# Patient Record
Sex: Female | Born: 1945
Health system: Southern US, Community
[De-identification: ages and names within clinical notes are randomized; demographics above are authoritative.]

## PROBLEM LIST (undated history)

## (undated) DIAGNOSIS — Z87442 Personal history of urinary calculi: Secondary | ICD-10-CM

## (undated) DIAGNOSIS — T4145XA Adverse effect of unspecified anesthetic, initial encounter: Secondary | ICD-10-CM

## (undated) DIAGNOSIS — Z860100 Personal history of colon polyps, unspecified: Secondary | ICD-10-CM

## (undated) DIAGNOSIS — J45909 Unspecified asthma, uncomplicated: Secondary | ICD-10-CM

## (undated) DIAGNOSIS — C4490 Unspecified malignant neoplasm of skin, unspecified: Secondary | ICD-10-CM

## (undated) DIAGNOSIS — C189 Malignant neoplasm of colon, unspecified: Secondary | ICD-10-CM

## (undated) DIAGNOSIS — Z1501 Genetic susceptibility to malignant neoplasm of breast: Secondary | ICD-10-CM

## (undated) DIAGNOSIS — H9190 Unspecified hearing loss, unspecified ear: Secondary | ICD-10-CM

## (undated) DIAGNOSIS — E119 Type 2 diabetes mellitus without complications: Secondary | ICD-10-CM

## (undated) DIAGNOSIS — Z79890 Hormone replacement therapy (postmenopausal): Secondary | ICD-10-CM

## (undated) DIAGNOSIS — H269 Unspecified cataract: Secondary | ICD-10-CM

## (undated) DIAGNOSIS — R51 Headache: Secondary | ICD-10-CM

## (undated) DIAGNOSIS — R011 Cardiac murmur, unspecified: Secondary | ICD-10-CM

## (undated) DIAGNOSIS — Z8041 Family history of malignant neoplasm of ovary: Secondary | ICD-10-CM

## (undated) DIAGNOSIS — Z803 Family history of malignant neoplasm of breast: Secondary | ICD-10-CM

## (undated) DIAGNOSIS — S92902A Unspecified fracture of left foot, initial encounter for closed fracture: Secondary | ICD-10-CM

## (undated) DIAGNOSIS — Z8781 Personal history of (healed) traumatic fracture: Secondary | ICD-10-CM

## (undated) DIAGNOSIS — Z1502 Genetic susceptibility to malignant neoplasm of ovary: Secondary | ICD-10-CM

## (undated) DIAGNOSIS — T8859XA Other complications of anesthesia, initial encounter: Secondary | ICD-10-CM

## (undated) DIAGNOSIS — Z8639 Personal history of other endocrine, nutritional and metabolic disease: Secondary | ICD-10-CM

## (undated) DIAGNOSIS — Z9221 Personal history of antineoplastic chemotherapy: Secondary | ICD-10-CM

## (undated) HISTORY — DX: Cardiac murmur, unspecified: R01.1

## (undated) HISTORY — DX: Unspecified hearing loss, unspecified ear: H91.90

## (undated) HISTORY — DX: Personal history of colon polyps, unspecified: Z86.0100

## (undated) HISTORY — DX: Family history of malignant neoplasm of breast: Z80.3

## (undated) HISTORY — DX: Personal history of (healed) traumatic fracture: Z87.81

## (undated) HISTORY — DX: Malignant neoplasm of colon, unspecified: C18.9

## (undated) HISTORY — DX: Hormone replacement therapy: Z79.890

## (undated) HISTORY — DX: Personal history of urinary calculi: Z87.442

## (undated) HISTORY — DX: Family history of malignant neoplasm of ovary: Z80.41

## (undated) HISTORY — DX: Unspecified fracture of left foot, initial encounter for closed fracture: S92.902A

## (undated) HISTORY — DX: Personal history of antineoplastic chemotherapy: Z92.21

## (undated) HISTORY — PX: COSMETIC SURGERY: SHX468

## (undated) HISTORY — DX: Genetic susceptibility to malignant neoplasm of breast: Z15.01

## (undated) HISTORY — DX: Personal history of other endocrine, nutritional and metabolic disease: Z86.39

## (undated) HISTORY — DX: Personal history of colonic polyps: Z86.010

## (undated) HISTORY — DX: Unspecified cataract: H26.9

## (undated) HISTORY — PX: OTHER SURGICAL HISTORY: SHX169

## (undated) HISTORY — DX: Genetic susceptibility to malignant neoplasm of ovary: Z15.02

---
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
## 1975-03-31 HISTORY — PX: DILATION AND CURETTAGE OF UTERUS: SHX78

## 1983-03-31 DIAGNOSIS — Z8781 Personal history of (healed) traumatic fracture: Secondary | ICD-10-CM

## 1983-03-31 HISTORY — PX: OTHER SURGICAL HISTORY: SHX169

## 1983-03-31 HISTORY — DX: Personal history of (healed) traumatic fracture: Z87.81

## 1991-03-31 HISTORY — PX: ANTERIOR CRUCIATE LIGAMENT REPAIR: SHX115

## 1999-04-23 ENCOUNTER — Other Ambulatory Visit: Admission: RE | Admit: 1999-04-23 | Discharge: 1999-04-23 | Payer: Self-pay | Admitting: Plastic Surgery

## 1999-04-23 ENCOUNTER — Encounter (INDEPENDENT_AMBULATORY_CARE_PROVIDER_SITE_OTHER): Payer: Self-pay | Admitting: Specialist

## 1999-07-07 ENCOUNTER — Other Ambulatory Visit: Admission: RE | Admit: 1999-07-07 | Discharge: 1999-07-07 | Payer: Self-pay | Admitting: Obstetrics and Gynecology

## 2000-08-18 ENCOUNTER — Ambulatory Visit (HOSPITAL_BASED_OUTPATIENT_CLINIC_OR_DEPARTMENT_OTHER): Admission: RE | Admit: 2000-08-18 | Discharge: 2000-08-18 | Payer: Self-pay | Admitting: Plastic Surgery

## 2000-08-19 ENCOUNTER — Other Ambulatory Visit: Admission: RE | Admit: 2000-08-19 | Discharge: 2000-08-19 | Payer: Self-pay | Admitting: Obstetrics and Gynecology

## 2001-03-20 ENCOUNTER — Encounter: Payer: Self-pay | Admitting: Internal Medicine

## 2001-09-05 ENCOUNTER — Other Ambulatory Visit: Admission: RE | Admit: 2001-09-05 | Discharge: 2001-09-05 | Payer: Self-pay | Admitting: Obstetrics and Gynecology

## 2002-12-11 ENCOUNTER — Other Ambulatory Visit: Admission: RE | Admit: 2002-12-11 | Discharge: 2002-12-11 | Payer: Self-pay | Admitting: Obstetrics and Gynecology

## 2003-03-31 HISTORY — PX: TOTAL ABDOMINAL HYSTERECTOMY: SHX209

## 2003-06-05 ENCOUNTER — Observation Stay (HOSPITAL_COMMUNITY): Admission: RE | Admit: 2003-06-05 | Discharge: 2003-06-06 | Payer: Self-pay | Admitting: Obstetrics and Gynecology

## 2003-06-05 ENCOUNTER — Encounter: Payer: Self-pay | Admitting: Internal Medicine

## 2003-06-05 ENCOUNTER — Encounter (INDEPENDENT_AMBULATORY_CARE_PROVIDER_SITE_OTHER): Payer: Self-pay | Admitting: *Deleted

## 2005-11-10 ENCOUNTER — Emergency Department (HOSPITAL_COMMUNITY): Admission: EM | Admit: 2005-11-10 | Discharge: 2005-11-10 | Payer: Self-pay | Admitting: Emergency Medicine

## 2008-09-01 ENCOUNTER — Emergency Department (HOSPITAL_COMMUNITY): Admission: EM | Admit: 2008-09-01 | Discharge: 2008-09-01 | Payer: Self-pay | Admitting: Emergency Medicine

## 2009-06-20 ENCOUNTER — Encounter: Payer: Self-pay | Admitting: Internal Medicine

## 2009-11-18 ENCOUNTER — Encounter (INDEPENDENT_AMBULATORY_CARE_PROVIDER_SITE_OTHER): Payer: Self-pay | Admitting: *Deleted

## 2010-01-20 ENCOUNTER — Ambulatory Visit: Payer: Self-pay | Admitting: Internal Medicine

## 2010-01-20 ENCOUNTER — Encounter (INDEPENDENT_AMBULATORY_CARE_PROVIDER_SITE_OTHER): Payer: Self-pay | Admitting: *Deleted

## 2010-03-04 ENCOUNTER — Ambulatory Visit: Payer: Self-pay | Admitting: Internal Medicine

## 2010-03-07 ENCOUNTER — Encounter: Payer: Self-pay | Admitting: Internal Medicine

## 2010-05-01 NOTE — Letter (Signed)
Summary: Pt's Medical Hx/Patient  Pt's Medical Hx/Patient   Imported By: Sherian Rein 01/27/2010 08:32:46  _____________________________________________________________________  External Attachment:    Type:   Image     Comment:   External Document  Appended Document: Colonoscopy   Colonoscopy  Procedure date:  03/04/2010  Findings:          1) Three polyps removed, largest 8 mm.     2) Moderate diverticulosis in the sigmoid colon     3) Otherwise normal examination   1. Colon, polyp(s), transverse and descending :  - TUBULAR ADENOMA (TWO FRAGMENTS) - HYPERPLASTIC POLYPS. - NO HIGH GRADE DYSPLASIA OR MALIGNANCY.  Comments:      Repeat colonoscopy in 5 years.   Procedures Next Due Date:    Colonoscopy: 02/2015   Appended Document: Colonoscopy     Procedures Next Due Date:    Colonoscopy: 02/2015

## 2010-05-01 NOTE — Letter (Signed)
Summary: New Patient letter  [facility]  [address] [place], [place] [address]   Phone: [phone]  Fax: [phone]       11/18/2009 MRN: [id number]  Anne [name] [address] [place], [place]  [address]  Dear Ms. Tooker,  Welcome to the Gastroenterology Division at [facility].    You are scheduled to see Dr.  Leone Payor on 01-20-10 at 9:00a.m. on the 3rd floor at [facility], [address]  We ask that you try to arrive at our office 15 minutes prior to your appointment time to allow for check-in.  We would like you to complete the enclosed self-administered evaluation form prior to your visit and bring it with you on the day of your appointment.  We will review it with you.  Also, please bring a complete list of all your medications or, if you prefer, bring the medication bottles and we will list them.  Please bring your insurance card so that we may make a copy of it.  If your insurance requires a referral to see a specialist, please bring your referral form from your primary care physician.  Co-payments are due at the time of your visit and may be paid by cash, check or credit card.     Your office visit will consist of a consult with your physician (includes a physical exam), any laboratory testing he/she may order, scheduling of any necessary diagnostic testing (e.g. x-ray, ultrasound, CT-scan), and scheduling of a procedure (e.g. Endoscopy, Colonoscopy) if required.  Please allow enough time on your schedule to allow for any/all of these possibilities.    If you cannot keep your appointment, please call [phone] to cancel or reschedule prior to your appointment date.  This allows [place] the opportunity to schedule an appointment for another patient in need of care.  If you do not cancel or reschedule by 5 p.m. the business day prior to your appointment date, you will be charged a $50.00 late cancellation/no-show fee.    Thank you for choosing  [facility] for your medical needs.  We appreciate the opportunity to care for you.  Please visit [place] at our website  to learn more about our practice.                     Sincerely,                                                             The Gastroenterology Division

## 2010-05-01 NOTE — Letter (Signed)
Summary: Patient Notice- Polyp Results  [facility]  [address] [place], [place] [address]   Phone: [phone]  Fax: [phone]        March 07, 2010 MRN: [id number]    [name] [address] [place], [place]  [address]    Dear Anne [name],  Two of the three polyps removed from your colon were adenomatous. This means that they were pre-cancerous or that  they had the potential to change into cancer over time. One polyp was hyperplastic and generally does not have cancer potential.  I recommend that you have a repeat colonoscopy in 5 years to determine if you have developed any new polyps over time and screen for colorectal cancer. If you develop any new rectal bleeding, abdominal pain or significant bowel habit changes, please contact us before then.  Please call us if you are having persistent problems or have questions about your condition that have not been fully answered at this time.  Sincerely,  Ashley Murrain MD, [facility]  This letter has been electronically signed by your physician.  Appended Document: Patient Notice- Polyp Results Letter mailed

## 2010-05-01 NOTE — Letter (Signed)
Summary: [facility] Instructions  [facility]  [address] [place], [place] [address]   Phone: [phone]  Fax: [phone]       CLEO SANTUCCI    1945/11/23    MRN: [id number]      Procedure Day Dorna Bloom: Jake Shark, 03/04/10     Arrival Time: 8:30 AM      Procedure Time: 9:30 AM    Location of Procedure:                    _X_  [name] (4th Floor)                    PREPARATION FOR COLONOSCOPY WITH MOVIPREP   Starting 5 days prior to your procedure 02/27/10 do not eat nuts, seeds, popcorn, corn, beans, peas,  salads, or any raw vegetables.  Do not take any fiber supplements (e.g. Metamucil, Citrucel, and Benefiber).  THE DAY BEFORE YOUR PROCEDURE         MONDAY, 03/03/10  1.  Drink clear liquids the entire day-NO SOLID FOOD  2.  Do not drink anything colored red or purple.  Avoid juices with pulp.  No orange juice.  3.  Drink at least 64 oz. (8 glasses) of fluid/clear liquids during the day to prevent dehydration and help the prep work efficiently.  CLEAR LIQUIDS INCLUDE: Water Jello Ice Popsicles Tea (sugar ok, no milk/cream) Powdered fruit flavored drinks Coffee (sugar ok, no milk/cream) Gatorade Juice: apple, white grape, white cranberry  Lemonade Clear bullion, consomm, broth Carbonated beverages (any kind) Strained chicken noodle soup Hard Candy                          4.  In the morning, mix first dose of MoviPrep solution:    Empty 1 Pouch A and 1 Pouch B into the disposable container    Add lukewarm drinking water to the top line of the container. Mix to dissolve    Refrigerate (mixed solution should be used within 24 hrs)  5.  Begin drinking the prep at 5:00 p.m. The MoviPrep container is divided by 4 marks.   Every 15 minutes drink the solution down to the next mark (approximately 8 oz) until the full liter is complete.   6.  Follow completed prep with 16 oz of clear liquid of your choice (Nothing red or purple).  Continue  to drink clear liquids.  7.  Mix second dose of MoviPrep solution:    Empty 1 Pouch A and 1 Pouch B into the disposable container    Add lukewarm drinking water to the top line of the container. Mix to dissolve    Refrigerate  Beginning at 9:00 p.m. (5 hours before procedure):         1. Every 15 minutes, drink the solution down to the next mark (approx 8 oz) until the full liter is complete.         2. Follow completed prep with 16 oz. of clear liquid of your choice.  THE DAY OF YOUR PROCEDURE      TUESDAY, 03/04/10   1. You may drink clear liquids until 7:30 AM (2 HOURS BEFORE PROCEDURE).  MEDICATION INSTRUCTIONS  Unless otherwise instructed, you should take regular prescription medications with a small sip of water   as early as possible the morning of your procedure.       OTHER INSTRUCTIONS  You will need a responsible  adult at least 67 years of age to accompany you and drive you home.   This person must remain in the waiting room during your procedure.  Wear loose fitting clothing that is easily removed.  Leave jewelry and other valuables at home.  However, you may wish to bring a book to read or  an iPod/MP3 player to listen to music as you wait for your procedure to start.  Remove all body piercing jewelry and leave at home.  Total time from sign-in until discharge is approximately 2-3 hours.  You should go home directly after your procedure and rest.  You can resume normal activities the  day after your procedure.  The day of your procedure you should not:   Drive   Make legal decisions   Operate machinery   Drink alcohol   Return to work  You will receive specific instructions about eating, activities and medications before you leave.   The above instructions have been reviewed and explained to me by   Francee Piccolo, CMA (AAMA)    I fully understand and can verbalize these instructions _____________________________ Date 01/20/10

## 2010-05-01 NOTE — Procedures (Signed)
Summary: Colonoscopy  Patient: [name] Note: All result statuses are Final unless otherwise noted.  Tests: (1) Colonoscopy (COL)   COL Colonoscopy           DONE     [facility]     [address]     [place], [place]  [address]           COLONOSCOPY PROCEDURE REPORT           PATIENT:  Anne [name], Anne [name]  MR#:  [id number]     BIRTHDATE:  Jul 29, 1945, 63 yrs. old  GENDER:  female     ENDOSCOPIST:  Iva Boop, MD, [facility]           PROCEDURE DATE:  03/04/2010     PROCEDURE:  Colonoscopy with snare polypectomy     ASA CLASS:  Class I     INDICATIONS:  Routine Risk Screening     MEDICATIONS:   Fentanyl 100 mcg IV Valium 10 milligrams IV           DESCRIPTION OF PROCEDURE:   After the risks benefits and     alternatives of the procedure were thoroughly explained, informed     consent was obtained.  Digital rectal exam was performed and     revealed no abnormalities.   The LB PCF-[id number] endoscope     was introduced through the anus and advanced to the cecum, which     was identified by both the appendix and ileocecal valve, without     limitations.  The quality of the prep was adequate, using     MoviPrep.  The instrument was then slowly withdrawn as the colon     was fully examined. Insertion: 11:47 minutes Withdrawal: 19:00     minutes     <<PROCEDUREIMAGES>>           FINDINGS:  Three polyps were found. Two transverse (8 mm and 4mm)     and descending (5mm). Polyps were snared without cautery.     Retrieval was successful. snare polyp  Moderate diverticulosis was     found in the sigmoid colon.  This was otherwise a normal     examination of the colon. Includes right colon retroflexion.     Retroflexed views in the rectum revealed no abnormalities.    The     scope was then withdrawn from the patient and the procedure     completed.           COMPLICATIONS:  None     ENDOSCOPIC IMPRESSION:     1) Three polyps removed, largest 8 mm.     2) Moderate  diverticulosis in the sigmoid colon     3) Otherwise normal examination           REPEAT EXAM:  In for Colonoscopy, pending biopsy results.           Iva Boop, MD, [facility]           CC:  The Patient     Floyde Parkins, MD           n.     Rosalie Doctor:   Iva Boop at 03/04/2010 10:56 AM           Virgel Paling, [id number]  Note: An exclamation mark (!) indicates a result that was not dispersed into the flowsheet. Document Creation Date: 03/04/2010 10:57 AM _______________________________________________________________________  (1) Order result status: Final Collection or observation date-time: 03/04/2010 10:48 Requested date-time:  Receipt date-time:  Reported date-time:  Referring Physician:   Ordering Physician: Stan Head [phone]) Specimen Source:  Source: Launa Grill Order Number: [id number] Lab site:   Appended Document: Colonoscopy   Colonoscopy  Procedure date:  03/04/2010  Findings:          1) Three polyps removed, largest 8 mm.     2) Moderate diverticulosis in the sigmoid colon     3) Otherwise normal examination   1. Colon, polyp(s), transverse and descending :  - TUBULAR ADENOMA (TWO FRAGMENTS) - HYPERPLASTIC POLYPS. - NO HIGH GRADE DYSPLASIA OR MALIGNANCY.  Comments:      Repeat colonoscopy in 5 years.   Procedures Next Due Date:    Colonoscopy: 02/2015   Appended Document: Colonoscopy     Procedures Next Due Date:    Colonoscopy: 02/2015

## 2010-05-01 NOTE — Assessment & Plan Note (Signed)
Summary: discuss colon//establish G.I.--ch.   History of Present Illness Visit Type: Initial Visit Primary GI MD: Stan Head MD [facility] Chief Complaint: Screening Colonoscopy. 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 She is here to discuss screening colonoscopy and past side effects from Versed.  She desctribes occasional bloating and constipation which is related to disruption of her routine when she travels.  She developed post-op terror, confusion and memory disturbance with Versed on at least 2 occasions.  Rare reflux.   GI Review of Systems    Reports acid reflux and  bloating.      Denies abdominal pain, belching, chest pain, dysphagia with liquids, dysphagia with solids, heartburn, loss of appetite, nausea, vomiting, vomiting blood, weight loss, and  weight gain.      Reports change in bowel habits and  constipation.     Denies anal fissure, black tarry stools, diarrhea, diverticulosis, fecal incontinence, heme positive stool, hemorrhoids, irritable bowel syndrome, jaundice, light color stool, liver problems, rectal bleeding, and  rectal pain.  Preventive Screening-Counseling & Management  Alcohol-Tobacco     Smoking Status: never      Drug Use:  no.      Current Medications (verified): 1)  Miralax  Powd (Polyethylene Glycol 3350) .... Mix One Capfull in 8 Ounce of Water and Drink 2-3 Times Per Week. 2)  Methyltest-Est Estrogens Hs 1.25-0.625 Mg Tabs (Est Estrogens-Methyltest) .... Take One By Mouth Once Daily 3)  Premarin 0.45 Mg Tabs (Estrogens Conjugated) .... Take One By Mouth Once Daily 4)  Calcium-Vitamin D 600-200 Mg-Unit Tabs (Calcium-Vitamin D) .... Take One By Mouth Two Times A Day 5)  Multivitamins  Tabs (Multiple Vitamin) .... Take One By Mouth Two Times A Day 6)  Allegra Allergy 180 Mg Tabs (Fexofenadine Hcl) .... Take One By Mouth Once Daily 7)  Pataday 0.2 % Soln (Olopatadine Hcl) .... One Drop in Each  Eye  Allergies (verified): 1)  ! Versed 2)  ! * Tallin 3)  ! Neosporin 4)  ! 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 Divorced one daughter and one son. Patient has never smoked.  Alcohol Use - yes 2-3 per week  Daily Caffeine Use 1-2 per day Illicit Drug Use - no Smoking Status:  never Drug Use:  no  Review of Systems       The patient complains of allergy/sinus and heart murmur.         All other ROS negative except as per HPI.   Vital Signs:  Patient profile:   65 year old female Height:      64.5 inches Weight:      176.4 pounds BMI:     29.92 Pulse rate:   60 / minute Pulse rhythm:   regular BP sitting:   150 / 68  (left arm) Cuff size:   regular  Vitals Entered By: Harlow Mares CMA Duncan Dull) (January 20, 2010 8:50 AM)  Physical Exam  General:  Well developed,  well nourished, no acute distress. Additional Exam:  Further exam deferred to pre-colonoscopy exam.  Labs 05/2409: CBC, CMP, Lipids, TSH reviewed K+ 3.1 and glucose 109 otherwise normal  Impression & Recommendations:  Problem # 1:  SPECIAL SCREENING FOR MALIGNANT NEOPLASMS COLON (ICD-V76.51) Assessment New unable to use Versed due to side effects will try Fentanyl, Benadryl, Valium Risks, benefits,and indications of endoscopic procedure(s) were reviewed with the patient and all  questions answered.   Orders: Colonoscopy (Colon)  Patient Instructions: 1)  Please pick up your medications at your pharmacy. MOVIPREP 2)  We will see you at your procedure on 03/04/10. 3)  We will forward a copy of today's note to you when it is complete. 4)  [facility] Patient Information Guide given to patient.  5)  Colonoscopy and Flexible Sigmoidoscopy brochure given.  6)  Copy sent to : Floyde Parkins, MD 7)  The medication list was reviewed and reconciled.  All changed / newly prescribed medications were explained.  A complete medication list was provided to the patient / caregiver. Prescriptions: MOVIPREP 100 GM  SOLR (PEG-KCL-NACL-NASULF-NA ASC-C) As per prep instructions.  #1 x 0   Entered by:   Francee Piccolo CMA (AAMA)   Authorized by:   Iva Boop MD, [facility]   Signed by:   Francee Piccolo CMA (AAMA) on 01/20/2010   Method used:   Electronically to        [facility] (retail)       [address]       [place], [place]  [id number]       Ph: [phone] or [phone]       Fax: [phone]   RxID:   [id number]

## 2010-06-20 ENCOUNTER — Other Ambulatory Visit: Payer: Self-pay | Admitting: Dermatology

## 2010-08-15 NOTE — Op Note (Signed)
NAME:  Anne [name], Anne [name]                         ACCOUNT NO.:  [id number]   MEDICAL RECORD NO.:  [id number]                   PATIENT TYPE:  OBV   LOCATION:  9399                                 FACILITY:  [facility]   PHYSICIAN:  Sherry A. Rosalio Macadamia, M.D.           DATE OF BIRTH:  Mar 10, 1946   DATE OF PROCEDURE:  06/05/2003  DATE OF DISCHARGE:                                 OPERATIVE REPORT   PREOPERATIVE DIAGNOSES:  1. Cystocele.  2. Left hydrosalpinx.   POSTOPERATIVE DIAGNOSES:  1. Third degree cystocele.  2. Pelvic congestion syndrome.   PROCEDURE:  Laparoscopically-assisted vaginal hysterectomy, anterior repair.   SURGEONS:  Sherry A. Rosalio Macadamia, M.D., Lenoard Aden, M.D.   ANESTHESIA:  General.   INDICATIONS:  This is a 65 year old G2, P2-0-0-2, woman who has had a  symptomatic cystocele for several years.  The patient complains of vaginal  pressure and tissue dropping out of the vagina.  The patient has also had a  known left hydrosalpinx, followed since April 2001 by ultrasound.  Therefore, the patient is brought to the operating room for an LAVH and  anterior repair.   FINDINGS:  A normal-sized anteflexed uterus, normal tubes and ovaries.  Large veins throughout the pelvis.   DESCRIPTION OF PROCEDURE:  The patient was brought into the operating room  and given adequate general anesthesia.  She was placed in a dorsal lithotomy  position.  Her abdomen was washed with Hibiclens, the vagina was washed with  Hibiclens and a Foley catheter was placed in the bladder.  The patient was  draped in a sterile fashion.  The subumbilical incision was infiltrated with  0.25% Marcaine.  An incision was made.  The fascia was identified.  It was  grasped and was incised using 0 Vicryl.  A pursestring stitch was taken  around the fascia.  The peritoneum was identified and entered bluntly.  The  Hasson trocar was introduced into the peritoneal cavity and cinched down  with the 0 Vicryl  suture.  Carbon dioxide was insufflated.  Lateral  incisions were made after infiltrating with 0.25% Marcaine and incision made  under direct visualization, 5 mm trocars were placed.  The pelvis was  inspected with the above findings.  Using tripolar cautery, the right  infundibulopelvic ligament was cauterized x3 and cut in the middle.  The  tissues underneath the IP were cauterized and cut over to the round  ligament.  Round ligament was cauterized x3 and cut.  The tissues were  dissected and the right uterine area was cauterized without cutting;  however, there was a small amount of bleeding.  Because of this, re-cautery  was performed.  The same procedure was performed on the left.  Both ureters  had been identified well below this area of surgery.  The bladder flap was  again put in the midline and a small incision made.  This was hydrodissected  with the Nezhat.  The bladder peritoneum was incised on both sides and the  bladder was developed off of the lower uterine segment with some blunt and  sharp dissection.  Then the surgery was moved to the vaginal part.  A  weighted speculum was placed in the vagina.  The cervix was infiltrated with  1% Xylocaine with epinephrine circumferentially.  The cervix was circumcised  and the vaginal mucosa was dissected off of the cervix with blunt  dissection.  The posterior cul-de-sac was entered.  This was opened.  Using  Heaney clamps, the uterosacral ligaments were clamped, cut, and suture  ligated with 0 Vicryl LigaSure.  The posterior cuff was closed with 0 Vicryl  in a running locked stitch for hemostasis.  A weighted speculum was placed  within the space.  The cardinal ligaments were clamped and cauterized with  LigaSure on alternating sides and the tissues were dissected.  The uterus,  tubes, and ovaries were removed in this fashion.  Small bleeders were  present by the uterosacral ligaments.  These were closed with 0 Vicryl in a   figure-of-eight stitch.  Peritoneum was identified and closed with 0 Vicryl  in a pursestring stitch.  The cystocele repair was then performed by  identifying the vaginal mucosa.  It was dissected off of the bladder with  blunt and sharp dissection.  A Kelly plication stitch was taken with 2-0  Vicryl right at the UV junction.  The connective tissue over the bladder was  identified by dissecting the vaginal tissues off the bladder.  These tissues  were then closed with 2-0 Vicryl in mattress-type stitches for good support  to the bladder.  The excess vaginal tissue was excised.  The vaginal mucosa  was closed in the midline with 3-0 chromic in a running locked stitch.  The  vaginal cuff was closed with 0 Vicryl figure-of-eight interrupted stitches.  The vagina was packed with plain gauze with Estrace cream.  The patient was  taken out of the steep Trendelenburg position.  The surgeon's gloves were  changed.  The laparoscope was reintroduced into the abdominal cavity,  pictures were taken.  The pelvis was irrigated.  Adequate hemostasis was  present.  All carbon dioxide was allowed to escape.  The lower instruments  were removed under direct visualization.  The laparoscope was removed.  The  fascia was then closed over a finger with the 0 Vicryl pursestring stitch  that was present, and then the patient was taken out of the dorsal lithotomy  position and prepared for the second part of her procedure, which is an  abdominoplasty with Dr. Stephens November.   COMPLICATIONS:  None.   CONDITION:  Stable.   ESTIMATED BLOOD LOSS:  175 mL.   SPECIMENS:  Uterus, tubes, and ovaries.                                               Sherry A. Rosalio Macadamia, M.D.    SAD/MEDQ  D:  06/05/2003  T:  06/05/2003  Job:  [id number]   cc:   Consuello Bossier., M.D.  [address]  [address]  [place]  [place] [address]  Fax: [phone]

## 2010-08-15 NOTE — Op Note (Signed)
NAME:  Anne [name], Anne [name]                         ACCOUNT NO.:  [id number]   MEDICAL RECORD NO.:  [id number]                   PATIENT TYPE:  OBV   LOCATION:  9399                                 FACILITY:  [facility]   PHYSICIAN:  Consuello Bossier., M.D.         DATE OF BIRTH:  08-Jul-1945   DATE OF PROCEDURE:  06/05/2003  DATE OF DISCHARGE:                                 OPERATIVE REPORT   PREOPERATIVE DIAGNOSIS:  Abdominal elastosis and diastasis recti.   POSTOPERATIVE DIAGNOSIS:  Abdominal elastosis and diastasis recti.   OPERATION:  Abdominoplasty and repair of the diastasis recti.   SURGEON:  Pleas Patricia, M.D.   ANESTHESIA:  General endotracheal.   FINDINGS:  Please see Dr. Cordelia Pen Dickstein's operative note for her  gynecological procedure.  The patient had two trocar entry wounds in the  lower abdomen in the area of skin to be removed, and a lower periumbilical  incision made.  I had marked her off for the planned extended Pfannenstiel  incision.  She had previously been prepped with Betadine and draped  sterilely, but she was all reprepped and redraped.  The lower abdominal  lengthy transverse incision was made and continued down until the rectus and  external oblique fascia were encountered.  The dissection was then continued  at the level just above the fascia up to the umbilicus when this lower  abdominal skin was incised in the midline up to the umbilicus and then  continued upward, particularly in the midline going up toward the  xiphisternum.  Bleeding controlled with the electrocautery unit, and there  was noted to be hemostasis.  There was a diastasis recti measuring 4-5 cm in  diameter, which was repaired by interrupted 0 Prolene sutures placed in a  figure-of-eight fashion.  Two 10 mm Blake drains were placed on either side  of the umbilicus and brought out through separate stab wounds inferiorly.  Monocryl 3-0 sutures were placed in the midline and then a  chevron-shaped  incision made overlying the umbilicus, and it was brought through this  incision and sewn in place with an interrupted subcutaneous 3-0 Monocryl.  Large triangular segments of lower abdominal full-thickness tissue were then  excised, and again bleeding was controlled with electrocautery unit.  The  wound was irrigated with normal saline.  There was noted to be hemostasis.  The lower abdominal and transverse wounds were then closed with interrupted  subcutaneus 3-0 Monocryl, followed by a running subcuticular 4-0 Monocryl.  These wounds were further reinforced by the application of Steri-Strips.  Xeroform, 4 x 8's, ABD, and a Hypafix dressing were then applied.  The  patient tolerated the procedure well, was able to be discharged from the  operating room to the recovery room subsequently to be admitted for  overnight observation.  Consuello Bossier., M.D.    Tery Sanfilippo  D:  06/05/2003  T:  06/05/2003  Job:  [id number]

## 2010-08-29 DIAGNOSIS — Z87442 Personal history of urinary calculi: Secondary | ICD-10-CM

## 2010-08-29 HISTORY — DX: Personal history of urinary calculi: Z87.442

## 2010-11-06 ENCOUNTER — Other Ambulatory Visit: Payer: Self-pay | Admitting: Dermatology

## 2010-12-10 ENCOUNTER — Other Ambulatory Visit: Payer: Self-pay | Admitting: Dermatology

## 2011-02-20 ENCOUNTER — Encounter: Payer: Self-pay | Admitting: Internal Medicine

## 2011-05-14 ENCOUNTER — Other Ambulatory Visit: Payer: Self-pay | Admitting: Dermatology

## 2011-07-01 ENCOUNTER — Encounter: Payer: Self-pay | Admitting: Internal Medicine

## 2011-07-01 ENCOUNTER — Ambulatory Visit (INDEPENDENT_AMBULATORY_CARE_PROVIDER_SITE_OTHER): Payer: 59 | Admitting: Internal Medicine

## 2011-07-01 VITALS — BP 140/86 | HR 98 | Temp 98.7°F | Ht 64.25 in | Wt 166.0 lb

## 2011-07-01 DIAGNOSIS — H9190 Unspecified hearing loss, unspecified ear: Secondary | ICD-10-CM

## 2011-07-01 DIAGNOSIS — Z79890 Hormone replacement therapy (postmenopausal): Secondary | ICD-10-CM

## 2011-07-01 DIAGNOSIS — R079 Chest pain, unspecified: Secondary | ICD-10-CM

## 2011-07-01 DIAGNOSIS — H269 Unspecified cataract: Secondary | ICD-10-CM

## 2011-07-01 DIAGNOSIS — H918X9 Other specified hearing loss, unspecified ear: Secondary | ICD-10-CM

## 2011-07-01 DIAGNOSIS — Z8639 Personal history of other endocrine, nutritional and metabolic disease: Secondary | ICD-10-CM

## 2011-07-01 DIAGNOSIS — Z860100 Personal history of colon polyps, unspecified: Secondary | ICD-10-CM

## 2011-07-01 DIAGNOSIS — R011 Cardiac murmur, unspecified: Secondary | ICD-10-CM

## 2011-07-01 DIAGNOSIS — Z862 Personal history of diseases of the blood and blood-forming organs and certain disorders involving the immune mechanism: Secondary | ICD-10-CM

## 2011-07-01 HISTORY — DX: Personal history of other endocrine, nutritional and metabolic disease: Z86.39

## 2011-07-01 HISTORY — DX: Hormone replacement therapy: Z79.890

## 2011-07-01 HISTORY — DX: Cardiac murmur, unspecified: R01.1

## 2011-07-01 NOTE — Patient Instructions (Addendum)
Continue lifestyle intervention healthy eating and exercise . And metformin.  Will plan  Echo cardiogram and have Dr Daleen Squibb to see you about the murmur and the atypical chest pain.   Will review labs when available.   monitor BP readings to ensure  Below 140/90 .  FU depending on need   6-12 months.

## 2011-07-01 NOTE — Progress Notes (Signed)
Subjective:    Patient ID: Anne [name], female    DOB: 11-19-1945, 66 y.o.   MRN: [id number]  HPI  Patient comes in as new patient visit . Previous care was  Through her specialty care  . She is establishing with PCP. She is generally well and working on  Losing weight   Over time and has been successful so far . She does   Walking;  Gym and 3 x per week  60- 90 minutes .  Diet and yogurt and fruit diet. Recetly her mom has had a fall and  Is in rehab.  Has some concerns about  Some chest discomfort . " Not pain.  "  Left parasternal and ache  Lasts  Minutes.   In ams getting ready for work.   No asociated sx .  And able to exercise  No  associated sx .   Exercise tolerance  .  Hx of heart murmur heard and over 10 years ago had reportedly normal echo.   Bp : Usually 120 over 74  Range.   Has doctors  [facility].  Labs but not back yet    HRT  : On for 10 years.  Per dr Rosalio Macadamia . NOt interested in DCing unless medically necessary  Has been on metformin  For a few years   Place on by her GYNE in the past for some hyperglycemia fasting but no dm.    Family hx of dm  and weight issues.   Hx of  fbs 109.tolerates well.   Stools   Some changing after  Colonoscopy.   Hx of polyps.   Had  Poss divertilitis. Sx    about in december and rx with cipro  .     No blood or pain from them.   Self rx.   Hemoccult negative.  Sees Dr Leone Payor. Review of Systems  ROS:  GEN/ HEENT: No fever, significant weight changes sweats headaches vision problems hearing has high freq hearing loss and early cataracts  CV/ PULM; No  shortness of breath cough, syncope,edema  change in exercise tolerance. GI /GU: No adominal pain, vomiting,. No blood in the stool. No significant GU symptoms. SKIN/HEME: ,no acute skin rashes suspicious lesions or bleeding. No lymphadenopathy, nodules, masses.  NEURO/ PSYCH:  No neurologic signs such as weakness numbness. No depression anxiety. IMM/ Allergy: No unusual infections.  Allergy .    REST of 12 system review negative except as per HPI  Past history family history social history  Personally entered  in the electronic medical record.  Past Medical History  Diagnosis Date  . Heart murmur, systolic 07/01/2011  . Postmenopausal HRT (hormone replacement therapy) 07/01/2011    surgical menopause  . Hx of hyperglycemia 07/01/2011  . Hx of compression fracture of spine 1985    t12 after a  20 foot fall  . Hx of fracture of wrist     after fall   . 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 Hx of colonic polyps   . Early cataract     stoneburner   . High frequency hearing loss     kraus followed     History   Social History  . Marital Status: Divorced    Spouse Name: N/A    Number of Children: N/A  . Years of Education: N/A   Occupational History  . Not on file.   Social History Main Topics  . Smoking status: Never Smoker   .  Smokeless tobacco: Never Used  . Alcohol Use: 1.0 oz/week    2 drink(s) per week  . Drug Use: No  . Sexually Active: Not on file   Other Topics Concern  . 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 Tonsillectomy 1952  . Adenoidectomy 1952  . Wrist surgery 1957    left reduction  . Compression fraction 1985    T12  . Anterior cruciate ligament repair 1993    right Murphy  . Cosmetic surgery 1999-2007    holderness blepharoplasty facial rhytidectomy mastopexy abdominoplasty   . Dental implant   . Dilation and curettage of uterus 1977  . Total abdominal hysterectomy 2005    with cystocele repair     Family History  Problem Relation Age of Onset  . Diabetes Father   . Cancer Father     oral  . Thyroid disease Father   . Heart attack Father     died age 68  . Dementia Mother   . Hypertension Mother   . Hyperlipidemia Mother   .  Cancer Brother     retro lymphosarcoma agent orange  . Cancer Sister     clear cell ovarina and breast insitu  . Obesity      siblings   . Diabetes Brother   . Alcohol abuse Brother     hx of  . Other      arrythmia   . Hypertension Sister   . Asthma Sister     Allergies  Allergen Reactions  . Midazolam     REACTION: terrors, panic  . Triple Antibiotic     REACTION: rash  . Zolpidem Tartrate     REACTION: shaking, chills    Current Outpatient Prescriptions on File Prior to Visit  Medication Sig Dispense Refill  . Calcium Carbonate-Vitamin D (CALTRATE 600+D PO) Take by mouth daily.      Marland Kitchen estrogens, conjugated, (PREMARIN) 0.3 MG tablet Take 0.3 mg by mouth daily. Take daily for 21 days then do not take for 7 days.      . metFORMIN (GLUCOPHAGE) 500 MG tablet Take 500 mg by mouth 2 (two) times daily with a meal.      . METHYLTESTOSTERONE PO Take 1.25 mg by mouth daily.           Objective:   Physical Exam  BP 140/86  Pulse 98  Temp 98.7 F (37.1 C)  Ht 5' 4.25" (1.632 m)  Wt 166 lb (75.297 kg)  BMI 28.27 kg/m2  SpO2 97%  WDWN in nad HEENT: Normocephalic ;atraumatic , Eyes;  PERRL, EOMs  Full, lids and conjunctiva clear,,Ears: no deformities, canals nl, TM landmarks normal, Nose: no deformity or discharge  Mouth : OP clear without lesion or edema . Neck: Supple without adenopathy or masses or bruits Chest:  Clear to A&P without wheezes rales or rhonchi CV:  S1-S2 no gallops  peripheral perfusion is normal  2/6 SYST m left sternal border and left ant axillary line  No click heard and no radaition to neck. No clubbing cyanosis or edema Abdomen:  Sof,t normal bowel sounds without hepatosplenomegaly, no guarding rebound or masses no CVA tenderness Skin: normal capillary refill ,turgor , color:  Multiple ak changes in  Distal legs.  No acute rashes ,petechiae or bruising Oriented  x 3 and no noted deficits in memory, attention, and speech. NEURO: oriented x 3 CN 3-12  appear intact. No focal muscle weakness or atrophy. DTRs symmetrical. Gait WNL.  Grossly non focal. No tremor or abnormal movement. Noted   Reviewed  Pat Hx data presented   EKG NSR      Assessment & Plan:  Atypical chest pain felt to be some stress without associated sx and good exercise tolerance.  Murmur with hx of same  advise  characterize with echo . Plan consult Dr Daleen Squibb as to the advisability of further  Risk assessment because of family risk hx and age.  Will get u s copy of recent labs from doctors day.   Elevated BP today  Ensure normalcy out of office.  HRT plans on continuing  Fam hx of obesity and dm   Continue lifestyle intervention healthy eating and exercise .

## 2011-07-05 ENCOUNTER — Encounter: Payer: Self-pay | Admitting: Internal Medicine

## 2011-07-05 DIAGNOSIS — Z86010 Personal history of colonic polyps: Secondary | ICD-10-CM | POA: Insufficient documentation

## 2011-07-05 DIAGNOSIS — H269 Unspecified cataract: Secondary | ICD-10-CM | POA: Insufficient documentation

## 2011-07-08 ENCOUNTER — Encounter: Payer: Self-pay | Admitting: Cardiology

## 2011-07-08 ENCOUNTER — Ambulatory Visit (HOSPITAL_COMMUNITY): Payer: 59 | Attending: Cardiology

## 2011-07-08 ENCOUNTER — Ambulatory Visit (INDEPENDENT_AMBULATORY_CARE_PROVIDER_SITE_OTHER): Payer: 59 | Admitting: Cardiology

## 2011-07-08 ENCOUNTER — Other Ambulatory Visit: Payer: Self-pay

## 2011-07-08 VITALS — BP 134/72 | HR 78 | Ht 65.0 in | Wt 160.1 lb

## 2011-07-08 DIAGNOSIS — R079 Chest pain, unspecified: Secondary | ICD-10-CM

## 2011-07-08 DIAGNOSIS — Z79890 Hormone replacement therapy (postmenopausal): Secondary | ICD-10-CM

## 2011-07-08 DIAGNOSIS — R072 Precordial pain: Secondary | ICD-10-CM | POA: Insufficient documentation

## 2011-07-08 DIAGNOSIS — Z8639 Personal history of other endocrine, nutritional and metabolic disease: Secondary | ICD-10-CM

## 2011-07-08 DIAGNOSIS — I080 Rheumatic disorders of both mitral and aortic valves: Secondary | ICD-10-CM | POA: Insufficient documentation

## 2011-07-08 DIAGNOSIS — I517 Cardiomegaly: Secondary | ICD-10-CM | POA: Insufficient documentation

## 2011-07-08 DIAGNOSIS — R011 Cardiac murmur, unspecified: Secondary | ICD-10-CM

## 2011-07-08 NOTE — Assessment & Plan Note (Signed)
A preliminary echo report showed some mild mitral regurgitation and some calcification of the mitral annulus. The left atrium is normal left ventricular chamber size is normal with a normal ejection fraction. Reviewed with patient. No further evaluation necessary. I will see her back when necessary.

## 2011-07-08 NOTE — Patient Instructions (Signed)
Your physician recommends that you continue on your current medications as directed. Please refer to the Current Medication list given to you today.  Your physician recommends that you schedule a follow-up appointment as needed with Dr. Wall.  

## 2011-07-08 NOTE — Assessment & Plan Note (Signed)
Noncardiac. Reassurance given. Increase exercise to reduce stress.

## 2011-07-08 NOTE — Progress Notes (Signed)
HPI [name] is referred today by Dr.Panosh for evaluation of chest discomfort and a systolic murmur she said since she was a child.  Her chest discomfort as well localized in the left lower sternal border. It is not associated with exertion. There no other associated symptoms. It is not made worse by exercise. She denies any dyspnea on exertion, orthopnea, palpitations, or edema.  He's had a murmur since she was young. She denies any history of rheumatic heart disease or other systemic illness.  Past Medical History  Diagnosis Date  . Heart murmur, systolic 07/01/2011  . Postmenopausal HRT (hormone replacement therapy) 07/01/2011    surgical menopause  . Hx of hyperglycemia 07/01/2011  . Hx of compression fracture of spine 1985    t12 after a  20 foot fall  . Hx of fracture of wrist     after fall   . 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 Hx of colonic polyps   . Early cataract     stoneburner   . High frequency hearing loss     [facility]raus followed     Current Outpatient Prescriptions  Medication Sig Dispense Refill  . Calcium Carbonate-Vitamin D (CALTRATE 600+D PO) Take by mouth daily.      Marland Kitchen estrogens, conjugated, (PREMARIN) 0.3 MG tablet Take 0.3 mg by mouth daily. Take daily for 21 days then do not take for 7 days.      . metFORMIN (GLUCOPHAGE) 500 MG tablet Take 500 mg by mouth 2 (two) times daily with a meal.      . METHYLTESTOSTERONE PO Take 1.25 mg by mouth daily.      . Multiple Vitamin (MULTIVITAMIN) tablet Take 1 tablet by mouth daily. Senior      . Olopatadine HCl (PATADAY) 0.2 % SOLN Apply 1 drop to eye as needed.        Allergies  Allergen Reactions  . Midazolam     REACTION: terrors, panic  . Triple Antibiotic     REACTION: rash  . Zolpidem Tartrate     REACTION: shaking, chills    Family History  Problem Relation Age of Onset  . Diabetes Father   . Cancer Father     oral  . Thyroid disease Father   . Heart attack Father     died age 31  . Dementia  Mother   . Hypertension Mother   . Hyperlipidemia Mother   . Cancer Brother     retro lymphosarcoma agent orange  . Cancer Sister     clear cell ovarina and breast insitu  . Obesity      siblings   . Diabetes Brother   . Alcohol abuse Brother     hx of  . Other      arrythmia   . Hypertension Sister   . Asthma Sister     History   Social History  . Marital Status: Divorced    Spouse Name: N/A    Number of Children: N/A  . Years of Education: N/A   Occupational History  . Not on file.   Social History Main Topics  . Smoking status: Never Smoker   . Smokeless tobacco: Never Used  . Alcohol Use: 1.0 oz/week    2 drink(s) per week  . Drug Use: No  . Sexually Active: Not on file   Other Topics Concern  . 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 No diastolic component. No click heard. Lung: clear to ausculation and percussion Vascular: all pulses full without bruits  Abdominal: nondistended, nontender, good bowel sounds, no HSM, no bruits Extremities: no cyanosis, clubbing or edema, no sign of DVT, no varicosities  Skin: normal color, no rashes Neuro: alert and oriented x 3, non-focal Pysch: normal affect  EKG EKG reviewed from primary care office is normal. BMET No results found for this basename: na, k, cl, co2, glucose, bun, creatinine, calcium, gfrnonaa, gfraa    Lipid Panel  No results found for this basename: chol, trig, hdl, cholhdl, vldl,  ldlcalc    CBC No results found for this basename: wbc, rbc, hgb, hct, plt, mcv, mch, mchc, rdw, neutrabs, lymphsabs, monoabs, eosabs, basosabs

## 2011-07-10 NOTE — Progress Notes (Signed)
Quick Note:  Left a message for pt to return call. ______ 

## 2011-07-13 NOTE — Progress Notes (Signed)
Quick Note:  Pt states Dr. Daleen Squibb gave her the 2D results. Pt is aware to make an appt for follow up lab work. Labs ordered. ______

## 2011-07-20 ENCOUNTER — Other Ambulatory Visit (INDEPENDENT_AMBULATORY_CARE_PROVIDER_SITE_OTHER): Payer: 59

## 2011-07-20 DIAGNOSIS — Z8639 Personal history of other endocrine, nutritional and metabolic disease: Secondary | ICD-10-CM

## 2011-07-20 DIAGNOSIS — Z862 Personal history of diseases of the blood and blood-forming organs and certain disorders involving the immune mechanism: Secondary | ICD-10-CM

## 2011-07-22 ENCOUNTER — Telehealth: Payer: Self-pay | Admitting: *Deleted

## 2011-07-22 MED ORDER — METFORMIN HCL 1000 MG PO TABS: 1000.0000 mg | ORAL_TABLET | Freq: Two times a day (BID) | ORAL | Status: DC

## 2011-07-22 NOTE — Telephone Encounter (Signed)
Patient is aware of her A1c results.  Patient has agreed to take Metformin 1000mg  twice daily.  She would also like to speak with Dr Fabian Sharp if possible.  Her cell number is [phone].  Rx for metformin sent to pharmacy.

## 2011-07-23 ENCOUNTER — Telehealth: Payer: Self-pay

## 2011-07-23 DIAGNOSIS — E119 Type 2 diabetes mellitus without complications: Secondary | ICD-10-CM

## 2011-07-23 MED ORDER — SITAGLIPTIN PHOSPHATE 100 MG PO TABS: 100.0000 mg | ORAL_TABLET | Freq: Every day | ORAL | Status: DC

## 2011-07-23 NOTE — Telephone Encounter (Signed)
Pt called and states she was given her lab results by Dr. Fabian Sharp and pt is calling to communicate that she would like to take Januiva 100 mg.  Pt would like rx sent to [facility].  Pls advise.

## 2011-07-23 NOTE — Telephone Encounter (Signed)
Spoke with patient disc meds   Will rx januvia  and stay on metformin  She will continue  Lower carb [employer] type diet .   Check bg 3 x per week fasting and 3 x per week PP. Call for lab appt end of July  For hg a1c and then plan fu.   She will contact us if we need to send in rx for machine and strips.

## 2011-07-24 ENCOUNTER — Telehealth: Payer: Self-pay

## 2011-07-24 MED ORDER — GLUCOSE BLOOD VI STRP: ORAL_STRIP | Status: AC

## 2011-07-24 MED ORDER — BAYER MICROLET LANCETS MISC: Status: AC

## 2011-07-24 NOTE — Telephone Encounter (Signed)
Pt called and states that she needs a Contour monitor along with strips and lancets sent to [facility] because pt can get these for free.  Called and spoke with pharmacist and he states pt has received meter but needed strips and lancets.  Strips and lancets sent to [facility].  Called pt to make aware.

## 2011-11-11 ENCOUNTER — Other Ambulatory Visit: Payer: Self-pay | Admitting: Dermatology

## 2011-11-14 ENCOUNTER — Other Ambulatory Visit: Payer: Self-pay | Admitting: Internal Medicine

## 2012-01-15 ENCOUNTER — Other Ambulatory Visit: Payer: Self-pay | Admitting: Dermatology

## 2012-01-15 ENCOUNTER — Other Ambulatory Visit: Payer: Self-pay | Admitting: Internal Medicine

## 2012-05-17 ENCOUNTER — Other Ambulatory Visit: Payer: Self-pay | Admitting: Dermatology

## 2012-06-13 ENCOUNTER — Other Ambulatory Visit: Payer: Self-pay | Admitting: Internal Medicine

## 2012-07-15 ENCOUNTER — Other Ambulatory Visit: Payer: Self-pay | Admitting: Internal Medicine

## 2012-07-18 ENCOUNTER — Encounter: Payer: Self-pay | Admitting: Family Medicine

## 2012-07-18 ENCOUNTER — Telehealth: Payer: Self-pay | Admitting: Family Medicine

## 2012-07-18 NOTE — Telephone Encounter (Signed)
I have not spoke to the pt.  She will need an appt with [facility].  Not seen in 1 year.  Please make appt with the pt.  Thanks!!

## 2012-07-19 NOTE — Telephone Encounter (Signed)
appt made/kh 

## 2012-08-19 ENCOUNTER — Other Ambulatory Visit: Payer: Self-pay | Admitting: Internal Medicine

## 2012-09-14 ENCOUNTER — Other Ambulatory Visit: Payer: Self-pay | Admitting: Dermatology

## 2012-09-17 ENCOUNTER — Other Ambulatory Visit: Payer: Self-pay | Admitting: Internal Medicine

## 2012-09-20 NOTE — Telephone Encounter (Signed)
Reviewed record regarding this med request.     we do not have a fu  For her A1C . Need Fu labs tests   And fu. ( i see one for 6.8 at her [facility]  Dr Algie Coffer office )   Please contact and arrange with Dr Ferd Glassing to get labs Hg a1c , bmp lipids lfts, urine microalbumin  Cr ratio  Anda  FU visit . Unless done elsewhere  If seeing a specialist then kindly forward the  Information and evaluation.   Ok to refill medications until this is all arranged .  ( can refill x 90 days)

## 2012-09-20 NOTE — Telephone Encounter (Signed)
Patient not seen since 06/2011.  Please advise.  Thanks!!

## 2012-09-28 ENCOUNTER — Other Ambulatory Visit: Payer: Self-pay | Admitting: Family Medicine

## 2012-09-28 ENCOUNTER — Telehealth: Payer: Self-pay | Admitting: Family Medicine

## 2012-09-28 DIAGNOSIS — R7303 Prediabetes: Secondary | ICD-10-CM

## 2012-09-28 MED ORDER — METFORMIN HCL 1000 MG PO TABS: ORAL_TABLET | ORAL | Status: DC

## 2012-09-28 MED ORDER — SITAGLIPTIN PHOSPHATE 100 MG PO TABS: ORAL_TABLET | ORAL | Status: DC

## 2012-09-28 NOTE — Telephone Encounter (Signed)
This patient needs a lab and follow up appt with [facility].  I have placed the labs in the system.  She may not need an A1C if done recently.  Please check with the pt.  Refilled patient's medications for 90 days.  Please schedule both appointments with the pt.  Thanks!

## 2012-09-29 NOTE — Telephone Encounter (Signed)
Pt is out of town will call back next wk

## 2012-10-03 NOTE — Telephone Encounter (Signed)
a1c added on 7/2

## 2012-10-03 NOTE — Telephone Encounter (Signed)
Pt is sch for cpx in July 28 and cpx labs on 10-17-12. Can I added alc to cpx labs?

## 2012-10-17 ENCOUNTER — Other Ambulatory Visit (INDEPENDENT_AMBULATORY_CARE_PROVIDER_SITE_OTHER): Payer: 59

## 2012-10-17 DIAGNOSIS — R7309 Other abnormal glucose: Secondary | ICD-10-CM

## 2012-10-17 DIAGNOSIS — R7303 Prediabetes: Secondary | ICD-10-CM

## 2012-10-17 LAB — CBC WITH DIFFERENTIAL/PLATELET
Basophils Absolute: 0.1 10*3/uL (ref 0.0–0.1)
HCT: 35.6 % — ABNORMAL LOW (ref 36.0–46.0)
Hemoglobin: 11.6 g/dL — ABNORMAL LOW (ref 12.0–15.0)
Lymphs Abs: 2.4 10*3/uL (ref 0.7–4.0)
MCV: 82.7 fl (ref 78.0–100.0)
Monocytes Absolute: 0.4 10*3/uL (ref 0.1–1.0)
Monocytes Relative: 4.6 % (ref 3.0–12.0)
Neutro Abs: 5.4 10*3/uL (ref 1.4–7.7)
RDW: 15.5 % — ABNORMAL HIGH (ref 11.5–14.6)

## 2012-10-17 LAB — LIPID PANEL
Cholesterol: 111 mg/dL (ref 0–200)
Triglycerides: 109 mg/dL (ref 0.0–149.0)

## 2012-10-17 LAB — BASIC METABOLIC PANEL
BUN: 14 mg/dL (ref 6–23)
Calcium: 9.9 mg/dL (ref 8.4–10.5)
Creatinine, Ser: 0.6 mg/dL (ref 0.4–1.2)
GFR: 107.54 mL/min (ref >60.00)

## 2012-10-17 LAB — HEMOGLOBIN A1C: Hgb A1c MFr Bld: 7.5 % — ABNORMAL HIGH (ref 4.6–6.5)

## 2012-10-17 LAB — HEPATIC FUNCTION PANEL
ALT: 9 U/L (ref 0–35)
AST: 13 U/L (ref 0–37)
Alkaline Phosphatase: 54 U/L (ref 39–117)
Bilirubin, Direct: 0 mg/dL (ref 0.0–0.3)
Total Protein: 7.4 g/dL (ref 6.0–8.3)

## 2012-10-17 LAB — MICROALBUMIN / CREATININE URINE RATIO: Microalb Creat Ratio: 4.4 mg/g (ref 0.0–30.0)

## 2012-10-23 ENCOUNTER — Encounter (HOSPITAL_COMMUNITY): Payer: Self-pay | Admitting: Internal Medicine

## 2012-10-23 ENCOUNTER — Inpatient Hospital Stay (HOSPITAL_COMMUNITY)
Admission: EM | Admit: 2012-10-23 | Discharge: 2012-10-28 | DRG: 988 | Disposition: A | Discharge status: Home / Self Care | Payer: 59 | Attending: Internal Medicine | Admitting: Internal Medicine

## 2012-10-23 DIAGNOSIS — Z87442 Personal history of urinary calculi: Secondary | ICD-10-CM

## 2012-10-23 DIAGNOSIS — H269 Unspecified cataract: Secondary | ICD-10-CM

## 2012-10-23 DIAGNOSIS — Z860100 Personal history of colon polyps, unspecified: Secondary | ICD-10-CM

## 2012-10-23 DIAGNOSIS — Z803 Family history of malignant neoplasm of breast: Secondary | ICD-10-CM

## 2012-10-23 DIAGNOSIS — E119 Type 2 diabetes mellitus without complications: Secondary | ICD-10-CM

## 2012-10-23 DIAGNOSIS — C19 Malignant neoplasm of rectosigmoid junction: Principal | ICD-10-CM | POA: Diagnosis present

## 2012-10-23 DIAGNOSIS — D62 Acute posthemorrhagic anemia: Secondary | ICD-10-CM | POA: Diagnosis present

## 2012-10-23 DIAGNOSIS — R918 Other nonspecific abnormal finding of lung field: Secondary | ICD-10-CM | POA: Diagnosis not present

## 2012-10-23 DIAGNOSIS — I951 Orthostatic hypotension: Secondary | ICD-10-CM

## 2012-10-23 DIAGNOSIS — R079 Chest pain, unspecified: Secondary | ICD-10-CM

## 2012-10-23 DIAGNOSIS — K5900 Constipation, unspecified: Secondary | ICD-10-CM | POA: Diagnosis present

## 2012-10-23 DIAGNOSIS — Z807 Family history of other malignant neoplasms of lymphoid, hematopoietic and related tissues: Secondary | ICD-10-CM

## 2012-10-23 DIAGNOSIS — K625 Hemorrhage of anus and rectum: Secondary | ICD-10-CM

## 2012-10-23 DIAGNOSIS — C7800 Secondary malignant neoplasm of unspecified lung: Secondary | ICD-10-CM | POA: Diagnosis present

## 2012-10-23 DIAGNOSIS — H918X9 Other specified hearing loss, unspecified ear: Secondary | ICD-10-CM | POA: Diagnosis present

## 2012-10-23 DIAGNOSIS — Z79890 Hormone replacement therapy (postmenopausal): Secondary | ICD-10-CM

## 2012-10-23 DIAGNOSIS — Z8041 Family history of malignant neoplasm of ovary: Secondary | ICD-10-CM

## 2012-10-23 DIAGNOSIS — R011 Cardiac murmur, unspecified: Secondary | ICD-10-CM

## 2012-10-23 DIAGNOSIS — C20 Malignant neoplasm of rectum: Secondary | ICD-10-CM

## 2012-10-23 DIAGNOSIS — Z79899 Other long term (current) drug therapy: Secondary | ICD-10-CM

## 2012-10-23 DIAGNOSIS — K921 Melena: Secondary | ICD-10-CM | POA: Diagnosis present

## 2012-10-23 DIAGNOSIS — Z85828 Personal history of other malignant neoplasm of skin: Secondary | ICD-10-CM

## 2012-10-23 DIAGNOSIS — R933 Abnormal findings on diagnostic imaging of other parts of digestive tract: Secondary | ICD-10-CM

## 2012-10-23 DIAGNOSIS — Z800 Family history of malignant neoplasm of digestive organs: Secondary | ICD-10-CM

## 2012-10-23 DIAGNOSIS — Z8639 Personal history of other endocrine, nutritional and metabolic disease: Secondary | ICD-10-CM

## 2012-10-23 HISTORY — DX: Unspecified malignant neoplasm of skin, unspecified: C44.90

## 2012-10-23 HISTORY — DX: Type 2 diabetes mellitus without complications: E11.9

## 2012-10-23 LAB — CBC WITH DIFFERENTIAL/PLATELET
Eosinophils Relative: 4 % (ref 0–5)
Hemoglobin: 11.3 g/dL — ABNORMAL LOW (ref 12.0–15.0)
Lymphocytes Relative: 27 % (ref 12–46)
Lymphs Abs: 2.3 10*3/uL (ref 0.7–4.0)
MCV: 79.1 fL (ref 78.0–100.0)
Monocytes Relative: 6 % (ref 3–12)
Neutrophils Relative %: 63 % (ref 43–77)
Platelets: 353 10*3/uL (ref 150–400)
RBC: 4.21 MIL/uL (ref 3.87–5.11)
WBC: 8.7 10*3/uL (ref 4.0–10.5)

## 2012-10-23 LAB — COMPREHENSIVE METABOLIC PANEL
ALT: 10 U/L (ref 0–35)
Alkaline Phosphatase: 59 U/L (ref 39–117)
CO2: 25 mEq/L (ref 19–32)
GFR calc Af Amer: >90 mL/min (ref >90)
GFR calc non Af Amer: >90 mL/min (ref >90)
Glucose, Bld: 133 mg/dL — ABNORMAL HIGH (ref 70–99)
Potassium: 3.6 mEq/L (ref 3.5–5.1)
Sodium: 138 mEq/L (ref 135–145)

## 2012-10-23 LAB — ABO/RH: ABO/RH(D): O POS

## 2012-10-23 MED ORDER — SODIUM CHLORIDE 0.9 % IJ SOLN: 3.0000 mL | INTRAMUSCULAR | Status: DC | PRN

## 2012-10-23 MED ORDER — PEG 3350-KCL-NA BICARB-NACL 420 G PO SOLR
4000.0000 mL | Freq: Once | ORAL | Status: AC
  Administered 2012-10-23: 4000 mL via ORAL
  Filled 2012-10-23: qty 4000

## 2012-10-23 MED ORDER — METHYLTESTOSTERONE 10 MG PO TABS: 1.2500 mg | ORAL_TABLET | Freq: Every day | ORAL | Status: DC

## 2012-10-23 MED ORDER — FENTANYL CITRATE 0.05 MG/ML IJ SOLN
INTRAMUSCULAR | Status: AC
  Filled 2012-10-23: qty 2

## 2012-10-23 MED ORDER — SODIUM CHLORIDE 0.9 % IV SOLN
250.0000 mL | INTRAVENOUS | Status: DC | PRN
  Administered 2012-10-23: 250 mL via INTRAVENOUS

## 2012-10-23 MED ORDER — SODIUM CHLORIDE 0.9 % IJ SOLN
3.0000 mL | Freq: Two times a day (BID) | INTRAMUSCULAR | Status: DC
  Administered 2012-10-24 – 2012-10-27 (×8): 3 mL via INTRAVENOUS

## 2012-10-23 MED ORDER — ONE-DAILY MULTI VITAMINS PO TABS: 1.0000 | ORAL_TABLET | Freq: Every day | ORAL | Status: DC

## 2012-10-23 MED ORDER — ACETAMINOPHEN 325 MG PO TABS: 650.0000 mg | ORAL_TABLET | Freq: Four times a day (QID) | ORAL | Status: DC | PRN

## 2012-10-23 MED ORDER — MORPHINE SULFATE 2 MG/ML IJ SOLN
1.0000 mg | INTRAMUSCULAR | Status: DC | PRN
  Administered 2012-10-24 – 2012-10-25 (×5): 1 mg via INTRAVENOUS
  Filled 2012-10-23 (×5): qty 1

## 2012-10-23 MED ORDER — SODIUM CHLORIDE 0.9 % IJ SOLN
3.0000 mL | Freq: Two times a day (BID) | INTRAMUSCULAR | Status: DC
  Administered 2012-10-24 – 2012-10-25 (×2): 3 mL via INTRAVENOUS

## 2012-10-23 MED ORDER — FENTANYL CITRATE 0.05 MG/ML IJ SOLN
25.0000 ug | Freq: Once | INTRAMUSCULAR | Status: AC
  Administered 2012-10-23: 25 ug via INTRAVENOUS

## 2012-10-23 MED ORDER — ADULT MULTIVITAMIN W/MINERALS CH
1.0000 | ORAL_TABLET | Freq: Every day | ORAL | Status: DC
  Administered 2012-10-25 – 2012-10-28 (×4): 1 via ORAL
  Filled 2012-10-23 (×5): qty 1

## 2012-10-23 MED ORDER — ESTRADIOL 0.0375 MG/24HR TD PTTW: 1.0000 | MEDICATED_PATCH | TRANSDERMAL | Status: DC

## 2012-10-23 MED ORDER — SODIUM CHLORIDE 0.9 % IV SOLN
Freq: Once | INTRAVENOUS | Status: AC
  Administered 2012-10-23: 19:00:00 via INTRAVENOUS

## 2012-10-23 MED ORDER — INSULIN ASPART 100 UNIT/ML ~~LOC~~ SOLN: 0.0000 [IU] | Freq: Four times a day (QID) | SUBCUTANEOUS | Status: DC

## 2012-10-23 NOTE — Consult Note (Addendum)
Cross cover [facility] Reason for Consult: Rectal bleeding. Referring Physician: ER MD  DANIQUA CAMPOY is an 67 y.o. female.  HPI: 67 year old white female, who was doing fairly well today, busy cleaning her house this afternoon, when she had an urge to have a BM and passed a large amount of BRB. Shortly thereafter, she passed two large clots. She had some LLQ pain but describes this as mild discomfort. There is no history of nausea, vomiting, fever, chills, melena, fever or chills. She has lost 60 lbs in the last 4 years and claims this weight loss has been intentional as she was "pre-diabetic". She has a good appetite. She has 1 BM every 1-2 days and for the last 4-5 days she has noticed a change in the caliber of the stools with some mucus in it.  She had a colonoscopy in 2011 when polyps were removed [tubular adenomas] and sigmoid diverticulosis was noted. She denies having any dizziness, syncope or near syncope with the bleeding. There is no know history of ulcers, jaundice or colitis. She uses Ibuprofen occasionally for headaches. She is scheduled to see Dr. Berniece Andreas for her yearly physical tomorrow.     Past Medical History  Diagnosis Date  . Heart murmur, systolic 07/01/2011  . Postmenopausal HRT (hormone replacement therapy) 07/01/2011    surgical menopause  . Hx of hyperglycemia 07/01/2011  . Hx of compression fracture of spine 1985    t12 after a  20 foot fall  . Hx of fracture of wrist     after fall   . 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 Hx of colonic polyps   . Early cataract     stoneburner   . High frequency hearing loss     kraus followed    Past Surgical History  Procedure Laterality Date  . Tonsillectomy  1952  . Adenoidectomy  1952  . Wrist surgery  1957    left reduction  . Compression fraction  1985    T12  . Anterior cruciate ligament repair  1993    right Murphy  . Cosmetic surgery  1999-2007    holderness blepharoplasty facial rhytidectomy mastopexy  abdominoplasty   . Dental implant    . Dilation and curettage of uterus  1977  . Total abdominal hysterectomy  2005    with cystocele repair    Social History:  reports that she has never smoked. She has never used smokeless tobacco. She reports that she drinks about 1.0 ounces of alcohol per week. She reports that she does not use illicit drugs. 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    Family history: Brother died of retroperitoneal lymphosarcoma at age 63 after returning from [place] at age 34; sister had breast cancer and ovarian cancer in her early 73's. Father died of an MI at 8, he had diabetes, thyroid disease and oral cancer. Mother is 86 and lives with her; she has dementia and hyperlipidemia.     Allergies:  Allergies  Allergen Reactions  . Midazolam Other (See Comments)    Caused very crazy feelings, terrors, panic attacks, anxiety  . Zolpidem Tartrate Other (See Comments)    Caused very crazy feelings, terrors, shaking, panic attacks, anxiety  . Neomycin-Bacitracin Zn-Polymyx Itching and Rash   Medications: I have reviewed the patient's current medications.  Results for orders placed during the hospital  encounter of 10/23/12 (from the past 48 hour(s))  CBC WITH DIFFERENTIAL     Status: Abnormal   Collection Time    10/23/12  4:37 PM      Result Value Range   WBC 8.7  4.0 - 10.5 K/uL   RBC 4.21  3.87 - 5.11 MIL/uL   Hemoglobin 11.3 (*) 12.0 - 15.0 g/dL   HCT 40.9 (*) 81.1 - 91.4 %   MCV 79.1  78.0 - 100.0 fL   MCH 26.8  26.0 - 34.0 pg   MCHC 33.9  30.0 - 36.0 g/dL   RDW 78.2  95.6 - 21.3 %   Platelets 353  150 - 400 K/uL   Neutrophils Relative % 63  43 - 77 %   Neutro Abs 5.5  1.7 - 7.7 K/uL   Lymphocytes Relative 27  12 - 46 %   Lymphs Abs 2.3  0.7 - 4.0 K/uL   Monocytes Relative 6  3 - 12 %   Monocytes Absolute 0.5  0.1 - 1.0 K/uL   Eosinophils Relative 4  0 - 5 %    Eosinophils Absolute 0.3  0.0 - 0.7 K/uL   Basophils Relative 1  0 - 1 %   Basophils Absolute 0.1  0.0 - 0.1 K/uL  COMPREHENSIVE METABOLIC PANEL     Status: Abnormal   Collection Time    10/23/12  4:37 PM      Result Value Range   Sodium 138  135 - 145 mEq/L   Potassium 3.6  3.5 - 5.1 mEq/L   Chloride 98  96 - 112 mEq/L   CO2 25  19 - 32 mEq/L   Glucose, Bld 133 (*) 70 - 99 mg/dL   BUN 15  6 - 23 mg/dL   Creatinine, Ser 0.86  0.50 - 1.10 mg/dL   Calcium 57.8  8.4 - 46.9 mg/dL   Total Protein 7.4  6.0 - 8.3 g/dL   Albumin 4.0  3.5 - 5.2 g/dL   AST 15  0 - 37 U/L   ALT 10  0 - 35 U/L   Alkaline Phosphatase 59  39 - 117 U/L   Total Bilirubin 0.2 (*) 0.3 - 1.2 mg/dL   GFR calc non Af Amer >90  >90 mL/min   GFR calc Af Amer >90  >90 mL/min   Comment:            The eGFR has been calculated     using the CKD EPI equation.     This calculation has not been     validated in all clinical     situations.     eGFR's persistently     <90 mL/min signify     possible Chronic Kidney Disease.  APTT     Status: None   Collection Time    10/23/12  4:37 PM      Result Value Range   aPTT 28  24 - 37 seconds  PROTIME-INR     Status: None   Collection Time    10/23/12  4:37 PM      Result Value Range   Prothrombin Time 13.2  11.6 - 15.2 seconds   INR 1.02  0.00 - 1.49  TYPE AND SCREEN     Status: None   Collection Time    10/23/12  4:40 PM      Result Value Range   ABO/RH(D) O POS     Antibody Screen NEG     Sample Expiration  10/26/2012     Review of Systems  Constitutional: Negative.   Eyes: Negative.   Respiratory: Negative.   Cardiovascular: Negative.   Gastrointestinal: Positive for abdominal pain, constipation and blood in stool. Negative for heartburn, diarrhea and melena.  Genitourinary: Negative.   Musculoskeletal: Negative.   Skin: Negative.   Neurological: Positive for headaches.  Endo/Heme/Allergies: Negative.   Psychiatric/Behavioral: Negative.    Blood pressure  142/80, pulse 97, temperature 99.3 F (37.4 C), temperature source Oral, resp. rate 20, height 5' 4.5" (1.638 m), weight 62.596 kg (138 lb), SpO2 97.00%. Physical Exam  Constitutional: She is oriented to person, place, and time. She appears well-developed and well-nourished.  HENT:  Head: Normocephalic and atraumatic.  Eyes: Conjunctivae and EOM are normal. Pupils are equal, round, and reactive to light.  Neck: Normal range of motion. Neck supple.  Cardiovascular: Normal rate and regular rhythm.   Respiratory: Effort normal and breath sounds normal.  GI: Soft. Bowel sounds are normal. She exhibits no distension and no mass. There is tenderness. There is no rebound and no guarding.  Musculoskeletal: Normal range of motion.  Neurological: She is alert and oriented to person, place, and time.  Skin: Skin is warm and dry.  Psychiatric: She has a normal mood and affect. Her behavior is normal. Judgment and thought content normal.   Assessment/Plan: 1) Rectal bleeding with mild anemia- suspect this is a diverticular bleed: will prep for a colonoscopy tomorrow. Will allow patient some clears and follow serial CBC's.  2) History of colonic polyps and sigmoid diverticulosis. 3) AODM  4) High frequency hearing loss.     Makya Yurko 10/23/2012, 8:05 PM

## 2012-10-23 NOTE — ED Notes (Signed)
Pt assisted to bedside commode for urine sample.  Unable to urinate.  No rectal bleeding or clots noted.

## 2012-10-23 NOTE — ED Notes (Signed)
MD at bedside. 

## 2012-10-23 NOTE — H&P (Signed)
Anne [name] is an 67 y.o. female.    [facility] ADMISSION H&P  Chief Complaint: BRBPR  HPI: 66 yr. Old WF w/ hx NIDDM type 2, hx diverticulosis, hx compression fracture, on HRT, presents with BRBPR. 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  She states she has been purposely trying to lose weight by modified her diet, so she eats small portions.  She has been successful thus far.  She states Wednesday she noticed some LLQ discomfort that felt like bloating.  She states this was not associated with nausea or vomiting, but did pass a "mucousy" stool.  She states this morning she had a sudden urge to defecate and noticed a significant amount of BRB and clots.  She states another episode occurred and had clots as big as her hand, so she decided to come to the ED. She denies any CP or SOB. She denies any presyncope/syncope.  In the ED, she was noted to have traces of BRB near her rectum per the ED attending. I discussed the came with Dr. Loreta Ave of [facility] who are planning on performing a colonoscopy in the AM.  Currently, she states she feels better after getting pain medication.  She has not had any further bloody bowel movements. Of note, she was noted to be orthostatic in the ED with a SBP of 170 sitting and SBP 142 standing, and the ED is giving her 1 L NS.  Currently she has been typed and screened.  Past Medical History  Diagnosis Date  . Heart murmur, systolic 07/01/2011  . Postmenopausal HRT (hormone replacement therapy) 07/01/2011    surgical menopause  . Hx of hyperglycemia 07/01/2011  . Hx of compression fracture of spine 1985    t12 after a  20 foot fall  . Hx of fracture of wrist     after fall   . 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 Hx of colonic polyps   . Early cataract     stoneburner   . High frequency hearing loss     kraus followed   . Diabetes mellitus without complication     Past Surgical History  Procedure Laterality Date  . Tonsillectomy  1952   . Adenoidectomy  1952  . Wrist surgery  1957    left reduction  . Compression fraction  1985    T12  . Anterior cruciate ligament repair  1993    right Murphy  . Cosmetic surgery  1999-2007    holderness blepharoplasty facial rhytidectomy mastopexy abdominoplasty   . Dental implant    . Dilation and curettage of uterus  1977  . Total abdominal hysterectomy  2005    with cystocele repair     Family History  Problem Relation Age of Onset  . Diabetes Father   . Cancer Father     oral  . Thyroid disease Father   . Heart attack Father     died age 69  . Dementia Mother   . Hypertension Mother   . Hyperlipidemia Mother   . Cancer Brother     retro lymphosarcoma agent orange  . Cancer Sister     clear cell ovarina and breast insitu  . Obesity      siblings   . Diabetes Brother   . Alcohol abuse Brother     hx of  . Other      arrythmia   . Hypertension Sister   . Asthma Sister  Social History:  reports that she has never smoked. She has never used smokeless tobacco. She reports that she drinks about 1.0 ounces of alcohol per week. She reports that she does not use illicit drugs.  Allergies:  Allergies  Allergen Reactions  . Midazolam Other (See Comments)    Caused very crazy feelings, terrors, panic attacks, anxiety  . Zolpidem Tartrate Other (See Comments)    Caused very crazy feelings, terrors, shaking, panic attacks, anxiety  . Neomycin-Bacitracin Zn-Polymyx Itching and Rash     (Not in a hospital admission)  Results for orders placed during the hospital encounter of 10/23/12 (from the past 48 hour(s))  CBC WITH DIFFERENTIAL     Status: Abnormal   Collection Time    10/23/12  4:37 PM      Result Value Range   WBC 8.7  4.0 - 10.5 K/uL   RBC 4.21  3.87 - 5.11 MIL/uL   Hemoglobin 11.3 (*) 12.0 - 15.0 g/dL   HCT 16.1 (*) 09.6 - 04.5 %   MCV 79.1  78.0 - 100.0 fL   MCH 26.8  26.0 - 34.0 pg   MCHC 33.9  30.0 - 36.0 g/dL   RDW 40.9  81.1 - 91.4 %    Platelets 353  150 - 400 K/uL   Neutrophils Relative % 63  43 - 77 %   Neutro Abs 5.5  1.7 - 7.7 K/uL   Lymphocytes Relative 27  12 - 46 %   Lymphs Abs 2.3  0.7 - 4.0 K/uL   Monocytes Relative 6  3 - 12 %   Monocytes Absolute 0.5  0.1 - 1.0 K/uL   Eosinophils Relative 4  0 - 5 %   Eosinophils Absolute 0.3  0.0 - 0.7 K/uL   Basophils Relative 1  0 - 1 %   Basophils Absolute 0.1  0.0 - 0.1 K/uL  COMPREHENSIVE METABOLIC PANEL     Status: Abnormal   Collection Time    10/23/12  4:37 PM      Result Value Range   Sodium 138  135 - 145 mEq/L   Potassium 3.6  3.5 - 5.1 mEq/L   Chloride 98  96 - 112 mEq/L   CO2 25  19 - 32 mEq/L   Glucose, Bld 133 (*) 70 - 99 mg/dL   BUN 15  6 - 23 mg/dL   Creatinine, Ser 7.82  0.50 - 1.10 mg/dL   Calcium 95.6  8.4 - 21.3 mg/dL   Total Protein 7.4  6.0 - 8.3 g/dL   Albumin 4.0  3.5 - 5.2 g/dL   AST 15  0 - 37 U/L   ALT 10  0 - 35 U/L   Alkaline Phosphatase 59  39 - 117 U/L   Total Bilirubin 0.2 (*) 0.3 - 1.2 mg/dL   GFR calc non Af Amer >90  >90 mL/min   GFR calc Af Amer >90  >90 mL/min   Comment:            The eGFR has been calculated     using the CKD EPI equation.     This calculation has not been     validated in all clinical     situations.     eGFR's persistently     <90 mL/min signify     possible Chronic Kidney Disease.  APTT     Status: None   Collection Time    10/23/12  4:37 PM      Result  Value Range   aPTT 28  24 - 37 seconds  PROTIME-INR     Status: None   Collection Time    10/23/12  4:37 PM      Result Value Range   Prothrombin Time 13.2  11.6 - 15.2 seconds   INR 1.02  0.00 - 1.49  TYPE AND SCREEN     Status: None   Collection Time    10/23/12  4:40 PM      Result Value Range   ABO/RH(D) O POS     Antibody Screen NEG     Sample Expiration 10/26/2012     No results found.  Review of Systems  Respiratory: Negative for shortness of breath.   Cardiovascular: Negative for chest pain.  Gastrointestinal: Positive for  abdominal pain and blood in stool. Negative for nausea and vomiting.  Neurological: Negative for dizziness and loss of consciousness.    Blood pressure 142/80, pulse 97, temperature 99.3 F (37.4 C), temperature source Oral, resp. rate 20, height 5' 4.5" (1.638 m), weight 138 lb (62.596 kg), SpO2 97.00%. Physical Exam  Constitutional: She is oriented to person, place, and time. She appears well-developed and well-nourished. No distress.  HENT:  Head: Normocephalic and atraumatic.  Eyes: EOM are normal.  Cardiovascular: Normal rate, regular rhythm, normal heart sounds and intact distal pulses.  Exam reveals no gallop and no friction rub.   Respiratory: Effort normal and breath sounds normal. No respiratory distress. She has no wheezes. She has no rales.  GI: Soft. Bowel sounds are normal. There is tenderness in the left lower quadrant. There is no rigidity, no rebound and no guarding.  Musculoskeletal: She exhibits no edema.  Neurological: She is alert and oriented to person, place, and time.  Skin: Skin is warm.  Psychiatric: She has a normal mood and affect. Her behavior is normal. Thought content normal.     Assessment/Plan 66 yr. Old WF w/ hx NIDDM type 2, hx diverticulosis, hx compression fracture, on HRT, presents with BRBPR. 1) BRBPR: This is likely a diverticular bleed.  She will be admitted to telemetry and monitored for recurrence of bleeding. I have discussed the case with Dr. Loreta Ave of [facility].  She has a hx of known diverticulosis (Colonoscopy 2011 by Dr. Leone Payor, also with 3 polyps removed).  I will repeat orthostatic VS post 1 L NS. But I will hold off on giving more IVF.  If her Hct continues to down trend, transfusion will be considered. She is currently hemodynamically stable.  She is agreeable to transfusion if necessary. I will repeat Hgb/Hct at midnight, 5 am, and again tomorrow at noon.  GI has written for bowel prep. 2) Type 2 DM: Hold PO hypoglycemics. Monitor BG q6hrs  while NPO. ISS written for. 3) HRT: I have written for her meds, but she can take her own meds if desired. She just changed her patch today. 4) Proph: SCDs, no heparin. 5) Code: FULL  Jonah Blue 10/23/2012, 8:40 PM

## 2012-10-23 NOTE — ED Provider Notes (Signed)
I saw and evaluated the patient, reviewed the resident's note and I agree with the findings and plan.  I reviewed and agree with ECG interpretation by Dr. Malen Gauze.  Pt with sudden hematochezia, no prior h/o trauma, rectal bleeding, has had a colonoscopy about 2 years ago showing 3 polyps that were removed by Dr. Leone Payor.  No syncope, abd soft, no guarding, no respiratory distress here.  Hgb is stable compared to CBC from 1 week ago.  Mild anemia.  Discussed with GI on call who will see pt in the ED.  Pt is not on any blood thinners.  No evidence of orthostasis based on symptoms, however pt is tachycardic.  Pt with visible bleeding from rectum on initial gross exam.  Unable to visualize a specific bleeding vessel, likely more proximally.      8:18 PM [facility] consulted, will admit to Hospitalist for admission for lower GI bleed and hematochezia  Anne [name]. Shion Bluestein, MD 10/23/12 2018

## 2012-10-23 NOTE — ED Provider Notes (Signed)
CSN: [id number]     Arrival date & time 10/23/12  1558 History     First MD Initiated Contact with Patient 10/23/12 1618     Chief Complaint  Patient presents with  . GI Bleeding   (Consider location/radiation/quality/duration/timing/severity/associated sxs/prior Treatment) Patient is a 67 y.o. female presenting with hematochezia. The history is provided by the patient and a relative.  Rectal Bleeding Quality:  Bright red (with clot) Amount:  Copious Duration: 2-3 hours. Timing:  Intermittent Progression:  Worsening Chronicity:  New Context: not anal fissures, not anal penetration, not constipation, not defecation, not diarrhea, not foreign body, not hemorrhoids, not rectal injury and not rectal pain   Relieved by:  Nothing (patient tried to place a small wash cloth into her rectum to stop the bleeding) Ineffective treatments: patient tried to place a small wash cloth into her rectum to stop the bleeding. Associated symptoms: no abdominal pain, no dizziness, no epistaxis, no fever, no hematemesis, no light-headedness, no loss of consciousness, no recent illness and no vomiting   Risk factors: no anticoagulant use, no hx of colorectal cancer, no hx of colorectal surgery, no NSAID use and no steroid use     Past Medical History  Diagnosis Date  . Heart murmur, systolic 07/01/2011  . Postmenopausal HRT (hormone replacement therapy) 07/01/2011    surgical menopause  . Hx of hyperglycemia 07/01/2011  . Hx of compression fracture of spine 1985    t12 after a  20 foot fall  . Hx of fracture of wrist     after fall   . 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 Hx of colonic polyps   . Early cataract     stoneburner   . High frequency hearing loss     kraus followed   . Diabetes mellitus without complication    Past Surgical History  Procedure Laterality Date  . Tonsillectomy  1952  . Adenoidectomy  1952  . Wrist surgery  1957    left reduction  . Compression fraction  1985   T12  . Anterior cruciate ligament repair  1993    right Murphy  . Cosmetic surgery  1999-2007    holderness blepharoplasty facial rhytidectomy mastopexy abdominoplasty   . Dental implant    . Dilation and curettage of uterus  1977  . Total abdominal hysterectomy  2005    with cystocele repair    Family History  Problem Relation Age of Onset  . Diabetes Father   . Cancer Father     oral  . Thyroid disease Father   . Heart attack Father     died age 67  . Dementia Mother   . Hypertension Mother   . Hyperlipidemia Mother   . Cancer Brother     retro lymphosarcoma agent orange  . Cancer Sister     clear cell ovarina and breast insitu  . Obesity      siblings   . Diabetes Brother   . Alcohol abuse Brother     hx of  . Other      arrythmia   . Hypertension Sister   . Asthma Sister    History  Substance Use Topics  . Smoking status: Never Smoker   . Smokeless tobacco: Never Used  . Alcohol Use: 1.0 oz/week    2 drink(s) per week   OB History   Grav Para Term Preterm Abortions TAB SAB Ect Mult Living  Review of Systems  Constitutional: Negative for fever, chills, diaphoresis, activity change and appetite change.  HENT: Negative for nosebleeds, sore throat, rhinorrhea, sneezing, drooling and trouble swallowing.   Eyes: Negative for discharge and redness.  Respiratory: Negative for cough, chest tightness, shortness of breath, wheezing and stridor.   Cardiovascular: Negative for chest pain and leg swelling.  Gastrointestinal: Positive for hematochezia and anal bleeding. Negative for nausea, vomiting, abdominal pain, diarrhea, constipation, blood in stool and hematemesis.  Genitourinary: Negative for difficulty urinating.  Musculoskeletal: Negative for myalgias and arthralgias.  Skin: Negative for pallor.  Neurological: Negative for dizziness, loss of consciousness, syncope, speech difficulty, weakness, light-headedness and headaches.  Hematological:  Negative for adenopathy. Does not bruise/bleed easily.  Psychiatric/Behavioral: Negative for confusion and agitation.    Allergies  Midazolam; Zolpidem tartrate; and Neomycin-bacitracin zn-polymyx  Home Medications   No current outpatient prescriptions on file. BP 158/72  Pulse 90  Temp(Src) 98.7 F (37.1 C) (Oral)  Resp 16  Ht 5' 4.8" (1.646 m)  Wt 145 lb 1.6 oz (65.817 kg)  BMI 24.29 kg/m2  SpO2 99% Physical Exam  Constitutional: She is oriented to person, place, and time. She appears well-developed and well-nourished. No distress.  HENT:  Head: Normocephalic and atraumatic.  Right Ear: External ear normal.  Left Ear: External ear normal.  Eyes: Conjunctivae and EOM are normal. Right eye exhibits no discharge. Left eye exhibits no discharge.  Neck: Normal range of motion. Neck supple. No JVD present.  Cardiovascular: Regular rhythm and normal heart sounds.  Tachycardia present.  Exam reveals no gallop and no friction rub.   No murmur heard. Pulmonary/Chest: Effort normal and breath sounds normal. No stridor. No respiratory distress. She has no wheezes. She has no rales. She exhibits no tenderness.  Abdominal: Soft. Bowel sounds are normal. She exhibits no distension. There is no tenderness. There is no rebound and no guarding.  Genitourinary: Rectal exam shows no external hemorrhoid and no fissure. Guaiac positive stool (actively bleeding from rectum on exam).  Musculoskeletal: Normal range of motion. She exhibits no edema.  Neurological: She is alert and oriented to person, place, and time.  Skin: Skin is warm. No rash noted. She is not diaphoretic.  Psychiatric: She has a normal mood and affect. Her behavior is normal.    ED Course   Procedures (including critical care time)  Labs Reviewed  CBC WITH DIFFERENTIAL - Abnormal; Notable for the following:    Hemoglobin 11.3 (*)    HCT 33.3 (*)    All other components within normal limits  COMPREHENSIVE METABOLIC PANEL -  Abnormal; Notable for the following:    Glucose, Bld 133 (*)    Total Bilirubin 0.2 (*)    All other components within normal limits  GLUCOSE, CAPILLARY - Abnormal; Notable for the following:    Glucose-Capillary 123 (*)    All other components within normal limits  APTT  PROTIME-INR  HEMOGLOBIN AND HEMATOCRIT, BLOOD  COMPREHENSIVE METABOLIC PANEL  CBC  PROTIME-INR  APTT  HEMOGLOBIN AND HEMATOCRIT, BLOOD  TYPE AND SCREEN  ABO/RH   Results for orders placed during the hospital encounter of 10/23/12  CBC WITH DIFFERENTIAL      Result Value Range   WBC 8.7  4.0 - 10.5 K/uL   RBC 4.21  3.87 - 5.11 MIL/uL   Hemoglobin 11.3 (*) 12.0 - 15.0 g/dL   HCT 16.1 (*) 09.6 - 04.5 %   MCV 79.1  78.0 - 100.0 fL   MCH 26.8  26.0 - 34.0 pg   MCHC 33.9  30.0 - 36.0 g/dL   RDW 62.1  30.8 - 65.7 %   Platelets 353  150 - 400 K/uL   Neutrophils Relative % 63  43 - 77 %   Neutro Abs 5.5  1.7 - 7.7 K/uL   Lymphocytes Relative 27  12 - 46 %   Lymphs Abs 2.3  0.7 - 4.0 K/uL   Monocytes Relative 6  3 - 12 %   Monocytes Absolute 0.5  0.1 - 1.0 K/uL   Eosinophils Relative 4  0 - 5 %   Eosinophils Absolute 0.3  0.0 - 0.7 K/uL   Basophils Relative 1  0 - 1 %   Basophils Absolute 0.1  0.0 - 0.1 K/uL  COMPREHENSIVE METABOLIC PANEL      Result Value Range   Sodium 138  135 - 145 mEq/L   Potassium 3.6  3.5 - 5.1 mEq/L   Chloride 98  96 - 112 mEq/L   CO2 25  19 - 32 mEq/L   Glucose, Bld 133 (*) 70 - 99 mg/dL   BUN 15  6 - 23 mg/dL   Creatinine, Ser 8.46  0.50 - 1.10 mg/dL   Calcium 96.2  8.4 - 95.2 mg/dL   Total Protein 7.4  6.0 - 8.3 g/dL   Albumin 4.0  3.5 - 5.2 g/dL   AST 15  0 - 37 U/L   ALT 10  0 - 35 U/L   Alkaline Phosphatase 59  39 - 117 U/L   Total Bilirubin 0.2 (*) 0.3 - 1.2 mg/dL   GFR calc non Af Amer >90  >90 mL/min   GFR calc Af Amer >90  >90 mL/min  APTT      Result Value Range   aPTT 28  24 - 37 seconds  PROTIME-INR      Result Value Range   Prothrombin Time 13.2  11.6 - 15.2  seconds   INR 1.02  0.00 - 1.49  GLUCOSE, CAPILLARY      Result Value Range   Glucose-Capillary 123 (*) 70 - 99 mg/dL   Comment 1 Notify RN    TYPE AND SCREEN      Result Value Range   ABO/RH(D) O POS     Antibody Screen NEG     Sample Expiration 10/26/2012    ABO/RH      Result Value Range   ABO/RH(D) O POS       Date: 10/24/2012  Rate: 94  Rhythm: normal sinus rhythm  QRS Axis: normal  Intervals: normal  ST/T Wave abnormalities: nonspecific T wave changes  Conduction Disutrbances:none  Narrative Interpretation: NSR  Old EKG Reviewed: none available and unchanged   No results found. 1. Bright red blood per rectum   2. DM type 2 (diabetes mellitus, type 2)   3. Hx of colonic polyps   4. Orthostatic hypotension     MDM  Dorena Bodo ,67 y.o. presents for a GI bleed. She's tachycardic. No acute distress. Hemodynamically stable. No signs or symptoms of peritonitis. Gross blood pulsating from rectum, likely with increased intra-abdominal pressure. Hemoglobin 11.3. Patient typed and screened. GI consulted and following patient. Patient was admitted to hospitalist service for serial hemoglobins in likely colonoscopy in the morning. Patient was stable 1 to my care. She remained n.p.o. UA negative. No coagulopathy noted labs. She was admitted without events.  Labs, imaging, and EKG reviewed. I discussed this patient's care at my attending, Dr.  Tyron Russell, MD 10/24/12 [phone]

## 2012-10-23 NOTE — ED Notes (Signed)
Pt passed stool of large clots of blood and approx 500 cc of bright red blood.  No pain noted.  VS stable.

## 2012-10-23 NOTE — ED Notes (Signed)
Pt requesting pain meds for increasing pain to LLQ.

## 2012-10-24 ENCOUNTER — Encounter: Payer: 59 | Admitting: Internal Medicine

## 2012-10-24 ENCOUNTER — Encounter (HOSPITAL_COMMUNITY)
Admission: EM | Disposition: A | Discharge status: Home / Self Care | Payer: Self-pay | Source: Home / Self Care | Attending: Internal Medicine

## 2012-10-24 ENCOUNTER — Encounter (HOSPITAL_COMMUNITY): Payer: Self-pay | Admitting: *Deleted

## 2012-10-24 HISTORY — PX: COLONOSCOPY: SHX5424

## 2012-10-24 LAB — COMPREHENSIVE METABOLIC PANEL
AST: 13 U/L (ref 0–37)
CO2: 27 mEq/L (ref 19–32)
Calcium: 9.5 mg/dL (ref 8.4–10.5)
Creatinine, Ser: 0.54 mg/dL (ref 0.50–1.10)
GFR calc non Af Amer: >90 mL/min (ref >90)

## 2012-10-24 LAB — URINALYSIS, ROUTINE W REFLEX MICROSCOPIC
Glucose, UA: NEGATIVE mg/dL
Ketones, ur: 15 mg/dL — AB
Nitrite: NEGATIVE
Specific Gravity, Urine: 1.011 (ref 1.005–1.030)
pH: 7 (ref 5.0–8.0)

## 2012-10-24 LAB — GLUCOSE, CAPILLARY
Glucose-Capillary: 105 mg/dL — ABNORMAL HIGH (ref 70–99)
Glucose-Capillary: 114 mg/dL — ABNORMAL HIGH (ref 70–99)

## 2012-10-24 LAB — PROTIME-INR
INR: 1.04 (ref 0.00–1.49)
Prothrombin Time: 13.4 seconds (ref 11.6–15.2)

## 2012-10-24 LAB — CBC
Hemoglobin: 9.5 g/dL — ABNORMAL LOW (ref 12.0–15.0)
MCH: 26.5 pg (ref 26.0–34.0)
RBC: 3.58 MIL/uL — ABNORMAL LOW (ref 3.87–5.11)
WBC: 8.3 10*3/uL (ref 4.0–10.5)

## 2012-10-24 LAB — HEMOGLOBIN AND HEMATOCRIT, BLOOD
HCT: 29.4 % — ABNORMAL LOW (ref 36.0–46.0)
HCT: 28.9 % — ABNORMAL LOW (ref 36.0–46.0)
Hemoglobin: 9.9 g/dL — ABNORMAL LOW (ref 12.0–15.0)
Hemoglobin: 9.4 g/dL — ABNORMAL LOW (ref 12.0–15.0)

## 2012-10-24 LAB — URINE MICROSCOPIC-ADD ON

## 2012-10-24 LAB — APTT: aPTT: 28 seconds (ref 24–37)

## 2012-10-24 SURGERY — COLONOSCOPY
Anesthesia: Moderate Sedation

## 2012-10-24 MED ORDER — DIAZEPAM 5 MG/ML IJ SOLN
INTRAMUSCULAR | Status: AC
  Filled 2012-10-24: qty 4

## 2012-10-24 MED ORDER — SODIUM CHLORIDE 0.9 % IV SOLN
INTRAVENOUS | Status: DC
  Administered 2012-10-24: 500 mL via INTRAVENOUS

## 2012-10-24 MED ORDER — ONDANSETRON HCL 4 MG/2ML IJ SOLN
INTRAMUSCULAR | Status: AC
  Administered 2012-10-24: 4 mg via INTRAVENOUS
  Filled 2012-10-24: qty 2

## 2012-10-24 MED ORDER — INSULIN ASPART 100 UNIT/ML ~~LOC~~ SOLN
0.0000 [IU] | Freq: Four times a day (QID) | SUBCUTANEOUS | Status: DC
  Administered 2012-10-25: 1 [IU] via SUBCUTANEOUS
  Administered 2012-10-26: 5 [IU] via SUBCUTANEOUS
  Administered 2012-10-26 – 2012-10-27 (×2): 1 [IU] via SUBCUTANEOUS
  Administered 2012-10-27 (×2): 2 [IU] via SUBCUTANEOUS
  Administered 2012-10-28 (×3): 1 [IU] via SUBCUTANEOUS

## 2012-10-24 MED ORDER — PEG-KCL-NACL-NASULF-NA ASC-C 100 G PO SOLR
0.5000 | Freq: Once | ORAL | Status: AC
  Administered 2012-10-25: 100 g via ORAL
  Filled 2012-10-24: qty 1

## 2012-10-24 MED ORDER — FENTANYL CITRATE 0.05 MG/ML IJ SOLN
INTRAMUSCULAR | Status: AC
  Filled 2012-10-24: qty 4

## 2012-10-24 MED ORDER — PEG-KCL-NACL-NASULF-NA ASC-C 100 G PO SOLR: 1.0000 | Freq: Once | ORAL | Status: DC

## 2012-10-24 MED ORDER — BISACODYL 5 MG PO TBEC
5.0000 mg | DELAYED_RELEASE_TABLET | Freq: Once | ORAL | Status: DC
  Filled 2012-10-24: qty 1

## 2012-10-24 MED ORDER — FENTANYL CITRATE 0.05 MG/ML IJ SOLN
INTRAMUSCULAR | Status: DC | PRN
  Administered 2012-10-24 (×3): 25 ug via INTRAVENOUS

## 2012-10-24 MED ORDER — SODIUM CHLORIDE 0.9 % IV SOLN: INTRAVENOUS | Status: DC

## 2012-10-24 MED ORDER — DIPHENHYDRAMINE HCL 50 MG/ML IJ SOLN
INTRAMUSCULAR | Status: AC
  Filled 2012-10-24: qty 1

## 2012-10-24 MED ORDER — PEG-KCL-NACL-NASULF-NA ASC-C 100 G PO SOLR
0.5000 | Freq: Once | ORAL | Status: AC
  Administered 2012-10-24: 100 g via ORAL
  Filled 2012-10-24: qty 1

## 2012-10-24 MED ORDER — ONDANSETRON HCL 4 MG/2ML IJ SOLN: 4.0000 mg | Freq: Four times a day (QID) | INTRAMUSCULAR | Status: DC | PRN

## 2012-10-24 MED ORDER — BISACODYL 5 MG PO TBEC
5.0000 mg | DELAYED_RELEASE_TABLET | Freq: Once | ORAL | Status: AC
  Administered 2012-10-24: 5 mg via ORAL
  Filled 2012-10-24: qty 1

## 2012-10-24 MED ORDER — DIAZEPAM 5 MG/ML IJ SOLN
INTRAMUSCULAR | Status: DC | PRN
  Administered 2012-10-24 (×3): 2.5 mg via INTRAVENOUS

## 2012-10-24 NOTE — Progress Notes (Signed)
[facility] PROGRESS NOTE  Anne [name] ZOX:[id number] DOB: 1945/05/20 DOA: 10/23/2012 PCP: [name], MD  Brief HPI; 66 yr. Old WF w/ hx NIDDM type 2, hx diverticulosis, hx compression fracture, on HRT, presents with BRBPR. In the ED, she was noted to have traces of BRB near her rectum per the ED attending. GI consulted, and plan for colonoscopy on 7/18, but it was a poor prep and she is scheduled for colonoscopy on 7/29.    Assessment/Plan:  1.  BRBPR: This is likely a diverticular bleed. She will be admitted to telemetry and monitored for recurrence of bleeding. I have discussed the case with Dr. Loreta Ave of [facility]. She has a hx of known diverticulosis (Colonoscopy 2011 by Dr. Leone Payor, also with 3 polyps removed). She is currently hemodynamically stable. She is agreeable to transfusion if necessary.   2) Type 2 DM: Hold PO hypoglycemics. Monitor BG q6hrs while NPO. SSI .   3) HRT: I have written for her meds, but she can take her own meds if desired.   4) Proph: SCDs, no heparin.   Code Status: full code Family Communication: none at bedside Disposition Plan: pending.    Consultants:  gastroenterology  Procedures:  colonoscopy  Antibiotics:  none  HPI/Subjective: COMFORTABLE. DENIES NAUSEA OR ABD PAIN.  Objective: Filed Vitals:   10/24/12 0950 10/24/12 0955 10/24/12 1000 10/24/12 1014  BP: 142/87 142/87 154/73 132/51  Pulse:    77  Temp:      TempSrc:      Resp: 12 21 16 10   Height:      Weight:      SpO2: 100% 100% 98% 100%    Intake/Output Summary (Last 24 hours) at 10/24/12 1024 Last data filed at 10/24/12 0649  Gross per 24 hour  Intake    905 ml  Output    800 ml  Net    105 ml   Filed Weights   10/23/12 1601 10/23/12 2223  Weight: 62.596 kg (138 lb) 65.817 kg (145 lb 1.6 oz)    Exam:   General:  Alert afebrile comfortable  Cardiovascular: s1s2  Respiratory: CTAB  Abdomen: soft NT ND BS+  Musculoskeletal: NO PEDAL  EDEMA    Data Reviewed: Basic Metabolic Panel:  Recent Labs Lab 10/23/12 1637 10/24/12 0505  NA 138 134*  K 3.6 4.0  CL 98 98  CO2 25 27  GLUCOSE 133* 124*  BUN 15 13  CREATININE 0.56 0.54  CALCIUM 10.4 9.5   Liver Function Tests:  Recent Labs Lab 10/23/12 1637 10/24/12 0505  AST 15 13  ALT 10 9  ALKPHOS 59 51  BILITOT 0.2* 0.2*  PROT 7.4 6.2  ALBUMIN 4.0 3.4*   No results found for this basename: LIPASE, AMYLASE,  in the last 168 hours No results found for this basename: AMMONIA,  in the last 168 hours CBC:  Recent Labs Lab 10/23/12 1637 10/24/12 0055 10/24/12 0505  WBC 8.7  --  8.3  NEUTROABS 5.5  --   --   HGB 11.3* 9.9* 9.5*  HCT 33.3* 29.4* 28.3*  MCV 79.1  --  79.1  PLT 353  --  332   Cardiac Enzymes: No results found for this basename: CKTOTAL, CKMB, CKMBINDEX, TROPONINI,  in the last 168 hours BNP (last 3 results) No results found for this basename: PROBNP,  in the last 8760 hours CBG:  Recent Labs Lab 10/24/12 0005 10/24/12 0619  GLUCAP 123* 105*    No results found  for this or any previous visit (from the past 240 hour(s)).   Studies: No results found.  Scheduled Meds: . [MAR HOLD] estradiol  1 patch Transdermal Custom  . [MAR HOLD] insulin aspart  0-9 Units Subcutaneous Q6H  . [facility] HOLD] MethylTESTOSTERone  1.25 mg Oral Daily  . [MAR HOLD] multivitamin with minerals  1 tablet Oral Daily  . [MAR HOLD] sodium chloride  3 mL Intravenous Q12H  . [facility] HOLD] sodium chloride  3 mL Intravenous Q12H   Continuous Infusions: . sodium chloride    . sodium chloride 500 mL (10/24/12 0936)    Principal Problem:   Bright red blood per rectum Active Problems:   Postmenopausal HRT (hormone replacement therapy)   Hx of colonic polyps   DM type 2 (diabetes mellitus, type 2)    Time spent: 25    [facility]  [facility] Pager [phone]. If 7PM-7AM, please contact night-coverage at [email or web address], password [facility] 10/24/2012,  10:24 AM  LOS: 1 day

## 2012-10-24 NOTE — Interval H&P Note (Signed)
History and Physical Interval Note:  10/24/2012 9:26 AM  Anne [name]  has presented today for surgery, with the diagnosis of gi bleed  The various methods of treatment have been discussed with the patient and family. After consideration of risks, benefits and other options for treatment, the patient has consented to  Procedure(s): COLONOSCOPY (N/A) as a surgical intervention .  The patient's history has been reviewed, patient examined, no change in status, stable for surgery.  I have reviewed the patient's chart and labs.  Questions were answered to the patient's satisfaction.     Yancey Flemings

## 2012-10-24 NOTE — H&P (View-Only) (Signed)
Cross cover [facility] Reason for Consult: Rectal bleeding. Referring Physician: ER MD  Anne [name] is an 67 y.o. female.  HPI: 67 year old white female, who was doing fairly well today, busy cleaning her house this afternoon, when she had an urge to have a BM and passed a large amount of BRB. Shortly thereafter, she passed two large clots. She had some LLQ pain but describes this as mild discomfort. There is no history of nausea, vomiting, fever, chills, melena, fever or chills. She has lost 60 lbs in the last 4 years and claims this weight loss has been intentional as she was "pre-diabetic". She has a good appetite. She has 1 BM every 1-2 days and for the last 4-5 days she has noticed a change in the caliber of the stools with some mucus in it.  She had a colonoscopy in 2011 when polyps were removed [tubular adenomas] and sigmoid diverticulosis was noted. She denies having any dizziness, syncope or near syncope with the bleeding. There is no know history of ulcers, jaundice or colitis. She uses Ibuprofen occasionally for headaches. She is scheduled to see Dr. Wanda Panosh for her yearly physical tomorrow.     Past Medical History  Diagnosis Date  . Heart murmur, systolic 07/01/2011  . Postmenopausal HRT (hormone replacement therapy) 07/01/2011    surgical menopause  . Hx of hyperglycemia 07/01/2011  . Hx of compression fracture of spine 1985    t12 after a  20 foot fall  . Hx of fracture of wrist     after fall   . 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 Hx of colonic polyps   . Early cataract     stoneburner   . High frequency hearing loss     kraus followed    Past Surgical History  Procedure Laterality Date  . Tonsillectomy  1952  . Adenoidectomy  1952  . Wrist surgery  1957    left reduction  . Compression fraction  1985    T12  . Anterior cruciate ligament repair  1993    right Murphy  . Cosmetic surgery  1999-2007    holderness blepharoplasty facial rhytidectomy mastopexy  abdominoplasty   . Dental implant    . Dilation and curettage of uterus  1977  . Total abdominal hysterectomy  2005    with cystocele repair    Social History:  reports that she has never smoked. She has never used smokeless tobacco. She reports that she drinks about 1.0 ounces of alcohol per week. She reports that she does not use illicit drugs. 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    Family history: Brother died of retroperitoneal lymphosarcoma at age 31 after returning from [place] at age 31; sister had breast cancer and ovarian cancer in her early 50's. Father died of an MI at 83, he had diabetes, thyroid disease and oral cancer. Mother is 91 and lives with her; she has dementia and hyperlipidemia.     Allergies:  Allergies  Allergen Reactions  . Midazolam Other (See Comments)    Caused very crazy feelings, terrors, panic attacks, anxiety  . Zolpidem Tartrate Other (See Comments)    Caused very crazy feelings, terrors, shaking, panic attacks, anxiety  . Neomycin-Bacitracin Zn-Polymyx Itching and Rash   Medications: I have reviewed the patient's current medications.  Results for orders placed during the hospital   encounter of 10/23/12 (from the past 48 hour(s))  CBC WITH DIFFERENTIAL     Status: Abnormal   Collection Time    10/23/12  4:37 PM      Result Value Range   WBC 8.7  4.0 - 10.5 K/uL   RBC 4.21  3.87 - 5.11 MIL/uL   Hemoglobin 11.3 (*) 12.0 - 15.0 g/dL   HCT 33.3 (*) 36.0 - 46.0 %   MCV 79.1  78.0 - 100.0 fL   MCH 26.8  26.0 - 34.0 pg   MCHC 33.9  30.0 - 36.0 g/dL   RDW 14.9  11.5 - 15.5 %   Platelets 353  150 - 400 K/uL   Neutrophils Relative % 63  43 - 77 %   Neutro Abs 5.5  1.7 - 7.7 K/uL   Lymphocytes Relative 27  12 - 46 %   Lymphs Abs 2.3  0.7 - 4.0 K/uL   Monocytes Relative 6  3 - 12 %   Monocytes Absolute 0.5  0.1 - 1.0 K/uL   Eosinophils Relative 4  0 - 5 %    Eosinophils Absolute 0.3  0.0 - 0.7 K/uL   Basophils Relative 1  0 - 1 %   Basophils Absolute 0.1  0.0 - 0.1 K/uL  COMPREHENSIVE METABOLIC PANEL     Status: Abnormal   Collection Time    10/23/12  4:37 PM      Result Value Range   Sodium 138  135 - 145 mEq/L   Potassium 3.6  3.5 - 5.1 mEq/L   Chloride 98  96 - 112 mEq/L   CO2 25  19 - 32 mEq/L   Glucose, Bld 133 (*) 70 - 99 mg/dL   BUN 15  6 - 23 mg/dL   Creatinine, Ser 0.56  0.50 - 1.10 mg/dL   Calcium 10.4  8.4 - 10.5 mg/dL   Total Protein 7.4  6.0 - 8.3 g/dL   Albumin 4.0  3.5 - 5.2 g/dL   AST 15  0 - 37 U/L   ALT 10  0 - 35 U/L   Alkaline Phosphatase 59  39 - 117 U/L   Total Bilirubin 0.2 (*) 0.3 - 1.2 mg/dL   GFR calc non Af Amer >90  >90 mL/min   GFR calc Af Amer >90  >90 mL/min   Comment:            The eGFR has been calculated     using the CKD EPI equation.     This calculation has not been     validated in all clinical     situations.     eGFR's persistently     <90 mL/min signify     possible Chronic Kidney Disease.  APTT     Status: None   Collection Time    10/23/12  4:37 PM      Result Value Range   aPTT 28  24 - 37 seconds  PROTIME-INR     Status: None   Collection Time    10/23/12  4:37 PM      Result Value Range   Prothrombin Time 13.2  11.6 - 15.2 seconds   INR 1.02  0.00 - 1.49  TYPE AND SCREEN     Status: None   Collection Time    10/23/12  4:40 PM      Result Value Range   ABO/RH(D) O POS     Antibody Screen NEG     Sample Expiration   10/26/2012     Review of Systems  Constitutional: Negative.   Eyes: Negative.   Respiratory: Negative.   Cardiovascular: Negative.   Gastrointestinal: Positive for abdominal pain, constipation and blood in stool. Negative for heartburn, diarrhea and melena.  Genitourinary: Negative.   Musculoskeletal: Negative.   Skin: Negative.   Neurological: Positive for headaches.  Endo/Heme/Allergies: Negative.   Psychiatric/Behavioral: Negative.    Blood pressure  142/80, pulse 97, temperature 99.3 F (37.4 C), temperature source Oral, resp. rate 20, height 5' 4.5" (1.638 m), weight 62.596 kg (138 lb), SpO2 97.00%. Physical Exam  Constitutional: She is oriented to person, place, and time. She appears well-developed and well-nourished.  HENT:  Head: Normocephalic and atraumatic.  Eyes: Conjunctivae and EOM are normal. Pupils are equal, round, and reactive to light.  Neck: Normal range of motion. Neck supple.  Cardiovascular: Normal rate and regular rhythm.   Respiratory: Effort normal and breath sounds normal.  GI: Soft. Bowel sounds are normal. She exhibits no distension and no mass. There is tenderness. There is no rebound and no guarding.  Musculoskeletal: Normal range of motion.  Neurological: She is alert and oriented to person, place, and time.  Skin: Skin is warm and dry.  Psychiatric: She has a normal mood and affect. Her behavior is normal. Judgment and thought content normal.   Assessment/Plan: 1) Rectal bleeding with mild anemia- suspect this is a diverticular bleed: will prep for a colonoscopy tomorrow. Will allow patient some clears and follow serial CBC's.  2) History of colonic polyps and sigmoid diverticulosis. 3) AODM  4) High frequency hearing loss.     Himani Corona 10/23/2012, 8:05 PM      

## 2012-10-24 NOTE — Op Note (Signed)
[facility]) [address] [place], [address]   COLONOSCOPY PROCEDURE REPORT  PATIENT: Anne [name], Anne [name]  MR#: [id number] BIRTHDATE: 02-02-1946 , 66  yrs. old GENDER: Female ENDOSCOPIST: Roxy Cedar, MD REFERRED AO:[facility] PROCEDURE DATE:  10/24/2012 PROCEDURE:   Colonoscopy, diagnostic ASA CLASS:   Class II INDICATIONS:Rectal Bleeding. MEDICATIONS: Fentanyl 75 mcg IV and Diazepam (Valium) 7.5 mg IV  DESCRIPTION OF PROCEDURE:   After the risks benefits and alternatives of the procedure were thoroughly explained, informed consent was obtained.  A digital rectal exam revealed no abnormalities of the rectum.   The Pentax Adult Colon [id number] endoscope was introduced through the anus and advanced to the sigmoid colon. No adverse events experienced.   The quality of the prep was poor, using Colyte  The instrument was then slowly withdrawn as the colon was fully examined.      COLON FINDINGS: Poor prep.  Formed stool.  Could not evaluate the colon.  Procedure aborted.  Retroflexion was not performed. The time to cecum=  .  Withdrawal time=  .  The scope was withdrawn and the procedure completed. COMPLICATIONS: There were no complications.  ENDOSCOPIC IMPRESSION: 1. Poor prep.  Formed stool.  Could not evaluate the colon. Procedure aborted  RECOMMENDATIONS: 1. Reprep today for Colonoscopy tomorrow   eSigned:  Roxy Cedar, MD 10/24/2012 10:13 AM   cc: Stan Head, MD, Madelin Headings, MD, and The Patient

## 2012-10-24 NOTE — Progress Notes (Signed)
Anne [name] [id number] Admitted to 4U98: 10/24/2012 5:53 AM Attending Provider: Kathlen Mody, MD    Anne [name] is a 67 y.o. female patient admitted from ED awake, alert  & orientated  X 3,  VSS - Blood pressure 158/72, pulse 90, temperature 98.7 F (37.1 C), temperature source Oral, resp. rate 16, height 5' 4.8" (1.646 m), weight 65.817 kg (145 lb 1.6 oz), SpO2 99.00%., O2    Room air, no c/o shortness of breath, no c/o chest pain, no distress noted. Tele # [id number] placed and pt is currently running:normal sinus rhythm.   IV site WDL:1-  forearm left, condition patent and no redness with a transparent dsg that's clean dry and intact.  2- Na lock left forearm clean dry intact. Allergies:   Allergies  Allergen Reactions  . Midazolam Other (See Comments)    Caused very crazy feelings, terrors, panic attacks, anxiety  . Zolpidem Tartrate Other (See Comments)    Caused very crazy feelings, terrors, shaking, panic attacks, anxiety  . Neomycin-Bacitracin Zn-Polymyx Itching and Rash     Past Medical History  Diagnosis Date  . Heart murmur, systolic 07/01/2011  . Postmenopausal HRT (hormone replacement therapy) 07/01/2011    surgical menopause  . Hx of hyperglycemia 07/01/2011  . Hx of compression fracture of spine 1985    t12 after a  20 foot fall  . Hx of fracture of wrist     after fall   . 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 Hx of colonic polyps   . Early cataract     stoneburner   . High frequency hearing loss     [facility] followed   . Diabetes mellitus without complication     History:  obtained from the patient.  Pt orientation to unit, room and routine. Information packet given to patient/family and safety video watched.  Admission INP armband ID verified with patient/family, and in place. SR up x 2, fall risk assessment complete with Patient and family verbalizing understanding of risks associated with falls. Pt verbalizes an understanding of how to use the call bell  and to call for help before getting out of bed.  Skin, clean-dry- intact without evidence of bruising, or skin tears.   No evidence of skin break down noted on exam.    Will cont to monitor and assist as needed.  Tessie Eke, RN 10/24/2012 5:53 AM

## 2012-10-25 ENCOUNTER — Inpatient Hospital Stay (HOSPITAL_COMMUNITY): Payer: 59

## 2012-10-25 ENCOUNTER — Encounter (HOSPITAL_COMMUNITY)
Admission: EM | Disposition: A | Discharge status: Home / Self Care | Payer: Self-pay | Source: Home / Self Care | Attending: Internal Medicine

## 2012-10-25 ENCOUNTER — Encounter (HOSPITAL_COMMUNITY): Payer: Self-pay | Admitting: Internal Medicine

## 2012-10-25 DIAGNOSIS — R198 Other specified symptoms and signs involving the digestive system and abdomen: Secondary | ICD-10-CM

## 2012-10-25 HISTORY — PX: COLONOSCOPY: SHX5424

## 2012-10-25 LAB — URINE CULTURE
Colony Count: NO GROWTH
Culture: NO GROWTH

## 2012-10-25 LAB — HEMOGLOBIN AND HEMATOCRIT, BLOOD
HCT: 27.8 % — ABNORMAL LOW (ref 36.0–46.0)
HCT: 26.3 % — ABNORMAL LOW (ref 36.0–46.0)
Hemoglobin: 9.3 g/dL — ABNORMAL LOW (ref 12.0–15.0)
Hemoglobin: 8.6 g/dL — ABNORMAL LOW (ref 12.0–15.0)

## 2012-10-25 LAB — GLUCOSE, CAPILLARY
Glucose-Capillary: 137 mg/dL — ABNORMAL HIGH (ref 70–99)
Glucose-Capillary: 93 mg/dL (ref 70–99)

## 2012-10-25 SURGERY — COLONOSCOPY
Anesthesia: Moderate Sedation

## 2012-10-25 MED ORDER — IOHEXOL 300 MG/ML  SOLN
100.0000 mL | Freq: Once | INTRAMUSCULAR | Status: AC | PRN
  Administered 2012-10-25: 100 mL via INTRAVENOUS

## 2012-10-25 MED ORDER — FENTANYL CITRATE 0.05 MG/ML IJ SOLN
INTRAMUSCULAR | Status: AC
  Filled 2012-10-25: qty 4

## 2012-10-25 MED ORDER — DIAZEPAM 5 MG/ML IJ SOLN
INTRAMUSCULAR | Status: DC | PRN
  Administered 2012-10-25: 5 mg via INTRAVENOUS
  Administered 2012-10-25 (×2): 2 mg via INTRAVENOUS
  Administered 2012-10-25: 1 mg via INTRAVENOUS

## 2012-10-25 MED ORDER — DIAZEPAM 5 MG/ML IJ SOLN
INTRAMUSCULAR | Status: AC
  Filled 2012-10-25: qty 4

## 2012-10-25 MED ORDER — SODIUM CHLORIDE 0.9 % IV SOLN: INTRAVENOUS | Status: DC

## 2012-10-25 MED ORDER — IOHEXOL 300 MG/ML  SOLN
25.0000 mL | INTRAMUSCULAR | Status: AC
  Administered 2012-10-25 (×2): 25 mL via ORAL

## 2012-10-25 MED ORDER — METOCLOPRAMIDE HCL 10 MG PO TABS
10.0000 mg | ORAL_TABLET | Freq: Once | ORAL | Status: AC
  Administered 2012-10-25: 10 mg via ORAL
  Filled 2012-10-25: qty 1

## 2012-10-25 MED ORDER — DEXTROSE-NACL 5-0.9 % IV SOLN
INTRAVENOUS | Status: DC
  Administered 2012-10-25 – 2012-10-26 (×2): via INTRAVENOUS

## 2012-10-25 MED ORDER — FENTANYL CITRATE 0.05 MG/ML IJ SOLN
INTRAMUSCULAR | Status: DC | PRN
  Administered 2012-10-25 (×2): 25 ug via INTRAVENOUS
  Administered 2012-10-25: 50 ug via INTRAVENOUS

## 2012-10-25 NOTE — Consult Note (Signed)
Reason for Consult:rectal mass Referring Physician: Dr. 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  She held pressure and called out for sister to notify the ambulance.  Her hemoglobin was stable at 11.3.  Prior to symptoms she noticed increase in gas which started on the 14th-15th. LLQ tenderness and bloating.  This improved with probiotics and activia.  She denies unintentional weight loss.  She has been on a diet for 4 years and has lost 65lbs.  She denies change in bowel pattern.  Denies weakness, nausea or vomiting.  She had a colonoscopy about 4 years ago by Dr. Hendricks Milo which revealed polyps.  She denies a family history of colon cancer.  She has a sister that was diagnosed with ovarian cancer 15 years ago and breast cancer subsequently.  She underwent a colonoscopy yesterday, repeated today due to poor prep.  She was found to had a large rectal mass, nearly obstructing.  The patient states during her preps, she was able to pass small stools, no further blood.   She denies dizziness, shortness of breath or weakness.  Past Medical History  Diagnosis Date  . Heart murmur, systolic 07/01/2011  . Postmenopausal HRT (hormone replacement therapy) 07/01/2011    surgical menopause  . Hx of hyperglycemia 07/01/2011  . Hx of compression fracture of spine 1985    t12 after a  20 foot fall  . Hx of fracture of wrist     after fall   . 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 Hx of colonic polyps   . Early cataract     stoneburner   . High frequency hearing loss     [facility] followed   . Diabetes mellitus without complication     Past Surgical History  Procedure Laterality Date  . Tonsillectomy  1952  . Adenoidectomy  1952  . Wrist surgery  1957    left reduction  .  Compression fraction  1985    T12  . Anterior cruciate ligament repair  1993    right Murphy  . Cosmetic surgery  1999-2007    holderness blepharoplasty facial rhytidectomy mastopexy abdominoplasty   . Dental implant    . Dilation and curettage of uterus  1977  . Total abdominal hysterectomy  2005    with cystocele repair   . Colonoscopy N/A 10/24/2012    Procedure: COLONOSCOPY;  Surgeon: Hilarie Fredrickson, MD;  Location: [facility] ENDOSCOPY;  Service: Endoscopy;  Laterality: N/A;    Family History  Problem Relation Age of Onset  . Diabetes Father   . Cancer Father     oral  . Thyroid disease Father   . Heart attack Father     died age 39  . Dementia Mother   . Hypertension Mother   . Hyperlipidemia Mother   . Cancer Brother     retro lymphosarcoma agent orange  . Cancer Sister     clear cell ovarina and breast insitu  . Obesity      siblings   . Diabetes Brother   . Alcohol abuse Brother     hx of  . Other      arrythmia   . Hypertension Sister   . Asthma Sister     Social History:  reports that she  has never smoked. She has never used smokeless tobacco. She reports that she drinks about 1.0 ounces of alcohol per week. She reports that she does not use illicit drugs. 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  2 children   Allergies:  Allergies  Allergen Reactions  . Midazolam Other (See Comments)    Caused very crazy feelings, terrors, panic attacks, anxiety  . Zolpidem Tartrate Other (See Comments)    Caused very crazy feelings, terrors, shaking, panic attacks, anxiety  . Neomycin-Bacitracin Zn-Polymyx Itching and Rash    Medications: medication reviewed  Results for orders placed during the hospital encounter of 10/23/12 (from the past 48 hour(s))  CBC WITH DIFFERENTIAL     Status: Abnormal   Collection Time    10/23/12  4:37 PM      Result Value Range   WBC 8.7  4.0 - 10.5 K/uL   RBC 4.21  3.87 - 5.11 MIL/uL   Hemoglobin 11.3 (*) 12.0 - 15.0 g/dL   HCT  84.1 (*) 32.4 - 46.0 %   MCV 79.1  78.0 - 100.0 fL   MCH 26.8  26.0 - 34.0 pg   MCHC 33.9  30.0 - 36.0 g/dL   RDW 40.1  02.7 - 25.3 %   Platelets 353  150 - 400 K/uL   Neutrophils Relative % 63  43 - 77 %   Neutro Abs 5.5  1.7 - 7.7 K/uL   Lymphocytes Relative 27  12 - 46 %   Lymphs Abs 2.3  0.7 - 4.0 K/uL   Monocytes Relative 6  3 - 12 %   Monocytes Absolute 0.5  0.1 - 1.0 K/uL   Eosinophils Relative 4  0 - 5 %   Eosinophils Absolute 0.3  0.0 - 0.7 K/uL   Basophils Relative 1  0 - 1 %   Basophils Absolute 0.1  0.0 - 0.1 K/uL  COMPREHENSIVE METABOLIC PANEL     Status: Abnormal   Collection Time    10/23/12  4:37 PM      Result Value Range   Sodium 138  135 - 145 mEq/L   Potassium 3.6  3.5 - 5.1 mEq/L   Chloride 98  96 - 112 mEq/L   CO2 25  19 - 32 mEq/L   Glucose, Bld 133 (*) 70 - 99 mg/dL   BUN 15  6 - 23 mg/dL   Creatinine, Ser 6.64  0.50 - 1.10 mg/dL   Calcium 40.3  8.4 - 47.4 mg/dL   Total Protein 7.4  6.0 - 8.3 g/dL   Albumin 4.0  3.5 - 5.2 g/dL   AST 15  0 - 37 U/L   ALT 10  0 - 35 U/L   Alkaline Phosphatase 59  39 - 117 U/L   Total Bilirubin 0.2 (*) 0.3 - 1.2 mg/dL   GFR calc non Af Amer >90  >90 mL/min   GFR calc Af Amer >90  >90 mL/min   Comment:            The eGFR has been calculated     using the CKD EPI equation.     This calculation has not been     validated in all clinical     situations.     eGFR's persistently     <90 mL/min signify     possible Chronic Kidney Disease.  APTT     Status: None   Collection Time    10/23/12  4:37 PM      Result  Value Range   aPTT 28  24 - 37 seconds  PROTIME-INR     Status: None   Collection Time    10/23/12  4:37 PM      Result Value Range   Prothrombin Time 13.2  11.6 - 15.2 seconds   INR 1.02  0.00 - 1.49  TYPE AND SCREEN     Status: None   Collection Time    10/23/12  4:40 PM      Result Value Range   ABO/RH(D) O POS     Antibody Screen NEG     Sample Expiration 10/26/2012    ABO/RH     Status: None    Collection Time    10/23/12  4:40 PM      Result Value Range   ABO/RH(D) O POS    GLUCOSE, CAPILLARY     Status: Abnormal   Collection Time    10/24/12 12:05 AM      Result Value Range   Glucose-Capillary 123 (*) 70 - 99 mg/dL   Comment 1 Notify RN    HEMOGLOBIN AND HEMATOCRIT, BLOOD     Status: Abnormal   Collection Time    10/24/12 12:55 AM      Result Value Range   Hemoglobin 9.9 (*) 12.0 - 15.0 g/dL   HCT 96.0 (*) 45.4 - 09.8 %  URINALYSIS, ROUTINE W REFLEX MICROSCOPIC     Status: Abnormal   Collection Time    10/24/12  2:24 AM      Result Value Range   Color, Urine STRAW (*) YELLOW   APPearance CLOUDY (*) CLEAR   Specific Gravity, Urine 1.011  1.005 - 1.030   pH 7.0  5.0 - 8.0   Glucose, UA NEGATIVE  NEGATIVE mg/dL   Hgb urine dipstick SMALL (*) NEGATIVE   Bilirubin Urine NEGATIVE  NEGATIVE   Ketones, ur 15 (*) NEGATIVE mg/dL   Protein, ur NEGATIVE  NEGATIVE mg/dL   Urobilinogen, UA 0.2  0.0 - 1.0 mg/dL   Nitrite NEGATIVE  NEGATIVE   Leukocytes, UA SMALL (*) NEGATIVE  URINE MICROSCOPIC-ADD ON     Status: Abnormal   Collection Time    10/24/12  2:24 AM      Result Value Range   Squamous Epithelial / LPF FEW (*) RARE   WBC, UA 3-6  <3 WBC/hpf   RBC / HPF 0-2  <3 RBC/hpf   Bacteria, UA FEW (*) RARE   Casts HYALINE CASTS (*) NEGATIVE   Urine-Other AMORPHOUS URATES/PHOSPHATES    URINE CULTURE     Status: None   Collection Time    10/24/12  2:24 AM      Result Value Range   Specimen Description URINE, CLEAN CATCH     Special Requests ADDED 0255     Culture  Setup Time 10/24/2012 03:22     Colony Count NO GROWTH     Culture NO GROWTH     Report Status 10/25/2012 FINAL    COMPREHENSIVE METABOLIC PANEL     Status: Abnormal   Collection Time    10/24/12  5:05 AM      Result Value Range   Sodium 134 (*) 135 - 145 mEq/L   Potassium 4.0  3.5 - 5.1 mEq/L   Chloride 98  96 - 112 mEq/L   CO2 27  19 - 32 mEq/L   Glucose, Bld 124 (*) 70 - 99 mg/dL   BUN 13  6 - 23  mg/dL   Creatinine, Ser 1.19  0.50 - 1.10 mg/dL   Calcium 9.5  8.4 - 62.1 mg/dL   Total Protein 6.2  6.0 - 8.3 g/dL   Albumin 3.4 (*) 3.5 - 5.2 g/dL   AST 13  0 - 37 U/L   ALT 9  0 - 35 U/L   Alkaline Phosphatase 51  39 - 117 U/L   Total Bilirubin 0.2 (*) 0.3 - 1.2 mg/dL   GFR calc non Af Amer >90  >90 mL/min   GFR calc Af Amer >90  >90 mL/min   Comment:            The eGFR has been calculated     using the CKD EPI equation.     This calculation has not been     validated in all clinical     situations.     eGFR's persistently     <90 mL/min signify     possible Chronic Kidney Disease.  CBC     Status: Abnormal   Collection Time    10/24/12  5:05 AM      Result Value Range   WBC 8.3  4.0 - 10.5 K/uL   RBC 3.58 (*) 3.87 - 5.11 MIL/uL   Hemoglobin 9.5 (*) 12.0 - 15.0 g/dL   HCT 30.8 (*) 65.7 - 84.6 %   MCV 79.1  78.0 - 100.0 fL   MCH 26.5  26.0 - 34.0 pg   MCHC 33.6  30.0 - 36.0 g/dL   RDW 96.2  95.2 - 84.1 %   Platelets 332  150 - 400 K/uL  PROTIME-INR     Status: None   Collection Time    10/24/12  5:05 AM      Result Value Range   Prothrombin Time 13.4  11.6 - 15.2 seconds   INR 1.04  0.00 - 1.49  APTT     Status: None   Collection Time    10/24/12  5:05 AM      Result Value Range   aPTT 28  24 - 37 seconds  GLUCOSE, CAPILLARY     Status: Abnormal   Collection Time    10/24/12  6:19 AM      Result Value Range   Glucose-Capillary 105 (*) 70 - 99 mg/dL   Comment 1 Notify RN    GLUCOSE, CAPILLARY     Status: Abnormal   Collection Time    10/24/12 12:07 PM      Result Value Range   Glucose-Capillary 105 (*) 70 - 99 mg/dL  HEMOGLOBIN AND HEMATOCRIT, BLOOD     Status: Abnormal   Collection Time    10/24/12  3:28 PM      Result Value Range   Hemoglobin 9.4 (*) 12.0 - 15.0 g/dL   HCT 32.4 (*) 40.1 - 02.7 %  GLUCOSE, CAPILLARY     Status: Abnormal   Collection Time    10/24/12  5:05 PM      Result Value Range   Glucose-Capillary 114 (*) 70 - 99 mg/dL   HEMOGLOBIN AND HEMATOCRIT, BLOOD     Status: Abnormal   Collection Time    10/24/12  6:53 PM      Result Value Range   Hemoglobin 9.7 (*) 12.0 - 15.0 g/dL   HCT 25.3 (*) 66.4 - 40.3 %  GLUCOSE, CAPILLARY     Status: Abnormal   Collection Time    10/25/12 12:36 AM      Result Value Range   Glucose-Capillary 137 (*) 70 -  99 mg/dL   Comment 1 Notify RN    HEMOGLOBIN AND HEMATOCRIT, BLOOD     Status: Abnormal   Collection Time    10/25/12  2:35 AM      Result Value Range   Hemoglobin 9.3 (*) 12.0 - 15.0 g/dL   HCT 21.3 (*) 08.6 - 57.8 %  GLUCOSE, CAPILLARY     Status: None   Collection Time    10/25/12  6:32 AM      Result Value Range   Glucose-Capillary 93  70 - 99 mg/dL   Comment 1 Notify RN    HEMOGLOBIN AND HEMATOCRIT, BLOOD     Status: Abnormal   Collection Time    10/25/12 10:32 AM      Result Value Range   Hemoglobin 8.6 (*) 12.0 - 15.0 g/dL   HCT 46.9 (*) 62.9 - 52.8 %  GLUCOSE, CAPILLARY     Status: Abnormal   Collection Time    10/25/12 12:12 PM      Result Value Range   Glucose-Capillary 100 (*) 70 - 99 mg/dL    Review of Systems  Constitutional: Negative for fever, chills, weight loss and malaise/fatigue.  Respiratory: Negative for shortness of breath and wheezing.   Cardiovascular: Negative for chest pain, palpitations and leg swelling.  Gastrointestinal: Positive for blood in stool. Negative for heartburn, nausea, vomiting, abdominal pain, diarrhea and constipation.  Genitourinary: Negative for dysuria, urgency, frequency and hematuria.  Neurological: Negative for dizziness, sensory change, speech change, focal weakness, seizures, loss of consciousness and weakness.  Psychiatric/Behavioral: Negative for substance abuse. The patient is not nervous/anxious.    Blood pressure 153/67, pulse 88, temperature 97.8 F (36.6 C), temperature source Oral, resp. rate 21, height 5' 4.8" (1.646 m), weight 144 lb (65.318 kg), SpO2 99.00%. Physical Exam  Constitutional:  She is oriented to person, place, and time. She appears well-developed and well-nourished. No distress.  HENT:  Head: Normocephalic and atraumatic.  Neck: Normal range of motion. Neck supple.  Cardiovascular: Normal rate, regular rhythm, normal heart sounds and intact distal pulses.  Exam reveals no gallop and no friction rub.   No murmur heard. Respiratory: Effort normal and breath sounds normal. No respiratory distress. She has no wheezes. She has no rales. She exhibits no tenderness.  GI: Soft. Bowel sounds are normal. She exhibits no distension and no mass. There is no tenderness. There is no rebound and no guarding.  Genitourinary: Rectal exam shows mass. Rectal exam shows no tenderness and anal tone normal.  Musculoskeletal: She exhibits no edema and no tenderness.  Neurological: She is alert and oriented to person, place, and time.  Skin: Skin is warm and dry. No rash noted. She is not diaphoretic. No erythema. No pallor.  Psychiatric: She has a normal mood and affect. Her behavior is normal. Judgment and thought content normal.    Assessment/Plan: GI bleeding Rectal Mass Anne Chapel, PA had a lengthy discussion regarding further evaluation, differential diagnosis and concerns for malignancy.  Dr. Marina Goodell has recommended a CT of abdomen and pelvis, we will add a CT of chest.  We will await final pathology report.  The concern is the mass obstructing the patient which requires urgent intervention as opposed to outpatient follow up and work up.  Dr. Janee Morn to discuss with Dr. Romie Levee.  The patient may also want a second opinion by colorectal specialty, but agrees to wait for pathology and radiologic imaging.  She is hemodynamically.  H&h are 8.6/26.3.  She denies  further episodes of hematochezia.  Recommend continuing with clear liquid diet for now.  Further recommendations will be based on pathology and discussion with Dr. Maisie Fus.  Thank you for the kind consult.  Surgery will  continue to follow.  Anne [name]  ANP-BC Pager [phone]  10/25/2012, 3:55 PM

## 2012-10-25 NOTE — Consult Note (Signed)
Suspected rectal cancer at 4cm.  Will check CT chest abdomen and pelvis. Path pending.  Patient will need endorectal U/S.  I will discuss with Dr. Maisie Fus, the colorectal specialist at our practice. I anticipate neoadjuvant therapy prior to surgery. I spoke to [name] at length. Patient examined and I agree with the assessment and plan  Violeta Gelinas, MD, MPH, FACS Pager: [phone]  10/25/2012 5:06 PM

## 2012-10-25 NOTE — Op Note (Signed)
[facility] [address] [place], [address]   COLONOSCOPY PROCEDURE REPORT  PATIENT: Anne [name], Anne [name]  MR#: [id number] BIRTHDATE: [birth year] , 66  yrs. old GENDER: Female ENDOSCOPIST: Roxy Cedar, MD REFERRED XB:[id number] PROCEDURE DATE:  10/25/2012 PROCEDURE:   Colonoscopy with biopsy ASA CLASS:   Class II INDICATIONS:Rectal Bleeding.  Prior colonoscopy December 2011 with 2 subcentimeter adenomas and diverticulosis MEDICATIONS: Fentanyl 75 mcg IV and Diazepam (Valium) 7 mg IV    DESCRIPTION OF PROCEDURE:   After the risks benefits and alternatives of the procedure were thoroughly explained, informed consent was obtained.  A digital rectal exam revealed no abnormalities of the rectum.   The Pentax Adult Colon [id number] endoscope was introduced through the anus and advanced to the rectum. No adverse events experienced.   Limited by an obstruction. The quality of the prep was excellent, using MoviPrep  The instrument was then slowly withdrawn as the colon was fully examined.     COLON FINDINGS: There was a bulky, circumferential, friable mass lesion in the rectum at approximately 4 cm above the dentate line. 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  Multiple biopsies taken.  Retroflexed views revealed no abnormalities. The time to cecum=    .  Withdrawal time=    .  The scope was withdrawn and the procedure completed.  COMPLICATIONS: There were no complications.  ENDOSCOPIC IMPRESSION: 1.There was a bulky, circumferential, friable mass lesion in the rectum at approximately 4 cm above the dentate line.  This was subtotally obstructing and would not permit passage of the colonoscope proximal.  Multiple biopsies taken.  RECOMMENDATIONS: 1.  Await biopsy results 2.  CT scan of abdomen and pelvis "rectal mass, rule out  metastases" 3.  General surgical consult (called)   eSigned:  Roxy Cedar, MD 10/25/2012 3:12 PM   cc: Madelin Headings, MD, Stan Head, MD, and The Patient

## 2012-10-25 NOTE — Progress Notes (Signed)
Tap Water enema given as ordered.  Pt. Tolerated well.  Obtained only clear watery liquids back.  Will continue to monitor.  Forbes Cellar, RN

## 2012-10-25 NOTE — Progress Notes (Signed)
     [facility] Daily Rounding Note 10/25/2012, 9:41 AM  SUBJECTIVE:       Completed the second portion of moviprep, stools still murky, nothing solid and no blood whatsoever.   No pain.    OBJECTIVE:         Vital signs in last 24 hours:    Temp:  [97.4 F (36.3 C)-98.3 F (36.8 C)] 97.5 F (36.4 C) (07/29 0635) Pulse Rate:  [74-85] 85 (07/29 0635) Resp:  [10-21] 17 (07/29 0635) BP: (129-168)/(49-87) 137/80 mmHg (07/29 0635) SpO2:  [96 %-100 %] 100 % (07/29 0635) Weight:  [65.318 kg (144 lb)] 65.318 kg (144 lb) (07/29 0635) Last BM Date: 10/25/12 General: NAD, comfortable.   Relaxed.  No confusion Did not reexamine pt.    Intake/Output from previous day: 07/28 0701 - 07/29 0700 In: 2120 [P.O.:1920; I.V.:200] Out: 1050 [Urine:1050]  Intake/Output this shift:    Lab Results:  Recent Labs  10/23/12 1637  10/24/12 0505 10/24/12 1528 10/24/12 1853 10/25/12 0235  WBC 8.7  --  8.3  --   --   --   HGB 11.3*  < > 9.5* 9.4* 9.7* 9.3*  HCT 33.3*  < > 28.3* 28.3* 28.9* 27.8*  PLT 353  --  332  --   --   --   < > = values in this interval not displayed. BMET  Recent Labs  10/23/12 1637 10/24/12 0505  NA 138 134*  K 3.6 4.0  CL 98 98  CO2 25 27  GLUCOSE 133* 124*  BUN 15 13  CREATININE 0.56 0.54  CALCIUM 10.4 9.5   LFT  Recent Labs  10/23/12 1637 10/24/12 0505  PROT 7.4 6.2  ALBUMIN 4.0 3.4*  AST 15 13  ALT 10 9  ALKPHOS 59 51  BILITOT 0.2* 0.2*   PT/INR  Recent Labs  10/23/12 1637 10/24/12 0505  LABPROT 13.2 13.4  INR 1.02 1.04    ASSESMENT: *  Hematochezia, painless.  Probable diverticular bleed.  Bleeding had stopped.  *  Constipation.  Still not clear after second bowel prep.   *  Type 2 DM   PLAN: *  Tap water enema(s).  This AM.  Colonoscopy today.    LOS: 2 days   Anne [name]  10/25/2012, 9:41 AM Pager: [phone]

## 2012-10-25 NOTE — Progress Notes (Signed)
[facility] PROGRESS NOTE  Anne [name] JYN:[id number] DOB: October 15, 1945 DOA: 10/23/2012 PCP: [name], MD  Brief HPI; 66 yr. Old WF w/ hx NIDDM type 2, hx diverticulosis, hx compression fracture, on HRT, presents with BRBPR. In the ED, she was noted to have traces of BRB near her rectum per the ED attending. GI consulted, and plan for colonoscopy on 7/18, but it was a poor prep and she is scheduled for colonoscopy on 7/29. She underwent a colonoscopy today , was found to have 4 cm mass. Surgery consulted.    Assessment/Plan:  1.  BRBPR: This is likely a diverticular bleed. She will be admitted to telemetry and monitored for recurrence of bleeding. I have discussed the case with Dr. Loreta Ave of [facility]. She has a hx of known diverticulosis (Colonoscopy 2011 by Dr. Leone Payor, also with 3 polyps removed). She is currently hemodynamically stable. She is agreeable to transfusion if necessary. She underwent colonoscopy on 7/29 was found to have 4 cm mass possibly adenocarcinoma. Surgery consulted.   2) Type 2 DM: Hold PO hypoglycemics. Monitor BG q6hrs while NPO. SSI .   3) HRT: I have written for her meds, but she can take her own meds if desired.   4) Proph: SCDs, no heparin.   Code Status: full code Family Communication: none at bedside Disposition Plan: pending.    Consultants:  Gastroenterology  Surgery.  Procedures:  colonoscopy  Antibiotics:  none  HPI/Subjective: COMFORTABLE. DENIES NAUSEA OR ABD PAIN.  Objective: Filed Vitals:   10/25/12 1500 10/25/12 1505 10/25/12 1510 10/25/12 1520  BP: 135/47  140/49 153/67  Pulse: 79  79 88  Temp:  97.8 F (36.6 C)    TempSrc:  Oral    Resp: 9  12 21   Height:      Weight:      SpO2: 98%  100% 99%    Intake/Output Summary (Last 24 hours) at 10/25/12 2030 Last data filed at 10/25/12 1848  Gross per 24 hour  Intake      0 ml  Output      0 ml  Net      0 ml   Filed Weights   10/23/12 1601 10/23/12  2223 10/25/12 0635  Weight: 62.596 kg (138 lb) 65.817 kg (145 lb 1.6 oz) 65.318 kg (144 lb)    Exam:   General:  Alert afebrile comfortable      Data Reviewed: Basic Metabolic Panel:  Recent Labs Lab 10/23/12 1637 10/24/12 0505  NA 138 134*  K 3.6 4.0  CL 98 98  CO2 25 27  GLUCOSE 133* 124*  BUN 15 13  CREATININE 0.56 0.54  CALCIUM 10.4 9.5   Liver Function Tests:  Recent Labs Lab 10/23/12 1637 10/24/12 0505  AST 15 13  ALT 10 9  ALKPHOS 59 51  BILITOT 0.2* 0.2*  PROT 7.4 6.2  ALBUMIN 4.0 3.4*   No results found for this basename: LIPASE, AMYLASE,  in the last 168 hours No results found for this basename: AMMONIA,  in the last 168 hours CBC:  Recent Labs Lab 10/23/12 1637  10/24/12 0505 10/24/12 1528 10/24/12 1853 10/25/12 0235 10/25/12 1032 10/25/12 1937  WBC 8.7  --  8.3  --   --   --   --   --   NEUTROABS 5.5  --   --   --   --   --   --   --   HGB 11.3*  < > 9.5* 9.4*  9.7* 9.3* 8.6* 10.0*  HCT 33.3*  < > 28.3* 28.3* 28.9* 27.8* 26.3* 30.1*  MCV 79.1  --  79.1  --   --   --   --   --   PLT 353  --  332  --   --   --   --   --   < > = values in this interval not displayed. Cardiac Enzymes: No results found for this basename: CKTOTAL, CKMB, CKMBINDEX, TROPONINI,  in the last 168 hours BNP (last 3 results) No results found for this basename: PROBNP,  in the last 8760 hours CBG:  Recent Labs Lab 10/24/12 1705 10/25/12 0036 10/25/12 0632 10/25/12 1212 10/25/12 1808  GLUCAP 114* 137* 93 100* 93    Recent Results (from the past 240 hour(s))  URINE CULTURE     Status: None   Collection Time    10/24/12  2:24 AM      Result Value Range Status   Specimen Description URINE, CLEAN CATCH   Final   Special Requests ADDED 0255   Final   Culture  Setup Time 10/24/2012 03:22   Final   Colony Count NO GROWTH   Final   Culture NO GROWTH   Final   Report Status 10/25/2012 FINAL   Final     Studies: No results found.  Scheduled Meds: .  bisacodyl  5 mg Oral Once  . [START ON 10/27/2012] estradiol  1 patch Transdermal Custom  . insulin aspart  0-9 Units Subcutaneous Q6H  . MethylTESTOSTERone  1.25 mg Oral Daily  . multivitamin with minerals  1 tablet Oral Daily  . sodium chloride  3 mL Intravenous Q12H   Continuous Infusions:    Principal Problem:   Bright red blood per rectum Active Problems:   Postmenopausal HRT (hormone replacement therapy)   Hx of colonic polyps   DM type 2 (diabetes mellitus, type 2)    Time spent: 15    [facility]  [facility] Pager [phone]. If 7PM-7AM, please contact night-coverage at [email or web address], password [facility] 10/25/2012, 8:30 PM  LOS: 2 days

## 2012-10-26 ENCOUNTER — Telehealth: Payer: Self-pay

## 2012-10-26 ENCOUNTER — Encounter (HOSPITAL_COMMUNITY): Payer: Self-pay | Admitting: Internal Medicine

## 2012-10-26 DIAGNOSIS — R222 Localized swelling, mass and lump, trunk: Secondary | ICD-10-CM

## 2012-10-26 DIAGNOSIS — C20 Malignant neoplasm of rectum: Secondary | ICD-10-CM

## 2012-10-26 DIAGNOSIS — D491 Neoplasm of unspecified behavior of respiratory system: Secondary | ICD-10-CM

## 2012-10-26 DIAGNOSIS — R933 Abnormal findings on diagnostic imaging of other parts of digestive tract: Secondary | ICD-10-CM

## 2012-10-26 DIAGNOSIS — R918 Other nonspecific abnormal finding of lung field: Secondary | ICD-10-CM | POA: Diagnosis not present

## 2012-10-26 LAB — GLUCOSE, CAPILLARY
Glucose-Capillary: 262 mg/dL — ABNORMAL HIGH (ref 70–99)
Glucose-Capillary: 140 mg/dL — ABNORMAL HIGH (ref 70–99)

## 2012-10-26 NOTE — Telephone Encounter (Signed)
Cleotis Nipper [phone]) called wanting Dr Fabian Sharp to call her about  Dr Lavaeh Bau being in [facility], she is also wanting her last 2 lab results. She stated that Dr Ferd Glassing is not doing good.

## 2012-10-26 NOTE — Progress Notes (Signed)
GI ATTENDING  I reviewed the endoscopic findings with [name] last evening. CT scan results note. I spoke with Dr. Christella Hartigan Re: EUS. If lung biopsy positive for cancer, then no need for EUS. If negative for cancer, then EUS reasonable. You may contact Dr. Christella Hartigan if lung biopsy negative and EUS desired. Please call for questions. Will sign off.  Wilhemina Bonito. Eda Keys., M.D. [facility] Division of Gastroenterology

## 2012-10-26 NOTE — Progress Notes (Signed)
PATIENT DETAILS Name: Anne [name] Age: 67 y.o. Sex: female Date of Birth: 01-Jul-1945 Admit Date: 10/23/2012 Admitting Physician Jonah Blue, DO ZOX:[id number] Clayborne Dana, MD  Subjective: No major complaints  Assessment/Plan: Principal Problem:   Rectal bleeding - Do not suspect diverticular etiology, likely secondary to rectal cancer - Hemoglobin and hematocrit remained stable - Colonoscopy done on 7/29-bulky, circumferential, friable mass lesion in the rectum at approximately 4 cm above the dentate line. Biopsies are pending. - CT of the abdomen-shows local lymphadenopathy. - CT of the chest-shows a large solitary right lung mass- see below  Active Problems:  Right lung mass - Not sure whether this is a solitary metastatic lesion or a second primary tumor - PCCM consulted-a bronchoscopy tomorrow  Acute blood loss anemia - From rectal bleeding - Fortunately that the bleeding has subsided, hemoglobin and hematocrit remained stable-no need for transfusion so far.  Diabetes - CBGs stable - Continue SSI - Hold on metformin angina LDL while inpatient  Disposition: Remain inpatient  DVT Prophylaxis:  SCD's  Code Status: Full code   Family Communication None  Procedures:  None  CONSULTS:  pulmonary/intensive care and GI   MEDICATIONS: Scheduled Meds: . bisacodyl  5 mg Oral Once  . [START ON 10/27/2012] estradiol  1 patch Transdermal Custom  . insulin aspart  0-9 Units Subcutaneous Q6H  . MethylTESTOSTERone  1.25 mg Oral Daily  . multivitamin with minerals  1 tablet Oral Daily  . sodium chloride  3 mL Intravenous Q12H   Continuous Infusions: . dextrose 5 % and 0.9% NaCl 50 mL/hr at 10/25/12 2124   PRN Meds:.acetaminophen, morphine injection, ondansetron  Antibiotics: Anti-infectives   None       PHYSICAL EXAM: Vital signs in last 24 hours: Filed Vitals:   10/25/12 1510 10/25/12 1520 10/25/12 2118 10/26/12 0554  BP: 140/49 153/67 149/77  167/79  Pulse: 79 88 78 80  Temp:   98.1 F (36.7 C) 98.2 F (36.8 C)  TempSrc:   Oral Oral  Resp: 12 21 20 18   Height:      Weight:      SpO2: 100% 99% 98% 98%    Weight change:  Filed Weights   10/23/12 1601 10/23/12 2223 10/25/12 0635  Weight: 62.596 kg (138 lb) 65.817 kg (145 lb 1.6 oz) 65.318 kg (144 lb)   Body mass index is 24.11 kg/(m^2).   Gen Exam: Awake and alert with clear speech.  Neck: Supple, No JVD.   Chest: B/L Clear.   CVS: S1 S2 Regular, no murmurs.  Abdomen: soft, BS +, non tender, non distended. Extremities: no edema, lower extremities warm to touch. Neurologic: Non Focal.   Skin: No Rash.   Wounds: N/A.    Intake/Output from previous day:  Intake/Output Summary (Last 24 hours) at 10/26/12 1232 Last data filed at 10/26/12 1056  Gross per 24 hour  Intake  465.5 ml  Output      0 ml  Net  465.5 ml     LAB RESULTS: CBC  Recent Labs Lab 10/23/12 1637  10/24/12 0505  10/25/12 0235 10/25/12 1032 10/25/12 1937 10/26/12 0242 10/26/12 1154  WBC 8.7  --  8.3  --   --   --   --   --   --   HGB 11.3*  < > 9.5*  < > 9.3* 8.6* 10.0* 9.1* 9.8*  HCT 33.3*  < > 28.3*  < > 27.8* 26.3* 30.1* 26.9* 28.9*  PLT 353  --  332  --   --   --   --   --   --  MCV 79.1  --  79.1  --   --   --   --   --   --   MCH 26.8  --  26.5  --   --   --   --   --   --   MCHC 33.9  --  33.6  --   --   --   --   --   --   RDW 14.9  --  14.8  --   --   --   --   --   --   LYMPHSABS 2.3  --   --   --   --   --   --   --   --   MONOABS 0.5  --   --   --   --   --   --   --   --   EOSABS 0.3  --   --   --   --   --   --   --   --   BASOSABS 0.1  --   --   --   --   --   --   --   --   < > = values in this interval not displayed.  Chemistries   Recent Labs Lab 10/23/12 1637 10/24/12 0505  NA 138 134*  K 3.6 4.0  CL 98 98  CO2 25 27  GLUCOSE 133* 124*  BUN 15 13  CREATININE 0.56 0.54  CALCIUM 10.4 9.5    CBG:  Recent Labs Lab 10/25/12 0632 10/25/12 1212  10/25/12 1808 10/26/12 0012 10/26/12 0552  GLUCAP 93 100* 93 103* 105*    GFR Estimated Creatinine Clearance: 60 ml/min (by C-G formula based on Cr of 0.54).  Coagulation profile  Recent Labs Lab 10/23/12 1637 10/24/12 0505  INR 1.02 1.04    Cardiac Enzymes No results found for this basename: CK, CKMB, TROPONINI, MYOGLOBIN,  in the last 168 hours  No components found with this basename: POCBNP,  No results found for this basename: DDIMER,  in the last 72 hours No results found for this basename: HGBA1C,  in the last 72 hours No results found for this basename: CHOL, HDL, LDLCALC, TRIG, CHOLHDL, LDLDIRECT,  in the last 72 hours No results found for this basename: TSH, T4TOTAL, FREET3, T3FREE, THYROIDAB,  in the last 72 hours No results found for this basename: VITAMINB12, FOLATE, FERRITIN, TIBC, IRON, RETICCTPCT,  in the last 72 hours No results found for this basename: LIPASE, AMYLASE,  in the last 72 hours  Urine Studies No results found for this basename: UACOL, UAPR, USPG, UPH, UTP, UGL, UKET, UBIL, UHGB, UNIT, UROB, ULEU, UEPI, UWBC, URBC, UBAC, CAST, CRYS, UCOM, BILUA,  in the last 72 hours  MICROBIOLOGY: Recent Results (from the past 240 hour(s))  URINE CULTURE     Status: None   Collection Time    10/24/12  2:24 AM      Result Value Range Status   Specimen Description URINE, CLEAN CATCH   Final   Special Requests ADDED 0255   Final   Culture  Setup Time 10/24/2012 03:22   Final   Colony Count NO GROWTH   Final   Culture NO GROWTH   Final   Report Status 10/25/2012 FINAL   Final    RADIOLOGY STUDIES/RESULTS: Ct Chest W Contrast  10/25/2012   *RADIOLOGY REPORT*  Clinical Data:  Rectal bleeding, rectal mass at colonoscopy 10/24/2012  CT CHEST, ABDOMEN  AND PELVIS WITH CONTRAST  Technique:  Multidetector CT imaging of the chest, abdomen and pelvis was performed following the standard protocol during bolus administration of intravenous contrast.  Contrast:  OMNIPAQUE IOHEXOL 300 MG/ML  SOLN  Comparison:  CT abdomen/pelvis without contrast 09/10/2010  CT CHEST  Findings:  There is a lobulated irregular mass in the superior segment right lower lobe measuring 5.0 x 4.6 cm image 41.  Rim calcified right upper lobe 0.8 cm nodule which is centrally lucent is noted on image 25.  2 and 3 mm left intrapulmonary lymph node abutting the major fissure are noted images 29 and 31.  Central airways are patent.  Heart size is normal.  No lymphadenopathy.  No pericardial or pleural effusion.  There is mild osteopenia in the thoracic spine with multilevel disc degenerative change but no focal lytic or sclerotic osseous lesion is identified.  IMPRESSION: Irregular right lower lobe superior segment pulmonary mass which primary considerations include primary bronchogenic carcinoma or less likely solitary metastasis.  CT ABDOMEN AND PELVIS  Findings:  There is mass-like rectosigmoid colonic wall thickening measuring 2.9 cm in thickness image 102.  Adjacent pericolonic stranding and abnormal lymphadenopathy is noted, with dominant right pericolonic lymph node measuring 0.8 cm image 102.  The mass- like thickening immediately abuts the bladder but no obvious intravesical invasion is identified allowing for technique.  4 mm short axis lymph node image 85 at the iliac artery bifurcation is nonspecific but new since the prior exam as well and suspicious. Uterus and ovaries are not identified, compatible with prior total hysterectomy per report.  Small bowel and appendix are normal.  Liver, kidneys, adrenal glands, spleen, gallbladder, and pancreas are normal.  Degenerative changes are noted in the spine with areas of trabecular rarefaction but no focal identifiable osseous lesion.  IMPRESSION: Mass-like rectosigmoid wall thickening, surrounding stranding, and adjacent lymphadenopathy, compatible with malignancy with local nodal spread.  Inflammation and local reactive lymph nodes would be much  less likely.  PET CT is recommended for global staging purposes and for further evaluation of the above described lung mass, as well as surgical consultation.  These results will be called to the ordering clinician or representative by the Radiologist Assistant, and communication documented in the PACS Dashboard.   Original Report Authenticated By: Christiana Pellant, M.D.   Ct Abdomen Pelvis W Contrast  10/25/2012   *RADIOLOGY REPORT*  Clinical Data:  Rectal bleeding, rectal mass at colonoscopy 10/24/2012  CT CHEST, ABDOMEN AND PELVIS WITH CONTRAST  Technique:  Multidetector CT imaging of the chest, abdomen and pelvis was performed following the standard protocol during bolus administration of intravenous contrast.  Contrast: OMNIPAQUE IOHEXOL 300 MG/ML  SOLN  Comparison:  CT abdomen/pelvis without contrast 09/10/2010  CT CHEST  Findings:  There is a lobulated irregular mass in the superior segment right lower lobe measuring 5.0 x 4.6 cm image 41.  Rim calcified right upper lobe 0.8 cm nodule which is centrally lucent is noted on image 25.  2 and 3 mm left intrapulmonary lymph node abutting the major fissure are noted images 29 and 31.  Central airways are patent.  Heart size is normal.  No lymphadenopathy.  No pericardial or pleural effusion.  There is mild osteopenia in the thoracic spine with multilevel disc degenerative change but no focal lytic or sclerotic osseous lesion is identified.  IMPRESSION: Irregular right lower lobe superior segment pulmonary mass which primary considerations include primary bronchogenic carcinoma or less likely solitary metastasis.  CT ABDOMEN AND PELVIS  Findings:  There is mass-like rectosigmoid colonic wall thickening measuring 2.9 cm in thickness image 102.  Adjacent pericolonic stranding and abnormal lymphadenopathy is noted, with dominant right pericolonic lymph node measuring 0.8 cm image 102.  The mass- like thickening immediately abuts the bladder but no obvious  intravesical invasion is identified allowing for technique.  4 mm short axis lymph node image 85 at the iliac artery bifurcation is nonspecific but new since the prior exam as well and suspicious. Uterus and ovaries are not identified, compatible with prior total hysterectomy per report.  Small bowel and appendix are normal.  Liver, kidneys, adrenal glands, spleen, gallbladder, and pancreas are normal.  Degenerative changes are noted in the spine with areas of trabecular rarefaction but no focal identifiable osseous lesion.  IMPRESSION: Mass-like rectosigmoid wall thickening, surrounding stranding, and adjacent lymphadenopathy, compatible with malignancy with local nodal spread.  Inflammation and local reactive lymph nodes would be much less likely.  PET CT is recommended for global staging purposes and for further evaluation of the above described lung mass, as well as surgical consultation.  These results will be called to the ordering clinician or representative by the Radiologist Assistant, and communication documented in the PACS Dashboard.   Original Report Authenticated By: Christiana Pellant, M.D.    Jeoffrey Massed, MD  [facility] Pager:[phone]  If 7PM-7AM, please contact night-coverage [email or web address] Password [email or web address] 10/26/2012, 12:32 PM   LOS: 3 days

## 2012-10-26 NOTE — Progress Notes (Signed)
Patient ID: Anne [name], female   DOB: 08-Dec-1945, 67 y.o.   MRN: [id number] 1 Day Post-Op  Subjective: Pt upset initially when I walked in this morning because she feels as if all of the info she is getting is all over the place.  She wants a "lead" person controlling the situation.  Otherwise, she has no pain or further bleeding.  Objective: Vital signs in last 24 hours: Temp:  [97.8 F (36.6 C)-98.3 F (36.8 C)] 98.2 F (36.8 C) (07/30 0554) Pulse Rate:  [78-88] 80 (07/30 0554) Resp:  [9-84] 18 (07/30 0554) BP: (135-167)/(47-90) 167/79 mmHg (07/30 0554) SpO2:  [98 %-100 %] 98 % (07/30 0554) Last BM Date: 10/25/12  Intake/Output from previous day: 07/29 0701 - 07/30 0700 In: 462.5 [I.V.:462.5] Out: -  Intake/Output this shift:    PE: Abd: soft, NT, ND, +BS  Lab Results:   Recent Labs  10/23/12 1637  10/24/12 0505  10/25/12 1937 10/26/12 0242  WBC 8.7  --  8.3  --   --   --   HGB 11.3*  < > 9.5*  < > 10.0* 9.1*  HCT 33.3*  < > 28.3*  < > 30.1* 26.9*  PLT 353  --  332  --   --   --   < > = values in this interval not displayed. BMET  Recent Labs  10/23/12 1637 10/24/12 0505  NA 138 134*  K 3.6 4.0  CL 98 98  CO2 25 27  GLUCOSE 133* 124*  BUN 15 13  CREATININE 0.56 0.54  CALCIUM 10.4 9.5   PT/INR  Recent Labs  10/23/12 1637 10/24/12 0505  LABPROT 13.2 13.4  INR 1.02 1.04   CMP     Component Value Date/Time   NA 134* 10/24/2012 0505   K 4.0 10/24/2012 0505   CL 98 10/24/2012 0505   CO2 27 10/24/2012 0505   GLUCOSE 124* 10/24/2012 0505   BUN 13 10/24/2012 0505   CREATININE 0.54 10/24/2012 0505   CALCIUM 9.5 10/24/2012 0505   PROT 6.2 10/24/2012 0505   ALBUMIN 3.4* 10/24/2012 0505   AST 13 10/24/2012 0505   ALT 9 10/24/2012 0505   ALKPHOS 51 10/24/2012 0505   BILITOT 0.2* 10/24/2012 0505   GFRNONAA >90 10/24/2012 0505   GFRAA >90 10/24/2012 0505   Lipase  No results found for this basename: lipase       Studies/Results: Ct Chest W  Contrast  10/25/2012   *RADIOLOGY REPORT*  Clinical Data:  Rectal bleeding, rectal mass at colonoscopy 10/24/2012  CT CHEST, ABDOMEN AND PELVIS WITH CONTRAST  Technique:  Multidetector CT imaging of the chest, abdomen and pelvis was performed following the standard protocol during bolus administration of intravenous contrast.  Contrast: OMNIPAQUE IOHEXOL 300 MG/ML  SOLN  Comparison:  CT abdomen/pelvis without contrast 09/10/2010  CT CHEST  Findings:  There is a lobulated irregular mass in the superior segment right lower lobe measuring 5.0 x 4.6 cm image 41.  Rim calcified right upper lobe 0.8 cm nodule which is centrally lucent is noted on image 25.  2 and 3 mm left intrapulmonary lymph node abutting the major fissure are noted images 29 and 31.  Central airways are patent.  Heart size is normal.  No lymphadenopathy.  No pericardial or pleural effusion.  There is mild osteopenia in the thoracic spine with multilevel disc degenerative change but no focal lytic or sclerotic osseous lesion is identified.  IMPRESSION: Irregular right lower lobe superior  segment pulmonary mass which primary considerations include primary bronchogenic carcinoma or less likely solitary metastasis.  CT ABDOMEN AND PELVIS  Findings:  There is mass-like rectosigmoid colonic wall thickening measuring 2.9 cm in thickness image 102.  Adjacent pericolonic stranding and abnormal lymphadenopathy is noted, with dominant right pericolonic lymph node measuring 0.8 cm image 102.  The mass- like thickening immediately abuts the bladder but no obvious intravesical invasion is identified allowing for technique.  4 mm short axis lymph node image 85 at the iliac artery bifurcation is nonspecific but new since the prior exam as well and suspicious. Uterus and ovaries are not identified, compatible with prior total hysterectomy per report.  Small bowel and appendix are normal.  Liver, kidneys, adrenal glands, spleen, gallbladder, and pancreas are  normal.  Degenerative changes are noted in the spine with areas of trabecular rarefaction but no focal identifiable osseous lesion.  IMPRESSION: Mass-like rectosigmoid wall thickening, surrounding stranding, and adjacent lymphadenopathy, compatible with malignancy with local nodal spread.  Inflammation and local reactive lymph nodes would be much less likely.  PET CT is recommended for global staging purposes and for further evaluation of the above described lung mass, as well as surgical consultation.  These results will be called to the ordering clinician or representative by the Radiologist Assistant, and communication documented in the PACS Dashboard.   Original Report Authenticated By: Christiana Pellant, M.D.   Ct Abdomen Pelvis W Contrast  10/25/2012   *RADIOLOGY REPORT*  Clinical Data:  Rectal bleeding, rectal mass at colonoscopy 10/24/2012  CT CHEST, ABDOMEN AND PELVIS WITH CONTRAST  Technique:  Multidetector CT imaging of the chest, abdomen and pelvis was performed following the standard protocol during bolus administration of intravenous contrast.  Contrast: OMNIPAQUE IOHEXOL 300 MG/ML  SOLN  Comparison:  CT abdomen/pelvis without contrast 09/10/2010  CT CHEST  Findings:  There is a lobulated irregular mass in the superior segment right lower lobe measuring 5.0 x 4.6 cm image 41.  Rim calcified right upper lobe 0.8 cm nodule which is centrally lucent is noted on image 25.  2 and 3 mm left intrapulmonary lymph node abutting the major fissure are noted images 29 and 31.  Central airways are patent.  Heart size is normal.  No lymphadenopathy.  No pericardial or pleural effusion.  There is mild osteopenia in the thoracic spine with multilevel disc degenerative change but no focal lytic or sclerotic osseous lesion is identified.  IMPRESSION: Irregular right lower lobe superior segment pulmonary mass which primary considerations include primary bronchogenic carcinoma or less likely solitary metastasis.  CT  ABDOMEN AND PELVIS  Findings:  There is mass-like rectosigmoid colonic wall thickening measuring 2.9 cm in thickness image 102.  Adjacent pericolonic stranding and abnormal lymphadenopathy is noted, with dominant right pericolonic lymph node measuring 0.8 cm image 102.  The mass- like thickening immediately abuts the bladder but no obvious intravesical invasion is identified allowing for technique.  4 mm short axis lymph node image 85 at the iliac artery bifurcation is nonspecific but new since the prior exam as well and suspicious. Uterus and ovaries are not identified, compatible with prior total hysterectomy per report.  Small bowel and appendix are normal.  Liver, kidneys, adrenal glands, spleen, gallbladder, and pancreas are normal.  Degenerative changes are noted in the spine with areas of trabecular rarefaction but no focal identifiable osseous lesion.  IMPRESSION: Mass-like rectosigmoid wall thickening, surrounding stranding, and adjacent lymphadenopathy, compatible with malignancy with local nodal spread.  Inflammation and local  reactive lymph nodes would be much less likely.  PET CT is recommended for global staging purposes and for further evaluation of the above described lung mass, as well as surgical consultation.  These results will be called to the ordering clinician or representative by the Radiologist Assistant, and communication documented in the PACS Dashboard.   Original Report Authenticated By: Christiana Pellant, M.D.    Anti-infectives: Anti-infectives   None       Assessment/Plan  1. 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 Pulmonary mass, either secondary primary vs single metastatic lesion 3. GI bleed secondary to #1  Plan: 1. I have had a long d/w the patient regarding her CT scan findings.  She of course, is floored and having a difficult time processing all of this information. 2. I have spoken also to Dr. Jerral Ralph who is going to start arranging  further testing etc. 3. Await tissue dx from colonoscopy 4. She will need a biopsy of her lung mass to determine if this is a primary vs met (IR vs pulm vs CT surg, defer to primary) 5. She will need oncologic evaluation, either inpatient vs outpatient.  Would favor inpatient once tissue dx established 6. She will need an EUS.  Dr. Christella Hartigan does these within the [facility]. 7. She will also need a PET scan at some point as well, but this will have to be done as an outpatient. 8. Dr. Maisie Fus, our colorectal specialist, is happy to see her as an outpatient with close follow up as well.  I suspect first line treatment will be neoadjuvant therapy (chemo, etc)  Defer to oncology as to whether the patient will need a port or not. 9. The patient initially expressed interest in a second opinion at a tertiary facility; however, given this new information, she was much less sure about pursuing that today.  10. Will give full liquids today and make sure she can tolerate those.  LOS: 3 days    Jahniya Duzan E 10/26/2012, 8:30 AM Pager: [phone]

## 2012-10-26 NOTE — Telephone Encounter (Signed)
Disc situation with Anne [name] and  Patient  Herself.  Since she doesn't yet have my chart access.  She requests copies of her cpx labs   And hg a1c etc Done earlier this month .  Fax to  [phone]  Confidential  Attn  Cleotis Nipper  Also please put her in   For appt  With me  AUGUST 6th  At 4 pm   (next Wednesday) .

## 2012-10-26 NOTE — Consult Note (Signed)
PULMONARY  / CRITICAL CARE MEDICINE  Name: Anne [name] MRN: [id number] DOB: 05-17-1945    ADMISSION DATE:  10/23/2012 CONSULTATION DATE:  10/26/2012  REFERRING MD : Jeoffrey Massed  CHIEF COMPLAINT: Lung mass  BRIEF PATIENT DESCRIPTION:  67 yo never smoker female admitted with rectal bleeding 2nd to rectal mass.  Found to have 5 x 4.6 cm RLL mass on CT chest, and PCCM consulted to assess.  SIGNIFICANT EVENTS: 7/27 Admit with rectal bleeding, GI consult 7/28 Attempted colonoscopy >> poor bowel prep 7/29 Colonoscopy, Mass in rectum, surgery consult  STUDIES:  7/29 Colonoscopy >> mass in rectum 7/29 CT chest >> mass superior segment RLL 5 x 4.6 cm, 0.8 cm nodule RUL, 0.2 and 0.3 cm nodules Lt major fissure 7/29 CT abd/pelvis >> mass in rectosigmoid colon abuts bladder, 0.8 cm Rt pericolonic LAN  LINES / TUBES: Peripheral IV's  CULTURES: Urine 7/28 >> negative  HISTORY OF PRESENT ILLNESS:   67 yo female in good health presented with rectal bleeding.  She was found to have rectal mass with concern for cancer.  During staging w/u was found to have right lower lung mass.    She has prior history of pneumonia and rib fracture several decades ago.  She has not undergone recent chest xray for comparison.  She denies cough, wheeze, sputum, hemoptysis, dyspnea, chest pain, difficulty swallowing, hoarseness, or change in vision.    She reports intolerance to versed with previous procedures.  She has history of pre-diabetes, and has lost considerable weight over the past year.  She denies fever or sweats.  No hx of exposure to tuberculosis.  No hx of smoking or occupational exposure.  PAST MEDICAL HISTORY :  Past Medical History  Diagnosis Date  . Heart murmur, systolic 07/01/2011  . Postmenopausal HRT (hormone replacement therapy) 07/01/2011    surgical menopause  . Hx of hyperglycemia 07/01/2011  . Hx of compression fracture of spine 1985    t12 after a  20 foot fall  . Hx of  fracture of wrist     after fall   . 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 Hx of colonic polyps   . Early cataract     stoneburner   . High frequency hearing loss     [facility] followed   . Diabetes mellitus without complication   . Cancer   . Skin cancer    Past Surgical History  Procedure Laterality Date  . Tonsillectomy  1952  . Adenoidectomy  1952  . Wrist surgery  1957    left reduction  . Compression fraction  1985    T12  . Anterior cruciate ligament repair  1993    right Murphy  . Cosmetic surgery  1999-2007    holderness blepharoplasty facial rhytidectomy mastopexy abdominoplasty   . Dental implant    . Dilation and curettage of uterus  1977  . Total abdominal hysterectomy  2005    with cystocele repair   . Colonoscopy N/A 10/24/2012    Procedure: COLONOSCOPY;  Surgeon: Hilarie Fredrickson, MD;  Location: [facility] ENDOSCOPY;  Service: Endoscopy;  Laterality: N/A;  . Colonoscopy N/A 10/25/2012    Procedure: COLONOSCOPY;  Surgeon: Hilarie Fredrickson, MD;  Location: [facility] ENDOSCOPY;  Service: Endoscopy;  Laterality: N/A;   Prior to Admission medications   Medication Sig Start Date End Date Taking? Authorizing Provider  acetaminophen (TYLENOL) 325 MG tablet Take 650 mg by mouth every 6 (six) hours as needed  for pain.   Yes Historical Provider, MD  Calcium Carbonate Antacid (TUMS E-X PO) Take 2 tablets by mouth daily.   Yes Historical Provider, MD  Calcium Carbonate-Vitamin D (CALTRATE 600+D PO) Take 1 tablet by mouth daily.    Yes Historical Provider, MD  carbamide peroxide (DEBROX) 6.5 % otic solution Place 5 drops into both ears 2 (two) times daily.   Yes Historical Provider, MD  estradiol (MINIVELLE) 0.0375 MG/24HR Place 1 patch onto the skin 2 (two) times a week. Sundays and Thursdays   Yes Historical Provider, MD  metFORMIN (GLUCOPHAGE) 1000 MG tablet Take 1,000 mg by mouth 2 (two) times daily with a meal.   Yes Historical Provider, MD  METHYLTESTOSTERONE PO Take 1.25 mg by mouth daily.    Yes Historical Provider, MD  Multiple Vitamin (MULTIVITAMIN) tablet Take 1 tablet by mouth daily. Senior   Yes Historical Provider, MD  Probiotic Product (SOLUBLE FIBER/PROBIOTICS PO) Take 1 tablet by mouth daily.   Yes Historical Provider, MD  simethicone (MYLICON) 125 MG chewable tablet Chew 125 mg by mouth every 6 (six) hours as needed for flatulence.   Yes Historical Provider, MD  sitaGLIPtin (JANUVIA) 100 MG tablet Take 100 mg by mouth daily.   Yes Historical Provider, MD   Allergies  Allergen Reactions  . Midazolam Other (See Comments)    Caused very crazy feelings, terrors, panic attacks, anxiety  . Zolpidem Tartrate Other (See Comments)    Caused very crazy feelings, terrors, shaking, panic attacks, anxiety  . Neomycin-Bacitracin Zn-Polymyx Itching and Rash    FAMILY HISTORY:  Family History  Problem Relation Age of Onset  . Diabetes Father   . Cancer Father     oral  . Thyroid disease Father   . Heart attack Father     died age 3  . Dementia Mother   . Hypertension Mother   . Hyperlipidemia Mother   . Cancer Brother     retro lymphosarcoma agent orange  . Cancer Sister     clear cell ovarina and breast insitu  . Obesity      siblings   . Diabetes Brother   . Alcohol abuse Brother     hx of  . Other      arrythmia   . Hypertension Sister   . Asthma Sister    SOCIAL HISTORY:  reports that she has never smoked. She has never used smokeless tobacco. She reports that she drinks about 1.0 ounces of alcohol per week. She reports that she does not use illicit drugs.  REVIEW OF SYSTEMS:   Negative except above  SUBJECTIVE:   VITAL SIGNS: Temp:  [97.8 F (36.6 C)-98.3 F (36.8 C)] 98.2 F (36.8 C) (07/30 0554) Pulse Rate:  [78-88] 80 (07/30 0554) Resp:  [9-84] 18 (07/30 0554) BP: (135-167)/(47-90) 167/79 mmHg (07/30 0554) SpO2:  [98 %-100 %] 98 % (07/30 0554) Room air  PHYSICAL EXAMINATION: General: No distress Neuro: CN intact, normal strength HEENT:  MP 3, no oral exudate, no LAN Cardiovascular:  Regular, no murmur Lungs:  Good air entry, no wheeze/rales Abdomen:  Soft, non tender, + bowel sounds Musculoskeletal:  No edema Skin:  No rashes   Recent Labs Lab 10/23/12 1637 10/24/12 0505  NA 138 134*  K 3.6 4.0  CL 98 98  CO2 25 27  BUN 15 13  CREATININE 0.56 0.54  GLUCOSE 133* 124*    Recent Labs Lab 10/23/12 1637  10/24/12 0505  10/25/12 1032 10/25/12 1937 10/26/12 0242  HGB 11.3*  < > 9.5*  < > 8.6* 10.0* 9.1*  HCT 33.3*  < > 28.3*  < > 26.3* 30.1* 26.9*  WBC 8.7  --  8.3  --   --   --   --   PLT 353  --  332  --   --   --   --   < > = values in this interval not displayed. Ct Chest W Contrast  10/25/2012   *RADIOLOGY REPORT*  Clinical Data:  Rectal bleeding, rectal mass at colonoscopy 10/24/2012  CT CHEST, ABDOMEN AND PELVIS WITH CONTRAST  Technique:  Multidetector CT imaging of the chest, abdomen and pelvis was performed following the standard protocol during bolus administration of intravenous contrast.  Contrast: OMNIPAQUE IOHEXOL 300 MG/ML  SOLN  Comparison:  CT abdomen/pelvis without contrast 09/10/2010  CT CHEST  Findings:  There is a lobulated irregular mass in the superior segment right lower lobe measuring 5.0 x 4.6 cm image 41.  Rim calcified right upper lobe 0.8 cm nodule which is centrally lucent is noted on image 25.  2 and 3 mm left intrapulmonary lymph node abutting the major fissure are noted images 29 and 31.  Central airways are patent.  Heart size is normal.  No lymphadenopathy.  No pericardial or pleural effusion.  There is mild osteopenia in the thoracic spine with multilevel disc degenerative change but no focal lytic or sclerotic osseous lesion is identified.  IMPRESSION: Irregular right lower lobe superior segment pulmonary mass which primary considerations include primary bronchogenic carcinoma or less likely solitary metastasis.  CT ABDOMEN AND PELVIS  Findings:  There is mass-like rectosigmoid  colonic wall thickening measuring 2.9 cm in thickness image 102.  Adjacent pericolonic stranding and abnormal lymphadenopathy is noted, with dominant right pericolonic lymph node measuring 0.8 cm image 102.  The mass- like thickening immediately abuts the bladder but no obvious intravesical invasion is identified allowing for technique.  4 mm short axis lymph node image 85 at the iliac artery bifurcation is nonspecific but new since the prior exam as well and suspicious. Uterus and ovaries are not identified, compatible with prior total hysterectomy per report.  Small bowel and appendix are normal.  Liver, kidneys, adrenal glands, spleen, gallbladder, and pancreas are normal.  Degenerative changes are noted in the spine with areas of trabecular rarefaction but no focal identifiable osseous lesion.  IMPRESSION: Mass-like rectosigmoid wall thickening, surrounding stranding, and adjacent lymphadenopathy, compatible with malignancy with local nodal spread.  Inflammation and local reactive lymph nodes would be much less likely.  PET CT is recommended for global staging purposes and for further evaluation of the above described lung mass, as well as surgical consultation.  These results will be called to the ordering clinician or representative by the Radiologist Assistant, and communication documented in the PACS Dashboard.   Original Report Authenticated By: Christiana Pellant, M.D.   Ct Abdomen Pelvis W Contrast  10/25/2012   *RADIOLOGY REPORT*  Clinical Data:  Rectal bleeding, rectal mass at colonoscopy 10/24/2012  CT CHEST, ABDOMEN AND PELVIS WITH CONTRAST  Technique:  Multidetector CT imaging of the chest, abdomen and pelvis was performed following the standard protocol during bolus administration of intravenous contrast.  Contrast: OMNIPAQUE IOHEXOL 300 MG/ML  SOLN  Comparison:  CT abdomen/pelvis without contrast 09/10/2010  CT CHEST  Findings:  There is a lobulated irregular mass in the superior segment right  lower lobe measuring 5.0 x 4.6 cm image 41.  Rim calcified right upper  lobe 0.8 cm nodule which is centrally lucent is noted on image 25.  2 and 3 mm left intrapulmonary lymph node abutting the major fissure are noted images 29 and 31.  Central airways are patent.  Heart size is normal.  No lymphadenopathy.  No pericardial or pleural effusion.  There is mild osteopenia in the thoracic spine with multilevel disc degenerative change but no focal lytic or sclerotic osseous lesion is identified.  IMPRESSION: Irregular right lower lobe superior segment pulmonary mass which primary considerations include primary bronchogenic carcinoma or less likely solitary metastasis.  CT ABDOMEN AND PELVIS  Findings:  There is mass-like rectosigmoid colonic wall thickening measuring 2.9 cm in thickness image 102.  Adjacent pericolonic stranding and abnormal lymphadenopathy is noted, with dominant right pericolonic lymph node measuring 0.8 cm image 102.  The mass- like thickening immediately abuts the bladder but no obvious intravesical invasion is identified allowing for technique.  4 mm short axis lymph node image 85 at the iliac artery bifurcation is nonspecific but new since the prior exam as well and suspicious. Uterus and ovaries are not identified, compatible with prior total hysterectomy per report.  Small bowel and appendix are normal.  Liver, kidneys, adrenal glands, spleen, gallbladder, and pancreas are normal.  Degenerative changes are noted in the spine with areas of trabecular rarefaction but no focal identifiable osseous lesion.  IMPRESSION: Mass-like rectosigmoid wall thickening, surrounding stranding, and adjacent lymphadenopathy, compatible with malignancy with local nodal spread.  Inflammation and local reactive lymph nodes would be much less likely.  PET CT is recommended for global staging purposes and for further evaluation of the above described lung mass, as well as surgical consultation.  These results will be  called to the ordering clinician or representative by the Radiologist Assistant, and communication documented in the PACS Dashboard.   Original Report Authenticated By: Christiana Pellant, M.D.    ASSESSMENT / PLAN:  Incidental finding of right lower lung mass >> more concerning for second primary malignancy rather than metastatic lesion from rectal mass. P: -she is scheduled for bronchoscopy with Dr. Molli Knock for 2 pm on 7/31 -procedure explained to pt.  Risks detailed as bleeding, infection, pneumothorax, and non diagnosis -await results of rectal mass biopsy -will need further staging with MRI brain and PET scan >> can be done as outpt  Updated family at bedside about plan.  Pulmonary and Critical Care Medicine [facility] Pager: [phone]  10/26/2012, 10:44 AM

## 2012-10-26 NOTE — Progress Notes (Signed)
Noted plan by pulmonary for bronch/BX tomorrow.  I contacted Dr. Maisie Fus from our practice who is a colorectal specialist and she will plan to see her at D/C. We spoke at length regarding the ongoing W/U and plan of care. Patient examined and I agree with the assessment and plan  Violeta Gelinas, MD, MPH, FACS Pager: [phone]  10/26/2012 1:09 PM

## 2012-10-27 ENCOUNTER — Inpatient Hospital Stay (HOSPITAL_COMMUNITY): Payer: 59

## 2012-10-27 ENCOUNTER — Encounter (HOSPITAL_COMMUNITY)
Admission: EM | Disposition: A | Discharge status: Home / Self Care | Payer: Self-pay | Source: Home / Self Care | Attending: Internal Medicine

## 2012-10-27 ENCOUNTER — Encounter (HOSPITAL_COMMUNITY): Payer: 59

## 2012-10-27 DIAGNOSIS — R079 Chest pain, unspecified: Secondary | ICD-10-CM

## 2012-10-27 HISTORY — PX: VIDEO BRONCHOSCOPY: SHX5072

## 2012-10-27 LAB — BASIC METABOLIC PANEL
BUN: 8 mg/dL (ref 6–23)
CO2: 27 mEq/L (ref 19–32)
Chloride: 103 mEq/L (ref 96–112)
Glucose, Bld: 145 mg/dL — ABNORMAL HIGH (ref 70–99)
Potassium: 4 mEq/L (ref 3.5–5.1)
Sodium: 140 mEq/L (ref 135–145)

## 2012-10-27 LAB — CBC
HCT: 30.1 % — ABNORMAL LOW (ref 36.0–46.0)
Hemoglobin: 10.1 g/dL — ABNORMAL LOW (ref 12.0–15.0)
RBC: 3.79 MIL/uL — ABNORMAL LOW (ref 3.87–5.11)

## 2012-10-27 LAB — GLUCOSE, CAPILLARY
Glucose-Capillary: 147 mg/dL — ABNORMAL HIGH (ref 70–99)
Glucose-Capillary: 156 mg/dL — ABNORMAL HIGH (ref 70–99)

## 2012-10-27 SURGERY — BRONCHOSCOPY, WITH FLUOROSCOPY
Anesthesia: Moderate Sedation | Laterality: Bilateral

## 2012-10-27 MED ORDER — FENTANYL CITRATE 0.05 MG/ML IJ SOLN
INTRAMUSCULAR | Status: DC | PRN
  Administered 2012-10-27 (×3): 50 ug via INTRAVENOUS

## 2012-10-27 MED ORDER — LIDOCAINE HCL 1 % IJ SOLN
INTRAMUSCULAR | Status: DC | PRN
  Administered 2012-10-27: 6 mL via RESPIRATORY_TRACT

## 2012-10-27 MED ORDER — DIAZEPAM 5 MG/ML IJ SOLN
INTRAMUSCULAR | Status: DC | PRN
  Administered 2012-10-27: 2.5 mg via INTRAVENOUS
  Administered 2012-10-27 (×2): 1.25 mg via INTRAVENOUS
  Administered 2012-10-27: 2.5 mg via INTRAVENOUS

## 2012-10-27 NOTE — Care Management Note (Unsigned)
    Page 1 of 1   10/28/2012     12:01:02 PM   CARE MANAGEMENT NOTE 10/28/2012  Patient:  Anne [name], Anne [name]   Account Number:  [id number]  Date Initiated:  10/27/2012  Documentation initiated by:  Letha Cape  Subjective/Objective Assessment:   dx recal ca, lung mets  admit- from home with family.  pta indep. pt is a Administrator, arts.     Action/Plan:   Anticipated DC Date:  10/28/2012   Anticipated DC Plan:  HOME/SELF CARE      DC Planning Services  CM consult      Choice offered to / List presented to:             Status of service:  Completed, signed off Medicare Important Message given?   (If response is "NO", the following Medicare IM given date fields will be blank) Date Medicare IM given:   Date Additional Medicare IM given:    Discharge Disposition:  HOME/SELF CARE  Per UR Regulation:  Reviewed for med. necessity/level of care/duration of stay  If discussed at Long Length of Stay Meetings, dates discussed:    Comments:  10/28/12 11:49 Letha Cape RN, BSN [phone] patient inquired about adult day care centers for her mother,  I gave her information regarding [facility] program and [facility]), patient will follow up on that information.  10/27/12 16:52 Letha Cape RN, BSN [phone] patient lives with family, pta indep.  NCM will continue to follow for dc needs.

## 2012-10-27 NOTE — Progress Notes (Signed)
PATIENT DETAILS Name: Anne [name] Age: 67 y.o. Sex: female Date of Birth: 11/29/1945 Admit Date: 10/23/2012 Admitting Physician Jonah Blue, DO ZOX:[id number] Clayborne Dana, MD  Subjective: No major issues overnight, awaiting Bronchoscopy today  Assessment/Plan: Principal Problem:   Rectal bleeding - Do not suspect diverticular etiology, likely secondary to rectal cancer - Hemoglobin and hematocrit remained stable - Colonoscopy done on 7/29-bulky, circumferential, friable mass lesion in the rectum at approximately 4 cm above the dentate line. Biopsies are pending. - CT of the abdomen-shows local lymphadenopathy. - CT of the chest-shows a large solitary right lung mass- see below -Oncology consulted  Active Problems:  Right lung mass - Not sure whether this is a solitary metastatic lesion or a second primary tumor - PCCM consulted-a bronchoscopy today  Acute blood loss anemia - From rectal bleeding - Fortunately that the bleeding has subsided, hemoglobin and hematocrit remained stable-no need for transfusion so far.  Diabetes - CBGs stable - Continue SSI - Hold on metformin angina LDL while inpatient  Disposition: Remain inpatient  DVT Prophylaxis:  SCD's  Code Status: Full code   Family Communication None  Procedures:  None  CONSULTS:  pulmonary/intensive care and GI   MEDICATIONS: Scheduled Meds: . [MAR HOLD] bisacodyl  5 mg Oral Once  . [MAR HOLD] estradiol  1 patch Transdermal Custom  . [MAR HOLD] insulin aspart  0-9 Units Subcutaneous Q6H  . [facility] HOLD] MethylTESTOSTERone  1.25 mg Oral Daily  . [MAR HOLD] multivitamin with minerals  1 tablet Oral Daily  . [facility] HOLD] sodium chloride  3 mL Intravenous Q12H   Continuous Infusions:   PRN Meds:.[facility] HOLD] acetaminophen, lidocaine, [MAR HOLD]  morphine injection, [MAR HOLD] ondansetron  Antibiotics: Anti-infectives   None       PHYSICAL EXAM: Vital signs in last 24 hours: Filed Vitals:    10/27/12 1045 10/27/12 1050 10/27/12 1055 10/27/12 1100  BP: 168/89 160/86 174/92 176/89  Pulse:      Temp:      TempSrc:      Resp: 15 14 16 13   Height:      Weight:      SpO2: 98% 98% 98% 98%    Weight change:  Filed Weights   10/23/12 2223 10/25/12 0635 10/27/12 1004  Weight: 65.817 kg (145 lb 1.6 oz) 65.318 kg (144 lb) 65.318 kg (144 lb)   Body mass index is 24.11 kg/(m^2).   Gen Exam: Awake and alert with clear speech.  Neck: Supple, No JVD.   Chest: B/L Clear.   CVS: S1 S2 Regular, no murmurs.  Abdomen: soft, BS +, non tender, non distended. Extremities: no edema, lower extremities warm to touch. Neurologic: Non Focal.   Skin: No Rash.   Wounds: N/A.    Intake/Output from previous day:  Intake/Output Summary (Last 24 hours) at 10/27/12 1107 Last data filed at 10/27/12 0900  Gross per 24 hour  Intake    120 ml  Output      0 ml  Net    120 ml     LAB RESULTS: CBC  Recent Labs Lab 10/23/12 1637  10/24/12 0505  10/25/12 1032 10/25/12 1937 10/26/12 0242 10/26/12 1154 10/27/12 0605  WBC 8.7  --  8.3  --   --   --   --   --  6.4  HGB 11.3*  < > 9.5*  < > 8.6* 10.0* 9.1* 9.8* 10.1*  HCT 33.3*  < > 28.3*  < > 26.3* 30.1* 26.9* 28.9* 30.1*  PLT 353  --  332  --   --   --   --   --  345  MCV 79.1  --  79.1  --   --   --   --   --  79.4  MCH 26.8  --  26.5  --   --   --   --   --  26.6  MCHC 33.9  --  33.6  --   --   --   --   --  33.6  RDW 14.9  --  14.8  --   --   --   --   --  15.3  LYMPHSABS 2.3  --   --   --   --   --   --   --   --   MONOABS 0.5  --   --   --   --   --   --   --   --   EOSABS 0.3  --   --   --   --   --   --   --   --   BASOSABS 0.1  --   --   --   --   --   --   --   --   < > = values in this interval not displayed.  Chemistries   Recent Labs Lab 10/23/12 1637 10/24/12 0505 10/27/12 0605  NA 138 134* 140  K 3.6 4.0 4.0  CL 98 98 103  CO2 25 27 27   GLUCOSE 133* 124* 145*  BUN 15 13 8   CREATININE 0.56 0.54 0.66  CALCIUM  10.4 9.5 10.0    CBG:  Recent Labs Lab 10/26/12 0552 10/26/12 1217 10/26/12 1708 10/27/12 10/27/12 0620  GLUCAP 105* 262* 140* 159* 147*    GFR Estimated Creatinine Clearance: 60 ml/min (by C-G formula based on Cr of 0.66).  Coagulation profile  Recent Labs Lab 10/23/12 1637 10/24/12 0505  INR 1.02 1.04    Cardiac Enzymes No results found for this basename: CK, CKMB, TROPONINI, MYOGLOBIN,  in the last 168 hours  No components found with this basename: POCBNP,  No results found for this basename: DDIMER,  in the last 72 hours No results found for this basename: HGBA1C,  in the last 72 hours No results found for this basename: CHOL, HDL, LDLCALC, TRIG, CHOLHDL, LDLDIRECT,  in the last 72 hours No results found for this basename: TSH, T4TOTAL, FREET3, T3FREE, THYROIDAB,  in the last 72 hours No results found for this basename: VITAMINB12, FOLATE, FERRITIN, TIBC, IRON, RETICCTPCT,  in the last 72 hours No results found for this basename: LIPASE, AMYLASE,  in the last 72 hours  Urine Studies No results found for this basename: UACOL, UAPR, USPG, UPH, UTP, UGL, UKET, UBIL, UHGB, UNIT, UROB, ULEU, UEPI, UWBC, URBC, UBAC, CAST, CRYS, UCOM, BILUA,  in the last 72 hours  MICROBIOLOGY: Recent Results (from the past 240 hour(s))  URINE CULTURE     Status: None   Collection Time    10/24/12  2:24 AM      Result Value Range Status   Specimen Description URINE, CLEAN CATCH   Final   Special Requests ADDED 0255   Final   Culture  Setup Time 10/24/2012 03:22   Final   Colony Count NO GROWTH   Final   Culture NO GROWTH   Final   Report Status 10/25/2012 FINAL   Final    RADIOLOGY STUDIES/RESULTS: Ct  Chest W Contrast  10/25/2012   *RADIOLOGY REPORT*  Clinical Data:  Rectal bleeding, rectal mass at colonoscopy 10/24/2012  CT CHEST, ABDOMEN AND PELVIS WITH CONTRAST  Technique:  Multidetector CT imaging of the chest, abdomen and pelvis was performed following the standard protocol  during bolus administration of intravenous contrast.  Contrast: OMNIPAQUE IOHEXOL 300 MG/ML  SOLN  Comparison:  CT abdomen/pelvis without contrast 09/10/2010  CT CHEST  Findings:  There is a lobulated irregular mass in the superior segment right lower lobe measuring 5.0 x 4.6 cm image 41.  Rim calcified right upper lobe 0.8 cm nodule which is centrally lucent is noted on image 25.  2 and 3 mm left intrapulmonary lymph node abutting the major fissure are noted images 29 and 31.  Central airways are patent.  Heart size is normal.  No lymphadenopathy.  No pericardial or pleural effusion.  There is mild osteopenia in the thoracic spine with multilevel disc degenerative change but no focal lytic or sclerotic osseous lesion is identified.  IMPRESSION: Irregular right lower lobe superior segment pulmonary mass which primary considerations include primary bronchogenic carcinoma or less likely solitary metastasis.  CT ABDOMEN AND PELVIS  Findings:  There is mass-like rectosigmoid colonic wall thickening measuring 2.9 cm in thickness image 102.  Adjacent pericolonic stranding and abnormal lymphadenopathy is noted, with dominant right pericolonic lymph node measuring 0.8 cm image 102.  The mass- like thickening immediately abuts the bladder but no obvious intravesical invasion is identified allowing for technique.  4 mm short axis lymph node image 85 at the iliac artery bifurcation is nonspecific but new since the prior exam as well and suspicious. Uterus and ovaries are not identified, compatible with prior total hysterectomy per report.  Small bowel and appendix are normal.  Liver, kidneys, adrenal glands, spleen, gallbladder, and pancreas are normal.  Degenerative changes are noted in the spine with areas of trabecular rarefaction but no focal identifiable osseous lesion.  IMPRESSION: Mass-like rectosigmoid wall thickening, surrounding stranding, and adjacent lymphadenopathy, compatible with malignancy with local  nodal spread.  Inflammation and local reactive lymph nodes would be much less likely.  PET CT is recommended for global staging purposes and for further evaluation of the above described lung mass, as well as surgical consultation.  These results will be called to the ordering clinician or representative by the Radiologist Assistant, and communication documented in the PACS Dashboard.   Original Report Authenticated By: Christiana Pellant, M.D.   Ct Abdomen Pelvis W Contrast  10/25/2012   *RADIOLOGY REPORT*  Clinical Data:  Rectal bleeding, rectal mass at colonoscopy 10/24/2012  CT CHEST, ABDOMEN AND PELVIS WITH CONTRAST  Technique:  Multidetector CT imaging of the chest, abdomen and pelvis was performed following the standard protocol during bolus administration of intravenous contrast.  Contrast: OMNIPAQUE IOHEXOL 300 MG/ML  SOLN  Comparison:  CT abdomen/pelvis without contrast 09/10/2010  CT CHEST  Findings:  There is a lobulated irregular mass in the superior segment right lower lobe measuring 5.0 x 4.6 cm image 41.  Rim calcified right upper lobe 0.8 cm nodule which is centrally lucent is noted on image 25.  2 and 3 mm left intrapulmonary lymph node abutting the major fissure are noted images 29 and 31.  Central airways are patent.  Heart size is normal.  No lymphadenopathy.  No pericardial or pleural effusion.  There is mild osteopenia in the thoracic spine with multilevel disc degenerative change but no focal lytic or sclerotic osseous lesion is  identified.  IMPRESSION: Irregular right lower lobe superior segment pulmonary mass which primary considerations include primary bronchogenic carcinoma or less likely solitary metastasis.  CT ABDOMEN AND PELVIS  Findings:  There is mass-like rectosigmoid colonic wall thickening measuring 2.9 cm in thickness image 102.  Adjacent pericolonic stranding and abnormal lymphadenopathy is noted, with dominant right pericolonic lymph node measuring 0.8 cm image 102.  The  mass- like thickening immediately abuts the bladder but no obvious intravesical invasion is identified allowing for technique.  4 mm short axis lymph node image 85 at the iliac artery bifurcation is nonspecific but new since the prior exam as well and suspicious. Uterus and ovaries are not identified, compatible with prior total hysterectomy per report.  Small bowel and appendix are normal.  Liver, kidneys, adrenal glands, spleen, gallbladder, and pancreas are normal.  Degenerative changes are noted in the spine with areas of trabecular rarefaction but no focal identifiable osseous lesion.  IMPRESSION: Mass-like rectosigmoid wall thickening, surrounding stranding, and adjacent lymphadenopathy, compatible with malignancy with local nodal spread.  Inflammation and local reactive lymph nodes would be much less likely.  PET CT is recommended for global staging purposes and for further evaluation of the above described lung mass, as well as surgical consultation.  These results will be called to the ordering clinician or representative by the Radiologist Assistant, and communication documented in the PACS Dashboard.   Original Report Authenticated By: Christiana Pellant, M.D.    Jeoffrey Massed, MD  [facility] Pager:[phone]  If 7PM-7AM, please contact night-coverage [email or web address] Password [email or web address] 10/27/2012, 11:07 AM   LOS: 4 days

## 2012-10-27 NOTE — Telephone Encounter (Signed)
Patient placed on the schedule.  Fax sent and confirmation received.

## 2012-10-27 NOTE — Procedures (Signed)
Bronchoscopy Procedure Note Anne [name] [id number] [birth year]  Procedure: Bronchoscopy, BAL, brushing, and transbronchial biopsy Rt lower lobe Indications: Hx of rectal mass and incidental finding of right lower lobe lung mass  Procedure Details Consent: Risks of procedure as well as the alternatives and risks of each were explained to the patient.  Consent for procedure obtained. Time Out: Verified patient identification, verified procedure, site/side was marked, verified correct patient position, special equipment/implants available, medications/allergies/relevent history reviewed, required imaging and test results available.  Performed  67 yo female admitted with rectal bleeding, and found to have rectal mass.  Staging work up showed mass in right lower lung anterior segment.  Concern is for second primary lung cancer versus metastatic lesion.  She is scheduled for bronchoscopy for further tissue sampling.  Procedure assisted by Dr. Koren Bound.  She was brought to endoscopy suite.  She was given cetacaine for topical anesthesia of posterior pharynx.  She was placed on 4 liters supplemental oxygen for procedure.  She was given total 7.5 mg valium and 150 mcg fentanyl for sedation and analgesia.    Bronchoscope was entered orally.  Vocal cords visualized with normal motion.  Total of 20 ml of 1% lidocaine instilled for topical anesthesia of airways.    Bronchoscope entered into trachea and carina visualized.  Bronchoscope entered into left main bronchus.  Lt upper, lingular, and lower lobe orifices visualized w/o evidence for endobronchial lesion, and mucosa had normal appearance.  Bronchoscope then entered into right main bronchus.  Right upper and middle lobe orifices visualized w/o endobronchial lesion and normal appearing mucosa.  Focused then shifted to right lower lobe.  Upon initial inspection of lower lobe orifices there was no obvious lesions.  Bronchoscope wedged into anterior  segment of right lower lobe for BAL.  Upon instilling 60 ml of saline for BAL, this segmental airway opened more and was able to visualize pink to yellow appearing mass lesion.  Then using fluoroscopic guidance transbronchial biopsies were obtained x 3 from anterior segment right lower lobe.  Then brushing was obtained from this segment for cytology.    There was minimal bleeding which resolved spontaneously.  No other immediate complications.  Hemodynamics, and oxygenation stable during procedure.  Bronchoscope withdrawn, and patient transferred to recovery in stable condition.  Plan Post-procedure chest xray pending Will send BAL for cytology and gram stain with culture Will send brushing for cytology Will send transbronchial biopsies for surgical patholology Will notify patient when results are available  Coralyn Helling, MD [facility] Pulmonary/Critical Care 10/27/2012, 11:57 AM Pager:  [phone] After 3pm call: [phone]   Coralyn Helling, MD [place] Pulmonary/Critical Care 10/27/2012, 11:48 AM Pager:  [phone] After 3pm call: [phone]

## 2012-10-27 NOTE — Plan of Care (Addendum)
Problem: Food- and Nutrition-Related Knowledge Deficit (NB-1.1) Goal: Nutrition education Formal process to instruct or train a patient/client in a skill or to impart knowledge to help patients/clients voluntarily manage or modify food choices and eating behavior to maintain or improve health. Outcome: Completed/Met Date Met:  10/27/12  RD consulted for nutrition education regarding "pre-diabetes".    Lab Results  Component Value Date    HGBA1C 7.5* 10/17/2012    RD provided "Carbohydrate Counting for People with Diabetes" and "Low Fiber Nutrition Therapy" handouts from the [employer] per patient request.  Patient already monitors her carbohydrate servings and drinks mostly water/sugar-free drinks.  She was also interested in diet guidelines for bowel "distress" (she mentioned diverticulitis).  Patient also requested name of outpatient RD at [facility].    Expect good compliance.  Body mass index is 24.11 kg/(m^2). Pt meets criteria for Normal based on current BMI.  Current diet order is Carbohydrate Modified Medium Calorie.  Labs and medications reviewed.   No further nutrition interventions warranted at this time.  If additional nutrition issues arise, please re-consult RD.  Maureen Chatters, RD, LDN Pager #: [phone] After-Hours Pager #: [phone]

## 2012-10-27 NOTE — Consult Note (Signed)
[facility]  [phone]   HEMATOLOGY ONCOLOGY CONSULTATION   DIVYA MUNSHI  DOB: 14-Nov-1945  MR#: [id number]  CSN#: [id number]    Requesting Physician: [facility]  Primary MD: Panosh  History of present illness: Colorectal  Carcinoma     [name]   67 y.o.  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 Of note,colonoscopy prior to this admission had been  performed on 03/04/10(screening)  which showed  three polyps ,largest 8 mm, removed, as well as moderate diverticulosis in the sigmoid colon, otherwise normal exam . On presentation, she had  Positive Guaiac and a rectal mass had been palpated on exam.  On 7/29 she underwent colonoscopy (Dr. Marina Goodell)  which revealed a bulky, circumferential, friable mass lesion in the rectum of about 4 cm above the dentate line, subtotally obstructing and would not permit passage of the colonoscope proximally. Multiple biopsies were taken. Pathology report pending.    CT of the chest, abdomen and pelvis on 10/25/2012 demonstrated  a lobulated irregular mass in the superior segment right lower lobe measuring 5.0 x 4.6 cm image 41, a rim calcified right upper lobe 0.8 cm nodule, centrally lucent , a  2 and 3 mm left intrapulmonary lymph node abutting the major fissure ,no lymphadenopathy, pericardial or pleural effusion. No focal lytic or sclerotic osseous lesion is identified. In the abdomen,there is mass- like rectosigmoid colonic wall thickening measuring 2.9 cm with adjacent pericolonic stranding and abnormal lymphadenopathy, with dominant right pericolonic lymph node measuring 0.8 cm  The mass- like thickening immediately abuts the bladder but no obvious intravesical invasion is identified allowing for technique.  4 mm lymph node   at the iliac artery bifurcation is nonspecific but new since the prior exam in 2012 as well and suspicious. Small bowel,appendix,liver, kidneys, adrenal glands, spleen, gallbladder, and pancreas are normal. No focal identifiable osseous lesion. She had bronchoscopy on 10/27/2012 for evaluation of the RLL mass. Pathology is pending. No tumor markers available for review.     We were kindly requested to see the patient with recommendations.    Past medical history:      Past Medical History  Diagnosis Date  . Heart murmur, systolic 07/01/2011  . Postmenopausal HRT (hormone replacement therapy) 07/01/2011    surgical menopause  . Hx of hyperglycemia 07/01/2011  . Hx of compression fracture of spine 1985    t12 after a  20 foot fall  . Hx of fracture of wrist     after fall   . 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 Hx of colonic polyps   . Early cataract     stoneburner   . High frequency hearing loss     kraus followed   . Diabetes mellitus without complication   . Cancer   . Skin cancer     Past surgical history:      Past Surgical History  Procedure Laterality Date  . Tonsillectomy  1952  . Adenoidectomy  1952  . Wrist surgery  1957    left reduction  . Compression fraction  1985    T12  . Anterior cruciate ligament repair  1993    right Murphy  . Cosmetic surgery  1999-2007    holderness blepharoplasty facial rhytidectomy  mastopexy abdominoplasty   . Dental implant    . Dilation and curettage of uterus  1977  . Total abdominal hysterectomy  2005    with cystocele repair   . Colonoscopy N/A 10/24/2012    Procedure: COLONOSCOPY;  Surgeon: Hilarie Fredrickson, MD;  Location: [facility] ENDOSCOPY;  Service: Endoscopy;  Laterality: N/A;  . Colonoscopy N/A 10/25/2012    Procedure: COLONOSCOPY;  Surgeon: Hilarie Fredrickson, MD;  Location: [facility] ENDOSCOPY;  Service: Endoscopy;  Laterality: N/A;    Medications:  . [MAR HOLD] bisacodyl  5 mg Oral Once  . [MAR HOLD] estradiol  1 patch Transdermal  Custom  . [MAR HOLD] insulin aspart  0-9 Units Subcutaneous Q6H  . [facility] HOLD] MethylTESTOSTERone  1.25 mg Oral Daily  . [MAR HOLD] multivitamin with minerals  1 tablet Oral Daily  . [MAR HOLD] sodium chloride  3 mL Intravenous Q12H   AVW:[id number], morphine injection, ondansetron  Allergies:  Allergies  Allergen Reactions  . Midazolam Other (See Comments)    Caused very crazy feelings, terrors, panic attacks, anxiety  . Zolpidem Tartrate Other (See Comments)    Caused very crazy feelings, terrors, shaking, panic attacks, anxiety  . Neomycin-Bacitracin Zn-Polymyx Itching and Rash    Family history:     Family History  Problem Relation Age of Onset  . Diabetes Father   . Cancer Father     oral  . Thyroid disease Father   . Heart attack Father     died age 97  . Dementia Mother   . Hypertension Mother   . Hyperlipidemia Mother   . Cancer Brother     retro lymphosarcoma agent orange  . Cancer Sister     clear cell ovarina and breast insitu  . Obesity      siblings   . Diabetes Brother   . Alcohol abuse Brother     hx of  . Other      arrythmia   . Hypertension Sister   . Asthma Sister             Social History: reports that she has never smoked. She has never used smokeless tobacco. She reports that she drinks about 1.0 ounces of alcohol per week. She reports that she does not use illicit drugs. 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.  Divorced since 2006. 2 children in good health. Originally from [place].  Family history: Brother died of retroperitoneal lymphosarcoma at age 74 after returning from [place] at age 70; sister had breast cancer and ovarian cancer in her early 64's. Father died of an MI at 58, he had diabetes, thyroid disease and oral cancer. Mother is 42 and lives with her; she has dementia and hyperlipidemia.    Review of systems:  See HPI  for significant positives.  Constitutional: She has lost 60 lbs in the last 4 years, intentional .No changes in appetite. Negative for fever,night sweats, or chills  Eyes: Negative for blurred vision and double vision.  Respiratory: Negative for cough or  hemoptysis Negative for  shortness of breath.  Cardiovascular: Negative for chest pain. Negative for palpitations.  GI:  As per HPI. change in the caliber of the stools with some mucus in it. not  known history of ulcers, jaundice or colitis. No further bleeding since admission [id number] for hematuria. No loss of urinary control. No Urinary retention.  Skin: Negative for itching. No rash. No petechia.  No bruising. She has known nevi removed from her skin over the last decade. Neurological: No headaches. No motor or sensory deficits.No confusion. High frequency hearing loss, chronic.     Physical exam:      Filed Vitals:   10/27/12 0534  BP: 170/82  Pulse: 81  Temp: 98.4 F (36.9 C)  Resp: 20     Body mass index is 24.11 kg/(m^2).   General: 67 y.o. white  female  in no acute distress A. and O. x3  well-developed and well-nourished. Looks younger than stated age.  HEENT: Normocephalic, atraumatic, PERRLA. Oral cavity without thrush or lesions. Neck supple. no thyromegaly, no cervical or supraclavicular adenopathy  Lungs clear bilaterally . No wheezing, rhonchi or rales. No axillary masses. Breasts: not examined. Cardiac regular rate and rhythm normal S1-S2, 2/6 harsh systolic  murmur ,no  rubs or gallops Abdomen soft nontender , bowel sounds x4. No HSM. No masses palpable.  GU/rectal: deferred. Extremities no clubbing, no  cyanosis or edema. No bruising or petechial rash Musculoskeletal: no spinal tenderness. Skin sun exposed. Multiple nevi.  Neuro: Non Focal   Lab results:       CBC  Recent Labs Lab 10/23/12 1637  10/24/12 0505  10/25/12 1032 10/25/12 1937 10/26/12 0242 10/26/12 1154 10/27/12 0605  WBC 8.7  --   8.3  --   --   --   --   --  6.4  HGB 11.3*  < > 9.5*  < > 8.6* 10.0* 9.1* 9.8* 10.1*  HCT 33.3*  < > 28.3*  < > 26.3* 30.1* 26.9* 28.9* 30.1*  PLT 353  --  332  --   --   --   --   --  345  MCV 79.1  --  79.1  --   --   --   --   --  79.4  MCH 26.8  --  26.5  --   --   --   --   --  26.6  MCHC 33.9  --  33.6  --   --   --   --   --  33.6  RDW 14.9  --  14.8  --   --   --   --   --  15.3  LYMPHSABS 2.3  --   --   --   --   --   --   --   --   MONOABS 0.5  --   --   --   --   --   --   --   --   EOSABS 0.3  --   --   --   --   --   --   --   --   BASOSABS 0.1  --   --   --   --   --   --   --   --   < > = values in this interval not displayed.   Chemistries   Recent Labs Lab 10/23/12 1637 10/24/12 0505 10/27/12 0605  NA 138 134* 140  K 3.6 4.0 4.0  CL 98 98 103  CO2 25 27 27   GLUCOSE 133* 124* 145*  BUN 15 13 8   CREATININE 0.56 0.54 0.66  CALCIUM 10.4 9.5 10.0     Coagulation profile  Recent Labs Lab 10/23/12 1637 10/24/12 0505  INR 1.02 1.04    Studies:       Ct Chest W Contrast  10/25/2012   * Comparison:  CT abdomen/pelvis without contrast 09/10/2010  CT CHEST  Findings:  There is a lobulated irregular mass in the superior segment right lower lobe measuring 5.0 x 4.6 cm image 41.  Rim calcified right upper lobe 0.8 cm nodule which is centrally lucent is noted on image 25.  2 and 3 mm left intrapulmonary lymph node abutting the major fissure are noted images 29 and 31.  Central airways are patent.  Heart size is normal.  No lymphadenopathy.  No pericardial or pleural effusion.  There is mild osteopenia in the thoracic spine with multilevel disc degenerative change but no focal lytic or sclerotic osseous lesion is identified.  IMPRESSION: Irregular right lower lobe superior segment pulmonary mass which primary considerations include primary bronchogenic carcinoma or less likely solitary metastasis.  Ct Abdomen Pelvis W Contrast  10/25/2012  Comparison:  CT  abdomen/pelvis without contrast 09/10/2010    CT ABDOMEN AND PELVIS  Findings:  There is mass-like rectosigmoid colonic wall thickening measuring 2.9 cm in thickness image 102.  Adjacent pericolonic stranding and abnormal lymphadenopathy is noted, with dominant right pericolonic lymph node measuring 0.8 cm image 102.  The mass- like thickening immediately abuts the bladder but no obvious intravesical invasion is identified allowing for technique.  4 mm short axis lymph node image 85 at the iliac artery bifurcation is nonspecific but new since the prior exam as well and suspicious. Uterus and ovaries are not identified, compatible with prior total hysterectomy per report.  Small bowel and appendix are normal.  Liver, kidneys, adrenal glands, spleen, gallbladder, and pancreas are normal.  Degenerative changes are noted in the spine with areas of trabecular rarefaction but no focal identifiable osseous lesion.  IMPRESSION: Mass-like rectosigmoid wall thickening, surrounding stranding, and adjacent lymphadenopathy, compatible with malignancy with local nodal spread.  Inflammation and local reactive lymph nodes would be much less likely.  PET CT is recommended for global staging purposes and for further evaluation of the above described lung mass, as well as surgical consultation.  These results will be called to the ordering clinician or representative by the Radiologist Assistant, and communication documented in the PACS Dashboard.   Original Report Authenticated By: Christiana Pellant, M.D.    Assessmnent/Plan:66 y.o.Physician asked to see in consultation for evaluation of rectosigmoid mass with adjacent lymphadenopathy, compatible with malignancy with local nodal spread, s/p colonoscopy with biopsies, with results pending.Patient had presented with acute episode of rectal bleeding, currently with no further bleed.  In addition, a CT of the chest revealed an irregular RLL superior segment pulmonary mass which primary  considerations include primary bronchogenic carcinoma versus solitary metastasis. She underwent bronchoscopy today with results pending.    Dr. Welton Flakes  is to see the patient following this consult with recommendations regarding diagnosis, treatment options  After discharge from the hospital, and further workup studies.  An addendum to this note is to be written. Thank you for the referral.   Marcos Eke, PA-C 10/27/2012   ATTENDING'S ATTESTATION:  I personally reviewed patient's chart, examined patient myself, formulated the treatment plan as followed.    Patient and I had an extensive discussion today regarding diagnosis of rectal cancer and the lung mass. We discussed her treatment options which would include chemotherapy given systemically for the rectal cancer. She is also very interested in being aggressive about the lung mass. She had a pulmonary consultation and a bronchoscopy performed earlier today. We will await the results of this. Question is whether this is a primary lung malignancy versus a  Metastasis.  I will plan on seeing the patient in my clinic after discharge.  Drue Second, MD Medical/Oncology [facility] [phone] (beeper) [phone] (Office)  11/02/2012, 7:39 PM

## 2012-10-27 NOTE — Progress Notes (Signed)
Video bronchoscopy washing intervention, biopsy intervention, brushing intervention

## 2012-10-28 ENCOUNTER — Telehealth: Payer: Self-pay | Admitting: Oncology

## 2012-10-28 ENCOUNTER — Encounter (HOSPITAL_COMMUNITY): Payer: Self-pay | Admitting: Pulmonary Disease

## 2012-10-28 ENCOUNTER — Telehealth: Payer: Self-pay | Admitting: Pulmonary Disease

## 2012-10-28 DIAGNOSIS — Z79890 Hormone replacement therapy (postmenopausal): Secondary | ICD-10-CM

## 2012-10-28 LAB — GLUCOSE, CAPILLARY: Glucose-Capillary: 142 mg/dL — ABNORMAL HIGH (ref 70–99)

## 2012-10-28 MED ORDER — PNEUMOCOCCAL VAC POLYVALENT 25 MCG/0.5ML IJ INJ
0.5000 mL | INJECTION | Freq: Once | INTRAMUSCULAR | Status: AC
  Administered 2012-10-28: 0.5 mL via INTRAMUSCULAR
  Filled 2012-10-28: qty 0.5

## 2012-10-28 MED ORDER — POLYETHYLENE GLYCOL 3350 17 G PO PACK: 17.0000 g | PACK | Freq: Every day | ORAL | Status: DC

## 2012-10-28 NOTE — Telephone Encounter (Signed)
BAL cytology >> atypical cells, but non diagnostic  Brush cytology >> adenocarcinoma  Transbronchial biopsy >> results pending.  [name] was d/c home earlier today.  Spoke to her about preliminary bronchoscopy results.  She has meet with oncology (Dr. Welton Flakes).  She is scheduled for outpt follow up with colo-rectal surgery, and will be scheduled for referral to thoracic surgery.  Advised [name] she could call pulmonary office for further assistance/questions if needed.  Will route information to Dr. Fabian Sharp and Dr. Welton Flakes to be aware of conversation.

## 2012-10-28 NOTE — Discharge Summary (Addendum)
PATIENT DETAILS Name: Anne [name] Age: 67 y.o. Sex: female Date of Birth: 1945/04/21 MRN: [id number]. Admit Date: 10/23/2012 Admitting Physician: Jonah Blue, DO VOZ:DGUYQI,HKVQQ Clayborne Dana, MD  Recommendations for Outpatient Follow-up:  1. Please follow biopsy results from a right lung mass-bronchoscopy done on 7/31. 2. Will need further workup, cardiothoracic referral at the discretion of oncology-Dr. Welton Flakes 3. Please monitor CBC closely-suggest to check CBC at next visit.  PRIMARY DISCHARGE DIAGNOSIS:  Principal Problem:   Bright red blood per rectum- secondary to colorectal cancer Active Problems:   Right lung mass-not sure whether this is a second primary or a solitary metastasis from colorectal cancer.   Acute blood loss anemia from rectal bleeding- no transfusion required.   Postmenopausal HRT (hormone replacement therapy)   Hx of colonic polyps   DM type 2 (diabetes mellitus, type 2)      PAST MEDICAL HISTORY: Past Medical History  Diagnosis Date  . Heart murmur, systolic 07/01/2011  . Postmenopausal HRT (hormone replacement therapy) 07/01/2011    surgical menopause  . Hx of hyperglycemia 07/01/2011  . Hx of compression fracture of spine 1985    t12 after a  20 foot fall  . Hx of fracture of wrist     after fall   . 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 Hx of colonic polyps   . Early cataract     stoneburner   . High frequency hearing loss     [facility] followed   . Diabetes mellitus without complication   . Cancer   . Skin cancer     DISCHARGE MEDICATIONS:   Medication List         acetaminophen 325 MG tablet  Commonly known as:  TYLENOL  Take 650 mg by mouth every 6 (six) hours as needed for pain.     CALTRATE 600+D PO  Take 1 tablet by mouth daily.     carbamide peroxide 6.5 % otic solution  Commonly known as:  DEBROX  Place 5 drops into both ears 2 (two) times daily.     metFORMIN 1000 MG tablet  Commonly known as:  GLUCOPHAGE  Take 1,000 mg  by mouth 2 (two) times daily with a meal.     METHYLTESTOSTERONE PO  Take 1.25 mg by mouth daily.     MINIVELLE 0.0375 MG/24HR  Generic drug:  estradiol  Place 1 patch onto the skin 2 (two) times a week. Sundays and Thursdays     multivitamin tablet  Take 1 tablet by mouth daily. Senior     polyethylene glycol packet  Commonly known as:  MIRALAX / GLYCOLAX  Take 17 g by mouth daily.     simethicone 125 MG chewable tablet  Commonly known as:  MYLICON  Chew 125 mg by mouth every 6 (six) hours as needed for flatulence.     sitaGLIPtin 100 MG tablet  Commonly known as:  JANUVIA  Take 100 mg by mouth daily.     SOLUBLE FIBER/PROBIOTICS PO  Take 1 tablet by mouth daily.     TUMS E-X PO  Take 2 tablets by mouth daily.       PROCEDURES PERFORMED 1. Colonoscopy 09/2912-Dr Marina Goodell 2. Bronchoscopy 10/27/12-Dr Sood  ALLERGIES:   Allergies  Allergen Reactions  . Midazolam Other (See Comments)    Caused very crazy feelings, terrors, panic attacks, anxiety  . Zolpidem Tartrate Other (See Comments)    Caused very crazy feelings, terrors, shaking, panic attacks, anxiety  .  Neomycin-Bacitracin Zn-Polymyx Itching and Rash    BRIEF HPI:  See H&P, Labs, Consult and Test reports for all details in brief, patient is a 67 year old Caucasian female with a past medical history of diabetes was brought to the emergency room on 10/23/12 with the complaints of bright red blood per rectum. She was then admitted for further evaluation and treatment.  CONSULTATIONS:   GI, hematology/oncology and general surgery  PERTINENT RADIOLOGIC STUDIES: Ct Chest W Contrast  10/25/2012   *RADIOLOGY REPORT*  Clinical Data:  Rectal bleeding, rectal mass at colonoscopy 10/24/2012  CT CHEST, ABDOMEN AND PELVIS WITH CONTRAST  Technique:  Multidetector CT imaging of the chest, abdomen and pelvis was performed following the standard protocol during bolus administration of intravenous contrast.  Contrast:  OMNIPAQUE IOHEXOL 300 MG/ML  SOLN  Comparison:  CT abdomen/pelvis without contrast 09/10/2010  CT CHEST  Findings:  There is a lobulated irregular mass in the superior segment right lower lobe measuring 5.0 x 4.6 cm image 41.  Rim calcified right upper lobe 0.8 cm nodule which is centrally lucent is noted on image 25.  2 and 3 mm left intrapulmonary lymph node abutting the major fissure are noted images 29 and 31.  Central airways are patent.  Heart size is normal.  No lymphadenopathy.  No pericardial or pleural effusion.  There is mild osteopenia in the thoracic spine with multilevel disc degenerative change but no focal lytic or sclerotic osseous lesion is identified.  IMPRESSION: Irregular right lower lobe superior segment pulmonary mass which primary considerations include primary bronchogenic carcinoma or less likely solitary metastasis.  CT ABDOMEN AND PELVIS  Findings:  There is mass-like rectosigmoid colonic wall thickening measuring 2.9 cm in thickness image 102.  Adjacent pericolonic stranding and abnormal lymphadenopathy is noted, with dominant right pericolonic lymph node measuring 0.8 cm image 102.  The mass- like thickening immediately abuts the bladder but no obvious intravesical invasion is identified allowing for technique.  4 mm short axis lymph node image 85 at the iliac artery bifurcation is nonspecific but new since the prior exam as well and suspicious. Uterus and ovaries are not identified, compatible with prior total hysterectomy per report.  Small bowel and appendix are normal.  Liver, kidneys, adrenal glands, spleen, gallbladder, and pancreas are normal.  Degenerative changes are noted in the spine with areas of trabecular rarefaction but no focal identifiable osseous lesion.  IMPRESSION: Mass-like rectosigmoid wall thickening, surrounding stranding, and adjacent lymphadenopathy, compatible with malignancy with local nodal spread.  Inflammation and local reactive lymph nodes would be much  less likely.  PET CT is recommended for global staging purposes and for further evaluation of the above described lung mass, as well as surgical consultation.  These results will be called to the ordering clinician or representative by the Radiologist Assistant, and communication documented in the PACS Dashboard.   Original Report Authenticated By: Christiana Pellant, M.D.   Ct Abdomen Pelvis W Contrast  10/25/2012   *RADIOLOGY REPORT*  Clinical Data:  Rectal bleeding, rectal mass at colonoscopy 10/24/2012  CT CHEST, ABDOMEN AND PELVIS WITH CONTRAST  Technique:  Multidetector CT imaging of the chest, abdomen and pelvis was performed following the standard protocol during bolus administration of intravenous contrast.  Contrast: OMNIPAQUE IOHEXOL 300 MG/ML  SOLN  Comparison:  CT abdomen/pelvis without contrast 09/10/2010  CT CHEST  Findings:  There is a lobulated irregular mass in the superior segment right lower lobe measuring 5.0 x 4.6 cm image 41.  Rim calcified  right upper lobe 0.8 cm nodule which is centrally lucent is noted on image 25.  2 and 3 mm left intrapulmonary lymph node abutting the major fissure are noted images 29 and 31.  Central airways are patent.  Heart size is normal.  No lymphadenopathy.  No pericardial or pleural effusion.  There is mild osteopenia in the thoracic spine with multilevel disc degenerative change but no focal lytic or sclerotic osseous lesion is identified.  IMPRESSION: Irregular right lower lobe superior segment pulmonary mass which primary considerations include primary bronchogenic carcinoma or less likely solitary metastasis.  CT ABDOMEN AND PELVIS  Findings:  There is mass-like rectosigmoid colonic wall thickening measuring 2.9 cm in thickness image 102.  Adjacent pericolonic stranding and abnormal lymphadenopathy is noted, with dominant right pericolonic lymph node measuring 0.8 cm image 102.  The mass- like thickening immediately abuts the bladder but no obvious  intravesical invasion is identified allowing for technique.  4 mm short axis lymph node image 85 at the iliac artery bifurcation is nonspecific but new since the prior exam as well and suspicious. Uterus and ovaries are not identified, compatible with prior total hysterectomy per report.  Small bowel and appendix are normal.  Liver, kidneys, adrenal glands, spleen, gallbladder, and pancreas are normal.  Degenerative changes are noted in the spine with areas of trabecular rarefaction but no focal identifiable osseous lesion.  IMPRESSION: Mass-like rectosigmoid wall thickening, surrounding stranding, and adjacent lymphadenopathy, compatible with malignancy with local nodal spread.  Inflammation and local reactive lymph nodes would be much less likely.  PET CT is recommended for global staging purposes and for further evaluation of the above described lung mass, as well as surgical consultation.  These results will be called to the ordering clinician or representative by the Radiologist Assistant, and communication documented in the PACS Dashboard.   Original Report Authenticated By: Christiana Pellant, M.D.   Dg Chest Port 1 View  10/27/2012   *RADIOLOGY REPORT*  Clinical Data: Post bronchoscopy  PORTABLE CHEST - 1 VIEW  Comparison: 10/25/2012  Findings: Cardiomediastinal silhouette is stable.  Again noted nodular mass in the right lower lobe measures about 5.7 cm.  Mild thoracic dextroscoliosis.  No acute infiltrate or pulmonary edema. There is no diagnostic pneumothorax.  IMPRESSION: No active disease.  Mild thoracic dextroscoliosis.  Again noted nodular mass in right lower lobe measures 5.7 cm.  No diagnostic pneumothorax.   Original Report Authenticated By: Natasha Mead, M.D.   Dg C-arm Bronchoscopy  10/27/2012   CLINICAL DATA: lung mass   C-ARM BRONCHOSCOPY  Fluoroscopy was utilized by the requesting physician.  No radiographic  interpretation.      PERTINENT LAB RESULTS: CBC:  Recent Labs  10/26/12 1154  10/27/12 0605  WBC  --  6.4  HGB 9.8* 10.1*  HCT 28.9* 30.1*  PLT  --  345   CMET CMP     Component Value Date/Time   NA 140 10/27/2012 0605   K 4.0 10/27/2012 0605   CL 103 10/27/2012 0605   CO2 27 10/27/2012 0605   GLUCOSE 145* 10/27/2012 0605   BUN 8 10/27/2012 0605   CREATININE 0.66 10/27/2012 0605   CALCIUM 10.0 10/27/2012 0605   PROT 6.2 10/24/2012 0505   ALBUMIN 3.4* 10/24/2012 0505   AST 13 10/24/2012 0505   ALT 9 10/24/2012 0505   ALKPHOS 51 10/24/2012 0505   BILITOT 0.2* 10/24/2012 0505   GFRNONAA 89* 10/27/2012 0605   GFRAA >90 10/27/2012 0605    GFR Estimated  Creatinine Clearance: 60 ml/min (by C-G formula based on Cr of 0.66). No results found for this basename: LIPASE, AMYLASE,  in the last 72 hours No results found for this basename: CKTOTAL, CKMB, CKMBINDEX, TROPONINI,  in the last 72 hours No components found with this basename: POCBNP,  No results found for this basename: DDIMER,  in the last 72 hours No results found for this basename: HGBA1C,  in the last 72 hours No results found for this basename: CHOL, HDL, LDLCALC, TRIG, CHOLHDL, LDLDIRECT,  in the last 72 hours No results found for this basename: TSH, T4TOTAL, FREET3, T3FREE, THYROIDAB,  in the last 72 hours No results found for this basename: VITAMINB12, FOLATE, FERRITIN, TIBC, IRON, RETICCTPCT,  in the last 72 hours Coags: No results found for this basename: PT, INR,  in the last 72 hours Microbiology: Recent Results (from the past 240 hour(s))  URINE CULTURE     Status: None   Collection Time    10/24/12  2:24 AM      Result Value Range Status   Specimen Description URINE, CLEAN CATCH   Final   Special Requests ADDED 0255   Final   Culture  Setup Time 10/24/2012 03:22   Final   Colony Count NO GROWTH   Final   Culture NO GROWTH   Final   Report Status 10/25/2012 FINAL   Final  CULTURE, BAL-QUANTITATIVE     Status: None   Collection Time    10/27/12 11:46 AM      Result Value Range Status   Specimen  Description BRONCHIAL ALVEOLAR LAVAGE   Final   Special Requests RLL   Final   Gram Stain     Final   Value: NO WBC SEEN     NO ORGANISMS SEEN   Colony Count PENDING   Incomplete   Culture Culture reincubated for better growth   Final   Report Status PENDING   Incomplete   PATHOLOGY Rectum, biopsy, Mass - POSITIVE FOR ADENOCARCINOMA.  BRIEF HOSPITAL COURSE:  Bright red blood per rectum - From underlying colorectal cancer - Patient was admitted, GI consultation was obtained, a colonoscopy was done on 10/25/12 which showed a bulky, circumferential, friable mass lesion in the rectum approximately 4 cm above the dentate line. Biopsy was obtained during colonoscopy, biopsy results have come back positive for adenocarcinoma. Staging CT of the abdomen and CT of the chest was done. - CT of the abdomen did show a mass like rectosigmoid wall thickening with adjacent lymphadenopathy, compatible with malignancy with local spread. - CT of the chest showed a irregular right lower lobe superior segment mass (see below) - Patient was seen in consult by gastroenterology, general surgery and oncology. Thankfully she is tolerating a regular diet without any obstructive symptoms. At this time, she is being discharged, Dr. Welton Flakes (oncology) , will arrange for further outpatient care at the office. She will also arrange for outpatient PET scan, along with cardiothoracic surgery and Gen. surgery referral.  Right lung mass - As noted above, during staging of her presumed colorectal cancer, a CT of the chest was done which showed a lobulated irregular mass in the superior segment right lower lobe measuring 5.0 x 4.6 cm. At this time it is uncertain, whether this is a second primary or a solitary metastatic lesion. - Pulmonology was consulted, Dr. Craige Cotta, provided consultation, patient underwent a bronchoscopy on 7/31 with biopsy. Currently biopsy results are pending, and this will be followed by Dr.Khan, who will then  arrange for further continued care as noted above. Further referral to cardiothoracic surgery will also be done by Dr. Welton Flakes once the biopsy results are available.  Acute blood loss anemia - This is secondary to rectal bleeding from her underlying malignancy. - Hemoglobin and hematocrit were monitored closely, last hemoglobin as of 09/29/12 was 10.1. She did not require any PRBC transfusion during her inpatient stay. - It is suggested that upon followup with her primary care practitioner next week, CBC be checked and it be monitored closely.  Diabetes - During inpatient stay, her oral hypoglycemic agents were discontinued, she was placed on sliding scale insulin with good CBG control. She will be resumed on her oral hypoglycemic agents on discharge. - She has been advised to stop metformin 48 hours after receiving contrast for a CT scan in the future as well.  Postmenopausal hormone replacement therapy - At this time, this will be continued. Spoke with Dr. Welton Flakes, leading these medications, at this time we do not see any reason for her not to continue on them.  TODAY-DAY OF DISCHARGE:  Subjective:   Anne [name] today has no headache,no chest abdominal pain. She is stable to be discharged home today, with outpatient followup with oncology and the other services as noted above.  Objective:   Blood pressure 135/89, pulse 69, temperature 97.8 F (36.6 C), temperature source Oral, resp. rate 18, height 5' 4.8" (1.646 m), weight 65.318 kg (144 lb), SpO2 97.00%.  Intake/Output Summary (Last 24 hours) at 10/28/12 1043 Last data filed at 10/27/12 1853  Gross per 24 hour  Intake    120 ml  Output      0 ml  Net    120 ml   Filed Weights   10/23/12 2223 10/25/12 0635 10/27/12 1004  Weight: 65.817 kg (145 lb 1.6 oz) 65.318 kg (144 lb) 65.318 kg (144 lb)    Exam Awake Alert, Oriented *3, No new F.N deficits, Normal affect [place].AT,PERRAL Supple Neck,No JVD, No cervical lymphadenopathy  appriciated.  Symmetrical Chest wall movement, Good air movement bilaterally, CTAB RRR,No Gallops,Rubs or new Murmurs, No Parasternal Heave +ve B.Sounds, Abd Soft, Non tender, No organomegaly appriciated, No rebound -guarding or rigidity. No Cyanosis, Clubbing or edema, No new Rash or bruise  DISCHARGE CONDITION: Stable  DISPOSITION: Home  DISCHARGE INSTRUCTIONS:    Activity:  As tolerated   Diet recommendation: Diabetic Diet  Discharge Orders   Future Appointments Provider Department Dept Phone   11/02/2012 4:00 PM Madelin Headings, MD [facility] [phone]   11/03/2012 2:10 PM Romie Levee, MD [facility] [place] [phone]   Future Orders Complete By Expires     Call MD for:  persistant nausea and vomiting  As directed     Call MD for:  severe uncontrolled pain  As directed     Diet Carb Modified  As directed     Increase activity slowly  As directed        Follow-up Information   Follow up with Vanita Panda., MD On 11/03/2012. (2:10pm, arrive by 1:45pm)    Contact information:   [address], [address] [place] [address] [phone]       Follow up with [name], MD. Schedule an appointment as soon as possible for a visit on 11/02/2012. (at 4pm)    Contact information:   [address] [place] [address] [phone]       Follow up with Drue Second, MD. (Office will call  for an appointment)    Contact information:   [address] [place] [address] [phone]         Total Time spent on discharge equals 45 minutes.  SignedJeoffrey Massed 10/28/2012 10:43 AM

## 2012-10-28 NOTE — Telephone Encounter (Signed)
S/W PT IN [facility]OS[facility] F/U APPT 08/7 @ 3:45 W/DR. [facility] WELCOME PACKET MAILED.

## 2012-10-28 NOTE — Progress Notes (Signed)
Follow up with Dr. Romie Levee scheduled and the patient is aware.  Having bms, tolerating diet.  Comfortable with the current plan of care.  Discharge per primary team.    Ashok Norris, ANP-BC

## 2012-10-28 NOTE — Progress Notes (Signed)
Patient discharge teaching given, including activity, diet, follow-up appoints, and medications. Patient verbalized understanding of all discharge instructions. IV access was d/c'd. Vitals are stable. Skin is intact except as charted in most recent assessments. Pt to be escorted out by NT, to be driven home by family. 

## 2012-10-30 LAB — CULTURE, BAL-QUANTITATIVE W GRAM STAIN
Colony Count: >=100000
Gram Stain: NONE SEEN

## 2012-11-01 ENCOUNTER — Ambulatory Visit: Payer: 59

## 2012-11-01 ENCOUNTER — Telehealth: Payer: Self-pay | Admitting: Oncology

## 2012-11-01 ENCOUNTER — Ambulatory Visit (INDEPENDENT_AMBULATORY_CARE_PROVIDER_SITE_OTHER): Payer: Commercial Managed Care - PPO | Admitting: General Surgery

## 2012-11-01 ENCOUNTER — Encounter (INDEPENDENT_AMBULATORY_CARE_PROVIDER_SITE_OTHER): Payer: Self-pay | Admitting: General Surgery

## 2012-11-01 ENCOUNTER — Ambulatory Visit (HOSPITAL_BASED_OUTPATIENT_CLINIC_OR_DEPARTMENT_OTHER): Payer: 59 | Admitting: Oncology

## 2012-11-01 ENCOUNTER — Telehealth: Payer: Self-pay | Admitting: *Deleted

## 2012-11-01 VITALS — BP 142/74 | HR 78 | Resp 14 | Ht 64.5 in | Wt 140.0 lb

## 2012-11-01 VITALS — BP 166/75 | HR 87 | Temp 98.0°F

## 2012-11-01 DIAGNOSIS — C7800 Secondary malignant neoplasm of unspecified lung: Secondary | ICD-10-CM

## 2012-11-01 DIAGNOSIS — E119 Type 2 diabetes mellitus without complications: Secondary | ICD-10-CM

## 2012-11-01 DIAGNOSIS — C20 Malignant neoplasm of rectum: Secondary | ICD-10-CM

## 2012-11-01 NOTE — Telephone Encounter (Signed)
appts made and printed. Pt is aware that tx will be added. i emailed MB to add tx. Pt is aware that i am waiting for Cindy from [place] office to give me a call w/ an appt d/t for her...td

## 2012-11-01 NOTE — Assessment & Plan Note (Signed)
Patient has a new diagnosis of stage IV rectal cancer.  She has a PET scan scheduled tomorrow and a appointment with thoracic surgery on Thursday to discuss her lung mass. Depending on the results of that conversation, she will either proceed with thoracic surgery first versus chemotherapy.  I suspect they will want to proceed with chemotherapy first to make sure she does not develop more lesions.  Also, she would be getting systemic therapy rather than local therapy. We will go ahead and schedule port a cath placement as she has tentative start date for chemotherapy 8/21.    I discussed that we are a long way off from potential rectal cancer surgery.   Should she remain free of metastases after resection of lung metastasis, she could then proceed with chemoradiation followed by LAR with coloanal anastamosis with ileostomy.    She is in good health and wants to be aggressive.   45 min spent with visit.  >50% in counseling.

## 2012-11-01 NOTE — Progress Notes (Signed)
Chief Complaint  Patient presents with  . New Evaluation    eval rectal cancer    HISTORY: The patient is a 67 year old female who presented around a week ago to the hospital with bright red blood per rectum. 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 She had a colonoscopy 2 years ago that demonstrated 3 small adenomatous polyps. She was due for another colonoscopy next year. She went emergently to the hospital and bright red blood per rectum workup revealed a rectal cancer that was circumferential at 4 cm from the anal verge.  CT scans revealed a 4 cm lung mass with spiculations. She did not have any evidence of metastases in her liver. She does not have obstructive symptoms. She has been feeling great prior to last week. She lost 60 pounds over the last year from diet and exercise. 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 She also travels significantly for work.  She has not had any unanticipated weight loss. She has not had shortness of breath or chest pain.  She does have a significant family history for cancer. She has a sister that had breast and ovarian cancer. She also had a brother with retroperitoneal sarcoma, however he served in [place].  She is accompanied by her daughter and 2 of her grandchildren. Her father died at age 18 and her mother is alive at age 35.     Past Medical History  Diagnosis Date  . Heart murmur, systolic 07/01/2011  . Postmenopausal HRT (hormone replacement therapy) 07/01/2011    surgical menopause  . Hx of hyperglycemia 07/01/2011  . Hx of compression fracture of spine 1985    t12 after a  20 foot fall  . Hx of fracture of wrist     after fall   . 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 Hx of colonic polyps   . Early cataract     stoneburner   . High frequency hearing loss     [facility]raus followed   . Diabetes mellitus without complication   . Cancer   . Skin cancer     Past  Surgical History  Procedure Laterality Date  . Tonsillectomy  1952  . Adenoidectomy  1952  . Wrist surgery  1957    left reduction  . Compression fraction  1985    T12  . Anterior cruciate ligament repair  1993    right Murphy  . Cosmetic surgery  1999-2007    holderness blepharoplasty facial rhytidectomy mastopexy abdominoplasty   . Dental implant    . Dilation and curettage of uterus  1977  . Total abdominal hysterectomy  2005    with cystocele repair   . Colonoscopy N/A 10/24/2012    Procedure: COLONOSCOPY;  Surgeon: Hilarie Fredrickson, MD;  Location: [facility] ENDOSCOPY;  Service: Endoscopy;  Laterality: N/A;  . Colonoscopy N/A 10/25/2012    Procedure: COLONOSCOPY;  Surgeon: Hilarie Fredrickson, MD;  Location: [facility] ENDOSCOPY;  Service: Endoscopy;  Laterality: N/A;  . Video bronchoscopy Bilateral 10/27/2012    Procedure: VIDEO BRONCHOSCOPY WITH FLUORO;  Surgeon: Coralyn Helling, MD;  Location: [facility] ENDOSCOPY;  Service: Endoscopy;  Laterality: Bilateral;    Current Outpatient Prescriptions  Medication Sig Dispense Refill  . acetaminophen (TYLENOL) 325 MG tablet Take 650 mg by mouth every 6 (six) hours as needed for pain.      . Calcium Carbonate Antacid (TUMS E-X  PO) Take 2 tablets by mouth daily.      . Calcium Carbonate-Vitamin D (CALTRATE 600+D PO) Take 1 tablet by mouth daily.       . carbamide peroxide (DEBROX) 6.5 % otic solution Place 5 drops into both ears 2 (two) times daily.      Marland Kitchen estradiol (MINIVELLE) 0.0375 MG/24HR Place 1 patch onto the skin 2 (two) times a week. Sundays and Thursdays      . metFORMIN (GLUCOPHAGE) 1000 MG tablet Take 1,000 mg by mouth 2 (two) times daily with a meal.      . METHYLTESTOSTERONE PO Take 1.25 mg by mouth daily.      . Multiple Vitamin (MULTIVITAMIN) tablet Take 1 tablet by mouth daily. Senior      . polyethylene glycol (MIRALAX / GLYCOLAX) packet Take 17 g by mouth daily.  100 each  0  . Probiotic Product (SOLUBLE FIBER/PROBIOTICS PO) Take 1 tablet by mouth daily.       . simethicone (MYLICON) 125 MG chewable tablet Chew 125 mg by mouth every 6 (six) hours as needed for flatulence.      . sitaGLIPtin (JANUVIA) 100 MG tablet Take 100 mg by mouth daily.       No current facility-administered medications for this visit.     Allergies  Allergen Reactions  . Midazolam Other (See Comments)    Caused very crazy feelings, terrors, panic attacks, anxiety  . Zolpidem Tartrate Other (See Comments)    Caused very crazy feelings, terrors, shaking, panic attacks, anxiety  . Neomycin-Bacitracin Zn-Polymyx Itching and Rash     Family History  Problem Relation Age of Onset  . Diabetes Father   . Cancer Father     oral  . Thyroid disease Father   . Heart attack Father     died age 38  . Dementia Mother   . Hypertension Mother   . Hyperlipidemia Mother   . Cancer Brother     retro lymphosarcoma agent orange  . Cancer Sister     clear cell ovarina and breast insitu  . Obesity      siblings   . Diabetes Brother   . Alcohol abuse Brother     hx of  . Other      arrythmia   . Hypertension Sister   . Asthma Sister      History   Social History  . Marital Status: Widowed    Spouse Name: N/A    Number of Children: N/A  . Years of Education: N/A   Social History Main Topics  . Smoking status: Never Smoker   . Smokeless tobacco: Never Used  . Alcohol Use: 1.0 oz/week    2 drink(s) per week  . Drug Use: No  . Sexually Active: No   Other Topics Concern  . None   Social History Narrative   Divorced  2006 after 63 years marriage    MD developmental Dance movement psychotherapist    Social etoh   NON smoker   exercise 4 x per week  Has personal trainer   G4P2     REVIEW OF SYSTEMS - PERTINENT POSITIVES ONLY: 12 point review of systems negative other than HPI and PMH except for rectal bleeding.   EXAM: Filed Vitals:   11/01/12 1627  BP: 142/74  Pulse: 78  Resp: 14   Filed Weights   11/01/12 1627  Weight: 140 lb (63.504 kg)     Gen:  No  acute distress.  Well nourished and  well groomed.   Neurological: Alert and oriented to person, place, and time. Coordination normal.  Head: Normocephalic and atraumatic.   Eyes: Conjunctivae are normal. Pupils are equal, round, and reactive to light. No scleral icterus.  Neck: Normal range of motion. Neck supple. No tracheal deviation or thyromegaly present. no carotid bruits. Cardiovascular: Normal rate, regular rhythm,  and intact distal pulses.  Exam reveals no gallop and no friction rub.  + systolic murmur.   Respiratory: Effort normal.  No respiratory distress. No chest wall tenderness. Breath sounds normal.  No wheezes, rales or rhonchi.  GI: Soft. Bowel sounds are normal. The abdomen is soft and nontender.  There is no rebound and no guarding.  Musculoskeletal: Normal range of motion. Extremities are nontender.  Lymphadenopathy: No cervical, preauricular, postauricular or axillary adenopathy is present Skin: Skin is warm and dry. No rash noted. No diaphoresis. No erythema. No pallor. No clubbing, cyanosis, or edema.   Psychiatric: Normal mood and affect. Behavior is normal. Judgment and thought content normal.    LABORATORY RESULTS: Available labs are reviewed  H&H 9.8, 29, glucose 145 on bmet.    Lung biopsy pathology Lung, biopsy, Right lower lobe - METASTATIC ADENOCARCINOMA, CONSISTENT WITH COLON PRIMARY. - SEE COMMENT. Microscopic Comment The malignant cells are positive for CDX2 and Cytokeratin 20. They are negative for Cytokeratin 7 and TTF1. (JBK:caf 10/31/12)  Rectal pathology Rectum, biopsy, Mass - POSITIVE FOR ADENOCARCINOMA.   RADIOLOGY RESULTS: See E-Chart or I-Site for most recent results.  Images and reports are reviewed. Ct chest IMPRESSION:  Irregular right lower lobe superior segment pulmonary mass which  primary considerations include primary bronchogenic carcinoma or  less likely solitary metastasis  Ct abdomen Mass-like rectosigmoid wall thickening,  surrounding stranding, and  adjacent lymphadenopathy, compatible with malignancy with local  nodal spread. Inflammation and local reactive lymph nodes would be  much less likely.  PET CT is recommended for global staging purposes and for further  evaluation of the above described lung mass, as well as surgical  consultation.    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  She has a PET scan scheduled tomorrow and a appointment with thoracic surgery on Thursday to discuss her lung mass. Depending on the results of that conversation, she will either proceed with thoracic surgery first versus chemotherapy.  I suspect they will want to proceed with chemotherapy first to make sure she does not develop more lesions.  Also, she would be getting systemic therapy rather than local therapy. We will go ahead and schedule port a cath placement as she has tentative start date for chemotherapy 8/21.    I discussed that we are a long way off from potential rectal cancer surgery.   Should she remain free of metastases after resection of lung metastasis, she could then proceed with chemoradiation followed by LAR with coloanal anastamosis with ileostomy.    She is in good health and wants to be aggressive.   45 min spent with visit.  >50% in counseling.       Maudry Diego MD Surgical Oncology, General and Endocrine Surgery [facility]      Visit Diagnoses: 1. 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

## 2012-11-01 NOTE — Telephone Encounter (Signed)
PER MD R/S 08/05 @ 12

## 2012-11-01 NOTE — Telephone Encounter (Signed)
Thanks

## 2012-11-01 NOTE — Telephone Encounter (Signed)
C/D 11/01/12 for appt. 11/01/12

## 2012-11-01 NOTE — Patient Instructions (Signed)
I will have my office schedule port a cath.  Let us know about any change in plans regarding timing of chemotherapy.

## 2012-11-02 ENCOUNTER — Ambulatory Visit (INDEPENDENT_AMBULATORY_CARE_PROVIDER_SITE_OTHER): Payer: 59 | Admitting: Internal Medicine

## 2012-11-02 ENCOUNTER — Encounter: Payer: Self-pay | Admitting: Internal Medicine

## 2012-11-02 ENCOUNTER — Encounter (HOSPITAL_COMMUNITY)
Admission: RE | Admit: 2012-11-02 | Discharge: 2012-11-02 | Disposition: A | Discharge status: Home / Self Care | Payer: 59 | Source: Ambulatory Visit | Attending: Oncology | Admitting: Oncology

## 2012-11-02 VITALS — BP 154/70 | HR 86 | Temp 98.7°F | Wt 140.0 lb

## 2012-11-02 DIAGNOSIS — C20 Malignant neoplasm of rectum: Secondary | ICD-10-CM | POA: Insufficient documentation

## 2012-11-02 DIAGNOSIS — C7800 Secondary malignant neoplasm of unspecified lung: Secondary | ICD-10-CM | POA: Insufficient documentation

## 2012-11-02 DIAGNOSIS — D649 Anemia, unspecified: Secondary | ICD-10-CM | POA: Insufficient documentation

## 2012-11-02 DIAGNOSIS — E119 Type 2 diabetes mellitus without complications: Secondary | ICD-10-CM

## 2012-11-02 DIAGNOSIS — R030 Elevated blood-pressure reading, without diagnosis of hypertension: Secondary | ICD-10-CM

## 2012-11-02 LAB — POCT HEMOGLOBIN: Hemoglobin: 10 g/dL — AB (ref 12.2–16.2)

## 2012-11-02 MED ORDER — FLUDEOXYGLUCOSE F - 18 (FDG) INJECTION
19.1000 | Freq: Once | INTRAVENOUS | Status: AC | PRN
  Administered 2012-11-02: 19.1 via INTRAVENOUS

## 2012-11-02 NOTE — Progress Notes (Signed)
Chief Complaint  Patient presents with  . 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 This occurred today he before she was to come in for her yearly evaluation. She's been diagnosed with adenocarcinoma of the rectum with a lung mass felt to be a secondary metastatic isolated. She had a PET scan today. She is doing well over the last year with exercise weight loss intentional eating correctly and control of her blood sugars usually. It had been up some but to get it down to 105 fasting in the hospital she was on a sliding scale of insulin. In the hospital her blood pressure was elevated it had been in the 1:30 range. She thinks some of the problem was from the automated cuff.  Since the hospitalization she is no active bleeding has had some normal-like bowel movements and no obstructive symptoms. She has met with the oncologist and is to meet with the chest surgeon tomorrow. She is feeling tired but otherwise clinically well.  ROS: See pertinent positives and negatives per HPI.  Past Medical History  Diagnosis Date  . Heart murmur, systolic 07/01/2011  . Postmenopausal HRT (hormone replacement therapy) 07/01/2011    surgical menopause  . Hx of hyperglycemia 07/01/2011  . Hx of compression fracture of spine 1985    t12 after a  20 foot fall  . Hx of fracture of wrist     after fall   . 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 Hx of colonic polyps   . Early cataract     stoneburner   . High frequency hearing loss     kraus followed   . Diabetes mellitus without complication   . Cancer   . Skin cancer     Family History  Problem Relation Age of Onset  . Diabetes Father   . Cancer Father     oral  . Thyroid disease Father   . Heart attack Father     died age 40  . Dementia Mother   . Hypertension Mother   . Hyperlipidemia Mother   . Cancer Brother     retro  lymphosarcoma agent orange  . Cancer Sister     clear cell ovarina and breast insitu  . Obesity      siblings   . Diabetes Brother   . Alcohol abuse Brother     hx of  . Other      arrythmia   . Hypertension Sister   . Asthma Sister     History   Social History  . Marital Status: Widowed    Spouse Name: N/A    Number of Children: N/A  . Years of Education: N/A   Social History Main Topics  . Smoking status: Never Smoker   . Smokeless tobacco: Never Used  . Alcohol Use: 1.0 oz/week    2 drink(s) per week  . Drug Use: No  . Sexually Active: No   Other Topics Concern  . None   Social History Narrative   Divorced  2006 after 1 years marriage    MD developmental Dance movement psychotherapist    Social etoh   NON smoker   exercise 4 x per week  Has personal trainer   G4P2    Outpatient Encounter Prescriptions as of 11/02/2012  Medication Sig Dispense Refill  . Calcium Carbonate Antacid (TUMS E-X PO) Take  2 tablets by mouth daily.      . Calcium Carbonate-Vitamin D (CALTRATE 600+D PO) Take 1 tablet by mouth daily.       . carbamide peroxide (DEBROX) 6.5 % otic solution Place 5 drops into both ears 2 (two) times daily.      Marland Kitchen estradiol (MINIVELLE) 0.0375 MG/24HR Place 1 patch onto the skin 2 (two) times a week. Sundays and Thursdays      . metFORMIN (GLUCOPHAGE) 1000 MG tablet Take 1,000 mg by mouth 2 (two) times daily with a meal.      . METHYLTESTOSTERONE PO Take 1.25 mg by mouth daily.      . Multiple Vitamin (MULTIVITAMIN) tablet Take 1 tablet by mouth daily. Senior      . polyethylene glycol (MIRALAX / GLYCOLAX) packet Take 17 g by mouth daily.  100 each  0  . Probiotic Product (SOLUBLE FIBER/PROBIOTICS PO) Take 1 tablet by mouth daily.      . sitaGLIPtin (JANUVIA) 100 MG tablet Take 100 mg by mouth daily.      . [DISCONTINUED] acetaminophen (TYLENOL) 325 MG tablet Take 650 mg by mouth every 6 (six) hours as needed for pain.      . [DISCONTINUED] simethicone (MYLICON) 125 MG  chewable tablet Chew 125 mg by mouth every 6 (six) hours as needed for flatulence.       No facility-administered encounter medications on file as of 11/02/2012.    EXAM:  BP 154/70  Pulse 86  Temp(Src) 98.7 F (37.1 C) (Oral)  Wt 140 lb (63.504 kg)  BMI 23.67 kg/m2  SpO2 97%  Body mass index is 23.67 kg/(m^2). Repeat blood pressure 154% in the 160/70 laying GENERAL: vitals reviewed and listed above, alert, oriented, appears well hydrated and in no acute distress appears well HEENT: atraumatic, conjunctiva  clear, no obvious abnormalities on inspection of external nose and ears  NECK: no obvious masses on inspection palpation no JVD LUNGS: clear to auscultation bilaterally, no wheezes, rales or rhonchi,  CV: HRRR, no clubbing cyanosis or  peripheral edema nl cap refill soft 1/6 systolic ejection murmur upper left sternal border not radiating MS: moves all extremities without noticeable focal  abnormality PSYCH: pleasant and cooperative, no obvious depression or anxiety Lab Results  Component Value Date   WBC 6.4 10/27/2012   HGB 10.0* 11/02/2012   HCT 30.1* 10/27/2012   PLT 345 10/27/2012   GLUCOSE 145* 10/27/2012   CHOL 111 10/17/2012   TRIG 109.0 10/17/2012   HDL 35.60* 10/17/2012   LDLCALC 54 10/17/2012   ALT 9 10/24/2012   AST 13 10/24/2012   NA 140 10/27/2012   K 4.0 10/27/2012   CL 103 10/27/2012   CREATININE 0.66 10/27/2012   BUN 8 10/27/2012   CO2 27 10/27/2012   TSH 1.81 10/17/2012   INR 1.04 10/24/2012   HGBA1C 7.5* 10/17/2012   MICROALBUR 2.2* 10/17/2012   Wt Readings from Last 3 Encounters:  11/02/12 140 lb (63.504 kg)  11/01/12 140 lb (63.504 kg)  10/27/12 144 lb (65.318 kg)     ASSESSMENT AND PLAN:  Discussed the following assessment and plan:  Anemia - From blood loss rectal cancer. 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 Expectant management continuing  Elevated blood pressure reading - Discussed the ins and  outs monitoring, reported normal before the recent scenario diagnosis Discussed that area for diabetes control may be a little bit laxer and chemotherapy continue healthy lifestyle and needing. She'll bring these things up with the dietitian. Pressure readings coming down in the office but still not at goal discuss getting her monitor at home instead of her home blood pressure cuff is too hard to check urine readings. Patient portal code given to patient so she can access her chart at this time. Put routine diabetes monitoring on hold until she gets visible of her treatment plans. -Patient advised to return or notify health care team  if symptoms worsen or persist or new concerns arise. Copy of pet scan given as well as acess code to My Chart Patient Instructions  Check  Blood pressure   At home at rest.    Get own machine but I think it will come down  See dietician  To help with the  Hemoglobin .     Keep  [place] informed .    Neta Mends. Panosh M.D.  Total visit > 50% spent counseling and coordinating care

## 2012-11-02 NOTE — Patient Instructions (Signed)
Check  Blood pressure   At home at rest.    Get own machine but I think it will come down  See dietician  To help with the  Hemoglobin .     Keep  [place] informed .

## 2012-11-03 ENCOUNTER — Inpatient Hospital Stay: Payer: 59 | Admitting: Oncology

## 2012-11-03 ENCOUNTER — Ambulatory Visit: Payer: 59

## 2012-11-03 ENCOUNTER — Ambulatory Visit (INDEPENDENT_AMBULATORY_CARE_PROVIDER_SITE_OTHER): Payer: Commercial Managed Care - PPO | Admitting: General Surgery

## 2012-11-03 ENCOUNTER — Encounter: Payer: Self-pay | Admitting: *Deleted

## 2012-11-03 ENCOUNTER — Institutional Professional Consult (permissible substitution) (INDEPENDENT_AMBULATORY_CARE_PROVIDER_SITE_OTHER): Payer: 59 | Admitting: Thoracic Surgery (Cardiothoracic Vascular Surgery)

## 2012-11-03 ENCOUNTER — Encounter (HOSPITAL_COMMUNITY): Payer: Self-pay | Admitting: Pharmacy Technician

## 2012-11-03 ENCOUNTER — Encounter: Payer: Self-pay | Admitting: Thoracic Surgery (Cardiothoracic Vascular Surgery)

## 2012-11-03 ENCOUNTER — Other Ambulatory Visit: Payer: 59

## 2012-11-03 ENCOUNTER — Other Ambulatory Visit: Payer: Self-pay | Admitting: *Deleted

## 2012-11-03 ENCOUNTER — Telehealth (INDEPENDENT_AMBULATORY_CARE_PROVIDER_SITE_OTHER): Payer: Self-pay | Admitting: General Surgery

## 2012-11-03 VITALS — BP 164/84 | HR 97 | Resp 20 | Ht 64.5 in | Wt 140.0 lb

## 2012-11-03 DIAGNOSIS — C20 Malignant neoplasm of rectum: Secondary | ICD-10-CM

## 2012-11-03 DIAGNOSIS — R918 Other nonspecific abnormal finding of lung field: Secondary | ICD-10-CM

## 2012-11-03 DIAGNOSIS — C7800 Secondary malignant neoplasm of unspecified lung: Secondary | ICD-10-CM

## 2012-11-03 DIAGNOSIS — R222 Localized swelling, mass and lump, trunk: Secondary | ICD-10-CM

## 2012-11-03 NOTE — Telephone Encounter (Signed)
Anne [name] called said will have port done by thoracic  surgeron on 25 August which she said you were fine with.

## 2012-11-03 NOTE — Progress Notes (Addendum)
PCP is PANOSH,WANDA KOTVAN, MD Referring Provider is Panosh, Wanda K, MD  Chief Complaint  Patient presents with  . 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 On that day she had a large amount of rectal bleeding. She had had a colonoscopy 2 and half years ago which showed 3 small adenomatous polyps and diverticulosis. She thought she probably had a diverticular bleed and went to the emergency room at [facility]. Workup there revealed a rectal mass and a right lower lobe mass. 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 Dr. Sood performed a bronchoscopic biopsy of the lung mass which was positive for metastatic adenocarcinoma.  She has not had any further bleeding since the initial episode. She was feeling well up until the time of the gastrointestinal bleeding. She had lost about 60 pounds over the previous 18-24 months due to diet and exercise. She's not had any unexplained weight loss. She is a lifelong nonsmoker and has no history of asthma or COPD. She denies any cough or wheezing. She has not had any hemoptysis. She does have a history of a heart murmur which was worked up in April 2013. She was found by echocardiography to have normal left ventricular function, mild aortic insufficiency and mild mitral insufficiency.   Past Medical History  Diagnosis Date  . Heart murmur, systolic 07/01/2011  . Postmenopausal HRT (hormone replacement therapy) 07/01/2011    surgical menopause  . Hx of hyperglycemia 07/01/2011  . Hx of compression fracture of spine 1985    t12 after a  20 foot fall  . Hx of fracture of wrist     after fall   . 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 Hx of colonic polyps   . Early cataract     stoneburner   . High frequency hearing loss    [facility] followed   . Diabetes mellitus without complication   . Cancer   . Skin cancer     Past Surgical History  Procedure Laterality Date  . Tonsillectomy  1952  . Adenoidectomy  1952  . Wrist surgery  1957    left reduction  . Compression fraction  1985    T12  . Anterior cruciate ligament repair  1993    right Murphy  . Cosmetic surgery  1999-2007    holderness blepharoplasty facial rhytidectomy mastopexy abdominoplasty   . Dental implant    . Dilation and curettage of uterus  1977  . Total abdominal hysterectomy  2005    with cystocele repair   . Colonoscopy N/A 10/24/2012    Procedure: COLONOSCOPY;  Surgeon: John N Perry, MD;  Location: [facility];  Service: Endoscopy;  Laterality: N/A;  . Colonoscopy N/A 10/25/2012    Procedure: COLONOSCOPY;  Surgeon: John N Perry, MD;  Location: [facility];  Service: Endoscopy;  Laterality: N/A;  . Video bronchoscopy Bilateral 10/27/2012    Procedure: VIDEO BRONCHOSCOPY WITH FLUORO;  Surgeon: Vineet Sood, MD;  Location: [facility];  Service: Endoscopy;  Laterality: Bilateral;    Family History  Problem Relation Age of Onset  . Diabetes Father   . Cancer Father     oral  . Thyroid disease Father   . Heart   attack Father     died age 83  . Dementia Mother   . Hypertension Mother   . Hyperlipidemia Mother   . Cancer Brother     retro lymphosarcoma agent orange  . Cancer Sister     clear cell ovarina and breast insitu  . Obesity      siblings   . Diabetes Brother   . Alcohol abuse Brother     hx of  . Other      arrythmia   . Hypertension Sister   . Asthma Sister     Social History History  Substance Use Topics  . Smoking status: Never Smoker   . Smokeless tobacco: Never Used  . Alcohol Use: 1.0 oz/week    2 drink(s) per week    Current Outpatient Prescriptions  Medication Sig Dispense Refill  . Calcium Carbonate Antacid (TUMS E-X PO) Take 2 tablets by mouth daily.      . Calcium Carbonate-Vitamin D (CALTRATE  600+D PO) Take 1 tablet by mouth daily.       . carbamide peroxide (DEBROX) 6.5 % otic solution Place 5 drops into both ears 2 (two) times daily.      . estradiol (MINIVELLE) 0.0375 MG/24HR Place 1 patch onto the skin 2 (two) times a week. Sundays and Thursdays      . metFORMIN (GLUCOPHAGE) 1000 MG tablet Take 1,000 mg by mouth 2 (two) times daily with a meal.      . METHYLTESTOSTERONE PO Take 1.25 mg by mouth daily.      . Multiple Vitamin (MULTIVITAMIN) tablet Take 1 tablet by mouth daily. Senior      . polyethylene glycol (MIRALAX / GLYCOLAX) packet Take 17 g by mouth daily.  100 each  0  . Probiotic Product (SOLUBLE FIBER/PROBIOTICS PO) Take 1 tablet by mouth daily.      . sitaGLIPtin (JANUVIA) 100 MG tablet Take 100 mg by mouth daily.       No current facility-administered medications for this visit.    Allergies  Allergen Reactions  . Midazolam Other (See Comments)    Caused very crazy feelings, terrors, panic attacks, anxiety  . Zolpidem Tartrate Other (See Comments)    Caused very crazy feelings, terrors, shaking, panic attacks, anxiety  . Neomycin-Bacitracin Zn-Polymyx Itching and Rash    Review of Systems  Constitutional: Positive for fatigue (after GIB, due to anemia). Negative for unexpected weight change (has lost 60 pounds over 2 years due to diet and exercise).  HENT: Positive for hearing loss (mild).   Cardiovascular:       Heart murmur  Gastrointestinal: Positive for blood in stool. Negative for abdominal pain and abdominal distention.  Endocrine:       Hyperglycemia- on PO meds- "pre-diabetes"  Neurological: Negative.   Hematological: Does not bruise/bleed easily.  All other systems reviewed and are negative.    BP 164/84  Pulse 97  Resp 20  Ht 5' 4.5" (1.638 m)  Wt 140 lb (63.504 kg)  BMI 23.67 kg/m2  SpO2 98% Physical Exam  Vitals reviewed. Constitutional: She is oriented to person, place, and time. She appears well-developed and well-nourished. No  distress.  HENT:  Head: Normocephalic and atraumatic.  Eyes: EOM are normal. Pupils are equal, round, and reactive to light.  Neck: Neck supple. No thyromegaly present.  Cardiovascular: Normal rate and regular rhythm.   Murmur (2/6 systolic murmur) heard. Pulmonary/Chest: Effort normal and breath sounds normal. She has no wheezes. She has no rales.  Abdominal:   Soft. There is no tenderness.  Musculoskeletal: She exhibits no edema.  Lymphadenopathy:    She has no cervical adenopathy.  Neurological: She is alert and oriented to person, place, and time. No cranial nerve deficit.  No focal motor deficit  Skin: Skin is warm and dry.     Diagnostic Tests: CT of chest 10/25/2012 *RADIOLOGY REPORT*  Clinical Data: Rectal bleeding, rectal mass at colonoscopy  10/24/2012  CT CHEST, ABDOMEN AND PELVIS WITH CONTRAST  Technique: Multidetector CT imaging of the chest, abdomen and  pelvis was performed following the standard protocol during bolus  administration of intravenous contrast.  Contrast: 100mL OMNIPAQUE IOHEXOL 300 MG/ML SOLN  Comparison: CT abdomen/pelvis without contrast 09/10/2010  CT CHEST  Findings: There is a lobulated irregular mass in the superior  segment right lower lobe measuring 5.0 x 4.6 cm image 41. Rim  calcified right upper lobe 0.8 cm nodule which is centrally lucent  is noted on image 25. 2 and 3 mm left intrapulmonary lymph node  abutting the major fissure are noted images 29 and 31. Central  airways are patent.  Heart size is normal. No lymphadenopathy. No pericardial or  pleural effusion. There is mild osteopenia in the thoracic spine  with multilevel disc degenerative change but no focal lytic or  sclerotic osseous lesion is identified.  IMPRESSION:  Irregular right lower lobe superior segment pulmonary mass which  primary considerations include primary bronchogenic carcinoma or  less likely solitary metastasis.  CT ABDOMEN AND PELVIS  Findings: There is  mass-like rectosigmoid colonic wall thickening  measuring 2.9 cm in thickness image 102. Adjacent pericolonic  stranding and abnormal lymphadenopathy is noted, with dominant  right pericolonic lymph node measuring 0.8 cm image 102. The mass-  like thickening immediately abuts the bladder but no obvious  intravesical invasion is identified allowing for technique. 4 mm  short axis lymph node image 85 at the iliac artery bifurcation is  nonspecific but new since the prior exam as well and suspicious.  Uterus and ovaries are not identified, compatible with prior total  hysterectomy per report. Small bowel and appendix are normal.  Liver, kidneys, adrenal glands, spleen, gallbladder, and pancreas  are normal. Degenerative changes are noted in the spine with areas  of trabecular rarefaction but no focal identifiable osseous lesion.  IMPRESSION:  Mass-like rectosigmoid wall thickening, surrounding stranding, and  adjacent lymphadenopathy, compatible with malignancy with local  nodal spread. Inflammation and local reactive lymph nodes would be  much less likely.  PET CT is recommended for global staging purposes and for further  evaluation of the above described lung mass, as well as surgical  consultation.  These results will be called to the ordering clinician or  representative by the Radiologist Assistant, and communication  documented in the PACS Dashboard.  Original Report Authenticated By: Gretchen Green, M.D.  PET/CT 11/02/2012 *RADIOLOGY REPORT*  Clinical Data: Initial treatment strategy for rectal cancer.  Circumferential rectal cancer 4 cm from the anal verge. 4 cm right  lung mass.  NUCLEAR MEDICINE PET WHOLE BODY  Fasting Blood Glucose: 123  Technique: 19.1 mCi F-18 FDG was injected intravenously. CT data  was obtained and used for attenuation correction and anatomic  localization only. (This was not acquired as a diagnostic CT  examination.) Additional exam technical data  entered on  technologist worksheet.  Comparison: CT scan from 10/25/2012  Findings:  Head/Neck: No hypermetabolic lymph nodes in the neck.  Chest: The previously characterized 5 cm right lower lobe    pulmonary mass is hypermetabolic with SUV max = 8. No evidence for  hypermetabolic lymphadenopathy in the mediastinum or either hilum.  No other hypermetabolic lung lesions are evident although the  patient does have bilateral small pulmonary parenchymal nodules  (see posterior right upper lobe on image 73 and posterior right  lower lobe on image 78). Both of these small nodules are below the  accepted size threshold for reliable resolution on PET imaging.  Abdomen/Pelvis: No abnormal hypermetabolism is identified within  the liver. No hypermetabolic lymphadenopathy in the abdomen.  There is a prominent amount of physiologic F D G uptake in the  colon, but this is associated with hypermetabolic uptake in a  region of circumferential rectal wall thickening. SUV max = 9 for  the rectal hypermetabolism.  Skeleton: No focal hypermetabolic activity to suggest skeletal  metastasis.  IMPRESSION:  Hypermetabolism in the area of apparent circumferential rectal wall  thickening associate with hypermetabolic 5 cm right lower lobe  pulmonary mass.  6 mm cavitary pulmonary nodule the posterior right upper lobe is  not hypermetabolic on PET imaging, but is below the accepted size  threshold for reliable resolution on PET. A second tiny right  lower lobe nodule is also noted.  Original Report Authenticated By: Eric Mansell, M.D.  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 Workup revealed a rectal mass, which turned out to be adenocarcinoma. She also was found to have a 5.0 x 4.6 cm right lower lobe mass and a 6 mm nodule in the right upper lobe. She had a bronchoscopy and biopsy of the right lower lobe mass. This turned out to be  an adenocarcinoma which stained for colorectal markers and not lung markers and is consistent with metastatic disease..  The plan for treatment for her rectal cancer was to proceed with chemotherapy and radiation therapy to be followed by surgery in the future. She now had questions about whether to have a lung mass resected prior to initiation of systemic therapy or whether to wait and have the lung resection later in her course. In my opinion, given that this is a metastatic lesion, she would be best served by starting systemic treatment right away. She also can have local treatment to the primary with radiation. I would hate to delay treatment of the primary and systemic treatment for the 6-8 weeks to take her to get over a thoracotomy and lobectomy.  Given the size of the lesion presently she would require a full thoracotomy and lobectomy for the right lower lobe mass. Of course we would do a wedge resection of the right upper lobe lesion as well at the time of thoracotomy. However if she does have a good response to systemic therapy in the lesion shrinks she might be a candidate for a minimally invasive approach and possibly a small resection such as a wedge or segmentectomy.  Plan: Proceed with chemotherapy and radiation as initial therapy.  I will be happy to see her back to discuss metastectomy when Dr. Kahn feels its the appropriate time.  ADDENDUM  After leaving the office Dr. Cunning called back asking if I would be able to place a Port-A-Cath on Monday, August 25. She had been scheduled to have a Port-A-Cath placed during [place]. She was unable to reschedule for the 25th with [facility] surgery and requests that I place it on that date.  I discussed with her the general nature of   the procedure, using local anesthesia and sedation. She is familiar with the indications, risks, benefits, and alternatives. She understands the risk include bleeding, infection,  and venous thrombosis. She accepts the risk and wishes to proceed. We will schedule her for Monday August 25th 

## 2012-11-07 ENCOUNTER — Other Ambulatory Visit: Payer: Self-pay | Admitting: Oncology

## 2012-11-07 ENCOUNTER — Telehealth: Payer: Self-pay | Admitting: Family Medicine

## 2012-11-07 ENCOUNTER — Telehealth: Payer: Self-pay | Admitting: *Deleted

## 2012-11-07 ENCOUNTER — Other Ambulatory Visit: Payer: Self-pay | Admitting: Family Medicine

## 2012-11-07 DIAGNOSIS — C20 Malignant neoplasm of rectum: Secondary | ICD-10-CM

## 2012-11-07 MED ORDER — LORAZEPAM 0.5 MG PO TABS: 0.5000 mg | ORAL_TABLET | Freq: Four times a day (QID) | ORAL | Status: DC | PRN

## 2012-11-07 MED ORDER — LIDOCAINE-PRILOCAINE 2.5-2.5 % EX CREA: TOPICAL_CREAM | CUTANEOUS | Status: DC | PRN

## 2012-11-07 MED ORDER — PROCHLORPERAZINE MALEATE 10 MG PO TABS: 10.0000 mg | ORAL_TABLET | Freq: Four times a day (QID) | ORAL | Status: DC | PRN

## 2012-11-07 MED ORDER — DEXAMETHASONE 4 MG PO TABS: 8.0000 mg | ORAL_TABLET | Freq: Two times a day (BID) | ORAL | Status: DC

## 2012-11-07 MED ORDER — SITAGLIPTIN PHOSPHATE 100 MG PO TABS: ORAL_TABLET | ORAL | Status: DC

## 2012-11-07 MED ORDER — ONDANSETRON HCL 8 MG PO TABS: 8.0000 mg | ORAL_TABLET | Freq: Two times a day (BID) | ORAL | Status: DC

## 2012-11-07 NOTE — Telephone Encounter (Signed)
Yes she will get chemo on 8/26 [facility]

## 2012-11-07 NOTE — Telephone Encounter (Signed)
Per staff message and POF I tried scheduled appts. There is no start date or orders, desk RN notified and will call me back.  [name]

## 2012-11-07 NOTE — Telephone Encounter (Signed)
Pt's daughter called states " I need to confirm  Mom's appt for her first 1st chemo treatment." Pt currently scheduled for 08/22 [facility] and 8/26 lab/MD. Will review with MD if pt is to start chemo on 08/26. No chemo class has been scheduled at this time.

## 2012-11-07 NOTE — Telephone Encounter (Signed)
Message forwarded to scheduler for pt to receive chemo on 8/26 per MD. Pt to be notified by scheduling with appt date/time

## 2012-11-07 NOTE — Telephone Encounter (Signed)
Patient returned my phone call.  She is still taking Januvia.  Refills sent to the pharmacy by e-scribe.

## 2012-11-07 NOTE — Telephone Encounter (Signed)
When pt was last in she stated she had stopped the Januvia.  Received a fax from [facility] requesting refills.  Tried reaching the patient to ask if this was automated or if she is starting back on the medication.  Left a message at home and on her cell [phone]).

## 2012-11-08 ENCOUNTER — Telehealth: Payer: Self-pay | Admitting: *Deleted

## 2012-11-08 ENCOUNTER — Telehealth: Payer: Self-pay | Admitting: Internal Medicine

## 2012-11-08 NOTE — Telephone Encounter (Signed)
Pts daughter states they are thinking about going out of town and she and her mother would like to know what she could expect as far as passing any tissue, bleeding, and bowel movements. Discussed with daughter that Dr. Marina Goodell is not in the office and will not return until Monday. Suggested they try calling [facility] and Dr. Donell Beers. She verbalized understanding.

## 2012-11-08 NOTE — Telephone Encounter (Signed)
Called Claris Che- pt's daughter informed her of 1st treatment scheduled on 8/26. [name] asked if pt was able to travel to [place] the day after her 1st treatment to meet her new grandson ans spend some time there visiting.  Discussed I will review with MD and call her back. No further concerns at this time. 11/22/12 lab/md/chemo -[facility]OL[facility]

## 2012-11-08 NOTE — Telephone Encounter (Signed)
Left message to call back  

## 2012-11-12 ENCOUNTER — Other Ambulatory Visit: Payer: Self-pay | Admitting: Internal Medicine

## 2012-11-14 ENCOUNTER — Telehealth: Payer: Self-pay | Admitting: Medical Oncology

## 2012-11-14 NOTE — Telephone Encounter (Signed)
Pt called with several questions regarding next weeks tx  1) requesting Zofran dots (ODT) instead of pill.  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 In a tx section with windows. She was told she needs an order from MD for this request.  3) Asking if it is possible for her to go to [place] with daughter and family August 28th?  Regarding the trip to [place], advised pt it is difficult to say, not knowing how she would feel after home infusion is complete and pump is d/c'd that day. Also informed pt that MD is out of office for the week.This is pt's first chemotherapy tx. Informed pt will review with NP/MD and call her back.  Patient expressed verbal understanding, knows to call office with questions or concerns.  Sched appt 08/26 lab/MD/tx.  Mssg forwarded to MD/NP for review.

## 2012-11-15 ENCOUNTER — Ambulatory Visit: Admit: 2012-11-15 | Payer: 59 | Admitting: General Surgery

## 2012-11-15 SURGERY — INSERTION, TUNNELED CENTRAL VENOUS DEVICE, WITH PORT
Anesthesia: Monitor Anesthesia Care

## 2012-11-16 MED ORDER — ONDANSETRON 8 MG PO TBDP: 8.0000 mg | ORAL_TABLET | Freq: Three times a day (TID) | ORAL | Status: DC | PRN

## 2012-11-16 NOTE — Telephone Encounter (Signed)
First two of these requests are ok.  Zofran odt 8mg  q8hprn disp 30 2 refills.  Will let her know about [place] on 8/26.

## 2012-11-16 NOTE — Telephone Encounter (Signed)
Per NP, informed pt that Zofran ODT prescription has been sent to her pharmacy. As well as working on to have her seated in treatment room next to window. Per NP, trip to [place] will be need to be addressed during her appt with Dr Welton Flakes 08/26. Patient expressed verbal understanding and thanks. Knows to call office with any questions or concerns.

## 2012-11-17 ENCOUNTER — Encounter (HOSPITAL_COMMUNITY): Payer: Self-pay | Admitting: Pharmacy Technician

## 2012-11-18 ENCOUNTER — Encounter (HOSPITAL_COMMUNITY): Payer: Self-pay

## 2012-11-18 ENCOUNTER — Ambulatory Visit (HOSPITAL_COMMUNITY)
Admission: RE | Admit: 2012-11-18 | Discharge: 2012-11-18 | Disposition: A | Discharge status: Home / Self Care | Payer: 59 | Source: Ambulatory Visit | Attending: Thoracic Surgery (Cardiothoracic Vascular Surgery) | Admitting: Thoracic Surgery (Cardiothoracic Vascular Surgery)

## 2012-11-18 ENCOUNTER — Encounter (HOSPITAL_COMMUNITY)
Admission: RE | Admit: 2012-11-18 | Discharge: 2012-11-18 | Disposition: A | Discharge status: Home / Self Care | Payer: 59 | Source: Ambulatory Visit | Attending: Thoracic Surgery (Cardiothoracic Vascular Surgery) | Admitting: Thoracic Surgery (Cardiothoracic Vascular Surgery)

## 2012-11-18 VITALS — BP 110/58 | HR 87 | Temp 98.0°F | Resp 20 | Ht 64.5 in | Wt 138.5 lb

## 2012-11-18 DIAGNOSIS — C189 Malignant neoplasm of colon, unspecified: Secondary | ICD-10-CM | POA: Insufficient documentation

## 2012-11-18 DIAGNOSIS — R918 Other nonspecific abnormal finding of lung field: Secondary | ICD-10-CM

## 2012-11-18 DIAGNOSIS — Z01818 Encounter for other preprocedural examination: Secondary | ICD-10-CM | POA: Insufficient documentation

## 2012-11-18 DIAGNOSIS — Z01812 Encounter for preprocedural laboratory examination: Secondary | ICD-10-CM | POA: Insufficient documentation

## 2012-11-18 DIAGNOSIS — J984 Other disorders of lung: Secondary | ICD-10-CM | POA: Insufficient documentation

## 2012-11-18 HISTORY — DX: Adverse effect of unspecified anesthetic, initial encounter: T41.45XA

## 2012-11-18 HISTORY — DX: Other complications of anesthesia, initial encounter: T88.59XA

## 2012-11-18 LAB — CBC
Hemoglobin: 10.3 g/dL — ABNORMAL LOW (ref 12.0–15.0)
MCH: 26.1 pg (ref 26.0–34.0)
Platelets: 425 10*3/uL — ABNORMAL HIGH (ref 150–400)
RBC: 3.94 MIL/uL (ref 3.87–5.11)
WBC: 8.8 10*3/uL (ref 4.0–10.5)

## 2012-11-18 LAB — COMPREHENSIVE METABOLIC PANEL
ALT: 15 U/L (ref 0–35)
AST: 15 U/L (ref 0–37)
Alkaline Phosphatase: 56 U/L (ref 39–117)
CO2: 28 mEq/L (ref 19–32)
Calcium: 10.3 mg/dL (ref 8.4–10.5)
Chloride: 99 mEq/L (ref 96–112)
GFR calc Af Amer: >90 mL/min (ref >90)
GFR calc non Af Amer: >90 mL/min (ref >90)
Glucose, Bld: 145 mg/dL — ABNORMAL HIGH (ref 70–99)
Potassium: 4 mEq/L (ref 3.5–5.1)
Sodium: 137 mEq/L (ref 135–145)

## 2012-11-18 LAB — APTT: aPTT: 28 seconds (ref 24–37)

## 2012-11-18 NOTE — Pre-Procedure Instructions (Signed)
Anne [name]  11/18/2012   Your procedure is scheduled on:  Monday, August 25th  Report to [facility] at 0530 AM.  Call this number if you have problems the morning of surgery: [phone]   Remember:   Do not eat food or drink liquids after midnight.   Take these medicines the morning of surgery with A SIP OF WATER: ativan if needed  Do NOT take any diabetes medication the morning of surgery.   Do not wear jewelry, make-up or nail polish.  Do not wear lotions, powders, or perfumes. You may wear deodorant.  Do not shave 48 hours prior to surgery. Men may shave face and neck.  Do not bring valuables to the hospital.  [facility] is not responsible for any belongings or valuables.  Contacts, dentures or bridgework may not be worn into surgery.  Leave suitcase in the car. After surgery it may be brought to your room.  For patients admitted to the hospital, checkout time is 11:00 AM the day of discharge.   Patients discharged the day of surgery will not be allowed to drive home.    Special Instructions: Shower using CHG 2 nights before surgery and the night before surgery.  If you shower the day of surgery use CHG.  Use special wash - you have one bottle of CHG for all showers.  You should use approximately 1/3 of the bottle for each shower.   Please read over the following fact sheets that you were given: Pain Booklet, Coughing and Deep Breathing and Surgical Site Infection Prevention

## 2012-11-18 NOTE — Progress Notes (Signed)
Anesthesia to review EKG on DOS.

## 2012-11-20 MED ORDER — CHLORHEXIDINE GLUCONATE 4 % EX LIQD: 1.0000 "application " | Freq: Once | CUTANEOUS | Status: DC

## 2012-11-20 MED ORDER — CEFAZOLIN SODIUM-DEXTROSE 2-3 GM-% IV SOLR
2.0000 g | INTRAVENOUS | Status: AC
  Administered 2012-11-21: 1.5 g via INTRAVENOUS
  Filled 2012-11-20: qty 50

## 2012-11-20 MED ORDER — DEXTROSE 5 % IV SOLN
1.5000 g | INTRAVENOUS | Status: DC
  Filled 2012-11-20: qty 1.5

## 2012-11-20 NOTE — Progress Notes (Signed)
[facility]  Telephone:([phone] [phone]   MEDICAL ONCOLOGY - INITIAL CONSULATION    Referral [name] Dr. Berniece Andreas  Reason for Referral: 67 year old female with new diagnosis of rectal cancer with lung metastasis.  Chief Complaint  Patient presents with  . 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 Of note,colonoscopy prior to this admission had been performed on 03/04/10(screening) which showed three polyps ,largest 8 mm, removed, as well as moderate diverticulosis in the sigmoid colon, otherwise normal exam . On presentation, she had Positive Guaiac and a rectal mass had been palpated on exam. On 7/29 she underwent colonoscopy (Dr. Marina Goodell) which revealed a bulky, circumferential, friable mass lesion in the rectum of about 4 cm above the dentate line, subtotally obstructing and would not permit passage of the colonoscope proximally. Multiple biopsies were taken. Pathology report pending.  CT of the chest, abdomen and pelvis on 10/25/2012 demonstrated a lobulated irregular mass in the superior segment right lower lobe measuring 5.0 x 4.6 cm image 41, a rim calcified right upper lobe 0.8 cm nodule, centrally lucent , a 2 and 3 mm left intrapulmonary lymph node abutting the major fissure ,no lymphadenopathy, pericardial or pleural effusion. No focal lytic or sclerotic osseous lesion is identified. In the abdomen,there is mass- like rectosigmoid colonic wall thickening measuring 2.9 cm with adjacent pericolonic stranding and abnormal lymphadenopathy, with dominant right pericolonic lymph node measuring 0.8 cm The mass- like thickening immediately abuts the bladder but no obvious intravesical invasion  is identified allowing for technique. 4 mm lymph node at the iliac artery bifurcation is nonspecific but new since the prior exam in 2012 as well and suspicious. Small bowel,appendix,liver, kidneys, adrenal glands, spleen, gallbladder, and pancreas are normal. No focal identifiable osseous lesion.  She had bronchoscopy on 10/27/2012 for evaluation of the RLL mass. A biopsy was performed of the lung mass and 9 is consistent with an adenocarcinoma consistent with metastasis    Past Medical History  Diagnosis Date  . Heart murmur, systolic 07/01/2011  . Postmenopausal HRT (hormone replacement therapy) 07/01/2011    surgical menopause  . Hx of hyperglycemia 07/01/2011  . Hx of compression fracture of spine 1985    t12 after a  20 foot fall  . Hx of fracture of wrist     after fall   . 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 Hx of colonic polyps   . Early cataract     [place]burner   . High frequency hearing loss     [facility] followed   . Diabetes mellitus without complication   . Cancer     colon  . Skin cancer   . Complication of anesthesia     pt hadpanic attacks with versed, no versed  :  Past Surgical History  Procedure Laterality Date  . Tonsillectomy  1952  . Adenoidectomy  1952  . Wrist surgery  1957    left reduction  . Compression fraction  1985    T12  . Anterior cruciate ligament repair  1993    right Murphy  . Cosmetic surgery  1999-2007    holderness blepharoplasty facial rhytidectomy mastopexy abdominoplasty   . Dental implant    .  Dilation and curettage of uterus  1977  . Total abdominal hysterectomy  2005    with cystocele repair   . Colonoscopy N/A 10/24/2012    Procedure: COLONOSCOPY;  Surgeon: Hilarie Fredrickson, [name];  Location: [facility];  Service: Endoscopy;  Laterality: N/A;  . Colonoscopy N/A 10/25/2012    Procedure: COLONOSCOPY;  Surgeon: Hilarie Fredrickson, Anne [name];  Location: [facility] ENDOSCOPY;  Service: Endoscopy;  Laterality: N/A;  . Video bronchoscopy Bilateral 10/27/2012      Procedure: VIDEO BRONCHOSCOPY WITH FLUORO;  Surgeon: Coralyn Helling, Anne [name];  Location: [facility] ENDOSCOPY;  Service: Endoscopy;  Laterality: Bilateral;  :  Current Outpatient Prescriptions  Medication Sig Dispense Refill  . Calcium Carbonate-Vitamin D (CALTRATE 600+D PO) Take 1 tablet by mouth daily.       . carbamide peroxide (DEBROX) 6.5 % otic solution Place 5 drops into both ears 2 (two) times daily as needed. Ear wax.      . estradiol (MINIVELLE) 0.0375 MG/24HR Place 1 patch onto the skin 2 (two) times a week. Sundays and Thursdays      . metFORMIN (GLUCOPHAGE) 1000 MG tablet Take 1,000 mg by mouth 2 (two) times daily with a meal.      . METHYLTESTOSTERONE PO Take 1.25 mg by mouth daily.      . Multiple Vitamin (MULTIVITAMIN) tablet Take 1 tablet by mouth daily. Senior      . polyethylene glycol (MIRALAX / GLYCOLAX) packet Take 17 g by mouth daily.  100 each  0  . Probiotic Product (SOLUBLE FIBER/PROBIOTICS PO) Take 1 tablet by mouth daily.      . sitaGLIPtin (JANUVIA) 100 MG tablet Take 100 mg by mouth daily.      . Calcium Carbonate Antacid (TUMS E-X PO) Take 2 tablets by mouth daily.      Marland Kitchen dexamethasone (DECADRON) 4 MG tablet Take 2 tablets (8 mg total) by mouth 2 (two) times daily with a meal. Take daily starting the day after chemotherapy for 2 days. Take with food.  30 tablet  1  . lidocaine-prilocaine (EMLA) cream Apply topically as needed.  30 g  5  . LORazepam (ATIVAN) 0.5 MG tablet Take 1 tablet (0.5 mg total) by mouth every 6 (six) hours as needed (Nausea or vomiting).  30 tablet  0  . ondansetron (ZOFRAN ODT) 8 MG disintegrating tablet Take 1 tablet (8 mg total) by mouth every 8 (eight) hours as needed for nausea.  30 tablet  2   No current facility-administered medications for this visit.   Facility-Administered Medications Ordered in Other Visits  Medication Dose Route Frequency Provider Last Rate Last Dose  . [START ON 11/21/2012] ceFAZolin (ANCEF) IVPB 2 g/50 mL premix  2 g  Intravenous On Call to OR Almond Lint, Anne [name]      . cefUROXime (ZINACEF) 1.5 g in dextrose 5 % 50 mL IVPB  1.5 g Intravenous 60 min Pre-Op Loreli Slot, Anne [name]      . chlorhexidine (HIBICLENS) 4 % liquid 1 application  1 application Topical Once Almond Lint, Anne [name]         Allergies  Allergen Reactions  . Midazolam Other (See Comments)    Caused very crazy feelings, terrors, panic attacks, anxiety  . Zolpidem Tartrate Other (See Comments)    Caused very crazy feelings, terrors, shaking, panic attacks, anxiety  . Neomycin-Bacitracin Zn-Polymyx Itching and Rash  :  Family History  Problem Relation Age of Onset  . Diabetes Father   . Cancer Father  oral  . Thyroid disease Father   . Heart attack Father     died age 62  . Dementia Mother   . Hypertension Mother   . Hyperlipidemia Mother   . Cancer Brother     retro lymphosarcoma agent orange  . Cancer Sister     clear cell ovarina and breast insitu  . Obesity      siblings   . Diabetes Brother   . Alcohol abuse Brother     hx of  . Other      arrythmia   . Hypertension Sister   . Asthma Sister   :  History   Social History  . Marital Status: Widowed    Spouse Name: N/A    Number of Children: 2  . Years of Education: N/A   Occupational History  . PHYSICIAN    Social History Main Topics  . Smoking status: Never Smoker   . Smokeless tobacco: Never Used  . Alcohol Use: 1.0 oz/week    2 drink(s) per week     Comment: occ  . Drug Use: No  . Sexual Activity: No   Other Topics Concern  . 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  Exam: @IPVITALS @  General:  well-nourished in no acute distress.  Eyes:  no scleral icterus.  ENT:  There were no oropharyngeal lesions.  Neck was without  thyromegaly.  Lymphatics:  Negative cervical, supraclavicular or axillary adenopathy.  Respiratory: lungs were clear bilaterally without wheezing or crackles.  Cardiovascular:  Regular rate and rhythm, S1/S2, without murmur, rub or gallop.  There was no pedal edema.  GI:  abdomen was soft, flat, nontender, nondistended, without organomegaly.  Muscoloskeletal:  no spinal tenderness of palpation of vertebral spine.  Skin exam was without echymosis, petichae.  Neuro exam was nonfocal.  Patient was able to get on and off exam table without assistance.  Gait was normal.  Patient was alerted and oriented.  Attention was good.   Language was appropriate.  Mood was normal without depression.  Speech was not pressured.  Thought content was not tangential.     Lab Results  Component Value Date   WBC 8.8 11/18/2012   HGB 10.3* 11/18/2012   HCT 30.7* 11/18/2012   PLT 425* 11/18/2012   GLUCOSE 145* 11/18/2012   CHOL 111 10/17/2012   TRIG 109.0 10/17/2012   HDL 35.60* 10/17/2012   LDLCALC 54 10/17/2012   ALT 15 11/18/2012   AST 15 11/18/2012   NA 137 11/18/2012   K 4.0 11/18/2012   CL 99 11/18/2012   CREATININE 0.61 11/18/2012   BUN 11 11/18/2012   CO2 28 11/18/2012   TSH 1.81 10/17/2012   INR 1.00 11/18/2012   HGBA1C 7.5* 10/17/2012   MICROALBUR 2.2* 10/17/2012    Dg Chest 2 View Within Previous 72 Hours.  Films Obtained On Friday Are Acceptable For Monday And Tuesday Cases  11/18/2012   CLINICAL DATA:  67 year old female preoperative study. Lung mass. Colon cancer.  EXAM: CHEST  2 VIEW  COMPARISON:  PET-CT 11/02/2012 and earlier.  FINDINGS: 5 cm right lower lobe lung mass re- identified. Radiographically this appears not significantly changed since 10/25/2012. The 6 mm cavitary right  upper lobe pulmonary nodule is faint but visible. Stable lung volumes. Stable cardiac size and mediastinal contours. The left lung is clear. No pneumothorax or effusion. Stable visualized osseous structures. Chronic T12 compression  fracture. Flowing thoracic osteophytes.  IMPRESSION: Known right lung lesions. No new cardiopulmonary abnormality.   Electronically Signed   By: Augusto Gamble   On: 11/18/2012 15:53   Ct Chest W Contrast  10/25/2012   *RADIOLOGY REPORT*  Clinical Data:  Rectal bleeding, rectal mass at colonoscopy 10/24/2012  CT CHEST, ABDOMEN AND PELVIS WITH CONTRAST  Technique:  Multidetector CT imaging of the chest, abdomen and pelvis was performed following the standard protocol during bolus administration of intravenous contrast.  Contrast: OMNIPAQUE IOHEXOL 300 MG/ML  SOLN  Comparison:  CT abdomen/pelvis without contrast 09/10/2010  CT CHEST  Findings:  There is a lobulated irregular mass in the superior segment right lower lobe measuring 5.0 x 4.6 cm image 41.  Rim calcified right upper lobe 0.8 cm nodule which is centrally lucent is noted on image 25.  2 and 3 mm left intrapulmonary lymph node abutting the major fissure are noted images 29 and 31.  Central airways are patent.  Heart size is normal.  No lymphadenopathy.  No pericardial or pleural effusion.  There is mild osteopenia in the thoracic spine with multilevel disc degenerative change but no focal lytic or sclerotic osseous lesion is identified.  IMPRESSION: Irregular right lower lobe superior segment pulmonary mass which primary considerations include primary bronchogenic carcinoma or less likely solitary metastasis.  CT ABDOMEN AND PELVIS  Findings:  There is mass-like rectosigmoid colonic wall thickening measuring 2.9 cm in thickness image 102.  Adjacent pericolonic stranding and abnormal lymphadenopathy is noted, with dominant right pericolonic lymph node measuring 0.8 cm image 102.  The mass- like thickening immediately abuts the bladder but no obvious intravesical invasion is identified allowing for technique.  4 mm short axis lymph node image 85 at the iliac artery bifurcation is nonspecific but new since the prior exam as well and suspicious. Uterus and  ovaries are not identified, compatible with prior total hysterectomy per report.  Small bowel and appendix are normal.  Liver, kidneys, adrenal glands, spleen, gallbladder, and pancreas are normal.  Degenerative changes are noted in the spine with areas of trabecular rarefaction but no focal identifiable osseous lesion.  IMPRESSION: Mass-like rectosigmoid wall thickening, surrounding stranding, and adjacent lymphadenopathy, compatible with malignancy with local nodal spread.  Inflammation and local reactive lymph nodes would be much less likely.  PET CT is recommended for global staging purposes and for further evaluation of the above described lung mass, as well as surgical consultation.  These results will be called to the ordering clinician or representative by the Radiologist Assistant, and communication documented in the PACS Dashboard.   Original Report Authenticated By: Christiana Pellant, M.D.   Ct Abdomen Pelvis W Contrast  10/25/2012   *RADIOLOGY REPORT*  Clinical Data:  Rectal bleeding, rectal mass at colonoscopy 10/24/2012  CT CHEST, ABDOMEN AND PELVIS WITH CONTRAST  Technique:  Multidetector CT imaging of the chest, abdomen and pelvis was performed following the standard protocol during bolus administration of intravenous contrast.  Contrast: OMNIPAQUE IOHEXOL 300 MG/ML  SOLN  Comparison:  CT abdomen/pelvis without contrast 09/10/2010  CT CHEST  Findings:  There is a lobulated irregular mass in the superior segment right lower lobe measuring 5.0 x 4.6 cm image 41.  Rim calcified right upper lobe 0.8 cm nodule which is centrally lucent is  noted on image 25.  2 and 3 mm left intrapulmonary lymph node abutting the major fissure are noted images 29 and 31.  Central airways are patent.  Heart size is normal.  No lymphadenopathy.  No pericardial or pleural effusion.  There is mild osteopenia in the thoracic spine with multilevel disc degenerative change but no focal lytic or sclerotic osseous lesion is  identified.  IMPRESSION: Irregular right lower lobe superior segment pulmonary mass which primary considerations include primary bronchogenic carcinoma or less likely solitary metastasis.  CT ABDOMEN AND PELVIS  Findings:  There is mass-like rectosigmoid colonic wall thickening measuring 2.9 cm in thickness image 102.  Adjacent pericolonic stranding and abnormal lymphadenopathy is noted, with dominant right pericolonic lymph node measuring 0.8 cm image 102.  The mass- like thickening immediately abuts the bladder but no obvious intravesical invasion is identified allowing for technique.  4 mm short axis lymph node image 85 at the iliac artery bifurcation is nonspecific but new since the prior exam as well and suspicious. Uterus and ovaries are not identified, compatible with prior total hysterectomy per report.  Small bowel and appendix are normal.  Liver, kidneys, adrenal glands, spleen, gallbladder, and pancreas are normal.  Degenerative changes are noted in the spine with areas of trabecular rarefaction but no focal identifiable osseous lesion.  IMPRESSION: Mass-like rectosigmoid wall thickening, surrounding stranding, and adjacent lymphadenopathy, compatible with malignancy with local nodal spread.  Inflammation and local reactive lymph nodes would be much less likely.  PET CT is recommended for global staging purposes and for further evaluation of the above described lung mass, as well as surgical consultation.  These results will be called to the ordering clinician or representative by the Radiologist Assistant, and communication documented in the PACS Dashboard.   Original Report Authenticated By: Christiana Pellant, M.D.   Nm Pet Image Initial (pi) Skull Base To Thigh  11/02/2012   *RADIOLOGY REPORT*  Clinical Data:  Initial treatment strategy for rectal cancer. Circumferential rectal cancer 4 cm from the anal verge.  4 cm right lung mass.  NUCLEAR MEDICINE PET WHOLE BODY  Fasting Blood Glucose:  123   Technique:  19.1 mCi F-18 FDG was injected intravenously. CT data was obtained and used for attenuation correction and anatomic localization only.  (This was not acquired as a diagnostic CT examination.) Additional exam technical data entered on technologist worksheet.  Comparison:  CT scan from 10/25/2012  Findings:  Head/Neck:   No hypermetabolic lymph nodes in the neck.  Chest:  The previously characterized 5 cm right lower lobe pulmonary mass is hypermetabolic with SUV max = 8.  No evidence for hypermetabolic lymphadenopathy in the mediastinum or either hilum. No other hypermetabolic lung lesions are evident although the patient does have bilateral small pulmonary parenchymal nodules (see posterior right upper lobe on image 73 and posterior right lower lobe on image 78).  Both of these small nodules are below the accepted size threshold for reliable resolution on PET imaging.  Abdomen/Pelvis:  No abnormal hypermetabolism is identified within the liver.  No hypermetabolic lymphadenopathy in the abdomen.  There is a prominent amount of physiologic F D G uptake in the colon, but this is associated with hypermetabolic uptake in a region of circumferential rectal wall thickening.  SUV max = 9 for the rectal hypermetabolism.  Skeleton:  No focal hypermetabolic activity to suggest skeletal metastasis.  IMPRESSION: Hypermetabolism in the area of apparent circumferential rectal wall thickening associate with hypermetabolic 5 cm right lower lobe pulmonary  mass.  6 mm cavitary pulmonary nodule the posterior right upper lobe is not hypermetabolic on PET imaging, but is below the accepted size threshold for reliable resolution on PET.  A [name] tiny right lower lobe nodule is also noted.   Original Report Authenticated By: Kennith Center, M.D.   Dg Chest Port 1 View  10/27/2012   *RADIOLOGY REPORT*  Clinical Data: Post bronchoscopy  PORTABLE CHEST - 1 VIEW  Comparison: 10/25/2012  Findings: Cardiomediastinal silhouette is  stable.  Again noted nodular mass in the right lower lobe measures about 5.7 cm.  Mild thoracic dextroscoliosis.  No acute infiltrate or pulmonary edema. There is no diagnostic pneumothorax.  IMPRESSION: No active disease.  Mild thoracic dextroscoliosis.  Again noted nodular mass in right lower lobe measures 5.7 cm.  No diagnostic pneumothorax.   Original Report Authenticated By: Natasha Mead, M.D.   Dg C-arm Bronchoscopy  10/27/2012   CLINICAL DATA: lung mass   C-ARM BRONCHOSCOPY  Fluoroscopy was utilized by the requesting physician.  No radiographic  interpretation.     Pathology:  Dg Chest 2 View Within Previous 72 Hours.  Films Obtained On Friday Are Acceptable For Monday And Tuesday Cases  11/18/2012   CLINICAL DATA:  67 year old female preoperative study. Lung mass. Colon cancer.  EXAM: CHEST  2 VIEW  COMPARISON:  PET-CT 11/02/2012 and earlier.  FINDINGS: 5 cm right lower lobe lung mass re- identified. Radiographically this appears not significantly changed since 10/25/2012. The 6 mm cavitary right upper lobe pulmonary nodule is faint but visible. Stable lung volumes. Stable cardiac size and mediastinal contours. The left lung is clear. No pneumothorax or effusion. Stable visualized osseous structures. Chronic T12 compression fracture. Flowing thoracic osteophytes.  IMPRESSION: Known right lung lesions. No new cardiopulmonary abnormality.   Electronically Signed   By: Augusto Gamble   On: 11/18/2012 15:53   Ct Chest W Contrast  10/25/2012   *RADIOLOGY REPORT*  Clinical Data:  Rectal bleeding, rectal mass at colonoscopy 10/24/2012  CT CHEST, ABDOMEN AND PELVIS WITH CONTRAST  Technique:  Multidetector CT imaging of the chest, abdomen and pelvis was performed following the standard protocol during bolus administration of intravenous contrast.  Contrast: OMNIPAQUE IOHEXOL 300 MG/ML  SOLN  Comparison:  CT abdomen/pelvis without contrast 09/10/2010  CT CHEST  Findings:  There is a lobulated irregular mass  in the superior segment right lower lobe measuring 5.0 x 4.6 cm image 41.  Rim calcified right upper lobe 0.8 cm nodule which is centrally lucent is noted on image 25.  2 and 3 mm left intrapulmonary lymph node abutting the major fissure are noted images 29 and 31.  Central airways are patent.  Heart size is normal.  No lymphadenopathy.  No pericardial or pleural effusion.  There is mild osteopenia in the thoracic spine with multilevel disc degenerative change but no focal lytic or sclerotic osseous lesion is identified.  IMPRESSION: Irregular right lower lobe superior segment pulmonary mass which primary considerations include primary bronchogenic carcinoma or less likely solitary metastasis.  CT ABDOMEN AND PELVIS  Findings:  There is mass-like rectosigmoid colonic wall thickening measuring 2.9 cm in thickness image 102.  Adjacent pericolonic stranding and abnormal lymphadenopathy is noted, with dominant right pericolonic lymph node measuring 0.8 cm image 102.  The mass- like thickening immediately abuts the bladder but no obvious intravesical invasion is identified allowing for technique.  4 mm short axis lymph node image 85 at the iliac artery bifurcation is nonspecific but new since the  prior exam as well and suspicious. Uterus and ovaries are not identified, compatible with prior total hysterectomy per report.  Small bowel and appendix are normal.  Liver, kidneys, adrenal glands, spleen, gallbladder, and pancreas are normal.  Degenerative changes are noted in the spine with areas of trabecular rarefaction but no focal identifiable osseous lesion.  IMPRESSION: Mass-like rectosigmoid wall thickening, surrounding stranding, and adjacent lymphadenopathy, compatible with malignancy with local nodal spread.  Inflammation and local reactive lymph nodes would be much less likely.  PET CT is recommended for global staging purposes and for further evaluation of the above described lung mass, as well as surgical  consultation.  These results will be called to the ordering clinician or representative by the Radiologist Assistant, and communication documented in the PACS Dashboard.   Original Report Authenticated By: Christiana Pellant, M.D.   Ct Abdomen Pelvis W Contrast  10/25/2012   *RADIOLOGY REPORT*  Clinical Data:  Rectal bleeding, rectal mass at colonoscopy 10/24/2012  CT CHEST, ABDOMEN AND PELVIS WITH CONTRAST  Technique:  Multidetector CT imaging of the chest, abdomen and pelvis was performed following the standard protocol during bolus administration of intravenous contrast.  Contrast: OMNIPAQUE IOHEXOL 300 MG/ML  SOLN  Comparison:  CT abdomen/pelvis without contrast 09/10/2010  CT CHEST  Findings:  There is a lobulated irregular mass in the superior segment right lower lobe measuring 5.0 x 4.6 cm image 41.  Rim calcified right upper lobe 0.8 cm nodule which is centrally lucent is noted on image 25.  2 and 3 mm left intrapulmonary lymph node abutting the major fissure are noted images 29 and 31.  Central airways are patent.  Heart size is normal.  No lymphadenopathy.  No pericardial or pleural effusion.  There is mild osteopenia in the thoracic spine with multilevel disc degenerative change but no focal lytic or sclerotic osseous lesion is identified.  IMPRESSION: Irregular right lower lobe superior segment pulmonary mass which primary considerations include primary bronchogenic carcinoma or less likely solitary metastasis.  CT ABDOMEN AND PELVIS  Findings:  There is mass-like rectosigmoid colonic wall thickening measuring 2.9 cm in thickness image 102.  Adjacent pericolonic stranding and abnormal lymphadenopathy is noted, with dominant right pericolonic lymph node measuring 0.8 cm image 102.  The mass- like thickening immediately abuts the bladder but no obvious intravesical invasion is identified allowing for technique.  4 mm short axis lymph node image 85 at the iliac artery bifurcation is nonspecific but new  since the prior exam as well and suspicious. Uterus and ovaries are not identified, compatible with prior total hysterectomy per report.  Small bowel and appendix are normal.  Liver, kidneys, adrenal glands, spleen, gallbladder, and pancreas are normal.  Degenerative changes are noted in the spine with areas of trabecular rarefaction but no focal identifiable osseous lesion.  IMPRESSION: Mass-like rectosigmoid wall thickening, surrounding stranding, and adjacent lymphadenopathy, compatible with malignancy with local nodal spread.  Inflammation and local reactive lymph nodes would be much less likely.  PET CT is recommended for global staging purposes and for further evaluation of the above described lung mass, as well as surgical consultation.  These results will be called to the ordering clinician or representative by the Radiologist Assistant, and communication documented in the PACS Dashboard.   Original Report Authenticated By: Christiana Pellant, M.D.   Nm Pet Image Initial (pi) Skull Base To Thigh  11/02/2012   *RADIOLOGY REPORT*  Clinical Data:  Initial treatment strategy for rectal cancer. Circumferential rectal cancer 4 cm  from the anal verge.  4 cm right lung mass.  NUCLEAR MEDICINE PET WHOLE BODY  Fasting Blood Glucose:  123  Technique:  19.1 mCi F-18 FDG was injected intravenously. CT data was obtained and used for attenuation correction and anatomic localization only.  (This was not acquired as a diagnostic CT examination.) Additional exam technical data entered on technologist worksheet.  Comparison:  CT scan from 10/25/2012  Findings:  Head/Neck:   No hypermetabolic lymph nodes in the neck.  Chest:  The previously characterized 5 cm right lower lobe pulmonary mass is hypermetabolic with SUV max = 8.  No evidence for hypermetabolic lymphadenopathy in the mediastinum or either hilum. No other hypermetabolic lung lesions are evident although the patient does have bilateral small pulmonary parenchymal  nodules (see posterior right upper lobe on image 73 and posterior right lower lobe on image 78).  Both of these small nodules are below the accepted size threshold for reliable resolution on PET imaging.  Abdomen/Pelvis:  No abnormal hypermetabolism is identified within the liver.  No hypermetabolic lymphadenopathy in the abdomen.  There is a prominent amount of physiologic F D G uptake in the colon, but this is associated with hypermetabolic uptake in a region of circumferential rectal wall thickening.  SUV max = 9 for the rectal hypermetabolism.  Skeleton:  No focal hypermetabolic activity to suggest skeletal metastasis.  IMPRESSION: Hypermetabolism in the area of apparent circumferential rectal wall thickening associate with hypermetabolic 5 cm right lower lobe pulmonary mass.  6 mm cavitary pulmonary nodule the posterior right upper lobe is not hypermetabolic on PET imaging, but is below the accepted size threshold for reliable resolution on PET.  A [name] tiny right lower lobe nodule is also noted.   Original Report Authenticated By: Kennith Center, M.D.   Dg Chest Port 1 View  10/27/2012   *RADIOLOGY REPORT*  Clinical Data: Post bronchoscopy  PORTABLE CHEST - 1 VIEW  Comparison: 10/25/2012  Findings: Cardiomediastinal silhouette is stable.  Again noted nodular mass in the right lower lobe measures about 5.7 cm.  Mild thoracic dextroscoliosis.  No acute infiltrate or pulmonary edema. There is no diagnostic pneumothorax.  IMPRESSION: No active disease.  Mild thoracic dextroscoliosis.  Again noted nodular mass in right lower lobe measures 5.7 cm.  No diagnostic pneumothorax.   Original Report Authenticated By: Natasha Mead, M.D.   Dg C-arm Bronchoscopy  10/27/2012   CLINICAL DATA: lung mass   C-ARM BRONCHOSCOPY  Fluoroscopy was utilized by the requesting physician.  No radiographic  interpretation.     Assessment and Plan: 67 year old female with new diagnosis of metastatic rectal carcinoma. Patient has oligo  metastasis with a lung mass that is biopsied and consistent with adenocarcinoma. She is now here to discuss her treatment options. She had a PET scan performed we have discussed the results of this. She otherwise feels well postoperatively. We discussed the pathology results also of the biopsy of the lung mass. She understands that it is consistent with metastatic disease. Patient would like to be very aggressive with her treatment options.  #1 patient will be referred to Dr. Edwina Barth for consultation for possible resection of the lung metastasis.  #2 patient was also seen by Dr. Everardo Beals regarding her rectal cancer. She has recommended proceeding with chemotherapy first.  #3 she would also need radiation therapy and I will refer her to Dr. Margaretmary Dys.  #4 we will plan on starting her on chemotherapy consisting of FOLFOX every 2 weeks. This will  begin on 11/22/2012.  #5 patient will need a Port-A-Cath placed. This will be done by Dr. Edwina Barth.  Anne Second, [name] [phone] (beeper) [phone] (Office)

## 2012-11-21 ENCOUNTER — Ambulatory Visit (HOSPITAL_COMMUNITY): Payer: 59 | Admitting: Anesthesiology

## 2012-11-21 ENCOUNTER — Ambulatory Visit (HOSPITAL_COMMUNITY): Payer: 59

## 2012-11-21 ENCOUNTER — Ambulatory Visit (HOSPITAL_COMMUNITY)
Admission: RE | Admit: 2012-11-21 | Discharge: 2012-11-21 | Disposition: A | Discharge status: Home / Self Care | Payer: 59 | Source: Ambulatory Visit | Attending: Thoracic Surgery (Cardiothoracic Vascular Surgery) | Admitting: Thoracic Surgery (Cardiothoracic Vascular Surgery)

## 2012-11-21 ENCOUNTER — Encounter (HOSPITAL_COMMUNITY)
Admission: RE | Disposition: A | Discharge status: Home / Self Care | Payer: Self-pay | Source: Ambulatory Visit | Attending: Thoracic Surgery (Cardiothoracic Vascular Surgery)

## 2012-11-21 ENCOUNTER — Encounter (HOSPITAL_COMMUNITY): Payer: Self-pay | Admitting: Anesthesiology

## 2012-11-21 DIAGNOSIS — C7800 Secondary malignant neoplasm of unspecified lung: Secondary | ICD-10-CM

## 2012-11-21 DIAGNOSIS — C20 Malignant neoplasm of rectum: Secondary | ICD-10-CM | POA: Insufficient documentation

## 2012-11-21 DIAGNOSIS — E119 Type 2 diabetes mellitus without complications: Secondary | ICD-10-CM | POA: Insufficient documentation

## 2012-11-21 DIAGNOSIS — R918 Other nonspecific abnormal finding of lung field: Secondary | ICD-10-CM

## 2012-11-21 HISTORY — PX: PORTACATH PLACEMENT: SHX2246

## 2012-11-21 LAB — GLUCOSE, CAPILLARY
Glucose-Capillary: 133 mg/dL — ABNORMAL HIGH (ref 70–99)
Glucose-Capillary: 117 mg/dL — ABNORMAL HIGH (ref 70–99)

## 2012-11-21 SURGERY — INSERTION, TUNNELED CENTRAL VENOUS DEVICE, WITH PORT
Anesthesia: Monitor Anesthesia Care | Site: Chest | Laterality: Left | Wound class: Clean

## 2012-11-21 MED ORDER — SODIUM CHLORIDE 0.9 % IR SOLN
Status: DC | PRN
  Administered 2012-11-21: 08:00:00

## 2012-11-21 MED ORDER — FENTANYL CITRATE 0.05 MG/ML IJ SOLN
INTRAMUSCULAR | Status: DC | PRN
  Administered 2012-11-21: 25 ug via INTRAVENOUS
  Administered 2012-11-21: 50 ug via INTRAVENOUS

## 2012-11-21 MED ORDER — FENTANYL CITRATE 0.05 MG/ML IJ SOLN: 25.0000 ug | INTRAMUSCULAR | Status: DC | PRN

## 2012-11-21 MED ORDER — ONDANSETRON HCL 4 MG/2ML IJ SOLN: 4.0000 mg | Freq: Four times a day (QID) | INTRAMUSCULAR | Status: DC | PRN

## 2012-11-21 MED ORDER — HEPARIN SOD (PORK) LOCK FLUSH 100 UNIT/ML IV SOLN
INTRAVENOUS | Status: AC
  Filled 2012-11-21: qty 5

## 2012-11-21 MED ORDER — ONDANSETRON HCL 4 MG/2ML IJ SOLN
INTRAMUSCULAR | Status: DC | PRN
  Administered 2012-11-21: 4 mg via INTRAVENOUS

## 2012-11-21 MED ORDER — LIDOCAINE HCL (CARDIAC) 20 MG/ML IV SOLN
INTRAVENOUS | Status: DC | PRN
  Administered 2012-11-21: 50 mg via INTRAVENOUS

## 2012-11-21 MED ORDER — OXYCODONE HCL 5 MG/5ML PO SOLN: 5.0000 mg | Freq: Once | ORAL | Status: DC | PRN

## 2012-11-21 MED ORDER — OXYCODONE HCL 5 MG PO TABS: 5.0000 mg | ORAL_TABLET | Freq: Four times a day (QID) | ORAL | Status: DC | PRN

## 2012-11-21 MED ORDER — OXYCODONE HCL 5 MG PO TABS: 5.0000 mg | ORAL_TABLET | Freq: Once | ORAL | Status: DC | PRN

## 2012-11-21 MED ORDER — PROPOFOL INFUSION 10 MG/ML OPTIME
INTRAVENOUS | Status: DC | PRN
  Administered 2012-11-21: 100 ug/kg/min via INTRAVENOUS

## 2012-11-21 MED ORDER — LACTATED RINGERS IV SOLN
INTRAVENOUS | Status: DC | PRN
  Administered 2012-11-21: 07:00:00 via INTRAVENOUS

## 2012-11-21 MED ORDER — LIDOCAINE HCL 1 % IJ SOLN
INTRAMUSCULAR | Status: DC | PRN
  Administered 2012-11-21: 15 mL

## 2012-11-21 MED ORDER — LIDOCAINE HCL (PF) 1 % IJ SOLN
INTRAMUSCULAR | Status: AC
  Filled 2012-11-21: qty 30

## 2012-11-21 MED ORDER — HEPARIN SODIUM (PORCINE) 1000 UNIT/ML IJ SOLN
INTRAMUSCULAR | Status: AC
  Filled 2012-11-21: qty 1

## 2012-11-21 MED ORDER — HEPARIN SODIUM (PORCINE) 1000 UNIT/ML IJ SOLN
INTRAMUSCULAR | Status: DC | PRN
  Administered 2012-11-21: 3 mL

## 2012-11-21 SURGICAL SUPPLY — 40 items
ADH SKN CLS APL DERMABOND .7 (GAUZE/BANDAGES/DRESSINGS) ×1
ADH SKN CLS LQ APL DERMABOND (GAUZE/BANDAGES/DRESSINGS) ×1
BAG DECANTER FOR FLEXI CONT (MISCELLANEOUS) ×2 IMPLANT
CANISTER SUCTION 2500CC (MISCELLANEOUS) ×2 IMPLANT
CLOTH BEACON ORANGE TIMEOUT ST (SAFETY) ×2 IMPLANT
COVER SURGICAL LIGHT HANDLE (MISCELLANEOUS) ×4 IMPLANT
DERMABOND ADHESIVE PROPEN (GAUZE/BANDAGES/DRESSINGS) ×1
DERMABOND ADVANCED (GAUZE/BANDAGES/DRESSINGS) ×1
DERMABOND ADVANCED .7 DNX12 (GAUZE/BANDAGES/DRESSINGS) ×1 IMPLANT
DERMABOND ADVANCED .7 DNX6 (GAUZE/BANDAGES/DRESSINGS) IMPLANT
DRAPE C-ARM 42X72 X-RAY (DRAPES) ×2 IMPLANT
DRAPE CHEST BREAST 15X10 FENES (DRAPES) ×2 IMPLANT
ELECT REM PT RETURN 9FT ADLT (ELECTROSURGICAL) ×2
ELECTRODE REM PT RTRN 9FT ADLT (ELECTROSURGICAL) ×1 IMPLANT
GLOVE EUDERMIC 7 POWDERFREE (GLOVE) IMPLANT
GLOVE SURG SIGNA 7.5 PF LTX (GLOVE) ×2 IMPLANT
GOWN PREVENTION PLUS XLARGE (GOWN DISPOSABLE) ×4 IMPLANT
GOWN STRL NON-REIN LRG LVL3 (GOWN DISPOSABLE) ×1 IMPLANT
GUIDEWIRE UNCOATED ST S 7038 (WIRE) IMPLANT
INTRODUCER 13FR (MISCELLANEOUS) IMPLANT
INTRODUCER COOK 11FR (CATHETERS) IMPLANT
KIT BASIN OR (CUSTOM PROCEDURE TRAY) ×2 IMPLANT
KIT PORT POWER 9.6FR MRI PREA (Catheter) ×1 IMPLANT
KIT PORT POWER ISP 8FR (Catheter) IMPLANT
KIT POWER CATH 8FR (Catheter) IMPLANT
KIT ROOM TURNOVER OR (KITS) ×2 IMPLANT
NEEDLE 22X1 1/2 (OR ONLY) (NEEDLE) ×2 IMPLANT
NS IRRIG 1000ML POUR BTL (IV SOLUTION) ×2 IMPLANT
PACK GENERAL/GYN (CUSTOM PROCEDURE TRAY) ×2 IMPLANT
PAD ARMBOARD 7.5X6 YLW CONV (MISCELLANEOUS) ×4 IMPLANT
SET SHEATH INTRODUCER 10FR (MISCELLANEOUS) IMPLANT
SUT MNCRL AB 4-0 PS2 18 (SUTURE) ×2 IMPLANT
SUT VIC AB 3-0 SH 27 (SUTURE) ×4
SUT VIC AB 3-0 SH 27X BRD (SUTURE) ×1 IMPLANT
SYR 20CC LL (SYRINGE) ×2 IMPLANT
SYR CONTROL 10ML LL (SYRINGE) ×2 IMPLANT
SYRINGE 10CC LL (SYRINGE) ×2 IMPLANT
TOWEL OR 17X24 6PK STRL BLUE (TOWEL DISPOSABLE) ×2 IMPLANT
TOWEL OR 17X26 10 PK STRL BLUE (TOWEL DISPOSABLE) ×2 IMPLANT
WATER STERILE IRR 1000ML POUR (IV SOLUTION) ×2 IMPLANT

## 2012-11-21 NOTE — Anesthesia Postprocedure Evaluation (Signed)
Anesthesia Post Note  Patient: Anne [name]  Procedure(s) Performed: Procedure(s) (LRB): INSERTION PORT-A-CATH (Left)  Anesthesia type: MAC  Patient location: PACU  Post pain: Pain level controlled and Adequate analgesia  Post assessment: Post-op Vital signs reviewed, Patient's Cardiovascular Status Stable and Respiratory Function Stable  Last Vitals:  Filed Vitals:   11/21/12 0840  BP:   Pulse:   Temp: 36.1 C  Resp:     Post vital signs: Reviewed and stable  Level of consciousness: awake, alert  and oriented  Complications: No apparent anesthesia complications

## 2012-11-21 NOTE — Anesthesia Preprocedure Evaluation (Addendum)
Anesthesia Evaluation  Patient identified by MRN, date of birth, ID band Patient awake    Reviewed: Allergy & Precautions, H&P , NPO status , Patient's Chart, lab work & pertinent test results  Airway Mallampati: II TM Distance: >3 FB Neck ROM: full    Dental  (+) Teeth Intact, Dental Advidsory Given and Implants   Pulmonary          Cardiovascular     Neuro/Psych    GI/Hepatic H/o colon CA   Endo/Other  diabetes, Type 2  Renal/GU      Musculoskeletal   Abdominal   Peds  Hematology  (+) anemia ,   Anesthesia Other Findings Benzodiazepines cause severe anxiety.  Reproductive/Obstetrics                          Anesthesia Physical Anesthesia Plan  ASA: III  Anesthesia Plan: MAC   Post-op Pain Management:    Induction: Intravenous  Airway Management Planned: Simple Face Mask  Additional Equipment:   Intra-op Plan:   Post-operative Plan:   Informed Consent: I have reviewed the patients History and Physical, chart, labs and discussed the procedure including the risks, benefits and alternatives for the proposed anesthesia with the patient or authorized representative who has indicated his/her understanding and acceptance.   Dental Advisory Given  Plan Discussed with: CRNA, Anesthesiologist and Surgeon  Anesthesia Plan Comments:        Anesthesia Quick Evaluation

## 2012-11-21 NOTE — Op Note (Signed)
NAMEGEETA, [name]               ACCOUNT NO.:  [id number]  MEDICAL RECORD NO.:  [id number]  LOCATION:  [facility]                         FACILITY:  [facility]  PHYSICIAN:  Salvatore Decent. Dorris Fetch, M.D.DATE OF BIRTH:  Aug 06, 1945  DATE OF PROCEDURE:  11/21/2012 DATE OF DISCHARGE:                              OPERATIVE REPORT   PREOPERATIVE DIAGNOSIS:  Metastatic rectal cancer, needs IV access for chemotherapy.  POSTOPERATIVE DIAGNOSIS:  Metastatic rectal cancer, needs IV access for chemotherapy.  PROCEDURE:  Port-A-Cath placement, left subclavian vein.  SURGEON:  Salvatore Decent. Dorris Fetch, M.D.  ASSISTANT:  None.  ANESTHESIA:  Local with 15 mL of 1% lidocaine and intravenous sedation.  FINDINGS:  Vein accessed on first pass.  Wire passed without difficulty. Position of the catheter at the junction of superior vena cava and right atrium confirmed with fluoroscopy.  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  She needs intravenous access for chemotherapy.  She was advised to have a Port-A-Cath placement.  The indications, risks, benefits, and alternatives were discussed in detail with the patient.  She understood and accepted the risks and agreed to proceed.  OPERATIVE NOTE:  [name] was brought to the operating room on November 21, 2012, there Anesthesia administered intravenous sedation while monitoring the patient.  Intravenous antibiotics were administered.  The chest was prepped and draped in usual sterile fashion.  1% Lidocaine was used to achieve the local anesthesia initially for placement of the wire into the subclavian vein.  The patient was placed in Trendelenburg position.  The subclavian vein was accessed on the first pass and the wire passed easily.  Fluoroscopy confirmed positioning of the wire within the right atrium.  Additional 1% lidocaine then was used to anesthetize the skin for the incision as well  as the subcutaneous tissue for the port pocket.  The port was cut to length and the port was pre-flushed with heparinized saline.  The peel-away sheath introducer was advanced over the wire.  The wire and inner cannula were removed.  The catheter, which was pre-attached to the port was advanced through the peel-away sheath introducer, which was removed.  Fluoroscopy confirmed positioning of the catheter tip at the right atrium just beyond the atrial caval junction.  There was excellent return through the catheter and it flushed easily.  The port then was placed in the pocket and secured with 3-0 Vicryl sutures to the pectoralis fascia.  The port was flushed with 2.5 mL of concentrated heparin.  The incision was closed in 2 layers with a 3-0 Vicryl subcutaneous suture and a 4-0 Vicryl subcuticular suture.  Dermabond was applied to the wound.  The patient was taken from the operating room to the postanesthetic care unit in good condition.     Salvatore Decent Dorris Fetch, M.D.     SCH/MEDQ  D:  11/21/2012  T:  11/21/2012  Job:  [id number]

## 2012-11-21 NOTE — Progress Notes (Signed)
Dr.Hodierne made aware of pts bp and okay with pt going home at this time. No new orders. Will cont to monitor

## 2012-11-21 NOTE — Anesthesia Procedure Notes (Signed)
Procedure Name: MAC Date/Time: 11/21/2012 7:59 AM Performed by: Carmela Rima Pre-anesthesia Checklist: Patient identified, Emergency Drugs available, Suction available, Patient being monitored and Timeout performed Oxygen Delivery Method: Nasal cannula Placement Confirmation: positive ETCO2

## 2012-11-21 NOTE — Transfer of Care (Signed)
Immediate Anesthesia Transfer of Care Note  Patient: Anne [name]  Procedure(s) Performed: Procedure(s): INSERTION PORT-A-CATH (Left)  Patient Location: PACU  Anesthesia Type:MAC and General  Level of Consciousness: awake, alert  and oriented  Airway & Oxygen Therapy: Patient Spontanous Breathing and Patient connected to nasal cannula oxygen  Post-op Assessment: Report given to PACU RN, Post -op Vital signs reviewed and stable and Patient moving all extremities X 4  Post vital signs: Reviewed and stable  Complications: No apparent anesthesia complications

## 2012-11-21 NOTE — Interval H&P Note (Signed)
History and Physical Interval Note:  11/21/2012 7:36 AM  Anne [name]  has presented today for surgery, with the diagnosis of LUNG MASS  The various methods of treatment have been discussed with the patient and family. After consideration of risks, benefits and other options for treatment, the patient has consented to  Procedure(s): INSERTION PORT-A-CATH (Bilateral) as a surgical intervention .  The patient's history has been reviewed, patient examined, no change in status, stable for surgery.  I have reviewed the patient's chart and labs.  Questions were answered to the patient's satisfaction.     Christophor Eick C

## 2012-11-21 NOTE — H&P (View-Only) (Signed)
PCP is [name], MD Referring Provider is Panosh, Neta Mends, MD  Chief Complaint  Patient presents with  . Lung Mass    surgical eval on new lung mass, PET Scan 11/02/12    HPI: Anne [name] is a 67 year old woman who presents with chief complaint of rectal bleeding and a lung mass.  Anne [name] is a 67 year old woman who was in her usual state of health until July 27. On that day she had a large amount of rectal bleeding. She had had a colonoscopy 2 and half years ago which showed 3 small adenomatous polyps and diverticulosis. She thought she probably had a diverticular bleed and went to the emergency room at [facility]. Workup there revealed a rectal mass and a right lower lobe mass. 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 Dr. Craige Cotta performed a bronchoscopic biopsy of the lung mass which was positive for metastatic adenocarcinoma.  She has not had any further bleeding since the initial episode. She was feeling well up until the time of the gastrointestinal bleeding. She had lost about 60 pounds over the previous 18-24 months due to diet and exercise. She's not had any unexplained weight loss. She is a lifelong nonsmoker and has no history of asthma or COPD. She denies any cough or wheezing. She has not had any hemoptysis. She does have a history of a heart murmur which was worked up in April 2013. She was found by echocardiography to have normal left ventricular function, mild aortic insufficiency and mild mitral insufficiency.   Past Medical History  Diagnosis Date  . Heart murmur, systolic 07/01/2011  . Postmenopausal HRT (hormone replacement therapy) 07/01/2011    surgical menopause  . Hx of hyperglycemia 07/01/2011  . Hx of compression fracture of spine 1985    t12 after a  20 foot fall  . Hx of fracture of wrist     after fall   . 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 Hx of colonic polyps   . Early cataract     stoneburner   . High frequency hearing loss    [facility] followed   . Diabetes mellitus without complication   . Cancer   . Skin cancer     Past Surgical History  Procedure Laterality Date  . Tonsillectomy  1952  . Adenoidectomy  1952  . Wrist surgery  1957    left reduction  . Compression fraction  1985    T12  . Anterior cruciate ligament repair  1993    right Murphy  . Cosmetic surgery  1999-2007    holderness blepharoplasty facial rhytidectomy mastopexy abdominoplasty   . Dental implant    . Dilation and curettage of uterus  1977  . Total abdominal hysterectomy  2005    with cystocele repair   . Colonoscopy N/A 10/24/2012    Procedure: COLONOSCOPY;  Surgeon: Hilarie Fredrickson, MD;  Location: [facility] ENDOSCOPY;  Service: Endoscopy;  Laterality: N/A;  . Colonoscopy N/A 10/25/2012    Procedure: COLONOSCOPY;  Surgeon: Hilarie Fredrickson, MD;  Location: [facility] ENDOSCOPY;  Service: Endoscopy;  Laterality: N/A;  . Video bronchoscopy Bilateral 10/27/2012    Procedure: VIDEO BRONCHOSCOPY WITH FLUORO;  Surgeon: Coralyn Helling, MD;  Location: [facility] ENDOSCOPY;  Service: Endoscopy;  Laterality: Bilateral;    Family History  Problem Relation Age of Onset  . Diabetes Father   . Cancer Father     oral  . Thyroid disease Father   . Heart  attack Father     died age 21  . Dementia Mother   . Hypertension Mother   . Hyperlipidemia Mother   . Cancer Brother     retro lymphosarcoma agent orange  . Cancer Sister     clear cell ovarina and breast insitu  . Obesity      siblings   . Diabetes Brother   . Alcohol abuse Brother     hx of  . Other      arrythmia   . Hypertension Sister   . Asthma Sister     Social History History  Substance Use Topics  . Smoking status: Never Smoker   . Smokeless tobacco: Never Used  . Alcohol Use: 1.0 oz/week    2 drink(s) per week    Current Outpatient Prescriptions  Medication Sig Dispense Refill  . Calcium Carbonate Antacid (TUMS E-X PO) Take 2 tablets by mouth daily.      . Calcium Carbonate-Vitamin D (CALTRATE  600+D PO) Take 1 tablet by mouth daily.       . carbamide peroxide (DEBROX) 6.5 % otic solution Place 5 drops into both ears 2 (two) times daily.      Marland Kitchen estradiol (MINIVELLE) 0.0375 MG/24HR Place 1 patch onto the skin 2 (two) times a week. Sundays and Thursdays      . metFORMIN (GLUCOPHAGE) 1000 MG tablet Take 1,000 mg by mouth 2 (two) times daily with a meal.      . METHYLTESTOSTERONE PO Take 1.25 mg by mouth daily.      . Multiple Vitamin (MULTIVITAMIN) tablet Take 1 tablet by mouth daily. Senior      . polyethylene glycol (MIRALAX / GLYCOLAX) packet Take 17 g by mouth daily.  100 each  0  . Probiotic Product (SOLUBLE FIBER/PROBIOTICS PO) Take 1 tablet by mouth daily.      . sitaGLIPtin (JANUVIA) 100 MG tablet Take 100 mg by mouth daily.       No current facility-administered medications for this visit.    Allergies  Allergen Reactions  . Midazolam Other (See Comments)    Caused very crazy feelings, terrors, panic attacks, anxiety  . Zolpidem Tartrate Other (See Comments)    Caused very crazy feelings, terrors, shaking, panic attacks, anxiety  . Neomycin-Bacitracin Zn-Polymyx Itching and Rash    Review of Systems  Constitutional: Positive for fatigue (after GIB, due to anemia). Negative for unexpected weight change (has lost 60 pounds over 2 years due to diet and exercise).  HENT: Positive for hearing loss (mild).   Cardiovascular:       Heart murmur  Gastrointestinal: Positive for blood in stool. Negative for abdominal pain and abdominal distention.  Endocrine:       Hyperglycemia- on PO meds- "pre-diabetes"  Neurological: Negative.   Hematological: Does not bruise/bleed easily.  All other systems reviewed and are negative.    BP 164/84  Pulse 97  Resp 20  Ht 5' 4.5" (1.638 m)  Wt 140 lb (63.504 kg)  BMI 23.67 kg/m2  SpO2 98% Physical Exam  Vitals reviewed. Constitutional: She is oriented to person, place, and time. She appears well-developed and well-nourished. No  distress.  HENT:  Head: Normocephalic and atraumatic.  Eyes: EOM are normal. Pupils are equal, round, and reactive to light.  Neck: Neck supple. No thyromegaly present.  Cardiovascular: Normal rate and regular rhythm.   Murmur (2/6 systolic murmur) heard. Pulmonary/Chest: Effort normal and breath sounds normal. She has no wheezes. She has no rales.  Abdominal:  Soft. There is no tenderness.  Musculoskeletal: She exhibits no edema.  Lymphadenopathy:    She has no cervical adenopathy.  Neurological: She is alert and oriented to person, place, and time. No cranial nerve deficit.  No focal motor deficit  Skin: Skin is warm and dry.     Diagnostic Tests: CT of chest 10/25/2012 *RADIOLOGY REPORT*  Clinical Data: Rectal bleeding, rectal mass at colonoscopy  10/24/2012  CT CHEST, ABDOMEN AND PELVIS WITH CONTRAST  Technique: Multidetector CT imaging of the chest, abdomen and  pelvis was performed following the standard protocol during bolus  administration of intravenous contrast.  Contrast: OMNIPAQUE IOHEXOL 300 MG/ML SOLN  Comparison: CT abdomen/pelvis without contrast 09/10/2010  CT CHEST  Findings: There is a lobulated irregular mass in the superior  segment right lower lobe measuring 5.0 x 4.6 cm image 41. Rim  calcified right upper lobe 0.8 cm nodule which is centrally lucent  is noted on image 25. 2 and 3 mm left intrapulmonary lymph node  abutting the major fissure are noted images 29 and 31. Central  airways are patent.  Heart size is normal. No lymphadenopathy. No pericardial or  pleural effusion. There is mild osteopenia in the thoracic spine  with multilevel disc degenerative change but no focal lytic or  sclerotic osseous lesion is identified.  IMPRESSION:  Irregular right lower lobe superior segment pulmonary mass which  primary considerations include primary bronchogenic carcinoma or  less likely solitary metastasis.  CT ABDOMEN AND PELVIS  Findings: There is  mass-like rectosigmoid colonic wall thickening  measuring 2.9 cm in thickness image 102. Adjacent pericolonic  stranding and abnormal lymphadenopathy is noted, with dominant  right pericolonic lymph node measuring 0.8 cm image 102. The mass-  like thickening immediately abuts the bladder but no obvious  intravesical invasion is identified allowing for technique. 4 mm  short axis lymph node image 85 at the iliac artery bifurcation is  nonspecific but new since the prior exam as well and suspicious.  Uterus and ovaries are not identified, compatible with prior total  hysterectomy per report. Small bowel and appendix are normal.  Liver, kidneys, adrenal glands, spleen, gallbladder, and pancreas  are normal. Degenerative changes are noted in the spine with areas  of trabecular rarefaction but no focal identifiable osseous lesion.  IMPRESSION:  Mass-like rectosigmoid wall thickening, surrounding stranding, and  adjacent lymphadenopathy, compatible with malignancy with local  nodal spread. Inflammation and local reactive lymph nodes would be  much less likely.  PET CT is recommended for global staging purposes and for further  evaluation of the above described lung mass, as well as surgical  consultation.  These results will be called to the ordering clinician or  representative by the Radiologist Assistant, and communication  documented in the PACS Dashboard.  Original Report Authenticated By: Christiana Pellant, M.D.  PET/CT 11/02/2012 *RADIOLOGY REPORT*  Clinical Data: Initial treatment strategy for rectal cancer.  Circumferential rectal cancer 4 cm from the anal verge. 4 cm right  lung mass.  NUCLEAR MEDICINE PET WHOLE BODY  Fasting Blood Glucose: 123  Technique: 19.1 mCi F-18 FDG was injected intravenously. CT data  was obtained and used for attenuation correction and anatomic  localization only. (This was not acquired as a diagnostic CT  examination.) Additional exam technical data  entered on  technologist worksheet.  Comparison: CT scan from 10/25/2012  Findings:  Head/Neck: No hypermetabolic lymph nodes in the neck.  Chest: The previously characterized 5 cm right lower lobe  pulmonary mass is hypermetabolic with SUV max = 8. No evidence for  hypermetabolic lymphadenopathy in the mediastinum or either hilum.  No other hypermetabolic lung lesions are evident although the  patient does have bilateral small pulmonary parenchymal nodules  (see posterior right upper lobe on image 73 and posterior right  lower lobe on image 78). Both of these small nodules are below the  accepted size threshold for reliable resolution on PET imaging.  Abdomen/Pelvis: No abnormal hypermetabolism is identified within  the liver. No hypermetabolic lymphadenopathy in the abdomen.  There is a prominent amount of physiologic F D G uptake in the  colon, but this is associated with hypermetabolic uptake in a  region of circumferential rectal wall thickening. SUV max = 9 for  the rectal hypermetabolism.  Skeleton: No focal hypermetabolic activity to suggest skeletal  metastasis.  IMPRESSION:  Hypermetabolism in the area of apparent circumferential rectal wall  thickening associate with hypermetabolic 5 cm right lower lobe  pulmonary mass.  6 mm cavitary pulmonary nodule the posterior right upper lobe is  not hypermetabolic on PET imaging, but is below the accepted size  threshold for reliable resolution on PET. A second tiny right  lower lobe nodule is also noted.  Original Report Authenticated By: Kennith Center, M.D.  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 Workup revealed a rectal mass, which turned out to be adenocarcinoma. She also was found to have a 5.0 x 4.6 cm right lower lobe mass and a 6 mm nodule in the right upper lobe. She had a bronchoscopy and biopsy of the right lower lobe mass. This turned out to be  an adenocarcinoma which stained for colorectal markers and not lung markers and is consistent with metastatic disease..  The plan for treatment for her rectal cancer was to proceed with chemotherapy and radiation therapy to be followed by surgery in the future. She now had questions about whether to have a lung mass resected prior to initiation of systemic therapy or whether to wait and have the lung resection later in her course. In my opinion, given that this is a metastatic lesion, she would be best served by starting systemic treatment right away. She also can have local treatment to the primary with radiation. I would hate to delay treatment of the primary and systemic treatment for the 6-8 weeks to take her to get over a thoracotomy and lobectomy.  Given the size of the lesion presently she would require a full thoracotomy and lobectomy for the right lower lobe mass. Of course we would do a wedge resection of the right upper lobe lesion as well at the time of thoracotomy. However if she does have a good response to systemic therapy in the lesion shrinks she might be a candidate for a minimally invasive approach and possibly a small resection such as a wedge or segmentectomy.  Plan: Proceed with chemotherapy and radiation as initial therapy.  I will be happy to see her back to discuss metastectomy when Dr. Park Breed feels its the appropriate time.  ADDENDUM  After leaving the office [name] called back asking if I would be able to place a Port-A-Cath on Monday, August 25. She had been scheduled to have a Port-A-Cath placed during the [place] the [place]. She was unable to reschedule for the 25th with [facility] surgery and requests that I place it on that date.  I discussed with her the general nature of  the procedure, using local anesthesia and sedation. She is familiar with the indications, risks, benefits, and alternatives. She understands the risk include bleeding, infection,  and venous thrombosis. She accepts the risk and wishes to proceed. We will schedule her for Monday August 25th

## 2012-11-21 NOTE — Brief Op Note (Signed)
11/21/2012  8:55 AM  PATIENT:  Anne [name]  67 y.o. female  PRE-OPERATIVE DIAGNOSIS:  LUNG MASS  POST-OPERATIVE DIAGNOSIS:  LUNG MASS  PROCEDURE:  Procedure(s): INSERTION PORT-A-CATH (Left)  SURGEON:  Surgeon(s) and Role:    * Loreli Slot, MD - Primary   ANESTHESIA:   local and IV sedation  EBL:  Total I/O In: 700 [I.V.:700] Out: -   BLOOD ADMINISTERED:none  LOCAL MEDICATIONS USED:  LIDOCAINE  and Amount: 15 ml  SPECIMEN:  No Specimen  COUNTS:  YES  PLAN OF CARE: Discharge to home after PACU  PATIENT DISPOSITION:  PACU - hemodynamically stable.   Delay start of Pharmacological VTE agent (>24hrs) due to surgical blood loss or risk of bleeding: not applicable

## 2012-11-21 NOTE — Preoperative (Signed)
Beta Blockers   Reason not to administer Beta Blockers:Not Applicable 

## 2012-11-22 ENCOUNTER — Ambulatory Visit (HOSPITAL_BASED_OUTPATIENT_CLINIC_OR_DEPARTMENT_OTHER): Payer: 59

## 2012-11-22 ENCOUNTER — Encounter (HOSPITAL_COMMUNITY): Payer: Self-pay | Admitting: Thoracic Surgery (Cardiothoracic Vascular Surgery)

## 2012-11-22 ENCOUNTER — Other Ambulatory Visit: Payer: Self-pay | Admitting: Emergency Medicine

## 2012-11-22 ENCOUNTER — Telehealth: Payer: Self-pay | Admitting: Oncology

## 2012-11-22 ENCOUNTER — Other Ambulatory Visit (HOSPITAL_BASED_OUTPATIENT_CLINIC_OR_DEPARTMENT_OTHER): Payer: 59

## 2012-11-22 ENCOUNTER — Ambulatory Visit (HOSPITAL_BASED_OUTPATIENT_CLINIC_OR_DEPARTMENT_OTHER): Payer: 59 | Admitting: Oncology

## 2012-11-22 VITALS — BP 195/80 | HR 98 | Temp 98.1°F | Resp 20 | Ht 64.5 in | Wt 141.4 lb

## 2012-11-22 DIAGNOSIS — C20 Malignant neoplasm of rectum: Secondary | ICD-10-CM

## 2012-11-22 DIAGNOSIS — C7800 Secondary malignant neoplasm of unspecified lung: Secondary | ICD-10-CM

## 2012-11-22 DIAGNOSIS — Z5111 Encounter for antineoplastic chemotherapy: Secondary | ICD-10-CM

## 2012-11-22 LAB — COMPREHENSIVE METABOLIC PANEL (CC13)
ALT: 12 U/L (ref 0–55)
AST: 13 U/L (ref 5–34)
Albumin: 3.9 g/dL (ref 3.5–5.0)
Alkaline Phosphatase: 56 U/L (ref 40–150)
BUN: 8.5 mg/dL (ref 7.0–26.0)
Calcium: 10.5 mg/dL — ABNORMAL HIGH (ref 8.4–10.4)
Chloride: 103 mEq/L (ref 98–109)
Potassium: 3.9 mEq/L (ref 3.5–5.1)
Sodium: 142 mEq/L (ref 136–145)
Total Protein: 7.5 g/dL (ref 6.4–8.3)

## 2012-11-22 LAB — CBC WITH DIFFERENTIAL/PLATELET
BASO%: 0.7 % (ref 0.0–2.0)
EOS%: 3.7 % (ref 0.0–7.0)
HCT: 31.6 % — ABNORMAL LOW (ref 34.8–46.6)
MCHC: 32 g/dL (ref 31.5–36.0)
MONO#: 0.5 10*3/uL (ref 0.1–0.9)
NEUT%: 61.2 % (ref 38.4–76.8)
RDW: 14.8 % — ABNORMAL HIGH (ref 11.2–14.5)
WBC: 8.1 10*3/uL (ref 3.9–10.3)
lymph#: 2.3 10*3/uL (ref 0.9–3.3)
nRBC: 0 % (ref 0–0)

## 2012-11-22 MED ORDER — SODIUM CHLORIDE 0.9 % IV SOLN
2400.0000 mg/m2 | INTRAVENOUS | Status: DC
  Administered 2012-11-22: 4100 mg via INTRAVENOUS
  Filled 2012-11-22: qty 82

## 2012-11-22 MED ORDER — OXALIPLATIN CHEMO INJECTION 100 MG/20ML
85.0000 mg/m2 | Freq: Once | INTRAVENOUS | Status: AC
  Administered 2012-11-22: 145 mg via INTRAVENOUS
  Filled 2012-11-22: qty 29

## 2012-11-22 MED ORDER — ONDANSETRON 8 MG/50ML IVPB (CHCC)
8.0000 mg | Freq: Once | INTRAVENOUS | Status: AC
  Administered 2012-11-22: 8 mg via INTRAVENOUS

## 2012-11-22 MED ORDER — LEUCOVORIN CALCIUM INJECTION 350 MG
400.0000 mg/m2 | Freq: Once | INTRAVENOUS | Status: AC
  Administered 2012-11-22: 700 mg via INTRAVENOUS
  Filled 2012-11-22: qty 35

## 2012-11-22 MED ORDER — DEXAMETHASONE SODIUM PHOSPHATE 10 MG/ML IJ SOLN
10.0000 mg | Freq: Once | INTRAMUSCULAR | Status: AC
  Administered 2012-11-22: 10 mg via INTRAVENOUS

## 2012-11-22 MED ORDER — FLUOROURACIL CHEMO INJECTION 2.5 GM/50ML
400.0000 mg/m2 | Freq: Once | INTRAVENOUS | Status: AC
  Administered 2012-11-22: 700 mg via INTRAVENOUS
  Filled 2012-11-22: qty 14

## 2012-11-22 MED ORDER — DEXTROSE 5 % IV SOLN
Freq: Once | INTRAVENOUS | Status: AC
  Administered 2012-11-22: 13:00:00 via INTRAVENOUS

## 2012-11-22 NOTE — Patient Instructions (Addendum)
[facility] Discharge Instructions for Patients Receiving Chemotherapy  Today you received the following chemotherapy agents Oxaliplatin/Leucovorin/5FU  To help prevent nausea and vomiting after your treatment, we encourage you to take your nausea medication as prescribed.   If you develop nausea and vomiting that is not controlled by your nausea medication, call the clinic.   BELOW ARE SYMPTOMS THAT SHOULD BE REPORTED IMMEDIATELY:  *FEVER GREATER THAN 100.5 F  *CHILLS WITH OR WITHOUT FEVER  NAUSEA AND VOMITING THAT IS NOT CONTROLLED WITH YOUR NAUSEA MEDICATION  *UNUSUAL SHORTNESS OF BREATH  *UNUSUAL BRUISING OR BLEEDING  TENDERNESS IN MOUTH AND THROAT WITH OR WITHOUT PRESENCE OF ULCERS  *URINARY PROBLEMS  *BOWEL PROBLEMS  UNUSUAL RASH Items with * indicate a potential emergency and should be followed up as soon as possible.  Feel free to call the clinic you have any questions or concerns. The clinic phone number is [phone].    

## 2012-11-22 NOTE — Telephone Encounter (Signed)
gv pt appt schedule for August thru October.  °

## 2012-11-23 ENCOUNTER — Telehealth: Payer: Self-pay | Admitting: *Deleted

## 2012-11-23 NOTE — Telephone Encounter (Signed)
Called pt today for chemo f/u call.  Pt stated she is doing fine.  Pt denied nausea/vomiting, bowel and bladder functioning fine.  Pt stated she did not get enough sleep last night due to hearing the pump running.  Pt has antiemetics available.  Reinforced cold sensitivity with pt.  Pt understood to call office with any new problems.  Pt also aware of pump disconnect appt on 11/24/12.

## 2012-11-24 ENCOUNTER — Ambulatory Visit (HOSPITAL_BASED_OUTPATIENT_CLINIC_OR_DEPARTMENT_OTHER): Payer: 59

## 2012-11-24 VITALS — BP 171/91 | HR 88 | Temp 97.6°F | Resp 20

## 2012-11-24 DIAGNOSIS — C7800 Secondary malignant neoplasm of unspecified lung: Secondary | ICD-10-CM

## 2012-11-24 DIAGNOSIS — C20 Malignant neoplasm of rectum: Secondary | ICD-10-CM

## 2012-11-24 MED ORDER — SODIUM CHLORIDE 0.9 % IJ SOLN
10.0000 mL | INTRAMUSCULAR | Status: DC | PRN
  Administered 2012-11-24: 10 mL
  Filled 2012-11-24: qty 10

## 2012-11-24 MED ORDER — HEPARIN SOD (PORK) LOCK FLUSH 100 UNIT/ML IV SOLN
500.0000 [IU] | Freq: Once | INTRAVENOUS | Status: AC | PRN
  Administered 2012-11-24: 500 [IU]
  Filled 2012-11-24: qty 5

## 2012-11-24 NOTE — Patient Instructions (Addendum)

## 2012-11-24 NOTE — Addendum Note (Signed)
Addended by: Lenn Sink I on: 11/24/2012 02:38 PM   Modules accepted: Orders

## 2012-11-28 NOTE — Progress Notes (Signed)
OFFICE PROGRESS NOTE  CC  Anne Harp, MD [address] [place] [address]  DIAGNOSIS: 67 year old female with new diagnosis of rectal cancer with lung metastasis   PRIOR THERAPY: #17/27/2014 with acute episode of bright red blood per rectum and large clot formation, prompting her to be evaluated at the ED. Of note,colonoscopy prior to this admission had been performed on 03/04/10(screening) which showed three polyps ,largest 8 mm, removed, as well as moderate diverticulosis in the sigmoid colon, otherwise normal exam . On presentation, she had Positive Guaiac and a rectal mass had been palpated on exam. On 7/29 she underwent colonoscopy (Dr. Marina Goodell) which revealed a bulky, circumferential, friable mass lesion in the rectum of about 4 cm above the dentate line, subtotally obstructing and would not permit passage of the colonoscope proximally. Multiple biopsies were taken.  #2 patient's pathology revealed consistent with adenocarcinoma with primary from rectal. Lung biopsy was performed also that revealed metastatic disease.  #3 patient was seen by Dr. Dorris Fetch for possible resection of the lung metastasis. He recommended we proceed with chemotherapy first to see if we could do both lung metastases and with eventual resection.  #4 patient is recommended to proceed with palliative chemotherapy consisting of FOLFOX. We will hold Avastin every 2 rectal bleeding.    CURRENT THERAPY:cycle 1 of modified FOLFOX 6  INTERVAL HISTORY: Anne [name] 67 y.o. female returns for followup visit prior to her chemotherapy.  She had staging PET scan performed which revealed disease in the region as well as the lung mass. She overall feels well. She has no nausea vomiting fevers chills night sweats she had her Port-A-Cath placed by Dr. Dorris Fetch. She is accompanied by her daughter today. She is ready to get her chemotherapy.  MEDICAL HISTORY: Past Medical History  Diagnosis Date  .  Heart murmur, systolic 07/01/2011  . Postmenopausal HRT (hormone replacement therapy) 07/01/2011    surgical menopause  . Hx of hyperglycemia 07/01/2011  . Hx of compression fracture of spine 1985    t12 after a  20 foot fall  . Hx of fracture of wrist     after fall   . 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 Hx of colonic polyps   . Early cataract     stoneburner   . High frequency hearing loss     [facility] followed   . Diabetes mellitus without complication   . Cancer     colon  . Skin cancer   . Complication of anesthesia     pt hadpanic attacks with versed, no versed    ALLERGIES:  is allergic to ativan; midazolam; zolpidem tartrate; and neomycin-bacitracin zn-polymyx.  MEDICATIONS:  Current Outpatient Prescriptions  Medication Sig Dispense Refill  . Calcium Carbonate Antacid (TUMS E-X PO) Take 2 tablets by mouth daily.      . Calcium Carbonate-Vitamin D (CALTRATE 600+D PO) Take 1 tablet by mouth daily.       . carbamide peroxide (DEBROX) 6.5 % otic solution Place 5 drops into both ears 2 (two) times daily as needed. Ear wax.      . estradiol (MINIVELLE) 0.0375 MG/24HR Place 1 patch onto the skin 2 (two) times a week. Sundays and Thursdays      . lidocaine-prilocaine (EMLA) cream Apply topically as needed.  30 g  5  . metFORMIN (GLUCOPHAGE) 1000 MG tablet Take 1,000 mg by mouth 2 (two) times daily with a meal.      .  METHYLTESTOSTERONE PO Take 1.25 mg by mouth daily.      . Multiple Vitamin (MULTIVITAMIN) tablet Take 1 tablet by mouth daily. Senior      . oxyCODONE (ROXICODONE) 5 MG immediate release tablet Take 1-2 tablets (5-10 mg total) by mouth 4 (four) times daily as needed for pain.  30 tablet  0  . polyethylene glycol (MIRALAX / GLYCOLAX) packet Take 17 g by mouth daily.  100 each  0  . Probiotic Product (SOLUBLE FIBER/PROBIOTICS PO) Take 1 tablet by mouth daily.      . sitaGLIPtin (JANUVIA) 100 MG tablet Take 100 mg by mouth daily.      Marland Kitchen dexamethasone (DECADRON) 4 MG  tablet Take 2 tablets (8 mg total) by mouth 2 (two) times daily with a meal. Take daily starting the day after chemotherapy for 2 days. Take with food.  30 tablet  1  . ondansetron (ZOFRAN ODT) 8 MG disintegrating tablet Take 1 tablet (8 mg total) by mouth every 8 (eight) hours as needed for nausea.  30 tablet  2   No current facility-administered medications for this visit.    SURGICAL HISTORY:  Past Surgical History  Procedure Laterality Date  . Tonsillectomy  1952  . Adenoidectomy  1952  . Wrist surgery  1957    left reduction  . Compression fraction  1985    T12  . Anterior cruciate ligament repair  1993    right Murphy  . Cosmetic surgery  1999-2007    holderness blepharoplasty facial rhytidectomy mastopexy abdominoplasty   . Dental implant    . Dilation and curettage of uterus  1977  . Total abdominal hysterectomy  2005    with cystocele repair   . Colonoscopy N/A 10/24/2012    Procedure: COLONOSCOPY;  Surgeon: Hilarie Fredrickson, MD;  Location: [facility] ENDOSCOPY;  Service: Endoscopy;  Laterality: N/A;  . Colonoscopy N/A 10/25/2012    Procedure: COLONOSCOPY;  Surgeon: Hilarie Fredrickson, MD;  Location: [facility] ENDOSCOPY;  Service: Endoscopy;  Laterality: N/A;  . Video bronchoscopy Bilateral 10/27/2012    Procedure: VIDEO BRONCHOSCOPY WITH FLUORO;  Surgeon: Coralyn Helling, MD;  Location: [facility] ENDOSCOPY;  Service: Endoscopy;  Laterality: Bilateral;  . Portacath placement Left 11/21/2012    Procedure: INSERTION PORT-A-CATH;  Surgeon: Loreli Slot, MD;  Location: [facility] OR;  Service: Thoracic;  Laterality: Left;    REVIEW OF SYSTEMS:  Pertinent items are noted in HPI.   HEALTH MAINTENANCE:   PHYSICAL EXAMINATION: Blood pressure 195/80, pulse 98, temperature 98.1 F (36.7 C), temperature source Oral, resp. rate 20, height 5' 4.5" (1.638 m), weight 141 lb 6.4 oz (64.139 kg). Body mass index is 23.91 kg/(m^2). ECOG PERFORMANCE STATUS: 0 - Asymptomatic   General appearance: alert and cooperative Lymph  nodes: Cervical, supraclavicular, and axillary nodes normal. Resp: clear to auscultation bilaterally Cardio: regular rate and rhythm GI: soft, non-tender; bowel sounds normal; no masses,  no organomegaly Extremities: extremities normal, atraumatic, no cyanosis or edema Neurologic: Grossly normal   LABORATORY DATA: Lab Results  Component Value Date   WBC 8.1 11/22/2012   HGB 10.1* 11/22/2012   HCT 31.6* 11/22/2012   MCV 77.8* 11/22/2012   PLT 371 11/22/2012      Chemistry      Component Value Date/Time   NA 142 11/22/2012 1005   NA 137 11/18/2012 1528   K 3.9 11/22/2012 1005   K 4.0 11/18/2012 1528   CL 99 11/18/2012 1528   CO2 27 11/22/2012 1005   CO2  28 11/18/2012 1528   BUN 8.5 11/22/2012 1005   BUN 11 11/18/2012 1528   CREATININE 0.7 11/22/2012 1005   CREATININE 0.61 11/18/2012 1528      Component Value Date/Time   CALCIUM 10.5* 11/22/2012 1005   CALCIUM 10.3 11/18/2012 1528   ALKPHOS 56 11/22/2012 1005   ALKPHOS 56 11/18/2012 1528   AST 13 11/22/2012 1005   AST 15 11/18/2012 1528   ALT 12 11/22/2012 1005   ALT 15 11/18/2012 1528   BILITOT 0.33 11/22/2012 1005   BILITOT 0.1* 11/18/2012 1528       RADIOGRAPHIC STUDIES:  Dg Chest 2 View Within Previous 72 Hours.  Films Obtained On Friday Are Acceptable For Monday And Tuesday Cases  11/18/2012   CLINICAL DATA:  67 year old female preoperative study. Lung mass. Colon cancer.  EXAM: CHEST  2 VIEW  COMPARISON:  PET-CT 11/02/2012 and earlier.  FINDINGS: 5 cm right lower lobe lung mass re- identified. Radiographically this appears not significantly changed since 10/25/2012. The 6 mm cavitary right upper lobe pulmonary nodule is faint but visible. Stable lung volumes. Stable cardiac size and mediastinal contours. The left lung is clear. No pneumothorax or effusion. Stable visualized osseous structures. Chronic T12 compression fracture. Flowing thoracic osteophytes.  IMPRESSION: Known right lung lesions. No new cardiopulmonary abnormality.    Electronically Signed   By: Augusto Gamble   On: 11/18/2012 15:53   Nm Pet Image Initial (pi) Skull Base To Thigh  11/02/2012   *RADIOLOGY REPORT*  Clinical Data:  Initial treatment strategy for rectal cancer. Circumferential rectal cancer 4 cm from the anal verge.  4 cm right lung mass.  NUCLEAR MEDICINE PET WHOLE BODY  Fasting Blood Glucose:  123  Technique:  19.1 mCi F-18 FDG was injected intravenously. CT data was obtained and used for attenuation correction and anatomic localization only.  (This was not acquired as a diagnostic CT examination.) Additional exam technical data entered on technologist worksheet.  Comparison:  CT scan from 10/25/2012  Findings:  Head/Neck:   No hypermetabolic lymph nodes in the neck.  Chest:  The previously characterized 5 cm right lower lobe pulmonary mass is hypermetabolic with SUV max = 8.  No evidence for hypermetabolic lymphadenopathy in the mediastinum or either hilum. No other hypermetabolic lung lesions are evident although the patient does have bilateral small pulmonary parenchymal nodules (see posterior right upper lobe on image 73 and posterior right lower lobe on image 78).  Both of these small nodules are below the accepted size threshold for reliable resolution on PET imaging.  Abdomen/Pelvis:  No abnormal hypermetabolism is identified within the liver.  No hypermetabolic lymphadenopathy in the abdomen.  There is a prominent amount of physiologic F D G uptake in the colon, but this is associated with hypermetabolic uptake in a region of circumferential rectal wall thickening.  SUV max = 9 for the rectal hypermetabolism.  Skeleton:  No focal hypermetabolic activity to suggest skeletal metastasis.  IMPRESSION: Hypermetabolism in the area of apparent circumferential rectal wall thickening associate with hypermetabolic 5 cm right lower lobe pulmonary mass.  6 mm cavitary pulmonary nodule the posterior right upper lobe is not hypermetabolic on PET imaging, but is below the  accepted size threshold for reliable resolution on PET.  A second tiny right lower lobe nodule is also noted.   Original Report Authenticated By: Kennith Center, M.D.   Dg Fluoro Guide Cv Line-no Report  11/21/2012   CLINICAL DATA: Done in OR # 10,Intraoperative insertion port a cath  FLOURO GUIDE CV LINE  Fluoroscopy was utilized by the requesting physician.  No radiographic  interpretation.     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 She is in oligo metastases patient is very interested in being aggressive with her disease. At this time we will proceed with cycle 1 of modified FOLFOX 6. She will receive a total of 6 cycles of this. This will then be followed by radiation with radiosensitizing a low.. She will then finish up another 6 cycles of FOLFOX 6.  #2 once she completes this then she will go on to surgery for the lung metastases resection.   PLAN:   #1 proceed with FOLFOX cycle 1 day 1 today.  #2 she will be seen in one week's time for followup   All questions were answered. The patient knows to call the clinic with any problems, questions or concerns. We can certainly see the patient much sooner if necessary.  I spent 25 minutes counseling the patient face to face. The total time spent in the appointment was 30 minutes.    Drue Second, MD Medical/Oncology [facility] [phone] (beeper) [phone] (Office)

## 2012-11-30 ENCOUNTER — Encounter: Payer: Self-pay | Admitting: Adult Health

## 2012-11-30 ENCOUNTER — Other Ambulatory Visit (HOSPITAL_BASED_OUTPATIENT_CLINIC_OR_DEPARTMENT_OTHER): Payer: 59 | Admitting: Lab

## 2012-11-30 ENCOUNTER — Ambulatory Visit (HOSPITAL_BASED_OUTPATIENT_CLINIC_OR_DEPARTMENT_OTHER): Payer: 59 | Admitting: Adult Health

## 2012-11-30 VITALS — BP 176/84 | HR 78 | Temp 99.6°F | Resp 18 | Ht 64.5 in | Wt 140.6 lb

## 2012-11-30 DIAGNOSIS — C20 Malignant neoplasm of rectum: Secondary | ICD-10-CM

## 2012-11-30 DIAGNOSIS — C7800 Secondary malignant neoplasm of unspecified lung: Secondary | ICD-10-CM

## 2012-11-30 DIAGNOSIS — R0781 Pleurodynia: Secondary | ICD-10-CM

## 2012-11-30 LAB — CBC WITH DIFFERENTIAL/PLATELET
BASO%: 0.8 % (ref 0.0–2.0)
EOS%: 1.8 % (ref 0.0–7.0)
MCH: 25.2 pg (ref 25.1–34.0)
MCV: 77 fL — ABNORMAL LOW (ref 79.5–101.0)
MONO%: 5.9 % (ref 0.0–14.0)
RBC: 3.67 10*6/uL — ABNORMAL LOW (ref 3.70–5.45)
RDW: 15.6 % — ABNORMAL HIGH (ref 11.2–14.5)
lymph#: 1.4 10*3/uL (ref 0.9–3.3)

## 2012-11-30 LAB — COMPREHENSIVE METABOLIC PANEL (CC13)
ALT: 26 U/L (ref 0–55)
AST: 22 U/L (ref 5–34)
Alkaline Phosphatase: 58 U/L (ref 40–150)
Glucose: 147 mg/dl — ABNORMAL HIGH (ref 70–140)
Potassium: 3.7 mEq/L (ref 3.5–5.1)
Sodium: 139 mEq/L (ref 136–145)
Total Bilirubin: 0.28 mg/dL (ref 0.20–1.20)
Total Protein: 7.4 g/dL (ref 6.4–8.3)

## 2012-11-30 NOTE — Progress Notes (Signed)
OFFICE PROGRESS NOTE  CC  Anne Harp, MD [address] [place] [address]  DIAGNOSIS: 67 year old female with new diagnosis of rectal cancer with lung metastasis   PRIOR THERAPY: #17/27/2014 with acute episode of bright red blood per rectum and large clot formation, prompting her to be evaluated at the ED. Of note,colonoscopy prior to this admission had been performed on 03/04/10(screening) which showed three polyps ,largest 8 mm, removed, as well as moderate diverticulosis in the sigmoid colon, otherwise normal exam . On presentation, she had Positive Guaiac and a rectal mass had been palpated on exam. On 7/29 she underwent colonoscopy (Dr. Marina Goodell) which revealed a bulky, circumferential, friable mass lesion in the rectum of about 4 cm above the dentate line, subtotally obstructing and would not permit passage of the colonoscope proximally. Multiple biopsies were taken.  #2 patient's pathology revealed consistent with adenocarcinoma with primary from rectal. Lung biopsy was performed also that revealed metastatic disease.  #3 patient was seen by Dr. Dorris Fetch for possible resection of the lung metastasis. He recommended we proceed with chemotherapy first to see if we could do both lung metastases and with eventual resection.  #4 patient is recommended to proceed with palliative chemotherapy consisting of FOLFOX. We will hold Avastin every 2 rectal bleeding.    CURRENT THERAPY:cycle 1 day 8 of modified FOLFOX 6  INTERVAL HISTORY: Anne [name] 67 y.o. female returns for followup visit prior to her chemotherapy.  She is doing well today.  She tolerated her chemotherapy well with the exception of the fatigue that she is experiencing.  She denies fevers, chills, nausea, vomiting, constipation, diarrhea, numbness, pain or any further concerns.   MEDICAL HISTORY: Past Medical History  Diagnosis Date  . Heart murmur, systolic 07/01/2011  . Postmenopausal HRT (hormone  replacement therapy) 07/01/2011    surgical menopause  . Hx of hyperglycemia 07/01/2011  . Hx of compression fracture of spine 1985    t12 after a  20 foot fall  . Hx of fracture of wrist     after fall   . 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 Hx of colonic polyps   . Early cataract     stoneburner   . High frequency hearing loss     [facility] followed   . Diabetes mellitus without complication   . Cancer     colon  . Skin cancer   . Complication of anesthesia     pt hadpanic attacks with versed, no versed    ALLERGIES:  is allergic to ativan; midazolam; zolpidem tartrate; and neomycin-bacitracin zn-polymyx.  MEDICATIONS:  Current Outpatient Prescriptions  Medication Sig Dispense Refill  . Calcium Carbonate Antacid (TUMS E-X PO) Take 2 tablets by mouth daily.      . Calcium Carbonate-Vitamin D (CALTRATE 600+D PO) Take 1 tablet by mouth daily.       . carbamide peroxide (DEBROX) 6.5 % otic solution Place 5 drops into both ears 2 (two) times daily as needed. Ear wax.      . dexamethasone (DECADRON) 4 MG tablet Take 2 tablets (8 mg total) by mouth 2 (two) times daily with a meal. Take daily starting the day after chemotherapy for 2 days. Take with food.  30 tablet  1  . estradiol (MINIVELLE) 0.0375 MG/24HR Place 1 patch onto the skin 2 (two) times a week. Sundays and Thursdays      . lidocaine-prilocaine (EMLA) cream Apply topically as needed.  30 g  5  . metFORMIN (GLUCOPHAGE) 1000 MG tablet Take 1,000 mg by mouth 2 (two) times daily with a meal.      . METHYLTESTOSTERONE PO Take 1.25 mg by mouth daily.      . Multiple Vitamin (MULTIVITAMIN) tablet Take 1 tablet by mouth daily. Senior      . ondansetron (ZOFRAN ODT) 8 MG disintegrating tablet Take 1 tablet (8 mg total) by mouth every 8 (eight) hours as needed for nausea.  30 tablet  2  . oxyCODONE (ROXICODONE) 5 MG immediate release tablet Take 1-2 tablets (5-10 mg total) by mouth 4 (four) times daily as needed for pain.  30 tablet   0  . polyethylene glycol (MIRALAX / GLYCOLAX) packet Take 17 g by mouth daily.  100 each  0  . Probiotic Product (SOLUBLE FIBER/PROBIOTICS PO) Take 1 tablet by mouth daily.      . sitaGLIPtin (JANUVIA) 100 MG tablet Take 100 mg by mouth daily.       No current facility-administered medications for this visit.    SURGICAL HISTORY:  Past Surgical History  Procedure Laterality Date  . Tonsillectomy  1952  . Adenoidectomy  1952  . Wrist surgery  1957    left reduction  . Compression fraction  1985    T12  . Anterior cruciate ligament repair  1993    right Murphy  . Cosmetic surgery  1999-2007    holderness blepharoplasty facial rhytidectomy mastopexy abdominoplasty   . Dental implant    . Dilation and curettage of uterus  1977  . Total abdominal hysterectomy  2005    with cystocele repair   . Colonoscopy N/A 10/24/2012    Procedure: COLONOSCOPY;  Surgeon: Hilarie Fredrickson, MD;  Location: [facility] ENDOSCOPY;  Service: Endoscopy;  Laterality: N/A;  . Colonoscopy N/A 10/25/2012    Procedure: COLONOSCOPY;  Surgeon: Hilarie Fredrickson, MD;  Location: [facility] ENDOSCOPY;  Service: Endoscopy;  Laterality: N/A;  . Video bronchoscopy Bilateral 10/27/2012    Procedure: VIDEO BRONCHOSCOPY WITH FLUORO;  Surgeon: Coralyn Helling, MD;  Location: [facility] ENDOSCOPY;  Service: Endoscopy;  Laterality: Bilateral;  . Portacath placement Left 11/21/2012    Procedure: INSERTION PORT-A-CATH;  Surgeon: Loreli Slot, MD;  Location: [facility] OR;  Service: Thoracic;  Laterality: Left;    REVIEW OF SYSTEMS:  Pertinent items are noted in HPI.   HEALTH MAINTENANCE:   PHYSICAL EXAMINATION: Blood pressure 176/84, pulse 78, temperature 99.6 F (37.6 C), temperature source Oral, resp. rate 18, height 5' 4.5" (1.638 m), weight 140 lb 9.6 oz (63.776 kg). Body mass index is 23.77 kg/(m^2). General: Patient is a well appearing female in no acute distress HEENT: PERRLA, sclerae anicteric no conjunctival pallor, MMM Neck: supple, no palpable  adenopathy Lungs: clear to auscultation bilaterally, no wheezes, rhonchi, or rales Cardiovascular: regular rate rhythm, S1, S2, no murmurs, rubs or gallops Abdomen: Soft, non-tender, non-distended, normoactive bowel sounds, no HSM Extremities: warm and well perfused, no clubbing, cyanosis, or edema Skin: No rashes or lesions Neuro: Non-focal ECOG PERFORMANCE STATUS: 0 - Asymptomatic  LABORATORY DATA: Lab Results  Component Value Date   WBC 7.2 11/30/2012   HGB 9.3* 11/30/2012   HCT 28.3* 11/30/2012   MCV 77.0* 11/30/2012   PLT 287 11/30/2012      Chemistry      Component Value Date/Time   NA 139 11/30/2012 1337   NA 137 11/18/2012 1528   K 3.7 11/30/2012 1337   K 4.0 11/18/2012 1528   CL 99 11/18/2012 1528  CO2 25 11/30/2012 1337   CO2 28 11/18/2012 1528   BUN 13.1 11/30/2012 1337   BUN 11 11/18/2012 1528   CREATININE 0.7 11/30/2012 1337   CREATININE 0.61 11/18/2012 1528      Component Value Date/Time   CALCIUM 10.2 11/30/2012 1337   CALCIUM 10.3 11/18/2012 1528   ALKPHOS 58 11/30/2012 1337   ALKPHOS 56 11/18/2012 1528   AST 22 11/30/2012 1337   AST 15 11/18/2012 1528   ALT 26 11/30/2012 1337   ALT 15 11/18/2012 1528   BILITOT 0.28 11/30/2012 1337   BILITOT 0.1* 11/18/2012 1528       RADIOGRAPHIC STUDIES:  Dg Chest 2 View Within Previous 72 Hours.  Films Obtained On Friday Are Acceptable For Monday And Tuesday Cases  11/18/2012   CLINICAL DATA:  67 year old female preoperative study. Lung mass. Colon cancer.  EXAM: CHEST  2 VIEW  COMPARISON:  PET-CT 11/02/2012 and earlier.  FINDINGS: 5 cm right lower lobe lung mass re- identified. Radiographically this appears not significantly changed since 10/25/2012. The 6 mm cavitary right upper lobe pulmonary nodule is faint but visible. Stable lung volumes. Stable cardiac size and mediastinal contours. The left lung is clear. No pneumothorax or effusion. Stable visualized osseous structures. Chronic T12 compression fracture. Flowing thoracic osteophytes.   IMPRESSION: Known right lung lesions. No new cardiopulmonary abnormality.   Electronically Signed   By: Augusto Gamble   On: 11/18/2012 15:53   Nm Pet Image Initial (pi) Skull Base To Thigh  11/02/2012   *RADIOLOGY REPORT*  Clinical Data:  Initial treatment strategy for rectal cancer. Circumferential rectal cancer 4 cm from the anal verge.  4 cm right lung mass.  NUCLEAR MEDICINE PET WHOLE BODY  Fasting Blood Glucose:  123  Technique:  19.1 mCi F-18 FDG was injected intravenously. CT data was obtained and used for attenuation correction and anatomic localization only.  (This was not acquired as a diagnostic CT examination.) Additional exam technical data entered on technologist worksheet.  Comparison:  CT scan from 10/25/2012  Findings:  Head/Neck:   No hypermetabolic lymph nodes in the neck.  Chest:  The previously characterized 5 cm right lower lobe pulmonary mass is hypermetabolic with SUV max = 8.  No evidence for hypermetabolic lymphadenopathy in the mediastinum or either hilum. No other hypermetabolic lung lesions are evident although the patient does have bilateral small pulmonary parenchymal nodules (see posterior right upper lobe on image 73 and posterior right lower lobe on image 78).  Both of these small nodules are below the accepted size threshold for reliable resolution on PET imaging.  Abdomen/Pelvis:  No abnormal hypermetabolism is identified within the liver.  No hypermetabolic lymphadenopathy in the abdomen.  There is a prominent amount of physiologic F D G uptake in the colon, but this is associated with hypermetabolic uptake in a region of circumferential rectal wall thickening.  SUV max = 9 for the rectal hypermetabolism.  Skeleton:  No focal hypermetabolic activity to suggest skeletal metastasis.  IMPRESSION: Hypermetabolism in the area of apparent circumferential rectal wall thickening associate with hypermetabolic 5 cm right lower lobe pulmonary mass.  6 mm cavitary pulmonary nodule the  posterior right upper lobe is not hypermetabolic on PET imaging, but is below the accepted size threshold for reliable resolution on PET.  A second tiny right lower lobe nodule is also noted.   Original Report Authenticated By: Kennith Center, M.D.   Dg Fluoro Guide Cv Line-no Report  11/21/2012   CLINICAL DATA: Done in  OR # 10,Intraoperative insertion port a cath   FLOURO GUIDE CV LINE  Fluoroscopy was utilized by the requesting physician.  No radiographic  interpretation.     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 She is in oligo metastases patient is very interested in being aggressive with her disease.She will receive a total of 6 cycles of FOLFOX 6. This will then be followed by radiation with radiosensitizing Xeloda. She will then finish up another 6 cycles of FOLFOX 6.  #2 once she completes this then she will go on to surgery for the lung metastases resection.   PLAN:   #1 Patient is doing well following chemotherapy.  Her labs are stable.  I reviewed them in detail and gave her an iron rich diet handout.    #2 she will be seen in one week's time for followup and her second cycle of FOLFOX 6.    All questions were answered. The patient knows to call the clinic with any problems, questions or concerns. We can certainly see the patient much sooner if necessary.  I spent 25 minutes counseling the patient face to face. The total time spent in the appointment was 30 minutes.  Cherie Ouch Lyn Hollingshead, NP Medical Oncology [facility] Phone: [phone]

## 2012-11-30 NOTE — Patient Instructions (Signed)
° °  Iron-Rich Diet ° °An iron-rich diet contains foods that are good sources of iron. Iron is an important mineral that helps your body produce hemoglobin. Hemoglobin is a protein in red blood cells that carries oxygen to the body's tissues. Sometimes, the iron level in your blood can be low. This may be caused by: °· A lack of iron in your diet. °· Blood loss. °· Times of growth, such as during pregnancy or during a child's growth and development. °Low levels of iron can cause a decrease in the number of red blood cells. This can result in iron deficiency anemia. Iron deficiency anemia symptoms include: °· Tiredness. °· Weakness. °· Irritability. °· Increased chance of infection. °Here are some recommendations for daily iron intake: °· Males older than 69 years of age need 8 mg of iron per day. °· Women ages 19 to 50 need 18 mg of iron per day. °· Pregnant women need 27 mg of iron per day, and women who are over 19 years of age and breastfeeding need 9 mg of iron per day. °· Women over the age of 50 need 8 mg of iron per day. °SOURCES OF IRON °There are 2 types of iron that are found in food: heme iron and nonheme iron. Heme iron is absorbed by the body better than nonheme iron. Heme iron is found in meat, poultry, and fish. Nonheme iron is found in grains, beans, and vegetables. °Heme Iron Sources °Food / Iron (mg) °· Chicken liver, 3 oz (85 g)/ 10 mg °· Beef liver, 3 oz (85 g)/ 5.5 mg °· Oysters, 3 oz (85 g)/ 8 mg °· Beef, 3 oz (85 g)/ 2 to 3 mg °· Shrimp, 3 oz (85 g)/ 2.8 mg °· Turkey, 3 oz (85 g)/ 2 mg °· Chicken, 3 oz (85 g) / 1 mg °· Fish (tuna, halibut), 3 oz (85 g)/ 1 mg °· Pork, 3 oz (85 g)/ 0.9 mg °Nonheme Iron Sources °Food / Iron (mg) °· Ready-to-eat breakfast cereal, iron-fortified / 3.9 to 7 mg °· Tofu, ½ cup / 3.4 mg °· Kidney beans, ½ cup / 2.6 mg °· Baked potato with skin / 2.7 mg °· Asparagus, ½ cup / 2.2 mg °· Avocado / 2 mg °· Dried peaches, ½ cup / 1.6 mg °· Raisins, ½ cup / 1.5 mg °· Soy milk,  1 cup / 1.5 mg °· Whole-wheat bread, 1 slice / 1.2 mg °· Spinach, 1 cup / 0.8 mg °· Broccoli, ½ cup / 0.6 mg °IRON ABSORPTION °Certain foods can decrease the body's absorption of iron. Try to avoid these foods and beverages while eating meals with iron-containing foods: °· Coffee. °· Tea. °· Fiber. °· Soy. °Foods containing vitamin C can help increase the amount of iron your body absorbs from iron sources, especially from nonheme sources. Eat foods with vitamin C along with iron-containing foods to increase your iron absorption. Foods that are high in vitamin C include many fruits and vegetables. Some good sources are: °· Fresh orange juice. °· Oranges. °· Strawberries. °· Mangoes. °· Grapefruit. °· Red bell peppers. °· Green bell peppers. °· Broccoli. °· Potatoes with skin. °· Tomato juice. °Document Released: 10/28/2004 Document Revised: 06/08/2011 Document Reviewed: 09/04/2010 °ExitCare® Patient Information ©2014 [employer]. ° °

## 2012-12-01 ENCOUNTER — Telehealth: Payer: Self-pay | Admitting: Medical Oncology

## 2012-12-01 ENCOUNTER — Ambulatory Visit (HOSPITAL_COMMUNITY)
Admission: RE | Admit: 2012-12-01 | Discharge: 2012-12-01 | Disposition: A | Discharge status: Home / Self Care | Payer: 59 | Source: Ambulatory Visit | Attending: Adult Health | Admitting: Adult Health

## 2012-12-01 DIAGNOSIS — R0781 Pleurodynia: Secondary | ICD-10-CM

## 2012-12-01 DIAGNOSIS — R0789 Other chest pain: Secondary | ICD-10-CM | POA: Insufficient documentation

## 2012-12-01 DIAGNOSIS — R222 Localized swelling, mass and lump, trunk: Secondary | ICD-10-CM | POA: Insufficient documentation

## 2012-12-01 DIAGNOSIS — J984 Other disorders of lung: Secondary | ICD-10-CM | POA: Insufficient documentation

## 2012-12-01 LAB — CEA: CEA: 7.1 ng/mL — ABNORMAL HIGH (ref 0.0–5.0)

## 2012-12-01 NOTE — Telephone Encounter (Signed)
Patient call reporting a cough that started late afternoon yesterday "pleuretic" and pain to "right lower side a 5/6" on pain scale. Reviewed with NP, patient to have chest xray. Patient verbalized understanding. Knows to call office with questions or concerns.

## 2012-12-06 ENCOUNTER — Ambulatory Visit (HOSPITAL_BASED_OUTPATIENT_CLINIC_OR_DEPARTMENT_OTHER): Payer: 59 | Admitting: Oncology

## 2012-12-06 ENCOUNTER — Ambulatory Visit (HOSPITAL_BASED_OUTPATIENT_CLINIC_OR_DEPARTMENT_OTHER): Payer: 59

## 2012-12-06 ENCOUNTER — Other Ambulatory Visit (HOSPITAL_BASED_OUTPATIENT_CLINIC_OR_DEPARTMENT_OTHER): Payer: 59 | Admitting: Lab

## 2012-12-06 VITALS — BP 123/88 | HR 75 | Temp 97.9°F | Resp 18 | Ht 64.0 in | Wt 133.8 lb

## 2012-12-06 DIAGNOSIS — C7800 Secondary malignant neoplasm of unspecified lung: Secondary | ICD-10-CM

## 2012-12-06 DIAGNOSIS — C20 Malignant neoplasm of rectum: Secondary | ICD-10-CM

## 2012-12-06 DIAGNOSIS — R05 Cough: Secondary | ICD-10-CM

## 2012-12-06 DIAGNOSIS — Z5111 Encounter for antineoplastic chemotherapy: Secondary | ICD-10-CM

## 2012-12-06 LAB — CBC WITH DIFFERENTIAL/PLATELET
EOS%: 1.5 % (ref 0.0–7.0)
Eosinophils Absolute: 0.1 10*3/uL (ref 0.0–0.5)
LYMPH%: 18.9 % (ref 14.0–49.7)
MCH: 24.5 pg — ABNORMAL LOW (ref 25.1–34.0)
MCV: 75.5 fL — ABNORMAL LOW (ref 79.5–101.0)
MONO%: 10.7 % (ref 0.0–14.0)
NEUT#: 6 10*3/uL (ref 1.5–6.5)
Platelets: 395 10*3/uL (ref 145–400)
RBC: 4.28 10*6/uL (ref 3.70–5.45)

## 2012-12-06 LAB — COMPREHENSIVE METABOLIC PANEL (CC13)
Alkaline Phosphatase: 62 U/L (ref 40–150)
BUN: 11.9 mg/dL (ref 7.0–26.0)
Glucose: 146 mg/dl — ABNORMAL HIGH (ref 70–140)
Sodium: 134 mEq/L — ABNORMAL LOW (ref 136–145)
Total Bilirubin: 0.43 mg/dL (ref 0.20–1.20)
Total Protein: 7.9 g/dL (ref 6.4–8.3)

## 2012-12-06 MED ORDER — DEXAMETHASONE SODIUM PHOSPHATE 10 MG/ML IJ SOLN
INTRAMUSCULAR | Status: AC
  Filled 2012-12-06: qty 1

## 2012-12-06 MED ORDER — DEXAMETHASONE SODIUM PHOSPHATE 10 MG/ML IJ SOLN
10.0000 mg | Freq: Once | INTRAMUSCULAR | Status: AC
  Administered 2012-12-06: 10 mg via INTRAVENOUS

## 2012-12-06 MED ORDER — OXYCODONE HCL 5 MG PO TABS: 5.0000 mg | ORAL_TABLET | Freq: Four times a day (QID) | ORAL | Status: DC | PRN

## 2012-12-06 MED ORDER — FLUOROURACIL CHEMO INJECTION 2.5 GM/50ML
400.0000 mg/m2 | Freq: Once | INTRAVENOUS | Status: AC
  Administered 2012-12-06: 700 mg via INTRAVENOUS
  Filled 2012-12-06: qty 14

## 2012-12-06 MED ORDER — ONDANSETRON 8 MG/NS 50 ML IVPB
INTRAVENOUS | Status: AC
  Filled 2012-12-06: qty 8

## 2012-12-06 MED ORDER — SODIUM CHLORIDE 0.9 % IJ SOLN
10.0000 mL | INTRAMUSCULAR | Status: DC | PRN
  Filled 2012-12-06: qty 10

## 2012-12-06 MED ORDER — SODIUM CHLORIDE 0.9 % IV SOLN
2400.0000 mg/m2 | INTRAVENOUS | Status: DC
  Administered 2012-12-06: 4100 mg via INTRAVENOUS
  Filled 2012-12-06: qty 82

## 2012-12-06 MED ORDER — ONDANSETRON 16 MG/50ML IVPB (CHCC)
INTRAVENOUS | Status: AC
  Filled 2012-12-06: qty 16

## 2012-12-06 MED ORDER — HEPARIN SOD (PORK) LOCK FLUSH 100 UNIT/ML IV SOLN
250.0000 [IU] | Freq: Once | INTRAVENOUS | Status: DC | PRN
  Filled 2012-12-06: qty 5

## 2012-12-06 MED ORDER — HEPARIN SOD (PORK) LOCK FLUSH 100 UNIT/ML IV SOLN
500.0000 [IU] | Freq: Once | INTRAVENOUS | Status: DC | PRN
  Filled 2012-12-06: qty 5

## 2012-12-06 MED ORDER — ONDANSETRON 8 MG/50ML IVPB (CHCC)
8.0000 mg | Freq: Once | INTRAVENOUS | Status: AC
  Administered 2012-12-06: 8 mg via INTRAVENOUS

## 2012-12-06 MED ORDER — OXALIPLATIN CHEMO INJECTION 100 MG/20ML
85.0000 mg/m2 | Freq: Once | INTRAVENOUS | Status: AC
  Administered 2012-12-06: 145 mg via INTRAVENOUS
  Filled 2012-12-06: qty 29

## 2012-12-06 MED ORDER — LEUCOVORIN CALCIUM INJECTION 350 MG
400.0000 mg/m2 | Freq: Once | INTRAVENOUS | Status: AC
  Administered 2012-12-06: 700 mg via INTRAVENOUS
  Filled 2012-12-06: qty 35

## 2012-12-06 NOTE — Patient Instructions (Signed)
[facility] Discharge Instructions for Patients Receiving Chemotherapy  Today you received the following chemotherapy agent  FOLFOX E   To help prevent nausea and vomiting after your treatment, we encourage you to take your nausea medication  If you develop nausea and vomiting that is not controlled by your nausea medication, call the clinic.   BELOW ARE SYMPTOMS THAT SHOULD BE REPORTED IMMEDIATELY:  *FEVER GREATER THAN 100.5 F  *CHILLS WITH OR WITHOUT FEVER  NAUSEA AND VOMITING THAT IS NOT CONTROLLED WITH YOUR NAUSEA MEDICATION  *UNUSUAL SHORTNESS OF BREATH  *UNUSUAL BRUISING OR BLEEDING  TENDERNESS IN MOUTH AND THROAT WITH OR WITHOUT PRESENCE OF ULCERS  *URINARY PROBLEMS  *BOWEL PROBLEMS  UNUSUAL RASH Items with * indicate a potential emergency and should be followed up as soon as possible.  Feel free to call the clinic you have any questions or concerns. The clinic phone number is [phone].

## 2012-12-06 NOTE — Patient Instructions (Addendum)
Proceed with chest x-ray today for the cough  Proceed with chemotherapy today

## 2012-12-08 ENCOUNTER — Ambulatory Visit (HOSPITAL_BASED_OUTPATIENT_CLINIC_OR_DEPARTMENT_OTHER): Payer: 59

## 2012-12-08 VITALS — BP 119/89 | HR 98 | Temp 98.9°F

## 2012-12-08 DIAGNOSIS — C7800 Secondary malignant neoplasm of unspecified lung: Secondary | ICD-10-CM

## 2012-12-08 DIAGNOSIS — C20 Malignant neoplasm of rectum: Secondary | ICD-10-CM

## 2012-12-08 MED ORDER — HEPARIN SOD (PORK) LOCK FLUSH 100 UNIT/ML IV SOLN
500.0000 [IU] | Freq: Once | INTRAVENOUS | Status: AC | PRN
  Administered 2012-12-08: 500 [IU]
  Filled 2012-12-08: qty 5

## 2012-12-08 MED ORDER — SODIUM CHLORIDE 0.9 % IJ SOLN
10.0000 mL | INTRAMUSCULAR | Status: DC | PRN
  Administered 2012-12-08: 10 mL
  Filled 2012-12-08: qty 10

## 2012-12-13 ENCOUNTER — Ambulatory Visit (HOSPITAL_COMMUNITY)
Admission: RE | Admit: 2012-12-13 | Discharge: 2012-12-13 | Disposition: A | Discharge status: Home / Self Care | Payer: 59 | Source: Ambulatory Visit | Attending: Oncology | Admitting: Oncology

## 2012-12-13 ENCOUNTER — Other Ambulatory Visit (HOSPITAL_BASED_OUTPATIENT_CLINIC_OR_DEPARTMENT_OTHER): Payer: 59

## 2012-12-13 ENCOUNTER — Telehealth: Payer: Self-pay | Admitting: *Deleted

## 2012-12-13 ENCOUNTER — Encounter (HOSPITAL_COMMUNITY): Payer: Self-pay

## 2012-12-13 ENCOUNTER — Encounter: Payer: Self-pay | Admitting: Oncology

## 2012-12-13 ENCOUNTER — Ambulatory Visit (HOSPITAL_BASED_OUTPATIENT_CLINIC_OR_DEPARTMENT_OTHER): Payer: 59 | Admitting: Oncology

## 2012-12-13 ENCOUNTER — Ambulatory Visit (HOSPITAL_BASED_OUTPATIENT_CLINIC_OR_DEPARTMENT_OTHER): Payer: 59

## 2012-12-13 VITALS — BP 160/76 | HR 108 | Temp 98.5°F | Resp 18 | Ht 64.0 in | Wt 132.2 lb

## 2012-12-13 DIAGNOSIS — R52 Pain, unspecified: Secondary | ICD-10-CM

## 2012-12-13 DIAGNOSIS — E860 Dehydration: Secondary | ICD-10-CM

## 2012-12-13 DIAGNOSIS — Z79899 Other long term (current) drug therapy: Secondary | ICD-10-CM | POA: Insufficient documentation

## 2012-12-13 DIAGNOSIS — R0602 Shortness of breath: Secondary | ICD-10-CM | POA: Insufficient documentation

## 2012-12-13 DIAGNOSIS — R918 Other nonspecific abnormal finding of lung field: Secondary | ICD-10-CM

## 2012-12-13 DIAGNOSIS — J90 Pleural effusion, not elsewhere classified: Secondary | ICD-10-CM | POA: Insufficient documentation

## 2012-12-13 DIAGNOSIS — R000 Tachycardia, unspecified: Secondary | ICD-10-CM | POA: Insufficient documentation

## 2012-12-13 DIAGNOSIS — C20 Malignant neoplasm of rectum: Secondary | ICD-10-CM

## 2012-12-13 DIAGNOSIS — M8448XA Pathological fracture, other site, initial encounter for fracture: Secondary | ICD-10-CM | POA: Insufficient documentation

## 2012-12-13 DIAGNOSIS — C7800 Secondary malignant neoplasm of unspecified lung: Secondary | ICD-10-CM

## 2012-12-13 DIAGNOSIS — R079 Chest pain, unspecified: Secondary | ICD-10-CM

## 2012-12-13 DIAGNOSIS — R05 Cough: Secondary | ICD-10-CM | POA: Insufficient documentation

## 2012-12-13 DIAGNOSIS — R509 Fever, unspecified: Secondary | ICD-10-CM | POA: Insufficient documentation

## 2012-12-13 DIAGNOSIS — R059 Cough, unspecified: Secondary | ICD-10-CM | POA: Insufficient documentation

## 2012-12-13 LAB — COMPREHENSIVE METABOLIC PANEL (CC13)
ALT: 18 U/L (ref 0–55)
Albumin: 2.8 g/dL — ABNORMAL LOW (ref 3.5–5.0)
BUN: 7.2 mg/dL (ref 7.0–26.0)
CO2: 30 mEq/L — ABNORMAL HIGH (ref 22–29)
Calcium: 10.2 mg/dL (ref 8.4–10.4)
Chloride: 92 mEq/L — ABNORMAL LOW (ref 98–109)
Creatinine: 0.6 mg/dL (ref 0.6–1.1)

## 2012-12-13 LAB — CBC WITH DIFFERENTIAL/PLATELET
Basophils Absolute: 0 10*3/uL (ref 0.0–0.1)
HCT: 28.3 % — ABNORMAL LOW (ref 34.8–46.6)
HGB: 9.3 g/dL — ABNORMAL LOW (ref 11.6–15.9)
MONO#: 0.3 10*3/uL (ref 0.1–0.9)
NEUT#: 9.6 10*3/uL — ABNORMAL HIGH (ref 1.5–6.5)
NEUT%: 85.2 % — ABNORMAL HIGH (ref 38.4–76.8)
WBC: 11.2 10*3/uL — ABNORMAL HIGH (ref 3.9–10.3)
lymph#: 1.3 10*3/uL (ref 0.9–3.3)

## 2012-12-13 MED ORDER — SODIUM CHLORIDE 0.9 % IV SOLN
Freq: Once | INTRAVENOUS | Status: AC
  Administered 2012-12-13: 10:00:00 via INTRAVENOUS

## 2012-12-13 MED ORDER — MEGESTROL ACETATE 625 MG/5ML PO SUSP: 625.0000 mg | Freq: Every day | ORAL | Status: DC

## 2012-12-13 MED ORDER — IOHEXOL 350 MG/ML SOLN
100.0000 mL | Freq: Once | INTRAVENOUS | Status: AC | PRN
  Administered 2012-12-13: 100 mL via INTRAVENOUS

## 2012-12-13 MED ORDER — HEPARIN SOD (PORK) LOCK FLUSH 100 UNIT/ML IV SOLN
500.0000 [IU] | Freq: Once | INTRAVENOUS | Status: AC
  Administered 2012-12-13: 500 [IU] via INTRAVENOUS
  Filled 2012-12-13: qty 5

## 2012-12-13 MED ORDER — SODIUM CHLORIDE 0.9 % IJ SOLN
10.0000 mL | INTRAMUSCULAR | Status: DC | PRN
  Administered 2012-12-13: 10 mL via INTRAVENOUS
  Filled 2012-12-13: qty 10

## 2012-12-13 MED ORDER — DEXAMETHASONE 4 MG PO TABS: 8.0000 mg | ORAL_TABLET | Freq: Two times a day (BID) | ORAL | Status: DC

## 2012-12-13 NOTE — Patient Instructions (Addendum)
CT angiogram to rule out a pulmonary embolism IV fluids today for dehydration  I have prescribed Megace for appetite I will see you back in one week's time for followup and next cycle of chemotherapy.

## 2012-12-13 NOTE — Telephone Encounter (Signed)
appts made and printed. Pt will go for her CT after her ivf today. im working on Product manager...td

## 2012-12-13 NOTE — Progress Notes (Signed)
PAC accessed with power port needle in anticipation of CT scan today.  Discussed with Caryn Bee in radiology Pt. Discharged at 12:24 with sister to [facility] for CT scan later today.

## 2012-12-13 NOTE — Patient Instructions (Addendum)
Dehydration, Adult Dehydration is when you lose more fluids from the body than you take in. Vital organs like the kidneys, brain, and heart cannot function without a proper amount of fluids and salt. Any loss of fluids from the body can cause dehydration.  CAUSES   Vomiting.  Diarrhea.  Excessive sweating.  Excessive urine output.  Fever. SYMPTOMS  Mild dehydration  Thirst.  Dry lips.  Slightly dry mouth. Moderate dehydration  Very dry mouth.  Sunken eyes.  Skin does not bounce back quickly when lightly pinched and released.  Dark urine and decreased urine production.  Decreased tear production.  Headache. Severe dehydration  Very dry mouth.  Extreme thirst.  Rapid, weak pulse (more than 100 beats per minute at rest).  Cold hands and feet.  Not able to sweat in spite of heat and temperature.  Rapid breathing.  Blue lips.  Confusion and lethargy.  Difficulty being awakened.  Minimal urine production.  No tears. DIAGNOSIS  Your caregiver will diagnose dehydration based on your symptoms and your exam. Blood and urine tests will help confirm the diagnosis. The diagnostic evaluation should also identify the cause of dehydration. TREATMENT  Treatment of mild or moderate dehydration can often be done at home by increasing the amount of fluids that you drink. It is best to drink small amounts of fluid more often. Drinking too much at one time can make vomiting worse. Refer to the home care instructions below. Severe dehydration needs to be treated at the hospital where you will probably be given intravenous (IV) fluids that contain water and electrolytes. HOME CARE INSTRUCTIONS   Ask your caregiver about specific rehydration instructions.  Drink enough fluids to keep your urine clear or pale yellow.  Drink small amounts frequently if you have nausea and vomiting.  Eat as you normally do.  Avoid:  Foods or drinks high in sugar.  Carbonated  drinks.  Juice.  Extremely hot or cold fluids.  Drinks with caffeine.  Fatty, greasy foods.  Alcohol.  Tobacco.  Overeating.  Gelatin desserts.  Wash your hands well to avoid spreading bacteria and viruses.  Only take over-the-counter or prescription medicines for pain, discomfort, or fever as directed by your caregiver.  Ask your caregiver if you should continue all prescribed and over-the-counter medicines.  Keep all follow-up appointments with your caregiver. SEEK MEDICAL CARE IF:  You have abdominal pain and it increases or stays in one area (localizes).  You have a rash, stiff neck, or severe headache.  You are irritable, sleepy, or difficult to awaken.  You are weak, dizzy, or extremely thirsty. SEEK IMMEDIATE MEDICAL CARE IF:   You are unable to keep fluids down or you get worse despite treatment.  You have frequent episodes of vomiting or diarrhea.  You have blood or green matter (bile) in your vomit.  You have blood in your stool or your stool looks black and tarry.  You have not urinated in 6 to 8 hours, or you have only urinated a small amount of very dark urine.  You have a fever.  You faint. MAKE SURE YOU:   Understand these instructions.  Will watch your condition.  Will get help right away if you are not doing well or get worse. Document Released: 03/16/2005 Document Revised: 06/08/2011 Document Reviewed: 11/03/2010 ExitCare Patient Information 2014 [employer].  

## 2012-12-13 NOTE — Progress Notes (Signed)
Neilsa RN informed us/CT to deaccess P-PAC and to send pt. Home after her CT scan

## 2012-12-15 ENCOUNTER — Other Ambulatory Visit: Payer: Self-pay | Admitting: Adult Health

## 2012-12-15 ENCOUNTER — Telehealth: Payer: Self-pay | Admitting: *Deleted

## 2012-12-15 ENCOUNTER — Other Ambulatory Visit: Payer: Self-pay | Admitting: Emergency Medicine

## 2012-12-15 DIAGNOSIS — C20 Malignant neoplasm of rectum: Secondary | ICD-10-CM

## 2012-12-15 MED ORDER — OXYCODONE HCL 5 MG PO TABS: 5.0000 mg | ORAL_TABLET | Freq: Four times a day (QID) | ORAL | Status: DC | PRN

## 2012-12-15 NOTE — Telephone Encounter (Signed)
PT.'S COUGH IS SLIGHTLY IMPROVED BUT STILL COUGHING UP WHITE SPUTUM. HER TEMPERATURE IS NORMAL. SHE IS VERY FATIGUED. PT.'S CHEST PAIN IS AT A SCALE OF SEVEN. THE OXYCODONE 5MG  DECREASES THE PAIN TO A TWO. SHE HAS TO USE HER PAIN MEDICATION TO SLEEP. PT. NEEDS A NEW PRESCRIPTION FOR THE OXYCODONE. PT. WANTS TO KNOW THE FUTURE PLAN. THIS NOTE TO DR.KHAN'S NURSE, MEREDITH WALTON,RN.

## 2012-12-16 ENCOUNTER — Inpatient Hospital Stay (HOSPITAL_COMMUNITY): Payer: 59

## 2012-12-16 ENCOUNTER — Inpatient Hospital Stay (HOSPITAL_COMMUNITY)
Admission: AD | Admit: 2012-12-16 | Discharge: 2012-12-27 | DRG: 163 | Disposition: A | Discharge status: Home / Self Care | Payer: 59 | Source: Ambulatory Visit | Attending: Thoracic Surgery (Cardiothoracic Vascular Surgery) | Admitting: Thoracic Surgery (Cardiothoracic Vascular Surgery)

## 2012-12-16 ENCOUNTER — Other Ambulatory Visit (HOSPITAL_BASED_OUTPATIENT_CLINIC_OR_DEPARTMENT_OTHER): Payer: 59 | Admitting: Lab

## 2012-12-16 ENCOUNTER — Encounter (HOSPITAL_COMMUNITY): Payer: Self-pay

## 2012-12-16 ENCOUNTER — Ambulatory Visit
Admit: 2012-12-16 | Discharge: 2012-12-16 | Disposition: A | Discharge status: Home / Self Care | Payer: 59 | Attending: Radiation Oncology | Admitting: Radiation Oncology

## 2012-12-16 ENCOUNTER — Encounter: Payer: Self-pay | Admitting: Oncology

## 2012-12-16 ENCOUNTER — Encounter: Payer: 59 | Admitting: Oncology

## 2012-12-16 VITALS — BP 144/80 | HR 109 | Temp 98.2°F | Resp 20 | Ht 64.0 in | Wt 138.7 lb

## 2012-12-16 DIAGNOSIS — C20 Malignant neoplasm of rectum: Secondary | ICD-10-CM

## 2012-12-16 DIAGNOSIS — K5900 Constipation, unspecified: Secondary | ICD-10-CM | POA: Diagnosis present

## 2012-12-16 DIAGNOSIS — I369 Nonrheumatic tricuspid valve disorder, unspecified: Secondary | ICD-10-CM

## 2012-12-16 DIAGNOSIS — E119 Type 2 diabetes mellitus without complications: Secondary | ICD-10-CM | POA: Diagnosis present

## 2012-12-16 DIAGNOSIS — R918 Other nonspecific abnormal finding of lung field: Secondary | ICD-10-CM

## 2012-12-16 DIAGNOSIS — D649 Anemia, unspecified: Secondary | ICD-10-CM

## 2012-12-16 DIAGNOSIS — E871 Hypo-osmolality and hyponatremia: Secondary | ICD-10-CM | POA: Diagnosis present

## 2012-12-16 DIAGNOSIS — R079 Chest pain, unspecified: Secondary | ICD-10-CM

## 2012-12-16 DIAGNOSIS — R627 Adult failure to thrive: Secondary | ICD-10-CM | POA: Diagnosis present

## 2012-12-16 DIAGNOSIS — J869 Pyothorax without fistula: Secondary | ICD-10-CM | POA: Diagnosis present

## 2012-12-16 DIAGNOSIS — D62 Acute posthemorrhagic anemia: Secondary | ICD-10-CM | POA: Diagnosis not present

## 2012-12-16 DIAGNOSIS — J910 Malignant pleural effusion: Secondary | ICD-10-CM | POA: Diagnosis present

## 2012-12-16 DIAGNOSIS — Z79890 Hormone replacement therapy (postmenopausal): Secondary | ICD-10-CM

## 2012-12-16 DIAGNOSIS — C7800 Secondary malignant neoplasm of unspecified lung: Secondary | ICD-10-CM

## 2012-12-16 DIAGNOSIS — J90 Pleural effusion, not elsewhere classified: Secondary | ICD-10-CM

## 2012-12-16 DIAGNOSIS — J9819 Other pulmonary collapse: Secondary | ICD-10-CM | POA: Diagnosis present

## 2012-12-16 LAB — CREATININE, SERUM: Creatinine, Ser: 0.39 mg/dL — ABNORMAL LOW (ref 0.50–1.10)

## 2012-12-16 LAB — CBC WITH DIFFERENTIAL/PLATELET
BASO%: 0.1 % (ref 0.0–2.0)
Basophils Absolute: 0 10e3/uL (ref 0.0–0.1)
EOS%: 0.1 % (ref 0.0–7.0)
Eosinophils Absolute: 0 10e3/uL (ref 0.0–0.5)
HCT: 29.3 % — ABNORMAL LOW (ref 34.8–46.6)
HGB: 9.8 g/dL — ABNORMAL LOW (ref 11.6–15.9)
LYMPH%: 6.1 % — ABNORMAL LOW (ref 14.0–49.7)
MCH: 24.4 pg — ABNORMAL LOW (ref 25.1–34.0)
MCHC: 33.4 g/dL (ref 31.5–36.0)
MCV: 73.1 fL — ABNORMAL LOW (ref 79.5–101.0)
MONO#: 1.2 10e3/uL — ABNORMAL HIGH (ref 0.1–0.9)
MONO%: 10.8 % (ref 0.0–14.0)
NEUT#: 9 10e3/uL — ABNORMAL HIGH (ref 1.5–6.5)
NEUT%: 82.9 % — ABNORMAL HIGH (ref 38.4–76.8)
Platelets: 391 10e3/uL (ref 145–400)
RBC: 4.01 10e6/uL (ref 3.70–5.45)
RDW: 16.3 % — ABNORMAL HIGH (ref 11.2–14.5)
WBC: 10.9 10e3/uL — ABNORMAL HIGH (ref 3.9–10.3)
lymph#: 0.7 10e3/uL — ABNORMAL LOW (ref 0.9–3.3)

## 2012-12-16 LAB — BODY FLUID CELL COUNT WITH DIFFERENTIAL
Eos, Fluid: 0 %
Monocyte-Macrophage-Serous Fluid: 2 % — ABNORMAL LOW (ref 50–90)
Neutrophil Count, Fluid: 91 % — ABNORMAL HIGH (ref 0–25)
Total Nucleated Cell Count, Fluid: UNDETERMINED cu mm (ref 0–1000)

## 2012-12-16 LAB — COMPREHENSIVE METABOLIC PANEL (CC13)
ALT: 15 U/L (ref 0–55)
AST: 16 U/L (ref 5–34)
Albumin: 2.2 g/dL — ABNORMAL LOW (ref 3.5–5.0)
Alkaline Phosphatase: 74 U/L (ref 40–150)
BUN: 11.6 mg/dL (ref 7.0–26.0)
CO2: 26 meq/L (ref 22–29)
Calcium: 9.5 mg/dL (ref 8.4–10.4)
Chloride: 85 meq/L — ABNORMAL LOW (ref 98–109)
Creatinine: 0.6 mg/dL (ref 0.6–1.1)
Glucose: 294 mg/dL — ABNORMAL HIGH (ref 70–140)
Potassium: 4.2 meq/L (ref 3.5–5.1)
Sodium: 121 meq/L — ABNORMAL LOW (ref 136–145)
Total Bilirubin: 0.39 mg/dL (ref 0.20–1.20)
Total Protein: 6.2 g/dL — ABNORMAL LOW (ref 6.4–8.3)

## 2012-12-16 LAB — CBC
MCH: 23.7 pg — ABNORMAL LOW (ref 26.0–34.0)
MCHC: 32.9 g/dL (ref 30.0–36.0)
Platelets: 396 10*3/uL (ref 150–400)
RDW: 15.3 % (ref 11.5–15.5)

## 2012-12-16 LAB — GLUCOSE, SEROUS FLUID

## 2012-12-16 LAB — LACTATE DEHYDROGENASE, PLEURAL OR PERITONEAL FLUID: LD, Fluid: 2509 U/L — ABNORMAL HIGH (ref 3–23)

## 2012-12-16 LAB — PROTEIN, BODY FLUID

## 2012-12-16 LAB — PROTIME-INR
INR: 1.2 — ABNORMAL LOW (ref 2.00–3.50)
Protime: 14.4 s — ABNORMAL HIGH (ref 10.6–13.4)

## 2012-12-16 MED ORDER — OXYCODONE HCL 5 MG PO TABS
10.0000 mg | ORAL_TABLET | Freq: Four times a day (QID) | ORAL | Status: DC | PRN
  Administered 2012-12-16 – 2012-12-17 (×2): 10 mg via ORAL
  Filled 2012-12-16 (×2): qty 2

## 2012-12-16 MED ORDER — ONDANSETRON HCL 4 MG/2ML IJ SOLN
4.0000 mg | Freq: Three times a day (TID) | INTRAMUSCULAR | Status: DC | PRN
  Administered 2012-12-16 – 2012-12-18 (×2): 4 mg via INTRAVENOUS
  Filled 2012-12-16: qty 2

## 2012-12-16 MED ORDER — IOHEXOL 300 MG/ML  SOLN
100.0000 mL | Freq: Once | INTRAMUSCULAR | Status: AC | PRN
  Administered 2012-12-16: 100 mL via INTRAVENOUS

## 2012-12-16 MED ORDER — OXYCODONE HCL 5 MG PO TABS
5.0000 mg | ORAL_TABLET | Freq: Four times a day (QID) | ORAL | Status: DC | PRN
  Filled 2012-12-16: qty 1

## 2012-12-16 MED ORDER — GUAIFENESIN-DM 100-10 MG/5ML PO SYRP
5.0000 mL | ORAL_SOLUTION | ORAL | Status: DC | PRN
  Administered 2012-12-16 – 2012-12-20 (×3): 5 mL via ORAL
  Filled 2012-12-16 (×3): qty 10

## 2012-12-16 MED ORDER — ENOXAPARIN SODIUM 40 MG/0.4ML ~~LOC~~ SOLN
40.0000 mg | SUBCUTANEOUS | Status: DC
  Administered 2012-12-16 – 2012-12-19 (×4): 40 mg via SUBCUTANEOUS
  Filled 2012-12-16 (×6): qty 0.4

## 2012-12-16 MED ORDER — ONDANSETRON HCL 4 MG/2ML IJ SOLN
INTRAMUSCULAR | Status: AC
  Filled 2012-12-16: qty 2

## 2012-12-16 MED ORDER — SODIUM CHLORIDE 0.9 % IV SOLN
INTRAVENOUS | Status: DC
  Administered 2012-12-16 – 2012-12-21 (×7): via INTRAVENOUS

## 2012-12-16 NOTE — Progress Notes (Signed)
12/16/12 1111  Patient is currently NPO for CT of Abdomen.

## 2012-12-16 NOTE — Consult Note (Signed)
Reason for Consult: Right pleural effusion Referring Physician: Dr. Marvell Fuller is an 67 y.o. female.  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 I have seen her before regarding her lung metastasis. She had undergone 2 cycles of chemotherapy. Over the weekend she developed right sided pleuritic CP. Initially this was anterior but after a severe coughing spell it moved to her right side and back. She had a CT done on 9/16 which showed the right lung met had increased in size. There was a new small right effusion. Today she became more SOB and a repeat CXR showed a large pleural effusion. A repeat CT showed the rigth chest was essentially completely filled with a loculated pleural effusion. There was extensive compressive atelectasis.  Past Medical History  Diagnosis Date  . Heart murmur, systolic 07/01/2011  . Postmenopausal HRT (hormone replacement therapy) 07/01/2011    surgical menopause  . Hx of hyperglycemia 07/01/2011  . Hx of compression fracture of spine 1985    t12 after a  20 foot fall  . Hx of fracture of wrist     after fall   . 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 Hx of colonic polyps   . Early cataract     stoneburner   . High frequency hearing loss     [facility] followed   . Diabetes mellitus without complication   . Cancer     colon  . Skin cancer   . Complication of anesthesia     pt hadpanic attacks with versed, no versed    Past Surgical History  Procedure Laterality Date  . Tonsillectomy  1952  . Adenoidectomy  1952  . Wrist surgery  1957    left reduction  . Compression fraction  1985    T12  . Anterior cruciate ligament repair  1993    right Murphy  . Cosmetic surgery  1999-2007    holderness blepharoplasty facial rhytidectomy mastopexy abdominoplasty   . Dental implant    . Dilation and curettage of uterus  1977  . Total abdominal hysterectomy  2005    with cystocele repair   . Colonoscopy N/A 10/24/2012    Procedure: COLONOSCOPY;   Surgeon: Hilarie Fredrickson, MD;  Location: [facility] ENDOSCOPY;  Service: Endoscopy;  Laterality: N/A;  . Colonoscopy N/A 10/25/2012    Procedure: COLONOSCOPY;  Surgeon: Hilarie Fredrickson, MD;  Location: [facility] ENDOSCOPY;  Service: Endoscopy;  Laterality: N/A;  . Video bronchoscopy Bilateral 10/27/2012    Procedure: VIDEO BRONCHOSCOPY WITH FLUORO;  Surgeon: Coralyn Helling, MD;  Location: [facility] ENDOSCOPY;  Service: Endoscopy;  Laterality: Bilateral;  . Portacath placement Left 11/21/2012    Procedure: INSERTION PORT-A-CATH;  Surgeon: Loreli Slot, MD;  Location: [facility] OR;  Service: Thoracic;  Laterality: Left;    Family History  Problem Relation Age of Onset  . Diabetes Father   . Cancer Father     oral  . Thyroid disease Father   . Heart attack Father     died age 69  . Dementia Mother   . Hypertension Mother   . Hyperlipidemia Mother   . Cancer Brother     retro lymphosarcoma agent orange  . Cancer Sister     clear cell ovarina and breast insitu  . Obesity      siblings   . Diabetes Brother   . Alcohol abuse Brother     hx of  . Other  arrythmia   . Hypertension Sister   . Asthma Sister     Social History:  reports that she has never smoked. She has never used smokeless tobacco. She reports that she drinks about 1.0 ounces of alcohol per week. She reports that she does not use illicit drugs.  Allergies:  Allergies  Allergen Reactions  . Ativan [Lorazepam]     Causes panic attacks  . Midazolam Other (See Comments)    Caused very crazy feelings, terrors, panic attacks, anxiety  . Zolpidem Tartrate Other (See Comments)    Caused very crazy feelings, terrors, shaking, panic attacks, anxiety  . Neomycin-Bacitracin Zn-Polymyx Itching and Rash    Medications:  Prior to Admission:  Prescriptions prior to admission  Medication Sig Dispense Refill  . Calcium Carbonate Antacid (TUMS E-X PO) Take 2 tablets by mouth daily.      . Calcium Carbonate-Vitamin D (CALTRATE 600+D PO) Take 1 tablet by  mouth daily.       Marland Kitchen estradiol (MINIVELLE) 0.0375 MG/24HR Place 1 patch onto the skin 2 (two) times a week. Sundays and Thursdays      . metFORMIN (GLUCOPHAGE) 1000 MG tablet Take 1,000 mg by mouth 2 (two) times daily with a meal.      . METHYLTESTOSTERONE PO Take 1.25 mg by mouth daily.      . Multiple Vitamin (MULTIVITAMIN) tablet Take 1 tablet by mouth daily. Senior      . ondansetron (ZOFRAN ODT) 8 MG disintegrating tablet Take 1 tablet (8 mg total) by mouth every 8 (eight) hours as needed for nausea.  30 tablet  2  . oxyCODONE (ROXICODONE) 5 MG immediate release tablet Take 1-2 tablets (5-10 mg total) by mouth 4 (four) times daily as needed for pain.  120 tablet  0  . polyethylene glycol (MIRALAX / GLYCOLAX) packet Take 17 g by mouth daily.  100 each  0  . Probiotic Product (SOLUBLE FIBER/PROBIOTICS PO) Take 1 tablet by mouth daily.      . sitaGLIPtin (JANUVIA) 100 MG tablet Take 100 mg by mouth daily.      Marland Kitchen dexamethasone (DECADRON) 4 MG tablet Take 2 tablets (8 mg total) by mouth 2 (two) times daily with a meal. Take daily starting the day after chemotherapy for 2 days. Take with food.  30 tablet  1  . lidocaine-prilocaine (EMLA) cream Apply topically as needed.  30 g  5  . megestrol (MEGACE ES) 625 MG/5ML suspension Take 5 mLs (625 mg total) by mouth daily.  150 mL  7    Results for orders placed during the hospital encounter of 12/16/12 (from the past 48 hour(s))  CBC     Status: Abnormal   Collection Time    12/16/12  1:21 PM      Result Value Range   WBC 10.2  4.0 - 10.5 K/uL   RBC 4.18  3.87 - 5.11 MIL/uL   Hemoglobin 9.9 (*) 12.0 - 15.0 g/dL   HCT 40.9 (*) 81.1 - 91.4 %   MCV 72.0 (*) 78.0 - 100.0 fL   MCH 23.7 (*) 26.0 - 34.0 pg   MCHC 32.9  30.0 - 36.0 g/dL   RDW 78.2  95.6 - 21.3 %   Platelets 396  150 - 400 K/uL  CREATININE, SERUM     Status: Abnormal   Collection Time    12/16/12  1:21 PM      Result Value Range   Creatinine, Ser 0.39 (*) 0.50 - 1.10 mg/dL  GFR  calc non Af Amer >90  >90 mL/min   GFR calc Af Amer >90  >90 mL/min   Comment: (NOTE)     The eGFR has been calculated using the CKD EPI equation.     This calculation has not been validated in all clinical situations.     eGFR's persistently <90 mL/min signify possible Chronic Kidney     Disease.    X-ray Chest Pa And Lateral   12/16/2012   CLINICAL DATA:  Patient with pleural effusion.  EXAM: CHEST  2 VIEW  COMPARISON:  12/13/2012  FINDINGS: There is a left chest wall port a catheter. The tip is in the projection of the SVC. The heart size appears normal. There has been significant interval increase in volume of the right pleural effusion with associated diminished aeration to the right midlung and right upper lobe. The left lung appears clear.  IMPRESSION: 1. Significant interval increase in volume of the right pleural effusion.   Electronically Signed   By: Signa Kell M.D.   On: 12/16/2012 13:41   Ct Chest W Contrast  12/16/2012   CLINICAL DATA:  Stage IV rectal carcinoma. Enlarging lung mass.  EXAM: CT CHEST, ABDOMEN, AND PELVIS WITH CONTRAST  TECHNIQUE: Multidetector CT imaging of the chest, abdomen and pelvis was performed following the standard protocol during bolus administration of intravenous contrast.  CONTRAST:  OMNIPAQUE IOHEXOL 300 MG/ML  SOLN  COMPARISON:  Chest CT 12/13/2012, PET-CT scan 11/02/2012  FINDINGS: CT CHEST FINDINGS  There is a port in the anterior chest wall. No axillary or supraclavicular lymphadenopathy.  There is interval increase in loculated pleural fluid within the right hemi thorax. The lower portion of the hemi thorax is near completely obliterated by the pleural fluid with atelectasis of the right lower lobe and right middle lobe. The upper lobe is also occupied by loculated fluid collection withatelectasis of the right upper lobe additionally. These findings have increased significantly from comparison CT of 12/13/2012. The right lower lobe pulmonary mass  is difficult to define on the background of the large loculated fluid collections.  No mediastinal lymphadenopathy. No pericardial fluid. Small left small effusion which is non loculated.  CT ABDOMEN AND PELVIS FINDINGS  No focal hepatic lesion. The gallbladder, pancreas, spleen, adrenal glands, and kidneys are normal. The stomach, small bowel and cecum are normal. Moderate volume of stool throughout the colon. Abdominal aorta is normal in caliber. No retroperitoneal or periportal lymphadenopathy.  The bladder is normal no pelvic lymphadenopathy. Review of bone windows demonstrates no aggressive osseous lesions. .  IMPRESSION: CT CHEST IMPRESSION  1. Progressively expanding loculated pleural fluid collections in the right hemi thorax with near complete filling of the lower portion of the hemothorax and significant compromise of the upper portion of the hemithorax.  2. Right lower lobe pulmonary mass is difficult to define on the background of expanding loculated fluid collections.  CT ABDOMEN AND PELVIS IMPRESSION  1. Moderate volume of stool throughout the colon suggest constipation or potentially distal obstruction from the rectal mass.  This was made a call report.   Electronically Signed   By: Genevive Bi M.D.   On: 12/16/2012 16:51   Ct Abdomen Pelvis W Contrast  12/16/2012   CLINICAL DATA:  Stage IV rectal carcinoma. Enlarging lung mass.  EXAM: CT CHEST, ABDOMEN, AND PELVIS WITH CONTRAST  TECHNIQUE: Multidetector CT imaging of the chest, abdomen and pelvis was performed following the standard protocol during bolus administration of intravenous contrast.  CONTRAST:  OMNIPAQUE IOHEXOL 300 MG/ML  SOLN  COMPARISON:  Chest CT 12/13/2012, PET-CT scan 11/02/2012  FINDINGS: CT CHEST FINDINGS  There is a port in the anterior chest wall. No axillary or supraclavicular lymphadenopathy.  There is interval increase in loculated pleural fluid within the right hemi thorax. The lower portion of the hemi thorax  is near completely obliterated by the pleural fluid with atelectasis of the right lower lobe and right middle lobe. The upper lobe is also occupied by loculated fluid collection withatelectasis of the right upper lobe additionally. These findings have increased significantly from comparison CT of 12/13/2012. The right lower lobe pulmonary mass is difficult to define on the background of the large loculated fluid collections.  No mediastinal lymphadenopathy. No pericardial fluid. Small left small effusion which is non loculated.  CT ABDOMEN AND PELVIS FINDINGS  No focal hepatic lesion. The gallbladder, pancreas, spleen, adrenal glands, and kidneys are normal. The stomach, small bowel and cecum are normal. Moderate volume of stool throughout the colon. Abdominal aorta is normal in caliber. No retroperitoneal or periportal lymphadenopathy.  The bladder is normal no pelvic lymphadenopathy. Review of bone windows demonstrates no aggressive osseous lesions. .  IMPRESSION: CT CHEST IMPRESSION  1. Progressively expanding loculated pleural fluid collections in the right hemi thorax with near complete filling of the lower portion of the hemothorax and significant compromise of the upper portion of the hemithorax.  2. Right lower lobe pulmonary mass is difficult to define on the background of expanding loculated fluid collections.  CT ABDOMEN AND PELVIS IMPRESSION  1. Moderate volume of stool throughout the colon suggest constipation or potentially distal obstruction from the rectal mass.  This was made a call report.   Electronically Signed   By: Genevive Bi M.D.   On: 12/16/2012 16:51    Review of Systems  Constitutional: Negative for fever and chills.  Respiratory: Positive for cough, sputum production (clear), shortness of breath and wheezing.   Cardiovascular: Positive for chest pain.  Gastrointestinal: Positive for constipation (still having thin stools). Negative for blood in stool.   Blood pressure  127/69, pulse 97, temperature 98.3 F (36.8 C), temperature source Oral, resp. rate 19, height 5' 4.5" (1.638 m), weight 135 lb 12.9 oz (61.6 kg), SpO2 96.00%. Physical Exam  Constitutional: She is oriented to person, place, and time. She appears well-developed and well-nourished. No distress.  HENT:  Head: Normocephalic and atraumatic.  Eyes: Pupils are equal, round, and reactive to light.  Neck: Neck supple. No tracheal deviation present.  Cardiovascular: Normal rate, regular rhythm and normal heart sounds.   Respiratory:  Absent BS right side, left clear  GI: Soft. There is no tenderness.  Musculoskeletal: She exhibits no edema.  Neurological: She is alert and oriented to person, place, and time.  Skin: Skin is warm and dry.    Assessment/Plan: 67 yo woman with stage IV rectal cancer with a right lung met(biopsy proven). She presents with a new rapidly enlarging right pleural effusion. The obvious concern is for a malignant effusion, although the rate of accumulation is pretty rapid for that.  I recommended to her that we proceed with right thoracentesis at bedside to drain some of the effusion. It will provide some symptomatic relief and allow [place] to run tests to determine the etiology of the fluid. The effusion is too large to drain all of the fluid at one setting.  I discussed the risks and benefits of the procedure with [name]. She understands  the risks of bleeding and pneumothorax.  Dimitri Dsouza C 12/16/2012, 5:57 PM    Addendum  1.5 of straw colored slightly murky fluid was withdrawn. She tolerated it well. Post procedure CXR showed improved aeration of the right lung although there is still a significant right effusion. Fluid sent for cytology, cell count with diff, culture, protein, glucose, LDH, and triglycerides(? Chylothorax)

## 2012-12-16 NOTE — H&P (Signed)
Anne [name] [id number] 10-24-1945 67 y.o. 12/16/2012 5:15 PM  CC: Dr. 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 Of note,colonoscopy prior to this admission had been performed on 03/04/10(screening) which showed three polyps ,largest 8 mm, removed, as well as moderate diverticulosis in the sigmoid colon, otherwise normal exam . On presentation, she had Positive Guaiac and a rectal mass had been palpated on exam. On 7/29 she underwent colonoscopy (Dr. Marina Goodell) which revealed a bulky, circumferential, friable mass lesion in the rectum of about 4 cm above the dentate line, subtotally obstructing and would not permit passage of the colonoscope proximally. Multiple biopsies were taken. Pathology report pending.  CT of the chest, abdomen and pelvis on 10/25/2012 demonstrated a lobulated irregular mass in the superior segment right lower lobe measuring 5.0 x 4.6 cm image 41, a rim calcified right upper lobe 0.8 cm nodule, centrally lucent , a 2 and 3 mm left intrapulmonary lymph node abutting the major fissure ,no lymphadenopathy, pericardial or pleural effusion. No focal lytic or sclerotic osseous lesion is identified. In the abdomen,there is mass- like rectosigmoid colonic wall thickening measuring 2.9 cm with adjacent pericolonic stranding and abnormal lymphadenopathy, with dominant right pericolonic lymph node measuring 0.8 cm The mass- like thickening immediately abuts the bladder but no obvious intravesical invasion is identified allowing for technique. 4 mm lymph node at the  iliac artery bifurcation is nonspecific but new since the prior exam in 2012 as well and suspicious. Small bowel,appendix,liver, kidneys, adrenal glands, spleen, gallbladder, and pancreas are normal. No focal identifiable osseous lesion.  She had bronchoscopy on 10/27/2012 for evaluation of the RLL mass. A biopsy was performed of the lung mass and 9 is consistent with an adenocarcinoma consistent with metastasis    patient was begun on chemotherapy starting on 11/22/2012. She began FOLFOX every 2 weeks. Her last treatment was given on 12/06/2012. She was again seen in followup on 9/16 at which time she was complaining on increasing shortness of breath, with tachycardia and occasional chest pains. Because of this she had a CT angiogram performed that revealed no evidence of pulmonary embolism but she was found to have increasing pleural effusion and increasing in the size of the mass. Because of this she is seen patient had increasing shortness of breath weakness fatigue weight loss poor appetite. She is now being admitted for further workup and management of her symptoms.   PMH: Past Medical History  Diagnosis Date  . Heart murmur, systolic 07/01/2011  . Postmenopausal HRT (hormone replacement therapy) 07/01/2011    surgical menopause  . Hx of hyperglycemia 07/01/2011  . Hx of compression fracture of spine 1985    t12 after a  20 foot fall  . Hx of fracture of wrist     after fall   . 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 Hx of colonic polyps   . Early cataract     stoneburner   . High frequency hearing loss     [facility] followed   . Diabetes mellitus without complication   . Cancer     colon  .  Skin cancer   . Complication of anesthesia     pt hadpanic attacks with versed, no versed    Past Surgical History  Procedure Laterality Date  . Tonsillectomy  1952  . Adenoidectomy  1952  . Wrist surgery  1957    left reduction  . Compression fraction  1985    T12  . Anterior cruciate  ligament repair  1993    right Murphy  . Cosmetic surgery  1999-2007    holderness blepharoplasty facial rhytidectomy mastopexy abdominoplasty   . Dental implant    . Dilation and curettage of uterus  1977  . Total abdominal hysterectomy  2005    with cystocele repair   . Colonoscopy N/A 10/24/2012    Procedure: COLONOSCOPY;  Surgeon: Hilarie Fredrickson, MD;  Location: [facility] ENDOSCOPY;  Service: Endoscopy;  Laterality: N/A;  . Colonoscopy N/A 10/25/2012    Procedure: COLONOSCOPY;  Surgeon: Hilarie Fredrickson, MD;  Location: [facility] ENDOSCOPY;  Service: Endoscopy;  Laterality: N/A;  . Video bronchoscopy Bilateral 10/27/2012    Procedure: VIDEO BRONCHOSCOPY WITH FLUORO;  Surgeon: Coralyn Helling, MD;  Location: [facility] ENDOSCOPY;  Service: Endoscopy;  Laterality: Bilateral;  . Portacath placement Left 11/21/2012    Procedure: INSERTION PORT-A-CATH;  Surgeon: Loreli Slot, MD;  Location: [facility] OR;  Service: Thoracic;  Laterality: Left;    Allergies: Allergies  Allergen Reactions  . Ativan [Lorazepam]     Causes panic attacks  . Midazolam Other (See Comments)    Caused very crazy feelings, terrors, panic attacks, anxiety  . Zolpidem Tartrate Other (See Comments)    Caused very crazy feelings, terrors, shaking, panic attacks, anxiety  . Neomycin-Bacitracin Zn-Polymyx Itching and Rash    Medications: Medications Prior to Admission  Medication Sig Dispense Refill  . Calcium Carbonate Antacid (TUMS E-X PO) Take 2 tablets by mouth daily.      . Calcium Carbonate-Vitamin D (CALTRATE 600+D PO) Take 1 tablet by mouth daily.       Marland Kitchen estradiol (MINIVELLE) 0.0375 MG/24HR Place 1 patch onto the skin 2 (two) times a week. Sundays and Thursdays      . metFORMIN (GLUCOPHAGE) 1000 MG tablet Take 1,000 mg by mouth 2 (two) times daily with a meal.      . METHYLTESTOSTERONE PO Take 1.25 mg by mouth daily.      . Multiple Vitamin (MULTIVITAMIN) tablet Take 1 tablet by mouth daily. Senior      . ondansetron (ZOFRAN ODT) 8 MG  disintegrating tablet Take 1 tablet (8 mg total) by mouth every 8 (eight) hours as needed for nausea.  30 tablet  2  . oxyCODONE (ROXICODONE) 5 MG immediate release tablet Take 1-2 tablets (5-10 mg total) by mouth 4 (four) times daily as needed for pain.  120 tablet  0  . polyethylene glycol (MIRALAX / GLYCOLAX) packet Take 17 g by mouth daily.  100 each  0  . Probiotic Product (SOLUBLE FIBER/PROBIOTICS PO) Take 1 tablet by mouth daily.      . sitaGLIPtin (JANUVIA) 100 MG tablet Take 100 mg by mouth daily.      Marland Kitchen dexamethasone (DECADRON) 4 MG tablet Take 2 tablets (8 mg total) by mouth 2 (two) times daily with a meal. Take daily starting the day after chemotherapy for 2 days. Take with food.  30 tablet  1  . lidocaine-prilocaine (EMLA) cream Apply topically as needed.  30 g  5  . megestrol (MEGACE ES) 625 MG/5ML suspension Take 5 mLs (625 mg total)  by mouth daily.  150 mL  7    Social History:   reports that she has never smoked. She has never used smokeless tobacco. She reports that she drinks about 1.0 ounces of alcohol per week. She reports that she does not use illicit drugs.  Family History: Family History  Problem Relation Age of Onset  . Diabetes Father   . Cancer Father     oral  . Thyroid disease Father   . Heart attack Father     died age 18  . Dementia Mother   . Hypertension Mother   . Hyperlipidemia Mother   . Cancer Brother     retro lymphosarcoma agent orange  . Cancer Sister     clear cell ovarina and breast insitu  . Obesity      siblings   . Diabetes Brother   . Alcohol abuse Brother     hx of  . Other      arrythmia   . Hypertension Sister   . Asthma Sister     Review of Systems: Constitutional ROS: Fever no, Chills, Night Sweats, Anorexia, Pain chest Cardiovascular ROS: positive for - dyspnea on exertion Respiratory ROS: positive for - cough, orthopnea, shortness of breath and wheezing Neurological ROS: negative Dermatological ROS: negative ENT ROS:  negative Gastrointestinal ROS: negative Genito-Urinary ROS: no dysuria, trouble voiding, or hematuria Hematological and Lymphatic ROS: negative Breast ROS: negative Musculoskeletal ROS: negative Remaining ROS negative.  Physical Exam: Blood pressure 127/69, pulse 97, temperature 98.3 F (36.8 C), temperature source Oral, resp. rate 19, height 5' 4.5" (1.638 m), weight 135 lb 12.9 oz (61.6 kg), SpO2 96.00%.  General appearance: alert, cooperative, appears older than stated age, cachectic, fatigued, flushed, mild distress and pale Resp: diminished breath sounds RLL and RML Cardio: regularly irregular rhythm GI: soft, non-tender; bowel sounds normal; no masses,  no organomegaly Extremities: extremities normal, atraumatic, no cyanosis or edema   Lab Results:  Basic Metabolic Panel:  Recent Labs Lab 12/13/12 0811 12/16/12 0809 12/16/12 1321  NA 132* 121*  --   K 3.3* 4.2  --   CO2 30* 26  --   GLUCOSE 189* 294*  --   BUN 7.2 11.6  --   CREATININE 0.6 0.6 0.39*  CALCIUM 10.2 9.5  --    GFR Estimated Creatinine Clearance: 59.4 ml/min (by C-G formula based on Cr of 0.39). Liver Function Tests:  Recent Labs Lab 12/13/12 0811 12/16/12 0809  AST 14 16  ALT 18 15  ALKPHOS 66 74  BILITOT 0.37 0.39  PROT 6.8 6.2*  ALBUMIN 2.8* 2.2*   No results found for this basename: LIPASE, AMYLASE,  in the last 168 hours No results found for this basename: AMMONIA,  in the last 168 hours Coagulation profile  Recent Labs Lab 12/16/12 0809  INR 1.20*  PROTIME 14.4*    CBC:  Recent Labs Lab 12/13/12 0811 12/16/12 0809 12/16/12 1321  WBC 11.2* 10.9* 10.2  NEUTROABS 9.6* 9.0*  --   HGB 9.3* 9.8* 9.9*  HCT 28.3* 29.3* 30.1*  MCV 73.9* 73.1* 72.0*  PLT 410* 391 396   Cardiac Enzymes: No results found for this basename: CKTOTAL, CKMB, CKMBINDEX, TROPONINI,  in the last 168 hours BNP: No components found with this basename: POCBNP,  CBG: No results found for this  basename: GLUCAP,  in the last 168 hours D-Dimer No results found for this basename: DDIMER,  in the last 72 hours Hgb A1c No results found for this basename:  HGBA1C,  in the last 72 hours Lipid Profile No results found for this basename: CHOL, HDL, LDLCALC, TRIG, CHOLHDL, LDLDIRECT,  in the last 72 hours Thyroid function studies No results found for this basename: TSH, T4TOTAL, FREET3, T3FREE, THYROIDAB,  in the last 72 hours Anemia work up No results found for this basename: VITAMINB12, FOLATE, FERRITIN, TIBC, IRON, RETICCTPCT,  in the last 72 hours Microbiology No results found for this or any previous visit (from the past 240 hour(s)).   Radiological Studies: X-ray Chest Pa And Lateral   12/16/2012   CLINICAL DATA:  Patient with pleural effusion.  EXAM: CHEST  2 VIEW  COMPARISON:  12/13/2012  FINDINGS: There is a left chest wall port a catheter. The tip is in the projection of the SVC. The heart size appears normal. There has been significant interval increase in volume of the right pleural effusion with associated diminished aeration to the right midlung and right upper lobe. The left lung appears clear.  IMPRESSION: 1. Significant interval increase in volume of the right pleural effusion.   Electronically Signed   By: Signa Kell M.D.   On: 12/16/2012 13:41   Ct Chest W Contrast  12/16/2012   CLINICAL DATA:  Stage IV rectal carcinoma. Enlarging lung mass.  EXAM: CT CHEST, ABDOMEN, AND PELVIS WITH CONTRAST  TECHNIQUE: Multidetector CT imaging of the chest, abdomen and pelvis was performed following the standard protocol during bolus administration of intravenous contrast.  CONTRAST:  OMNIPAQUE IOHEXOL 300 MG/ML  SOLN  COMPARISON:  Chest CT 12/13/2012, PET-CT scan 11/02/2012  FINDINGS: CT CHEST FINDINGS  There is a port in the anterior chest wall. No axillary or supraclavicular lymphadenopathy.  There is interval increase in loculated pleural fluid within the right hemi thorax. The  lower portion of the hemi thorax is near completely obliterated by the pleural fluid with atelectasis of the right lower lobe and right middle lobe. The upper lobe is also occupied by loculated fluid collection withatelectasis of the right upper lobe additionally. These findings have increased significantly from comparison CT of 12/13/2012. The right lower lobe pulmonary mass is difficult to define on the background of the large loculated fluid collections.  No mediastinal lymphadenopathy. No pericardial fluid. Small left small effusion which is non loculated.  CT ABDOMEN AND PELVIS FINDINGS  No focal hepatic lesion. The gallbladder, pancreas, spleen, adrenal glands, and kidneys are normal. The stomach, small bowel and cecum are normal. Moderate volume of stool throughout the colon. Abdominal aorta is normal in caliber. No retroperitoneal or periportal lymphadenopathy.  The bladder is normal no pelvic lymphadenopathy. Review of bone windows demonstrates no aggressive osseous lesions. .  IMPRESSION: CT CHEST IMPRESSION  1. Progressively expanding loculated pleural fluid collections in the right hemi thorax with near complete filling of the lower portion of the hemothorax and significant compromise of the upper portion of the hemithorax.  2. Right lower lobe pulmonary mass is difficult to define on the background of expanding loculated fluid collections.  CT ABDOMEN AND PELVIS IMPRESSION  1. Moderate volume of stool throughout the colon suggest constipation or potentially distal obstruction from the rectal mass.  This was made a call report.   Electronically Signed   By: Genevive Bi M.D.   On: 12/16/2012 16:51   Ct Abdomen Pelvis W Contrast  12/16/2012   CLINICAL DATA:  Stage IV rectal carcinoma. Enlarging lung mass.  EXAM: CT CHEST, ABDOMEN, AND PELVIS WITH CONTRAST  TECHNIQUE: Multidetector CT imaging of the chest,  abdomen and pelvis was performed following the standard protocol during bolus administration  of intravenous contrast.  CONTRAST:  OMNIPAQUE IOHEXOL 300 MG/ML  SOLN  COMPARISON:  Chest CT 12/13/2012, PET-CT scan 11/02/2012  FINDINGS: CT CHEST FINDINGS  There is a port in the anterior chest wall. No axillary or supraclavicular lymphadenopathy.  There is interval increase in loculated pleural fluid within the right hemi thorax. The lower portion of the hemi thorax is near completely obliterated by the pleural fluid with atelectasis of the right lower lobe and right middle lobe. The upper lobe is also occupied by loculated fluid collection withatelectasis of the right upper lobe additionally. These findings have increased significantly from comparison CT of 12/13/2012. The right lower lobe pulmonary mass is difficult to define on the background of the large loculated fluid collections.  No mediastinal lymphadenopathy. No pericardial fluid. Small left small effusion which is non loculated.  CT ABDOMEN AND PELVIS FINDINGS  No focal hepatic lesion. The gallbladder, pancreas, spleen, adrenal glands, and kidneys are normal. The stomach, small bowel and cecum are normal. Moderate volume of stool throughout the colon. Abdominal aorta is normal in caliber. No retroperitoneal or periportal lymphadenopathy.  The bladder is normal no pelvic lymphadenopathy. Review of bone windows demonstrates no aggressive osseous lesions. .  IMPRESSION: CT CHEST IMPRESSION  1. Progressively expanding loculated pleural fluid collections in the right hemi thorax with near complete filling of the lower portion of the hemothorax and significant compromise of the upper portion of the hemithorax.  2. Right lower lobe pulmonary mass is difficult to define on the background of expanding loculated fluid collections.  CT ABDOMEN AND PELVIS IMPRESSION  1. Moderate volume of stool throughout the colon suggest constipation or potentially distal obstruction from the rectal mass.  This was made a call report.   Electronically Signed   By: Genevive Bi M.D.   On: 12/16/2012 16:51     Impression and Plan: 67 year old female with  #1 stage IV metastatic rectal carcinoma: Patient has lung metastasis. She has received 2 cycles of chemotherapy consisting of FOLFOX.  #2 shortness of breath: Secondary to pleural effusion. I have asked rate intervention or radiation oncology to see the patient for a thoracentesis. We will send the fluid for cytology.  #3 lung metastasis: Patient has been seen in the past by Dr. Edwina Barth. I have requested him to consult on the patient for discussion of further management of this enlarging mass. I will also ask radiation oncology to see the patient.  #4 failure to thrive: We will ask nutrition to see the patient.  #5 deconditioning: We will get physical therapy involved.  #6 patient will then team you to be on IV fluids at 75 cc an hour. She does have hyponatremia and we will only use normal saline.  #7 patient is a full code.  #8 due to patient's acute illness she needs to be in the hospital.     Drue Second, MD 12/16/2012

## 2012-12-16 NOTE — Progress Notes (Signed)
Patient was brought to [place] for a thoracentesis. Upon reviewing images d/w Dr. Bonnielee Haff, it is felt that due to necrotic lung tissue it would not be safe to perform thoracentesis with the window available at this time because of the risk of bronchopleural fistula formation.   Pattricia Boss PA-C Interventional Radiology  12/16/12  3:00 PM

## 2012-12-16 NOTE — Procedures (Signed)
Using sterile technique and 8 ml of 1% lidocaine local anesthetic right thoracentesis performed at bedside.  1.5 L of slightly murky, straw colored fluid evacuated  She did have some right shoulder discomfort and coughing during the procedure  Fluid to be sent for cytology + usual studies including triglycerides

## 2012-12-16 NOTE — Progress Notes (Signed)
Echocardiogram 2D Echocardiogram has been performed.  Anne [name] 12/16/2012, 1:47 PM

## 2012-12-16 NOTE — Progress Notes (Signed)
Put fmla form on nurse's desk °

## 2012-12-17 ENCOUNTER — Inpatient Hospital Stay (HOSPITAL_COMMUNITY): Payer: 59

## 2012-12-17 DIAGNOSIS — J90 Pleural effusion, not elsewhere classified: Secondary | ICD-10-CM

## 2012-12-17 LAB — CBC
Hemoglobin: 8.6 g/dL — ABNORMAL LOW (ref 12.0–15.0)
Platelets: 344 10*3/uL (ref 150–400)
RBC: 3.65 MIL/uL — ABNORMAL LOW (ref 3.87–5.11)
WBC: 5 10*3/uL (ref 4.0–10.5)

## 2012-12-17 LAB — GLUCOSE, CAPILLARY: Glucose-Capillary: 214 mg/dL — ABNORMAL HIGH (ref 70–99)

## 2012-12-17 LAB — COMPREHENSIVE METABOLIC PANEL
ALT: 13 U/L (ref 0–35)
AST: 14 U/L (ref 0–37)
Alkaline Phosphatase: 59 U/L (ref 39–117)
CO2: 26 mEq/L (ref 19–32)
Calcium: 8.8 mg/dL (ref 8.4–10.5)
Chloride: 85 mEq/L — ABNORMAL LOW (ref 96–112)
GFR calc non Af Amer: >90 mL/min (ref >90)
Potassium: 3.9 mEq/L (ref 3.5–5.1)
Sodium: 120 mEq/L — ABNORMAL LOW (ref 135–145)
Total Bilirubin: 0.3 mg/dL (ref 0.3–1.2)

## 2012-12-17 LAB — PH, BODY FLUID: pH, Fluid: 7

## 2012-12-17 LAB — ABO/RH: ABO/RH(D): O POS

## 2012-12-17 MED ORDER — ONDANSETRON 8 MG PO TBDP
8.0000 mg | ORAL_TABLET | Freq: Three times a day (TID) | ORAL | Status: DC | PRN
  Administered 2012-12-17: 8 mg via ORAL
  Filled 2012-12-17 (×2): qty 1

## 2012-12-17 MED ORDER — OXYCODONE HCL 5 MG PO TABS
10.0000 mg | ORAL_TABLET | ORAL | Status: DC | PRN
  Administered 2012-12-17 – 2012-12-21 (×13): 10 mg via ORAL
  Filled 2012-12-17 (×13): qty 2

## 2012-12-17 MED ORDER — POLYETHYLENE GLYCOL 3350 17 G PO PACK
17.0000 g | PACK | Freq: Every day | ORAL | Status: DC
  Administered 2012-12-17 – 2012-12-20 (×3): 17 g via ORAL
  Filled 2012-12-17 (×5): qty 1

## 2012-12-17 MED ORDER — ACETAMINOPHEN 325 MG PO TABS
650.0000 mg | ORAL_TABLET | Freq: Once | ORAL | Status: AC
  Administered 2012-12-17: 650 mg via ORAL
  Filled 2012-12-17: qty 2

## 2012-12-17 MED ORDER — DIPHENHYDRAMINE HCL 25 MG PO CAPS
25.0000 mg | ORAL_CAPSULE | Freq: Once | ORAL | Status: AC
  Administered 2012-12-17: 25 mg via ORAL
  Filled 2012-12-17: qty 1

## 2012-12-17 MED ORDER — METFORMIN HCL 500 MG PO TABS
1000.0000 mg | ORAL_TABLET | Freq: Two times a day (BID) | ORAL | Status: DC
  Administered 2012-12-17 – 2012-12-20 (×8): 1000 mg via ORAL
  Filled 2012-12-17 (×11): qty 2

## 2012-12-17 MED ORDER — METHYLTESTOSTERONE 10 MG PO TABS
ORAL_TABLET | Freq: Every day | ORAL | Status: DC
  Administered 2012-12-17: 1.25 mg via ORAL
  Administered 2012-12-18 – 2012-12-20 (×3): via ORAL

## 2012-12-17 MED ORDER — ESTRADIOL 0.0375 MG/24HR TD PTTW: 1.0000 | MEDICATED_PATCH | TRANSDERMAL | Status: DC

## 2012-12-17 MED ORDER — LINAGLIPTIN 5 MG PO TABS
5.0000 mg | ORAL_TABLET | Freq: Every day | ORAL | Status: DC
  Administered 2012-12-17 – 2012-12-20 (×4): 5 mg via ORAL
  Filled 2012-12-17 (×5): qty 1

## 2012-12-17 MED ORDER — ESTRADIOL 0.1 MG/24HR TD PTWK
0.1000 mg | MEDICATED_PATCH | TRANSDERMAL | Status: DC
  Administered 2012-12-18: 0.1 mg via TRANSDERMAL
  Filled 2012-12-17: qty 1

## 2012-12-17 MED ORDER — METHYLTESTOSTERONE 10 MG PO TABS: ORAL_TABLET | Freq: Once | ORAL | Status: DC

## 2012-12-17 NOTE — Progress Notes (Signed)
Anne [name]   DOB:May 17, 1945   ZO#:[id number]   [id number]  Subjective:patient had a very restful night. After having thoracentesis performed she was able to breathe better. She slept her a sample he did not require pain medications. She remains afebrile other vitals is stable.   Objective:  Filed Vitals:   12/17/12 1630  BP: 142/66  Pulse: 88  Temp: 98.3 F (36.8 C)  Resp: 18    Body mass index is 24.26 kg/(m^2).  Intake/Output Summary (Last 24 hours) at 12/17/12 1657 Last data filed at 12/17/12 1520  Gross per 24 hour  Intake 1111.25 ml  Output    950 ml  Net 161.25 ml     Sclerae unicteric  Oropharynx clear  No peripheral adenopathy  Lungs right breath sounds diminished  Heart regular rate and rhythm  Abdomen benign  MSK no focal spinal tenderness, no peripheral edema  Neuro nonfocal  Breast exam: deferred  CBG (last 3)   Recent Labs  12/17/12 0821 12/17/12 1235  GLUCAP 177* 251*     Labs:  Lab Results  Component Value Date   WBC 5.0 12/17/2012   HGB 8.6* 12/17/2012   HCT 26.2* 12/17/2012   MCV 71.8* 12/17/2012   PLT 344 12/17/2012   NEUTROABS 9.0* 12/16/2012    Urine Studies No results found for this basename: UACOL, UAPR, USPG, UPH, UTP, UGL, UKET, UBIL, UHGB, UNIT, UROB, ULEU, UEPI, UWBC, URBC, UBAC, CAST, CRYS, UCOM, BILUA,  in the last 72 hours  Basic Metabolic Panel:  Recent Labs Lab 12/13/12 0811 12/16/12 0809 12/16/12 1321 12/17/12 0600  NA 132* 121*  --  120*  K 3.3* 4.2  --  3.9  CL  --   --   --  85*  CO2 30* 26  --  26  GLUCOSE 189* 294*  --  186*  BUN 7.2 11.6  --  9  CREATININE 0.6 0.6 0.39* 0.37*  CALCIUM 10.2 9.5  --  8.8   GFR Estimated Creatinine Clearance: 59.4 ml/min (by C-G formula based on Cr of 0.37). Liver Function Tests:  Recent Labs Lab 12/13/12 0811 12/16/12 0809 12/17/12 0600  AST 14 16 14   ALT 18 15 13   ALKPHOS 66 74 59  BILITOT 0.37 0.39 0.3  PROT 6.8 6.2* 5.4*  ALBUMIN 2.8* 2.2* 2.0*   No  results found for this basename: LIPASE, AMYLASE,  in the last 168 hours No results found for this basename: AMMONIA,  in the last 168 hours Coagulation profile  Recent Labs Lab 12/16/12 0809  INR 1.20*  PROTIME 14.4*    CBC:  Recent Labs Lab 12/13/12 0811 12/16/12 0809 12/16/12 1321 12/17/12 0600  WBC 11.2* 10.9* 10.2 5.0  NEUTROABS 9.6* 9.0*  --   --   HGB 9.3* 9.8* 9.9* 8.6*  HCT 28.3* 29.3* 30.1* 26.2*  MCV 73.9* 73.1* 72.0* 71.8*  PLT 410* 391 396 344   Cardiac Enzymes: No results found for this basename: CKTOTAL, CKMB, CKMBINDEX, TROPONINI,  in the last 168 hours BNP: No components found with this basename: POCBNP,  CBG:  Recent Labs Lab 12/17/12 0821 12/17/12 1235  GLUCAP 177* 251*   D-Dimer No results found for this basename: DDIMER,  in the last 72 hours Hgb A1c No results found for this basename: HGBA1C,  in the last 72 hours Lipid Profile No results found for this basename: CHOL, HDL, LDLCALC, TRIG, CHOLHDL, LDLDIRECT,  in the last 72 hours Thyroid function studies No results found for this basename: TSH,  T4TOTAL, FREET3, T3FREE, THYROIDAB,  in the last 72 hours Anemia work up No results found for this basename: VITAMINB12, FOLATE, FERRITIN, TIBC, IRON, RETICCTPCT,  in the last 72 hours Microbiology Recent Results (from the past 240 hour(s))  BODY FLUID CULTURE     Status: None   Collection Time    12/16/12  5:51 PM      Result Value Range Status   Specimen Description PLEURAL   Final   Special Requests Immunocompromised   Final   Gram Stain     Final   Value: MODERATE WBC PRESENT, PREDOMINANTLY PMN     NO ORGANISMS SEEN     Performed at [facility]   Culture     Final   Value: NO GROWTH     Performed at [facility]   Report Status PENDING   Incomplete      Studies:  Dg Chest 2 View  12/17/2012   CLINICAL DATA:  Cough. Shortness of breath. Right pleural effusion, with 1.5 L of fluid removed yesterday.  EXAM: CHEST  2  VIEW  COMPARISON:  12/16/2012  FINDINGS: Large right pleural effusion noted with only a small amount of aerated lung. The degree of right hemithoracic opacification appear is similar to the postprocedural radiograph.  Left-sided Port-A-Cath tip: SVC. Left lung appears clear. No cardiomegaly.  IMPRESSION: 1. Stable appearance of the chest, with large right pleural effusion and a relatively small amount of aerated lung on the right.   Electronically Signed   By: Herbie Baltimore   On: 12/17/2012 10:12   X-ray Chest Pa And Lateral   12/16/2012   CLINICAL DATA:  Patient with pleural effusion.  EXAM: CHEST  2 VIEW  COMPARISON:  12/13/2012  FINDINGS: There is a left chest wall port a catheter. The tip is in the projection of the SVC. The heart size appears normal. There has been significant interval increase in volume of the right pleural effusion with associated diminished aeration to the right midlung and right upper lobe. The left lung appears clear.  IMPRESSION: 1. Significant interval increase in volume of the right pleural effusion.   Electronically Signed   By: Signa Kell M.D.   On: 12/16/2012 13:41   Ct Chest W Contrast  12/16/2012   CLINICAL DATA:  Stage IV rectal carcinoma. Enlarging lung mass.  EXAM: CT CHEST, ABDOMEN, AND PELVIS WITH CONTRAST  TECHNIQUE: Multidetector CT imaging of the chest, abdomen and pelvis was performed following the standard protocol during bolus administration of intravenous contrast.  CONTRAST:  OMNIPAQUE IOHEXOL 300 MG/ML  SOLN  COMPARISON:  Chest CT 12/13/2012, PET-CT scan 11/02/2012  FINDINGS: CT CHEST FINDINGS  There is a port in the anterior chest wall. No axillary or supraclavicular lymphadenopathy.  There is interval increase in loculated pleural fluid within the right hemi thorax. The lower portion of the hemi thorax is near completely obliterated by the pleural fluid with atelectasis of the right lower lobe and right middle lobe. The upper lobe is also occupied  by loculated fluid collection withatelectasis of the right upper lobe additionally. These findings have increased significantly from comparison CT of 12/13/2012. The right lower lobe pulmonary mass is difficult to define on the background of the large loculated fluid collections.  No mediastinal lymphadenopathy. No pericardial fluid. Small left small effusion which is non loculated.  CT ABDOMEN AND PELVIS FINDINGS  No focal hepatic lesion. The gallbladder, pancreas, spleen, adrenal glands, and kidneys are normal. The stomach, small bowel  and cecum are normal. Moderate volume of stool throughout the colon. Abdominal aorta is normal in caliber. No retroperitoneal or periportal lymphadenopathy.  The bladder is normal no pelvic lymphadenopathy. Review of bone windows demonstrates no aggressive osseous lesions. .  IMPRESSION: CT CHEST IMPRESSION  1. Progressively expanding loculated pleural fluid collections in the right hemi thorax with near complete filling of the lower portion of the hemothorax and significant compromise of the upper portion of the hemithorax.  2. Right lower lobe pulmonary mass is difficult to define on the background of expanding loculated fluid collections.  CT ABDOMEN AND PELVIS IMPRESSION  1. Moderate volume of stool throughout the colon suggest constipation or potentially distal obstruction from the rectal mass.  This was made a call report.   Electronically Signed   By: Genevive Bi M.D.   On: 12/16/2012 16:51   [place] Chest  12/16/2012   CLINICAL DATA:  Pleural effusion, lung metastases, history of rectal carcinoma.  EXAM: CHEST ULTRASOUND  COMPARISON:  CT 12/13/2012 and earlier studies  FINDINGS: Survey ultrasound of the chest was performed in contemplation of thoracentesis. Cavitary/ necrotic lung lesion and a small effusion identified. Thoracentesis was deferred due to concern of possible bronchopleural fistula. Images were reviewed with Dr. Bonnielee Haff by the PA at the time of the procedure.   IMPRESSION: Small effusion and adjacent necrotic/ cystic right lung mass. Thoracentesis deferred.   Electronically Signed   By: Oley Balm M.D.   On: 12/16/2012 21:32   Ct Abdomen Pelvis W Contrast  12/16/2012   CLINICAL DATA:  Stage IV rectal carcinoma. Enlarging lung mass.  EXAM: CT CHEST, ABDOMEN, AND PELVIS WITH CONTRAST  TECHNIQUE: Multidetector CT imaging of the chest, abdomen and pelvis was performed following the standard protocol during bolus administration of intravenous contrast.  CONTRAST:  OMNIPAQUE IOHEXOL 300 MG/ML  SOLN  COMPARISON:  Chest CT 12/13/2012, PET-CT scan 11/02/2012  FINDINGS: CT CHEST FINDINGS  There is a port in the anterior chest wall. No axillary or supraclavicular lymphadenopathy.  There is interval increase in loculated pleural fluid within the right hemi thorax. The lower portion of the hemi thorax is near completely obliterated by the pleural fluid with atelectasis of the right lower lobe and right middle lobe. The upper lobe is also occupied by loculated fluid collection withatelectasis of the right upper lobe additionally. These findings have increased significantly from comparison CT of 12/13/2012. The right lower lobe pulmonary mass is difficult to define on the background of the large loculated fluid collections.  No mediastinal lymphadenopathy. No pericardial fluid. Small left small effusion which is non loculated.  CT ABDOMEN AND PELVIS FINDINGS  No focal hepatic lesion. The gallbladder, pancreas, spleen, adrenal glands, and kidneys are normal. The stomach, small bowel and cecum are normal. Moderate volume of stool throughout the colon. Abdominal aorta is normal in caliber. No retroperitoneal or periportal lymphadenopathy.  The bladder is normal no pelvic lymphadenopathy. Review of bone windows demonstrates no aggressive osseous lesions. .  IMPRESSION: CT CHEST IMPRESSION  1. Progressively expanding loculated pleural fluid collections in the right hemi thorax  with near complete filling of the lower portion of the hemothorax and significant compromise of the upper portion of the hemithorax.  2. Right lower lobe pulmonary mass is difficult to define on the background of expanding loculated fluid collections.  CT ABDOMEN AND PELVIS IMPRESSION  1. Moderate volume of stool throughout the colon suggest constipation or potentially distal obstruction from the rectal mass.  This was made  a call report.   Electronically Signed   By: Genevive Bi M.D.   On: 12/16/2012 16:51   Dg Chest Port 1 View  12/16/2012   CLINICAL DATA:  Status post right thoracentesis  EXAM: PORTABLE CHEST - 1 VIEW  COMPARISON:  12/16/12  FINDINGS: There is a left chest wall port a catheter with tip in the projection the SVC. A large right pleural effusion is again noted. There is significantly diminished aeration to the right lung. No pneumothorax is identified after right thoracentesis. The left lung is clear.  IMPRESSION: 1. No pneumothorax identified after right thoracentesis.  2. Persistent large right pleural effusion with diminished aeration to the right lower lobe   Electronically Signed   By: Signa Kell M.D.   On: 12/16/2012 18:19    Assessment: 66 y.o.admitted yesterday with increasing shortness of breath in the setting of metastatic rectal carcinoma. Patient was found to have right-sided pleural effusions. She's had a thoracentesis performed with significant relief in her symptomatology. Her pain is significantly improved with a thoracentesis.    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  #2 right-sided pleural effusion: Status post thoracentesis. She is followed by Dr. Edwina Barth. She had thoracentesis performed by him. Repeat chest x-ray this morning is pending.  #3 anemia: Patient may be symptomatic from this we will go ahead and give her 2 units of packed red cells today.  #4  diabetes: We will start her medications.  #5 pain: Patient is on hydrocodone this is helping her.  #6 surgical menopause: Patient is on hormone replacement therapy  #7 patient is on DVT prophylaxis  #8 patient is a full code   Drue Second 12/17/2012

## 2012-12-18 LAB — BASIC METABOLIC PANEL
BUN: 8 mg/dL (ref 6–23)
CO2: 25 mEq/L (ref 19–32)
Calcium: 8.7 mg/dL (ref 8.4–10.5)
Creatinine, Ser: 0.34 mg/dL — ABNORMAL LOW (ref 0.50–1.10)
GFR calc Af Amer: >90 mL/min (ref >90)
Glucose, Bld: 214 mg/dL — ABNORMAL HIGH (ref 70–99)
Potassium: 3.5 mEq/L (ref 3.5–5.1)

## 2012-12-18 LAB — GLUCOSE, CAPILLARY
Glucose-Capillary: 321 mg/dL — ABNORMAL HIGH (ref 70–99)
Glucose-Capillary: 251 mg/dL — ABNORMAL HIGH (ref 70–99)

## 2012-12-18 LAB — CBC
HCT: 32.3 % — ABNORMAL LOW (ref 36.0–46.0)
Hemoglobin: 11 g/dL — ABNORMAL LOW (ref 12.0–15.0)
MCH: 24.2 pg — ABNORMAL LOW (ref 26.0–34.0)
MCV: 71 fL — ABNORMAL LOW (ref 78.0–100.0)
Platelets: 314 10*3/uL (ref 150–400)
RBC: 4.55 MIL/uL (ref 3.87–5.11)
WBC: 5.1 10*3/uL (ref 4.0–10.5)

## 2012-12-18 NOTE — Progress Notes (Signed)
DIAGNOSIS: Colon cancer, with large pleural effusion status post thoracentesis, and anemia status post transfusion.  INTERVAL HISTORY: Anne [name] 67 y.o. female is seen today in the hospital. She underwent thoracentesis 2 days ago with marked improvement in her shortness of breath. She also received 2 units of blood transfusion yesterday which she tolerated well without transfusion reactions. Overall, his symptoms of shortness of breath have markedly improved. Her only complaint today is persistent right shoulder pain, resolved with oxycodone as needed. She rated her shoulder pain and abdomen and pain at about 4/10 pain. Her abdomen the pain comes and goes and it does not bother her too much. She also has chronic constipation, which she is to have regular bowel movements with passage of soft stools with no evidence of blood in the stools. She also complained of persistent intermittent cough with no production of sputum. Her appetite is stable.  I have reviewed the past medical history, past surgical history, social history and family history with the patient and they are unchanged from previous note.  ALLERGIES:  is allergic to ativan; midazolam; zolpidem tartrate; and neomycin-bacitracin zn-polymyx.  MEDICATIONS: Current facility-administered medications:0.9 %  sodium chloride infusion, , Intravenous, Continuous, Victorino December, MD, Last Rate: 75 mL/hr at 12/18/12 0008;  enoxaparin (LOVENOX) injection 40 mg, 40 mg, Subcutaneous, Q24H, Victorino December, MD, 40 mg at 12/17/12 2128;  estradiol (CLIMARA - Dosed in mg/24 hr) patch 0.1 mg, 0.1 mg, Transdermal, Weekly, Victorino December, MD guaiFENesin-dextromethorphan (ROBITUSSIN DM) 100-10 MG/5ML syrup 5 mL, 5 mL, Oral, Q4H PRN, Victorino December, MD, 5 mL at 12/16/12 2143;  linagliptin (TRADJENTA) tablet 5 mg, 5 mg, Oral, Daily, Victorino December, MD, 5 mg at 12/17/12 1009;  metFORMIN (GLUCOPHAGE) tablet 1,000 mg, 1,000 mg, Oral, BID WC, Victorino December, MD, 1,000  mg at 12/17/12 1701;  MethylTESTOSTERone TABS, , Oral, Daily, Victorino December, MD, 1.25 mg at 12/17/12 1641 ondansetron (ZOFRAN) injection 4 mg, 4 mg, Intravenous, Q8H PRN, Victorino December, MD, 4 mg at 12/16/12 1407;  ondansetron (ZOFRAN-ODT) disintegrating tablet 8 mg, 8 mg, Oral, Q8H PRN, Victorino December, MD, 8 mg at 12/17/12 1704;  oxyCODONE (Oxy IR/ROXICODONE) immediate release tablet 10 mg, 10 mg, Oral, Q4H PRN, Victorino December, MD, 10 mg at 12/18/12 0242 polyethylene glycol (MIRALAX / GLYCOLAX) packet 17 g, 17 g, Oral, Daily, Victorino December, MD, 17 g at 12/17/12 1009  REVIEW OF SYSTEMS:   Constitutional: Denies fevers, chills or abnormal weight loss Eyes: Denies blurriness of vision Ears, nose, mouth, throat, and face: Denies mucositis or sore throat Skin: Denies abnormal skin rashes Lymphatics: Denies new lymphadenopathy or easy bruising Neurological:Denies numbness, tingling or new weaknesses Behavioral/Psych: Mood is stable, no new changes  All other systems were reviewed with the patient and are negative.  PHYSICAL EXAMINATION: ECOG PERFORMANCE STATUS: 1 - Symptomatic but completely ambulatory  Filed Vitals:   12/18/12 0610  BP: 142/78  Pulse: 92  Temp: 97.6 F (36.4 C)  Resp: 18   GENERAL:alert, no distress and comfortable SKIN: skin color, texture, turgor are normal, no rashes or significant lesions EYES: normal, Conjunctiva are pink and non-injected, sclera clear OROPHARYNX:no exudate, no erythema and lips, buccal mucosa, and tongue normal  NECK: supple, thyroid normal size, non-tender, without nodularity LYMPH:  no palpable lymphadenopathy in the cervical, axillary or inguinal LUNGS: Reduced breath sounds predominantly on the right lung, at least 50% of the lower part of the lung has no breath sounds. Is  also dull on percussion. HEART: regular rate & rhythm and no murmurs and no lower extremity edema ABDOMEN:abdomen soft, no tenderness on the left lower program with no  rebound or guarding and normal bowel sounds Musculoskeletal:no cyanosis of digits and no clubbing  NEURO: alert & oriented x 3 with fluent speech, no focal motor/sensory deficits  LABORATORY DATA:  I have reviewed the data as listed    Component Value Date/Time   NA 120* 12/18/2012 0707   NA 121* 12/16/2012 0809   K 3.5 12/18/2012 0707   K 4.2 12/16/2012 0809   CL 86* 12/18/2012 0707   CO2 25 12/18/2012 0707   CO2 26 12/16/2012 0809   GLUCOSE 214* 12/18/2012 0707   GLUCOSE 294* 12/16/2012 0809   BUN 8 12/18/2012 0707   BUN 11.6 12/16/2012 0809   CREATININE 0.34* 12/18/2012 0707   CREATININE 0.6 12/16/2012 0809   CALCIUM 8.7 12/18/2012 0707   CALCIUM 9.5 12/16/2012 0809   PROT 5.4* 12/17/2012 0600   PROT 6.2* 12/16/2012 0809   ALBUMIN 2.0* 12/17/2012 0600   ALBUMIN 2.2* 12/16/2012 0809   AST 14 12/17/2012 0600   AST 16 12/16/2012 0809   ALT 13 12/17/2012 0600   ALT 15 12/16/2012 0809   ALKPHOS 59 12/17/2012 0600   ALKPHOS 74 12/16/2012 0809   BILITOT 0.3 12/17/2012 0600   BILITOT 0.39 12/16/2012 0809   GFRNONAA >90 12/18/2012 0707   GFRAA >90 12/18/2012 0707    Recent Labs Lab 12/16/12 0809  INR 1.20*   RADIOGRAPHIC STUDIES: I have personally reviewed the radiological images as listed and agreed with the findings in the report. Dg Chest 2 View  12/17/2012   CLINICAL DATA:  Cough. Shortness of breath. Right pleural effusion, with 1.5 L of fluid removed yesterday.  EXAM: CHEST  2 VIEW  COMPARISON:  12/16/2012  FINDINGS: Large right pleural effusion noted with only a small amount of aerated lung. The degree of right hemithoracic opacification appear is similar to the postprocedural radiograph.  Left-sided Port-A-Cath tip: SVC. Left lung appears clear. No cardiomegaly.  IMPRESSION: 1. Stable appearance of the chest, with large right pleural effusion and a relatively small amount of aerated lung on the right.   Electronically Signed   By: Herbie Baltimore   On: 12/17/2012 10:12   X-ray Chest Pa And  Lateral   12/16/2012   CLINICAL DATA:  Patient with pleural effusion.  EXAM: CHEST  2 VIEW  COMPARISON:  12/13/2012  FINDINGS: There is a left chest wall port a catheter. The tip is in the projection of the SVC. The heart size appears normal. There has been significant interval increase in volume of the right pleural effusion with associated diminished aeration to the right midlung and right upper lobe. The left lung appears clear.  IMPRESSION: 1. Significant interval increase in volume of the right pleural effusion.   Electronically Signed   By: Signa Kell M.D.   On: 12/16/2012 13:41   Ct Chest W Contrast  12/16/2012   CLINICAL DATA:  Stage IV rectal carcinoma. Enlarging lung mass.  EXAM: CT CHEST, ABDOMEN, AND PELVIS WITH CONTRAST  TECHNIQUE: Multidetector CT imaging of the chest, abdomen and pelvis was performed following the standard protocol during bolus administration of intravenous contrast.  CONTRAST:  OMNIPAQUE IOHEXOL 300 MG/ML  SOLN  COMPARISON:  Chest CT 12/13/2012, PET-CT scan 11/02/2012  FINDINGS: CT CHEST FINDINGS  There is a port in the anterior chest wall. No axillary or supraclavicular lymphadenopathy.  There  is interval increase in loculated pleural fluid within the right hemi thorax. The lower portion of the hemi thorax is near completely obliterated by the pleural fluid with atelectasis of the right lower lobe and right middle lobe. The upper lobe is also occupied by loculated fluid collection withatelectasis of the right upper lobe additionally. These findings have increased significantly from comparison CT of 12/13/2012. The right lower lobe pulmonary mass is difficult to define on the background of the large loculated fluid collections.  No mediastinal lymphadenopathy. No pericardial fluid. Small left small effusion which is non loculated.  CT ABDOMEN AND PELVIS FINDINGS  No focal hepatic lesion. The gallbladder, pancreas, spleen, adrenal glands, and kidneys are normal. The  stomach, small bowel and cecum are normal. Moderate volume of stool throughout the colon. Abdominal aorta is normal in caliber. No retroperitoneal or periportal lymphadenopathy.  The bladder is normal no pelvic lymphadenopathy. Review of bone windows demonstrates no aggressive osseous lesions. .  IMPRESSION: CT CHEST IMPRESSION  1. Progressively expanding loculated pleural fluid collections in the right hemi thorax with near complete filling of the lower portion of the hemothorax and significant compromise of the upper portion of the hemithorax.  2. Right lower lobe pulmonary mass is difficult to define on the background of expanding loculated fluid collections.  CT ABDOMEN AND PELVIS IMPRESSION  1. Moderate volume of stool throughout the colon suggest constipation or potentially distal obstruction from the rectal mass.  This was made a call report.   Electronically Signed   By: Genevive Bi M.D.   On: 12/16/2012 16:51   [place] Chest  12/16/2012   CLINICAL DATA:  Pleural effusion, lung metastases, history of rectal carcinoma.  EXAM: CHEST ULTRASOUND  COMPARISON:  CT 12/13/2012 and earlier studies  FINDINGS: Survey ultrasound of the chest was performed in contemplation of thoracentesis. Cavitary/ necrotic lung lesion and a small effusion identified. Thoracentesis was deferred due to concern of possible bronchopleural fistula. Images were reviewed with Dr. Bonnielee Haff by the PA at the time of the procedure.  IMPRESSION: Small effusion and adjacent necrotic/ cystic right lung mass. Thoracentesis deferred.   Electronically Signed   By: Oley Balm M.D.   On: 12/16/2012 21:32   Ct Abdomen Pelvis W Contrast  12/16/2012   CLINICAL DATA:  Stage IV rectal carcinoma. Enlarging lung mass.  EXAM: CT CHEST, ABDOMEN, AND PELVIS WITH CONTRAST  TECHNIQUE: Multidetector CT imaging of the chest, abdomen and pelvis was performed following the standard protocol during bolus administration of intravenous contrast.  CONTRAST:   OMNIPAQUE IOHEXOL 300 MG/ML  SOLN  COMPARISON:  Chest CT 12/13/2012, PET-CT scan 11/02/2012  FINDINGS: CT CHEST FINDINGS  There is a port in the anterior chest wall. No axillary or supraclavicular lymphadenopathy.  There is interval increase in loculated pleural fluid within the right hemi thorax. The lower portion of the hemi thorax is near completely obliterated by the pleural fluid with atelectasis of the right lower lobe and right middle lobe. The upper lobe is also occupied by loculated fluid collection withatelectasis of the right upper lobe additionally. These findings have increased significantly from comparison CT of 12/13/2012. The right lower lobe pulmonary mass is difficult to define on the background of the large loculated fluid collections.  No mediastinal lymphadenopathy. No pericardial fluid. Small left small effusion which is non loculated.  CT ABDOMEN AND PELVIS FINDINGS  No focal hepatic lesion. The gallbladder, pancreas, spleen, adrenal glands, and kidneys are normal. The stomach, small bowel and cecum are  normal. Moderate volume of stool throughout the colon. Abdominal aorta is normal in caliber. No retroperitoneal or periportal lymphadenopathy.  The bladder is normal no pelvic lymphadenopathy. Review of bone windows demonstrates no aggressive osseous lesions. .  IMPRESSION: CT CHEST IMPRESSION  1. Progressively expanding loculated pleural fluid collections in the right hemi thorax with near complete filling of the lower portion of the hemothorax and significant compromise of the upper portion of the hemithorax.  2. Right lower lobe pulmonary mass is difficult to define on the background of expanding loculated fluid collections.  CT ABDOMEN AND PELVIS IMPRESSION  1. Moderate volume of stool throughout the colon suggest constipation or potentially distal obstruction from the rectal mass.  This was made a call report.   Electronically Signed   By: Genevive Bi M.D.   On: 12/16/2012 16:51    Dg Chest Port 1 View  12/16/2012   CLINICAL DATA:  Status post right thoracentesis  EXAM: PORTABLE CHEST - 1 VIEW  COMPARISON:  12/16/12  FINDINGS: There is a left chest wall port a catheter with tip in the projection the SVC. A large right pleural effusion is again noted. There is significantly diminished aeration to the right lung. No pneumothorax is identified after right thoracentesis. The left lung is clear.  IMPRESSION: 1. No pneumothorax identified after right thoracentesis.  2. Persistent large right pleural effusion with diminished aeration to the right lower lobe   Electronically Signed   By: Signa Kell M.D.   On: 12/16/2012 18:19   ASSESSMENT: Rectal cancer, with a large pleural effusion and anemia as well as hyponatremia   PLAN:  #1 rectal cancer We'll defer chemotherapy until medically stable. We will continue aggressive supportive care #2 large pleural effusion She had thoracentesis 2 days ago but imaging studies do show persistent lung collapse and clinically she still had persistent reduced breath sounds on the right side. Would defer to a pulmonologist for further management. She does not require oxygen at this point #3 anemia This could be due to anemia chronic disease. There were no evidence of active bleeding. She'll receive 2 units of blood transfusion and her hemoglobin is stable today. We'll observe #4 hyponatremia This could be due to SIADH. I recommend fluid restriction of 1 L per day and we'll order additional labs for tomorrow for further evaluation. She is asymptomatic, and I would not recommend saline infusion. She would need to be observed closely digital high risk of seizure #5 DVT prophylaxis She will continue Lovenox #6 persistent cough This could be due to 2 persistent atelectasis on the right lung. I recommend incentive spirometry #7 intermittent abdominal pain This could be due to disease or chronic constipation. We will continue stool softeners. We will  continue oxycodone as needed  All questions were answered. The patient and plan discussed with [facility], Alorah Mcree  and she is in agreement with the aforementioned.

## 2012-12-18 NOTE — Progress Notes (Signed)
OFFICE PROGRESS NOTE  CC  [name], MD [address] [place] [address]  DIAGNOSIS: 67 year old female with new diagnosis of rectal cancer with lung metastasis   PRIOR THERAPY: #17/27/2014 with acute episode of bright red blood per rectum and large clot formation, prompting her to be evaluated at the ED. Of note,colonoscopy prior to this admission had been performed on 03/04/10(screening) which showed three polyps ,largest 8 mm, removed, as well as moderate diverticulosis in the sigmoid colon, otherwise normal exam . On presentation, she had Positive Guaiac and a rectal mass had been palpated on exam. On 7/29 she underwent colonoscopy (Dr. Marina Goodell) which revealed a bulky, circumferential, friable mass lesion in the rectum of about 4 cm above the dentate line, subtotally obstructing and would not permit passage of the colonoscope proximally. Multiple biopsies were taken.  #2 patient's pathology revealed consistent with adenocarcinoma with primary from rectal. Lung biopsy was performed also that revealed metastatic disease.  #3 patient was seen by Dr. Dorris Fetch for possible resection of the lung metastasis. He recommended we proceed with chemotherapy first to see if we could do both lung metastases and with eventual resection.  #4 patient is recommended to proceed with palliative chemotherapy consisting of FOLFOX. We will hold Avastin every 2 rectal bleeding.    CURRENT THERAPY:cycle 2 day 1 of modified FOLFOX 6  INTERVAL HISTORY: Anne [name] 67 y.o. female returns for followup visit prior to her chemotherapy.  She is doing well today.  She tolerated her chemotherapy well with the exception of the fatigue that she is experiencing.  She denies fevers, chills, nausea, vomiting, constipation, diarrhea, numbness, pain or any further concerns.   MEDICAL HISTORY: Past Medical History  Diagnosis Date  . Heart murmur, systolic 07/01/2011  . Postmenopausal HRT (hormone  replacement therapy) 07/01/2011    surgical menopause  . Hx of hyperglycemia 07/01/2011  . Hx of compression fracture of spine 1985    t12 after a  20 foot fall  . Hx of fracture of wrist     after fall   . 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 Hx of colonic polyps   . Early cataract     stoneburner   . High frequency hearing loss     [facility] followed   . Diabetes mellitus without complication   . Cancer     colon  . Skin cancer   . Complication of anesthesia     pt hadpanic attacks with versed, no versed    ALLERGIES:  is allergic to ativan; midazolam; zolpidem tartrate; and neomycin-bacitracin zn-polymyx.  MEDICATIONS:  No current facility-administered medications for this visit.   No current outpatient prescriptions on file.   Facility-Administered Medications Ordered in Other Visits  Medication Dose Route Frequency Provider Last Rate Last Dose  . 0.9 %  sodium chloride infusion   Intravenous Continuous Victorino December, MD 75 mL/hr at 12/18/12 0008    . enoxaparin (LOVENOX) injection 40 mg  40 mg Subcutaneous Q24H Victorino December, MD   40 mg at 12/17/12 2128  . estradiol (CLIMARA - Dosed in mg/24 hr) patch 0.1 mg  0.1 mg Transdermal Weekly Victorino December, MD      . guaiFENesin-dextromethorphan [facility] DM) 100-10 MG/5ML syrup 5 mL  5 mL Oral Q4H PRN Victorino December, MD   5 mL at 12/16/12 2143  . linagliptin (TRADJENTA) tablet 5 mg  5 mg Oral Daily Victorino December, MD  5 mg at 12/17/12 1009  . metFORMIN (GLUCOPHAGE) tablet 1,000 mg  1,000 mg Oral BID WC Victorino December, MD   1,000 mg at 12/17/12 1701  . MethylTESTOSTERone TABS   Oral Daily Victorino December, MD   1.25 mg at 12/17/12 1641  . ondansetron (ZOFRAN) injection 4 mg  4 mg Intravenous Q8H PRN Victorino December, MD   4 mg at 12/16/12 1407  . ondansetron (ZOFRAN-ODT) disintegrating tablet 8 mg  8 mg Oral Q8H PRN Victorino December, MD   8 mg at 12/17/12 1704  . oxyCODONE (Oxy IR/ROXICODONE) immediate release tablet 10 mg  10 mg  Oral Q4H PRN Victorino December, MD   10 mg at 12/17/12 2008  . polyethylene glycol (MIRALAX / GLYCOLAX) packet 17 g  17 g Oral Daily Victorino December, MD   17 g at 12/17/12 1009    SURGICAL HISTORY:  Past Surgical History  Procedure Laterality Date  . Tonsillectomy  1952  . Adenoidectomy  1952  . Wrist surgery  1957    left reduction  . Compression fraction  1985    T12  . Anterior cruciate ligament repair  1993    right Murphy  . Cosmetic surgery  1999-2007    holderness blepharoplasty facial rhytidectomy mastopexy abdominoplasty   . Dental implant    . Dilation and curettage of uterus  1977  . Total abdominal hysterectomy  2005    with cystocele repair   . Colonoscopy N/A 10/24/2012    Procedure: COLONOSCOPY;  Surgeon: Hilarie Fredrickson, MD;  Location: [facility] ENDOSCOPY;  Service: Endoscopy;  Laterality: N/A;  . Colonoscopy N/A 10/25/2012    Procedure: COLONOSCOPY;  Surgeon: Hilarie Fredrickson, MD;  Location: [facility] ENDOSCOPY;  Service: Endoscopy;  Laterality: N/A;  . Video bronchoscopy Bilateral 10/27/2012    Procedure: VIDEO BRONCHOSCOPY WITH FLUORO;  Surgeon: Coralyn Helling, MD;  Location: [facility] ENDOSCOPY;  Service: Endoscopy;  Laterality: Bilateral;  . Portacath placement Left 11/21/2012    Procedure: INSERTION PORT-A-CATH;  Surgeon: Loreli Slot, MD;  Location: [facility] OR;  Service: Thoracic;  Laterality: Left;    REVIEW OF SYSTEMS:  Pertinent items are noted in HPI.   HEALTH MAINTENANCE:   PHYSICAL EXAMINATION: Blood pressure 123/88, pulse 75, temperature 97.9 F (36.6 C), temperature source Oral, resp. rate 18, height 5\' 4"  (1.626 m), weight 133 lb 12.8 oz (60.691 kg). Body mass index is 22.96 kg/(m^2). General: Patient is a well appearing female in no acute distress HEENT: PERRLA, sclerae anicteric no conjunctival pallor, MMM Neck: supple, no palpable adenopathy Lungs: clear to auscultation bilaterally, no wheezes, rhonchi, or rales Cardiovascular: regular rate rhythm, S1, S2, no murmurs, rubs  or gallops Abdomen: Soft, non-tender, non-distended, normoactive bowel sounds, no HSM Extremities: warm and well perfused, no clubbing, cyanosis, or edema Skin: No rashes or lesions Neuro: Non-focal ECOG PERFORMANCE STATUS: 0 - Asymptomatic  LABORATORY DATA: Lab Results  Component Value Date   WBC 5.0 12/17/2012   HGB 8.6* 12/17/2012   HCT 26.2* 12/17/2012   MCV 71.8* 12/17/2012   PLT 344 12/17/2012      Chemistry      Component Value Date/Time   NA 120* 12/17/2012 0600   NA 121* 12/16/2012 0809   K 3.9 12/17/2012 0600   K 4.2 12/16/2012 0809   CL 85* 12/17/2012 0600   CO2 26 12/17/2012 0600   CO2 26 12/16/2012 0809   BUN 9 12/17/2012 0600   BUN 11.6 12/16/2012 0809   CREATININE  0.37* 12/17/2012 0600   CREATININE 0.6 12/16/2012 0809      Component Value Date/Time   CALCIUM 8.8 12/17/2012 0600   CALCIUM 9.5 12/16/2012 0809   ALKPHOS 59 12/17/2012 0600   ALKPHOS 74 12/16/2012 0809   AST 14 12/17/2012 0600   AST 16 12/16/2012 0809   ALT 13 12/17/2012 0600   ALT 15 12/16/2012 0809   BILITOT 0.3 12/17/2012 0600   BILITOT 0.39 12/16/2012 0809       RADIOGRAPHIC STUDIES:  Dg Chest 2 View Within Previous 72 Hours.  Films Obtained On Friday Are Acceptable For Monday And Tuesday Cases  11/18/2012   CLINICAL DATA:  67 year old female preoperative study. Lung mass. Colon cancer.  EXAM: CHEST  2 VIEW  COMPARISON:  PET-CT 11/02/2012 and earlier.  FINDINGS: 5 cm right lower lobe lung mass re- identified. Radiographically this appears not significantly changed since 10/25/2012. The 6 mm cavitary right upper lobe pulmonary nodule is faint but visible. Stable lung volumes. Stable cardiac size and mediastinal contours. The left lung is clear. No pneumothorax or effusion. Stable visualized osseous structures. Chronic T12 compression fracture. Flowing thoracic osteophytes.  IMPRESSION: Known right lung lesions. No new cardiopulmonary abnormality.   Electronically Signed   By: Augusto Gamble   On: 11/18/2012 15:53    Nm Pet Image Initial (pi) Skull Base To Thigh  11/02/2012   *RADIOLOGY REPORT*  Clinical Data:  Initial treatment strategy for rectal cancer. Circumferential rectal cancer 4 cm from the anal verge.  4 cm right lung mass.  NUCLEAR MEDICINE PET WHOLE BODY  Fasting Blood Glucose:  123  Technique:  19.1 mCi F-18 FDG was injected intravenously. CT data was obtained and used for attenuation correction and anatomic localization only.  (This was not acquired as a diagnostic CT examination.) Additional exam technical data entered on technologist worksheet.  Comparison:  CT scan from 10/25/2012  Findings:  Head/Neck:   No hypermetabolic lymph nodes in the neck.  Chest:  The previously characterized 5 cm right lower lobe pulmonary mass is hypermetabolic with SUV max = 8.  No evidence for hypermetabolic lymphadenopathy in the mediastinum or either hilum. No other hypermetabolic lung lesions are evident although the patient does have bilateral small pulmonary parenchymal nodules (see posterior right upper lobe on image 73 and posterior right lower lobe on image 78).  Both of these small nodules are below the accepted size threshold for reliable resolution on PET imaging.  Abdomen/Pelvis:  No abnormal hypermetabolism is identified within the liver.  No hypermetabolic lymphadenopathy in the abdomen.  There is a prominent amount of physiologic F D G uptake in the colon, but this is associated with hypermetabolic uptake in a region of circumferential rectal wall thickening.  SUV max = 9 for the rectal hypermetabolism.  Skeleton:  No focal hypermetabolic activity to suggest skeletal metastasis.  IMPRESSION: Hypermetabolism in the area of apparent circumferential rectal wall thickening associate with hypermetabolic 5 cm right lower lobe pulmonary mass.  6 mm cavitary pulmonary nodule the posterior right upper lobe is not hypermetabolic on PET imaging, but is below the accepted size threshold for reliable resolution on PET.  A  second tiny right lower lobe nodule is also noted.   Original Report Authenticated By: Kennith Center, M.D.   Dg Fluoro Guide Cv Line-no Report  11/21/2012   CLINICAL DATA: Done in OR # 10,Intraoperative insertion port a cath   FLOURO GUIDE CV LINE  Fluoroscopy was utilized by the requesting physician.  No radiographic  interpretation.     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 She is in oligo metastases patient is very interested in being aggressive with her disease.She will receive a total of 6 cycles of FOLFOX 6. This will then be followed by radiation with radiosensitizing Xeloda. She will then finish up another 6 cycles of FOLFOX 6.  #2 once she completes this then she will go on to surgery for the lung metastases resection.   PLAN:   #1proceed with cycle 2 of FOLFOX 6  #2 patient will return in one week's time for followup   All questions were answered. The patient knows to call the clinic with any problems, questions or concerns. We can certainly see the patient much sooner if necessary.  I spent 25 minutes counseling the patient face to face. The total time spent in the appointment was 30 minutes.  Drue Second, MD Medical/Oncology [facility] [phone] (beeper) [phone] (Office)

## 2012-12-18 NOTE — Progress Notes (Signed)
Patient was admitted directly from the clinic. A full history and physical was dictated as part of her admission.

## 2012-12-18 NOTE — Progress Notes (Signed)
  Subjective: Feels pretty well today' Able to lie flat on back  Objective: Vital signs in last 24 hours: Temp:  [97.6 F (36.4 C)-99.8 F (37.7 C)] 97.6 F (36.4 C) (09/21 0610) Pulse Rate:  [88-113] 92 (09/21 0610) Cardiac Rhythm:  [-]  Resp:  [16-21] 18 (09/21 0610) BP: (130-150)/(62-78) 142/78 mmHg (09/21 0610) SpO2:  [90 %-97 %] 92 % (09/21 0610) Weight:  [145 lb 8.1 oz (66 kg)] 145 lb 8.1 oz (66 kg) (09/21 0610)  Hemodynamic parameters for last 24 hours:    Intake/Output from previous day: 09/20 0701 - 09/21 0700 In: 1589 [P.O.:480; Blood:1109] Out: 900 [Urine:900] Intake/Output this shift:    General appearance: alert and no distress Lungs: diminished breath sounds right side, a little diminished from yesterday, still improved from 2 days ago  Lab Results:  Recent Labs  12/17/12 0600 12/18/12 0707  WBC 5.0 5.1  HGB 8.6* 11.0*  HCT 26.2* 32.3*  PLT 344 314   BMET:  Recent Labs  12/17/12 0600 12/18/12 0707  NA 120* 120*  K 3.9 3.5  CL 85* 86*  CO2 26 25  GLUCOSE 186* 214*  BUN 9 8  CREATININE 0.37* 0.34*  CALCIUM 8.8 8.7    PT/INR:  Recent Labs  12/16/12 0809  INR 1.20*   ABG No results found for this basename: phart, pco2, po2, hco3, tco2, acidbasedef, o2sat   CBG (last 3)   Recent Labs  12/17/12 2244 12/18/12 0745 12/18/12 1202  GLUCAP 244* 181* 321*    Assessment/Plan: S/P   right pleural effusion Studies c/w exudate, no organisms seen on GS Cytology still pending CXR yesterday was improved after thoracentesis but still has a large loculated residual effusion Await cytology results I think she'll need a VATS to drain the effusion and reexpand the lung- tentatively planning Wednesday   LOS: 2 days    HENDRICKSON,STEVEN C 12/18/2012

## 2012-12-18 NOTE — Progress Notes (Signed)
Sister and daughter at bedside. OOB to the bathroom with standby assist, Tolerates well.

## 2012-12-18 NOTE — Progress Notes (Signed)
OFFICE PROGRESS NOTE  CC  Anne Harp, MD [address] [place] [address]  DIAGNOSIS: 67 year old female with new diagnosis of rectal cancer with lung metastasis   PRIOR THERAPY: #17/27/2014 with acute episode of bright red blood per rectum and large clot formation, prompting her to be evaluated at the ED. Of note,colonoscopy prior to this admission had been performed on 03/04/10(screening) which showed three polyps ,largest 8 mm, removed, as well as moderate diverticulosis in the sigmoid colon, otherwise normal exam . On presentation, she had Positive Guaiac and a rectal mass had been palpated on exam. On 7/29 she underwent colonoscopy (Dr. Marina Goodell) which revealed a bulky, circumferential, friable mass lesion in the rectum of about 4 cm above the dentate line, subtotally obstructing and would not permit passage of the colonoscope proximally. Multiple biopsies were taken.  #2 patient's pathology revealed consistent with adenocarcinoma with primary from rectal. Lung biopsy was performed also that revealed metastatic disease.  #3 patient was seen by Dr. Dorris Fetch for possible resection of the lung metastasis. He recommended we proceed with chemotherapy first to see if we could do both lung metastases and with eventual resection.  #4 patient is recommended to proceed with palliative chemotherapy consisting of FOLFOX. We will hold Avastin every 2 rectal bleeding.    CURRENT THERAPY:cycle 2 day 8 of modified FOLFOX 6  INTERVAL HISTORY: Anne [name] 67 y.o. female returns for followup visit one week after her last chemotherapy. Patient has had significant shortness of breath she is not able to lay down flat. She is also coughing and wheezing. She has not been eating well and seems to continue to lose weight. Her family also tells her that she is not drinking well. She has not had any fevers chills or night sweats she does have some peripheral paresthesias now.   MEDICAL  HISTORY: Past Medical History  Diagnosis Date  . Heart murmur, systolic 07/01/2011  . Postmenopausal HRT (hormone replacement therapy) 07/01/2011    surgical menopause  . Hx of hyperglycemia 07/01/2011  . Hx of compression fracture of spine 1985    t12 after a  20 foot fall  . Hx of fracture of wrist     after fall   . 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 Hx of colonic polyps   . Early cataract     stoneburner   . High frequency hearing loss     [facility] followed   . Diabetes mellitus without complication   . Cancer     colon  . Skin cancer   . Complication of anesthesia     pt hadpanic attacks with versed, no versed    ALLERGIES:  is allergic to ativan; midazolam; zolpidem tartrate; and neomycin-bacitracin zn-polymyx.  MEDICATIONS:  No current facility-administered medications for this visit.   No current outpatient prescriptions on file.   Facility-Administered Medications Ordered in Other Visits  Medication Dose Route Frequency Provider Last Rate Last Dose  . 0.9 %  sodium chloride infusion   Intravenous Continuous Victorino December, MD 75 mL/hr at 12/18/12 0008    . enoxaparin (LOVENOX) injection 40 mg  40 mg Subcutaneous Q24H Victorino December, MD   40 mg at 12/17/12 2128  . estradiol (CLIMARA - Dosed in mg/24 hr) patch 0.1 mg  0.1 mg Transdermal Weekly Victorino December, MD   0.1 mg at 12/18/12 1115  . guaiFENesin-dextromethorphan (ROBITUSSIN DM) 100-10 MG/5ML syrup 5 mL  5 mL Oral Q4H  PRN Victorino December, MD   5 mL at 12/16/12 2143  . linagliptin (TRADJENTA) tablet 5 mg  5 mg Oral Daily Victorino December, MD   5 mg at 12/18/12 1114  . metFORMIN (GLUCOPHAGE) tablet 1,000 mg  1,000 mg Oral BID WC Victorino December, MD   1,000 mg at 12/18/12 1113  . MethylTESTOSTERone TABS   Oral Daily Victorino December, MD      . ondansetron [facility]) injection 4 mg  4 mg Intravenous Q8H PRN Victorino December, MD   4 mg at 12/18/12 1433  . ondansetron (ZOFRAN-ODT) disintegrating tablet 8 mg  8 mg Oral Q8H PRN  Victorino December, MD   8 mg at 12/17/12 1704  . oxyCODONE (Oxy IR/ROXICODONE) immediate release tablet 10 mg  10 mg Oral Q4H PRN Victorino December, MD   10 mg at 12/18/12 1128  . polyethylene glycol (MIRALAX / GLYCOLAX) packet 17 g  17 g Oral Daily Victorino December, MD   17 g at 12/18/12 1034    SURGICAL HISTORY:  Past Surgical History  Procedure Laterality Date  . Tonsillectomy  1952  . Adenoidectomy  1952  . Wrist surgery  1957    left reduction  . Compression fraction  1985    T12  . Anterior cruciate ligament repair  1993    right Murphy  . Cosmetic surgery  1999-2007    holderness blepharoplasty facial rhytidectomy mastopexy abdominoplasty   . Dental implant    . Dilation and curettage of uterus  1977  . Total abdominal hysterectomy  2005    with cystocele repair   . Colonoscopy N/A 10/24/2012    Procedure: COLONOSCOPY;  Surgeon: Hilarie Fredrickson, MD;  Location: [facility] ENDOSCOPY;  Service: Endoscopy;  Laterality: N/A;  . Colonoscopy N/A 10/25/2012    Procedure: COLONOSCOPY;  Surgeon: Hilarie Fredrickson, MD;  Location: [facility] ENDOSCOPY;  Service: Endoscopy;  Laterality: N/A;  . Video bronchoscopy Bilateral 10/27/2012    Procedure: VIDEO BRONCHOSCOPY WITH FLUORO;  Surgeon: Coralyn Helling, MD;  Location: [facility] ENDOSCOPY;  Service: Endoscopy;  Laterality: Bilateral;  . Portacath placement Left 11/21/2012    Procedure: INSERTION PORT-A-CATH;  Surgeon: Loreli Slot, MD;  Location: [facility] OR;  Service: Thoracic;  Laterality: Left;    REVIEW OF SYSTEMS:  Pertinent items are noted in HPI.   HEALTH MAINTENANCE:   PHYSICAL EXAMINATION: Blood pressure 160/76, pulse 108, temperature 98.5 F (36.9 C), temperature source Oral, resp. rate 18, height 5\' 4"  (1.626 m), weight 132 lb 3.2 oz (59.966 kg). Body mass index is 22.68 kg/(m^2). General: Patient is a well appearing female in no acute distress HEENT: PERRLA, sclerae anicteric no conjunctival pallor, MMM Neck: supple, no palpable adenopathy Lungs: clear to  auscultation bilaterally, no wheezes, rhonchi, or rales Cardiovascular: regular rate rhythm, S1, S2, no murmurs, rubs or gallops Abdomen: Soft, non-tender, non-distended, normoactive bowel sounds, no HSM Extremities: warm and well perfused, no clubbing, cyanosis, or edema Skin: No rashes or lesions Neuro: Non-focal ECOG PERFORMANCE STATUS: 0 - Asymptomatic  LABORATORY DATA: Lab Results  Component Value Date   WBC 5.1 12/18/2012   HGB 11.0* 12/18/2012   HCT 32.3* 12/18/2012   MCV 71.0* 12/18/2012   PLT 314 12/18/2012      Chemistry      Component Value Date/Time   NA 120* 12/18/2012 0707   NA 121* 12/16/2012 0809   K 3.5 12/18/2012 0707   K 4.2 12/16/2012 0809   CL 86* 12/18/2012 1610  CO2 25 12/18/2012 0707   CO2 26 12/16/2012 0809   BUN 8 12/18/2012 0707   BUN 11.6 12/16/2012 0809   CREATININE 0.34* 12/18/2012 0707   CREATININE 0.6 12/16/2012 0809      Component Value Date/Time   CALCIUM 8.7 12/18/2012 0707   CALCIUM 9.5 12/16/2012 0809   ALKPHOS 59 12/17/2012 0600   ALKPHOS 74 12/16/2012 0809   AST 14 12/17/2012 0600   AST 16 12/16/2012 0809   ALT 13 12/17/2012 0600   ALT 15 12/16/2012 0809   BILITOT 0.3 12/17/2012 0600   BILITOT 0.39 12/16/2012 0809       RADIOGRAPHIC STUDIES:  Dg Chest 2 View Within Previous 72 Hours.  Films Obtained On Friday Are Acceptable For Monday And Tuesday Cases  11/18/2012   CLINICAL DATA:  67 year old female preoperative study. Lung mass. Colon cancer.  EXAM: CHEST  2 VIEW  COMPARISON:  PET-CT 11/02/2012 and earlier.  FINDINGS: 5 cm right lower lobe lung mass re- identified. Radiographically this appears not significantly changed since 10/25/2012. The 6 mm cavitary right upper lobe pulmonary nodule is faint but visible. Stable lung volumes. Stable cardiac size and mediastinal contours. The left lung is clear. No pneumothorax or effusion. Stable visualized osseous structures. Chronic T12 compression fracture. Flowing thoracic osteophytes.  IMPRESSION: Known  right lung lesions. No new cardiopulmonary abnormality.   Electronically Signed   By: Augusto Gamble   On: 11/18/2012 15:53   Nm Pet Image Initial (pi) Skull Base To Thigh  11/02/2012   *RADIOLOGY REPORT*  Clinical Data:  Initial treatment strategy for rectal cancer. Circumferential rectal cancer 4 cm from the anal verge.  4 cm right lung mass.  NUCLEAR MEDICINE PET WHOLE BODY  Fasting Blood Glucose:  123  Technique:  19.1 mCi F-18 FDG was injected intravenously. CT data was obtained and used for attenuation correction and anatomic localization only.  (This was not acquired as a diagnostic CT examination.) Additional exam technical data entered on technologist worksheet.  Comparison:  CT scan from 10/25/2012  Findings:  Head/Neck:   No hypermetabolic lymph nodes in the neck.  Chest:  The previously characterized 5 cm right lower lobe pulmonary mass is hypermetabolic with SUV max = 8.  No evidence for hypermetabolic lymphadenopathy in the mediastinum or either hilum. No other hypermetabolic lung lesions are evident although the patient does have bilateral small pulmonary parenchymal nodules (see posterior right upper lobe on image 73 and posterior right lower lobe on image 78).  Both of these small nodules are below the accepted size threshold for reliable resolution on PET imaging.  Abdomen/Pelvis:  No abnormal hypermetabolism is identified within the liver.  No hypermetabolic lymphadenopathy in the abdomen.  There is a prominent amount of physiologic F D G uptake in the colon, but this is associated with hypermetabolic uptake in a region of circumferential rectal wall thickening.  SUV max = 9 for the rectal hypermetabolism.  Skeleton:  No focal hypermetabolic activity to suggest skeletal metastasis.  IMPRESSION: Hypermetabolism in the area of apparent circumferential rectal wall thickening associate with hypermetabolic 5 cm right lower lobe pulmonary mass.  6 mm cavitary pulmonary nodule the posterior right upper lobe  is not hypermetabolic on PET imaging, but is below the accepted size threshold for reliable resolution on PET.  A second tiny right lower lobe nodule is also noted.   Original Report Authenticated By: Kennith Center, M.D.   Dg Fluoro Guide Cv Line-no Report  11/21/2012   CLINICAL DATA: Done in  OR # 10,Intraoperative insertion port a cath   FLOURO GUIDE CV LINE  Fluoroscopy was utilized by the requesting physician.  No radiographic  interpretation.     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 She is in oligo metastases patient is very interested in being aggressive with her disease.She will receive a total of 6 cycles of FOLFOX 6. This will then be followed by radiation with radiosensitizing Xeloda. She will then finish up another 6 cycles of FOLFOX 6.  #2 once she completes this then she will go on to surgery for the lung metastases resection.   PLAN:  #1 proceed with IV fluids for dehydration.  #2 continue hydrocodone for pain.  #3 we will obtain a CT angiogram to rule out pulmonary embolism and in the lung pathology.  #4 she will be seen back next week or sooner if need arises  All questions were answered. The patient knows to call the clinic with any problems, questions or concerns. We can certainly see the patient much sooner if necessary.  I spent 25 minutes counseling the patient face to face. The total time spent in the appointment was 30 minutes.  Drue Second, MD Medical/Oncology [facility] [phone] (beeper) [phone] (Office)

## 2012-12-19 LAB — CBC WITH DIFFERENTIAL/PLATELET
Basophils Absolute: 0.3 10*3/uL — ABNORMAL HIGH (ref 0.0–0.1)
Basophils Relative: 3 % — ABNORMAL HIGH (ref 0–1)
Eosinophils Relative: 0 % (ref 0–5)
HCT: 32.8 % — ABNORMAL LOW (ref 36.0–46.0)
Hemoglobin: 11.2 g/dL — ABNORMAL LOW (ref 12.0–15.0)
Lymphocytes Relative: 20 % (ref 12–46)
Lymphs Abs: 2 10*3/uL (ref 0.7–4.0)
Monocytes Relative: 28 % — ABNORMAL HIGH (ref 3–12)
Neutro Abs: 5 10*3/uL (ref 1.7–7.7)
Platelets: 310 10*3/uL (ref 150–400)
RBC: 4.6 MIL/uL (ref 3.87–5.11)
RDW: 16.5 % — ABNORMAL HIGH (ref 11.5–15.5)
WBC Morphology: INCREASED
WBC: 10.2 10*3/uL (ref 4.0–10.5)

## 2012-12-19 LAB — TYPE AND SCREEN

## 2012-12-19 LAB — COMPREHENSIVE METABOLIC PANEL
ALT: 12 U/L (ref 0–35)
AST: 28 U/L (ref 0–37)
Alkaline Phosphatase: 55 U/L (ref 39–117)
BUN: 5 mg/dL — ABNORMAL LOW (ref 6–23)
CO2: 26 mEq/L (ref 19–32)
GFR calc Af Amer: >90 mL/min (ref >90)
GFR calc non Af Amer: >90 mL/min (ref >90)
Glucose, Bld: 201 mg/dL — ABNORMAL HIGH (ref 70–99)
Potassium: 4 mEq/L (ref 3.5–5.1)
Sodium: 123 mEq/L — ABNORMAL LOW (ref 135–145)
Total Bilirubin: 0.2 mg/dL — ABNORMAL LOW (ref 0.3–1.2)
Total Protein: 5.7 g/dL — ABNORMAL LOW (ref 6.0–8.3)

## 2012-12-19 LAB — GLUCOSE, CAPILLARY
Glucose-Capillary: 278 mg/dL — ABNORMAL HIGH (ref 70–99)
Glucose-Capillary: 248 mg/dL — ABNORMAL HIGH (ref 70–99)

## 2012-12-19 NOTE — Progress Notes (Signed)
Inpatient Diabetes Program Recommendations  AACE/ADA: New Consensus Statement on Inpatient Glycemic Control (2013)  Target Ranges:  Prepandial:   less than 140 mg/dL      Peak postprandial:   less than 180 mg/dL (1-2 hours)      Critically ill patients:  140 - 180 mg/dL  Results for REMEDIOS, MCKONE (MRN [id number]) as of 12/19/2012 10:54  Ref. Range 12/17/2012 08:21 12/17/2012 12:35 12/17/2012 17:14 12/17/2012 22:44  Glucose-Capillary Latest Range: 70-99 mg/dL 130 (H) 865 (H) 784 (H) 244 (H)   Results for ALLEEN, KEHM (MRN [id number]) as of 12/19/2012 10:54  Ref. Range 12/18/2012 07:45 12/18/2012 12:02 12/18/2012 17:36 12/18/2012 22:14  Glucose-Capillary Latest Range: 70-99 mg/dL 132 (H) 440 (H) 102 (H) 229 (H)  Results for BRINKLEY, PEET (MRN [id number]) as of 12/19/2012 10:54  Ref. Range 12/19/2012 07:27  Glucose-Capillary Latest Range: 70-99 mg/dL 347 (H)    Inpatient Diabetes Program Recommendations Correction (SSI): Please consider ordering CBGs with Novolog sensitive correction ACHS while inpatient.  Note: Patient has a history of diabetes and takes Metformin 1000 mg BID and Januvia 100 mg daily as an outpatient for diabetes management.  Currently, patient is ordered to receive Metformin 1000 mg BID and Tradjenta 5mg  daily for inpatient glycemic control.  Blood glucose ranged from 177-251 mg/dl on 4/25, 956-387 mg/dl on 5/64, and fasting glucose this morning was 181 mg/dl.  Please order CBGs with Novolog sensitive correction ACHS while inpatient.  Also, if appropriate, may want to change diet to Carb Modified diabetic diet.  Will continue to follow.  Thanks, Orlando Penner, RN, MSN, CCRN Diabetes Coordinator Inpatient Diabetes Program [phone] (Team Pager) [phone] (AP office) [phone] [facility] office)

## 2012-12-19 NOTE — Progress Notes (Signed)
Pt. oob ambulating to the br,tol. Well. Appetite fair. Pt to have CXR in the AM. Also she is scheduled to go to [facility] Wednesday AM for Rt. VATS to drain effusion and get Lung rexpanded. Pt asked to have her prescription refilled for Methyltestosterone Tabs and have her family bring it in. She took the last pill today from her own prescription.

## 2012-12-19 NOTE — Progress Notes (Signed)
DIAGNOSIS: Colon cancer, with large pleural effusion status post thoracentesis, and anemia status post transfusion.  INTERVAL HISTORY: Anne [name] 67 y.o. female is seen today in the hospital. She underwent thoracentesis 3 days ago with marked improvement in her shortness of breath. She also received 2 units of blood transfusion which she tolerated well without transfusion reactions. Overall, his symptoms of shortness of breath have markedly improved.  She is awaiting word from Dr. Dorris [name] about a thoracentesis under anesthesia to attempt to drain loculated effusions.  She has had no fevers, chills. She is eating, drinking, and walking without difficulty.  She continues to have bowel movements, they are normal.  She is feeling otherwise well, and w/o questions, concerns.     ALLERGIES:  is allergic to ativan; midazolam; zolpidem tartrate; and neomycin-bacitracin zn-polymyx.  MEDICATIONS: Current facility-administered medications:0.9 %  sodium chloride infusion, , Intravenous, Continuous, Anne December, MD, Last Rate: 75 mL/hr at 12/18/12 2351;  enoxaparin (LOVENOX) injection 40 mg, 40 mg, Subcutaneous, Q24H, Anne December, MD, 40 mg at 12/18/12 2347;  estradiol (CLIMARA - Dosed in mg/24 hr) patch 0.1 mg, 0.1 mg, Transdermal, Weekly, Anne December, MD, 0.1 mg at 12/18/12 1115 guaiFENesin-dextromethorphan (ROBITUSSIN DM) 100-10 MG/5ML syrup 5 mL, 5 mL, Oral, Q4H PRN, Anne December, MD, 5 mL at 12/18/12 2347;  linagliptin (TRADJENTA) tablet 5 mg, 5 mg, Oral, Daily, Anne December, MD, 5 mg at 12/19/12 1059;  metFORMIN (GLUCOPHAGE) tablet 1,000 mg, 1,000 mg, Oral, BID WC, Anne December, MD, 1,000 mg at 12/19/12 4098;  MethylTESTOSTERone TABS, , Oral, Daily, Anne December, MD ondansetron [facility]) injection 4 mg, 4 mg, Intravenous, Q8H PRN, Anne December, MD, 4 mg at 12/18/12 1433;  ondansetron (ZOFRAN-ODT) disintegrating tablet 8 mg, 8 mg, Oral, Q8H PRN, Anne December, MD, 8 mg at 12/17/12 1704;   oxyCODONE (Oxy IR/ROXICODONE) immediate release tablet 10 mg, 10 mg, Oral, Q4H PRN, Anne December, MD, 10 mg at 12/19/12 0659 polyethylene glycol (MIRALAX / GLYCOLAX) packet 17 g, 17 g, Oral, Daily, Anne December, MD, 17 g at 12/18/12 1034  REVIEW OF SYSTEMS:   Constitutional: Denies fevers, chills or abnormal weight loss Eyes: Denies blurriness of vision Ears, nose, mouth, throat, and face: Denies mucositis or sore throat Skin: Denies abnormal skin rashes Lymphatics: Denies new lymphadenopathy or easy bruising Neurological:Denies numbness, tingling or new weaknesses Behavioral/Psych: Mood is stable, no new changes  All other systems were reviewed with the patient and are negative.  PHYSICAL EXAMINATION: ECOG PERFORMANCE STATUS: 1 - Symptomatic but completely ambulatory  Filed Vitals:   12/19/12 0542  BP: 140/70  Pulse: 91  Temp: 98.1 F (36.7 C)  Resp: 16   GENERAL:alert, no distress and comfortable, slightly tachypneic on exam 22 SKIN: skin color, texture, turgor are normal, no rashes or significant lesions EYES: normal, Conjunctiva are pink and non-injected, sclera clear OROPHARYNX:no exudate, no erythema and lips, buccal mucosa, and tongue normal  NECK: supple, thyroid normal size, non-tender, without nodularity LYMPH:  no palpable lymphadenopathy in the cervical, axillary or inguinal LUNGS: Reduced breath sounds predominantly on the right lung, at least 50% of the lower part of the lung has no breath sounds. Is also dull on percussion. HEART: regular rate & rhythm and no murmurs and no lower extremity edema ABDOMEN:abdomen soft, no tenderness on the left lower program with no rebound or guarding and normal bowel sounds Musculoskeletal:no cyanosis of digits and no clubbing  NEURO: alert & oriented x 3  with fluent speech, no focal motor/sensory deficits  LABORATORY DATA:  I have reviewed the data as listed    Component Value Date/Time   NA 123* 12/19/2012 0640   NA 121*  12/16/2012 0809   K 4.0 12/19/2012 0640   K 4.2 12/16/2012 0809   CL 87* 12/19/2012 0640   CO2 26 12/19/2012 0640   CO2 26 12/16/2012 0809   GLUCOSE 201* 12/19/2012 0640   GLUCOSE 294* 12/16/2012 0809   BUN 5* 12/19/2012 0640   BUN 11.6 12/16/2012 0809   CREATININE 0.29* 12/19/2012 0640   CREATININE 0.6 12/16/2012 0809   CALCIUM 8.6 12/19/2012 0640   CALCIUM 9.5 12/16/2012 0809   PROT 5.7* 12/19/2012 0640   PROT 6.2* 12/16/2012 0809   ALBUMIN 2.0* 12/19/2012 0640   ALBUMIN 2.2* 12/16/2012 0809   AST 28 12/19/2012 0640   AST 16 12/16/2012 0809   ALT 12 12/19/2012 0640   ALT 15 12/16/2012 0809   ALKPHOS 55 12/19/2012 0640   ALKPHOS 74 12/16/2012 0809   BILITOT 0.2* 12/19/2012 0640   BILITOT 0.39 12/16/2012 0809   GFRNONAA >90 12/19/2012 0640   GFRAA >90 12/19/2012 0640    Recent Labs Lab 12/16/12 0809  INR 1.20*   RADIOGRAPHIC STUDIES: I have personally reviewed the radiological images as listed and agreed with the findings in the report. No results found. ASSESSMENT: Rectal cancer, with a large pleural effusion and anemia as well as hyponatremia   PLAN:  #1 rectal cancer: Patient will not receive chemotherapy until medically stable.  She will continue to receive full supportive care.    #2 large pleural effusion: Much improved since thoracentesis, continue to await further recommendations from Dr. Dorris [name].    #3 anemia: Her hemoglobin is much improved, she is symptomatically much better since the transfusion.    #4 hyponatremia:  Her sodium is slightly improved today.  We will continue to follow.    #5 DVT prophylaxis:  She will continue Lovenox  #6 persistent cough:  This could be due to 2 persistent atelectasis on the right lung. I recommend incentive spirometry  #7 intermittent abdominal pain:  This could be due to disease or chronic constipation. We will continue stool softeners. We will continue oxycodone as needed  Anne Layman C. Lyn Hollingshead, NP Medical Oncology [facility] Phone: [phone]  ATTENDING'S ATTESTATION:  I personally reviewed patient's chart, examined patient myself, formulated the treatment plan as followed.    Patient to have a VATS procedure on Wednesday.   We will continue supportive care as noted above.  Drue Second, MD Medical/Oncology [facility] [phone] (beeper) [phone] (Office)

## 2012-12-19 NOTE — Progress Notes (Signed)
Feels well today  Still has a productive cough- nonpurulent sputum  BP 140/70  Pulse 91  Temp(Src) 98.1 F (36.7 C) (Oral)  Resp 16  Ht 5' 4.5" (1.638 m)  Wt 142 lb 3.2 oz (64.5 kg)  BMI 24.04 kg/m2  SpO2 92%  Cytology still pending  We will plan to take her to [facility] on Wednesday for Right VATS to drain the effusion and get the lung rexpanded  Recheck CXR in AM

## 2012-12-20 ENCOUNTER — Inpatient Hospital Stay (HOSPITAL_COMMUNITY): Payer: 59

## 2012-12-20 ENCOUNTER — Other Ambulatory Visit: Payer: 59 | Admitting: Lab

## 2012-12-20 ENCOUNTER — Inpatient Hospital Stay: Payer: 59

## 2012-12-20 ENCOUNTER — Encounter: Payer: Self-pay | Admitting: Oncology

## 2012-12-20 ENCOUNTER — Ambulatory Visit: Payer: 59 | Admitting: Oncology

## 2012-12-20 LAB — BLOOD GAS, ARTERIAL
Acid-Base Excess: 2.9 mmol/L — ABNORMAL HIGH (ref 0.0–2.0)
Bicarbonate: 26.1 mEq/L — ABNORMAL HIGH (ref 20.0–24.0)
Patient temperature: 98.6
TCO2: 22.9 mmol/L (ref 0–100)
pCO2 arterial: 36.6 mmHg (ref 35.0–45.0)
pH, Arterial: 7.467 — ABNORMAL HIGH (ref 7.350–7.450)
pO2, Arterial: 57.8 mmHg — ABNORMAL LOW (ref 80.0–100.0)

## 2012-12-20 LAB — PROTIME-INR
INR: 1.26 (ref 0.00–1.49)
Prothrombin Time: 15.5 seconds — ABNORMAL HIGH (ref 11.6–15.2)

## 2012-12-20 LAB — URINALYSIS, ROUTINE W REFLEX MICROSCOPIC
Ketones, ur: NEGATIVE mg/dL
Leukocytes, UA: NEGATIVE
Protein, ur: NEGATIVE mg/dL
Urobilinogen, UA: 1 mg/dL (ref 0.0–1.0)

## 2012-12-20 LAB — COMPREHENSIVE METABOLIC PANEL
AST: 18 U/L (ref 0–37)
Albumin: 1.9 g/dL — ABNORMAL LOW (ref 3.5–5.2)
Alkaline Phosphatase: 85 U/L (ref 39–117)
Chloride: 85 mEq/L — ABNORMAL LOW (ref 96–112)
Creatinine, Ser: 0.33 mg/dL — ABNORMAL LOW (ref 0.50–1.10)
Total Bilirubin: 0.2 mg/dL — ABNORMAL LOW (ref 0.3–1.2)

## 2012-12-20 LAB — URINE MICROSCOPIC-ADD ON

## 2012-12-20 LAB — CBC
Hemoglobin: 10.5 g/dL — ABNORMAL LOW (ref 12.0–15.0)
MCHC: 33.7 g/dL (ref 30.0–36.0)
MCV: 71.1 fL — ABNORMAL LOW (ref 78.0–100.0)
Platelets: 368 10*3/uL (ref 150–400)
RDW: 16.9 % — ABNORMAL HIGH (ref 11.5–15.5)
WBC: 36.3 10*3/uL — ABNORMAL HIGH (ref 4.0–10.5)

## 2012-12-20 LAB — GLUCOSE, CAPILLARY
Glucose-Capillary: 197 mg/dL — ABNORMAL HIGH (ref 70–99)
Glucose-Capillary: 271 mg/dL — ABNORMAL HIGH (ref 70–99)
Glucose-Capillary: 269 mg/dL — ABNORMAL HIGH (ref 70–99)

## 2012-12-20 LAB — TYPE AND SCREEN: ABO/RH(D): O POS

## 2012-12-20 LAB — APTT: aPTT: 30 seconds (ref 24–37)

## 2012-12-20 LAB — SURGICAL PCR SCREEN: MRSA, PCR: NEGATIVE

## 2012-12-20 LAB — BODY FLUID CULTURE

## 2012-12-20 MED ORDER — FENTANYL CITRATE 0.05 MG/ML IJ SOLN: 50.0000 ug | INTRAMUSCULAR | Status: DC

## 2012-12-20 MED ORDER — LACTATED RINGERS IV SOLN: INTRAVENOUS | Status: DC

## 2012-12-20 MED ORDER — CEFUROXIME SODIUM 1.5 G IJ SOLR
1.5000 g | INTRAMUSCULAR | Status: AC
  Administered 2012-12-21: 1.5 g via INTRAVENOUS
  Filled 2012-12-20: qty 1.5

## 2012-12-20 MED ORDER — FENTANYL CITRATE 0.05 MG/ML IJ SOLN: 50.0000 ug | INTRAMUSCULAR | Status: DC | PRN

## 2012-12-20 MED ORDER — LACTATED RINGERS IV SOLN
INTRAVENOUS | Status: AC
  Administered 2012-12-21: 08:00:00 via INTRAVENOUS

## 2012-12-20 NOTE — Progress Notes (Signed)
Inpatient Diabetes Program Recommendations  AACE/ADA: New Consensus Statement on Inpatient Glycemic Control (2013)  Target Ranges:  Prepandial:   less than 140 mg/dL      Peak postprandial:   less than 180 mg/dL (1-2 hours)      Critically ill patients:  140 - 180 mg/dL   Reason for Visit: Hyperglycemia  Results for SAROYA, RICCOBONO (MRN [id number]) as of 12/20/2012 12:40  Ref. Range 12/18/2012 17:36 12/18/2012 22:14 12/19/2012 07:27 12/19/2012 11:43 12/19/2012 18:08 12/19/2012 21:27 12/20/2012 07:22 12/20/2012 11:58  Glucose-Capillary Latest Range: 70-99 mg/dL 629 (H) 528 (H) 413 (H) 278 (H) 233 (H) 248 (H) 197 (H) 271 (H)    Inpatient Diabetes Program Recommendations Correction (SSI): Please consider ordering CBGs with Novolog sensitive correction ACHS while inpatient.  Note: On OHAs.  Thank you. Ailene Ards, RD, LDN, CDE Inpatient Diabetes Coordinator [phone]

## 2012-12-20 NOTE — Progress Notes (Signed)
Faxed fmla form to Brenda Jones @ [phone] °

## 2012-12-20 NOTE — Care Management Note (Signed)
   CARE MANAGEMENT NOTE 12/20/2012  Patient:  Anne [name], Anne [name]   Account Number:  [id number]  Date Initiated:  12/17/2012  Documentation initiated by:  [facility]  Subjective/Objective Assessment:   67 year old female with stage iv restal cancer and lung mets admitted with SOB, found to have pleural effusion.     Action/Plan:   From home.   Anticipated DC Date:  12/20/2012   Anticipated DC Plan:  HOME/SELF CARE      DC Planning Services  CM consult      Choice offered to / List presented to:             Status of service:  In process, will continue to follow Medicare Important Message given?  NA - LOS <3 / Initial given by admissions (If response is "NO", the following Medicare IM given date fields will be blank) Date Medicare IM given:   Date Additional Medicare IM given:    Discharge Disposition:    Per UR Regulation:  Reviewed for med. necessity/level of care/duration of stay  If discussed at Long Length of Stay Meetings, dates discussed:    Comments:  12/20/12 1355 Xolani Degracia,RN,MSN [phone] Continued stay appropriate per MD Advisor. Pt followed by [facility] netwrok for Link to [employer].

## 2012-12-20 NOTE — Progress Notes (Signed)
Had bad coughing spell about 1:45 this afternoon. Has been tired and sore since then  BP 140/72  Pulse 98  Temp(Src) 98.2 F (36.8 C) (Oral)  Resp 20  Ht 5' 4.5" (1.638 m)  Wt 150 lb 9.2 oz (68.3 kg)  BMI 25.46 kg/m2  SpO2 94%  CXR this AM showede persistent effusion with a new air fluid level  Discussed cytology with Dr. Luisa Hart- no malignant cells- all inflammatory  Diminished BS on right but a little better than yesterday  For Right VATS, drainage of pleural effusion, possible decortication in AM Discussed the risks with her. She understands risks include but are not limited to death, MI, DVT, PE, bleeding, possible need for transfusion, infection, air leaks, as well as unforeseeable complications  She wishes to proceed  We have arranged for [facility] to transport to [facility] in AM

## 2012-12-20 NOTE — Progress Notes (Signed)
Came to see Dr Ferd Glassing at bedside on behalf of [employer] [place] to Wellness program for [employer] employees with [employer]. Offered support and will continue to follow. Appreciative of visit.  Raiford Noble, MSN-Ed, RN,BSN- [facility] [phone]

## 2012-12-21 ENCOUNTER — Inpatient Hospital Stay (HOSPITAL_COMMUNITY): Payer: 59 | Admitting: Anesthesiology

## 2012-12-21 ENCOUNTER — Encounter (HOSPITAL_COMMUNITY): Payer: Self-pay | Admitting: Anesthesiology

## 2012-12-21 ENCOUNTER — Encounter (HOSPITAL_COMMUNITY): Payer: Self-pay | Admitting: Critical Care Medicine

## 2012-12-21 ENCOUNTER — Encounter (HOSPITAL_COMMUNITY)
Admission: AD | Disposition: A | Discharge status: Home / Self Care | Payer: Self-pay | Source: Ambulatory Visit | Attending: Thoracic Surgery (Cardiothoracic Vascular Surgery)

## 2012-12-21 ENCOUNTER — Inpatient Hospital Stay (HOSPITAL_COMMUNITY): Payer: 59

## 2012-12-21 DIAGNOSIS — J90 Pleural effusion, not elsewhere classified: Secondary | ICD-10-CM

## 2012-12-21 HISTORY — PX: DECORTICATION: SHX5101

## 2012-12-21 HISTORY — PX: VIDEO ASSISTED THORACOSCOPY: SHX5073

## 2012-12-21 HISTORY — PX: PLEURAL EFFUSION DRAINAGE: SHX5099

## 2012-12-21 LAB — POCT I-STAT 7, (LYTES, BLD GAS, ICA,H+H)
Acid-base deficit: 1 mmol/L (ref 0.0–2.0)
Bicarbonate: 24.2 mEq/L — ABNORMAL HIGH (ref 20.0–24.0)
Calcium, Ion: 1.15 mmol/L (ref 1.13–1.30)
HCT: 33 % — ABNORMAL LOW (ref 36.0–46.0)
Hemoglobin: 11.2 g/dL — ABNORMAL LOW (ref 12.0–15.0)
Patient temperature: 34.8
Sodium: 122 mEq/L — ABNORMAL LOW (ref 135–145)
pCO2 arterial: 38.3 mmHg (ref 35.0–45.0)
pH, Arterial: 7.398 (ref 7.350–7.450)
pO2, Arterial: 47 mmHg — ABNORMAL LOW (ref 80.0–100.0)

## 2012-12-21 LAB — GLUCOSE, CAPILLARY
Glucose-Capillary: 195 mg/dL — ABNORMAL HIGH (ref 70–99)
Glucose-Capillary: 246 mg/dL — ABNORMAL HIGH (ref 70–99)

## 2012-12-21 LAB — HEMOGLOBIN AND HEMATOCRIT, BLOOD
HCT: 31.7 % — ABNORMAL LOW (ref 36.0–46.0)
Hemoglobin: 10.5 g/dL — ABNORMAL LOW (ref 12.0–15.0)

## 2012-12-21 SURGERY — VIDEO ASSISTED THORACOSCOPY
Anesthesia: General | Site: Chest | Laterality: Right | Wound class: Dirty or Infected

## 2012-12-21 MED ORDER — ACETAMINOPHEN 160 MG/5ML PO SOLN: 1000.0000 mg | Freq: Four times a day (QID) | ORAL | Status: AC

## 2012-12-21 MED ORDER — DEXAMETHASONE SODIUM PHOSPHATE 4 MG/ML IJ SOLN
INTRAMUSCULAR | Status: DC | PRN
  Administered 2012-12-21: 4 mg via INTRAVENOUS

## 2012-12-21 MED ORDER — GERHARDT'S BUTT CREAM
TOPICAL_CREAM | Freq: Two times a day (BID) | CUTANEOUS | Status: DC
  Administered 2012-12-21 – 2012-12-26 (×8): via TOPICAL
  Filled 2012-12-21 (×2): qty 1

## 2012-12-21 MED ORDER — OXYCODONE HCL 5 MG PO TABS: 5.0000 mg | ORAL_TABLET | Freq: Once | ORAL | Status: DC | PRN

## 2012-12-21 MED ORDER — PIPERACILLIN-TAZOBACTAM 3.375 G IVPB
3.3750 g | Freq: Three times a day (TID) | INTRAVENOUS | Status: DC
  Administered 2012-12-21 – 2012-12-26 (×15): 3.375 g via INTRAVENOUS
  Filled 2012-12-21 (×16): qty 50

## 2012-12-21 MED ORDER — NEOSTIGMINE METHYLSULFATE 1 MG/ML IJ SOLN
INTRAMUSCULAR | Status: DC | PRN
  Administered 2012-12-21: 3 mg via INTRAVENOUS

## 2012-12-21 MED ORDER — ONDANSETRON HCL 4 MG/2ML IJ SOLN
INTRAMUSCULAR | Status: DC | PRN
  Administered 2012-12-21: 4 mg via INTRAVENOUS

## 2012-12-21 MED ORDER — DIPHENHYDRAMINE HCL 12.5 MG/5ML PO ELIX
12.5000 mg | ORAL_SOLUTION | Freq: Four times a day (QID) | ORAL | Status: DC | PRN
  Filled 2012-12-21: qty 5

## 2012-12-21 MED ORDER — ARTIFICIAL TEARS OP OINT
TOPICAL_OINTMENT | OPHTHALMIC | Status: DC | PRN
  Administered 2012-12-21: 1 via OPHTHALMIC

## 2012-12-21 MED ORDER — LIDOCAINE HCL (CARDIAC) 20 MG/ML IV SOLN
INTRAVENOUS | Status: DC | PRN
  Administered 2012-12-21: 40 mg via INTRAVENOUS

## 2012-12-21 MED ORDER — PROPOFOL 10 MG/ML IV BOLUS
INTRAVENOUS | Status: DC | PRN
  Administered 2012-12-21: 120 mg via INTRAVENOUS

## 2012-12-21 MED ORDER — SODIUM CHLORIDE 0.9 % IV SOLN
INTRAVENOUS | Status: DC
  Administered 2012-12-22: via INTRAVENOUS
  Filled 2012-12-21: qty 1

## 2012-12-21 MED ORDER — CEFUROXIME SODIUM 1.5 G IJ SOLR
INTRAMUSCULAR | Status: AC
  Filled 2012-12-21: qty 1.5

## 2012-12-21 MED ORDER — SODIUM CHLORIDE 0.9 % IJ SOLN: 9.0000 mL | INTRAMUSCULAR | Status: DC | PRN

## 2012-12-21 MED ORDER — PHENYLEPHRINE HCL 10 MG/ML IJ SOLN
10.0000 mg | INTRAVENOUS | Status: DC | PRN
  Administered 2012-12-21: 25 ug/min via INTRAVENOUS
  Administered 2012-12-21: 11:00:00 via INTRAVENOUS

## 2012-12-21 MED ORDER — VANCOMYCIN HCL IN DEXTROSE 750-5 MG/150ML-% IV SOLN
750.0000 mg | Freq: Two times a day (BID) | INTRAVENOUS | Status: DC
  Administered 2012-12-21 – 2012-12-24 (×7): 750 mg via INTRAVENOUS
  Filled 2012-12-21 (×8): qty 150

## 2012-12-21 MED ORDER — HYDROMORPHONE HCL PF 1 MG/ML IJ SOLN
0.2500 mg | INTRAMUSCULAR | Status: DC | PRN
  Administered 2012-12-21: 0.5 mg via INTRAVENOUS

## 2012-12-21 MED ORDER — VECURONIUM BROMIDE 10 MG IV SOLR
INTRAVENOUS | Status: DC | PRN
  Administered 2012-12-21: 1 mg via INTRAVENOUS
  Administered 2012-12-21: 2 mg via INTRAVENOUS
  Administered 2012-12-21 (×4): 1 mg via INTRAVENOUS

## 2012-12-21 MED ORDER — INSULIN ASPART 100 UNIT/ML ~~LOC~~ SOLN
0.0000 [IU] | SUBCUTANEOUS | Status: DC
  Administered 2012-12-21: 8 [IU] via SUBCUTANEOUS

## 2012-12-21 MED ORDER — 0.9 % SODIUM CHLORIDE (POUR BTL) OPTIME
TOPICAL | Status: DC | PRN
  Administered 2012-12-21 (×3): 1000 mL

## 2012-12-21 MED ORDER — ROCURONIUM BROMIDE 100 MG/10ML IV SOLN
INTRAVENOUS | Status: DC | PRN
  Administered 2012-12-21: 40 mg via INTRAVENOUS

## 2012-12-21 MED ORDER — FENTANYL CITRATE 0.05 MG/ML IJ SOLN
INTRAMUSCULAR | Status: DC | PRN
  Administered 2012-12-21: 50 ug via INTRAVENOUS
  Administered 2012-12-21: 100 ug via INTRAVENOUS
  Administered 2012-12-21 (×2): 50 ug via INTRAVENOUS

## 2012-12-21 MED ORDER — PROMETHAZINE HCL 25 MG/ML IJ SOLN: 6.2500 mg | INTRAMUSCULAR | Status: DC | PRN

## 2012-12-21 MED ORDER — ALBUMIN HUMAN 5 % IV SOLN
INTRAVENOUS | Status: AC
  Administered 2012-12-21: 250 mL via INTRAVENOUS
  Filled 2012-12-21: qty 250

## 2012-12-21 MED ORDER — ACETAMINOPHEN 500 MG PO TABS
1000.0000 mg | ORAL_TABLET | Freq: Four times a day (QID) | ORAL | Status: AC
  Administered 2012-12-21 – 2012-12-22 (×4): 1000 mg via ORAL
  Filled 2012-12-21 (×5): qty 2

## 2012-12-21 MED ORDER — POLYVINYL ALCOHOL 1.4 % OP SOLN
2.0000 [drp] | OPHTHALMIC | Status: DC | PRN
  Administered 2012-12-21: 2 [drp] via OPHTHALMIC
  Filled 2012-12-21: qty 15

## 2012-12-21 MED ORDER — DIPHENHYDRAMINE HCL 50 MG/ML IJ SOLN: 12.5000 mg | Freq: Four times a day (QID) | INTRAMUSCULAR | Status: DC | PRN

## 2012-12-21 MED ORDER — HYDROMORPHONE HCL PF 1 MG/ML IJ SOLN
INTRAMUSCULAR | Status: AC
  Administered 2012-12-21: 0.5 mg via INTRAVENOUS
  Filled 2012-12-21: qty 1

## 2012-12-21 MED ORDER — PHENYLEPHRINE HCL 10 MG/ML IJ SOLN
INTRAMUSCULAR | Status: DC | PRN
  Administered 2012-12-21 (×2): 80 ug via INTRAVENOUS

## 2012-12-21 MED ORDER — DEXTROSE-NACL 5-0.9 % IV SOLN
INTRAVENOUS | Status: DC
  Administered 2012-12-21: 22:00:00 via INTRAVENOUS

## 2012-12-21 MED ORDER — OXYCODONE-ACETAMINOPHEN 5-325 MG PO TABS
1.0000 | ORAL_TABLET | ORAL | Status: DC | PRN
  Filled 2012-12-21 (×3): qty 2

## 2012-12-21 MED ORDER — SODIUM CHLORIDE 0.45 % IV SOLN
INTRAVENOUS | Status: DC
  Administered 2012-12-22 (×2): via INTRAVENOUS

## 2012-12-21 MED ORDER — FENTANYL 10 MCG/ML IV SOLN
INTRAVENOUS | Status: DC
  Administered 2012-12-21: 110 ug via INTRAVENOUS
  Administered 2012-12-21 – 2012-12-22 (×2): 50 ug via INTRAVENOUS
  Administered 2012-12-22: 110 ug via INTRAVENOUS
  Administered 2012-12-22: 40 ug via INTRAVENOUS
  Administered 2012-12-22 (×2): 70 ug via INTRAVENOUS
  Administered 2012-12-22: 18:00:00 via INTRAVENOUS
  Administered 2012-12-22: 50 ug via INTRAVENOUS
  Administered 2012-12-23: 60 ug via INTRAVENOUS
  Administered 2012-12-23: 50 ug via INTRAVENOUS
  Administered 2012-12-23: 60 ug via INTRAVENOUS
  Administered 2012-12-23: 40 ug via INTRAVENOUS
  Administered 2012-12-23: 30 ug via INTRAVENOUS
  Administered 2012-12-23: 50 ug via INTRAVENOUS
  Administered 2012-12-24: 30 ug via INTRAVENOUS
  Administered 2012-12-24: 90 ug via INTRAVENOUS
  Administered 2012-12-24: 70 ug via INTRAVENOUS
  Administered 2012-12-24: 30 ug via INTRAVENOUS
  Administered 2012-12-24: 70 ug via INTRAVENOUS
  Administered 2012-12-24: 90 ug via INTRAVENOUS
  Administered 2012-12-25: 30 ug via INTRAVENOUS
  Administered 2012-12-25: 80 ug via INTRAVENOUS
  Administered 2012-12-25: 20 ug via INTRAVENOUS
  Administered 2012-12-25: 17:00:00 via INTRAVENOUS
  Administered 2012-12-25: 70 ug via INTRAVENOUS
  Administered 2012-12-25: 20 ug via INTRAVENOUS
  Administered 2012-12-25: 80 ug via INTRAVENOUS
  Administered 2012-12-26: 40 ug via INTRAVENOUS
  Administered 2012-12-26: 130 ug via INTRAVENOUS
  Administered 2012-12-26: 30 ug via INTRAVENOUS
  Administered 2012-12-26: 10 ug via INTRAVENOUS
  Filled 2012-12-21 (×9): qty 50

## 2012-12-21 MED ORDER — SODIUM CHLORIDE 0.9 % IV SOLN
INTRAVENOUS | Status: DC | PRN
  Administered 2012-12-21: 11:00:00 via INTRAVENOUS

## 2012-12-21 MED ORDER — GLYCOPYRROLATE 0.2 MG/ML IJ SOLN
INTRAMUSCULAR | Status: DC | PRN
  Administered 2012-12-21: 0.4 mg via INTRAVENOUS

## 2012-12-21 MED ORDER — ONDANSETRON HCL 4 MG/2ML IJ SOLN: 4.0000 mg | Freq: Four times a day (QID) | INTRAMUSCULAR | Status: DC | PRN

## 2012-12-21 MED ORDER — LACTATED RINGERS IV SOLN
INTRAVENOUS | Status: DC | PRN
  Administered 2012-12-21: 08:00:00 via INTRAVENOUS

## 2012-12-21 MED ORDER — NALOXONE HCL 0.4 MG/ML IJ SOLN: 0.4000 mg | INTRAMUSCULAR | Status: DC | PRN

## 2012-12-21 MED ORDER — OXYCODONE-ACETAMINOPHEN 5-325 MG PO TABS
1.0000 | ORAL_TABLET | ORAL | Status: DC | PRN
  Administered 2012-12-22 – 2012-12-27 (×18): 2 via ORAL
  Filled 2012-12-21 (×15): qty 2

## 2012-12-21 MED ORDER — BISACODYL 5 MG PO TBEC
10.0000 mg | DELAYED_RELEASE_TABLET | Freq: Every day | ORAL | Status: DC
  Administered 2012-12-22 – 2012-12-26 (×5): 10 mg via ORAL
  Filled 2012-12-21 (×5): qty 2

## 2012-12-21 MED ORDER — POTASSIUM CHLORIDE 10 MEQ/50ML IV SOLN: 10.0000 meq | Freq: Every day | INTRAVENOUS | Status: DC | PRN

## 2012-12-21 MED ORDER — SENNOSIDES-DOCUSATE SODIUM 8.6-50 MG PO TABS
1.0000 | ORAL_TABLET | Freq: Every evening | ORAL | Status: DC | PRN
  Administered 2012-12-22 – 2012-12-24 (×2): 1 via ORAL
  Filled 2012-12-21 (×2): qty 1

## 2012-12-21 MED ORDER — TRAMADOL HCL 50 MG PO TABS
50.0000 mg | ORAL_TABLET | Freq: Four times a day (QID) | ORAL | Status: DC | PRN
  Administered 2012-12-23: 50 mg via ORAL
  Administered 2012-12-25 – 2012-12-27 (×2): 100 mg via ORAL
  Filled 2012-12-21: qty 1
  Filled 2012-12-21 (×2): qty 2

## 2012-12-21 MED ORDER — OXYCODONE HCL 5 MG PO TABS
5.0000 mg | ORAL_TABLET | ORAL | Status: AC | PRN
  Administered 2012-12-21 – 2012-12-22 (×4): 10 mg via ORAL
  Filled 2012-12-21 (×4): qty 2

## 2012-12-21 MED ORDER — OXYCODONE HCL 5 MG/5ML PO SOLN: 5.0000 mg | Freq: Once | ORAL | Status: DC | PRN

## 2012-12-21 MED ORDER — TALC 5 G PL SUSR
5.0000 g | Freq: Once | INTRAPLEURAL | Status: DC
  Filled 2012-12-21: qty 5

## 2012-12-21 MED ORDER — DEXTROSE 5 % IV SOLN
INTRAVENOUS | Status: AC
  Filled 2012-12-21: qty 50

## 2012-12-21 SURGICAL SUPPLY — 88 items
ADH SKN CLS APL DERMABOND .7 (GAUZE/BANDAGES/DRESSINGS) ×1
APPLIER CLIP ROT 10 11.4 M/L (STAPLE)
APR CLP MED LRG 11.4X10 (STAPLE)
BAG SPEC RTRVL LRG 6X4 10 (ENDOMECHANICALS)
CANISTER SUCTION 2500CC (MISCELLANEOUS) ×2 IMPLANT
CATH KIT ON Q 5IN SLV (PAIN MANAGEMENT) IMPLANT
CATH THORACIC 28FR (CATHETERS) IMPLANT
CATH THORACIC 28FR RT ANG (CATHETERS) IMPLANT
CATH THORACIC 36FR (CATHETERS) IMPLANT
CATH THORACIC 36FR RT ANG (CATHETERS) IMPLANT
CLIP APPLIE ROT 10 11.4 M/L (STAPLE) IMPLANT
CLIP TI MEDIUM 6 (CLIP) IMPLANT
CONN ST 1/4X3/8  BEN (MISCELLANEOUS) ×2
CONN ST 1/4X3/8 BEN (MISCELLANEOUS) IMPLANT
CONN Y 3/8X3/8X3/8  BEN (MISCELLANEOUS) ×1
CONN Y 3/8X3/8X3/8 BEN (MISCELLANEOUS) ×1 IMPLANT
CONT SPEC 4OZ CLIKSEAL STRL BL (MISCELLANEOUS) ×4 IMPLANT
DERMABOND ADVANCED (GAUZE/BANDAGES/DRESSINGS) ×1
DERMABOND ADVANCED .7 DNX12 (GAUZE/BANDAGES/DRESSINGS) IMPLANT
DRAIN CHANNEL 32F RND 10.7 FF (WOUND CARE) ×2 IMPLANT
DRAPE LAPAROSCOPIC ABDOMINAL (DRAPES) ×2 IMPLANT
DRAPE WARM FLUID 44X44 (DRAPE) ×2 IMPLANT
ELECT REM PT RETURN 9FT ADLT (ELECTROSURGICAL) ×2
ELECTRODE REM PT RTRN 9FT ADLT (ELECTROSURGICAL) ×1 IMPLANT
GAUZE SPONGE 4X4 16PLY XRAY LF (GAUZE/BANDAGES/DRESSINGS) ×1 IMPLANT
GLOVE BIO SURGEON STRL SZ7 (GLOVE) ×2 IMPLANT
GLOVE BIO SURGEON STRL SZ7.5 (GLOVE) ×1 IMPLANT
GLOVE BIOGEL PI IND STRL 6.5 (GLOVE) IMPLANT
GLOVE BIOGEL PI INDICATOR 6.5 (GLOVE) ×1
GLOVE EUDERMIC 7 POWDERFREE (GLOVE) ×1 IMPLANT
GLOVE SURG SIGNA 7.5 PF LTX (GLOVE) ×4 IMPLANT
GLOVE SURG SS PI 7.0 STRL IVOR (GLOVE) ×1 IMPLANT
GOWN PREVENTION PLUS XLARGE (GOWN DISPOSABLE) ×3 IMPLANT
GOWN STRL NON-REIN LRG LVL3 (GOWN DISPOSABLE) ×4 IMPLANT
GOWN STRL REIN XL XLG (GOWN DISPOSABLE) ×1 IMPLANT
HANDLE STAPLE ENDO GIA SHORT (STAPLE) ×1
HEMOSTAT SURGICEL 2X14 (HEMOSTASIS) IMPLANT
KIT BASIN OR (CUSTOM PROCEDURE TRAY) ×2 IMPLANT
KIT ROOM TURNOVER OR (KITS) ×2 IMPLANT
KIT SUCTION CATH 14FR (SUCTIONS) ×2 IMPLANT
NS IRRIG 1000ML POUR BTL (IV SOLUTION) ×4 IMPLANT
PACK CHEST (CUSTOM PROCEDURE TRAY) ×2 IMPLANT
PAD ARMBOARD 7.5X6 YLW CONV (MISCELLANEOUS) ×4 IMPLANT
PENCIL BUTTON HOLSTER BLD 10FT (ELECTRODE) ×1 IMPLANT
POUCH ENDO CATCH II 15MM (MISCELLANEOUS) IMPLANT
POUCH SPECIMEN RETRIEVAL 10MM (ENDOMECHANICALS) IMPLANT
POUCH SPECIMEN TISS 5X8 750ML (MISCELLANEOUS) ×1 IMPLANT
RELOAD EGIA 60 MED/THCK PURPLE (STAPLE) ×2 IMPLANT
RELOAD STAPLE 60 MED/THCK ART (STAPLE) IMPLANT
SEALANT PROGEL (MISCELLANEOUS) ×1 IMPLANT
SEALANT SURG COSEAL 4ML (VASCULAR PRODUCTS) IMPLANT
SEALANT SURG COSEAL 8ML (VASCULAR PRODUCTS) IMPLANT
SOLUTION ANTI FOG 6CC (MISCELLANEOUS) ×2 IMPLANT
SPECIMEN JAR MEDIUM (MISCELLANEOUS) ×2 IMPLANT
SPONGE GAUZE 4X4 12PLY (GAUZE/BANDAGES/DRESSINGS) ×2 IMPLANT
SPONGE INTESTINAL PEANUT (DISPOSABLE) ×1 IMPLANT
STAPLER ENDO GIA 12 SHRT THIN (STAPLE) IMPLANT
STAPLER ENDO GIA 12MM SHORT (STAPLE) ×1 IMPLANT
SUT PROLENE 4 0 RB 1 (SUTURE)
SUT PROLENE 4-0 RB1 .5 CRCL 36 (SUTURE) IMPLANT
SUT SILK  1 MH (SUTURE) ×3
SUT SILK 1 MH (SUTURE) ×2 IMPLANT
SUT SILK 2 0SH CR/8 30 (SUTURE) IMPLANT
SUT SILK 3 0SH CR/8 30 (SUTURE) IMPLANT
SUT VIC AB 1 CTX 36 (SUTURE)
SUT VIC AB 1 CTX36XBRD ANBCTR (SUTURE) IMPLANT
SUT VIC AB 2-0 CT1 27 (SUTURE) ×2
SUT VIC AB 2-0 CT1 TAPERPNT 27 (SUTURE) IMPLANT
SUT VIC AB 2-0 CTX 36 (SUTURE) ×1 IMPLANT
SUT VIC AB 2-0 UR6 27 (SUTURE) ×1 IMPLANT
SUT VIC AB 3-0 MH 27 (SUTURE) IMPLANT
SUT VIC AB 3-0 SH 27 (SUTURE) ×2
SUT VIC AB 3-0 SH 27X BRD (SUTURE) IMPLANT
SUT VIC AB 3-0 X1 27 (SUTURE) ×2 IMPLANT
SUT VICRYL 0 UR6 27IN ABS (SUTURE) ×1 IMPLANT
SUT VICRYL 2 TP 1 (SUTURE) IMPLANT
SWAB COLLECTION DEVICE MRSA (MISCELLANEOUS) IMPLANT
SYSTEM SAHARA CHEST DRAIN ATS (WOUND CARE) ×2 IMPLANT
TAPE CLOTH 4X10 WHT NS (GAUZE/BANDAGES/DRESSINGS) ×2 IMPLANT
TAPE CLOTH SURG 4X10 WHT LF (GAUZE/BANDAGES/DRESSINGS) ×1 IMPLANT
TIP APPLICATOR SPRAY EXTEND 16 (VASCULAR PRODUCTS) ×1 IMPLANT
TOWEL OR 17X24 6PK STRL BLUE (TOWEL DISPOSABLE) ×2 IMPLANT
TOWEL OR 17X26 10 PK STRL BLUE (TOWEL DISPOSABLE) ×4 IMPLANT
TRAP SPECIMEN MUCOUS 40CC (MISCELLANEOUS) ×2 IMPLANT
TRAY FOLEY CATH 14FRSI W/METER (CATHETERS) ×2 IMPLANT
TUBE ANAEROBIC SPECIMEN COL (MISCELLANEOUS) IMPLANT
TUNNELER SHEATH ON-Q 11GX8 DSP (PAIN MANAGEMENT) IMPLANT
WATER STERILE IRR 1000ML POUR (IV SOLUTION) ×4 IMPLANT

## 2012-12-21 NOTE — Anesthesia Postprocedure Evaluation (Signed)
  Anesthesia Post-op Note  Patient: Anne [name]  Procedure(s) Performed: Procedure(s): VIDEO ASSISTED THORACOSCOPY (Right) DRAINAGE OF PLEURAL EFFUSION (Right) DECORTICATION (Right)  Patient Location: PACU  Anesthesia Type:General  Level of Consciousness: awake and alert   Airway and Oxygen Therapy: Patient Spontanous Breathing  Post-op Pain: mild  Post-op Assessment: Post-op Vital signs reviewed and Patient's Cardiovascular Status Stable  Post-op Vital Signs: stable  Complications: No apparent anesthesia complications

## 2012-12-21 NOTE — Progress Notes (Signed)
Patient ID: NEENAH CANTER, female   DOB: 1946/03/16, 67 y.o.   MRN: [id number] EVENING ROUNDS NOTE :     [address]       [place] [address]             [phone]                 Day of Surgery Procedure(s) (LRB): VIDEO ASSISTED THORACOSCOPY (Right) DRAINAGE OF PLEURAL EFFUSION (Right) DECORTICATION (Right)  Total Length of Stay:  LOS: 5 days  BP 119/54  Pulse 91  Temp(Src) 97.5 F (36.4 C) (Oral)  Resp 21  Ht 5' 4.5" (1.638 m)  Wt 148 lb 1.6 oz (67.178 kg)  BMI 25.04 kg/m2  SpO2 91%  .Intake/Output     09/23 0701 - 09/24 0700 09/24 0701 - 09/25 0700   P.O. 1800    I.V. (mL/kg)  1900 (28.3)   Total Intake(mL/kg) 1800 (26.8) 1900 (28.3)   Urine (mL/kg/hr) 300 (0.2) 750 (1.1)   Blood  150 (0.2)   Chest Tube  225 (0.3)   Total Output 300 1125   Net +1500 +775        Urine Occurrence 6 x    Stool Occurrence 4 x      . dextrose 5 % and 0.9% NaCl 125 mL/hr at 12/21/12 1600     Lab Results  Component Value Date   WBC 36.3* 12/20/2012   HGB 10.5* 12/21/2012   HCT 31.7* 12/21/2012   PLT 368 12/20/2012   GLUCOSE 292* 12/20/2012   CHOL 111 10/17/2012   TRIG 109.0 10/17/2012   HDL 35.60* 10/17/2012   LDLCALC 54 10/17/2012   ALT 13 12/20/2012   AST 18 12/20/2012   NA 122* 12/21/2012   K 3.9 12/21/2012   CL 85* 12/20/2012   CREATININE 0.33* 12/20/2012   BUN 6 12/20/2012   CO2 26 12/20/2012   TSH 1.81 10/17/2012   INR 1.26 12/20/2012   HGBA1C 7.5* 10/17/2012   MICROALBUR 2.2* 10/17/2012   Stable post op using pca for pain   Delight Ovens MD  Beeper [phone] Office [phone] 12/21/2012 5:05 PM

## 2012-12-21 NOTE — Progress Notes (Signed)
Pt had CHG bath this a.m.Report given to [facility]. Pt was picked up by [facility] at 0625 a.m.

## 2012-12-21 NOTE — H&P (View-Only) (Signed)
Reason for Consult: Right pleural effusion Referring Physician: Dr. Khan  Anne [name] is an 67 y.o. female.  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 I have seen her before regarding her lung metastasis. She had undergone 2 cycles of chemotherapy. Over the weekend she developed right sided pleuritic CP. Initially this was anterior but after a severe coughing spell it moved to her right side and back. She had a CT done on 9/16 which showed the right lung met had increased in size. There was a new small right effusion. Today she became more SOB and a repeat CXR showed a large pleural effusion. A repeat CT showed the rigth chest was essentially completely filled with a loculated pleural effusion. There was extensive compressive atelectasis.  Past Medical History  Diagnosis Date  . Heart murmur, systolic 07/01/2011  . Postmenopausal HRT (hormone replacement therapy) 07/01/2011    surgical menopause  . Hx of hyperglycemia 07/01/2011  . Hx of compression fracture of spine 1985    t12 after a  20 foot fall  . Hx of fracture of wrist     after fall   . 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 Hx of colonic polyps   . Early cataract     stoneburner   . High frequency hearing loss     [facility] followed   . Diabetes mellitus without complication   . Cancer     colon  . Skin cancer   . Complication of anesthesia     pt hadpanic attacks with versed, no versed    Past Surgical History  Procedure Laterality Date  . Tonsillectomy  1952  . Adenoidectomy  1952  . Wrist surgery  1957    left reduction  . Compression fraction  1985    T12  . Anterior cruciate ligament repair  1993    right Murphy  . Cosmetic surgery  1999-2007    holderness blepharoplasty facial rhytidectomy mastopexy abdominoplasty   . Dental implant    . Dilation and curettage of uterus  1977  . Total abdominal hysterectomy  2005    with cystocele repair   . Colonoscopy N/A 10/24/2012    Procedure: COLONOSCOPY;   Surgeon: John N Perry, MD;  Location: [facility];  Service: Endoscopy;  Laterality: N/A;  . Colonoscopy N/A 10/25/2012    Procedure: COLONOSCOPY;  Surgeon: John N Perry, MD;  Location: [facility];  Service: Endoscopy;  Laterality: N/A;  . Video bronchoscopy Bilateral 10/27/2012    Procedure: VIDEO BRONCHOSCOPY WITH FLUORO;  Surgeon: Vineet Sood, MD;  Location: [facility];  Service: Endoscopy;  Laterality: Bilateral;  . Portacath placement Left 11/21/2012    Procedure: INSERTION PORT-A-CATH;  Surgeon: Rayshard Schirtzinger C Deveion Denz, MD;  Location: [facility] OR;  Service: Thoracic;  Laterality: Left;    Family History  Problem Relation Age of Onset  . Diabetes Father   . Cancer Father     oral  . Thyroid disease Father   . Heart attack Father     died age 83  . Dementia Mother   . Hypertension Mother   . Hyperlipidemia Mother   . Cancer Brother     retro lymphosarcoma agent orange  . Cancer Sister     clear cell ovarina and breast insitu  . Obesity      siblings   . Diabetes Brother   . Alcohol abuse Brother     hx of  . Other        arrythmia   . Hypertension Sister   . Asthma Sister     Social History:  reports that she has never smoked. She has never used smokeless tobacco. She reports that she drinks about 1.0 ounces of alcohol per week. She reports that she does not use illicit drugs.  Allergies:  Allergies  Allergen Reactions  . Ativan [Lorazepam]     Causes panic attacks  . Midazolam Other (See Comments)    Caused very crazy feelings, terrors, panic attacks, anxiety  . Zolpidem Tartrate Other (See Comments)    Caused very crazy feelings, terrors, shaking, panic attacks, anxiety  . Neomycin-Bacitracin Zn-Polymyx Itching and Rash    Medications:  Prior to Admission:  Prescriptions prior to admission  Medication Sig Dispense Refill  . Calcium Carbonate Antacid (TUMS E-X PO) Take 2 tablets by mouth daily.      . Calcium Carbonate-Vitamin D (CALTRATE 600+D PO) Take 1 tablet by  mouth daily.       . estradiol (MINIVELLE) 0.0375 MG/24HR Place 1 patch onto the skin 2 (two) times a week. Sundays and Thursdays      . metFORMIN (GLUCOPHAGE) 1000 MG tablet Take 1,000 mg by mouth 2 (two) times daily with a meal.      . METHYLTESTOSTERONE PO Take 1.25 mg by mouth daily.      . Multiple Vitamin (MULTIVITAMIN) tablet Take 1 tablet by mouth daily. Senior      . ondansetron (ZOFRAN ODT) 8 MG disintegrating tablet Take 1 tablet (8 mg total) by mouth every 8 (eight) hours as needed for nausea.  30 tablet  2  . oxyCODONE (ROXICODONE) 5 MG immediate release tablet Take 1-2 tablets (5-10 mg total) by mouth 4 (four) times daily as needed for pain.  120 tablet  0  . polyethylene glycol (MIRALAX / GLYCOLAX) packet Take 17 g by mouth daily.  100 each  0  . Probiotic Product (SOLUBLE FIBER/PROBIOTICS PO) Take 1 tablet by mouth daily.      . sitaGLIPtin (JANUVIA) 100 MG tablet Take 100 mg by mouth daily.      . dexamethasone (DECADRON) 4 MG tablet Take 2 tablets (8 mg total) by mouth 2 (two) times daily with a meal. Take daily starting the day after chemotherapy for 2 days. Take with food.  30 tablet  1  . lidocaine-prilocaine (EMLA) cream Apply topically as needed.  30 g  5  . megestrol (MEGACE ES) 625 MG/5ML suspension Take 5 mLs (625 mg total) by mouth daily.  150 mL  7    Results for orders placed during the hospital encounter of 12/16/12 (from the past 48 hour(s))  CBC     Status: Abnormal   Collection Time    12/16/12  1:21 PM      Result Value Range   WBC 10.2  4.0 - 10.5 K/uL   RBC 4.18  3.87 - 5.11 MIL/uL   Hemoglobin 9.9 (*) 12.0 - 15.0 g/dL   HCT 30.1 (*) 36.0 - 46.0 %   MCV 72.0 (*) 78.0 - 100.0 fL   MCH 23.7 (*) 26.0 - 34.0 pg   MCHC 32.9  30.0 - 36.0 g/dL   RDW 15.3  11.5 - 15.5 %   Platelets 396  150 - 400 K/uL  CREATININE, SERUM     Status: Abnormal   Collection Time    12/16/12  1:21 PM      Result Value Range   Creatinine, Ser 0.39 (*) 0.50 - 1.10 mg/dL     GFR  calc non Af Amer >90  >90 mL/min   GFR calc Af Amer >90  >90 mL/min   Comment: (NOTE)     The eGFR has been calculated using the CKD EPI equation.     This calculation has not been validated in all clinical situations.     eGFR's persistently <90 mL/min signify possible Chronic Kidney     Disease.    X-ray Chest Pa And Lateral   12/16/2012   CLINICAL DATA:  Patient with pleural effusion.  EXAM: CHEST  2 VIEW  COMPARISON:  12/13/2012  FINDINGS: There is a left chest wall port a catheter. The tip is in the projection of the SVC. The heart size appears normal. There has been significant interval increase in volume of the right pleural effusion with associated diminished aeration to the right midlung and right upper lobe. The left lung appears clear.  IMPRESSION: 1. Significant interval increase in volume of the right pleural effusion.   Electronically Signed   By: Taylor  Stroud M.D.   On: 12/16/2012 13:41   Ct Chest W Contrast  12/16/2012   CLINICAL DATA:  Stage IV rectal carcinoma. Enlarging lung mass.  EXAM: CT CHEST, ABDOMEN, AND PELVIS WITH CONTRAST  TECHNIQUE: Multidetector CT imaging of the chest, abdomen and pelvis was performed following the standard protocol during bolus administration of intravenous contrast.  CONTRAST:  100mL OMNIPAQUE IOHEXOL 300 MG/ML  SOLN  COMPARISON:  Chest CT 12/13/2012, PET-CT scan 11/02/2012  FINDINGS: CT CHEST FINDINGS  There is a port in the anterior chest wall. No axillary or supraclavicular lymphadenopathy.  There is interval increase in loculated pleural fluid within the right hemi thorax. The lower portion of the hemi thorax is near completely obliterated by the pleural fluid with atelectasis of the right lower lobe and right middle lobe. The upper lobe is also occupied by loculated fluid collection withatelectasis of the right upper lobe additionally. These findings have increased significantly from comparison CT of 12/13/2012. The right lower lobe pulmonary mass  is difficult to define on the background of the large loculated fluid collections.  No mediastinal lymphadenopathy. No pericardial fluid. Small left small effusion which is non loculated.  CT ABDOMEN AND PELVIS FINDINGS  No focal hepatic lesion. The gallbladder, pancreas, spleen, adrenal glands, and kidneys are normal. The stomach, small bowel and cecum are normal. Moderate volume of stool throughout the colon. Abdominal aorta is normal in caliber. No retroperitoneal or periportal lymphadenopathy.  The bladder is normal no pelvic lymphadenopathy. Review of bone windows demonstrates no aggressive osseous lesions. .  IMPRESSION: CT CHEST IMPRESSION  1. Progressively expanding loculated pleural fluid collections in the right hemi thorax with near complete filling of the lower portion of the hemothorax and significant compromise of the upper portion of the hemithorax.  2. Right lower lobe pulmonary mass is difficult to define on the background of expanding loculated fluid collections.  CT ABDOMEN AND PELVIS IMPRESSION  1. Moderate volume of stool throughout the colon suggest constipation or potentially distal obstruction from the rectal mass.  This was made a call report.   Electronically Signed   By: Stewart  Edmunds M.D.   On: 12/16/2012 16:51   Ct Abdomen Pelvis W Contrast  12/16/2012   CLINICAL DATA:  Stage IV rectal carcinoma. Enlarging lung mass.  EXAM: CT CHEST, ABDOMEN, AND PELVIS WITH CONTRAST  TECHNIQUE: Multidetector CT imaging of the chest, abdomen and pelvis was performed following the standard protocol during bolus administration of intravenous contrast.    CONTRAST:  100mL OMNIPAQUE IOHEXOL 300 MG/ML  SOLN  COMPARISON:  Chest CT 12/13/2012, PET-CT scan 11/02/2012  FINDINGS: CT CHEST FINDINGS  There is a port in the anterior chest wall. No axillary or supraclavicular lymphadenopathy.  There is interval increase in loculated pleural fluid within the right hemi thorax. The lower portion of the hemi thorax  is near completely obliterated by the pleural fluid with atelectasis of the right lower lobe and right middle lobe. The upper lobe is also occupied by loculated fluid collection withatelectasis of the right upper lobe additionally. These findings have increased significantly from comparison CT of 12/13/2012. The right lower lobe pulmonary mass is difficult to define on the background of the large loculated fluid collections.  No mediastinal lymphadenopathy. No pericardial fluid. Small left small effusion which is non loculated.  CT ABDOMEN AND PELVIS FINDINGS  No focal hepatic lesion. The gallbladder, pancreas, spleen, adrenal glands, and kidneys are normal. The stomach, small bowel and cecum are normal. Moderate volume of stool throughout the colon. Abdominal aorta is normal in caliber. No retroperitoneal or periportal lymphadenopathy.  The bladder is normal no pelvic lymphadenopathy. Review of bone windows demonstrates no aggressive osseous lesions. .  IMPRESSION: CT CHEST IMPRESSION  1. Progressively expanding loculated pleural fluid collections in the right hemi thorax with near complete filling of the lower portion of the hemothorax and significant compromise of the upper portion of the hemithorax.  2. Right lower lobe pulmonary mass is difficult to define on the background of expanding loculated fluid collections.  CT ABDOMEN AND PELVIS IMPRESSION  1. Moderate volume of stool throughout the colon suggest constipation or potentially distal obstruction from the rectal mass.  This was made a call report.   Electronically Signed   By: Stewart  Edmunds M.D.   On: 12/16/2012 16:51    Review of Systems  Constitutional: Negative for fever and chills.  Respiratory: Positive for cough, sputum production (clear), shortness of breath and wheezing.   Cardiovascular: Positive for chest pain.  Gastrointestinal: Positive for constipation (still having thin stools). Negative for blood in stool.   Blood pressure  127/69, pulse 97, temperature 98.3 F (36.8 C), temperature source Oral, resp. rate 19, height 5' 4.5" (1.638 m), weight 135 lb 12.9 oz (61.6 kg), SpO2 96.00%. Physical Exam  Constitutional: She is oriented to person, place, and time. She appears well-developed and well-nourished. No distress.  HENT:  Head: Normocephalic and atraumatic.  Eyes: Pupils are equal, round, and reactive to light.  Neck: Neck supple. No tracheal deviation present.  Cardiovascular: Normal rate, regular rhythm and normal heart sounds.   Respiratory:  Absent BS right side, left clear  GI: Soft. There is no tenderness.  Musculoskeletal: She exhibits no edema.  Neurological: She is alert and oriented to person, place, and time.  Skin: Skin is warm and dry.    Assessment/Plan: 67 yo woman with stage IV rectal cancer with a right lung met(biopsy proven). She presents with a new rapidly enlarging right pleural effusion. The obvious concern is for a malignant effusion, although the rate of accumulation is pretty rapid for that.  I recommended to her that we proceed with right thoracentesis at bedside to drain some of the effusion. It will provide some symptomatic relief and allow us to run tests to determine the etiology of the fluid. The effusion is too large to drain all of the fluid at one setting.  I discussed the risks and benefits of the procedure with Dr. Bai. She understands   the risks of bleeding and pneumothorax.  Tera Pellicane C 12/16/2012, 5:57 PM    Addendum  1.5 of straw colored slightly murky fluid was withdrawn. She tolerated it well. Post procedure CXR showed improved aeration of the right lung although there is still a significant right effusion. Fluid sent for cytology, cell count with diff, culture, protein, glucose, LDH, and triglycerides(? Chylothorax)  

## 2012-12-21 NOTE — Transfer of Care (Signed)
Immediate Anesthesia Transfer of Care Note  Patient: Anne [name]  Procedure(s) Performed: Procedure(s): VIDEO ASSISTED THORACOSCOPY (Right) DRAINAGE OF PLEURAL EFFUSION (Right) DECORTICATION (Right)  Patient Location: PACU  Anesthesia Type:General  Level of Consciousness: awake, alert  and oriented  Airway & Oxygen Therapy: Patient Spontanous Breathing and Patient connected to face mask oxygen  Post-op Assessment: Report given to PACU RN, Post -op Vital signs reviewed and stable and Patient moving all extremities X 4  Post vital signs: Reviewed and stable  Complications: No apparent anesthesia complications

## 2012-12-21 NOTE — Anesthesia Preprocedure Evaluation (Addendum)
Anesthesia Evaluation  Patient identified by MRN, date of birth, ID band Patient awake    Reviewed: Allergy & Precautions, H&P , NPO status , Patient's Chart, lab work & pertinent test results  Airway Mallampati: I TM Distance: >3 FB Neck ROM: full    Dental  (+) Teeth Intact, Dental Advidsory Given and Implants   Pulmonary shortness of breath, pneumonia -,  Pleural adhesions   + decreased breath sounds      Cardiovascular Rhythm:Regular Rate:Normal     Neuro/Psych    GI/Hepatic H/o colon CA   Endo/Other  diabetes, Type 2  Renal/GU      Musculoskeletal   Abdominal   Peds  Hematology  (+) anemia ,   Anesthesia Other Findings Benzodiazepines cause severe anxiety.  Reproductive/Obstetrics                          Anesthesia Physical Anesthesia Plan  ASA: II  Anesthesia Plan: General   Post-op Pain Management:    Induction: Intravenous  Airway Management Planned: Double Lumen EBT  Additional Equipment: Ultrasound Guidance Line Placement, CVP and Arterial line  Intra-op Plan:   Post-operative Plan: Extubation in OR  Informed Consent: I have reviewed the patients History and Physical, chart, labs and discussed the procedure including the risks, benefits and alternatives for the proposed anesthesia with the patient or authorized representative who has indicated his/her understanding and acceptance.   Dental advisory given  Plan Discussed with:   Anesthesia Plan Comments:         Anesthesia Quick Evaluation

## 2012-12-21 NOTE — Brief Op Note (Addendum)
      [address]       [place] [address]             [phone]     12/16/2012 - 12/21/2012  12:44 PM  PATIENT:  Anne [name]  67 y.o. female  PRE-OPERATIVE DIAGNOSIS:   pleural effusion/empyema  POST-OPERATIVE DIAGNOSIS: pleural effusion/empyema  PROCEDURE:  Procedure(s): VIDEO ASSISTED THORACOSCOPY(RIGHT) DRAINAGE OF PLEURAL EFFUSION DECORTICATION  SURGEON:  Surgeon(s): Loreli Slot, MD  PHYSICIAN ASSISTANT: WAYNE GOLD PA-C  ANESTHESIA:   general  SPECIMEN:  Source of Specimen:  PLEURAL PEEL/EXUDATE/EFFUSION FOR CULTURES, PATHOLOGY, MICRO  DISPOSITION OF SPECIMEN:  Pathology  DRAINS: 2 Chest Tube(s) in the RIGHT HEMI-THORAX   PATIENT CONDITION:  PACU - hemodynamically stable.  PRE-OPERATIVE WEIGHT: 67kg  COMPLICATIONS: NO KNOWN  FINDINGS: Early organizing empyema, severe inflammatory reaction, tissues very friable

## 2012-12-21 NOTE — Preoperative (Signed)
Beta Blockers   Reason not to administer Beta Blockers:Not Applicable 

## 2012-12-21 NOTE — OR Nursing (Addendum)
Stage 2 wound present on sacrum with no dressing in place. Informed Gershon Crane, [place]. Will pass on to PACU staff and ask Dr. Dorris Fetch if he would like [place] to put dressing on before leaving OR.   Spoke with Dr. Dorris Fetch regarding sacral wound. He said we could place a sacral dressing. I reported off to PACU RN that we left dressing off for them to assess wound and gave a sacral dressing for them to put on.

## 2012-12-21 NOTE — Progress Notes (Signed)
ANTIBIOTIC CONSULT NOTE - INITIAL  Pharmacy Consult for Vancomycin / Zosyn Indication: empyema (s/p VATS)  Allergies  Allergen Reactions  . Ativan [Lorazepam]     Causes panic attacks  . Midazolam Other (See Comments)    Caused very crazy feelings, terrors, panic attacks, anxiety  . Zolpidem Tartrate Other (See Comments)    Caused very crazy feelings, terrors, shaking, panic attacks, anxiety  . Neomycin-Bacitracin Zn-Polymyx Itching and Rash    Patient Measurements: Height: 5' 4.5" (163.8 cm) Weight: 148 lb 1.6 oz (67.178 kg) IBW/kg (Calculated) : 55.85  Labs:  Recent Labs  12/19/12 0640 12/20/12 1850 12/21/12 1149 12/21/12 1334  WBC 10.2 36.3*  --   --   HGB 11.2* 10.5* 11.2* 10.5*  PLT 310 368  --   --   CREATININE 0.29* 0.33*  --   --    Estimated Creatinine Clearance: 64.2 ml/min (by C-G formula based on Cr of 0.33). No results found for this basename: VANCOTROUGH, Leodis Binet, VANCORANDOM, GENTTROUGH, GENTPEAK, GENTRANDOM, TOBRATROUGH, TOBRAPEAK, TOBRARND, AMIKACINPEAK, AMIKACINTROU, AMIKACIN,  in the last 72 hours   Microbiology: Recent Results (from the past 720 hour(s))  BODY FLUID CULTURE     Status: None   Collection Time    12/16/12  5:51 PM      Result Value Range Status   Specimen Description PLEURAL   Final   Special Requests Immunocompromised   Final   Gram Stain     Final   Value: MODERATE WBC PRESENT, PREDOMINANTLY PMN     NO ORGANISMS SEEN     Performed at [facility]   Culture     Final   Value: NO GROWTH 3 DAYS     Performed at [facility]   Report Status 12/20/2012 FINAL   Final  SURGICAL PCR SCREEN     Status: None   Collection Time    12/20/12  6:48 PM      Result Value Range Status   MRSA, PCR NEGATIVE  NEGATIVE Final   Staphylococcus aureus NEGATIVE  NEGATIVE Final   Comment:            The Xpert SA Assay (FDA     approved for NASAL specimens     in patients over 19 years of age),     is one component of     a  comprehensive surveillance     program.  Test performance has     been validated by [facility] for patients greater     than or equal to 57 year old.     It is not intended     to diagnose infection nor to     guide or monitor treatment.    Medical History: Past Medical History  Diagnosis Date  . Heart murmur, systolic 07/01/2011  . Postmenopausal HRT (hormone replacement therapy) 07/01/2011    surgical menopause  . Hx of hyperglycemia 07/01/2011  . Hx of compression fracture of spine 1985    t12 after a  20 foot fall  . Hx of fracture of wrist     after fall   . 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 Hx of colonic polyps   . Early cataract     stoneburner   . High frequency hearing loss     [facility] followed   . Diabetes mellitus without complication   . Cancer     colon  . Skin cancer   .  Complication of anesthesia     pt hadpanic attacks with versed, no versed    Assessment: 67 year old female with stage IV rectal carcinoma with lung metastasis who was admitted with increasing shortness of breath, pleural effusion, and progressive disease.  She underwent VATS today and is now starting broad spectrum antibiotics.  Weight = 67 kg, Scr stable at 0.33  Goal of Therapy:  Vancomycin trough level 15-20 mcg/ml Appropriate Zosyn dosing  Plan:  1) Zosyn 3.375 grams iv Q 8 hours - 4 hr infusion 2) Vancomycin 750 mg iv Q 12 hours 3) Follow up progress, Scr, cultures, fever trend.  Thank you. Okey Regal, PharmD [phone]  Elwin Sleight 12/21/2012,4:18 PM

## 2012-12-21 NOTE — Interval H&P Note (Signed)
History and Physical Interval Note:  12/21/2012 7:58 AM  Anne [name]  has presented today for surgery, with the diagnosis of malignant pleural effusion  The various methods of treatment have been discussed with the patient and family. After consideration of risks, benefits and other options for treatment, the patient has consented to  Procedure(s): VIDEO ASSISTED THORACOSCOPY (Right) DRAINAGE OF PLEURAL EFFUSION (Right) DECORTICATION (Right) as a surgical intervention .  The patient's history has been reviewed, patient examined, no change in status, stable for surgery.  I have reviewed the patient's chart and labs.  Questions were answered to the patient's satisfaction.     [name] C

## 2012-12-21 NOTE — Anesthesia Procedure Notes (Addendum)
Performed by: Elon Alas    Procedure Name: Intubation Date/Time: 12/21/2012 8:36 AM Performed by: Elon Alas Pre-anesthesia Checklist: Patient identified, Timeout performed, Emergency Drugs available, Suction available and Patient being monitored Patient Re-evaluated:Patient Re-evaluated prior to inductionOxygen Delivery Method: Circle system utilized Preoxygenation: Pre-oxygenation with 100% oxygen Intubation Type: IV induction Ventilation: Mask ventilation without difficulty Laryngoscope Size: Mac and 3 Endobronchial tube: Double lumen EBT and Left and 37 Fr Number of attempts: 1 Airway Equipment and Method: Stylet Placement Confirmation: positive ETCO2,  ETT inserted through vocal cords under direct vision and breath sounds checked- equal and bilateral Tube secured with: Tape Dental Injury: Teeth and Oropharynx as per pre-operative assessment

## 2012-12-22 ENCOUNTER — Inpatient Hospital Stay (HOSPITAL_COMMUNITY): Payer: 59

## 2012-12-22 ENCOUNTER — Encounter (HOSPITAL_COMMUNITY): Payer: Self-pay | Admitting: Thoracic Surgery (Cardiothoracic Vascular Surgery)

## 2012-12-22 LAB — BASIC METABOLIC PANEL
BUN: 6 mg/dL (ref 6–23)
CO2: 26 mEq/L (ref 19–32)
Calcium: 7.9 mg/dL — ABNORMAL LOW (ref 8.4–10.5)
Chloride: 95 mEq/L — ABNORMAL LOW (ref 96–112)
Creatinine, Ser: 0.31 mg/dL — ABNORMAL LOW (ref 0.50–1.10)
GFR calc Af Amer: >90 mL/min (ref >90)
GFR calc non Af Amer: >90 mL/min (ref >90)
Glucose, Bld: 201 mg/dL — ABNORMAL HIGH (ref 70–99)
Potassium: 3.5 mEq/L (ref 3.5–5.1)

## 2012-12-22 LAB — GLUCOSE, CAPILLARY
Glucose-Capillary: 206 mg/dL — ABNORMAL HIGH (ref 70–99)
Glucose-Capillary: 192 mg/dL — ABNORMAL HIGH (ref 70–99)
Glucose-Capillary: 160 mg/dL — ABNORMAL HIGH (ref 70–99)
Glucose-Capillary: 78 mg/dL (ref 70–99)
Glucose-Capillary: 189 mg/dL — ABNORMAL HIGH (ref 70–99)
Glucose-Capillary: 152 mg/dL — ABNORMAL HIGH (ref 70–99)

## 2012-12-22 LAB — POCT I-STAT 3, ART BLOOD GAS (G3+)
Acid-Base Excess: 3 mmol/L — ABNORMAL HIGH (ref 0.0–2.0)
Bicarbonate: 27.2 mEq/L — ABNORMAL HIGH (ref 20.0–24.0)
pCO2 arterial: 36.7 mmHg (ref 35.0–45.0)
pH, Arterial: 7.475 — ABNORMAL HIGH (ref 7.350–7.450)
pO2, Arterial: 78 mmHg — ABNORMAL LOW (ref 80.0–100.0)

## 2012-12-22 LAB — CBC
HCT: 24.7 % — ABNORMAL LOW (ref 36.0–46.0)
Hemoglobin: 8.4 g/dL — ABNORMAL LOW (ref 12.0–15.0)
MCHC: 34 g/dL (ref 30.0–36.0)
MCV: 70.8 fL — ABNORMAL LOW (ref 78.0–100.0)
RDW: 17.1 % — ABNORMAL HIGH (ref 11.5–15.5)

## 2012-12-22 MED ORDER — POTASSIUM CHLORIDE 10 MEQ/50ML IV SOLN
10.0000 meq | INTRAVENOUS | Status: AC
  Administered 2012-12-22 (×3): 10 meq via INTRAVENOUS
  Filled 2012-12-22: qty 50

## 2012-12-22 MED ORDER — INSULIN DETEMIR 100 UNIT/ML ~~LOC~~ SOLN
15.0000 [IU] | Freq: Once | SUBCUTANEOUS | Status: AC
  Administered 2012-12-22: 15 [IU] via SUBCUTANEOUS
  Filled 2012-12-22: qty 0.15

## 2012-12-22 MED ORDER — INSULIN ASPART 100 UNIT/ML ~~LOC~~ SOLN
0.0000 [IU] | SUBCUTANEOUS | Status: DC
  Administered 2012-12-22: 2 [IU] via SUBCUTANEOUS
  Administered 2012-12-22: 3 [IU] via SUBCUTANEOUS
  Administered 2012-12-23 (×2): 2 [IU] via SUBCUTANEOUS

## 2012-12-22 MED ORDER — INSULIN DETEMIR 100 UNIT/ML ~~LOC~~ SOLN
15.0000 [IU] | Freq: Every day | SUBCUTANEOUS | Status: DC
  Administered 2012-12-23: 15 [IU] via SUBCUTANEOUS
  Filled 2012-12-22: qty 0.15

## 2012-12-22 MED ORDER — ENOXAPARIN SODIUM 40 MG/0.4ML ~~LOC~~ SOLN
40.0000 mg | SUBCUTANEOUS | Status: DC
  Administered 2012-12-22 – 2012-12-27 (×6): 40 mg via SUBCUTANEOUS
  Filled 2012-12-22 (×6): qty 0.4

## 2012-12-22 NOTE — Op Note (Signed)
NAMEIYESHA, SUCH NO.:  [id number]  MEDICAL RECORD NO.:  [id number]  LOCATION:  2S01C                        FACILITY:  [facility]  PHYSICIAN:  Salvatore Decent. Dorris Fetch, M.D.DATE OF BIRTH:  September 15, 1945  DATE OF PROCEDURE:  12/21/2012 DATE OF DISCHARGE:                              OPERATIVE REPORT   PREOPERATIVE DIAGNOSIS:  Loculated right pleural effusion.  POSTOPERATIVE DIAGNOSIS:  Early organizing empyema.  PROCEDURE:  Right video-assisted thoracoscopy, drainage of pleural effusion, visceral and parietal pleural decortication.  SURGEON:  Salvatore Decent. Dorris Fetch, MD  ASSISTANT:  Rowe Clack, PA-C  ANESTHESIA:  General.  FINDINGS:  Severe early organizing empyema. Tissues extremely friable. Thick visceral pleural peel, very tedious to remove.  Specimen sent for pathology and cultures.  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  She recently initiated chemotherapy.  The weekend prior to admission, she developed a pleuritic right-sided chest pain and then developed progressive shortness of Breath. A chest x-ray showed a large pleural effusion.  A CT scan showed a loculated effusion essentially occupying the entire chest with extensive compressive atelectasis.  The initial thoracentesis provided some moderate relief but the patient requires thoracoscopic drainage and decortication to re-expand the lung.  The indications, risks, benefits, and alternatives were discussed in detail with the patient.  She understood and accepted the risks and agreed to proceed.  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  There Anesthesia placed a central line and arterial blood pressure monitoring line.  Intravenous antibiotics were administered.  She was taken to the operating room, anesthetized, and intubated with a double-lumen endotracheal tube.   A Foley catheter was placed.  PAS hose were placed for DVT prophylaxis.  She was placed in a left lateral decubitus position.  The right chest was prepped and draped in usual sterile fashion.  Single lung ventilation of the left lung was carried out.  Intermittently, the patient would desaturate and require additional intervention by Anesthesia with suctioning and bagging to have the sats come back up to an acceptable level.  An incision was made in approximately at the fourth intercostal space in the anterior axillary line and was carried through the skin and subcutaneous tissue.  The chest was entered bluntly using a hemostat.  A sucker was placed in the chest and yellow murky fluid was evacuated.  This was sent for both cytology and cultures for fungus, AFB, and bacteria. After evacuating the fluid, a port was placed into the chest and the scope was placed in the chest.  Adhesions were taken down to allow a more conventional incision in the lower chest in approximately the seventh intercostal space in the anterior axillary line.  The scope then was moved to this location.  Initially, the lung was separated from the chest wall.  There were multiple loculated fluid collections.  There was an intense inflammatory peel on both the visceral and parietal pleura. The tissue was extremely friable and bled easily.  There were particularly dense adhesions along the diaphragm and posteriorly and inferiorly.  After freeing up the lung  and removing all the loculated fluid collections, additional loculated fluid was isolated in the major fissure and this was evacuated as well.  There were some small visceral pleural tears in this area.  A parietal pleural decortication was performed.  A portion of the specimen was sent for pathology as well as for AFB, fungal and bacterial cultures.  There was moderate bleeding from the chest wall with the parietal pleural decortication. Intermittently, saline was  used to irrigate to help evacuate the blood. Finally, attention was turned to the visceral pleura.  There was an intense inflammatory fibrinous peel.  Decortication was performed of all 3 lobes.  This was a very tedious process which took a long period of time but was done with minimal avulsion of the visceral pleura.  Test inflation after completion of the decortication showed good expansion of all 3 lobes.  A third incision was made, and 32-French Blake drains were placed through the more inferior 2 incisions.  These were secured with #1 silk sutures.  The remaining incision was closed with #1 Vicryl fascial suture followed by 2-0 Vicryl subcutaneous suture and a 3-0 Vicryl subcuticular suture.  All sponge, needle, and instrument counts were correct at the end of the procedure.  There were no intraoperative complications.  The patient was taken from the operating room to the postanesthetic care unit in good condition.     Salvatore Decent Dorris Fetch, M.D.     SCH/MEDQ  D:  12/21/2012  T:  12/22/2012  Job:  [id number]

## 2012-12-22 NOTE — Progress Notes (Signed)
INITIAL NUTRITION ASSESSMENT  DOCUMENTATION CODES Per approved criteria  -Not Applicable   INTERVENTION: 1. Family to bring in some food items once diet is advanced   2. Encouraged protein rich foods  NUTRITION DIAGNOSIS: Increased nutrient needs related to pressure ulcer and CA as evidenced by estimated nutrition needs.   Goal: PO intake to meet >/=90% estimated nutrition needs.   Monitor:  PO intake, weight trends, labs   Reason for Assessment: Low Braden Score  67 y.o. female  Admitting Dx: <principal problem not specified>  ASSESSMENT: Pt with stage IV rectal CA with mets to lungs was admitted to [facility] on 9/19 with increased SOB and PE. Pt was transferred to [facility] for VATs procedure on 9/25.   Weight appears to be trending up, may be related to fluid accumulation.  Pt was actively losing weight prior to being dx with cancer. Since dx has been working on including more protein in her diet and following a soft diet. Pt did not like the supper meals while at [facility], RD ok'd family to bring in some food items, yogurt and Glucerna shakes in strawberry flavor once diet has been advanced.   Height: Ht Readings from Last 1 Encounters:  12/16/12 5' 4.5" (1.638 m)    Weight: Wt Readings from Last 1 Encounters:  12/22/12 149 lb 0.5 oz (67.6 kg)    Ideal Body Weight: 123 lbs   % Ideal Body Weight: 121%  Wt Readings from Last 10 Encounters:  12/22/12 149 lb 0.5 oz (67.6 kg)  12/22/12 149 lb 0.5 oz (67.6 kg)  12/16/12 138 lb 11.2 oz (62.914 kg)  12/13/12 132 lb 3.2 oz (59.966 kg)  12/06/12 133 lb 12.8 oz (60.691 kg)  11/30/12 140 lb 9.6 oz (63.776 kg)  11/22/12 141 lb 6.4 oz (64.139 kg)  11/18/12 138 lb 8 oz (62.823 kg)  11/03/12 140 lb (63.504 kg)  11/02/12 140 lb (63.504 kg)  07/07/12 160 lbs   Usual Body Weight: 160 lbs   % Usual Body Weight: 93%  BMI:  Body mass index is 25.2 kg/(m^2). WNL   Estimated Nutritional Needs: Kcal: 1900-2100 Protein: 75-85 gm  Fluid:  1.9-2.1 L   Skin: pressure ulcer stage 2 on sacrum, chest incisions  Diet Order: Clear Liquid  EDUCATION NEEDS: -No education needs identified at this time   Intake/Output Summary (Last 24 hours) at 12/22/12 1038 Last data filed at 12/22/12 0930  Gross per 24 hour  Intake 3764.74 ml  Output   3020 ml  Net 744.74 ml    Last BM: 9/23   Labs:   Recent Labs Lab 12/19/12 0640 12/20/12 1850 12/21/12 1149 12/22/12 0335  NA 123* 120* 122* 128*  K 4.0 3.8 3.9 3.5  CL 87* 85*  --  95*  CO2 26 26  --  26  BUN 5* 6  --  6  CREATININE 0.29* 0.33*  --  0.31*  CALCIUM 8.6 8.6  --  7.9*  GLUCOSE 201* 292*  --  201*    CBG (last 3)   Recent Labs  12/21/12 1724 12/21/12 1932 12/21/12 2306  GLUCAP 267* 250* 246*    Scheduled Meds: . acetaminophen  1,000 mg Oral Q6H   Or  . acetaminophen (TYLENOL) oral liquid 160 mg/5 mL  1,000 mg Oral Q6H  . bisacodyl  10 mg Oral Daily  . enoxaparin (LOVENOX) injection  40 mg Subcutaneous Q24H  . estradiol  0.1 mg Transdermal Weekly  . fentaNYL   Intravenous Q4H  .  Gerhardt's butt cream   Topical BID  . [START ON 12/23/2012] insulin detemir  15 Units Subcutaneous Daily  . MethylTESTOSTERone   Oral Daily  . piperacillin-tazobactam (ZOSYN)  IV  3.375 g Intravenous Q8H  . vancomycin  750 mg Intravenous Q12H    Continuous Infusions: . sodium chloride 75 mL/hr at 12/22/12 0739  . insulin (NOVOLIN-R) infusion 2.9 mL/hr at 12/22/12 0930    Past Medical History  Diagnosis Date  . Heart murmur, systolic 07/01/2011  . Postmenopausal HRT (hormone replacement therapy) 07/01/2011    surgical menopause  . Hx of hyperglycemia 07/01/2011  . Hx of compression fracture of spine 1985    t12 after a  20 foot fall  . Hx of fracture of wrist     after fall   . 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 Hx of colonic polyps   . Early cataract     stoneburner   . High frequency hearing loss     kraus followed   . Diabetes mellitus without  complication   . Cancer     colon  . Skin cancer   . Complication of anesthesia     pt hadpanic attacks with versed, no versed    Past Surgical History  Procedure Laterality Date  . Tonsillectomy  1952  . Adenoidectomy  1952  . Wrist surgery  1957    left reduction  . Compression fraction  1985    T12  . Anterior cruciate ligament repair  1993    right Murphy  . Cosmetic surgery  1999-2007    holderness blepharoplasty facial rhytidectomy mastopexy abdominoplasty   . Dental implant    . Dilation and curettage of uterus  1977  . Total abdominal hysterectomy  2005    with cystocele repair   . Colonoscopy N/A 10/24/2012    Procedure: COLONOSCOPY;  Surgeon: Hilarie Fredrickson, MD;  Location: [facility] ENDOSCOPY;  Service: Endoscopy;  Laterality: N/A;  . Colonoscopy N/A 10/25/2012    Procedure: COLONOSCOPY;  Surgeon: Hilarie Fredrickson, MD;  Location[facility] ENDOSCOPY;  Service: Endoscopy;  Laterality: N/A;  . Video bronchoscopy Bilateral 10/27/2012    Procedure: VIDEO BRONCHOSCOPY WITH FLUORO;  Surgeon: Coralyn Helling, MD;  Location: [facility] ENDOSCOPY;  Service: Endoscopy;  Laterality: Bilateral;  . Portacath placement Left 11/21/2012    Procedure: INSERTION PORT-A-CATH;  Surgeon: Loreli Slot, MD;  Location: [facility] OR;  Service: Thoracic;  Laterality: Left;    Isabell Jarvis RD, LDN Pager [phone] After Hours pager [phone]

## 2012-12-22 NOTE — Progress Notes (Signed)
Patient requests to leave foley catheter in place until at least midnight tonight, or early morning secondary to incontinence concerns and inability to rest tonight without foley.

## 2012-12-22 NOTE — Progress Notes (Signed)
Note: Patient refuses to get up OOB and ambulate at this time when offered despite encouragement to do so.  Patient sits up in bed, and turns self without difficulty.  Patient also refused bathing and brushing teeth when offered this shift, stating that she would "prefer to do all that before bedtime" so she can sleep better; Per patient statement.  Incentive spirometer used frequently per patient with 750 achieved on I/S.  Resp therapist, Trey Paula, made aware twice of the need for flutter valve as ordered.  No acute distress noted. Safety maintained. VSS.

## 2012-12-22 NOTE — Progress Notes (Addendum)
      [address]       [name] [address]             [phone]      1 Day Post-Op Procedure(s) (LRB): VIDEO ASSISTED THORACOSCOPY (Right) DRAINAGE OF PLEURAL EFFUSION (Right) DECORTICATION (Right)  Subjective:  [name] states she feels okay this morning.  Her pain is well controlled with PCA.  Objective: Vital signs in last 24 hours: Temp:  [97.4 F (36.3 C)-98.6 F (37 C)] 97.4 F (36.3 C) (09/25 0359) Pulse Rate:  [72-117] 74 (09/25 0600) Cardiac Rhythm:  [-] Normal sinus rhythm;Sinus tachycardia (09/24 1700) Resp:  [10-31] 12 (09/25 0600) BP: (98-138)/(50-76) 110/52 mmHg (09/25 0600) SpO2:  [88 %-98 %] 97 % (09/25 0600) Arterial Line BP: (64-135)/(45-73) 86/58 mmHg (09/25 0600) Weight:  [149 lb 0.5 oz (67.6 kg)] 149 lb 0.5 oz (67.6 kg) (09/25 0600)  Intake/Output from previous day: 09/24 0701 - 09/25 0700 In: 3589.7 [P.O.:50; I.V.:3127.2; IV Piggyback:412.5] Out: 3345 [Urine:2640; Blood:150; Chest Tube:555]  General appearance: alert, cooperative and no distress Heart: regular rate and rhythm Lungs: diminished breath sounds RLL Abdomen: soft, non-tender; bowel sounds normal; no masses,  no organomegaly Extremities: extremities normal, atraumatic, no cyanosis or edema Wound: clean and dry  Lab Results:  Recent Labs  12/20/12 1850  12/21/12 1334 12/22/12 0335  WBC 36.3*  --   --  37.7*  HGB 10.5*  < > 10.5* 8.4*  HCT 31.2*  < > 31.7* 24.7*  PLT 368  --   --  298  < > = values in this interval not displayed. BMET:  Recent Labs  12/20/12 1850 12/21/12 1149 12/22/12 0335  NA 120* 122* 128*  K 3.8 3.9 3.5  CL 85*  --  95*  CO2 26  --  26  GLUCOSE 292*  --  201*  BUN 6  --  6  CREATININE 0.33*  --  0.31*  CALCIUM 8.6  --  7.9*    PT/INR:  Recent Labs  12/20/12 1850  LABPROT 15.5*  INR 1.26   ABG    Component Value Date/Time   PHART 7.475* 12/22/2012 0446   HCO3 27.2* 12/22/2012 0446   TCO2 28 12/22/2012 0446   ACIDBASEDEF 1.0 12/21/2012 1149   O2SAT 97.0 12/22/2012 0446   CBG (last 3)   Recent Labs  12/21/12 1724 12/21/12 1932 12/21/12 2306  GLUCAP 267* 250* 246*    Assessment/Plan: S/P Procedure(s) (LRB): VIDEO ASSISTED THORACOSCOPY (Right) DRAINAGE OF PLEURAL EFFUSION (Right) DECORTICATION (Right)  1. Chest tube- no air leak appreciated, 555 cc output since surgery, CXR with continued effusion/opacity on RLL 2. Pulm- wean oxygen as tolerated, encouraged use of IS 3. Expected Acute Blood Loss Anemia- Hgb down to 8.4 will monitor 4. Empyema- continue Vanc, Zosyn, OR cultures pending 5. DM- on insulin drip, will start Levemir 6. D/C Arterial Line 7. Decrease IV Fluids 8. D/C Foley catheter 9. Dispo- stable, likely place chest tube to water seal, ? Transfer to 3S  LOS: 6 days    Lowella Dandy 12/22/2012  She looks good. Pain is well controlled Her CBGs have been elevated and required insulin gtt overnight- will convert to SSI Keep CT to suction another 24 hours, then water seal GS showed GNR and GPC- continue vanco and zosyn pending culture results Add flutter valve

## 2012-12-22 NOTE — Progress Notes (Signed)
PM ROUNDS  Some incisional pain  BP 136/98  Pulse 88  Temp(Src) 97.8 F (36.6 C) (Oral)  Resp 19  Ht 5' 4.5" (1.638 m)  Wt 149 lb 0.5 oz (67.6 kg)  BMI 25.2 kg/m2  SpO2 98%   Intake/Output Summary (Last 24 hours) at 12/22/12 1702 Last data filed at 12/22/12 1600  Gross per 24 hour  Intake 2904.74 ml  Output   3665 ml  Net -760.26 ml    Ambulated this AM, but doesn't feel as well this afternoon  continue vanco and zosyn

## 2012-12-22 NOTE — Progress Notes (Signed)
Patient up OOB and in chair all morning, and ambulated approximately 300 feet at this time around the unit x 1 assist with use of wheelchair support.  No acute distress noted. Tolerated well. VSS.

## 2012-12-22 NOTE — Progress Notes (Signed)
Reported off to oncoming shift RN. Safety maintained. No acute distress noted. VSS.  

## 2012-12-23 ENCOUNTER — Inpatient Hospital Stay (HOSPITAL_COMMUNITY): Payer: 59

## 2012-12-23 LAB — GLUCOSE, CAPILLARY
Glucose-Capillary: 113 mg/dL — ABNORMAL HIGH (ref 70–99)
Glucose-Capillary: 222 mg/dL — ABNORMAL HIGH (ref 70–99)
Glucose-Capillary: 171 mg/dL — ABNORMAL HIGH (ref 70–99)

## 2012-12-23 LAB — COMPREHENSIVE METABOLIC PANEL
AST: 40 U/L — ABNORMAL HIGH (ref 0–37)
Albumin: 1.8 g/dL — ABNORMAL LOW (ref 3.5–5.2)
Alkaline Phosphatase: 121 U/L — ABNORMAL HIGH (ref 39–117)
BUN: 8 mg/dL (ref 6–23)
Calcium: 8.6 mg/dL (ref 8.4–10.5)
Potassium: 4.5 mEq/L (ref 3.5–5.1)
Sodium: 131 mEq/L — ABNORMAL LOW (ref 135–145)
Total Bilirubin: 0.1 mg/dL — ABNORMAL LOW (ref 0.3–1.2)
Total Protein: 5.5 g/dL — ABNORMAL LOW (ref 6.0–8.3)

## 2012-12-23 LAB — CBC
MCHC: 33.9 g/dL (ref 30.0–36.0)
RDW: 17.7 % — ABNORMAL HIGH (ref 11.5–15.5)

## 2012-12-23 MED ORDER — POLYETHYLENE GLYCOL 3350 17 G PO PACK
17.0000 g | PACK | Freq: Every day | ORAL | Status: DC | PRN
  Administered 2012-12-24: 17 g via ORAL
  Filled 2012-12-23 (×2): qty 1

## 2012-12-23 MED ORDER — NONFORMULARY OR COMPOUNDED ITEM
1.2500 mg | Freq: Every day | Status: DC
  Administered 2012-12-25 – 2012-12-27 (×3): 1.25 mg via ORAL

## 2012-12-23 MED ORDER — MEGESTROL ACETATE 625 MG/5ML PO SUSP: 625.0000 mg | Freq: Every day | ORAL | Status: DC

## 2012-12-23 MED ORDER — INSULIN ASPART 100 UNIT/ML ~~LOC~~ SOLN
0.0000 [IU] | Freq: Three times a day (TID) | SUBCUTANEOUS | Status: DC
  Administered 2012-12-23: 3 [IU] via SUBCUTANEOUS
  Administered 2012-12-23: 5 [IU] via SUBCUTANEOUS
  Administered 2012-12-24: 2 [IU] via SUBCUTANEOUS
  Administered 2012-12-24: 3 [IU] via SUBCUTANEOUS
  Administered 2012-12-24: 5 [IU] via SUBCUTANEOUS
  Administered 2012-12-25: 10:00:00 via SUBCUTANEOUS
  Administered 2012-12-25: 5 [IU] via SUBCUTANEOUS
  Administered 2012-12-25 – 2012-12-26 (×2): 3 [IU] via SUBCUTANEOUS
  Administered 2012-12-26 (×2): 5 [IU] via SUBCUTANEOUS
  Administered 2012-12-27: 3 [IU] via SUBCUTANEOUS

## 2012-12-23 MED ORDER — NONFORMULARY OR COMPOUNDED ITEM: 1.2500 mg | Freq: Every day | Status: DC

## 2012-12-23 MED ORDER — ESTRADIOL 0.0375 MG/24HR TD PTTW: 1.0000 | MEDICATED_PATCH | TRANSDERMAL | Status: DC

## 2012-12-23 MED ORDER — NONFORMULARY OR COMPOUNDED ITEM
1.0000 | Status: DC
  Administered 2012-12-25: 1 via TOPICAL
  Filled 2012-12-23: qty 1

## 2012-12-23 MED ORDER — MEGESTROL ACETATE 400 MG/10ML PO SUSP
800.0000 mg | Freq: Every day | ORAL | Status: DC
  Administered 2012-12-23 – 2012-12-27 (×5): 800 mg via ORAL
  Filled 2012-12-23 (×5): qty 20

## 2012-12-23 MED ORDER — METFORMIN HCL 500 MG PO TABS
1000.0000 mg | ORAL_TABLET | Freq: Two times a day (BID) | ORAL | Status: DC
  Administered 2012-12-24 – 2012-12-27 (×7): 1000 mg via ORAL
  Filled 2012-12-23 (×10): qty 2

## 2012-12-23 MED ORDER — BOOST / RESOURCE BREEZE PO LIQD
237.0000 mL | Freq: Three times a day (TID) | ORAL | Status: DC
  Administered 2012-12-23 – 2012-12-27 (×4): 1 via ORAL

## 2012-12-23 MED ORDER — LINAGLIPTIN 5 MG PO TABS
5.0000 mg | ORAL_TABLET | Freq: Every day | ORAL | Status: DC
  Administered 2012-12-23 – 2012-12-27 (×5): 5 mg via ORAL
  Filled 2012-12-23 (×5): qty 1

## 2012-12-23 NOTE — Progress Notes (Addendum)
      [address]       [place] [address]             [phone]      2 Days Post-Op Procedure(s) (LRB): VIDEO ASSISTED THORACOSCOPY (Right) DRAINAGE OF PLEURAL EFFUSION (Right) DECORTICATION (Right)  Subjective:  [name] is doing well this morning.  She is ambulating without difficulty.  Good use of IS. No BM  Objective: Vital signs in last 24 hours: Temp:  [97.4 F (36.3 C)-98.5 F (36.9 C)] 97.4 F (36.3 C) (09/26 0400) Pulse Rate:  [76-99] 80 (09/26 0700) Cardiac Rhythm:  [-] Normal sinus rhythm (09/25 2000) Resp:  [10-28] 10 (09/26 0700) BP: (112-138)/(49-98) 125/59 mmHg (09/26 0700) SpO2:  [92 %-100 %] 93 % (09/26 0700) Arterial Line BP: (65-115)/(49-76) 88/76 mmHg (09/25 0900)  Intake/Output from previous day: 09/25 0701 - 09/26 0700 In: 3720 [P.O.:1320; I.V.:1800; IV Piggyback:600] Out: 7253 [GUYQI:3474; Chest Tube:270]  General appearance: alert, cooperative and no distress Heart: regular rate and rhythm Lungs: clear to auscultation bilaterally Abdomen: soft, non-tender; bowel sounds normal; no masses,  no organomegaly Wound: clean and dry  Lab Results:  Recent Labs  12/22/12 0335 12/23/12 0343  WBC 37.7* 38.1*  HGB 8.4* 9.3*  HCT 24.7* 27.4*  PLT 298 364   BMET:  Recent Labs  12/22/12 0335 12/23/12 0343  NA 128* 131*  K 3.5 4.5  CL 95* 95*  CO2 26 28  GLUCOSE 201* 120*  BUN 6 8  CREATININE 0.31* 0.29*  CALCIUM 7.9* 8.6    PT/INR:  Recent Labs  12/20/12 1850  LABPROT 15.5*  INR 1.26   ABG    Component Value Date/Time   PHART 7.475* 12/22/2012 0446   HCO3 27.2* 12/22/2012 0446   TCO2 28 12/22/2012 0446   ACIDBASEDEF 1.0 12/21/2012 1149   O2SAT 97.0 12/22/2012 0446   CBG (last 3)   Recent Labs  12/22/12 1923 12/22/12 2352 12/23/12 0342  GLUCAP 152* 149* 113*    Assessment/Plan: S/P Procedure(s) (LRB): VIDEO ASSISTED THORACOSCOPY (Right) DRAINAGE OF PLEURAL EFFUSION (Right) DECORTICATION  (Right)  1. Chest tube- 270 cc output yesterday, no air leak appreciated- CXR with continued atelectasis, new right basilar pneumothorax (small) 2. Pulm- off oxygen, continue use of IS 3. Expected Acute Blood Loss Anemia- Hgb improved to 9.3 4. Emypema- continue Vanc and Zosyn, + GS, culture results pending 5. DM- CBGs improved, continue current regimen, can transition to oral medications tomorrow if sugars remain controlled 6. IV fluids- to KVO 7. Dispo- patient doing well, CXR with small basilar pneumothorax, no air leak from chest tubes- transfer to 3300, chest tube management per staff   LOS: 7 days    Lowella Dandy 12/23/2012  Patient seen and examined. Agree with above Her CT are still putting out a fair amount- will keep both today, may be able to dc anterior tube in AM CXR improving- still has some pleural thickening, aeration of right lung a little better CBG improved Continue vanco + zosyn pending culture results PATH- inflammation- no malignant cells- patient informed

## 2012-12-23 NOTE — Progress Notes (Signed)
Report called to 3S. Pt was transferred to 3S13. All belongings went with pt and vss were stable at time of transfer

## 2012-12-23 NOTE — Progress Notes (Signed)
Came to visit Dr Ferd Glassing at bedside on behalf of Crisoforo Oxford to [facility] program/[facility] Care Management services. Reports she is trying to rest currently as she has not had much sleep. Appreciative of visit. Will continue to follow as she remains inpatient and offer support as needed. She is also followed by [facility] case management.  Raiford Noble, MSN-Ed, RN,BSN- [facility] [phone]

## 2012-12-23 NOTE — Progress Notes (Addendum)
Patient's sister brought minivelle and methyltest from home.  Patient took methyltest and placed miniville on left abdomen.  After medication taken, notified nurse. Patient aware of not self medicating.  Medication taken to pharmacy. Dr. Dorris Fetch will order minivelle.  Continue to watch.

## 2012-12-24 ENCOUNTER — Inpatient Hospital Stay (HOSPITAL_COMMUNITY): Payer: 59

## 2012-12-24 LAB — GLUCOSE, CAPILLARY
Glucose-Capillary: 188 mg/dL — ABNORMAL HIGH (ref 70–99)
Glucose-Capillary: 131 mg/dL — ABNORMAL HIGH (ref 70–99)
Glucose-Capillary: 203 mg/dL — ABNORMAL HIGH (ref 70–99)
Glucose-Capillary: 170 mg/dL — ABNORMAL HIGH (ref 70–99)

## 2012-12-24 LAB — TISSUE CULTURE
Gram Stain: NONE SEEN
Gram Stain: NONE SEEN

## 2012-12-24 LAB — BODY FLUID CULTURE

## 2012-12-24 LAB — VANCOMYCIN, TROUGH: Vancomycin Tr: 5.9 ug/mL — ABNORMAL LOW (ref 10.0–20.0)

## 2012-12-24 MED ORDER — METFORMIN HCL 500 MG PO TABS: 1000.0000 mg | ORAL_TABLET | Freq: Two times a day (BID) | ORAL | Status: DC

## 2012-12-24 MED ORDER — SODIUM CHLORIDE 0.9 % IJ SOLN
10.0000 mL | Freq: Two times a day (BID) | INTRAMUSCULAR | Status: DC
  Administered 2012-12-24: 15 mL
  Administered 2012-12-24 – 2012-12-25 (×3): 10 mL
  Administered 2012-12-26: 20 mL
  Administered 2012-12-26: 10 mL
  Filled 2012-12-24: qty 10

## 2012-12-24 MED ORDER — METOPROLOL TARTRATE 1 MG/ML IV SOLN
INTRAVENOUS | Status: AC
  Filled 2012-12-24: qty 5

## 2012-12-24 MED ORDER — METOPROLOL TARTRATE 1 MG/ML IV SOLN
5.0000 mg | INTRAVENOUS | Status: AC
  Administered 2012-12-24: 5 mg via INTRAVENOUS

## 2012-12-24 MED ORDER — VANCOMYCIN HCL IN DEXTROSE 1-5 GM/200ML-% IV SOLN
1000.0000 mg | Freq: Three times a day (TID) | INTRAVENOUS | Status: DC
  Administered 2012-12-24 – 2012-12-25 (×2): 1000 mg via INTRAVENOUS
  Filled 2012-12-24 (×3): qty 200

## 2012-12-24 MED ORDER — SODIUM CHLORIDE 0.9 % IJ SOLN
10.0000 mL | INTRAMUSCULAR | Status: DC | PRN
  Administered 2012-12-27: 10 mL

## 2012-12-24 MED ORDER — LINAGLIPTIN 5 MG PO TABS: 5.0000 mg | ORAL_TABLET | Freq: Every day | ORAL | Status: DC

## 2012-12-24 NOTE — Progress Notes (Signed)
Patient HR increased to 130 Beats per min patient is resting in the chair with family at bedside. MD notified and gave TVO order to give Lopressor 5mg  IV x1 dose.

## 2012-12-24 NOTE — Progress Notes (Addendum)
      [address]       [place] [address]             [phone]      3 Days Post-Op Procedure(s) (LRB): VIDEO ASSISTED THORACOSCOPY (Right) DRAINAGE OF PLEURAL EFFUSION (Right) DECORTICATION (Right)  Subjective:  Ms. [place] has no new complaints.  She states she continues to do okay. No BM, and per patient with colorectal cancer she goes several days without BM  Objective: Vital signs in last 24 hours: Temp:  [97.3 F (36.3 C)-98.9 F (37.2 C)] 98.9 F (37.2 C) (09/27 0700) Pulse Rate:  [88-107] 88 (09/27 0440) Cardiac Rhythm:  [-] Normal sinus rhythm (09/26 2000) Resp:  [15-25] 21 (09/27 0834) BP: (131-145)/(63-77) 140/67 mmHg (09/27 0440) SpO2:  [95 %-98 %] 95 % (09/27 0834) FiO2 (%):  [0 %] 0 % (09/26 1135)   Intake/Output from previous day: 09/26 0701 - 09/27 0700 In: 1613.3 [P.O.:860; I.V.:303.3; IV Piggyback:450] Out: 5250 [Urine:5150; Chest Tube:100]  General appearance: alert, cooperative and no distress Heart: regular rate and rhythm Lungs: clear to auscultation bilaterally Abdomen: soft, non-tender; bowel sounds normal; no masses,  no organomegaly Wound: clean and dry  Lab Results:  Recent Labs  12/22/12 0335 12/23/12 0343  WBC 37.7* 38.1*  HGB 8.4* 9.3*  HCT 24.7* 27.4*  PLT 298 364   BMET:  Recent Labs  12/22/12 0335 12/23/12 0343  NA 128* 131*  K 3.5 4.5  CL 95* 95*  CO2 26 28  GLUCOSE 201* 120*  BUN 6 8  CREATININE 0.31* 0.29*  CALCIUM 7.9* 8.6    PT/INR: No results found for this basename: LABPROT, INR,  in the last 72 hours ABG    Component Value Date/Time   PHART 7.475* 12/22/2012 0446   HCO3 27.2* 12/22/2012 0446   TCO2 28 12/22/2012 0446   ACIDBASEDEF 1.0 12/21/2012 1149   O2SAT 97.0 12/22/2012 0446   CBG (last 3)   Recent Labs  12/23/12 1115 12/23/12 1549 12/23/12 2139  GLUCAP 222* 188* 171*    Assessment/Plan: S/P Procedure(s) (LRB): VIDEO ASSISTED THORACOSCOPY (Right) DRAINAGE OF PLEURAL  EFFUSION (Right) DECORTICATION (Right)  1. Chest tube- output decreasing 110 cc yesterday, no air leak appreciated- will place on water seal this morning, likely remove anterior chest tube 2. Pulm- off oxygen, continue IS 3. Empyema- on Vanc and Zosyn, culture remains pending, + GS 4. DM- CBGs stable, will continue SSIP, d/c Levemir and restart home medications 5. Dispo- patient stable, CXR with stable, will discuss chest tube removal with staff   LOS: 8 days    BARRETT, ERIN 12/24/2012  patient examined and medical record reviewed,agree with above note. VAN TRIGT III,Bethel Sirois 12/24/2012

## 2012-12-24 NOTE — Progress Notes (Signed)
ANTIBIOTIC CONSULT NOTE - FOLLOW UP  Pharmacy Consult for vancomycin Indication: empyema  Allergies  Allergen Reactions  . Ativan [Lorazepam]     Causes panic attacks  . Midazolam Other (See Comments)    Caused very crazy feelings, terrors, panic attacks, anxiety  . Zolpidem Tartrate Other (See Comments)    Caused very crazy feelings, terrors, shaking, panic attacks, anxiety  . Neomycin-Bacitracin Zn-Polymyx Itching and Rash    Patient Measurements: Height: 5' 4.5" (163.8 cm) Weight: 149 lb 0.5 oz (67.6 kg) IBW/kg (Calculated) : 55.85  Vital Signs: Temp: 98.6 F (37 C) (09/27 1539) Temp src: Oral (09/27 1539) BP: 128/65 mmHg (09/27 1539) Pulse Rate: 93 (09/27 1539) Intake/Output from previous day: 09/26 0701 - 09/27 0700 In: 1613.3 [P.O.:860; I.V.:303.3; IV Piggyback:450] Out: 5250 [Urine:5150; Chest Tube:100] Intake/Output from this shift: Total I/O In: 1707 [P.O.:960; I.V.:547; IV Piggyback:200] Out: 301 [Urine:301]  Labs:  Recent Labs  12/22/12 0335 12/23/12 0343  WBC 37.7* 38.1*  HGB 8.4* 9.3*  PLT 298 364  CREATININE 0.31* 0.29*   Estimated Creatinine Clearance: 64.4 ml/min (by C-G formula based on Cr of 0.29).  Recent Labs  12/24/12 1541  VANCOTROUGH 5.9*       Assessment: 67 year old female with stage IV rectal carcinoma with lung metastasis who was admitted with increasing shortness of breath. Patient now s/p VATS on 9/24 on vancomycin for empyema. A vancomycin trough today was 5.9 on 750mg  IV q12h. Last vancomycin dose given at about 4:30pm   Goal of Therapy:  Vancomycin trough level 15-20 mcg/ml  Plan:   -Change vancomycin to 1000mg  IV q8h -Will recheck a vancomycin trough at steady state  Harland German, Pharm D 12/24/2012 5:59 PM

## 2012-12-25 ENCOUNTER — Inpatient Hospital Stay (HOSPITAL_COMMUNITY): Payer: 59

## 2012-12-25 LAB — GLUCOSE, CAPILLARY
Glucose-Capillary: 169 mg/dL — ABNORMAL HIGH (ref 70–99)
Glucose-Capillary: 242 mg/dL — ABNORMAL HIGH (ref 70–99)

## 2012-12-25 LAB — TYPE AND SCREEN
Antibody Screen: NEGATIVE
Unit division: 0

## 2012-12-25 MED ORDER — ESTRADIOL 0.0375 MG/24HR TD PTTW: 1.0000 | MEDICATED_PATCH | TRANSDERMAL | Status: DC

## 2012-12-25 NOTE — Progress Notes (Addendum)
      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  This has improved since removal of a chest tube yesterday.   Objective: Vital signs in last 24 hours: Temp:  [97.6 F (36.4 C)-98.8 F (37.1 C)] 98 F (36.7 C) (09/28 0700) Pulse Rate:  [80-137] 80 (09/28 0400) Cardiac Rhythm:  [-] Normal sinus rhythm (09/28 0347) Resp:  [8-26] 8 (09/28 0400) BP: (128-139)/(65-68) 137/67 mmHg (09/28 0347) SpO2:  [94 %-99 %] 94 % (09/28 0400)  Intake/Output from previous day: 09/27 0701 - 09/28 0700 In: 2197 [P.O.:960; I.V.:787; IV Piggyback:450] Out: 1701 [Urine:1626; Chest Tube:75] Intake/Output this shift: Total I/O In: -  Out: 300 [Urine:300]  General appearance: alert, cooperative and no distress Heart: regular rate and rhythm Lungs: diminished breath sounds bibasilar Abdomen: soft, non-tender; bowel sounds normal; no masses,  no organomegaly Extremities: extremities normal, atraumatic, no cyanosis or edema Wound: clean and dry  Lab Results:  Recent Labs  12/23/12 0343  WBC 38.1*  HGB 9.3*  HCT 27.4*  PLT 364   BMET:  Recent Labs  12/23/12 0343  NA 131*  K 4.5  CL 95*  CO2 28  GLUCOSE 120*  BUN 8  CREATININE 0.29*  CALCIUM 8.6    PT/INR: No results found for this basename: LABPROT, INR,  in the last 72 hours ABG    Component Value Date/Time   PHART 7.475* 12/22/2012 0446   HCO3 27.2* 12/22/2012 0446   TCO2 28 12/22/2012 0446   ACIDBASEDEF 1.0 12/21/2012 1149   O2SAT 97.0 12/22/2012 0446   CBG (last 3)   Recent Labs  12/24/12 1105 12/24/12 1702 12/24/12 2130  GLUCAP 203* 170* 194*    Assessment/Plan: S/P Procedure(s) (LRB): VIDEO ASSISTED THORACOSCOPY (Right) DRAINAGE OF PLEURAL EFFUSION  (Right) DECORTICATION (Right)  1. Chest tube- no air leak appreciated, 75 cc output- CXR not available in EPIC to review 2. Pulm- off oxygen, continue IS 3. Emypema- OR culture + Strep Viridans- currently on Vanc and Zosyn- however sensitive to PCN will discuss change in ABX 4. DM- CBGs remain stable 5. Dispo- patient stable, CXR not available will review and chest tube management per staff   LOS: 9 days    BARRETT, ERIN 12/25/2012  Pleural material removed at surgery culture positive for Streptococcus. V. so will focused antibiotic coverage to Zosyn and leave chest tube another day to complete drainage of empyema.

## 2012-12-25 NOTE — Progress Notes (Signed)
5CC of IV fentanyl wasted 2ND RN Maggie witness. Wasted in [place].

## 2012-12-26 ENCOUNTER — Inpatient Hospital Stay (HOSPITAL_COMMUNITY): Payer: 59

## 2012-12-26 LAB — GLUCOSE, CAPILLARY
Glucose-Capillary: 213 mg/dL — ABNORMAL HIGH (ref 70–99)
Glucose-Capillary: 209 mg/dL — ABNORMAL HIGH (ref 70–99)

## 2012-12-26 MED ORDER — AMOXICILLIN-POT CLAVULANATE 875-125 MG PO TABS
1.0000 | ORAL_TABLET | Freq: Two times a day (BID) | ORAL | Status: DC
  Administered 2012-12-26 – 2012-12-27 (×2): 1 via ORAL
  Filled 2012-12-26 (×3): qty 1

## 2012-12-26 NOTE — Discharge Summary (Signed)
[address]       [place] [address]             [phone]              Discharge Summary  Name: Anne [name] DOB: 23-Feb-1946 67 y.o. MRN: [id number]   Admission Date: 12/16/2012 Discharge Date: 12/27/2012    Admitting Diagnosis: Shortness of breath  Chest pain    Discharge Diagnosis:  Loculated right pleural effusion Streptococcus viridans empyema Expected postoperative blood loss anemia  Past Medical History  Diagnosis Date  . Heart murmur, systolic 07/01/2011  . Postmenopausal HRT (hormone replacement therapy) 07/01/2011    surgical menopause  . Hx of hyperglycemia 07/01/2011  . Hx of compression fracture of spine 1985    t12 after a  20 foot fall  . Hx of fracture of wrist     after fall   . 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 Hx of colonic polyps   . Early cataract     stoneburner   . High frequency hearing loss     [facility] followed   . Diabetes mellitus without complication   . Cancer     colon  . Skin cancer   . Complication of anesthesia     pt hadpanic attacks with versed, no versed      Procedures: RIGHT VIDEO ASSISTED THORACOSCOPY - 12/21/2012  DRAINAGE OF PLEURAL EFFUSION  DECORTICATION     HPI:  The patient is a 67 y.o. female with stage IV colorectal cancer, known to Dr. Dorris Fetch from previous evaluation for lung metastasis.  She has undergone 2 cycles of chemotherapy. Recently, she developed right sided pleuritic chest pain. Initially this was anterior, but after a severe coughing spell, it moved to her right side and back. She had a CT scan on 9/16 which showed the right lung metastasis had increased in size. There was a new small right effusion. On the day of admission, she became more short of breath and a repeat chest x-ray showed a large pleural effusion. A repeat CT showed the right chest was essentially completely filled with a loculated pleural effusion. There was extensive compressive  atelectasis. She was subsequently admitted to [facility] for further workup.    Hospital Course:  The patient was admitted to [facility] on 12/16/2012. A thoracic surgery consult was obtained for recommendations regarding her right effusion.  Dr. Dorris Fetch saw the patient, and a right thoracentesis was performed at the bedside, yielding 1.5 liters of straw colored fluid.  She tolerated the procedure well, and did symptomatically improve.  However, she had a persistent large loculated right effusion on chest x-ray.  It was felt that she would require a VATS for drainage.  All risks, benefits and alternatives of surgery were explained in detail, and the patient agreed to proceed.   The patient was transferred to [facility] on 12/21/2012, and was taken to the operating room, where she underwent the above procedure.    The postoperative course has generally been uneventful.  Her chest tubes have been removed in the standard fashion, and follow up chest x-rays have remained stable.  Final pathology revealed inflammation, with no evidence of malignancy.  Intraoperative cultures were positive for Streptococcus viridans.  She was treated with 7 days of IV Vancomycin and Zosyn, and has now been switched to po Augmentin to complete an additional 14 days.  She is otherwise doing well. Incisions  are healing well.  She has remained afebrile and all vital signs are stable. She is tolerating a regular diet and ambulating in the halls.  She has been evaluated on today's date, and is medically stable for discharge home.      Recent vital signs:  Filed Vitals:   12/27/12 0855  BP:   Pulse: 113  Temp:   Resp: 21    Recent laboratory studies:  CBC:No results found for this basename: WBC, HGB, HCT, PLT,  in the last 72 hours BMET: No results found for this basename: NA, K, CL, CO2, GLUCOSE, BUN, CREATININE, CALCIUM,  in the last 72 hours  PT/INR: No results found for this basename: LABPROT, INR,  in the last 72  hours   Discharge Medications:     Medication List    STOP taking these medications       oxyCODONE 5 MG immediate release tablet  Commonly known as:  ROXICODONE      TAKE these medications       amoxicillin-clavulanate 875-125 MG per tablet  Commonly known as:  AUGMENTIN  Take 1 tablet by mouth 2 (two) times daily.     CALTRATE 600+D PO  Take 1 tablet by mouth daily.     dexamethasone 4 MG tablet  Commonly known as:  DECADRON  Take 2 tablets (8 mg total) by mouth 2 (two) times daily with a meal. Take daily starting the day after chemotherapy for 2 days. Take with food.     lidocaine-prilocaine cream  Commonly known as:  EMLA  Apply topically as needed.     megestrol 625 MG/5ML suspension  Commonly known as:  MEGACE ES  Take 5 mLs (625 mg total) by mouth daily.     metFORMIN 1000 MG tablet  Commonly known as:  GLUCOPHAGE  Take 1,000 mg by mouth 2 (two) times daily with a meal.     METHYLTESTOSTERONE PO  Take 1.25 mg by mouth daily.     MINIVELLE 0.0375 MG/24HR  Generic drug:  estradiol  Place 1 patch onto the skin 2 (two) times a week. Sundays and Thursdays     multivitamin tablet  Take 1 tablet by mouth daily. Senior     ondansetron 8 MG disintegrating tablet  Commonly known as:  ZOFRAN ODT  Take 1 tablet (8 mg total) by mouth every 8 (eight) hours as needed for nausea.     oxyCODONE-acetaminophen 5-325 MG per tablet  Commonly known as:  PERCOCET/ROXICET  Take 1-2 tablets by mouth every 4 (four) hours as needed.     polyethylene glycol packet  Commonly known as:  MIRALAX / GLYCOLAX  Take 17 g by mouth daily.     sitaGLIPtin 100 MG tablet  Commonly known as:  JANUVIA  Take 100 mg by mouth daily.     SOLUBLE FIBER/PROBIOTICS PO  Take 1 tablet by mouth daily.     TUMS E-X PO  Take 2 tablets by mouth daily.        Discharge Instructions:  The patient is to refrain from driving, heavy lifting or strenuous activity.  May shower daily and clean  incisions with soap and water.  May resume regular diet.   Follow Up:   Future Appointments Provider Department Dept Phone   01/02/2013 10:30 AM [facility] Nurse [facility] [place] [phone]   01/03/2013 10:15 AM [facility] [phone]   01/05/2013 2:00 PM [facility] Nurse [facility] [phone]   01/17/2013 1:45 PM Loreli Slot, MD [facility] [phone]      Follow-up Information   Follow up with HENDRICKSON,STEVEN C, MD In 3 weeks. (PA/LAT CXR to be taken (at [facility] which is in the same building as Dr. Sunday Corn office) on 01/17/2013 at 12:45 pm;Office appoinmtent with Dr. Dorris Fetch is on 01/17/2013 at 1:45 pm)    Specialty:  Cardiothoracic Surgery   Contact information:   [address] [employer] [address] [place] [address] [phone]       Follow up with [facility] [place] NURSE In 1 week. (RN will remove sutures in 1 week- office will schedule appointment)       Follow up with Drue Second, MD. (As directed)    Specialty:  Oncology   Contact information:   [address] [place] [address] [phone]        Elenore Rota 12/27/2012, 11:38 AM

## 2012-12-26 NOTE — Progress Notes (Addendum)
       [address]       [place] [address]             [phone]          5 Days Post-Op Procedure(s) (LRB): VIDEO ASSISTED THORACOSCOPY (Right) DRAINAGE OF PLEURAL EFFUSION (Right) DECORTICATION (Right)  Subjective: Stable, no complaints.  Appetite improving, pain controlled, small BM.  Walking in halls without difficulty.   Objective: Vital signs in last 24 hours: Patient Vitals for the past 24 hrs:  BP Temp Temp src Pulse Resp SpO2  12/26/12 0739 - - - - 13 97 %  12/26/12 0738 - - - - 15 97 %  12/26/12 0636 - - - 84 15 98 %  12/26/12 0432 156/90 mmHg 97.7 F (36.5 C) Oral 106 14 98 %  12/26/12 0235 - - - 111 - 99 %  12/25/12 2332 152/80 mmHg 97.9 F (36.6 C) Oral 95 14 97 %  12/25/12 2130 - - - 85 11 97 %  12/25/12 1940 135/74 mmHg 97.5 F (36.4 C) Oral 96 21 98 %  12/25/12 1653 - - - - 17 97 %  12/25/12 1622 - 98.6 F (37 C) Oral - - -  12/25/12 1259 - - - - 17 95 %  12/25/12 1221 120/68 mmHg 98.4 F (36.9 C) Oral 114 19 96 %  12/25/12 0800 - - - - 14 92 %  12/25/12 0756 139/68 mmHg - - 94 14 97 %   Current Weight  12/22/12 149 lb 0.5 oz (67.6 kg)     Intake/Output from previous day: 09/28 0701 - 09/29 0700 In: 686 [P.O.:240; I.V.:346; IV Piggyback:100] Out: 1175 [Urine:1150; Chest Tube:25]  CBGs [phone]    PHYSICAL EXAM:  Heart: RRR Lungs: Few coarse BS R base Wound: Clean and dry Chest tube: No air leak     CXR: Findings: Right IJ central venous catheter terminates at the  cavoatrial junction, unchanged. Axis left Port-A-Cath is stable.  A right-sided chest tube is in stable position.  Heart size is unchanged. The left lung remains clear.  Small pneumothorax about the entry site of the right chest tube is  unchanged. Surgical changes from prior lobectomy with pleural  thickening/effusion and associated atelectasis are similar. There  is slight interval improvement in aeration of the right lung base  as compared  to prior exam.  No acute osseous abnormality.  IMPRESSION:  1. Slight interval improvement in aeration of the right lung base  as compared to 12/25/2012. Otherwise similar appearance of right  pleural thickening/effusion with persistent basilar atelectasis.  2. Persistent trace loculated pleural air or intra point of the  chest tube.   Assessment/Plan: S/P Procedure(s) (LRB): VIDEO ASSISTED THORACOSCOPY (Right) DRAINAGE OF PLEURAL EFFUSION (Right) DECORTICATION (Right)  CT output scant, no air leak, CXR improving.  Hopefully can d/c remaining CT today.  Strep viridans empyema- remains on IV Zosyn.  Possibly switch to po abx soon.  DM- sugars elevated, continue home meds, SSI and watch. Pt reports she has been receiving high carb items for meals.  Continue ambulation, pulm toilet, etc. If CT removed, will d/c PCA, saline lock IVF.  Anticipate d/c home soon if she continues to progress.   LOS: 10 days    COLLINS,GINA H 12/26/2012  Patient seen and examined, agree with above Will dc CT Dc PCA Change to PO antibiotics- augmentin for strep viridans empyema, for 14 more days Home tomorrow if she continues to progress

## 2012-12-27 ENCOUNTER — Ambulatory Visit: Payer: 59 | Admitting: Oncology

## 2012-12-27 ENCOUNTER — Other Ambulatory Visit: Payer: 59 | Admitting: Lab

## 2012-12-27 ENCOUNTER — Inpatient Hospital Stay (HOSPITAL_COMMUNITY): Payer: 59

## 2012-12-27 LAB — GLUCOSE, CAPILLARY

## 2012-12-27 MED ORDER — HEPARIN SOD (PORK) LOCK FLUSH 100 UNIT/ML IV SOLN
500.0000 [IU] | INTRAVENOUS | Status: DC
  Filled 2012-12-27: qty 5

## 2012-12-27 MED ORDER — OXYCODONE-ACETAMINOPHEN 5-325 MG PO TABS: 1.0000 | ORAL_TABLET | ORAL | Status: DC | PRN

## 2012-12-27 MED ORDER — HEPARIN SOD (PORK) LOCK FLUSH 100 UNIT/ML IV SOLN
500.0000 [IU] | INTRAVENOUS | Status: DC | PRN
  Administered 2012-12-27: 500 [IU]
  Filled 2012-12-27: qty 5

## 2012-12-27 MED ORDER — AMOXICILLIN-POT CLAVULANATE 875-125 MG PO TABS: 1.0000 | ORAL_TABLET | Freq: Two times a day (BID) | ORAL | Status: DC

## 2012-12-27 NOTE — Progress Notes (Signed)
Patient's home med given to daughter and discharged home.

## 2012-12-27 NOTE — Progress Notes (Addendum)
Central line dc'd intact using hospital protocol.  Patient tolerated without event.

## 2012-12-27 NOTE — Progress Notes (Addendum)
      [address]       [place] [address]             [phone]       6 Days Post-Op Procedure(s) (LRB): VIDEO ASSISTED THORACOSCOPY (Right) DRAINAGE OF PLEURAL EFFUSION (Right) DECORTICATION (Right)  Subjective: Patient eating breakfast. She hopes to go home today.  Objective: Vital signs in last 24 hours: Temp:  [97.6 F (36.4 C)-98.8 F (37.1 C)] 98 F (36.7 C) (09/30 0420) Pulse Rate:  [88-111] 111 (09/30 0420) Cardiac Rhythm:  [-] Normal sinus rhythm (09/30 0800) Resp:  [11-21] 21 (09/30 0420) BP: (134-155)/(67-81) 155/81 mmHg (09/30 0420) SpO2:  [96 %-97 %] 96 % (09/30 0420)     Intake/Output from previous day: 09/29 0701 - 09/30 0700 In: 740 [P.O.:720; I.V.:20] Out: 1300 [Urine:1300]   Physical Exam:  Cardiovascular: Tachycardic Pulmonary: Clear to auscultation on the left; diminished on the right base; no rales, wheezes, or rhonchi. Abdomen: Soft, non tender, bowel sounds present. Extremities: Mild bilateral lower extremity edema. Wounds: Clean and dry.  No erythema or signs of infection.   Lab Results: CBC:No results found for this basename: WBC, HGB, HCT, PLT,  in the last 72 hours BMET: No results found for this basename: NA, K, CL, CO2, GLUCOSE, BUN, CREATININE, CALCIUM,  in the last 72 hours  PT/INR: No results found for this basename: LABPROT, INR,  in the last 72 hours ABG:  INR: Will add last result for INR, ABG once components are confirmed Will add last 4 CBG results once components are confirmed  Assessment/Plan:  1. CV - Tachycardic 2.  Pulmonary - CXR this am shows near resolution of previously seen right sided pneumothorax, right base atelectasis and small effusion, left lung clear. Will continue Augmentin for a total of 14 days for Strep Viridans. 3.DM-CBGs 209/205/202. On Tradjenta 5 daily, Metformin 1000 bid as taken pre op.  4.Remove central line 5.Likely discharge  ZIMMERMAN,DONIELLE MPA-C 12/27/2012,8:20  AM  Patient seen and examined, agree with above Dc today

## 2012-12-27 NOTE — Progress Notes (Signed)
PR-0.17, QRS-0.07, QT-0.32, QTc-0.39.

## 2012-12-27 NOTE — Progress Notes (Signed)
Discharge instructions given to patient and daughter with teach back.  All questions answered.  Will retreive home meds from pharmacy.  No complaints.

## 2012-12-27 NOTE — Care Management Note (Signed)
    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     Action/Plan:   From home.   Anticipated DC Date:  12/27/2012   Anticipated DC Plan:  HOME/SELF CARE      DC Planning Services  CM consult      Choice offered to / List presented to:             Status of service:  Completed, signed off Medicare Important Message given?  NA - LOS <3 / Initial given by admissions (If response is "NO", the following Medicare IM given date fields will be blank) Date Medicare IM given:   Date Additional Medicare IM given:    Discharge Disposition:  HOME/SELF CARE  Per UR Regulation:  Reviewed for med. necessity/level of care/duration of stay  If discussed at Long Length of Stay Meetings, dates discussed:   12/27/2012    Comments:  Contact:  Brown,[name] Daughter [phone]                 Lyla Son     [phone]                 Cleotis Nipper Friend     [phone]  12/26/12- 1600- Donn Pierini RN, BSN [phone] Last chest tube d/c'd plan for possible home in am  12-23-12  11:15pm Avie Arenas, RNBSN  [phone] Post op VATS - progressing well.  12/20/12 1355 Tymeeka Davis,RN,MSN [phone] Continued stay appropriate per MD Advisor. Pt followed by [facility] netwrok for Link to [employer].

## 2012-12-28 LAB — ANAEROBIC CULTURE

## 2012-12-30 ENCOUNTER — Other Ambulatory Visit: Payer: Self-pay | Admitting: *Deleted

## 2012-12-30 ENCOUNTER — Telehealth: Payer: Self-pay | Admitting: *Deleted

## 2012-12-30 DIAGNOSIS — J90 Pleural effusion, not elsewhere classified: Secondary | ICD-10-CM

## 2012-12-30 DIAGNOSIS — C20 Malignant neoplasm of rectum: Secondary | ICD-10-CM

## 2012-12-30 NOTE — Telephone Encounter (Signed)
Pt's daughter Anne [name] called with concerns about upcoming chemo on 10/7. Pt had recent hospitalization. No lab or MD appt on that day. Reviewed with MD Pt to be seen for MD visit on 10/7 @ 0815 with labs ( CBC/CMET) prior. Onc Tx schedule sent. Lab orders entered. Informed Anne [name] of this information, who verbalized understanding and will have pt here at 0800 on 10/7. No further concerns.

## 2013-01-02 ENCOUNTER — Ambulatory Visit (INDEPENDENT_AMBULATORY_CARE_PROVIDER_SITE_OTHER): Payer: 59 | Admitting: *Deleted

## 2013-01-02 DIAGNOSIS — J90 Pleural effusion, not elsewhere classified: Secondary | ICD-10-CM

## 2013-01-02 DIAGNOSIS — J869 Pyothorax without fistula: Secondary | ICD-10-CM

## 2013-01-02 DIAGNOSIS — C3490 Malignant neoplasm of unspecified part of unspecified bronchus or lung: Secondary | ICD-10-CM

## 2013-01-02 DIAGNOSIS — Z4802 Encounter for removal of sutures: Secondary | ICD-10-CM

## 2013-01-02 DIAGNOSIS — Z09 Encounter for follow-up examination after completed treatment for conditions other than malignant neoplasm: Secondary | ICD-10-CM

## 2013-01-02 NOTE — Progress Notes (Signed)
[name] returns for suture removal of two chest tube sites/VATS incisions after her Right Vats surgery.  These sites are still irritated and scaley but are healing.  Lung sounds are clear to auscultation but diminished in the  Left upper and lower bases.  She is still very weak but is getting better a little every day. Breathing easily.  She will return as scheduled with a chest xray.

## 2013-01-03 ENCOUNTER — Telehealth: Payer: Self-pay | Admitting: *Deleted

## 2013-01-03 ENCOUNTER — Other Ambulatory Visit: Payer: Self-pay | Admitting: Medical Oncology

## 2013-01-03 ENCOUNTER — Other Ambulatory Visit: Payer: Self-pay | Admitting: Emergency Medicine

## 2013-01-03 ENCOUNTER — Other Ambulatory Visit: Payer: 59 | Admitting: Lab

## 2013-01-03 ENCOUNTER — Ambulatory Visit (HOSPITAL_BASED_OUTPATIENT_CLINIC_OR_DEPARTMENT_OTHER): Payer: 59 | Admitting: Lab

## 2013-01-03 ENCOUNTER — Ambulatory Visit (HOSPITAL_COMMUNITY)
Admission: RE | Admit: 2013-01-03 | Discharge: 2013-01-03 | Disposition: A | Discharge status: Home / Self Care | Payer: 59 | Source: Ambulatory Visit | Attending: Oncology | Admitting: Oncology

## 2013-01-03 ENCOUNTER — Ambulatory Visit (HOSPITAL_BASED_OUTPATIENT_CLINIC_OR_DEPARTMENT_OTHER): Payer: 59

## 2013-01-03 ENCOUNTER — Ambulatory Visit (HOSPITAL_BASED_OUTPATIENT_CLINIC_OR_DEPARTMENT_OTHER): Payer: 59 | Admitting: Oncology

## 2013-01-03 VITALS — BP 133/69 | HR 88 | Temp 98.4°F | Resp 20

## 2013-01-03 VITALS — BP 134/77 | HR 121 | Temp 97.2°F | Resp 20 | Ht 64.0 in | Wt 129.3 lb

## 2013-01-03 DIAGNOSIS — C7800 Secondary malignant neoplasm of unspecified lung: Secondary | ICD-10-CM | POA: Insufficient documentation

## 2013-01-03 DIAGNOSIS — D630 Anemia in neoplastic disease: Secondary | ICD-10-CM

## 2013-01-03 DIAGNOSIS — C20 Malignant neoplasm of rectum: Secondary | ICD-10-CM | POA: Insufficient documentation

## 2013-01-03 DIAGNOSIS — D649 Anemia, unspecified: Secondary | ICD-10-CM

## 2013-01-03 LAB — COMPREHENSIVE METABOLIC PANEL (CC13)
AST: 14 U/L (ref 5–34)
Albumin: 2.5 g/dL — ABNORMAL LOW (ref 3.5–5.0)
Anion Gap: 10 mEq/L (ref 3–11)
BUN: 8.6 mg/dL (ref 7.0–26.0)
Calcium: 10.3 mg/dL (ref 8.4–10.4)
Chloride: 102 mEq/L (ref 98–109)
Glucose: 177 mg/dl — ABNORMAL HIGH (ref 70–140)
Potassium: 4.3 mEq/L (ref 3.5–5.1)
Sodium: 137 mEq/L (ref 136–145)
Total Protein: 7.1 g/dL (ref 6.4–8.3)

## 2013-01-03 LAB — CBC WITH DIFFERENTIAL/PLATELET
BASO%: 0.4 % (ref 0.0–2.0)
Basophils Absolute: 0.1 10*3/uL (ref 0.0–0.1)
Eosinophils Absolute: 0.1 10*3/uL (ref 0.0–0.5)
HCT: 28.4 % — ABNORMAL LOW (ref 34.8–46.6)
HGB: 8.9 g/dL — ABNORMAL LOW (ref 11.6–15.9)
LYMPH%: 13.7 % — ABNORMAL LOW (ref 14.0–49.7)
MCHC: 31.3 g/dL — ABNORMAL LOW (ref 31.5–36.0)
MONO#: 1 10*3/uL — ABNORMAL HIGH (ref 0.1–0.9)
NEUT#: 12.7 10*3/uL — ABNORMAL HIGH (ref 1.5–6.5)
NEUT%: 79.5 % — ABNORMAL HIGH (ref 38.4–76.8)
RBC: 3.8 10*6/uL (ref 3.70–5.45)
RDW: 21.3 % — ABNORMAL HIGH (ref 11.2–14.5)
WBC: 16 10*3/uL — ABNORMAL HIGH (ref 3.9–10.3)
lymph#: 2.2 10*3/uL (ref 0.9–3.3)

## 2013-01-03 LAB — IRON AND TIBC CHCC
%SAT: 7 % — ABNORMAL LOW (ref 21–57)
Iron: 21 ug/dL — ABNORMAL LOW (ref 41–142)

## 2013-01-03 LAB — FERRITIN CHCC: Ferritin: 158 ng/ml (ref 9–269)

## 2013-01-03 LAB — HOLD TUBE, BLOOD BANK

## 2013-01-03 LAB — PREPARE RBC (CROSSMATCH)

## 2013-01-03 MED ORDER — SODIUM CHLORIDE 0.9 % IV SOLN
250.0000 mL | Freq: Once | INTRAVENOUS | Status: AC
  Administered 2013-01-03: 250 mL via INTRAVENOUS

## 2013-01-03 MED ORDER — DIPHENHYDRAMINE HCL 25 MG PO CAPS
25.0000 mg | ORAL_CAPSULE | Freq: Once | ORAL | Status: AC
Start: 2013-01-03 — End: 2013-01-03
  Administered 2013-01-03: 25 mg via ORAL

## 2013-01-03 MED ORDER — SODIUM CHLORIDE 0.9 % IJ SOLN
10.0000 mL | INTRAMUSCULAR | Status: AC | PRN
  Administered 2013-01-03: 10 mL
  Filled 2013-01-03: qty 10

## 2013-01-03 MED ORDER — HEPARIN SOD (PORK) LOCK FLUSH 100 UNIT/ML IV SOLN
500.0000 [IU] | Freq: Every day | INTRAVENOUS | Status: AC | PRN
  Administered 2013-01-03: 500 [IU]
  Filled 2013-01-03: qty 5

## 2013-01-03 MED ORDER — ACETAMINOPHEN 325 MG PO TABS
650.0000 mg | ORAL_TABLET | Freq: Once | ORAL | Status: AC
  Administered 2013-01-03: 650 mg via ORAL

## 2013-01-03 MED ORDER — DIPHENHYDRAMINE HCL 25 MG PO CAPS
ORAL_CAPSULE | ORAL | Status: AC
  Filled 2013-01-03: qty 1

## 2013-01-03 MED ORDER — ACETAMINOPHEN 325 MG PO TABS
ORAL_TABLET | ORAL | Status: AC
  Filled 2013-01-03: qty 2

## 2013-01-03 NOTE — Patient Instructions (Addendum)
Proceed with blood transfusion today  CT chest   Return in 1 week for follow up

## 2013-01-03 NOTE — Patient Instructions (Addendum)
Blood Transfusion  A blood transfusion replaces your blood or some of its parts. Blood is replaced when you have lost blood because of surgery, an accident, or for severe blood conditions like anemia. You can donate blood to be used on yourself if you have a planned surgery. If you lose blood during that surgery, your own blood can be given back to you. Any blood given to you is checked to make sure it matches your blood type. Your temperature, blood pressure, and heart rate (vital signs) will be checked often.  GET HELP RIGHT AWAY IF:   You feel sick to your stomach (nauseous) or throw up (vomit).  You have watery poop (diarrhea).  You have shortness of breath or trouble breathing.  You have blood in your pee (urine) or have dark colored pee.  You have chest pain or tightness.  Your eyes or skin turn yellow (jaundice).  You have a temperature by mouth above 102 F (38.9 C), not controlled by medicine.  You start to shake and have chills.  You develop a a red rash (hives) or feel itchy.  You develop lightheadedness or feel confused.  You develop back, joint, or muscle pain.  You do not feel hungry (lost appetite).  You feel tired, restless, or nervous.  You develop belly (abdominal) cramps. Document Released: 06/12/2008 Document Revised: 06/08/2011 Document Reviewed: 06/12/2008 Exit[employer] Patient Information 2014 [employer].  

## 2013-01-03 NOTE — Telephone Encounter (Signed)
appts made and printed. Pt is awaere that cs will call her w/ appt for CT...td

## 2013-01-04 ENCOUNTER — Other Ambulatory Visit: Payer: Self-pay | Admitting: *Deleted

## 2013-01-04 DIAGNOSIS — G8918 Other acute postprocedural pain: Secondary | ICD-10-CM

## 2013-01-04 LAB — TYPE AND SCREEN
ABO/RH(D): O POS
Unit division: 0

## 2013-01-04 MED ORDER — OXYCODONE-ACETAMINOPHEN 5-325 MG PO TABS: 1.0000 | ORAL_TABLET | ORAL | Status: DC | PRN

## 2013-01-05 ENCOUNTER — Ambulatory Visit (HOSPITAL_COMMUNITY)
Admission: RE | Admit: 2013-01-05 | Discharge: 2013-01-05 | Disposition: A | Discharge status: Home / Self Care | Payer: 59 | Source: Ambulatory Visit | Attending: Oncology | Admitting: Oncology

## 2013-01-05 ENCOUNTER — Encounter (HOSPITAL_COMMUNITY): Payer: Self-pay

## 2013-01-05 DIAGNOSIS — D630 Anemia in neoplastic disease: Secondary | ICD-10-CM

## 2013-01-05 DIAGNOSIS — R918 Other nonspecific abnormal finding of lung field: Secondary | ICD-10-CM | POA: Insufficient documentation

## 2013-01-05 DIAGNOSIS — R222 Localized swelling, mass and lump, trunk: Secondary | ICD-10-CM | POA: Insufficient documentation

## 2013-01-05 DIAGNOSIS — M439 Deforming dorsopathy, unspecified: Secondary | ICD-10-CM | POA: Insufficient documentation

## 2013-01-05 DIAGNOSIS — I2510 Atherosclerotic heart disease of native coronary artery without angina pectoris: Secondary | ICD-10-CM | POA: Insufficient documentation

## 2013-01-05 DIAGNOSIS — C20 Malignant neoplasm of rectum: Secondary | ICD-10-CM | POA: Insufficient documentation

## 2013-01-05 DIAGNOSIS — J90 Pleural effusion, not elsewhere classified: Secondary | ICD-10-CM | POA: Insufficient documentation

## 2013-01-05 MED ORDER — IOHEXOL 300 MG/ML  SOLN
80.0000 mL | Freq: Once | INTRAMUSCULAR | Status: AC | PRN
  Administered 2013-01-05: 80 mL via INTRAVENOUS

## 2013-01-10 ENCOUNTER — Encounter: Payer: Self-pay | Admitting: Oncology

## 2013-01-10 ENCOUNTER — Telehealth: Payer: Self-pay | Admitting: *Deleted

## 2013-01-10 ENCOUNTER — Other Ambulatory Visit (HOSPITAL_BASED_OUTPATIENT_CLINIC_OR_DEPARTMENT_OTHER): Payer: 59 | Admitting: Lab

## 2013-01-10 ENCOUNTER — Ambulatory Visit (HOSPITAL_BASED_OUTPATIENT_CLINIC_OR_DEPARTMENT_OTHER): Payer: 59 | Admitting: Oncology

## 2013-01-10 ENCOUNTER — Telehealth: Payer: Self-pay | Admitting: Oncology

## 2013-01-10 VITALS — BP 144/76 | HR 99 | Temp 98.3°F | Resp 20 | Ht 64.0 in | Wt 131.7 lb

## 2013-01-10 DIAGNOSIS — C20 Malignant neoplasm of rectum: Secondary | ICD-10-CM

## 2013-01-10 DIAGNOSIS — C7800 Secondary malignant neoplasm of unspecified lung: Secondary | ICD-10-CM

## 2013-01-10 DIAGNOSIS — D649 Anemia, unspecified: Secondary | ICD-10-CM

## 2013-01-10 DIAGNOSIS — R918 Other nonspecific abnormal finding of lung field: Secondary | ICD-10-CM

## 2013-01-10 LAB — CBC WITH DIFFERENTIAL/PLATELET
BASO%: 1.3 % (ref 0.0–2.0)
Basophils Absolute: 0.1 10*3/uL (ref 0.0–0.1)
HCT: 33.8 % — ABNORMAL LOW (ref 34.8–46.6)
HGB: 11 g/dL — ABNORMAL LOW (ref 11.6–15.9)
MCHC: 32.6 g/dL (ref 31.5–36.0)
MONO#: 0.7 10*3/uL (ref 0.1–0.9)
NEUT%: 64.7 % (ref 38.4–76.8)
RDW: 22.6 % — ABNORMAL HIGH (ref 11.2–14.5)
WBC: 9.6 10*3/uL (ref 3.9–10.3)
lymph#: 2.3 10*3/uL (ref 0.9–3.3)

## 2013-01-10 LAB — COMPREHENSIVE METABOLIC PANEL (CC13)
ALT: 12 U/L (ref 0–55)
Albumin: 2.7 g/dL — ABNORMAL LOW (ref 3.5–5.0)
Anion Gap: 11 mEq/L (ref 3–11)
BUN: 8.7 mg/dL (ref 7.0–26.0)
CO2: 25 mEq/L (ref 22–29)
Calcium: 9.8 mg/dL (ref 8.4–10.4)
Chloride: 102 mEq/L (ref 98–109)
Creatinine: 0.6 mg/dL (ref 0.6–1.1)
Potassium: 3.9 mEq/L (ref 3.5–5.1)
Total Protein: 6.7 g/dL (ref 6.4–8.3)

## 2013-01-10 NOTE — Patient Instructions (Signed)
Continue recovery and we will plan on resuming chemotherapy on 10/21  We will see you back in 1 week for follow up and chemotherapy

## 2013-01-10 NOTE — Telephone Encounter (Signed)
Pe staff message and POF I have schedueld appts. I have moved appt from 10/21 to 10/22 sue to no openings

## 2013-01-10 NOTE — Progress Notes (Signed)
OFFICE PROGRESS NOTE  CC  [name], MD [address] [place] [address]  DIAGNOSIS: 67 year old female with new diagnosis of rectal cancer with lung metastasis   PRIOR THERAPY: #17/27/2014 with acute episode of bright red blood per rectum and large clot formation, prompting her to be evaluated at the ED. Of note,colonoscopy prior to this admission had been performed on 03/04/10(screening) which showed three polyps ,largest 8 mm, removed, as well as moderate diverticulosis in the sigmoid colon, otherwise normal exam . On presentation, she had Positive Guaiac and a rectal mass had been palpated on exam. On 7/29 she underwent colonoscopy (Dr. Marina Goodell) which revealed a bulky, circumferential, friable mass lesion in the rectum of about 4 cm above the dentate line, subtotally obstructing and would not permit passage of the colonoscope proximally. Multiple biopsies were taken.  #2 patient's pathology revealed consistent with adenocarcinoma with primary from rectal. Lung biopsy was performed also that revealed metastatic disease.  #3 patient was seen by Dr. Dorris Fetch for possible resection of the lung metastasis. He recommended we proceed with chemotherapy first to see if we could do both lung metastases and with eventual resection.  #4 patient is recommended to proceed with palliative chemotherapy consisting of FOLFOX. We will hold Avastin every 2 rectal bleeding.    CURRENT THERAPY: Status post cycle 2  of modified FOLFOX 6  INTERVAL HISTORY: Anne [name] 67 y.o. female returns for followup visit . Patient returns for weekly followup visit today. She seems to be doing much better. She is stronger and eating better. She is slowly regaining her strength after her surgery that was performed 2 weeks ago. She is walking without any shortness of breath. She is trying to gain weight. Her blood counts have significantly improved. The patient did require blood transfusions last  week. She is denying any nausea vomiting she has no fevers chills no cough no hemoptysis or hematemesis. Patient does have irritable bowel with sometimes constipation and sometimes diarrhea. She has not noticed any blood in her stool. Remainder of the 10 point review of systems is negative.   MEDICAL HISTORY: Past Medical History  Diagnosis Date  . Heart murmur, systolic 07/01/2011  . Postmenopausal HRT (hormone replacement therapy) 07/01/2011    surgical menopause  . Hx of hyperglycemia 07/01/2011  . Hx of compression fracture of spine 1985    t12 after a  20 foot fall  . Hx of fracture of wrist     after fall   . 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 Hx of colonic polyps   . Early cataract     stoneburner   . High frequency hearing loss     [facility] followed   . Diabetes mellitus without complication   . Cancer     colon  . Skin cancer   . Complication of anesthesia     pt hadpanic attacks with versed, no versed    ALLERGIES:  is allergic to ativan; midazolam; zolpidem tartrate; and neomycin-bacitracin zn-polymyx.  MEDICATIONS:  Current Outpatient Prescriptions  Medication Sig Dispense Refill  . Calcium Carbonate Antacid (TUMS E-X PO) Take 2 tablets by mouth daily.      . Calcium Carbonate-Vitamin D (CALTRATE 600+D PO) Take 1 tablet by mouth daily.       Marland Kitchen estradiol (MINIVELLE) 0.0375 MG/24HR Place 1 patch onto the skin 2 (two) times a week. Sundays and Thursdays      . megestrol (MEGACE ES) 625 MG/5ML suspension  Take 5 mLs (625 mg total) by mouth daily.  150 mL  7  . metFORMIN (GLUCOPHAGE) 1000 MG tablet Take 1,000 mg by mouth 2 (two) times daily with a meal.      . METHYLTESTOSTERONE PO Take 1.25 mg by mouth daily.      . Multiple Vitamin (MULTIVITAMIN) tablet Take 1 tablet by mouth daily. Senior      . oxyCODONE-acetaminophen (PERCOCET/ROXICET) 5-325 MG per tablet Take 1-2 tablets by mouth every 4 (four) hours as needed.  40 tablet  0  . polyethylene glycol (MIRALAX /  GLYCOLAX) packet Take 17 g by mouth daily.  100 each  0  . Probiotic Product (SOLUBLE FIBER/PROBIOTICS PO) Take 1 tablet by mouth daily.      . sitaGLIPtin (JANUVIA) 100 MG tablet Take 100 mg by mouth daily.      Marland Kitchen dexamethasone (DECADRON) 4 MG tablet Take 2 tablets (8 mg total) by mouth 2 (two) times daily with a meal. Take daily starting the day after chemotherapy for 2 days. Take with food.  30 tablet  1  . lidocaine-prilocaine (EMLA) cream Apply topically as needed.  30 g  5  . ondansetron (ZOFRAN ODT) 8 MG disintegrating tablet Take 1 tablet (8 mg total) by mouth every 8 (eight) hours as needed for nausea.  30 tablet  2   No current facility-administered medications for this visit.    SURGICAL HISTORY:  Past Surgical History  Procedure Laterality Date  . Tonsillectomy  1952  . Adenoidectomy  1952  . Wrist surgery  1957    left reduction  . Compression fraction  1985    T12  . Anterior cruciate ligament repair  1993    right Murphy  . Cosmetic surgery  1999-2007    holderness blepharoplasty facial rhytidectomy mastopexy abdominoplasty   . Dental implant    . Dilation and curettage of uterus  1977  . Total abdominal hysterectomy  2005    with cystocele repair   . Colonoscopy N/A 10/24/2012    Procedure: COLONOSCOPY;  Surgeon: Hilarie Fredrickson, MD;  Location: [facility] ENDOSCOPY;  Service: Endoscopy;  Laterality: N/A;  . Colonoscopy N/A 10/25/2012    Procedure: COLONOSCOPY;  Surgeon: Hilarie Fredrickson, MD;  Location: [facility] ENDOSCOPY;  Service: Endoscopy;  Laterality: N/A;  . Video bronchoscopy Bilateral 10/27/2012    Procedure: VIDEO BRONCHOSCOPY WITH FLUORO;  Surgeon: Coralyn Helling, MD;  Location: [facility] ENDOSCOPY;  Service: Endoscopy;  Laterality: Bilateral;  . Portacath placement Left 11/21/2012    Procedure: INSERTION PORT-A-CATH;  Surgeon: Loreli Slot, MD;  Location: [facility] OR;  Service: Thoracic;  Laterality: Left;  . Video assisted thoracoscopy Right 12/21/2012    Procedure: VIDEO ASSISTED  THORACOSCOPY;  Surgeon: Loreli Slot, MD;  Location: [facility] OR;  Service: Thoracic;  Laterality: Right;  . Pleural effusion drainage Right 12/21/2012    Procedure: DRAINAGE OF PLEURAL EFFUSION;  Surgeon: Loreli Slot, MD;  Location: [facility] OR;  Service: Thoracic;  Laterality: Right;  . Decortication Right 12/21/2012    Procedure: DECORTICATION;  Surgeon: Loreli Slot, MD;  Location[facility] OR;  Service: Thoracic;  Laterality: Right;    REVIEW OF SYSTEMS:  Pertinent items are noted in HPI.   HEALTH MAINTENANCE:   PHYSICAL EXAMINATION: Blood pressure 144/76, pulse 99, temperature 98.3 F (36.8 C), temperature source Oral, resp. rate 20, height 5\' 4"  (1.626 m), weight 131 lb 11.2 oz (59.739 kg). Body mass index is 22.6 kg/(m^2). General: Patient is a well appearing female  in no acute distress HEENT: PERRLA, sclerae anicteric no conjunctival pallor, MMM Neck: supple, no palpable adenopathy Lungs: clear to auscultation bilaterally, no wheezes, rhonchi, or rales Cardiovascular: regular rate rhythm, S1, S2, no murmurs, rubs or gallops Abdomen: Soft, non-tender, non-distended, normoactive bowel sounds, no HSM Extremities: warm and well perfused, no clubbing, cyanosis, or edema Skin: No rashes or lesions Neuro: Non-focal ECOG PERFORMANCE STATUS: 0 - Asymptomatic  LABORATORY DATA: Lab Results  Component Value Date   WBC 9.6 01/10/2013   HGB 11.0* 01/10/2013   HCT 33.8* 01/10/2013   MCV 78.0* 01/10/2013   PLT 571* 01/10/2013      Chemistry      Component Value Date/Time   NA 137 01/03/2013 0836   NA 131* 12/23/2012 0343   K 4.3 01/03/2013 0836   K 4.5 12/23/2012 0343   CL 95* 12/23/2012 0343   CO2 25 01/03/2013 0836   CO2 28 12/23/2012 0343   BUN 8.6 01/03/2013 0836   BUN 8 12/23/2012 0343   CREATININE 0.6 01/03/2013 0836   CREATININE 0.29* 12/23/2012 0343      Component Value Date/Time   CALCIUM 10.3 01/03/2013 0836   CALCIUM 8.6 12/23/2012 0343   ALKPHOS 97 01/03/2013 0836    ALKPHOS 121* 12/23/2012 0343   AST 14 01/03/2013 0836   AST 40* 12/23/2012 0343   ALT 12 01/03/2013 0836   ALT 22 12/23/2012 0343   BILITOT 0.26 01/03/2013 0836   BILITOT 0.1* 12/23/2012 0343       RADIOGRAPHIC STUDIES:  Dg Chest 2 View Within Previous 72 Hours.  Films Obtained On Friday Are Acceptable For Monday And Tuesday Cases  11/18/2012   CLINICAL DATA:  67 year old female preoperative study. Lung mass. Colon cancer.  EXAM: CHEST  2 VIEW  COMPARISON:  PET-CT 11/02/2012 and earlier.  FINDINGS: 5 cm right lower lobe lung mass re- identified. Radiographically this appears not significantly changed since 10/25/2012. The 6 mm cavitary right upper lobe pulmonary nodule is faint but visible. Stable lung volumes. Stable cardiac size and mediastinal contours. The left lung is clear. No pneumothorax or effusion. Stable visualized osseous structures. Chronic T12 compression fracture. Flowing thoracic osteophytes.  IMPRESSION: Known right lung lesions. No new cardiopulmonary abnormality.   Electronically Signed   By: Augusto Gamble   On: 11/18/2012 15:53   Nm Pet Image Initial (pi) Skull Base To Thigh  11/02/2012   *RADIOLOGY REPORT*  Clinical Data:  Initial treatment strategy for rectal cancer. Circumferential rectal cancer 4 cm from the anal verge.  4 cm right lung mass.  NUCLEAR MEDICINE PET WHOLE BODY  Fasting Blood Glucose:  123  Technique:  19.1 mCi F-18 FDG was injected intravenously. CT data was obtained and used for attenuation correction and anatomic localization only.  (This was not acquired as a diagnostic CT examination.) Additional exam technical data entered on technologist worksheet.  Comparison:  CT scan from 10/25/2012  Findings:  Head/Neck:   No hypermetabolic lymph nodes in the neck.  Chest:  The previously characterized 5 cm right lower lobe pulmonary mass is hypermetabolic with SUV max = 8.  No evidence for hypermetabolic lymphadenopathy in the mediastinum or either hilum. No other hypermetabolic  lung lesions are evident although the patient does have bilateral small pulmonary parenchymal nodules (see posterior right upper lobe on image 73 and posterior right lower lobe on image 78).  Both of these small nodules are below the accepted size threshold for reliable resolution on PET imaging.  Abdomen/Pelvis:  No abnormal  hypermetabolism is identified within the liver.  No hypermetabolic lymphadenopathy in the abdomen.  There is a prominent amount of physiologic F D G uptake in the colon, but this is associated with hypermetabolic uptake in a region of circumferential rectal wall thickening.  SUV max = 9 for the rectal hypermetabolism.  Skeleton:  No focal hypermetabolic activity to suggest skeletal metastasis.  IMPRESSION: Hypermetabolism in the area of apparent circumferential rectal wall thickening associate with hypermetabolic 5 cm right lower lobe pulmonary mass.  6 mm cavitary pulmonary nodule the posterior right upper lobe is not hypermetabolic on PET imaging, but is below the accepted size threshold for reliable resolution on PET.  A second tiny right lower lobe nodule is also noted.   Original Report Authenticated By: Kennith Center, M.D.   Dg Fluoro Guide Cv Line-no Report  11/21/2012   CLINICAL DATA: Done in OR # 10,Intraoperative insertion port a cath   FLOURO GUIDE CV LINE  Fluoroscopy was utilized by the requesting physician.  No radiographic  interpretation.     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 She is in oligo metastases patient is very interested in being aggressive with her disease.She will receive a total of 6 cycles of FOLFOX 6. This will then be followed by radiation with radiosensitizing Xeloda. She will then finish up another 6 cycles of FOLFOX 6.  #2 patient's course was complicated after 2 cycles with hospitalization for pneumonia/loculated pleural effusion she did require VATs procedure with drainage of the pleural  effusion. Her cultures did reveal strep. She has been on antibiotics. She is completed up and 10 now. She seems to be doing much better.  #3 patient has an appointment set up to be seen by a surgeon in [place]. She will also need an appointment set up to be seen by Dr. Margaretmary Bayley here as well as Dr. Helmut Muster comments.   PLAN:  #1 patient will continue to be observed for now however she will return in one week's time for cycle 3 of FOLFOX.  #2 I will refer her to Dr. Kathrynn Running as well as to Dr. Romie Levee  #3 she knows to call me with any problems questions or concerns.  All questions were answered. The patient knows to call the clinic with any problems, questions or concerns. We can certainly see the patient much sooner if necessary.  I spent 25 minutes counseling the patient face to face. The total time spent in the appointment was 30 minutes.  Drue Second, MD Medical/Oncology [facility] [phone] (beeper) [phone] (Office)

## 2013-01-11 ENCOUNTER — Telehealth: Payer: Self-pay | Admitting: Oncology

## 2013-01-11 ENCOUNTER — Telehealth (INDEPENDENT_AMBULATORY_CARE_PROVIDER_SITE_OTHER): Payer: Self-pay | Admitting: General Surgery

## 2013-01-11 NOTE — Telephone Encounter (Signed)
Left family message for pt to check my chart.

## 2013-01-12 ENCOUNTER — Telehealth: Payer: Self-pay | Admitting: Oncology

## 2013-01-12 NOTE — Telephone Encounter (Signed)
, °

## 2013-01-17 ENCOUNTER — Ambulatory Visit: Payer: 59 | Admitting: Thoracic Surgery (Cardiothoracic Vascular Surgery)

## 2013-01-17 ENCOUNTER — Encounter (HOSPITAL_COMMUNITY): Payer: Self-pay

## 2013-01-17 ENCOUNTER — Other Ambulatory Visit (HOSPITAL_BASED_OUTPATIENT_CLINIC_OR_DEPARTMENT_OTHER): Payer: 59 | Admitting: Lab

## 2013-01-17 ENCOUNTER — Ambulatory Visit (HOSPITAL_COMMUNITY)
Admission: RE | Admit: 2013-01-17 | Discharge: 2013-01-17 | Disposition: A | Discharge status: Home / Self Care | Payer: 59 | Source: Ambulatory Visit | Attending: Thoracic Surgery (Cardiothoracic Vascular Surgery) | Admitting: Thoracic Surgery (Cardiothoracic Vascular Surgery)

## 2013-01-17 ENCOUNTER — Ambulatory Visit (HOSPITAL_BASED_OUTPATIENT_CLINIC_OR_DEPARTMENT_OTHER): Payer: 59 | Admitting: Oncology

## 2013-01-17 ENCOUNTER — Ambulatory Visit (HOSPITAL_COMMUNITY)
Admission: RE | Admit: 2013-01-17 | Discharge: 2013-01-17 | Disposition: A | Discharge status: Home / Self Care | Payer: 59 | Source: Ambulatory Visit | Attending: Oncology | Admitting: Oncology

## 2013-01-17 ENCOUNTER — Telehealth: Payer: Self-pay | Admitting: Oncology

## 2013-01-17 VITALS — BP 135/81 | HR 101 | Temp 98.1°F | Resp 18 | Ht 64.0 in | Wt 126.4 lb

## 2013-01-17 DIAGNOSIS — K5730 Diverticulosis of large intestine without perforation or abscess without bleeding: Secondary | ICD-10-CM | POA: Insufficient documentation

## 2013-01-17 DIAGNOSIS — R141 Gas pain: Secondary | ICD-10-CM | POA: Insufficient documentation

## 2013-01-17 DIAGNOSIS — M47814 Spondylosis without myelopathy or radiculopathy, thoracic region: Secondary | ICD-10-CM | POA: Insufficient documentation

## 2013-01-17 DIAGNOSIS — C20 Malignant neoplasm of rectum: Secondary | ICD-10-CM

## 2013-01-17 DIAGNOSIS — G8918 Other acute postprocedural pain: Secondary | ICD-10-CM

## 2013-01-17 DIAGNOSIS — J90 Pleural effusion, not elsewhere classified: Secondary | ICD-10-CM | POA: Insufficient documentation

## 2013-01-17 DIAGNOSIS — R109 Unspecified abdominal pain: Secondary | ICD-10-CM | POA: Insufficient documentation

## 2013-01-17 DIAGNOSIS — K6389 Other specified diseases of intestine: Secondary | ICD-10-CM | POA: Insufficient documentation

## 2013-01-17 DIAGNOSIS — N200 Calculus of kidney: Secondary | ICD-10-CM | POA: Insufficient documentation

## 2013-01-17 DIAGNOSIS — K6289 Other specified diseases of anus and rectum: Secondary | ICD-10-CM | POA: Insufficient documentation

## 2013-01-17 DIAGNOSIS — C7800 Secondary malignant neoplasm of unspecified lung: Secondary | ICD-10-CM

## 2013-01-17 DIAGNOSIS — C189 Malignant neoplasm of colon, unspecified: Secondary | ICD-10-CM

## 2013-01-17 DIAGNOSIS — R142 Eructation: Secondary | ICD-10-CM | POA: Insufficient documentation

## 2013-01-17 DIAGNOSIS — R222 Localized swelling, mass and lump, trunk: Secondary | ICD-10-CM | POA: Insufficient documentation

## 2013-01-17 LAB — COMPREHENSIVE METABOLIC PANEL (CC13)
ALT: 12 U/L (ref 0–55)
AST: 11 U/L (ref 5–34)
Albumin: 2.8 g/dL — ABNORMAL LOW (ref 3.5–5.0)
Alkaline Phosphatase: 83 U/L (ref 40–150)
Anion Gap: 14 mEq/L — ABNORMAL HIGH (ref 3–11)
Calcium: 10.5 mg/dL — ABNORMAL HIGH (ref 8.4–10.4)
Chloride: 101 mEq/L (ref 98–109)
Glucose: 179 mg/dl — ABNORMAL HIGH (ref 70–140)
Potassium: 3.6 mEq/L (ref 3.5–5.1)
Sodium: 136 mEq/L (ref 136–145)

## 2013-01-17 LAB — CBC WITH DIFFERENTIAL/PLATELET
BASO%: 0.9 % (ref 0.0–2.0)
EOS%: 1.3 % (ref 0.0–7.0)
HGB: 11.5 g/dL — ABNORMAL LOW (ref 11.6–15.9)
MCH: 25.2 pg (ref 25.1–34.0)
MCHC: 32.5 g/dL (ref 31.5–36.0)
MONO#: 1 10*3/uL — ABNORMAL HIGH (ref 0.1–0.9)
MONO%: 7.2 % (ref 0.0–14.0)
RBC: 4.54 10*6/uL (ref 3.70–5.45)
RDW: 22.1 % — ABNORMAL HIGH (ref 11.2–14.5)
WBC: 13.9 10*3/uL — ABNORMAL HIGH (ref 3.9–10.3)
lymph#: 2.4 10*3/uL (ref 0.9–3.3)

## 2013-01-17 MED ORDER — OXYCODONE-ACETAMINOPHEN 5-325 MG PO TABS: 1.0000 | ORAL_TABLET | ORAL | Status: DC | PRN

## 2013-01-17 MED ORDER — IOHEXOL 300 MG/ML  SOLN
100.0000 mL | Freq: Once | INTRAMUSCULAR | Status: AC | PRN
  Administered 2013-01-17: 100 mL via INTRAVENOUS

## 2013-01-17 NOTE — Progress Notes (Signed)
OFFICE PROGRESS NOTE  CC  [name], MD [address] [place] [address]  DIAGNOSIS: 67 year old female with new diagnosis of rectal cancer with lung metastasis   PRIOR THERAPY: #17/27/2014 with acute episode of bright red blood per rectum and large clot formation, prompting her to be evaluated at the ED. Of note,colonoscopy prior to this admission had been performed on 03/04/10(screening) which showed three polyps ,largest 8 mm, removed, as well as moderate diverticulosis in the sigmoid colon, otherwise normal exam . On presentation, she had Positive Guaiac and a rectal mass had been palpated on exam. On 7/29 she underwent colonoscopy (Dr. Marina Goodell) which revealed a bulky, circumferential, friable mass lesion in the rectum of about 4 cm above the dentate line, subtotally obstructing and would not permit passage of the colonoscope proximally. Multiple biopsies were taken.  #2 patient's pathology revealed consistent with adenocarcinoma with primary from rectal. Lung biopsy was performed also that revealed metastatic disease.  #3 patient was seen by Dr. Dorris Fetch for possible resection of the lung metastasis. He recommended we proceed with chemotherapy first to see if we could do both lung metastases and with eventual resection.  #4 patient is recommended to proceed with palliative chemotherapy consisting of FOLFOX. We will hold Avastin every 2 rectal bleeding.  #5 patient was recently discharged from the hospital after having a past procedure for loculated pleural effusions. She did develop strep infection that was contained. Postoperatively she is now seen in medical oncology    CURRENT THERAPY:  INTERVAL HISTORY: Anne [name] 67 y.o. female returns for followup visit. She still seems to be deconditioned from her recent hospitalization. However her cough and shortness of breath have significantly improved. She is having somewhat normal bowel movements although she  does complain of some constipation at times alternating with possible diarrhea. She is not eating as well but she does tell me that her appetite is increasing slowly. She is trying to do physical therapy at home and to get stronger. She has no nausea or vomiting she has not had any fevers chills or night sweats. Remainder of the 10 point review of systems is negative.  MEDICAL HISTORY: Past Medical History  Diagnosis Date  . Heart murmur, systolic 07/01/2011  . Postmenopausal HRT (hormone replacement therapy) 07/01/2011    surgical menopause  . Hx of hyperglycemia 07/01/2011  . Hx of compression fracture of spine 1985    t12 after a  20 foot fall  . Hx of fracture of wrist     after fall   . 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 Hx of colonic polyps   . Early cataract     stoneburner   . High frequency hearing loss     [facility] followed   . Diabetes mellitus without complication   . Cancer     colon  . Skin cancer   . Complication of anesthesia     pt hadpanic attacks with versed, no versed    ALLERGIES:  is allergic to ativan; midazolam; zolpidem tartrate; and neomycin-bacitracin zn-polymyx.  MEDICATIONS:  Current Outpatient Prescriptions  Medication Sig Dispense Refill  . Calcium Carbonate Antacid (TUMS E-X PO) Take 2 tablets by mouth daily.      . Calcium Carbonate-Vitamin D (CALTRATE 600+D PO) Take 1 tablet by mouth daily.       Marland Kitchen estradiol (MINIVELLE) 0.0375 MG/24HR Place 1 patch onto the skin 2 (two) times a week. Sundays and Thursdays      .  lidocaine-prilocaine (EMLA) cream Apply topically as needed.  30 g  5  . megestrol (MEGACE ES) 625 MG/5ML suspension Take 5 mLs (625 mg total) by mouth daily.  150 mL  7  . metFORMIN (GLUCOPHAGE) 1000 MG tablet Take 1,000 mg by mouth 2 (two) times daily with a meal.      . METHYLTESTOSTERONE PO Take 1.25 mg by mouth daily.      . Multiple Vitamin (MULTIVITAMIN) tablet Take 1 tablet by mouth daily. Senior      . Probiotic Product  (SOLUBLE FIBER/PROBIOTICS PO) Take 1 tablet by mouth daily.      . sitaGLIPtin (JANUVIA) 100 MG tablet Take 100 mg by mouth daily.      Marland Kitchen dexamethasone (DECADRON) 4 MG tablet Take 2 tablets (8 mg total) by mouth 2 (two) times daily with a meal. Take daily starting the day after chemotherapy for 2 days. Take with food.  30 tablet  1  . ondansetron (ZOFRAN ODT) 8 MG disintegrating tablet Take 1 tablet (8 mg total) by mouth every 8 (eight) hours as needed for nausea.  30 tablet  2  . oxyCODONE-acetaminophen (PERCOCET/ROXICET) 5-325 MG per tablet Take 1-2 tablets by mouth every 4 (four) hours as needed.  80 tablet  0  . polyethylene glycol (MIRALAX / GLYCOLAX) packet Take 17 g by mouth daily.  100 each  0   No current facility-administered medications for this visit.    SURGICAL HISTORY:  Past Surgical History  Procedure Laterality Date  . Tonsillectomy  1952  . Adenoidectomy  1952  . Wrist surgery  1957    left reduction  . Compression fraction  1985    T12  . Anterior cruciate ligament repair  1993    right Murphy  . Cosmetic surgery  1999-2007    holderness blepharoplasty facial rhytidectomy mastopexy abdominoplasty   . Dental implant    . Dilation and curettage of uterus  1977  . Total abdominal hysterectomy  2005    with cystocele repair   . Colonoscopy N/A 10/24/2012    Procedure: COLONOSCOPY;  Surgeon: Hilarie Fredrickson, MD;  Location: [facility] ENDOSCOPY;  Service: Endoscopy;  Laterality: N/A;  . Colonoscopy N/A 10/25/2012    Procedure: COLONOSCOPY;  Surgeon: Hilarie Fredrickson, MD;  Location: [facility] ENDOSCOPY;  Service: Endoscopy;  Laterality: N/A;  . Video bronchoscopy Bilateral 10/27/2012    Procedure: VIDEO BRONCHOSCOPY WITH FLUORO;  Surgeon: Coralyn Helling, MD;  Location: [facility] ENDOSCOPY;  Service: Endoscopy;  Laterality: Bilateral;  . Portacath placement Left 11/21/2012    Procedure: INSERTION PORT-A-CATH;  Surgeon: Loreli Slot, MD;  Location: [facility] OR;  Service: Thoracic;  Laterality: Left;  .  Video assisted thoracoscopy Right 12/21/2012    Procedure: VIDEO ASSISTED THORACOSCOPY;  Surgeon: Loreli Slot, MD;  Location: [facility] OR;  Service: Thoracic;  Laterality: Right;  . Pleural effusion drainage Right 12/21/2012    Procedure: DRAINAGE OF PLEURAL EFFUSION;  Surgeon: Loreli Slot, MD;  Location: [facility] OR;  Service: Thoracic;  Laterality: Right;  . Decortication Right 12/21/2012    Procedure: DECORTICATION;  Surgeon: Loreli Slot, MD;  Location: [facility] OR;  Service: Thoracic;  Laterality: Right;    REVIEW OF SYSTEMS:  Pertinent items are noted in HPI.   HEALTH MAINTENANCE:   PHYSICAL EXAMINATION: Blood pressure 134/77, pulse 121, temperature 97.2 F (36.2 C), temperature source Oral, resp. rate 20, height 5\' 4"  (1.626 m), weight 129 lb 4.8 oz (58.65 kg). Body mass index is 22.18 kg/(m^2). General:  Patient is a well appearing female in no acute distress HEENT: PERRLA, sclerae anicteric no conjunctival pallor, MMM Neck: supple, no palpable adenopathy Lungs: clear to auscultation bilaterally, no wheezes, rhonchi, or rales Cardiovascular: regular rate rhythm, S1, S2, no murmurs, rubs or gallops Abdomen: Soft, non-tender, non-distended, normoactive bowel sounds, no HSM Extremities: warm and well perfused, no clubbing, cyanosis, or edema Skin: No rashes or lesions Neuro: Non-focal ECOG PERFORMANCE STATUS: 0 - Asymptomatic  LABORATORY DATA: Lab Results  Component Value Date   WBC 13.9* 01/17/2013   HGB 11.5* 01/17/2013   HCT 35.2 01/17/2013   MCV 77.5* 01/17/2013   PLT 431* 01/17/2013      Chemistry      Component Value Date/Time   NA 136 01/17/2013 0817   NA 131* 12/23/2012 0343   K 3.6 01/17/2013 0817   K 4.5 12/23/2012 0343   CL 95* 12/23/2012 0343   CO2 21* 01/17/2013 0817   CO2 28 12/23/2012 0343   BUN 7.1 01/17/2013 0817   BUN 8 12/23/2012 0343   CREATININE 0.7 01/17/2013 0817   CREATININE 0.29* 12/23/2012 0343      Component Value Date/Time   CALCIUM  10.5* 01/17/2013 0817   CALCIUM 8.6 12/23/2012 0343   ALKPHOS 83 01/17/2013 0817   ALKPHOS 121* 12/23/2012 0343   AST 11 01/17/2013 0817   AST 40* 12/23/2012 0343   ALT 12 01/17/2013 0817   ALT 22 12/23/2012 0343   BILITOT 0.30 01/17/2013 0817   BILITOT 0.1* 12/23/2012 0343       RADIOGRAPHIC STUDIES:  Dg Chest 2 View Within Previous 72 Hours.  Films Obtained On Friday Are Acceptable For Monday And Tuesday Cases  11/18/2012   CLINICAL DATA:  67 year old female preoperative study. Lung mass. Colon cancer.  EXAM: CHEST  2 VIEW  COMPARISON:  PET-CT 11/02/2012 and earlier.  FINDINGS: 5 cm right lower lobe lung mass re- identified. Radiographically this appears not significantly changed since 10/25/2012. The 6 mm cavitary right upper lobe pulmonary nodule is faint but visible. Stable lung volumes. Stable cardiac size and mediastinal contours. The left lung is clear. No pneumothorax or effusion. Stable visualized osseous structures. Chronic T12 compression fracture. Flowing thoracic osteophytes.  IMPRESSION: Known right lung lesions. No new cardiopulmonary abnormality.   Electronically Signed   By: Augusto Gamble   On: 11/18/2012 15:53   Nm Pet Image Initial (pi) Skull Base To Thigh  11/02/2012   *RADIOLOGY REPORT*  Clinical Data:  Initial treatment strategy for rectal cancer. Circumferential rectal cancer 4 cm from the anal verge.  4 cm right lung mass.  NUCLEAR MEDICINE PET WHOLE BODY  Fasting Blood Glucose:  123  Technique:  19.1 mCi F-18 FDG was injected intravenously. CT data was obtained and used for attenuation correction and anatomic localization only.  (This was not acquired as a diagnostic CT examination.) Additional exam technical data entered on technologist worksheet.  Comparison:  CT scan from 10/25/2012  Findings:  Head/Neck:   No hypermetabolic lymph nodes in the neck.  Chest:  The previously characterized 5 cm right lower lobe pulmonary mass is hypermetabolic with SUV max = 8.  No evidence for  hypermetabolic lymphadenopathy in the mediastinum or either hilum. No other hypermetabolic lung lesions are evident although the patient does have bilateral small pulmonary parenchymal nodules (see posterior right upper lobe on image 73 and posterior right lower lobe on image 78).  Both of these small nodules are below the accepted size threshold for reliable resolution on PET  imaging.  Abdomen/Pelvis:  No abnormal hypermetabolism is identified within the liver.  No hypermetabolic lymphadenopathy in the abdomen.  There is a prominent amount of physiologic F D G uptake in the colon, but this is associated with hypermetabolic uptake in a region of circumferential rectal wall thickening.  SUV max = 9 for the rectal hypermetabolism.  Skeleton:  No focal hypermetabolic activity to suggest skeletal metastasis.  IMPRESSION: Hypermetabolism in the area of apparent circumferential rectal wall thickening associate with hypermetabolic 5 cm right lower lobe pulmonary mass.  6 mm cavitary pulmonary nodule the posterior right upper lobe is not hypermetabolic on PET imaging, but is below the accepted size threshold for reliable resolution on PET.  A second tiny right lower lobe nodule is also noted.   Original Report Authenticated By: Kennith Center, M.D.   Dg Fluoro Guide Cv Line-no Report  11/21/2012   CLINICAL DATA: Done in OR # 10,Intraoperative insertion port a cath   FLOURO GUIDE CV LINE  Fluoroscopy was utilized by the requesting physician.  No radiographic  interpretation.     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 She is in oligo metastases patient is very interested in being aggressive with her disease.She will receive a total of 6 cycles of FOLFOX 6. This will then be followed by radiation with radiosensitizing Xeloda. She will then finish up another 6 cycles of FOLFOX 6.  #2 Loculated pleural effusion status post packs procedure with chest tube  placement. Overall she is doing well. She did have strep infection she's now recovered.  #3 deconditioning  #4 anemia  PLAN:  #1 patient will not receive chemotherapy today due to to deconditioning and recent hospitalization. We will evaluate her in one week's time.  #2 I will check iron studies on her.  #3 she'll be seen back in one week's time for followup  All questions were answered. The patient knows to call the clinic with any problems, questions or concerns. We can certainly see the patient much sooner if necessary.  I spent 25 minutes counseling the patient face to face. The total time spent in the appointment was 30 minutes.  Drue Second, MD Medical/Oncology [facility] [phone] (beeper) [phone] (Office)

## 2013-01-17 NOTE — Patient Instructions (Signed)
Proceed with chemotherapy on 10/22  CT abdomen and pelvis  I will see you back in 1 week for follow up

## 2013-01-17 NOTE — Progress Notes (Signed)
OFFICE PROGRESS NOTE  CC  [name], MD [address] [place] [address]  DIAGNOSIS: 67 year old female with new diagnosis of rectal cancer with lung metastasis   PRIOR THERAPY: #17/27/2014 with acute episode of bright red blood per rectum and large clot formation, prompting her to be evaluated at the ED. Of note,colonoscopy prior to this admission had been performed on 03/04/10(screening) which showed three polyps ,largest 8 mm, removed, as well as moderate diverticulosis in the sigmoid colon, otherwise normal exam . On presentation, she had Positive Guaiac and a rectal mass had been palpated on exam. On 7/29 she underwent colonoscopy (Dr. Marina Goodell) which revealed a bulky, circumferential, friable mass lesion in the rectum of about 4 cm above the dentate line, subtotally obstructing and would not permit passage of the colonoscope proximally. Multiple biopsies were taken.  #2 patient's pathology revealed consistent with adenocarcinoma with primary from rectal. Lung biopsy was performed also that revealed metastatic disease.  #3 patient was seen by Dr. Dorris Fetch for possible resection of the lung metastasis. He recommended we proceed with chemotherapy first to see if we could do both lung metastases and with eventual resection.  #4 patient is recommended to proceed with palliative chemotherapy consisting of FOLFOX. We will hold Avastin every 2 rectal bleeding.    CURRENT THERAPY: Here for cycle 3 of FOLFOX 6  INTERVAL HISTORY: JOHANNY SEGERS 67 y.o. female returns for followup visit . Patient returns for weekly followup visit today. She seems to be doing much better. She is stronger and eating better. She is slowly regaining her strength after her surgery that was performed 2 weeks ago. She is walking without any shortness of breath. She is trying to gain weight. Her blood counts have significantly improved. She is complaining about some abdominal bloating and distention.  She has some constipation off-and-on. She has noticed some pellets but no bleeding when she has a bowel movement. This is concerning I have recommended that she have a CT of the abdomen and pelvis. She is scheduled to be seen by surgery next week. She is denying any nausea vomiting she has no fevers chills no cough no hemoptysis or hematemesis. Patient does have irritable bowel with sometimes constipation and sometimes diarrhea. She has not noticed any blood in her stool. Remainder of the 10 point review of systems is negative.   MEDICAL HISTORY: Past Medical History  Diagnosis Date  . Heart murmur, systolic 07/01/2011  . Postmenopausal HRT (hormone replacement therapy) 07/01/2011    surgical menopause  . Hx of hyperglycemia 07/01/2011  . Hx of compression fracture of spine 1985    t12 after a  20 foot fall  . Hx of fracture of wrist     after fall   . 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 Hx of colonic polyps   . Early cataract     stoneburner   . High frequency hearing loss     [facility] followed   . Diabetes mellitus without complication   . Cancer     colon  . Skin cancer   . Complication of anesthesia     pt hadpanic attacks with versed, no versed    ALLERGIES:  is allergic to ativan; midazolam; zolpidem tartrate; and neomycin-bacitracin zn-polymyx.  MEDICATIONS:  Current Outpatient Prescriptions  Medication Sig Dispense Refill  . Calcium Carbonate Antacid (TUMS E-X PO) Take 2 tablets by mouth daily.      . Calcium Carbonate-Vitamin D (CALTRATE 600+D  PO) Take 1 tablet by mouth daily.       Marland Kitchen dexamethasone (DECADRON) 4 MG tablet Take 2 tablets (8 mg total) by mouth 2 (two) times daily with a meal. Take daily starting the day after chemotherapy for 2 days. Take with food.  30 tablet  1  . estradiol (MINIVELLE) 0.0375 MG/24HR Place 1 patch onto the skin 2 (two) times a week. Sundays and Thursdays      . lidocaine-prilocaine (EMLA) cream Apply topically as needed.  30 g  5  .  megestrol (MEGACE ES) 625 MG/5ML suspension Take 5 mLs (625 mg total) by mouth daily.  150 mL  7  . metFORMIN (GLUCOPHAGE) 1000 MG tablet Take 1,000 mg by mouth 2 (two) times daily with a meal.      . METHYLTESTOSTERONE PO Take 1.25 mg by mouth daily.      . Multiple Vitamin (MULTIVITAMIN) tablet Take 1 tablet by mouth daily. Senior      . ondansetron (ZOFRAN ODT) 8 MG disintegrating tablet Take 1 tablet (8 mg total) by mouth every 8 (eight) hours as needed for nausea.  30 tablet  2  . oxyCODONE-acetaminophen (PERCOCET/ROXICET) 5-325 MG per tablet Take 1-2 tablets by mouth every 4 (four) hours as needed.  80 tablet  0  . polyethylene glycol (MIRALAX / GLYCOLAX) packet Take 17 g by mouth daily.  100 each  0  . Probiotic Product (SOLUBLE FIBER/PROBIOTICS PO) Take 1 tablet by mouth daily.      . sitaGLIPtin (JANUVIA) 100 MG tablet Take 100 mg by mouth daily.       No current facility-administered medications for this visit.    SURGICAL HISTORY:  Past Surgical History  Procedure Laterality Date  . Tonsillectomy  1952  . Adenoidectomy  1952  . Wrist surgery  1957    left reduction  . Compression fraction  1985    T12  . Anterior cruciate ligament repair  1993    right Murphy  . Cosmetic surgery  1999-2007    holderness blepharoplasty facial rhytidectomy mastopexy abdominoplasty   . Dental implant    . Dilation and curettage of uterus  1977  . Total abdominal hysterectomy  2005    with cystocele repair   . Colonoscopy N/A 10/24/2012    Procedure: COLONOSCOPY;  Surgeon: Hilarie Fredrickson, MD;  Location: [facility] ENDOSCOPY;  Service: Endoscopy;  Laterality: N/A;  . Colonoscopy N/A 10/25/2012    Procedure: COLONOSCOPY;  Surgeon: Hilarie Fredrickson, MD;  Location: [facility] ENDOSCOPY;  Service: Endoscopy;  Laterality: N/A;  . Video bronchoscopy Bilateral 10/27/2012    Procedure: VIDEO BRONCHOSCOPY WITH FLUORO;  Surgeon: Coralyn Helling, MD;  Location: [facility] ENDOSCOPY;  Service: Endoscopy;  Laterality: Bilateral;  .  Portacath placement Left 11/21/2012    Procedure: INSERTION PORT-A-CATH;  Surgeon: Loreli Slot, MD;  Location: [facility] OR;  Service: Thoracic;  Laterality: Left;  . Video assisted thoracoscopy Right 12/21/2012    Procedure: VIDEO ASSISTED THORACOSCOPY;  Surgeon: Loreli Slot, MD;  Location: [facility] OR;  Service: Thoracic;  Laterality: Right;  . Pleural effusion drainage Right 12/21/2012    Procedure: DRAINAGE OF PLEURAL EFFUSION;  Surgeon: Loreli Slot, MD;  Location: [facility] OR;  Service: Thoracic;  Laterality: Right;  . Decortication Right 12/21/2012    Procedure: DECORTICATION;  Surgeon: Loreli Slot, MD;  Location: [facility] OR;  Service: Thoracic;  Laterality: Right;    REVIEW OF SYSTEMS:  Pertinent items are noted in HPI.   HEALTH MAINTENANCE:  PHYSICAL EXAMINATION: Blood pressure 135/81, pulse 101, temperature 98.1 F (36.7 C), temperature source Oral, resp. rate 18, height 5\' 4"  (1.626 m), weight 126 lb 6.4 oz (57.335 kg). Body mass index is 21.69 kg/(m^2). General: Patient is a well appearing female in no acute distress HEENT: PERRLA, sclerae anicteric no conjunctival pallor, MMM Neck: supple, no palpable adenopathy Lungs: clear to auscultation bilaterally, no wheezes, rhonchi, or rales Cardiovascular: regular rate rhythm, S1, S2, no murmurs, rubs or gallops Abdomen: Soft, non-tender, non-distended, normoactive bowel sounds, no HSM Extremities: warm and well perfused, no clubbing, cyanosis, or edema Skin: No rashes or lesions Neuro: Non-focal ECOG PERFORMANCE STATUS: 0 - Asymptomatic  LABORATORY DATA: Lab Results  Component Value Date   WBC 13.9* 01/17/2013   HGB 11.5* 01/17/2013   HCT 35.2 01/17/2013   MCV 77.5* 01/17/2013   PLT 431* 01/17/2013      Chemistry      Component Value Date/Time   NA 136 01/17/2013 0817   NA 131* 12/23/2012 0343   K 3.6 01/17/2013 0817   K 4.5 12/23/2012 0343   CL 95* 12/23/2012 0343   CO2 21* 01/17/2013 0817   CO2 28  12/23/2012 0343   BUN 7.1 01/17/2013 0817   BUN 8 12/23/2012 0343   CREATININE 0.7 01/17/2013 0817   CREATININE 0.29* 12/23/2012 0343      Component Value Date/Time   CALCIUM 10.5* 01/17/2013 0817   CALCIUM 8.6 12/23/2012 0343   ALKPHOS 83 01/17/2013 0817   ALKPHOS 121* 12/23/2012 0343   AST 11 01/17/2013 0817   AST 40* 12/23/2012 0343   ALT 12 01/17/2013 0817   ALT 22 12/23/2012 0343   BILITOT 0.30 01/17/2013 0817   BILITOT 0.1* 12/23/2012 0343       RADIOGRAPHIC STUDIES:  Dg Chest 2 View Within Previous 72 Hours.  Films Obtained On Friday Are Acceptable For Monday And Tuesday Cases  11/18/2012   CLINICAL DATA:  67 year old female preoperative study. Lung mass. Colon cancer.  EXAM: CHEST  2 VIEW  COMPARISON:  PET-CT 11/02/2012 and earlier.  FINDINGS: 5 cm right lower lobe lung mass re- identified. Radiographically this appears not significantly changed since 10/25/2012. The 6 mm cavitary right upper lobe pulmonary nodule is faint but visible. Stable lung volumes. Stable cardiac size and mediastinal contours. The left lung is clear. No pneumothorax or effusion. Stable visualized osseous structures. Chronic T12 compression fracture. Flowing thoracic osteophytes.  IMPRESSION: Known right lung lesions. No new cardiopulmonary abnormality.   Electronically Signed   By: Augusto Gamble   On: 11/18/2012 15:53   Nm Pet Image Initial (pi) Skull Base To Thigh  11/02/2012   *RADIOLOGY REPORT*  Clinical Data:  Initial treatment strategy for rectal cancer. Circumferential rectal cancer 4 cm from the anal verge.  4 cm right lung mass.  NUCLEAR MEDICINE PET WHOLE BODY  Fasting Blood Glucose:  123  Technique:  19.1 mCi F-18 FDG was injected intravenously. CT data was obtained and used for attenuation correction and anatomic localization only.  (This was not acquired as a diagnostic CT examination.) Additional exam technical data entered on technologist worksheet.  Comparison:  CT scan from 10/25/2012  Findings:   Head/Neck:   No hypermetabolic lymph nodes in the neck.  Chest:  The previously characterized 5 cm right lower lobe pulmonary mass is hypermetabolic with SUV max = 8.  No evidence for hypermetabolic lymphadenopathy in the mediastinum or either hilum. No other hypermetabolic lung lesions are evident although the patient does have bilateral  small pulmonary parenchymal nodules (see posterior right upper lobe on image 73 and posterior right lower lobe on image 78).  Both of these small nodules are below the accepted size threshold for reliable resolution on PET imaging.  Abdomen/Pelvis:  No abnormal hypermetabolism is identified within the liver.  No hypermetabolic lymphadenopathy in the abdomen.  There is a prominent amount of physiologic F D G uptake in the colon, but this is associated with hypermetabolic uptake in a region of circumferential rectal wall thickening.  SUV max = 9 for the rectal hypermetabolism.  Skeleton:  No focal hypermetabolic activity to suggest skeletal metastasis.  IMPRESSION: Hypermetabolism in the area of apparent circumferential rectal wall thickening associate with hypermetabolic 5 cm right lower lobe pulmonary mass.  6 mm cavitary pulmonary nodule the posterior right upper lobe is not hypermetabolic on PET imaging, but is below the accepted size threshold for reliable resolution on PET.  A second tiny right lower lobe nodule is also noted.   Original Report Authenticated By: Kennith Center, M.D.   Dg Fluoro Guide Cv Line-no Report  11/21/2012   CLINICAL DATA: Done in OR # 10,Intraoperative insertion port a cath   FLOURO GUIDE CV LINE  Fluoroscopy was utilized by the requesting physician.  No radiographic  interpretation.     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 She is in oligo metastases patient is very interested in being aggressive with her disease.She will receive a total of 6 cycles of FOLFOX 6. This will then be followed  by radiation with radiosensitizing Xeloda. She will then finish up another 6 cycles of FOLFOX 6.  #2 patient's course was complicated after 2 cycles with hospitalization for pneumonia/loculated pleural effusion she did require VATs procedure with drainage of the pleural effusion. Her cultures did reveal strep. She has been on antibiotics. She is completed up and 10 now. She seems to be doing much better.  #3 patient has an appointment set up to be seen by a surgeon in [place]. She will also need an appointment set up to be seen by Dr. Margaretmary Bayley here as well as Dr. Romie Levee.   PLAN:  #1 proceed with cycle 3 of FOLFOX 6.  #2 Will obtain CT of the abdomen and pelvis.  #3 patient will be seen back in one week's time for followup  All questions were answered. The patient knows to call the clinic with any problems, questions or concerns. We can certainly see the patient much sooner if necessary.  I spent 25 minutes counseling the patient face to face. The total time spent in the appointment was 30 minutes.  Drue Second, MD Medical/Oncology [facility] [phone] (beeper) [phone] (Office)

## 2013-01-18 ENCOUNTER — Ambulatory Visit (HOSPITAL_BASED_OUTPATIENT_CLINIC_OR_DEPARTMENT_OTHER): Payer: 59

## 2013-01-18 VITALS — BP 131/63 | HR 107 | Temp 98.9°F | Resp 18

## 2013-01-18 DIAGNOSIS — C7800 Secondary malignant neoplasm of unspecified lung: Secondary | ICD-10-CM

## 2013-01-18 DIAGNOSIS — C20 Malignant neoplasm of rectum: Secondary | ICD-10-CM

## 2013-01-18 DIAGNOSIS — Z5111 Encounter for antineoplastic chemotherapy: Secondary | ICD-10-CM

## 2013-01-18 LAB — FUNGUS CULTURE W SMEAR: Fungal Smear: NONE SEEN

## 2013-01-18 MED ORDER — FLUOROURACIL CHEMO INJECTION 2.5 GM/50ML
400.0000 mg/m2 | Freq: Once | INTRAVENOUS | Status: AC
  Administered 2013-01-18: 700 mg via INTRAVENOUS
  Filled 2013-01-18: qty 14

## 2013-01-18 MED ORDER — OXALIPLATIN CHEMO INJECTION 100 MG/20ML
85.0000 mg/m2 | Freq: Once | INTRAVENOUS | Status: AC
  Administered 2013-01-18: 145 mg via INTRAVENOUS
  Filled 2013-01-18: qty 29

## 2013-01-18 MED ORDER — HEPARIN SOD (PORK) LOCK FLUSH 100 UNIT/ML IV SOLN
500.0000 [IU] | Freq: Once | INTRAVENOUS | Status: DC | PRN
  Filled 2013-01-18: qty 5

## 2013-01-18 MED ORDER — SODIUM CHLORIDE 0.9 % IV SOLN
2400.0000 mg/m2 | INTRAVENOUS | Status: DC
  Administered 2013-01-18: 4100 mg via INTRAVENOUS
  Filled 2013-01-18: qty 82

## 2013-01-18 MED ORDER — DEXTROSE 5 % IV SOLN
Freq: Once | INTRAVENOUS | Status: AC
  Administered 2013-01-18: 08:00:00 via INTRAVENOUS

## 2013-01-18 MED ORDER — DEXAMETHASONE SODIUM PHOSPHATE 10 MG/ML IJ SOLN
INTRAMUSCULAR | Status: AC
  Filled 2013-01-18: qty 1

## 2013-01-18 MED ORDER — SODIUM CHLORIDE 0.9 % IJ SOLN
10.0000 mL | INTRAMUSCULAR | Status: DC | PRN
  Filled 2013-01-18: qty 10

## 2013-01-18 MED ORDER — DEXTROSE 5 % IV SOLN
400.0000 mg/m2 | Freq: Once | INTRAVENOUS | Status: AC
  Administered 2013-01-18: 700 mg via INTRAVENOUS
  Filled 2013-01-18: qty 35

## 2013-01-18 MED ORDER — DEXAMETHASONE SODIUM PHOSPHATE 10 MG/ML IJ SOLN
10.0000 mg | Freq: Once | INTRAMUSCULAR | Status: AC
  Administered 2013-01-18: 10 mg via INTRAVENOUS

## 2013-01-18 MED ORDER — ONDANSETRON 8 MG/50ML IVPB (CHCC)
8.0000 mg | Freq: Once | INTRAVENOUS | Status: AC
  Administered 2013-01-18: 8 mg via INTRAVENOUS

## 2013-01-18 NOTE — Patient Instructions (Signed)
[facility] Discharge Instructions for Patients Receiving Chemotherapy  Today you received the following chemotherapy agents: Oxaliplatin, Leucovorin, 5-FU.  To help prevent nausea and vomiting after your treatment, we encourage you to take your nausea medication.    If you develop nausea and vomiting that is not controlled by your nausea medication, call the clinic.   BELOW ARE SYMPTOMS THAT SHOULD BE REPORTED IMMEDIATELY:  *FEVER GREATER THAN 100.5 F  *CHILLS WITH OR WITHOUT FEVER  NAUSEA AND VOMITING THAT IS NOT CONTROLLED WITH YOUR NAUSEA MEDICATION  *UNUSUAL SHORTNESS OF BREATH  *UNUSUAL BRUISING OR BLEEDING  TENDERNESS IN MOUTH AND THROAT WITH OR WITHOUT PRESENCE OF ULCERS  *URINARY PROBLEMS  *BOWEL PROBLEMS  UNUSUAL RASH Items with * indicate a potential emergency and should be followed up as soon as possible.  Feel free to call the clinic you have any questions or concerns. The clinic phone number is [phone].    

## 2013-01-20 ENCOUNTER — Encounter (INDEPENDENT_AMBULATORY_CARE_PROVIDER_SITE_OTHER): Payer: Self-pay | Admitting: General Surgery

## 2013-01-20 ENCOUNTER — Other Ambulatory Visit: Payer: Self-pay | Admitting: *Deleted

## 2013-01-20 ENCOUNTER — Ambulatory Visit (HOSPITAL_BASED_OUTPATIENT_CLINIC_OR_DEPARTMENT_OTHER): Payer: 59

## 2013-01-20 ENCOUNTER — Ambulatory Visit (INDEPENDENT_AMBULATORY_CARE_PROVIDER_SITE_OTHER): Payer: Commercial Managed Care - PPO | Admitting: General Surgery

## 2013-01-20 VITALS — BP 167/91 | HR 108 | Temp 98.6°F | Resp 18

## 2013-01-20 VITALS — BP 140/78 | HR 88 | Temp 98.2°F | Resp 14 | Ht 64.5 in | Wt 126.2 lb

## 2013-01-20 DIAGNOSIS — C7800 Secondary malignant neoplasm of unspecified lung: Secondary | ICD-10-CM

## 2013-01-20 DIAGNOSIS — C20 Malignant neoplasm of rectum: Secondary | ICD-10-CM

## 2013-01-20 DIAGNOSIS — Z452 Encounter for adjustment and management of vascular access device: Secondary | ICD-10-CM

## 2013-01-20 DIAGNOSIS — R918 Other nonspecific abnormal finding of lung field: Secondary | ICD-10-CM

## 2013-01-20 MED ORDER — SODIUM CHLORIDE 0.9 % IJ SOLN
10.0000 mL | INTRAMUSCULAR | Status: DC | PRN
  Administered 2013-01-20: 10 mL
  Filled 2013-01-20: qty 10

## 2013-01-20 MED ORDER — HEPARIN SOD (PORK) LOCK FLUSH 100 UNIT/ML IV SOLN
500.0000 [IU] | Freq: Once | INTRAVENOUS | Status: AC | PRN
  Administered 2013-01-20: 500 [IU]
  Filled 2013-01-20: qty 5

## 2013-01-20 NOTE — Patient Instructions (Signed)
Call the office if you decide to proceed with surgery.  I recommend treating you lung disease before treating the rectal mass unless the rectal mass becomes more symptomatic

## 2013-01-20 NOTE — Progress Notes (Signed)
Chief Complaint  Patient presents with  . New Evaluation    eval rectal mass     HISTORY: Anne [name] is a 67 y.o. female who presents to the office with rectal cancer metastatic to the lung.  This was found after episode of rectal bleeding.  She reports her usual state of health until July when she developed severe rectal bleeding. She underwent a colonoscopy approximately 2 and half years ago which was normal except for diverticulosis. She presented to the hospital with his rectal bleeding and was found to have a distal rectal mass approximately 4 cm from the anal verge. Metastatic workup was performed. She has 2 large lung metastases in her left lower lobe. She had undergone 2 episodes of FOLFOX when she developed left-sided pleuritic pain. She was found to have a large pleural effusion. She ultimately underwent a vats procedure. This was found to be inflammatory and due to a strep infection. There were no malignant cells identified in cytology.  She has now completed her third cycle of FOLFOX with a goal of 6. She is here today to discuss surgical options for her rectal cancer. She reports some moderate constipation and occasional abdominal cramping and bloating. She denies any rectal bleeding at this time. She is using MiraLax and having small bowel movements with this.  Past Medical History  Diagnosis Date  . Heart murmur, systolic 07/01/2011  . Postmenopausal HRT (hormone replacement therapy) 07/01/2011    surgical menopause  . Hx of hyperglycemia 07/01/2011  . Hx of compression fracture of spine 1985    t12 after a  20 foot fall  . Hx of fracture of wrist     after fall   . 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 Hx of colonic polyps   . Early cataract     stoneburner   . High frequency hearing loss     [facility]us followed   . Diabetes mellitus without complication   . Cancer     colon  . Skin cancer   . Complication of anesthesia     pt hadpanic attacks with versed, no versed       Past Surgical History  Procedure Laterality Date  . Tonsillectomy  1952  . Adenoidectomy  1952  . Wrist surgery  1957    left reduction  . Compression fraction  1985    T12  . Anterior cruciate ligament repair  1993    right Murphy  . Cosmetic surgery  1999-2007    holderness blepharoplasty facial rhytidectomy mastopexy abdominoplasty   . Dental implant    . Dilation and curettage of uterus  1977  . Total abdominal hysterectomy  2005    with cystocele repair   . Colonoscopy N/A 10/24/2012    Procedure: COLONOSCOPY;  Surgeon: Hilarie Fredrickson, MD;  Location: [facility] ENDOSCOPY;  Service: Endoscopy;  Laterality: N/A;  . Colonoscopy N/A 10/25/2012    Procedure: COLONOSCOPY;  Surgeon: Hilarie Fredrickson, MD;  Location: [facility] ENDOSCOPY;  Service: Endoscopy;  Laterality: N/A;  . Video bronchoscopy Bilateral 10/27/2012    Procedure: VIDEO BRONCHOSCOPY WITH FLUORO;  Surgeon: Coralyn Helling, MD;  Location: [facility] ENDOSCOPY;  Service: Endoscopy;  Laterality: Bilateral;  . Portacath placement Left 11/21/2012    Procedure: INSERTION PORT-A-CATH;  Surgeon: Loreli Slot, MD;  Location: [facility] OR;  Service: Thoracic;  Laterality: Left;  . Video assisted thoracoscopy Right 12/21/2012    Procedure: VIDEO ASSISTED THORACOSCOPY;  Surgeon: Loreli Slot,  MD;  Location: [facility] OR;  Service: Thoracic;  Laterality: Right;  . Pleural effusion drainage Right 12/21/2012    Procedure: DRAINAGE OF PLEURAL EFFUSION;  Surgeon: Loreli Slot, MD;  Location: [facility] OR;  Service: Thoracic;  Laterality: Right;  . Decortication Right 12/21/2012    Procedure: DECORTICATION;  Surgeon: Loreli Slot, MD;  Location: [facility] OR;  Service: Thoracic;  Laterality: Right;      Current Outpatient Prescriptions  Medication Sig Dispense Refill  . Calcium Carbonate Antacid (TUMS E-X PO) Take 2 tablets by mouth daily.      . Calcium Carbonate-Vitamin D (CALTRATE 600+D PO) Take 1 tablet by mouth daily.       Marland Kitchen dexamethasone (DECADRON) 4 MG tablet  Take 2 tablets (8 mg total) by mouth 2 (two) times daily with a meal. Take daily starting the day after chemotherapy for 2 days. Take with food.  30 tablet  1  . estradiol (MINIVELLE) 0.0375 MG/24HR Place 1 patch onto the skin 2 (two) times a week. Sundays and Thursdays      . megestrol (MEGACE ES) 625 MG/5ML suspension Take 5 mLs (625 mg total) by mouth daily.  150 mL  7  . metFORMIN (GLUCOPHAGE) 1000 MG tablet Take 1,000 mg by mouth 2 (two) times daily with a meal.      . METHYLTESTOSTERONE PO Take 1.25 mg by mouth daily.      . Multiple Vitamin (MULTIVITAMIN) tablet Take 1 tablet by mouth daily. Senior      . ondansetron (ZOFRAN ODT) 8 MG disintegrating tablet Take 1 tablet (8 mg total) by mouth every 8 (eight) hours as needed for nausea.  30 tablet  2  . oxyCODONE-acetaminophen (PERCOCET/ROXICET) 5-325 MG per tablet Take 1-2 tablets by mouth every 4 (four) hours as needed.  80 tablet  0  . polyethylene glycol (MIRALAX / GLYCOLAX) packet Take 17 g by mouth daily.  100 each  0  . Probiotic Product (SOLUBLE FIBER/PROBIOTICS PO) Take 1 tablet by mouth daily.      . sitaGLIPtin (JANUVIA) 100 MG tablet Take 100 mg by mouth daily.       No current facility-administered medications for this visit.      Allergies  Allergen Reactions  . Ativan [Lorazepam]     Causes panic attacks  . Midazolam Other (See Comments)    Caused very crazy feelings, terrors, panic attacks, anxiety  . Zolpidem Tartrate Other (See Comments)    Caused very crazy feelings, terrors, shaking, panic attacks, anxiety  . Neomycin-Bacitracin Zn-Polymyx Itching and Rash      Family History  Problem Relation Age of Onset  . Diabetes Father   . Cancer Father     oral/squamous cell  . Thyroid disease Father   . Heart attack Father     died age 58  . Dementia Mother   . Hypertension Mother   . Hyperlipidemia Mother   . Cancer Brother     retro lymphosarcoma agent orange  . Cancer Sister     clear cell ovarina and breast  insitu  . Obesity      siblings   . Diabetes Brother   . Alcohol abuse Brother     hx of  . Other      arrythmia   . Hypertension Sister   . Asthma Sister       History   Social History  . Marital Status: Widowed    Spouse Name: N/A    Number of Children: 2  .  Years of Education: N/A   Occupational History  . PHYSICIAN    Social History Main Topics  . Smoking status: Never Smoker   . Smokeless tobacco: Never Used  . Alcohol Use: 1.0 oz/week    2 drink(s) per week     Comment: occ  . Drug Use: No  . Sexual Activity: No   Other Topics Concern  . None   Social History Narrative   Divorced  2006 after 51 years marriage    MD developmental Dance movement psychotherapist    Social etoh   NON smoker   exercise 4 x per week  Has personal trainer   G4P2       REVIEW OF SYSTEMS - PERTINENT POSITIVES ONLY: Review of Systems - General ROS: negative for - chills or fever Hematological and Lymphatic ROS: negative for - bleeding problems Respiratory ROS: no cough, shortness of breath, or wheezing Cardiovascular ROS: no chest pain or dyspnea on exertion Gastrointestinal ROS: positive for - abdominal pain and appetite loss Genito-Urinary ROS: no dysuria, trouble voiding, or hematuria  EXAM: Filed Vitals:   01/20/13 1559  BP: 140/78  Pulse: 88  Temp: 98.2 F (36.8 C)  Resp: 14    Gen:  No acute distress.  Well nourished and well groomed.   Neurological: Alert and oriented to person, place, and time. Coordination normal.  Head: Normocephalic and atraumatic.  Eyes: Conjunctivae are normal. Pupils are equal, round, and reactive to light. No scleral icterus.  Neck: Normal range of motion. Cardiovascular: Normal rate Respiratory: Effort normal.   GI: Soft. Musculoskeletal: Normal range of motion.  Skin: Skin is warm and dry.  Psychiatric: Normal mood and affect. Behavior is normal. Judgment and thought content normal.     LABORATORY RESULTS: Available labs are reviewed   Lab Results  Component Value Date   CEA 7.1* 11/30/2012    RADIOLOGY RESULTS:   Images and reports are reviewed. Her CT chest shows signs of interval decrease in size of lung metastases. Her recent CT abdomen shows a stable rectal wall inflammation. She does have a large amount of stool burden on CT abdomen.    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 She has started chemotherapy for metastatic disease. We discussed the typical course of treatment. I told her that a lot of the recent literature for rectal cancer is leaning towards treating metastatic disease first, as long as the rectal cancer is asymptomatic or the symptoms are controlled.  It would be my preference to complete her 6 cycles of FOLFOX and then focus on treatment for her lung disease.  Once she has completed his treatment, she can be restaged.  Then, if they're still no signs of other metastatic disease we would proceed with pelvic radiation followed by low anterior resection with coloanal anastomosis and diverting ileostomy approximately 6-8 weeks after completion of chemotherapy and radiation.  This woefully allow her to get the maximum benefit from chemotherapy with a minimum amount of morbidity from pelvic surgery.  She is going to see a second Careers adviser for a second opinion. She will call the office if and when she is ready to proceed with rectal surgery. If she would like me to perform the surgery, I would like to see her before she starts pelvic irradiation to do a rectal exam and anoscopy.    Anne Panda, MD Colon and Rectal Surgery / General Surgery [facility]      Visit Diagnoses: 1. Rectal cancer metastasized to lung     Primary Care Physician: [name], MD

## 2013-01-23 ENCOUNTER — Telehealth: Payer: Self-pay | Admitting: Oncology

## 2013-01-23 ENCOUNTER — Encounter: Payer: Self-pay | Admitting: Radiation Oncology

## 2013-01-23 NOTE — Telephone Encounter (Signed)
returned pt call and advised on 10.28.14 appts.

## 2013-01-23 NOTE — Progress Notes (Signed)
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 Walker? Wheelchair?: ambulatory   SAFETY ISSUES:  Prior radiation? NO  Pacemaker/ICD? NO  Possible current pregnancy? NO  Is the patient on methotrexate? NO  Additional Complaints / other details:  67 year old female. Divorced. 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  Intention: aggressive tx. Complete six cycles of FOLFOX then, focus on lung treatment. Following treatment, restage if no signs of other mets then, proceed with pelvic radiation followed by low anterior resection with coloanal anastomosis and diverting ileostomy

## 2013-01-24 ENCOUNTER — Encounter: Payer: Self-pay | Admitting: Oncology

## 2013-01-24 ENCOUNTER — Encounter: Payer: Self-pay | Admitting: Radiation Oncology

## 2013-01-24 ENCOUNTER — Ambulatory Visit (INDEPENDENT_AMBULATORY_CARE_PROVIDER_SITE_OTHER): Payer: 59 | Admitting: Thoracic Surgery (Cardiothoracic Vascular Surgery)

## 2013-01-24 ENCOUNTER — Ambulatory Visit
Admission: RE | Admit: 2013-01-24 | Discharge: 2013-01-24 | Disposition: A | Discharge status: Home / Self Care | Payer: 59 | Source: Ambulatory Visit | Attending: Radiation Oncology | Admitting: Radiation Oncology

## 2013-01-24 ENCOUNTER — Ambulatory Visit
Admission: RE | Admit: 2013-01-24 | Discharge: 2013-01-24 | Disposition: A | Discharge status: Home / Self Care | Payer: 59 | Source: Ambulatory Visit | Attending: Thoracic Surgery (Cardiothoracic Vascular Surgery) | Admitting: Thoracic Surgery (Cardiothoracic Vascular Surgery)

## 2013-01-24 ENCOUNTER — Other Ambulatory Visit (HOSPITAL_BASED_OUTPATIENT_CLINIC_OR_DEPARTMENT_OTHER): Payer: 59 | Admitting: Lab

## 2013-01-24 ENCOUNTER — Ambulatory Visit (HOSPITAL_BASED_OUTPATIENT_CLINIC_OR_DEPARTMENT_OTHER): Payer: 59 | Admitting: Oncology

## 2013-01-24 ENCOUNTER — Encounter: Payer: Self-pay | Admitting: Thoracic Surgery (Cardiothoracic Vascular Surgery)

## 2013-01-24 VITALS — BP 135/83 | HR 98 | Temp 97.1°F | Resp 18 | Ht 64.0 in | Wt 121.9 lb

## 2013-01-24 VITALS — BP 123/75 | HR 112 | Resp 16 | Ht 64.0 in | Wt 126.0 lb

## 2013-01-24 VITALS — BP 161/88 | HR 111 | Temp 98.6°F | Resp 18 | Ht 64.5 in | Wt 122.0 lb

## 2013-01-24 DIAGNOSIS — C20 Malignant neoplasm of rectum: Secondary | ICD-10-CM

## 2013-01-24 DIAGNOSIS — C189 Malignant neoplasm of colon, unspecified: Secondary | ICD-10-CM

## 2013-01-24 DIAGNOSIS — C7800 Secondary malignant neoplasm of unspecified lung: Secondary | ICD-10-CM | POA: Insufficient documentation

## 2013-01-24 DIAGNOSIS — R918 Other nonspecific abnormal finding of lung field: Secondary | ICD-10-CM

## 2013-01-24 DIAGNOSIS — Z09 Encounter for follow-up examination after completed treatment for conditions other than malignant neoplasm: Secondary | ICD-10-CM

## 2013-01-24 DIAGNOSIS — J869 Pyothorax without fistula: Secondary | ICD-10-CM

## 2013-01-24 LAB — CBC WITH DIFFERENTIAL/PLATELET
Basophils Absolute: 0.1 10*3/uL (ref 0.0–0.1)
EOS%: 0.2 % (ref 0.0–7.0)
Eosinophils Absolute: 0 10*3/uL (ref 0.0–0.5)
HCT: 36.5 % (ref 34.8–46.6)
LYMPH%: 11.1 % — ABNORMAL LOW (ref 14.0–49.7)
MCH: 25.2 pg (ref 25.1–34.0)
MCV: 77.3 fL — ABNORMAL LOW (ref 79.5–101.0)
MONO#: 0.3 10*3/uL (ref 0.1–0.9)
MONO%: 1.6 % (ref 0.0–14.0)
NEUT#: 17.9 10*3/uL — ABNORMAL HIGH (ref 1.5–6.5)
Platelets: 316 10*3/uL (ref 145–400)
RBC: 4.73 10*6/uL (ref 3.70–5.45)
RDW: 21.4 % — ABNORMAL HIGH (ref 11.2–14.5)
WBC: 20.6 10*3/uL — ABNORMAL HIGH (ref 3.9–10.3)
lymph#: 2.3 10*3/uL (ref 0.9–3.3)

## 2013-01-24 LAB — COMPREHENSIVE METABOLIC PANEL (CC13)
AST: 9 U/L (ref 5–34)
Albumin: 2.8 g/dL — ABNORMAL LOW (ref 3.5–5.0)
Alkaline Phosphatase: 81 U/L (ref 40–150)
BUN: 11.7 mg/dL (ref 7.0–26.0)
Calcium: 10.7 mg/dL — ABNORMAL HIGH (ref 8.4–10.4)
Chloride: 98 mEq/L (ref 98–109)
Glucose: 252 mg/dl — ABNORMAL HIGH (ref 70–140)
Potassium: 4.1 mEq/L (ref 3.5–5.1)
Sodium: 133 mEq/L — ABNORMAL LOW (ref 136–145)
Total Bilirubin: 0.43 mg/dL (ref 0.20–1.20)

## 2013-01-24 MED ORDER — OMEPRAZOLE 40 MG PO CPDR: 40.0000 mg | DELAYED_RELEASE_CAPSULE | Freq: Every day | ORAL | Status: DC

## 2013-01-24 NOTE — Progress Notes (Signed)
OFFICE PROGRESS NOTE  CC  Anne Harp, MD [address] [place] [address]  DIAGNOSIS: 67 year old female with new diagnosis of rectal cancer with lung metastasis   PRIOR THERAPY: #17/27/2014 with acute episode of bright red blood per rectum and large clot formation, prompting her to be evaluated at the ED. Of note,colonoscopy prior to this admission had been performed on 03/04/10(screening) which showed three polyps ,largest 8 mm, removed, as well as moderate diverticulosis in the sigmoid colon, otherwise normal exam . On presentation, she had Positive Guaiac and a rectal mass had been palpated on exam. On 7/29 she underwent colonoscopy (Dr. Marina Goodell) which revealed a bulky, circumferential, friable mass lesion in the rectum of about 4 cm above the dentate line, subtotally obstructing and would not permit passage of the colonoscope proximally. Multiple biopsies were taken.  #2 patient's pathology revealed consistent with adenocarcinoma with primary from rectal. Lung biopsy was performed also that revealed metastatic disease.  #3 patient was seen by Dr. Dorris Fetch for possible resection of the lung metastasis. He recommended we proceed with chemotherapy first to see if we could do both lung metastases and with eventual resection.  #4 patient is recommended to proceed with palliative chemotherapy consisting of FOLFOX. We will hold Avastin every 2 rectal bleeding.    CURRENT THERAPY: s/p cycle 3 of FOLFOX 6  INTERVAL HISTORY: Anne [name] 67 y.o. female returns for followup visit . She has tolerated cycle 3 of chemotherapy well. She's also had her visits with the surgeon as well as Dr. Dorris Fetch and later on will see Dr. Margaretmary Dys. She is tired weak. She tells me that her appetite is significantly improved when she was on dexamethasone. She denies having any nausea or vomiting. She did have a little bit of cough over the weekend but it is now resolved. She felt like  she had metallic taste in her mouth. This could certainly be due to the chemotherapy. She's denying any peripheral paresthesias. No bleeding problems. She does continue to have chronic problems with constipation. She and I discussed her CT results. Remainder of the 10 point review of systems is negative.  MEDICAL HISTORY: Past Medical History  Diagnosis Date  . Heart murmur, systolic 07/01/2011  . Postmenopausal HRT (hormone replacement therapy) 07/01/2011    surgical menopause  . Hx of hyperglycemia 07/01/2011  . Hx of compression fracture of spine 1985    t12 after a  20 foot fall  . Hx of fracture of wrist     after fall   . 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 Hx of colonic polyps   . Early cataract     stoneburner   . High frequency hearing loss     kraus followed   . Diabetes mellitus without complication   . Skin cancer   . Complication of anesthesia     pt hadpanic attacks with versed, no versed  . Colon cancer     rectal cancer with lung metastasis  . Status post chemotherapy     completed a total of six cycles of Folfox     ALLERGIES:  is allergic to ativan; midazolam; zolpidem tartrate; and neomycin-bacitracin zn-polymyx.  MEDICATIONS:  Current Outpatient Prescriptions  Medication Sig Dispense Refill  . Calcium Carbonate Antacid (TUMS E-X PO) Take 2 tablets by mouth daily.      . Calcium Carbonate-Vitamin D (CALTRATE 600+D PO) Take 1 tablet by mouth daily.       Marland Kitchen  dexamethasone (DECADRON) 4 MG tablet Take 2 tablets (8 mg total) by mouth 2 (two) times daily with a meal. Take daily starting the day after chemotherapy for 2 days. Take with food.  30 tablet  1  . estradiol (MINIVELLE) 0.0375 MG/24HR Place 1 patch onto the skin 2 (two) times a week. Sundays and Thursdays      . megestrol (MEGACE ES) 625 MG/5ML suspension Take 5 mLs (625 mg total) by mouth daily.  150 mL  7  . metFORMIN (GLUCOPHAGE) 1000 MG tablet Take 1,000 mg by mouth 2 (two) times daily with a meal.       . METHYLTESTOSTERONE PO Take 1.25 mg by mouth daily.      . Multiple Vitamin (MULTIVITAMIN) tablet Take 1 tablet by mouth daily. Senior      . ondansetron (ZOFRAN ODT) 8 MG disintegrating tablet Take 1 tablet (8 mg total) by mouth every 8 (eight) hours as needed for nausea.  30 tablet  2  . oxyCODONE-acetaminophen (PERCOCET/ROXICET) 5-325 MG per tablet Take 1-2 tablets by mouth every 4 (four) hours as needed.  80 tablet  0  . polyethylene glycol (MIRALAX / GLYCOLAX) packet Take 17 g by mouth daily.  100 each  0  . Probiotic Product (SOLUBLE FIBER/PROBIOTICS PO) Take 1 tablet by mouth daily.      . sitaGLIPtin (JANUVIA) 100 MG tablet Take 100 mg by mouth daily.       No current facility-administered medications for this visit.    SURGICAL HISTORY:  Past Surgical History  Procedure Laterality Date  . Tonsillectomy  1952  . Adenoidectomy  1952  . Wrist surgery  1957    left reduction  . Compression fraction  1985    T12  . Anterior cruciate ligament repair  1993    right Murphy  . Cosmetic surgery  1999-2007    holderness blepharoplasty facial rhytidectomy mastopexy abdominoplasty   . Dental implant    . Dilation and curettage of uterus  1977  . Total abdominal hysterectomy  2005    with cystocele repair   . Colonoscopy N/A 10/24/2012    Procedure: COLONOSCOPY;  Surgeon: Hilarie Fredrickson, MD;  Location: [facility] ENDOSCOPY;  Service: Endoscopy;  Laterality: N/A;  . Colonoscopy N/A 10/25/2012    Procedure: COLONOSCOPY;  Surgeon: Hilarie Fredrickson, MD;  Location: [facility] ENDOSCOPY;  Service: Endoscopy;  Laterality: N/A;  . Video bronchoscopy Bilateral 10/27/2012    Procedure: VIDEO BRONCHOSCOPY WITH FLUORO;  Surgeon: Coralyn Helling, MD;  Location: [facility] ENDOSCOPY;  Service: Endoscopy;  Laterality: Bilateral;  . Portacath placement Left 11/21/2012    Procedure: INSERTION PORT-A-CATH;  Surgeon: Loreli Slot, MD;  Location: [facility] OR;  Service: Thoracic;  Laterality: Left;  . Video assisted thoracoscopy Right  12/21/2012    Procedure: VIDEO ASSISTED THORACOSCOPY;  Surgeon: Loreli Slot, MD;  Location: [facility] OR;  Service: Thoracic;  Laterality: Right;  . Pleural effusion drainage Right 12/21/2012    Procedure: DRAINAGE OF PLEURAL EFFUSION;  Surgeon: Loreli Slot, MD;  Location: [facility] OR;  Service: Thoracic;  Laterality: Right;  . Decortication Right 12/21/2012    Procedure: DECORTICATION;  Surgeon: Loreli Slot, MD;  Location: [facility] OR;  Service: Thoracic;  Laterality: Right;    REVIEW OF SYSTEMS:  Pertinent items are noted in HPI.   HEALTH MAINTENANCE:   PHYSICAL EXAMINATION: Blood pressure 135/83, pulse 98, temperature 97.1 F (36.2 C), temperature source Oral, resp. rate 18, height 5\' 4"  (1.626 m), weight 121 lb 14.4  oz (55.293 kg). Body mass index is 20.91 kg/(m^2). General: Patient is a well appearing female in no acute distress HEENT: PERRLA, sclerae anicteric no conjunctival pallor, MMM Neck: supple, no palpable adenopathy Lungs: clear to auscultation bilaterally, no wheezes, rhonchi, or rales Cardiovascular: regular rate rhythm, S1, S2, no murmurs, rubs or gallops Abdomen: Soft, non-tender, non-distended, normoactive bowel sounds, no HSM Extremities: warm and well perfused, no clubbing, cyanosis, or edema Skin: No rashes or lesions Neuro: Non-focal ECOG PERFORMANCE STATUS: 0 - Asymptomatic  LABORATORY DATA: Lab Results  Component Value Date   WBC 20.6* 01/24/2013   HGB 11.9 01/24/2013   HCT 36.5 01/24/2013   MCV 77.3* 01/24/2013   PLT 316 01/24/2013      Chemistry      Component Value Date/Time   NA 136 01/17/2013 0817   NA 131* 12/23/2012 0343   K 3.6 01/17/2013 0817   K 4.5 12/23/2012 0343   CL 95* 12/23/2012 0343   CO2 21* 01/17/2013 0817   CO2 28 12/23/2012 0343   BUN 7.1 01/17/2013 0817   BUN 8 12/23/2012 0343   CREATININE 0.7 01/17/2013 0817   CREATININE 0.29* 12/23/2012 0343      Component Value Date/Time   CALCIUM 10.5* 01/17/2013 0817   CALCIUM  8.6 12/23/2012 0343   ALKPHOS 83 01/17/2013 0817   ALKPHOS 121* 12/23/2012 0343   AST 11 01/17/2013 0817   AST 40* 12/23/2012 0343   ALT 12 01/17/2013 0817   ALT 22 12/23/2012 0343   BILITOT 0.30 01/17/2013 0817   BILITOT 0.1* 12/23/2012 0343       RADIOGRAPHIC STUDIES:  Dg Chest 2 View Within Previous 72 Hours.  Films Obtained On Friday Are Acceptable For Monday And Tuesday Cases  11/18/2012   CLINICAL DATA:  67 year old female preoperative study. Lung mass. Colon cancer.  EXAM: CHEST  2 VIEW  COMPARISON:  PET-CT 11/02/2012 and earlier.  FINDINGS: 5 cm right lower lobe lung mass re- identified. Radiographically this appears not significantly changed since 10/25/2012. The 6 mm cavitary right upper lobe pulmonary nodule is faint but visible. Stable lung volumes. Stable cardiac size and mediastinal contours. The left lung is clear. No pneumothorax or effusion. Stable visualized osseous structures. Chronic T12 compression fracture. Flowing thoracic osteophytes.  IMPRESSION: Known right lung lesions. No new cardiopulmonary abnormality.   Electronically Signed   By: Augusto Gamble   On: 11/18/2012 15:53   Nm Pet Image Initial (pi) Skull Base To Thigh  11/02/2012   *RADIOLOGY REPORT*  Clinical Data:  Initial treatment strategy for rectal cancer. Circumferential rectal cancer 4 cm from the anal verge.  4 cm right lung mass.  NUCLEAR MEDICINE PET WHOLE BODY  Fasting Blood Glucose:  123  Technique:  19.1 mCi F-18 FDG was injected intravenously. CT data was obtained and used for attenuation correction and anatomic localization only.  (This was not acquired as a diagnostic CT examination.) Additional exam technical data entered on technologist worksheet.  Comparison:  CT scan from 10/25/2012  Findings:  Head/Neck:   No hypermetabolic lymph nodes in the neck.  Chest:  The previously characterized 5 cm right lower lobe pulmonary mass is hypermetabolic with SUV max = 8.  No evidence for hypermetabolic lymphadenopathy in  the mediastinum or either hilum. No other hypermetabolic lung lesions are evident although the patient does have bilateral small pulmonary parenchymal nodules (see posterior right upper lobe on image 73 and posterior right lower lobe on image 78).  Both of these small nodules are  below the accepted size threshold for reliable resolution on PET imaging.  Abdomen/Pelvis:  No abnormal hypermetabolism is identified within the liver.  No hypermetabolic lymphadenopathy in the abdomen.  There is a prominent amount of physiologic F D G uptake in the colon, but this is associated with hypermetabolic uptake in a region of circumferential rectal wall thickening.  SUV max = 9 for the rectal hypermetabolism.  Skeleton:  No focal hypermetabolic activity to suggest skeletal metastasis.  IMPRESSION: Hypermetabolism in the area of apparent circumferential rectal wall thickening associate with hypermetabolic 5 cm right lower lobe pulmonary mass.  6 mm cavitary pulmonary nodule the posterior right upper lobe is not hypermetabolic on PET imaging, but is below the accepted size threshold for reliable resolution on PET.  A second tiny right lower lobe nodule is also noted.   Original Report Authenticated By: Kennith Center, M.D.   Dg Fluoro Guide Cv Line-no Report  11/21/2012   CLINICAL DATA: Done in OR # 10,Intraoperative insertion port a cath   FLOURO GUIDE CV LINE  Fluoroscopy was utilized by the requesting physician.  No radiographic  interpretation.     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 She is in oligo metastases patient is very interested in being aggressive with her disease.She will receive a total of 6 cycles of FOLFOX 6. This will then be followed by radiation with radiosensitizing Xeloda. She will then finish up another 6 cycles of FOLFOX 6.  #2 patient's course was complicated after 2 cycles with hospitalization for pneumonia/loculated pleural effusion she  did require VATs procedure with drainage of the pleural effusion. Her cultures did reveal strep. She has been on antibiotics. She is completed up and 10 now. She seems to be doing much better.  #3 patient is status post cycle 3 of FOLFOX. Overall she tolerated it well.  #4 patient was seen by Dr. Margaretmary Dys as well as Romie Levee. She was also seen by a surgeon at [facility]. She tells me that she will have her surgery done there.  PLAN:  #1 post chemotherapy patient looks well. She is trying to eat. We discussed her taking dexamethasone on a daily basis since she does improve her appetite when she takes the dexamethasone.  #2 Patient and I also discussed a good bowel regimen. I have instructed her to take Senokot twice a day along with MiraLax and prune juice to try to really clean her out. Since her CT scan did reveal significant amount as stool.  #3 patient will be seen back in one week's time for cycle 4 of FOLFOX. Once she completes this then I will plan on doing another CT of the chest to reevaluate response to chemotherapy in the chest.   All questions were answered. The patient knows to call the clinic with any problems, questions or concerns. We can certainly see the patient much sooner if necessary.  I spent 25 minutes counseling the patient face to face. The total time spent in the appointment was 30 minutes.  Drue Second, MD Medical/Oncology [facility] [phone] (beeper) [phone] (Office)

## 2013-01-24 NOTE — Progress Notes (Signed)
See progress note under physician encounter. 

## 2013-01-24 NOTE — Patient Instructions (Addendum)
Start taking the dexamethasone 8 mg daily to help stimulate your appetite. Take the dexamethasone with food  Begin a good bowel regimen with 2 senokott at PM and Am daily, miralax daily in the morning  Take prilosec 40 mg daily in the am  Discontinue megace and calcium  We will see you back in 1 week

## 2013-01-24 NOTE — Progress Notes (Signed)
Radiation Oncology         [phone] ________________________________  Initial outpatient Consultation  Name: Anne [name] MRN: [id number]  Date: 01/24/2013  DOB: [birth year]  WU:[id number],YNWGN KOTVAN, MD  Victorino December, MD   REFERRING PHYSICIAN: Victorino December, MD  DIAGNOSIS: 67 yo woman with rectal cancer rectal cancer and a solitary lung metastasis  HISTORY OF PRESENT ILLNESS::Anne [name] is a 67 y.o. female who presented with acute onset significant blood per rectum.  She had pelvic CT showing rectal mass and a right lower lung nodule.  Colonoscopy revealed a mass 4 cm above dentate which could not be passed due to near total obstruction.  Biopsy showed adenocarcinoma.  EUS was not attempted due to near obstruction.  Lung biopsy confirmed metastasis of colorectal origin.  The patient has gone on to receive 3 out of a planned 6 cycles FOLFOX.  This was complicated by a right pleural effusion ultimately requiring decortication, but cytology/tisse was negative.  Her lung nodule has decreased from 6.3 to 4.3 cm.  She presents today to discuss possible radiation therapy options.  PREVIOUS RADIATION THERAPY: No  PAST MEDICAL HISTORY:  has a past medical history of Postmenopausal HRT (hormone replacement therapy) (07/01/2011); hyperglycemia (07/01/2011); compression fracture of spine (1985); fracture of wrist; History of renal stone (6 12); colonic polyps; Early cataract; High frequency hearing loss; Diabetes mellitus without complication; Complication of anesthesia; Status post chemotherapy; Colon cancer; Skin cancer; and Heart murmur, systolic (07/01/2011).    PAST SURGICAL HISTORY: Past Surgical History  Procedure Laterality Date  . Tonsillectomy  1952  . Adenoidectomy  1952  . Wrist surgery  1957    left reduction  . Compression fraction  1985    T12  . Anterior cruciate ligament repair  1993    right Murphy  . Cosmetic surgery  1999-2007    holderness blepharoplasty facial  rhytidectomy mastopexy abdominoplasty   . Dental implant    . Dilation and curettage of uterus  1977  . Total abdominal hysterectomy  2005    with cystocele repair   . Colonoscopy N/A 10/24/2012    Procedure: COLONOSCOPY;  Surgeon: Hilarie Fredrickson, MD;  Location: [facility] ENDOSCOPY;  Service: Endoscopy;  Laterality: N/A;  . Colonoscopy N/A 10/25/2012    Procedure: COLONOSCOPY;  Surgeon: Hilarie Fredrickson, MD;  Location: [facility] ENDOSCOPY;  Service: Endoscopy;  Laterality: N/A;  . Video bronchoscopy Bilateral 10/27/2012    Procedure: VIDEO BRONCHOSCOPY WITH FLUORO;  Surgeon: Coralyn Helling, MD;  Location: [facility] ENDOSCOPY;  Service: Endoscopy;  Laterality: Bilateral;  . Portacath placement Left 11/21/2012    Procedure: INSERTION PORT-A-CATH;  Surgeon: Loreli Slot, MD;  Location: [facility] OR;  Service: Thoracic;  Laterality: Left;  . Video assisted thoracoscopy Right 12/21/2012    Procedure: VIDEO ASSISTED THORACOSCOPY;  Surgeon: Loreli Slot, MD;  Location: [facility] OR;  Service: Thoracic;  Laterality: Right;  . Pleural effusion drainage Right 12/21/2012    Procedure: DRAINAGE OF PLEURAL EFFUSION;  Surgeon: Loreli Slot, MD;  Location: [facility] OR;  Service: Thoracic;  Laterality: Right;  . Decortication Right 12/21/2012    Procedure: DECORTICATION;  Surgeon: Loreli Slot, MD;  Location: [facility] OR;  Service: Thoracic;  Laterality: Right;    FAMILY HISTORY: family history includes Asthma in her sister; Cancer in her brother, father, and sister; Dementia in her mother; Diabetes in her brother and father; Heart attack in her father; Hyperlipidemia in her mother; Hypertension in her mother and sister; Obesity in  her brother and mother; Thyroid disease in her father.  SOCIAL HISTORY:  reports that she has never smoked. She has never used smokeless tobacco. She reports that she drinks about 1.0 ounces of alcohol per week. She reports that she does not use illicit drugs.  ALLERGIES: Ativan; Midazolam; Zolpidem tartrate;  and Neomycin-bacitracin zn-polymyx  MEDICATIONS:  Current Outpatient Prescriptions  Medication Sig Dispense Refill  . dexamethasone (DECADRON) 4 MG tablet Take 8 mg by mouth daily with breakfast. To stimulate appetite.      Marland Kitchen estradiol (MINIVELLE) 0.0375 MG/24HR Place 1 patch onto the skin 2 (two) times a week. Sundays and Thursdays      . metFORMIN (GLUCOPHAGE) 1000 MG tablet Take 1,000 mg by mouth 2 (two) times daily with a meal.      . METHYLTESTOSTERONE PO Take 1.25 mg by mouth daily.      . Multiple Vitamin (MULTIVITAMIN) tablet Take 1 tablet by mouth daily. Senior      . omeprazole (PRILOSEC) 40 MG capsule Take 1 capsule (40 mg total) by mouth daily.  30 capsule  6  . oxyCODONE-acetaminophen (PERCOCET/ROXICET) 5-325 MG per tablet Take 1-2 tablets by mouth every 4 (four) hours as needed.  80 tablet  0  . polyethylene glycol (MIRALAX / GLYCOLAX) packet Take 17 g by mouth daily.  100 each  0  . Probiotic Product (SOLUBLE FIBER/PROBIOTICS PO) Take 1 tablet by mouth daily.      . sennosides-docusate sodium (SENOKOT-S) 8.6-50 MG tablet Take 2 tablets by mouth 2 (two) times daily. Am and pm doses      . sitaGLIPtin (JANUVIA) 100 MG tablet Take 100 mg by mouth daily.      . ondansetron (ZOFRAN ODT) 8 MG disintegrating tablet Take 1 tablet (8 mg total) by mouth every 8 (eight) hours as needed for nausea.  30 tablet  2   No current facility-administered medications for this encounter.    REVIEW OF SYSTEMS:  A 15 point review of systems is documented in the electronic medical record. This was obtained by the nursing staff. However, I reviewed this with the patient to discuss relevant findings and make appropriate changes.  Pertinent items are noted in HPI.   PHYSICAL EXAM: The patient is in no acute distress.    height is 5' 4.5" (1.638 m) and weight is 122 lb (55.339 kg). Her oral temperature is 98.6 F (37 C). Her blood pressure is 161/88 and her pulse is 111. Her respiration is 18 and oxygen  saturation is 95%.   No exam performed today, to allow sufficient time for patient counseling.Marland Kitchen  KPS = 90   - Able to carry on normal activity; minor signs or symptoms of disease.  LABORATORY DATA:  Lab Results  Component Value Date   WBC 20.6* 01/24/2013   HGB 11.9 01/24/2013   HCT 36.5 01/24/2013   MCV 77.3* 01/24/2013   PLT 316 01/24/2013   Lab Results  Component Value Date   NA 133* 01/24/2013   K 4.1 01/24/2013   CL 95* 12/23/2012   CO2 23 01/24/2013   Lab Results  Component Value Date   ALT 12 01/24/2013   AST 9 01/24/2013   ALKPHOS 81 01/24/2013   BILITOT 0.43 01/24/2013     RADIOGRAPHY:   Ct Chest W Contrast  01/05/2013   CLINICAL DATA:  Rectal cancer diagnosed in 2014. Status post the ATS PROCEDURE. Evaluate for recurrence of pleural fluid.  EXAM: CT CHEST WITH CONTRAST  TECHNIQUE: Multidetector CT  imaging of the chest was performed during intravenous contrast administration.  CONTRAST:  80mL OMNIPAQUE IOHEXOL 300 MG/ML  SOLN  COMPARISON:  Plain film 12/27/2012.  Most recent CT 12/16/12.  FINDINGS: Lungs/Pleura: 7mm right upper lobe lung nodule on image 20/ series 5. Similar to the 12/13/2012 exam.  Anterior right lower lobe lung mass. 4.3 x 3.9 cm on image 31/series 5. Decreased from 6.3 x 5.9 cm on the 12/13/2012 exam (when remeasured). Not well visualized on 12/16/12 exam due to collapse and pleural fluid.  5 mm left lower lobe nodule on image 40/series 5. Similar to 12/13/2012.  Similar 3 mm left lower lobe lung nodule on image 27/ series 5. Also unchanged since 12/13/2012.  Since 12/16/12, interval decrease in right-sided pleural fluid. There is loculated fluid and air anteriorly inferiorly on image 33/ series 2. This measures 5.9 x 3.6 cm. There is also a trace right inferior medial pleural fluid and air with thickening of the pleural surface on image 42/series 2.  Small volume loculated right-sided pleural fluid adjacent the apex on image 14/series 2.   Heart/Mediastinum: A left-sided Port-A-Cath which terminates at the cavoatrial junction. A tortuous descending thoracic aorta. Normal heart size, without pericardial effusion. Multivessel coronary artery atherosclerosis. No central pulmonary embolism, on this non-dedicated study. No mediastinal or hilar adenopathy.  Upper Abdomen:  No significant findings.  Bones/Musculoskeletal: Diffuse idiopathic skeletal hyperostosis. Moderate compression deformity at T12, without canal compromise.  IMPRESSION: 1. Since 12/16/12, improved appearance of the right pleural space. Decreased size of pleural fluid with areas of residual loculated fluid and air as detailed above. 2. Since 12/13/2012, decreased size of a right lower lobe dominant mass. Smaller nodules are not significantly changed.   Electronically Signed   By: Jeronimo Greaves M.D.   On: 01/05/2013 11:43   Ct Abdomen Pelvis W Contrast  01/17/2013   CLINICAL DATA:  Rectal carcinoma. Abdominal pain and distention. Ongoing chemotherapy. Recently postop from video-assisted thoracoscopy for pleural effusion drainage in decortication.  EXAM: CT ABDOMEN AND PELVIS WITH CONTRAST  TECHNIQUE: Multidetector CT imaging of the abdomen and pelvis was performed using the standard protocol following bolus administration of intravenous contrast.  CONTRAST:  OMNIPAQUE IOHEXOL 300 MG/ML  SOLN  COMPARISON:  12/16/12  FINDINGS: Postop changes noted in right lung base. The liver, gallbladder, pancreas, spleen, adrenal glands, and kidneys are normal in appearance. Tiny nonobstructive calculus measuring 1-2 mm is noted in the upper pole right kidney. No evidence of hydronephrosis.  Increased diffuse colonic distention with stool is seen since previous study extend to the level the rectum. There is mild diffuse rectal wall thickening which is stable. Sigmoid diverticulosis is demonstrated, however there is no evidence of diverticulitis. No evidence of small bowel dilatation. No soft  tissue masses or lymphadenopathy identified within the abdomen or pelvis. Prior hysterectomy noted. No suspicious bone lesions identified.  IMPRESSION: Increased diffuse colonic distention with large stool burden. Stable mild diffuse rectal wall thickening, without obstructing mass.  No evidence of abdominal or pelvic metastatic disease.   Electronically Signed   By: Myles Rosenthal M.D.   On: 01/17/2013 14:52     IMPRESSION: This patient is a delightful 67 year old woman with rectal cancer and a solitary lung metastasis.  She is responding to chemotherapy, with a plan to re-stage after 6 cycles.  At that point, she may be a candidate for right lower lobectomy, to be followed by pre-op chemo-RT and low anterior resection of the her rectal cancer.  PLAN:  Today, I talked to the patient and family about the findings and work-up thus far.  We discussed rectal cancer and general treatment options, highlighting the role or radiotherapy in the management.  We discussed the available radiation techniques, and focused on the details of logistics and delivery.  We reviewed the anticipated acute and late sequelae associated with radiation in this setting.  I filled out a patient counseling form during our discussion including treatment diagrams.  We retained a copy for our records.  The patient would like to proceed with radiation and will be scheduled for CT simulation.  I spent 60 minutes minutes face to face with the patient and more than 50% of that time was spent in counseling and/or coordination of care.   Given the potential of colorectal cancer which has metastasized to the lung to further metastasize to the brain, I am taking the liberty of ordering a baseline brain MRI.   ------------------------------------------------  Artist Pais. Kathrynn Running, M.D.

## 2013-01-24 NOTE — Progress Notes (Signed)
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  She is a 67 year old woman with metastatic rectal cancer. And she had started chemotherapy with FOLFOX6 in September. She developed a right pleural effusion which turned out to be an early organizing empyema. She had right VATS on 9/24. Her postoperative course was uncomplicated she was discharged home on postoperative day #6.  Since then she has restarted her chemotherapy. She only has mild pain from her incisions. She did have a cough with thin whitish mucus over the weekend but that resolved as of yesterday. She's not had any fevers or chills.  She has decided to have her rectal surgery at [facility] by Dr. Dalene [name].    Past Medical History  Diagnosis Date  . Heart murmur, systolic 07/01/2011  . Postmenopausal HRT (hormone replacement therapy) 07/01/2011    surgical menopause  . Hx of hyperglycemia 07/01/2011  . Hx of compression fracture of spine 1985    t12 after a  20 foot fall  . Hx of fracture of wrist     after fall   . 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 Hx of colonic polyps   . Early cataract     [place]   . High frequency hearing loss     [facility] followed   . Diabetes mellitus without complication   . Skin cancer   . Complication of anesthesia     pt hadpanic attacks with versed, no versed  . Colon cancer     rectal cancer with lung metastasis  . Status post chemotherapy     completed a total of six cycles of Folfox       Current Outpatient Prescriptions  Medication Sig Dispense Refill  . Calcium Carbonate Antacid (TUMS E-X PO) Take 2 tablets by mouth daily.      . Calcium Carbonate-Vitamin D (CALTRATE 600+D PO) Take 1 tablet by mouth daily.       Marland Kitchen dexamethasone (DECADRON) 4 MG tablet Take 2 tablets (8 mg total) by mouth 2 (two) times daily with a meal. Take daily starting the day after chemotherapy for 2 days. Take with food.  30 tablet  1  . estradiol (MINIVELLE) 0.0375 MG/24HR Place  1 patch onto the skin 2 (two) times a week. Sundays and Thursdays      . megestrol (MEGACE ES) 625 MG/5ML suspension Take 5 mLs (625 mg total) by mouth daily.  150 mL  7  . metFORMIN (GLUCOPHAGE) 1000 MG tablet Take 1,000 mg by mouth 2 (two) times daily with a meal.      . METHYLTESTOSTERONE PO Take 1.25 mg by mouth daily.      . Multiple Vitamin (MULTIVITAMIN) tablet Take 1 tablet by mouth daily. Senior      . ondansetron (ZOFRAN ODT) 8 MG disintegrating tablet Take 1 tablet (8 mg total) by mouth every 8 (eight) hours as needed for nausea.  30 tablet  2  . oxyCODONE-acetaminophen (PERCOCET/ROXICET) 5-325 MG per tablet Take 1-2 tablets by mouth every 4 (four) hours as needed.  80 tablet  0  . polyethylene glycol (MIRALAX / GLYCOLAX) packet Take 17 g by mouth daily.  100 each  0  . Probiotic Product (SOLUBLE FIBER/PROBIOTICS PO) Take 1 tablet by mouth daily.      . sitaGLIPtin (JANUVIA) 100 MG tablet Take 100 mg by mouth daily.       No current facility-administered medications for this visit.    Physical Exam  BP 123/75  Pulse 112  Resp 16  Ht 5\' 4"  (1.626 m)  Wt 126 lb (57.153 kg)  BMI 21.62 kg/m2  SpO15 75% 67 year old woman in no acute distress Incisions well healed Diminished breath sounds right base  Diagnostic Tests: Chest x-ray 01/24/2013 shows postoperative changes  Impression: 67 year old woman with metastatic rectal cancer who developed a right-sided empyema in the setting of chemotherapy. She is recovering well, all things considered. She has 3 more cycles of chemotherapy left.  She sees Dr. Welton [name] later today  Plan: I will plan to see her back after she completes her last cycle of chemotherapy we will need to repeat her CT at that time. I will defer to Dr. Welton [name] in terms of timing of scans as she may want to do a PET or also image the abdomen and pelvis at that time.

## 2013-01-24 NOTE — Progress Notes (Signed)
Correction of prior abstract note. Patient confirms she experienced right pleuritic pain not left and that there is one left lower lobe lung tumor not two. Patient denies shortness of breath. Patient reports that becomes tired easily thus, is being pushed in a wheelchair today by her sister. Reports an approximate weight loss of 8-10 lb since July 2014. Reports that she is scheduled to begin her fourth cycle of chemotherapy next week. Explains chemotherapy was delayed due to surgery. Reports that she will begin today as directed senokot and miralax bid to cleanse her system. Reports constipation. Reports she has a small daily movements. Reports that Dr. Vladimir Faster drained 3 liters from her chest then, another 5.5 liters while "clean up her lung." Reports cough has significantly improved since surgery. Reports abdominal cramping and bloating but, denies a pattern.

## 2013-01-25 ENCOUNTER — Telehealth: Payer: Self-pay | Admitting: *Deleted

## 2013-01-25 NOTE — Telephone Encounter (Signed)
Called patient to inform that test has been moved to [facility] on Nov. 5, 2014 - arrival time - 11:45 am, spoke with patient and she is aware of the change.

## 2013-01-25 NOTE — Telephone Encounter (Signed)
Called patient to inform of test, spoke with patient and she is aware of this appt.

## 2013-01-26 ENCOUNTER — Telehealth: Payer: Self-pay

## 2013-01-26 ENCOUNTER — Other Ambulatory Visit: Payer: Self-pay | Admitting: Emergency Medicine

## 2013-01-26 MED ORDER — PEG-KCL-NACL-NASULF-NA ASC-C 100 G PO SOLR: 1.0000 | Freq: Once | ORAL | Status: DC

## 2013-01-26 NOTE — Telephone Encounter (Signed)
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 Milta Deiters bowel regimen of 2 senokot bid and a capful of Miralax q am since visit 01-24-13.  She has had a few small pellets of stool today.  She has not "cleaned out" of stool.  She would like to try MoviPrep instead of taking pills frequently with minimal results. Her abdomen is slightly distended.  She is having a lot of flatulence.  Told [name] that Dr. Welton Flakes said that the prep would be fine and a prescription was sent to her pharmacy.  She will take the prep tomorrow as it is getting late in the day today. [name] knows to go to the ED if she passes and blood, experiences  severe abdominal pain,or projectile vomiting.

## 2013-01-28 ENCOUNTER — Emergency Department (HOSPITAL_COMMUNITY): Payer: 59

## 2013-01-28 ENCOUNTER — Emergency Department (HOSPITAL_COMMUNITY)
Admission: EM | Admit: 2013-01-28 | Discharge: 2013-01-28 | Disposition: A | Discharge status: Home / Self Care | Payer: 59 | Attending: Emergency Medicine | Admitting: Emergency Medicine

## 2013-01-28 ENCOUNTER — Encounter (HOSPITAL_COMMUNITY): Payer: Self-pay | Admitting: Emergency Medicine

## 2013-01-28 DIAGNOSIS — Z85828 Personal history of other malignant neoplasm of skin: Secondary | ICD-10-CM | POA: Insufficient documentation

## 2013-01-28 DIAGNOSIS — E119 Type 2 diabetes mellitus without complications: Secondary | ICD-10-CM | POA: Insufficient documentation

## 2013-01-28 DIAGNOSIS — R1084 Generalized abdominal pain: Secondary | ICD-10-CM | POA: Insufficient documentation

## 2013-01-28 DIAGNOSIS — Z85118 Personal history of other malignant neoplasm of bronchus and lung: Secondary | ICD-10-CM | POA: Insufficient documentation

## 2013-01-28 DIAGNOSIS — R141 Gas pain: Secondary | ICD-10-CM | POA: Insufficient documentation

## 2013-01-28 DIAGNOSIS — R140 Abdominal distension (gaseous): Secondary | ICD-10-CM

## 2013-01-28 DIAGNOSIS — Z9889 Other specified postprocedural states: Secondary | ICD-10-CM | POA: Insufficient documentation

## 2013-01-28 DIAGNOSIS — Z79899 Other long term (current) drug therapy: Secondary | ICD-10-CM | POA: Insufficient documentation

## 2013-01-28 DIAGNOSIS — Z860100 Personal history of colon polyps, unspecified: Secondary | ICD-10-CM | POA: Insufficient documentation

## 2013-01-28 DIAGNOSIS — R109 Unspecified abdominal pain: Secondary | ICD-10-CM

## 2013-01-28 DIAGNOSIS — R142 Eructation: Secondary | ICD-10-CM | POA: Insufficient documentation

## 2013-01-28 DIAGNOSIS — Z87442 Personal history of urinary calculi: Secondary | ICD-10-CM | POA: Insufficient documentation

## 2013-01-28 DIAGNOSIS — R011 Cardiac murmur, unspecified: Secondary | ICD-10-CM | POA: Insufficient documentation

## 2013-01-28 DIAGNOSIS — Z8781 Personal history of (healed) traumatic fracture: Secondary | ICD-10-CM | POA: Insufficient documentation

## 2013-01-28 DIAGNOSIS — Z85048 Personal history of other malignant neoplasm of rectum, rectosigmoid junction, and anus: Secondary | ICD-10-CM | POA: Insufficient documentation

## 2013-01-28 DIAGNOSIS — IMO0002 Reserved for concepts with insufficient information to code with codable children: Secondary | ICD-10-CM | POA: Insufficient documentation

## 2013-01-28 DIAGNOSIS — H918X9 Other specified hearing loss, unspecified ear: Secondary | ICD-10-CM | POA: Insufficient documentation

## 2013-01-28 LAB — CBC WITH DIFFERENTIAL/PLATELET
Basophils Absolute: 0 10*3/uL (ref 0.0–0.1)
Basophils Relative: 0 % (ref 0–1)
Hemoglobin: 11.1 g/dL — ABNORMAL LOW (ref 12.0–15.0)
MCHC: 33.8 g/dL (ref 30.0–36.0)
Monocytes Relative: 2 % — ABNORMAL LOW (ref 3–12)
Neutro Abs: 8 10*3/uL — ABNORMAL HIGH (ref 1.7–7.7)
Neutrophils Relative %: 90 % — ABNORMAL HIGH (ref 43–77)
RBC: 4.29 MIL/uL (ref 3.87–5.11)
RDW: 19.7 % — ABNORMAL HIGH (ref 11.5–15.5)

## 2013-01-28 LAB — COMPREHENSIVE METABOLIC PANEL
ALT: 33 U/L (ref 0–35)
AST: 22 U/L (ref 0–37)
Albumin: 2.8 g/dL — ABNORMAL LOW (ref 3.5–5.2)
Alkaline Phosphatase: 81 U/L (ref 39–117)
BUN: 12 mg/dL (ref 6–23)
Chloride: 92 mEq/L — ABNORMAL LOW (ref 96–112)
Creatinine, Ser: 0.32 mg/dL — ABNORMAL LOW (ref 0.50–1.10)
GFR calc Af Amer: >90 mL/min (ref >90)
Potassium: 3.8 mEq/L (ref 3.5–5.1)
Sodium: 129 mEq/L — ABNORMAL LOW (ref 135–145)
Total Bilirubin: 0.3 mg/dL (ref 0.3–1.2)
Total Protein: 7.1 g/dL (ref 6.0–8.3)

## 2013-01-28 MED ORDER — IOHEXOL 300 MG/ML  SOLN
100.0000 mL | Freq: Once | INTRAMUSCULAR | Status: AC | PRN
  Administered 2013-01-28: 100 mL via INTRAVENOUS

## 2013-01-28 MED ORDER — HYDROMORPHONE HCL PF 1 MG/ML IJ SOLN
1.0000 mg | Freq: Once | INTRAMUSCULAR | Status: AC
  Administered 2013-01-28: 1 mg via INTRAVENOUS
  Filled 2013-01-28: qty 1

## 2013-01-28 MED ORDER — SODIUM CHLORIDE 0.9 % IV BOLUS (SEPSIS)
1000.0000 mL | Freq: Once | INTRAVENOUS | Status: AC
  Administered 2013-01-28: 1000 mL via INTRAVENOUS

## 2013-01-28 MED ORDER — IOHEXOL 300 MG/ML  SOLN
50.0000 mL | Freq: Once | INTRAMUSCULAR | Status: AC | PRN
  Administered 2013-01-28: 50 mL via ORAL

## 2013-01-28 MED ORDER — HEPARIN SOD (PORK) LOCK FLUSH 100 UNIT/ML IV SOLN
500.0000 [IU] | Freq: Once | INTRAVENOUS | Status: AC
  Administered 2013-01-28: 500 [IU]
  Filled 2013-01-28: qty 5

## 2013-01-28 NOTE — ED Notes (Signed)
Pt from home reports that she is a colon Ca pt. Pt was instructed to "clean colon" and was to take a senokot and miralax on Tuesday with no some results, but not sufficient. Pt was then instructed by oncologist to take a colonoscopy prep. Pt had some results, but not enough. Pt called oncologist who advised her to be seen in ED for bloating, abd pain for possible obstruction. Pt adds that she took 2 Percocet at 11:00am today and another 1 Percocet at 12:30. Pt has had slight relief from pain. Pt is A&O and in NAD

## 2013-01-28 NOTE — Consult Note (Signed)
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 Staging evaluation revealed a solitary, biopsy-proven, right lung metastasis. She was started on a chemotherapy program with FOLFOX on August 26 on an every two-week schedule. Following the second treatment, treatment had to be interrupted due to interval development of a right pleural effusion which turned out to be a strep viridans empyema requiring a video-assisted thoracoscopy, drainage of the effusion, and a right decortication procedure by Dr. Dorris Fetch on September 24. Of note she never had a fever prior to the admission. Upon recovery, she resumed chemotherapy and had her third and most recent dose on October 22. A routine followup CT scan was done on October 21. Colon was noted to be diffusely distended and containing a large amount of stool. Stable rectal wall thickening. No soft tissue masses or lymphadenopathy. She was not experiencing any abdominal discomfort at that time. At time of followup visit with her oncologist on October 21, she was advised to intensify laxative therapy. She took a large volume of oral laxatives over the next few days. She called today to report sudden onset of intense crampy abdominal pain and distention. No vomiting. I asked her to come in for evaluation to rule out bowel perforation.  When she arrived in the emergency department, she was initially seen by a nurse practitioner. She was having significant pain. Exam at that point showed normal bowel sounds. No distention. Positive tenderness. No rigidity, no rebound, no guarding. She was given a dose of parenteral Dilaudid with rapid relief of her pain.  Baseline laboratory studies unremarkable except for moderate hyponatremia. Potassium is 3.8. BUN and creatinine normal. Liver functions normal.  I examined the patient  at approximately 5:40 PM. At present she is comfortable. Pharynx no erythema or exudate. Chest is clear on the left. There are rales at the right lung base. Regular cardiac rhythm without murmur Abdomen is mildly distended, soft, nontender, decreased bowel sounds, no palpable mass or organomegaly. I can feel subcutaneous sutures from previous "tummy tuck" operation along the midline.  Radiographic studies are pending  Impression:  Abdominal pain and distention following intense laxative use. No signs of peritonitis or bowel obstruction at this time.  I discussed management with the emergency room the attending. If CT scan is unremarkable, I would recommend discharge home. She can continue gentle laxatives. I would recommend MiraLax 2 days 3 times per day then decrease to once daily once her bowels are moving. I would use narcotic analgesics sparingly due to the anticholinergic effects which might exacerbate her abdominal symptoms. I discussed my recommendations with the patient and her daughter. She will call for any further problems.

## 2013-01-28 NOTE — ED Notes (Signed)
She states she has had recent issues with constipation which was unresponsive to Miralax and stool softeners and increased fluid intake.  She took a Movieprep per recommendation of her oncologist; and is here today with c/o marked, painful abd. Cramping.  She is in no distress.

## 2013-01-28 NOTE — ED Provider Notes (Signed)
Medical screening examination/treatment/procedure(s) were conducted as a shared visit with non-physician practitioner(s) and myself.  I personally evaluated the patient during the encounter.  EKG Interpretation   None       67 yo female with hx of colon cancer presenting with decreased stool output and now severe abdominal pain.  On exam, well appearing, no distress, abdomen tender in RLQ and LLQ, bowel sounds hypoactive.  She reports not passing flatus.  Plan CT and dispo per CT.  Clinical Impression: 1. Abdominal pain   2. Abdominal bloating       Candyce Churn, MD 01/29/13 [phone]

## 2013-01-28 NOTE — ED Provider Notes (Signed)
CSN: [id number]     Arrival date & time 01/28/13  1457 History   First MD Initiated Contact with Patient 01/28/13 1518     Chief Complaint  Patient presents with  . Abdominal Pain   (Consider location/radiation/quality/duration/timing/severity/associated sxs/prior Treatment) Patient is a 67 y.o. female presenting with abdominal pain. The history is provided by the patient. No language interpreter was used.  Abdominal Pain Pain location:  Generalized Pain quality: aching   Pain radiates to:  Does not radiate Pain severity:  Moderate Onset quality:  Gradual Progression:  Waxing and waning Context: not recent illness, not retching and not sick contacts   Associated symptoms: no chills, no diarrhea, no dysuria, no fever, no nausea and no vomiting   Pt is a 67 year old female who presents with bloating and abdominal pain. She reports that she was instructed by her oncologist to start a "bowel prep" to clean her out yesterday. She finished her prep and has had approx 6 stools today that have been smaller than usual and she has been bloated and having some diffuse abdominal pain. She reports that this pain is waxing and waning and feels crampy. She talked to her doctor by phone earlier and he asked her to come here for x-rays. She denies nausea, fever, chills, vomiting or recent illness.    Past Medical History  Diagnosis Date  . Postmenopausal HRT (hormone replacement therapy) 07/01/2011    surgical menopause  . Hx of hyperglycemia 07/01/2011  . Hx of compression fracture of spine 1985    t12 after a  20 foot fall  . Hx of fracture of wrist     after fall   . 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 Hx of colonic polyps   . Early cataract     stoneburner   . High frequency hearing loss     kraus followed   . Diabetes mellitus without complication   . Complication of anesthesia     pt hadpanic attacks with versed, no versed  . Status post chemotherapy     completed a total of three  cycles of Folfox   . Colon cancer     rectal cancer with lung metastasis  . Skin cancer     multiple  . Heart murmur, systolic 07/01/2011    innocent per Dr. Elijah Birk    Past Surgical History  Procedure Laterality Date  . Tonsillectomy  1952  . Adenoidectomy  1952  . Wrist surgery  1957    left reduction  . Compression fraction  1985    T12  . Anterior cruciate ligament repair  1993    right Murphy  . Cosmetic surgery  1999-2007    holderness blepharoplasty facial rhytidectomy mastopexy abdominoplasty   . Dental implant    . Dilation and curettage of uterus  1977  . Total abdominal hysterectomy  2005    with cystocele repair   . Colonoscopy N/A 10/24/2012    Procedure: COLONOSCOPY;  Surgeon: Hilarie Fredrickson, MD;  Location: [facility] ENDOSCOPY;  Service: Endoscopy;  Laterality: N/A;  . Colonoscopy N/A 10/25/2012    Procedure: COLONOSCOPY;  Surgeon: Hilarie Fredrickson, MD;  Location: [facility] ENDOSCOPY;  Service: Endoscopy;  Laterality: N/A;  . Video bronchoscopy Bilateral 10/27/2012    Procedure: VIDEO BRONCHOSCOPY WITH FLUORO;  Surgeon: Coralyn Helling, MD;  Location: [facility] ENDOSCOPY;  Service: Endoscopy;  Laterality: Bilateral;  . Portacath placement Left 11/21/2012    Procedure: INSERTION PORT-A-CATH;  Surgeon:  Loreli Slot, MD;  Location: [facility] OR;  Service: Thoracic;  Laterality: Left;  . Video assisted thoracoscopy Right 12/21/2012    Procedure: VIDEO ASSISTED THORACOSCOPY;  Surgeon: Loreli Slot, MD;  Location: [facility] OR;  Service: Thoracic;  Laterality: Right;  . Pleural effusion drainage Right 12/21/2012    Procedure: DRAINAGE OF PLEURAL EFFUSION;  Surgeon: Loreli Slot, MD;  Location: [facility] OR;  Service: Thoracic;  Laterality: Right;  . Decortication Right 12/21/2012    Procedure: DECORTICATION;  Surgeon: Loreli Slot, MD;  Location: [facility] OR;  Service: Thoracic;  Laterality: Right;   Family History  Problem Relation Age of Onset  . Diabetes Father   . Cancer Father     oral/squamous cell   . Thyroid disease Father   . Heart attack Father     died age 69  . Dementia Mother   . Hypertension Mother   . Hyperlipidemia Mother   . Obesity Mother   . Cancer Brother     retro lymphosarcoma agent orange  . Cancer Sister     clear cell ovarina and breast insitu  . Diabetes Brother   . Obesity Brother   . Hypertension Sister   . Asthma Sister    History  Substance Use Topics  . Smoking status: Never Smoker   . Smokeless tobacco: Never Used  . Alcohol Use: 1.0 oz/week    2 drink(s) per week     Comment: occ   OB History   Grav Para Term Preterm Abortions TAB SAB Ect Mult Living                 Review of Systems  Constitutional: Negative for fever and chills.  Gastrointestinal: Positive for abdominal pain. Negative for nausea, vomiting, diarrhea and abdominal distention.  Genitourinary: Negative for dysuria and urgency.  All other systems reviewed and are negative.    Allergies  Ativan; Midazolam; Zolpidem tartrate; and Neomycin-bacitracin zn-polymyx  Home Medications   Current Outpatient Rx  Name  Route  Sig  Dispense  Refill  . dexamethasone (DECADRON) 4 MG tablet   Oral   Take 8 mg by mouth daily with breakfast. To stimulate appetite.         Marland Kitchen estradiol (MINIVELLE) 0.0375 MG/24HR   Transdermal   Place 1 patch onto the skin 2 (two) times a week. Sundays and Thursdays         . metFORMIN (GLUCOPHAGE) 1000 MG tablet   Oral   Take 1,000 mg by mouth 2 (two) times daily with a meal.         . METHYLTESTOSTERONE PO   Oral   Take 1.25 mg by mouth daily.         . Multiple Vitamin (MULTIVITAMIN) tablet   Oral   Take 1 tablet by mouth daily. Senior         . omeprazole (PRILOSEC) 40 MG capsule   Oral   Take 1 capsule (40 mg total) by mouth daily.   30 capsule   6   . ondansetron (ZOFRAN ODT) 8 MG disintegrating tablet   Oral   Take 1 tablet (8 mg total) by mouth every 8 (eight) hours as needed for nausea.   30 tablet   2   .  oxyCODONE-acetaminophen (PERCOCET/ROXICET) 5-325 MG per tablet   Oral   Take 1-2 tablets by mouth every 4 (four) hours as needed.   80 tablet   0   . peg 3350 powder (MOVIPREP) 100 G SOLR  Oral   Take 1 kit (200 g total) by mouth once.   1 kit   2   . polyethylene glycol (MIRALAX / GLYCOLAX) packet   Oral   Take 17 g by mouth daily.   100 each   0   . Probiotic Product (SOLUBLE FIBER/PROBIOTICS PO)   Oral   Take 1 tablet by mouth daily.         . sennosides-docusate sodium (SENOKOT-S) 8.6-50 MG tablet   Oral   Take 2 tablets by mouth 2 (two) times daily. Am and pm doses         . sitaGLIPtin (JANUVIA) 100 MG tablet   Oral   Take 100 mg by mouth daily.          BP 152/88  Pulse 106  Temp(Src) 97.8 F (36.6 C) (Oral)  Resp 16  SpO2 97% Physical Exam  Nursing note and vitals reviewed. Constitutional: She is oriented to person, place, and time. She appears well-developed and well-nourished. No distress.  HENT:  Head: Normocephalic and atraumatic.  Eyes: Conjunctivae and EOM are normal. Pupils are equal, round, and reactive to light.  Neck: Normal range of motion. Neck supple.  Cardiovascular: Normal rate, regular rhythm, normal heart sounds and intact distal pulses.   Pulmonary/Chest: Effort normal and breath sounds normal. No respiratory distress. She has no wheezes.  Abdominal: Soft. Bowel sounds are normal. She exhibits no distension. There is generalized tenderness. There is no rigidity, no rebound, no guarding, no tenderness at McBurney's point and negative Murphy's sign.  Musculoskeletal: Normal range of motion.  Neurological: She is alert and oriented to person, place, and time.  Skin: Skin is warm and dry.  Psychiatric: She has a normal mood and affect. Her behavior is normal. Judgment and thought content normal.    ED Course  Procedures (including critical care time) Labs Review Labs Reviewed - No data to display Imaging Review No results  found.  EKG Interpretation   None       MDM   1. Abdominal pain   2. Abdominal bloating    Sent here by Dr. Cyndie Chime for x-ray or CT to r/o obstruction. Pt reports pain relieved after fluids and meds here. Labs unremarkable. CT scan; colonic distention with large stool burden, distal sigmoid and rectal wall thickening. No abscess or free fluid or other complication. Pt is well-appearing, no nausea or vomiting. Primary complaint is bloating with some abdominal cramping and tenderness.     Irish Elders, NP 01/29/13 1728

## 2013-01-30 ENCOUNTER — Other Ambulatory Visit: Payer: Self-pay | Admitting: Emergency Medicine

## 2013-01-30 DIAGNOSIS — G8918 Other acute postprocedural pain: Secondary | ICD-10-CM

## 2013-01-30 MED ORDER — OXYCODONE-ACETAMINOPHEN 5-325 MG PO TABS: 1.0000 | ORAL_TABLET | ORAL | Status: DC | PRN

## 2013-01-31 ENCOUNTER — Telehealth: Payer: Self-pay | Admitting: *Deleted

## 2013-01-31 ENCOUNTER — Encounter: Payer: Self-pay | Admitting: Oncology

## 2013-01-31 ENCOUNTER — Ambulatory Visit (HOSPITAL_BASED_OUTPATIENT_CLINIC_OR_DEPARTMENT_OTHER): Payer: 59 | Admitting: Oncology

## 2013-01-31 ENCOUNTER — Other Ambulatory Visit: Payer: 59 | Admitting: Lab

## 2013-01-31 ENCOUNTER — Ambulatory Visit (HOSPITAL_BASED_OUTPATIENT_CLINIC_OR_DEPARTMENT_OTHER): Payer: 59

## 2013-01-31 ENCOUNTER — Other Ambulatory Visit (HOSPITAL_BASED_OUTPATIENT_CLINIC_OR_DEPARTMENT_OTHER): Payer: 59 | Admitting: Lab

## 2013-01-31 VITALS — BP 161/90 | HR 98 | Temp 98.0°F | Resp 20 | Ht 64.5 in | Wt 123.1 lb

## 2013-01-31 DIAGNOSIS — C7800 Secondary malignant neoplasm of unspecified lung: Secondary | ICD-10-CM

## 2013-01-31 DIAGNOSIS — C189 Malignant neoplasm of colon, unspecified: Secondary | ICD-10-CM

## 2013-01-31 DIAGNOSIS — C20 Malignant neoplasm of rectum: Secondary | ICD-10-CM

## 2013-01-31 DIAGNOSIS — R918 Other nonspecific abnormal finding of lung field: Secondary | ICD-10-CM

## 2013-01-31 DIAGNOSIS — Z5111 Encounter for antineoplastic chemotherapy: Secondary | ICD-10-CM

## 2013-01-31 LAB — CBC WITH DIFFERENTIAL/PLATELET
Basophils Absolute: 0 10*3/uL (ref 0.0–0.1)
EOS%: 0.4 % (ref 0.0–7.0)
Eosinophils Absolute: 0 10*3/uL (ref 0.0–0.5)
HCT: 33.4 % — ABNORMAL LOW (ref 34.8–46.6)
HGB: 10.9 g/dL — ABNORMAL LOW (ref 11.6–15.9)
LYMPH%: 18.5 % (ref 14.0–49.7)
MCH: 25.4 pg (ref 25.1–34.0)
MCV: 77.9 fL — ABNORMAL LOW (ref 79.5–101.0)
MONO#: 0.2 10*3/uL (ref 0.1–0.9)
NEUT#: 3.9 10*3/uL (ref 1.5–6.5)
NEUT%: 77 % — ABNORMAL HIGH (ref 38.4–76.8)
RDW: 20.1 % — ABNORMAL HIGH (ref 11.2–14.5)
lymph#: 0.9 10*3/uL (ref 0.9–3.3)

## 2013-01-31 LAB — COMPREHENSIVE METABOLIC PANEL (CC13)
Albumin: 3.1 g/dL — ABNORMAL LOW (ref 3.5–5.0)
BUN: 11.7 mg/dL (ref 7.0–26.0)
CO2: 19 mEq/L — ABNORMAL LOW (ref 22–29)
Calcium: 10.1 mg/dL (ref 8.4–10.4)
Chloride: 97 mEq/L — ABNORMAL LOW (ref 98–109)
Creatinine: 0.7 mg/dL (ref 0.6–1.1)
Glucose: 327 mg/dl — ABNORMAL HIGH (ref 70–140)
Potassium: 4.3 mEq/L (ref 3.5–5.1)

## 2013-01-31 MED ORDER — FLUOROURACIL CHEMO INJECTION 2.5 GM/50ML
700.0000 mg | Freq: Once | INTRAVENOUS | Status: AC
  Administered 2013-01-31: 700 mg via INTRAVENOUS
  Filled 2013-01-31: qty 14

## 2013-01-31 MED ORDER — OXALIPLATIN CHEMO INJECTION 100 MG/20ML
85.0000 mg/m2 | Freq: Once | INTRAVENOUS | Status: AC
  Administered 2013-01-31: 145 mg via INTRAVENOUS
  Filled 2013-01-31: qty 29

## 2013-01-31 MED ORDER — DEXTROSE 5 % IV SOLN
Freq: Once | INTRAVENOUS | Status: AC
  Administered 2013-01-31: 10:00:00 via INTRAVENOUS

## 2013-01-31 MED ORDER — DEXAMETHASONE 4 MG PO TABS: 8.0000 mg | ORAL_TABLET | Freq: Every day | ORAL | Status: DC

## 2013-01-31 MED ORDER — DEXAMETHASONE SODIUM PHOSPHATE 10 MG/ML IJ SOLN
10.0000 mg | Freq: Once | INTRAMUSCULAR | Status: AC
  Administered 2013-01-31: 10 mg via INTRAVENOUS

## 2013-01-31 MED ORDER — LEUCOVORIN CALCIUM INJECTION 350 MG
700.0000 mg | Freq: Once | INTRAVENOUS | Status: AC
  Administered 2013-01-31: 700 mg via INTRAVENOUS
  Filled 2013-01-31: qty 35

## 2013-01-31 MED ORDER — SODIUM CHLORIDE 0.9 % IV SOLN
2400.0000 mg/m2 | INTRAVENOUS | Status: DC
  Administered 2013-01-31: 4100 mg via INTRAVENOUS
  Filled 2013-01-31: qty 82

## 2013-01-31 MED ORDER — DEXAMETHASONE SODIUM PHOSPHATE 10 MG/ML IJ SOLN
INTRAMUSCULAR | Status: AC
  Filled 2013-01-31: qty 1

## 2013-01-31 MED ORDER — ONDANSETRON 8 MG/50ML IVPB (CHCC)
8.0000 mg | Freq: Once | INTRAVENOUS | Status: AC
  Administered 2013-01-31: 8 mg via INTRAVENOUS

## 2013-01-31 MED ORDER — ONDANSETRON 8 MG/NS 50 ML IVPB
INTRAVENOUS | Status: AC
  Filled 2013-01-31: qty 8

## 2013-01-31 NOTE — Patient Instructions (Signed)
[facility] Discharge Instructions for Patients Receiving Chemotherapy  Today you received the following chemotherapy agents :  Oxaliplatin, Leucovorin, 5FU.  To help prevent nausea and vomiting after your treatment, we encourage you to take your nausea medication as instructed by your physician.   If you develop nausea and vomiting that is not controlled by your nausea medication, call the clinic.   BELOW ARE SYMPTOMS THAT SHOULD BE REPORTED IMMEDIATELY:  *FEVER GREATER THAN 100.5 F  *CHILLS WITH OR WITHOUT FEVER  NAUSEA AND VOMITING THAT IS NOT CONTROLLED WITH YOUR NAUSEA MEDICATION  *UNUSUAL SHORTNESS OF BREATH  *UNUSUAL BRUISING OR BLEEDING  TENDERNESS IN MOUTH AND THROAT WITH OR WITHOUT PRESENCE OF ULCERS  *URINARY PROBLEMS  *BOWEL PROBLEMS  UNUSUAL RASH Items with * indicate a potential emergency and should be followed up as soon as possible.  Feel free to call the clinic you have any questions or concerns. The clinic phone number is [phone].    

## 2013-01-31 NOTE — Progress Notes (Signed)
OFFICE PROGRESS NOTE  CC  Anne Harp, MD [address] [place] [address]  DIAGNOSIS: 67 year old female with new diagnosis of rectal cancer with lung metastasis   PRIOR THERAPY: #17/27/2014 with acute episode of bright red blood per rectum and large clot formation, prompting her to be evaluated at the ED. Of note,colonoscopy prior to this admission had been performed on 03/04/10(screening) which showed three polyps ,largest 8 mm, removed, as well as moderate diverticulosis in the sigmoid colon, otherwise normal exam . On presentation, she had Positive Guaiac and a rectal mass had been palpated on exam. On 7/29 she underwent colonoscopy (Dr. Marina Goodell) which revealed a bulky, circumferential, friable mass lesion in the rectum of about 4 cm above the dentate line, subtotally obstructing and would not permit passage of the colonoscope proximally. Multiple biopsies were taken.  #2 patient's pathology revealed consistent with adenocarcinoma with primary from rectal. Lung biopsy was performed also that revealed metastatic disease.  #3 patient was seen by Dr. Dorris Fetch for possible resection of the lung metastasis. He recommended we proceed with chemotherapy first to see if we could do both lung metastases and with eventual resection.  #4 patient is recommended to proceed with palliative chemotherapy consisting of FOLFOX. We will hold Avastin every 2 rectal bleeding.    CURRENT THERAPY: cycle 4 of FOLFOX 6  INTERVAL HISTORY: SANYAH MOLNAR 67 y.o. female returns for followup visit . Patient was seen in the emergency room for severe abdominal pain after taking laxatives. Her abdominal pain is now significantly improved and in fact she is having a bowel movement twice a day which she states is "mushy". She has no abdominal pain no nausea or vomiting. She has been taking steroids with her appetite and it is certainly helping her. She has no fevers chills or night sweats. Patient  does have a cough off-and-on of course this is concerning due to her history of lung metastasis as well as pleural effusion. She understands that she still has some loculated fluid. Because of this I do want to keep follow up CT scans to keep a close eye on this. Patient is tired and she is fatigued. But overall she does look terrific she will proceed with chemotherapy today.  MED ICAL HISTORY: Past Medical History  Diagnosis Date  . Postmenopausal HRT (hormone replacement therapy) 07/01/2011    surgical menopause  . Hx of hyperglycemia 07/01/2011  . Hx of compression fracture of spine 1985    t12 after a  20 foot fall  . Hx of fracture of wrist     after fall   . 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 Hx of colonic polyps   . Early cataract     stoneburner   . High frequency hearing loss     kraus followed   . Diabetes mellitus without complication   . Complication of anesthesia     pt hadpanic attacks with versed, no versed  . Status post chemotherapy     completed a total of three cycles of Folfox   . Colon cancer     rectal cancer with lung metastasis  . Skin cancer     multiple  . Heart murmur, systolic 07/01/2011    innocent per Dr. Elijah Birk     ALLERGIES:  is allergic to ativan; midazolam; zolpidem tartrate; and neomycin-bacitracin zn-polymyx.  MEDICATIONS:  Current Outpatient Prescriptions  Medication Sig Dispense Refill  . dexamethasone (DECADRON) 4 MG tablet Take 8  mg by mouth daily with breakfast. To stimulate appetite.      Marland Kitchen estradiol (MINIVELLE) 0.0375 MG/24HR Place 1 patch onto the skin 2 (two) times a week. Sundays and Thursdays      . metFORMIN (GLUCOPHAGE) 1000 MG tablet Take 1,000 mg by mouth 2 (two) times daily with a meal.      . Multiple Vitamin (MULTIVITAMIN) tablet Take 1 tablet by mouth daily. Senior      . omeprazole (PRILOSEC) 40 MG capsule Take 1 capsule (40 mg total) by mouth daily.  30 capsule  6  . ondansetron (ZOFRAN ODT) 8 MG disintegrating tablet  Take 1 tablet (8 mg total) by mouth every 8 (eight) hours as needed for nausea.  30 tablet  2  . oxyCODONE-acetaminophen (PERCOCET/ROXICET) 5-325 MG per tablet Take 1-2 tablets by mouth every 4 (four) hours as needed.  80 tablet  0  . polyethylene glycol (MIRALAX / GLYCOLAX) packet Take 17 g by mouth daily.  100 each  0  . Probiotic Product (SOLUBLE FIBER/PROBIOTICS PO) Take 1 tablet by mouth daily.      . sennosides-docusate sodium (SENOKOT-S) 8.6-50 MG tablet Take 2 tablets by mouth 2 (two) times daily. Am and pm doses      . sitaGLIPtin (JANUVIA) 100 MG tablet Take 100 mg by mouth daily.      . peg 3350 powder (MOVIPREP) 100 G SOLR Take 1 kit (200 g total) by mouth once.  1 kit  2   No current facility-administered medications for this visit.    SURGICAL HISTORY:  Past Surgical History  Procedure Laterality Date  . Tonsillectomy  1952  . Adenoidectomy  1952  . Wrist surgery  1957    left reduction  . Compression fraction  1985    T12  . Anterior cruciate ligament repair  1993    right Murphy  . Cosmetic surgery  1999-2007    holderness blepharoplasty facial rhytidectomy mastopexy abdominoplasty   . Dental implant    . Dilation and curettage of uterus  1977  . Total abdominal hysterectomy  2005    with cystocele repair   . Colonoscopy N/A 10/24/2012    Procedure: COLONOSCOPY;  Surgeon: Hilarie Fredrickson, MD;  Location: [facility] ENDOSCOPY;  Service: Endoscopy;  Laterality: N/A;  . Colonoscopy N/A 10/25/2012    Procedure: COLONOSCOPY;  Surgeon: Hilarie Fredrickson, MD;  Location: [facility] ENDOSCOPY;  Service: Endoscopy;  Laterality: N/A;  . Video bronchoscopy Bilateral 10/27/2012    Procedure: VIDEO BRONCHOSCOPY WITH FLUORO;  Surgeon: Coralyn Helling, MD;  Location: [facility] ENDOSCOPY;  Service: Endoscopy;  Laterality: Bilateral;  . Portacath placement Left 11/21/2012    Procedure: INSERTION PORT-A-CATH;  Surgeon: Loreli Slot, MD;  Location: [facility] OR;  Service: Thoracic;  Laterality: Left;  . Video assisted  thoracoscopy Right 12/21/2012    Procedure: VIDEO ASSISTED THORACOSCOPY;  Surgeon: Loreli Slot, MD;  Location: [facility] OR;  Service: Thoracic;  Laterality: Right;  . Pleural effusion drainage Right 12/21/2012    Procedure: DRAINAGE OF PLEURAL EFFUSION;  Surgeon: Loreli Slot, MD;  Location: [facility] OR;  Service: Thoracic;  Laterality: Right;  . Decortication Right 12/21/2012    Procedure: DECORTICATION;  Surgeon: Loreli Slot, MD;  Location: [facility] OR;  Service: Thoracic;  Laterality: Right;    REVIEW OF SYSTEMS:  Pertinent items are noted in HPI.   HEALTH MAINTENANCE:   PHYSICAL EXAMINATION: Blood pressure 161/90, pulse 98, temperature 98 F (36.7 C), temperature source Oral, resp. rate 20, height  5' 4.5" (1.638 m), weight 123 lb 1.6 oz (55.838 kg). Body mass index is 20.81 kg/(m^2). General: Patient is a well appearing female in no acute distress HEENT: PERRLA, sclerae anicteric no conjunctival pallor, MMM Neck: supple, no palpable adenopathy Lungs: clear to auscultation bilaterally, no wheezes, rhonchi, or rales Cardiovascular: regular rate rhythm, S1, S2, no murmurs, rubs or gallops Abdomen: Soft, non-tender, non-distended, normoactive bowel sounds, no HSM Extremities: warm and well perfused, no clubbing, cyanosis, or edema Skin: No rashes or lesions Neuro: Non-focal ECOG PERFORMANCE STATUS: 0 - Asymptomatic  LABORATORY DATA: Lab Results  Component Value Date   WBC 5.1 01/31/2013   HGB 10.9* 01/31/2013   HCT 33.4* 01/31/2013   MCV 77.9* 01/31/2013   PLT 375 01/31/2013      Chemistry      Component Value Date/Time   NA 129* 01/28/2013 1615   NA 133* 01/24/2013 1319   K 3.8 01/28/2013 1615   K 4.1 01/24/2013 1319   CL 92* 01/28/2013 1615   CO2 22 01/28/2013 1615   CO2 23 01/24/2013 1319   BUN 12 01/28/2013 1615   BUN 11.7 01/24/2013 1319   CREATININE 0.32* 01/28/2013 1615   CREATININE 0.7 01/24/2013 1319      Component Value Date/Time   CALCIUM 9.7 01/28/2013 1615    CALCIUM 10.7* 01/24/2013 1319   ALKPHOS 81 01/28/2013 1615   ALKPHOS 81 01/24/2013 1319   AST 22 01/28/2013 1615   AST 9 01/24/2013 1319   ALT 33 01/28/2013 1615   ALT 12 01/24/2013 1319   BILITOT 0.3 01/28/2013 1615   BILITOT 0.43 01/24/2013 1319       RADIOGRAPHIC STUDIES:  Dg Chest 2 View Within Previous 72 Hours.  Films Obtained On Friday Are Acceptable For Monday And Tuesday Cases  11/18/2012   CLINICAL DATA:  67 year old female preoperative study. Lung mass. Colon cancer.  EXAM: CHEST  2 VIEW  COMPARISON:  PET-CT 11/02/2012 and earlier.  FINDINGS: 5 cm right lower lobe lung mass re- identified. Radiographically this appears not significantly changed since 10/25/2012. The 6 mm cavitary right upper lobe pulmonary nodule is faint but visible. Stable lung volumes. Stable cardiac size and mediastinal contours. The left lung is clear. No pneumothorax or effusion. Stable visualized osseous structures. Chronic T12 compression fracture. Flowing thoracic osteophytes.  IMPRESSION: Known right lung lesions. No new cardiopulmonary abnormality.   Electronically Signed   By: Augusto Gamble   On: 11/18/2012 15:53   Nm Pet Image Initial (pi) Skull Base To Thigh  11/02/2012   *RADIOLOGY REPORT*  Clinical Data:  Initial treatment strategy for rectal cancer. Circumferential rectal cancer 4 cm from the anal verge.  4 cm right lung mass.  NUCLEAR MEDICINE PET WHOLE BODY  Fasting Blood Glucose:  123  Technique:  19.1 mCi F-18 FDG was injected intravenously. CT data was obtained and used for attenuation correction and anatomic localization only.  (This was not acquired as a diagnostic CT examination.) Additional exam technical data entered on technologist worksheet.  Comparison:  CT scan from 10/25/2012  Findings:  Head/Neck:   No hypermetabolic lymph nodes in the neck.  Chest:  The previously characterized 5 cm right lower lobe pulmonary mass is hypermetabolic with SUV max = 8.  No evidence for hypermetabolic  lymphadenopathy in the mediastinum or either hilum. No other hypermetabolic lung lesions are evident although the patient does have bilateral small pulmonary parenchymal nodules (see posterior right upper lobe on image 73 and posterior right lower lobe on image  78).  Both of these small nodules are below the accepted size threshold for reliable resolution on PET imaging.  Abdomen/Pelvis:  No abnormal hypermetabolism is identified within the liver.  No hypermetabolic lymphadenopathy in the abdomen.  There is a prominent amount of physiologic F D G uptake in the colon, but this is associated with hypermetabolic uptake in a region of circumferential rectal wall thickening.  SUV max = 9 for the rectal hypermetabolism.  Skeleton:  No focal hypermetabolic activity to suggest skeletal metastasis.  IMPRESSION: Hypermetabolism in the area of apparent circumferential rectal wall thickening associate with hypermetabolic 5 cm right lower lobe pulmonary mass.  6 mm cavitary pulmonary nodule the posterior right upper lobe is not hypermetabolic on PET imaging, but is below the accepted size threshold for reliable resolution on PET.  A second tiny right lower lobe nodule is also noted.   Original Report Authenticated By: Kennith Center, M.D.   Dg Fluoro Guide Cv Line-no Report  11/21/2012   CLINICAL DATA: Done in OR # 10,Intraoperative insertion port a cath   FLOURO GUIDE CV LINE  Fluoroscopy was utilized by the requesting physician.  No radiographic  interpretation.     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 She is in oligo metastases patient is very interested in being aggressive with her disease.She will receive a total of 6 cycles of FOLFOX 6. This will then be followed by radiation with radiosensitizing Xeloda. She will then finish up another 6 cycles of FOLFOX 6.  #2 patient's course was complicated after 2 cycles with hospitalization for pneumonia/loculated  pleural effusion she did require VATs procedure with drainage of the pleural effusion. Her cultures did reveal strep. She has been on antibiotics. She is completed up and 10 now. She seems to be doing much better.  #3 patient is status post cycle 3 of FOLFOX. Overall she tolerated it well.  #4 patient was seen by Dr. Renold Genta at [facility]. He has recommended the possibility of doing the colon surgery first. We will try to coordinate this. Certainly we could after the fifth cycle of chemotherapy she is going to have a proctoscope performed at [facility]. After that she certainly could begin radiation therapy with radiosensitizing Xeloda. When she completes this she will proceed with her surgery. Once she recovers from it we certainly could get her started back on radiation therapy  PLAN:  #1 patient will proceed with cycle 4 of FOLFOX today.  #2 I will plan on getting a CT of the chest performed in about a week or 2. Patient is also set up to get an MRI we will try to postpone this so that she can have both of these studies together.  #3 patient will be seen back in one week's time for followup and labs.  All questions were answered. The patient knows to call the clinic with any problems, questions or concerns. We can certainly see the patient much sooner if necessary.  I spent 25 minutes counseling the patient face to face. The total time spent in the appointment was 30 minutes.  Drue Second, MD Medical/Oncology [facility] [phone] (beeper) [phone] (Office)

## 2013-01-31 NOTE — Telephone Encounter (Signed)
Per staff message and POF I have scheduled appts.  [name]  

## 2013-01-31 NOTE — Telephone Encounter (Signed)
Per staff message and POF I have scheduled appts. I have notified the scheduler that the first available appt for 11/18 scheduled. S [name]

## 2013-02-01 ENCOUNTER — Other Ambulatory Visit: Payer: 59

## 2013-02-01 ENCOUNTER — Telehealth: Payer: Self-pay | Admitting: Radiation Oncology

## 2013-02-01 ENCOUNTER — Ambulatory Visit (HOSPITAL_COMMUNITY): Payer: 59

## 2013-02-01 ENCOUNTER — Other Ambulatory Visit: Payer: Self-pay | Admitting: Emergency Medicine

## 2013-02-01 NOTE — Telephone Encounter (Signed)
Received call from Sharyl Nimrod, RN in medical oncology reference this patient. Sharyl Nimrod, RN reports she received a message from the patient's sister concerning MRI scheduled for today. Noted MRI had been cancelled. Darryl Nestle phoned patient at home. Talbert Forest reports the patient decided against having a baseline MRI after conversing with Dr. Welton Flakes. Informed Dr. Kathrynn Running of this finding.

## 2013-02-02 ENCOUNTER — Telehealth: Payer: Self-pay | Admitting: *Deleted

## 2013-02-02 ENCOUNTER — Other Ambulatory Visit: Payer: Self-pay

## 2013-02-02 ENCOUNTER — Ambulatory Visit (HOSPITAL_BASED_OUTPATIENT_CLINIC_OR_DEPARTMENT_OTHER): Payer: 59

## 2013-02-02 VITALS — BP 139/69 | HR 113 | Temp 97.9°F | Resp 18

## 2013-02-02 DIAGNOSIS — Z452 Encounter for adjustment and management of vascular access device: Secondary | ICD-10-CM

## 2013-02-02 DIAGNOSIS — C20 Malignant neoplasm of rectum: Secondary | ICD-10-CM

## 2013-02-02 DIAGNOSIS — C7800 Secondary malignant neoplasm of unspecified lung: Secondary | ICD-10-CM

## 2013-02-02 LAB — AFB CULTURE WITH SMEAR (NOT AT ARMC)
Acid Fast Smear: NONE SEEN
Acid Fast Smear: NONE SEEN

## 2013-02-02 MED ORDER — HEPARIN SOD (PORK) LOCK FLUSH 100 UNIT/ML IV SOLN
500.0000 [IU] | Freq: Once | INTRAVENOUS | Status: AC | PRN
  Administered 2013-02-02: 500 [IU]
  Filled 2013-02-02: qty 5

## 2013-02-02 MED ORDER — SODIUM CHLORIDE 0.9 % IJ SOLN
10.0000 mL | INTRAMUSCULAR | Status: DC | PRN
  Administered 2013-02-02: 10 mL
  Filled 2013-02-02: qty 10

## 2013-02-02 NOTE — Telephone Encounter (Signed)
Called patient to inform of test on 02-09-13- arrival time- 12:30 pm at [facility], spoke with patient and she is aware of this test.

## 2013-02-02 NOTE — ED Provider Notes (Signed)
Pt seen by Dr Loretha Stapler, his note already in epic.   Suzi Roots, MD 02/02/13 [phone]

## 2013-02-07 ENCOUNTER — Other Ambulatory Visit (HOSPITAL_BASED_OUTPATIENT_CLINIC_OR_DEPARTMENT_OTHER): Payer: 59 | Admitting: Lab

## 2013-02-07 ENCOUNTER — Ambulatory Visit (HOSPITAL_BASED_OUTPATIENT_CLINIC_OR_DEPARTMENT_OTHER): Payer: 59 | Admitting: Oncology

## 2013-02-07 VITALS — BP 143/83 | HR 101 | Temp 98.2°F | Resp 18 | Ht 64.0 in | Wt 119.4 lb

## 2013-02-07 DIAGNOSIS — D649 Anemia, unspecified: Secondary | ICD-10-CM

## 2013-02-07 DIAGNOSIS — C20 Malignant neoplasm of rectum: Secondary | ICD-10-CM

## 2013-02-07 DIAGNOSIS — C7800 Secondary malignant neoplasm of unspecified lung: Secondary | ICD-10-CM

## 2013-02-07 DIAGNOSIS — R918 Other nonspecific abnormal finding of lung field: Secondary | ICD-10-CM

## 2013-02-07 LAB — CBC WITH DIFFERENTIAL/PLATELET
BASO%: 0.2 % (ref 0.0–2.0)
EOS%: 0 % (ref 0.0–7.0)
Eosinophils Absolute: 0 10*3/uL (ref 0.0–0.5)
HGB: 10.9 g/dL — ABNORMAL LOW (ref 11.6–15.9)
LYMPH%: 16.4 % (ref 14.0–49.7)
MONO#: 0.1 10*3/uL (ref 0.1–0.9)
MONO%: 1 % (ref 0.0–14.0)
NEUT#: 5.7 10*3/uL (ref 1.5–6.5)
RBC: 4.23 10*6/uL (ref 3.70–5.45)
RDW: 21.3 % — ABNORMAL HIGH (ref 11.2–14.5)
WBC: 6.9 10*3/uL (ref 3.9–10.3)

## 2013-02-07 LAB — COMPREHENSIVE METABOLIC PANEL (CC13)
ALT: 69 U/L — ABNORMAL HIGH (ref 0–55)
Albumin: 3.5 g/dL (ref 3.5–5.0)
Alkaline Phosphatase: 65 U/L (ref 40–150)
CO2: 20 mEq/L — ABNORMAL LOW (ref 22–29)
Glucose: 286 mg/dl — ABNORMAL HIGH (ref 70–140)
Potassium: 4.5 mEq/L (ref 3.5–5.1)
Sodium: 131 mEq/L — ABNORMAL LOW (ref 136–145)
Total Protein: 7.3 g/dL (ref 6.4–8.3)

## 2013-02-07 NOTE — Progress Notes (Signed)
OFFICE PROGRESS NOTE  CC  Anne Harp, MD [address] [place] [address]  DIAGNOSIS: 67 year old female with new diagnosis of rectal cancer with lung metastasis   PRIOR THERAPY: #17/27/2014 with acute episode of bright red blood per rectum and large clot formation, prompting her to be evaluated at the ED. Of note,colonoscopy prior to this admission had been performed on 03/04/10(screening) which showed three polyps ,largest 8 mm, removed, as well as moderate diverticulosis in the sigmoid colon, otherwise normal exam . On presentation, she had Positive Guaiac and a rectal mass had been palpated on exam. On 7/29 she underwent colonoscopy (Dr. Marina Goodell) which revealed a bulky, circumferential, friable mass lesion in the rectum of about 4 cm above the dentate line, subtotally obstructing and would not permit passage of the colonoscope proximally. Multiple biopsies were taken.  #2 patient's pathology revealed consistent with adenocarcinoma with primary from rectal. Lung biopsy was performed also that revealed metastatic disease.  #3 patient was seen by Dr. Dorris Fetch for possible resection of the lung metastasis. He recommended we proceed with chemotherapy first to see if we could do both lung metastases and with eventual resection.  #4 patient is recommended to proceed with palliative chemotherapy consisting of FOLFOX. We will hold Avastin every 2 rectal bleeding.    CURRENT THERAPY:  Status post cycle 4 of FOLFOX 6  INTERVAL HISTORY: Anne [name] 67 y.o. female returns for followup visit . She has now completed 4 cycles of FOLFOX 6. She seems to have tolerated it quite well. Her blood count is stable. She denied having any nausea or vomiting. She again experienced a cough that she experiences for the first 3 days when she has received her chemotherapy. She however did not have any fevers chills or night sweats. Patient continues to try very hard to keep but with her  nutritional needs. She is now taking 4 cans of boost along with beginning protein powder to try to get her weight. Patient is having bowel movements. She is taking MiraLax twice a day. She has a bowel movement every 4 hours or so they are small in amounts but moist inconsistency. She occasionally does note some mucus and some pink tinge. But she does not feel that there is overt blood. She is not having any abdominal pain or distention. She denies having any peripheral paresthesias any rashes. No chest pains, No shortness of breath no cough hemoptysis or hematemesis. Remainder of the 10 point review of systems is negative.  MED ICAL HISTORY: Past Medical History  Diagnosis Date  . Postmenopausal HRT (hormone replacement therapy) 07/01/2011    surgical menopause  . Hx of hyperglycemia 07/01/2011  . Hx of compression fracture of spine 1985    t12 after a  20 foot fall  . Hx of fracture of wrist     after fall   . 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 Hx of colonic polyps   . Early cataract     stoneburner   . High frequency hearing loss     kraus followed   . Diabetes mellitus without complication   . Complication of anesthesia     pt hadpanic attacks with versed, no versed  . Status post chemotherapy     completed a total of three cycles of Folfox   . Colon cancer     rectal cancer with lung metastasis  . Skin cancer     multiple  . Heart murmur, systolic  07/01/2011    innocent per Dr. Elijah Birk     ALLERGIES:  is allergic to ativan; midazolam; zolpidem tartrate; moviprep; and neomycin-bacitracin zn-polymyx.  MEDICATIONS:  Current Outpatient Prescriptions  Medication Sig Dispense Refill  . dexamethasone (DECADRON) 4 MG tablet Take 2 tablets (8 mg total) by mouth daily. To stimulate appetite.  30 tablet  6  . estradiol (MINIVELLE) 0.0375 MG/24HR Place 1 patch onto the skin 2 (two) times a week. Sundays and Thursdays      . metFORMIN (GLUCOPHAGE) 1000 MG tablet Take 1,000 mg by mouth 2  (two) times daily with a meal.      . Multiple Vitamin (MULTIVITAMIN) tablet Take 1 tablet by mouth daily. Senior      . omeprazole (PRILOSEC) 40 MG capsule Take 1 capsule (40 mg total) by mouth daily.  30 capsule  6  . ondansetron (ZOFRAN ODT) 8 MG disintegrating tablet Take 1 tablet (8 mg total) by mouth every 8 (eight) hours as needed for nausea.  30 tablet  2  . oxyCODONE-acetaminophen (PERCOCET/ROXICET) 5-325 MG per tablet Take 1-2 tablets by mouth every 4 (four) hours as needed.  80 tablet  0  . polyethylene glycol (MIRALAX / GLYCOLAX) packet Take 17 g by mouth daily. Taking in am and hs      . Probiotic Product (SOLUBLE FIBER/PROBIOTICS PO) Take 1 tablet by mouth daily.      . sitaGLIPtin (JANUVIA) 100 MG tablet Take 100 mg by mouth daily.       No current facility-administered medications for this visit.    SURGICAL HISTORY:  Past Surgical History  Procedure Laterality Date  . Tonsillectomy  1952  . Adenoidectomy  1952  . Wrist surgery  1957    left reduction  . Compression fraction  1985    T12  . Anterior cruciate ligament repair  1993    right Murphy  . Cosmetic surgery  1999-2007    holderness blepharoplasty facial rhytidectomy mastopexy abdominoplasty   . Dental implant    . Dilation and curettage of uterus  1977  . Total abdominal hysterectomy  2005    with cystocele repair   . Colonoscopy N/A 10/24/2012    Procedure: COLONOSCOPY;  Surgeon: Hilarie Fredrickson, MD;  Location: [facility] ENDOSCOPY;  Service: Endoscopy;  Laterality: N/A;  . Colonoscopy N/A 10/25/2012    Procedure: COLONOSCOPY;  Surgeon: Hilarie Fredrickson, MD;  Location: [facility] ENDOSCOPY;  Service: Endoscopy;  Laterality: N/A;  . Video bronchoscopy Bilateral 10/27/2012    Procedure: VIDEO BRONCHOSCOPY WITH FLUORO;  Surgeon: Coralyn Helling, MD;  Location: [facility] ENDOSCOPY;  Service: Endoscopy;  Laterality: Bilateral;  . Portacath placement Left 11/21/2012    Procedure: INSERTION PORT-A-CATH;  Surgeon: Loreli Slot, MD;  Location:  [facility] OR;  Service: Thoracic;  Laterality: Left;  . Video assisted thoracoscopy Right 12/21/2012    Procedure: VIDEO ASSISTED THORACOSCOPY;  Surgeon: Loreli Slot, MD;  Location: [facility] OR;  Service: Thoracic;  Laterality: Right;  . Pleural effusion drainage Right 12/21/2012    Procedure: DRAINAGE OF PLEURAL EFFUSION;  Surgeon: Loreli Slot, MD;  Location: [facility] OR;  Service: Thoracic;  Laterality: Right;  . Decortication Right 12/21/2012    Procedure: DECORTICATION;  Surgeon: Loreli Slot, MD;  Location: [facility] OR;  Service: Thoracic;  Laterality: Right;    REVIEW OF SYSTEMS:  Pertinent items are noted in HPI.   HEALTH MAINTENANCE:   PHYSICAL EXAMINATION: Blood pressure 143/83, pulse 101, temperature 98.2 F (36.8 C), temperature source Oral, resp.  rate 18, height 5\' 4"  (1.626 m), weight 119 lb 6.4 oz (54.159 kg). Body mass index is 20.48 kg/(m^2). General: Patient is a well appearing female in no acute distress HEENT: PERRLA, sclerae anicteric no conjunctival pallor, MMM Neck: supple, no palpable adenopathy Lungs: clear to auscultation bilaterally, no wheezes, rhonchi, or rales Cardiovascular: regular rate rhythm, S1, S2, no murmurs, rubs or gallops Abdomen: Soft, non-tender, non-distended, normoactive bowel sounds, no HSM Extremities: warm and well perfused, no clubbing, cyanosis, or edema Skin: No rashes or lesions Neuro: Non-focal ECOG PERFORMANCE STATUS: 0 - Asymptomatic  LABORATORY DATA: Lab Results  Component Value Date   WBC 6.9 02/07/2013   HGB 10.9* 02/07/2013   HCT 33.2* 02/07/2013   MCV 78.6* 02/07/2013   PLT 427* 02/07/2013      Chemistry      Component Value Date/Time   NA 131* 02/07/2013 1531   NA 129* 01/28/2013 1615   K 4.5 02/07/2013 1531   K 3.8 01/28/2013 1615   CL 92* 01/28/2013 1615   CO2 20* 02/07/2013 1531   CO2 22 01/28/2013 1615   BUN 11.9 02/07/2013 1531   BUN 12 01/28/2013 1615   CREATININE 0.7 02/07/2013 1531   CREATININE 0.32*  01/28/2013 1615      Component Value Date/Time   CALCIUM 10.2 02/07/2013 1531   CALCIUM 9.7 01/28/2013 1615   ALKPHOS 65 02/07/2013 1531   ALKPHOS 81 01/28/2013 1615   AST 47* 02/07/2013 1531   AST 22 01/28/2013 1615   ALT 69* 02/07/2013 1531   ALT 33 01/28/2013 1615   BILITOT 0.26 02/07/2013 1531   BILITOT 0.3 01/28/2013 1615       RADIOGRAPHIC STUDIES:  Dg Chest 2 View Within Previous 72 Hours.  Films Obtained On Friday Are Acceptable For Monday And Tuesday Cases  11/18/2012   CLINICAL DATA:  67 year old female preoperative study. Lung mass. Colon cancer.  EXAM: CHEST  2 VIEW  COMPARISON:  PET-CT 11/02/2012 and earlier.  FINDINGS: 5 cm right lower lobe lung mass re- identified. Radiographically this appears not significantly changed since 10/25/2012. The 6 mm cavitary right upper lobe pulmonary nodule is faint but visible. Stable lung volumes. Stable cardiac size and mediastinal contours. The left lung is clear. No pneumothorax or effusion. Stable visualized osseous structures. Chronic T12 compression fracture. Flowing thoracic osteophytes.  IMPRESSION: Known right lung lesions. No new cardiopulmonary abnormality.   Electronically Signed   By: Augusto Gamble   On: 11/18/2012 15:53   Nm Pet Image Initial (pi) Skull Base To Thigh  11/02/2012   *RADIOLOGY REPORT*  Clinical Data:  Initial treatment strategy for rectal cancer. Circumferential rectal cancer 4 cm from the anal verge.  4 cm right lung mass.  NUCLEAR MEDICINE PET WHOLE BODY  Fasting Blood Glucose:  123  Technique:  19.1 mCi F-18 FDG was injected intravenously. CT data was obtained and used for attenuation correction and anatomic localization only.  (This was not acquired as a diagnostic CT examination.) Additional exam technical data entered on technologist worksheet.  Comparison:  CT scan from 10/25/2012  Findings:  Head/Neck:   No hypermetabolic lymph nodes in the neck.  Chest:  The previously characterized 5 cm right lower lobe pulmonary mass  is hypermetabolic with SUV max = 8.  No evidence for hypermetabolic lymphadenopathy in the mediastinum or either hilum. No other hypermetabolic lung lesions are evident although the patient does have bilateral small pulmonary parenchymal nodules (see posterior right upper lobe on image 73 and posterior right lower  lobe on image 78).  Both of these small nodules are below the accepted size threshold for reliable resolution on PET imaging.  Abdomen/Pelvis:  No abnormal hypermetabolism is identified within the liver.  No hypermetabolic lymphadenopathy in the abdomen.  There is a prominent amount of physiologic F D G uptake in the colon, but this is associated with hypermetabolic uptake in a region of circumferential rectal wall thickening.  SUV max = 9 for the rectal hypermetabolism.  Skeleton:  No focal hypermetabolic activity to suggest skeletal metastasis.  IMPRESSION: Hypermetabolism in the area of apparent circumferential rectal wall thickening associate with hypermetabolic 5 cm right lower lobe pulmonary mass.  6 mm cavitary pulmonary nodule the posterior right upper lobe is not hypermetabolic on PET imaging, but is below the accepted size threshold for reliable resolution on PET.  A second tiny right lower lobe nodule is also noted.   Original Report Authenticated By: Kennith Center, M.D.   Dg Fluoro Guide Cv Line-no Report  11/21/2012   CLINICAL DATA: Done in OR # 10,Intraoperative insertion port a cath   FLOURO GUIDE CV LINE  Fluoroscopy was utilized by the requesting physician.  No radiographic  interpretation.     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 She is in oligo metastases patient is very interested in being aggressive with her disease.She will receive a total of 6 cycles of FOLFOX 6. This will then be followed by radiation with radiosensitizing Xeloda. She will then finish up another 6 cycles of FOLFOX 6.  #2 patient's course was  complicated after 2 cycles with hospitalization for pneumonia/loculated pleural effusion she did require VATs procedure with drainage of the pleural effusion. Her cultures did reveal strep. She has been on antibiotics. She is completed up and 10 now. She seems to be doing much better.  #3 patient is status post cycle 4 of FOLFOX. Overall she tolerated it well.  #4 patient was seen by Dr. Renold Genta at [facility] He has recommended the possibility of doing the colon surgery first. We will try to coordinate this. Certainly we could after the fifth cycle of chemotherapy she is going to have a proctoscope performed at [facility] After that she certainly could begin radiation therapy with radiosensitizing Xeloda. When she completes this she will proceed with her surgery. Once she recovers from it we certainly could get her started back on radiation therapy  PLAN:  #1 post cycle 4 of FOLFOX 6. and is doing well. She will return on 02/13/2013 for cycle #5 of her treatment.  #2 patient is going to be going to probably to get a proctoscope on 02/16/2013.  #3 she is having CT of the chest to evaluate response this week. She will also have MRI of the brain to rule out any occult metastasis from the rectal cancer.  #4 patient will be seen back in one week's time for followup and cycle 5 of FOLFOX 6  All questions were answered. The patient knows to call the clinic with any problems, questions or concerns. We can certainly see the patient much sooner if necessary.  I spent 25 minutes counseling the patient face to face. The total time spent in the appointment was 30 minutes.  Drue Second, MD Medical/Oncology [facility] [phone] (beeper) [phone] (Office)

## 2013-02-08 ENCOUNTER — Telehealth: Payer: Self-pay | Admitting: *Deleted

## 2013-02-08 NOTE — Telephone Encounter (Signed)
Per MD, notified pt sodium is low, pt to increase salt in her diet, (eat potato chip or such). Pt verbalized understanding no further concerns.

## 2013-02-08 NOTE — Telephone Encounter (Signed)
Message copied by Cooper Render on Wed Feb 08, 2013  4:41 PM ------      Message from: Victorino December      Created: Tue Feb 07, 2013  6:05 PM       Please let patient know her sodium is low            She should increase salt in her diet (eat Potato chips or such)      ----- Message -----         From: [facility] Interface         Sent: 02/07/2013   3:43 PM           To: Victorino December, MD                   ------

## 2013-02-09 ENCOUNTER — Ambulatory Visit
Admission: RE | Admit: 2013-02-09 | Discharge: 2013-02-09 | Disposition: A | Discharge status: Home / Self Care | Payer: 59 | Source: Ambulatory Visit | Attending: Radiation Oncology | Admitting: Radiation Oncology

## 2013-02-09 DIAGNOSIS — C7800 Secondary malignant neoplasm of unspecified lung: Secondary | ICD-10-CM

## 2013-02-09 DIAGNOSIS — C20 Malignant neoplasm of rectum: Secondary | ICD-10-CM

## 2013-02-09 MED ORDER — GADOBENATE DIMEGLUMINE 529 MG/ML IV SOLN
12.0000 mL | Freq: Once | INTRAVENOUS | Status: AC | PRN
  Administered 2013-02-09: 12 mL via INTRAVENOUS

## 2013-02-09 NOTE — Progress Notes (Signed)
Quick Note:  Please call patient with normal result.  Thanks. MM ______ 

## 2013-02-10 ENCOUNTER — Telehealth: Payer: Self-pay | Admitting: Radiation Oncology

## 2013-02-10 ENCOUNTER — Ambulatory Visit (HOSPITAL_COMMUNITY)
Admission: RE | Admit: 2013-02-10 | Discharge: 2013-02-10 | Disposition: A | Discharge status: Home / Self Care | Payer: 59 | Source: Ambulatory Visit | Attending: Oncology | Admitting: Oncology

## 2013-02-10 DIAGNOSIS — C20 Malignant neoplasm of rectum: Secondary | ICD-10-CM | POA: Insufficient documentation

## 2013-02-10 DIAGNOSIS — J984 Other disorders of lung: Secondary | ICD-10-CM | POA: Insufficient documentation

## 2013-02-10 DIAGNOSIS — M439 Deforming dorsopathy, unspecified: Secondary | ICD-10-CM | POA: Insufficient documentation

## 2013-02-10 DIAGNOSIS — C7800 Secondary malignant neoplasm of unspecified lung: Secondary | ICD-10-CM | POA: Insufficient documentation

## 2013-02-10 DIAGNOSIS — R918 Other nonspecific abnormal finding of lung field: Secondary | ICD-10-CM | POA: Insufficient documentation

## 2013-02-10 LAB — AFB CULTURE WITH SMEAR (NOT AT ARMC)

## 2013-02-10 MED ORDER — IOHEXOL 300 MG/ML  SOLN
80.0000 mL | Freq: Once | INTRAMUSCULAR | Status: AC | PRN
  Administered 2013-02-10: 80 mL via INTRAVENOUS

## 2013-02-10 NOTE — Telephone Encounter (Signed)
Phoned patient at home. Spoke with patient's sister, Sissy. Per Dr. Broadus John order explained there is no evidence of metastatic disease to the brain. Sissy explains that Dr. Kathrynn Running sent them a message via myChart last night. Sissy explains "it was nice to get some good news for a change." Encouraged to call with future needs.

## 2013-02-13 ENCOUNTER — Encounter: Payer: Self-pay | Admitting: Oncology

## 2013-02-13 ENCOUNTER — Other Ambulatory Visit (HOSPITAL_BASED_OUTPATIENT_CLINIC_OR_DEPARTMENT_OTHER): Payer: 59 | Admitting: Lab

## 2013-02-13 ENCOUNTER — Ambulatory Visit (HOSPITAL_BASED_OUTPATIENT_CLINIC_OR_DEPARTMENT_OTHER): Payer: 59

## 2013-02-13 ENCOUNTER — Ambulatory Visit (HOSPITAL_BASED_OUTPATIENT_CLINIC_OR_DEPARTMENT_OTHER): Payer: 59 | Admitting: Oncology

## 2013-02-13 ENCOUNTER — Telehealth: Payer: Self-pay | Admitting: Oncology

## 2013-02-13 VITALS — BP 151/82 | HR 92 | Temp 97.6°F | Resp 18 | Ht 64.0 in | Wt 121.0 lb

## 2013-02-13 DIAGNOSIS — C7800 Secondary malignant neoplasm of unspecified lung: Secondary | ICD-10-CM

## 2013-02-13 DIAGNOSIS — C20 Malignant neoplasm of rectum: Secondary | ICD-10-CM

## 2013-02-13 DIAGNOSIS — G8918 Other acute postprocedural pain: Secondary | ICD-10-CM

## 2013-02-13 DIAGNOSIS — Z5111 Encounter for antineoplastic chemotherapy: Secondary | ICD-10-CM

## 2013-02-13 LAB — CBC WITH DIFFERENTIAL/PLATELET
BASO%: 0.4 % (ref 0.0–2.0)
EOS%: 0.4 % (ref 0.0–7.0)
LYMPH%: 48.2 % (ref 14.0–49.7)
MCH: 25.7 pg (ref 25.1–34.0)
MCHC: 32 g/dL (ref 31.5–36.0)
MONO#: 0.6 10*3/uL (ref 0.1–0.9)
MONO%: 8.8 % (ref 0.0–14.0)
NEUT%: 42.2 % (ref 38.4–76.8)
Platelets: 215 10*3/uL (ref 145–400)
RBC: 4.16 10*6/uL (ref 3.70–5.45)
RDW: 21.6 % — ABNORMAL HIGH (ref 11.2–14.5)
WBC: 6.6 10*3/uL (ref 3.9–10.3)

## 2013-02-13 LAB — COMPREHENSIVE METABOLIC PANEL (CC13)
ALT: 65 U/L — ABNORMAL HIGH (ref 0–55)
AST: 25 U/L (ref 5–34)
Alkaline Phosphatase: 58 U/L (ref 40–150)
CO2: 26 mEq/L (ref 22–29)
Creatinine: 0.7 mg/dL (ref 0.6–1.1)
Potassium: 4.4 mEq/L (ref 3.5–5.1)
Sodium: 136 mEq/L (ref 136–145)
Total Bilirubin: 0.42 mg/dL (ref 0.20–1.20)
Total Protein: 6.9 g/dL (ref 6.4–8.3)

## 2013-02-13 MED ORDER — DEXTROSE 5 % IV SOLN
Freq: Once | INTRAVENOUS | Status: AC
  Administered 2013-02-13: 10:00:00 via INTRAVENOUS

## 2013-02-13 MED ORDER — ONDANSETRON 8 MG/NS 50 ML IVPB
INTRAVENOUS | Status: AC
  Filled 2013-02-13: qty 8

## 2013-02-13 MED ORDER — SODIUM CHLORIDE 0.9 % IV SOLN
2400.0000 mg/m2 | INTRAVENOUS | Status: DC
  Administered 2013-02-13: 4100 mg via INTRAVENOUS
  Filled 2013-02-13: qty 82

## 2013-02-13 MED ORDER — OXALIPLATIN CHEMO INJECTION 100 MG/20ML
85.0000 mg/m2 | Freq: Once | INTRAVENOUS | Status: AC
  Administered 2013-02-13: 145 mg via INTRAVENOUS
  Filled 2013-02-13: qty 29

## 2013-02-13 MED ORDER — ONDANSETRON 8 MG/50ML IVPB (CHCC)
8.0000 mg | Freq: Once | INTRAVENOUS | Status: AC
  Administered 2013-02-13: 8 mg via INTRAVENOUS

## 2013-02-13 MED ORDER — LEUCOVORIN CALCIUM INJECTION 350 MG
650.0000 mg | Freq: Once | INTRAVENOUS | Status: AC
  Administered 2013-02-13: 650 mg via INTRAVENOUS
  Filled 2013-02-13: qty 32.5

## 2013-02-13 MED ORDER — FLUOROURACIL CHEMO INJECTION 2.5 GM/50ML
400.0000 mg/m2 | Freq: Once | INTRAVENOUS | Status: AC
  Administered 2013-02-13: 650 mg via INTRAVENOUS
  Filled 2013-02-13: qty 13

## 2013-02-13 MED ORDER — DEXAMETHASONE SODIUM PHOSPHATE 10 MG/ML IJ SOLN
INTRAMUSCULAR | Status: AC
  Filled 2013-02-13: qty 1

## 2013-02-13 MED ORDER — DEXAMETHASONE SODIUM PHOSPHATE 10 MG/ML IJ SOLN
10.0000 mg | Freq: Once | INTRAMUSCULAR | Status: AC
  Administered 2013-02-13: 10 mg via INTRAVENOUS

## 2013-02-13 MED ORDER — OXYCODONE-ACETAMINOPHEN 5-325 MG PO TABS: 1.0000 | ORAL_TABLET | ORAL | Status: DC | PRN

## 2013-02-13 NOTE — Progress Notes (Signed)
OFFICE PROGRESS NOTE  CC  Anne Harp, MD [address] [place] [address]  DIAGNOSIS: 67 year old female with new diagnosis of rectal cancer with lung metastasis   PRIOR THERAPY: #17/27/2014 with acute episode of bright red blood per rectum and large clot formation, prompting her to be evaluated at the ED. Of note,colonoscopy prior to this admission had been performed on 03/04/10(screening) which showed three polyps ,largest 8 mm, removed, as well as moderate diverticulosis in the sigmoid colon, otherwise normal exam . On presentation, she had Positive Guaiac and a rectal mass had been palpated on exam. On 7/29 she underwent colonoscopy (Dr. Marina Goodell) which revealed a bulky, circumferential, friable mass lesion in the rectum of about 4 cm above the dentate line, subtotally obstructing and would not permit passage of the colonoscope proximally. Multiple biopsies were taken.  #2 patient's pathology revealed consistent with adenocarcinoma with primary from rectal. Lung biopsy was performed also that revealed metastatic disease.  #3 patient was seen by Dr. Dorris Fetch for possible resection of the lung metastasis. He recommended we proceed with chemotherapy first to see if we could do both lung metastases and with eventual resection.  #4 patient is recommended to proceed with palliative chemotherapy consisting of FOLFOX. We will hold Avastin every 2 rectal bleeding.    CURRENT THERAPY:  Cycle 5 of FOLFOX 6  INTERVAL HISTORY: BRINLEIGH TEW 67 y.o. female returns for followup visit . She is now here for cycle 5 of FOLFOX 6 Her blood count is stable. She denied having any nausea or vomiting. Clinically patient did well with this cycle 4 of her treatment. She did not develop a cough this time. She has been trying to eat as much as she possibly can. She is now 121 pounds. She is denying any nausea vomiting fevers chills night sweats. She did have a CT of the chest performed there  is slight progression of the lung lesion but could be due to differences in imaging. We will continue to follow this closely. The right pleural effusion has resolved completely. Again denies any headaches double vision blurring of vision no abdominal pain. She is having her bowel movements. She still continues to note some mucus and blood-tinged mucus as well. Remainder of the 10 point review of systems is negative.  MED ICAL HISTORY: Past Medical History  Diagnosis Date  . Postmenopausal HRT (hormone replacement therapy) 07/01/2011    surgical menopause  . Hx of hyperglycemia 07/01/2011  . Hx of compression fracture of spine 1985    t12 after a  20 foot fall  . Hx of fracture of wrist     after fall   . 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 Hx of colonic polyps   . Early cataract     stoneburner   . High frequency hearing loss     kraus followed   . Diabetes mellitus without complication   . Complication of anesthesia     pt hadpanic attacks with versed, no versed  . Status post chemotherapy     completed a total of three cycles of Folfox   . Colon cancer     rectal cancer with lung metastasis  . Skin cancer     multiple  . Heart murmur, systolic 07/01/2011    innocent per Dr. Elijah Birk     ALLERGIES:  is allergic to ativan; midazolam; zolpidem tartrate; moviprep; and neomycin-bacitracin zn-polymyx.  MEDICATIONS:  Current Outpatient Prescriptions  Medication Sig Dispense  Refill  . dexamethasone (DECADRON) 4 MG tablet Take 2 tablets (8 mg total) by mouth daily. To stimulate appetite.  30 tablet  6  . estradiol (MINIVELLE) 0.0375 MG/24HR Place 1 patch onto the skin 2 (two) times a week. Sundays and Thursdays      . metFORMIN (GLUCOPHAGE) 1000 MG tablet Take 1,000 mg by mouth 2 (two) times daily with a meal.      . Multiple Vitamin (MULTIVITAMIN) tablet Take 1 tablet by mouth daily. Senior      . omeprazole (PRILOSEC) 40 MG capsule Take 1 capsule (40 mg total) by mouth daily.  30  capsule  6  . ondansetron (ZOFRAN ODT) 8 MG disintegrating tablet Take 1 tablet (8 mg total) by mouth every 8 (eight) hours as needed for nausea.  30 tablet  2  . oxyCODONE-acetaminophen (PERCOCET/ROXICET) 5-325 MG per tablet Take 1-2 tablets by mouth every 4 (four) hours as needed.  80 tablet  0  . polyethylene glycol (MIRALAX / GLYCOLAX) packet Take 17 g by mouth daily. Taking in am and hs      . Probiotic Product (SOLUBLE FIBER/PROBIOTICS PO) Take 1 tablet by mouth daily.      . sitaGLIPtin (JANUVIA) 100 MG tablet Take 100 mg by mouth daily.       No current facility-administered medications for this visit.    SURGICAL HISTORY:  Past Surgical History  Procedure Laterality Date  . Tonsillectomy  1952  . Adenoidectomy  1952  . Wrist surgery  1957    left reduction  . Compression fraction  1985    T12  . Anterior cruciate ligament repair  1993    right Murphy  . Cosmetic surgery  1999-2007    holderness blepharoplasty facial rhytidectomy mastopexy abdominoplasty   . Dental implant    . Dilation and curettage of uterus  1977  . Total abdominal hysterectomy  2005    with cystocele repair   . Colonoscopy N/A 10/24/2012    Procedure: COLONOSCOPY;  Surgeon: Hilarie Fredrickson, MD;  Location: [facility] ENDOSCOPY;  Service: Endoscopy;  Laterality: N/A;  . Colonoscopy N/A 10/25/2012    Procedure: COLONOSCOPY;  Surgeon: Hilarie Fredrickson, MD;  Location: [facility] ENDOSCOPY;  Service: Endoscopy;  Laterality: N/A;  . Video bronchoscopy Bilateral 10/27/2012    Procedure: VIDEO BRONCHOSCOPY WITH FLUORO;  Surgeon: Coralyn Helling, MD;  Location: [facility] ENDOSCOPY;  Service: Endoscopy;  Laterality: Bilateral;  . Portacath placement Left 11/21/2012    Procedure: INSERTION PORT-A-CATH;  Surgeon: Loreli Slot, MD;  Location: [facility] OR;  Service: Thoracic;  Laterality: Left;  . Video assisted thoracoscopy Right 12/21/2012    Procedure: VIDEO ASSISTED THORACOSCOPY;  Surgeon: Loreli Slot, MD;  Location: [facility] OR;  Service:  Thoracic;  Laterality: Right;  . Pleural effusion drainage Right 12/21/2012    Procedure: DRAINAGE OF PLEURAL EFFUSION;  Surgeon: Loreli Slot, MD;  Location: [facility] OR;  Service: Thoracic;  Laterality: Right;  . Decortication Right 12/21/2012    Procedure: DECORTICATION;  Surgeon: Loreli Slot, MD;  Location: [facility] OR;  Service: Thoracic;  Laterality: Right;    REVIEW OF SYSTEMS:  Pertinent items are noted in HPI.   HEALTH MAINTENANCE:   PHYSICAL EXAMINATION: Blood pressure 151/82, pulse 92, temperature 97.6 F (36.4 C), temperature source Oral, resp. rate 18, height 5\' 4"  (1.626 m), weight 121 lb (54.885 kg). Body mass index is 20.76 kg/(m^2). General: Patient is a well appearing female in no acute distress HEENT: PERRLA, sclerae anicteric no conjunctival  pallor, MMM Neck: supple, no palpable adenopathy Lungs: clear to auscultation bilaterally, no wheezes, rhonchi, or rales Cardiovascular: regular rate rhythm, S1, S2, no murmurs, rubs or gallops Abdomen: Soft, non-tender, non-distended, normoactive bowel sounds, no HSM Extremities: warm and well perfused, no clubbing, cyanosis, or edema Skin: No rashes or lesions Neuro: Non-focal ECOG PERFORMANCE STATUS: 0 - Asymptomatic  LABORATORY DATA: Lab Results  Component Value Date   WBC 6.6 02/13/2013   HGB 10.7* 02/13/2013   HCT 33.4* 02/13/2013   MCV 80.3 02/13/2013   PLT 215 02/13/2013      Chemistry      Component Value Date/Time   NA 136 02/13/2013 0755   NA 129* 01/28/2013 1615   K 4.4 02/13/2013 0755   K 3.8 01/28/2013 1615   CL 92* 01/28/2013 1615   CO2 26 02/13/2013 0755   CO2 22 01/28/2013 1615   BUN 10.5 02/13/2013 0755   BUN 12 01/28/2013 1615   CREATININE 0.7 02/13/2013 0755   CREATININE 0.32* 01/28/2013 1615      Component Value Date/Time   CALCIUM 10.4 02/13/2013 0755   CALCIUM 9.7 01/28/2013 1615   ALKPHOS 58 02/13/2013 0755   ALKPHOS 81 01/28/2013 1615   AST 25 02/13/2013 0755   AST 22 01/28/2013 1615    ALT 65* 02/13/2013 0755   ALT 33 01/28/2013 1615   BILITOT 0.42 02/13/2013 0755   BILITOT 0.3 01/28/2013 1615       RADIOGRAPHIC STUDIES:  Dg Chest 2 View Within Previous 72 Hours.  Films Obtained On Friday Are Acceptable For Monday And Tuesday Cases  11/18/2012   CLINICAL DATA:  67 year old female preoperative study. Lung mass. Colon cancer.  EXAM: CHEST  2 VIEW  COMPARISON:  PET-CT 11/02/2012 and earlier.  FINDINGS: 5 cm right lower lobe lung mass re- identified. Radiographically this appears not significantly changed since 10/25/2012. The 6 mm cavitary right upper lobe pulmonary nodule is faint but visible. Stable lung volumes. Stable cardiac size and mediastinal contours. The left lung is clear. No pneumothorax or effusion. Stable visualized osseous structures. Chronic T12 compression fracture. Flowing thoracic osteophytes.  IMPRESSION: Known right lung lesions. No new cardiopulmonary abnormality.   Electronically Signed   By: Augusto Gamble   On: 11/18/2012 15:53   Nm Pet Image Initial (pi) Skull Base To Thigh  11/02/2012   *RADIOLOGY REPORT*  Clinical Data:  Initial treatment strategy for rectal cancer. Circumferential rectal cancer 4 cm from the anal verge.  4 cm right lung mass.  NUCLEAR MEDICINE PET WHOLE BODY  Fasting Blood Glucose:  123  Technique:  19.1 mCi F-18 FDG was injected intravenously. CT data was obtained and used for attenuation correction and anatomic localization only.  (This was not acquired as a diagnostic CT examination.) Additional exam technical data entered on technologist worksheet.  Comparison:  CT scan from 10/25/2012  Findings:  Head/Neck:   No hypermetabolic lymph nodes in the neck.  Chest:  The previously characterized 5 cm right lower lobe pulmonary mass is hypermetabolic with SUV max = 8.  No evidence for hypermetabolic lymphadenopathy in the mediastinum or either hilum. No other hypermetabolic lung lesions are evident although the patient does have bilateral small  pulmonary parenchymal nodules (see posterior right upper lobe on image 73 and posterior right lower lobe on image 78).  Both of these small nodules are below the accepted size threshold for reliable resolution on PET imaging.  Abdomen/Pelvis:  No abnormal hypermetabolism is identified within the liver.  No hypermetabolic lymphadenopathy  in the abdomen.  There is a prominent amount of physiologic F D G uptake in the colon, but this is associated with hypermetabolic uptake in a region of circumferential rectal wall thickening.  SUV max = 9 for the rectal hypermetabolism.  Skeleton:  No focal hypermetabolic activity to suggest skeletal metastasis.  IMPRESSION: Hypermetabolism in the area of apparent circumferential rectal wall thickening associate with hypermetabolic 5 cm right lower lobe pulmonary mass.  6 mm cavitary pulmonary nodule the posterior right upper lobe is not hypermetabolic on PET imaging, but is below the accepted size threshold for reliable resolution on PET.  A second tiny right lower lobe nodule is also noted.   Original Report Authenticated By: Kennith Center, M.D.   Dg Fluoro Guide Cv Line-no Report  11/21/2012   CLINICAL DATA: Done in OR # 10,Intraoperative insertion port a cath   FLOURO GUIDE CV LINE  Fluoroscopy was utilized by the requesting physician.  No radiographic  interpretation.     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 She is in oligo metastases patient is very interested in being aggressive with her disease.She will receive a total of 6 cycles of FOLFOX 6. This will then be followed by radiation with radiosensitizing Xeloda. She will then finish up another 6 cycles of FOLFOX 6.  #2 patient's course was complicated after 2 cycles with hospitalization for pneumonia/loculated pleural effusion she did require VATs procedure with drainage of the pleural effusion. Her cultures did reveal strep. She has been on antibiotics. She  is completed up and 10 now. She seems to be doing much better.  #3 patient is status post cycle 4 of FOLFOX. Overall she tolerated it well.  #4 patient was seen by Dr. Renold Genta at [facility]. He has recommended the possibility of doing the colon surgery first. We will try to coordinate this. Certainly we could after the fifth cycle of chemotherapy she is going to have a proctoscope performed at [facility]. After that she certainly could begin radiation therapy with radiosensitizing Xeloda. When she completes this she will proceed with her surgery. Once she recovers from it we certainly could get her started back on radiation therapy  PLAN:  #1 proceed with cycle 5 of FOLFOX. She will return in one week's time for followup  #2 patient is going to be going to probably to get a proctoscope on 02/16/2013.  #3 I will refer the patient back to Dr. Margaretmary Dys for consideration of radiation with concurrent Xeloda  #4 refill for Percocet was given to the patient  All questions were answered. The patient knows to call the clinic with any problems, questions or concerns. We can certainly see the patient much sooner if necessary.  I spent 25 minutes counseling the patient face to face. The total time spent in the appointment was 30 minutes.  Drue Second, MD Medical/Oncology [facility] [phone] (beeper) [phone] (Office)

## 2013-02-13 NOTE — Patient Instructions (Signed)
[facility] Discharge Instructions for Patients Receiving Chemotherapy  Today you received the following chemotherapy agents oxaliplatin, leucovorin, 5FU, 5FU pump  To help prevent nausea and vomiting after your treatment, we encourage you to take your nausea medication if needed   If you develop nausea and vomiting that is not controlled by your nausea medication, call the clinic.   BELOW ARE SYMPTOMS THAT SHOULD BE REPORTED IMMEDIATELY:  *FEVER GREATER THAN 100.5 F  *CHILLS WITH OR WITHOUT FEVER  NAUSEA AND VOMITING THAT IS NOT CONTROLLED WITH YOUR NAUSEA MEDICATION  *UNUSUAL SHORTNESS OF BREATH  *UNUSUAL BRUISING OR BLEEDING  TENDERNESS IN MOUTH AND THROAT WITH OR WITHOUT PRESENCE OF ULCERS  *URINARY PROBLEMS  *BOWEL PROBLEMS  UNUSUAL RASH Items with * indicate a potential emergency and should be followed up as soon as possible.  Feel free to call the clinic you have any questions or concerns. The clinic phone number is [phone].

## 2013-02-15 ENCOUNTER — Ambulatory Visit: Payer: 59

## 2013-02-15 ENCOUNTER — Ambulatory Visit (HOSPITAL_BASED_OUTPATIENT_CLINIC_OR_DEPARTMENT_OTHER): Payer: 59

## 2013-02-15 ENCOUNTER — Ambulatory Visit: Payer: 59 | Admitting: Radiation Oncology

## 2013-02-15 VITALS — BP 130/73 | HR 88 | Temp 97.8°F | Resp 20

## 2013-02-15 DIAGNOSIS — C7800 Secondary malignant neoplasm of unspecified lung: Secondary | ICD-10-CM

## 2013-02-15 DIAGNOSIS — C20 Malignant neoplasm of rectum: Secondary | ICD-10-CM

## 2013-02-15 DIAGNOSIS — Z452 Encounter for adjustment and management of vascular access device: Secondary | ICD-10-CM

## 2013-02-15 MED ORDER — HEPARIN SOD (PORK) LOCK FLUSH 100 UNIT/ML IV SOLN
500.0000 [IU] | Freq: Once | INTRAVENOUS | Status: AC | PRN
  Administered 2013-02-15: 500 [IU]
  Filled 2013-02-15: qty 5

## 2013-02-15 MED ORDER — SODIUM CHLORIDE 0.9 % IJ SOLN
10.0000 mL | INTRAMUSCULAR | Status: DC | PRN
  Administered 2013-02-15: 10 mL
  Filled 2013-02-15: qty 10

## 2013-02-16 LAB — HM DIABETES EYE EXAM: HM Diabetic Eye Exam: NOT DETECTED

## 2013-02-20 ENCOUNTER — Other Ambulatory Visit: Payer: 59 | Admitting: Lab

## 2013-02-20 ENCOUNTER — Ambulatory Visit: Payer: 59 | Admitting: Oncology

## 2013-02-20 ENCOUNTER — Encounter: Payer: Self-pay | Admitting: Internal Medicine

## 2013-02-21 ENCOUNTER — Telehealth: Payer: Self-pay | Admitting: Radiation Oncology

## 2013-02-21 ENCOUNTER — Ambulatory Visit (INDEPENDENT_AMBULATORY_CARE_PROVIDER_SITE_OTHER): Payer: 59 | Admitting: Thoracic Surgery (Cardiothoracic Vascular Surgery)

## 2013-02-21 ENCOUNTER — Other Ambulatory Visit: Payer: Self-pay | Admitting: *Deleted

## 2013-02-21 ENCOUNTER — Encounter: Payer: Self-pay | Admitting: Thoracic Surgery (Cardiothoracic Vascular Surgery)

## 2013-02-21 VITALS — BP 133/78 | HR 94 | Resp 16 | Ht 64.0 in | Wt 121.0 lb

## 2013-02-21 DIAGNOSIS — C7800 Secondary malignant neoplasm of unspecified lung: Secondary | ICD-10-CM

## 2013-02-21 DIAGNOSIS — Z09 Encounter for follow-up examination after completed treatment for conditions other than malignant neoplasm: Secondary | ICD-10-CM

## 2013-02-21 DIAGNOSIS — R918 Other nonspecific abnormal finding of lung field: Secondary | ICD-10-CM

## 2013-02-21 DIAGNOSIS — J869 Pyothorax without fistula: Secondary | ICD-10-CM

## 2013-02-21 DIAGNOSIS — C20 Malignant neoplasm of rectum: Secondary | ICD-10-CM

## 2013-02-21 NOTE — Telephone Encounter (Signed)
Received call from Dr. Kathrynn Running to place patient on the CT/Simulation schedule tomorrow. Obtained 3 pm appointment for simulation from [place], RT. Phoned patient on her cell making her aware of this appointment. She confirmed she would be present. Also, explained that her lung nodule would be resected, she would spend five days recovering, and then rectal radiation would begin. She verbalized understanding.

## 2013-02-21 NOTE — Progress Notes (Signed)
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 This was proven by biopsy. She started chemotherapy. She then developed a right empyema and required thoracoscopic drainage. She recovered well from that procedure. She's now had 5 cycles of chemotherapy. No further chemotherapy is planned at this point.  Dr. Kahn has talked to the colorectal surgeons Dr. Strauch and Chilulli at [facility]. The plan that they have formulated and they feel gives her the best overall chance of survival is to 1) resect the lung metastasis, 2) pelvic radiation and then 3) low anterior resection.  I discussed this with Dr. Feeser and she wishes to proceed with that plan.  Past Medical History  Diagnosis Date  . Postmenopausal HRT (hormone replacement therapy) 07/01/2011    surgical menopause  . Hx of hyperglycemia 07/01/2011  . Hx of compression fracture of spine 1985    t12 after a  20 foot fall  . Hx of fracture of wrist     after fall   . 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 Hx of colonic polyps   . Early cataract     stoneburner   . High frequency hearing loss     [facility]raus followed   . Diabetes mellitus without complication   . Complication of anesthesia     pt hadpanic attacks with versed, no versed  . Status post chemotherapy     completed a total of three cycles of Folfox   . Colon cancer     rectal cancer with lung metastasis  . Skin cancer     multiple  . Heart murmur, systolic 07/01/2011    innocent per Dr. Tom       Current Outpatient Prescriptions  Medication Sig Dispense Refill  . dexamethasone (DECADRON) 4 MG tablet Take 2 tablets (8 mg total) by mouth daily. To stimulate appetite.  30 tablet  6  . estradiol (MINIVELLE) 0.0375 MG/24HR Place 1 patch onto the skin 2 (two) times a week. Sundays and Thursdays      . metFORMIN (GLUCOPHAGE) 1000  MG tablet Take 1,000 mg by mouth 2 (two) times daily with a meal.      . Multiple Vitamin (MULTIVITAMIN) tablet Take 1 tablet by mouth daily. Senior      . omeprazole (PRILOSEC) 40 MG capsule Take 1 capsule (40 mg total) by mouth daily.  30 capsule  6  . ondansetron (ZOFRAN ODT) 8 MG disintegrating tablet Take 1 tablet (8 mg total) by mouth every 8 (eight) hours as needed for nausea.  30 tablet  2  . oxyCODONE-acetaminophen (PERCOCET/ROXICET) 5-325 MG per tablet Take 1-2 tablets by mouth every 4 (four) hours as needed.  80 tablet  0  . polyethylene glycol (MIRALAX / GLYCOLAX) packet Take 17 g by mouth daily. Taking in am and hs      . Probiotic Product (SOLUBLE FIBER/PROBIOTICS PO) Take 1 tablet by mouth daily.      . sitaGLIPtin (JANUVIA) 100 MG tablet Take 100 mg by mouth daily.       No current facility-administered medications for this visit.    Physical Exam BP 133/78  Pulse 94  Resp 16  Ht 5' 4" (1.626 m)  Wt 121 lb (54.885 kg)  BMI 20.76 kg/m2  SpO2 98% 66-year-old woman in no acute distress Neurologic alert and oriented   x3, appropriate, no deficits Lungs clear with equal breath sounds bilaterally Cardiac regular rate and rhythm no murmur  Diagnostic Tests: CT chest 02/10/2013 CT CHEST WITH CONTRAST  TECHNIQUE:  Multidetector CT imaging of the chest was performed during  intravenous contrast administration.  CONTRAST: 80mL OMNIPAQUE IOHEXOL 300 MG/ML SOLN  COMPARISON: 01/05/2013 similar exam, chest radiograph 01/24/2013  FINDINGS:  Great vessels are normal in caliber. Heart size is normal. No  pericardial or pleural effusion. No mediastinal or measurable hilar  lymphadenopathy.  Incomplete imaging of the upper abdomen demonstrates  normal-appearing right adrenal gland and visualized portions of the  incompletely imaged left adrenal gland appear normal. Left-sided  Port-A-Cath in place with tip in the mid SVC. Thoracic spine disc  degenerative change noted without  interval development of lytic or  sclerotic osseous lesion. Stable L2 compression deformity.  Right upper lobe pulmonary nodule measures 6 mm maximally image 19,  previously 7 mm. Masslike pleural parenchymal consolidation within  the right upper lobe measures 5.0 x 5.7 cm compared to 4.3 x 3.9 cm  at the same anatomic level previously. Previously seen subcutaneous  gas has resolved.  4 mm left lower lobe pulmonary parenchymal nodule image 37 is  stable.  Right-sided pleural effusion has resolved.  5 mm right upper lobe pulmonary nodule image 17 is stable.  Subjective decrease in right upper lobe pleural parenchymal  thickening.  IMPRESSION:  Slight interval increase in size of dominant right upper lobe  pleural parenchymal masslike lesion. This may in part be due to  differences in configuration and or adjacent pleural thickening/  scarring, within the minimal with close followup as part of the  patient's staging protocol, or PET-CT may be helpful to determine  presence or absence of metabolic activity that could help further  differentiate between treatment effect and malignancy.  Pulmonary nodules are stable elsewhere.  Resolution of right pleural effusion.  Electronically Signed  By: Gretchen Green M.D.  On: 02/10/2013 16:23  Impression: 66-year-old woman with rectal cancer with a solitary lung metastasis. She's now had 5 cycles of chemotherapy. She has tolerated these well. She did develop a right-sided empyema and required surgical drainage. The plan now is to proceed with lung resection followed by pelvic radiation and then finally low anterior resection. Oncology, the colorectal surgeons and radiation oncology all feel that this gives her the best overall chance of cure and is the way to proceed.  I did discuss the proposed surgical procedure with Dr. Ferrell. Given the size of this lesion and her previous surgery I am not optimistic that can be done in a minimally invasive  fashion. In fact it will probably require a full thoracotomy. We will proceed with right VATS, possible thoracotomy, resection of right lung mass on Monday, December 1. She does understand that there is a high likelihood of needing a thoracotomy and possibly rib removal. We discussed the general nature of the procedure, the need for general anesthesia, the expected hospital stay, and the overall recovery. She understands no guarantee can be given that this will be a curative resection. I do think we may have to do a lateral basilar segmentectomy to remove the mass. We discussed the indications, risks, benefits, and alternatives. She understands the risks include, but are not limited to, death, MI, DVT, PE, stroke, bleeding, possible need for transfusion, infection, respiratory failure, prolonged air leaks, as well as unforeseeable, occasions. She completely understands the risks, has no questions about them, and wishes to proceed as soon   as possible.  Plan: Right VATS, possible thoracotomy, resection of right lung mass on Monday, December 1   

## 2013-02-21 NOTE — Telephone Encounter (Signed)
Received call from [name] regarding "starting radiation." Patient explains that she will have her sixth chemotherapy treatment next week and thought she understood she would see Dr. Kathrynn Running to discuss radiation therapy prior to surgery. Dr. Welton Flakes note from 02/13/2013 states, "#3 I will refer the patient back to Dr. Margaretmary Dys for consideration of radiation with concurrent Xeloda." Presently, there is no appointment scheduled for this patient with Dr. Kathrynn Running. This writer verbalized she will make Dr. Kathrynn Running and Dr. Welton Flakes aware of this and contact her back. She expressed appreciation for the assistance.

## 2013-02-21 NOTE — Telephone Encounter (Signed)
Spoke to Dr. Kathrynn Running regarding this patient

## 2013-02-22 ENCOUNTER — Ambulatory Visit
Admission: RE | Admit: 2013-02-22 | Discharge: 2013-02-22 | Disposition: A | Discharge status: Home / Self Care | Payer: 59 | Source: Ambulatory Visit | Attending: Radiation Oncology | Admitting: Radiation Oncology

## 2013-02-22 DIAGNOSIS — Z510 Encounter for antineoplastic radiation therapy: Secondary | ICD-10-CM | POA: Insufficient documentation

## 2013-02-22 DIAGNOSIS — Z79899 Other long term (current) drug therapy: Secondary | ICD-10-CM | POA: Insufficient documentation

## 2013-02-22 DIAGNOSIS — K660 Peritoneal adhesions (postprocedural) (postinfection): Secondary | ICD-10-CM | POA: Insufficient documentation

## 2013-02-22 DIAGNOSIS — C20 Malignant neoplasm of rectum: Secondary | ICD-10-CM | POA: Insufficient documentation

## 2013-02-22 DIAGNOSIS — R197 Diarrhea, unspecified: Secondary | ICD-10-CM | POA: Insufficient documentation

## 2013-02-22 DIAGNOSIS — Z90710 Acquired absence of both cervix and uterus: Secondary | ICD-10-CM | POA: Insufficient documentation

## 2013-02-22 DIAGNOSIS — K6289 Other specified diseases of anus and rectum: Secondary | ICD-10-CM | POA: Insufficient documentation

## 2013-02-22 DIAGNOSIS — R634 Abnormal weight loss: Secondary | ICD-10-CM | POA: Insufficient documentation

## 2013-02-22 DIAGNOSIS — R109 Unspecified abdominal pain: Secondary | ICD-10-CM | POA: Insufficient documentation

## 2013-02-22 DIAGNOSIS — C7800 Secondary malignant neoplasm of unspecified lung: Secondary | ICD-10-CM | POA: Insufficient documentation

## 2013-02-22 NOTE — Progress Notes (Signed)
  Radiation Oncology         [phone] ________________________________  Name: Anne [name] MRN: [id number]  Date: 02/22/2013  DOB: [birth year]  Chart Note:  Pelvic MRI at [facility] 11/25 result from Care Everywhere  POS [facility]): 1  INDICATION: Restaging rectal cancer. Right lung metastasis. Status post chemotherapy.  TECHNIQUE: Multiplanar, multisequence imaging of the pelvis performed both prior to and following the intravenous administration of 11 mL of Magnevist. Comparison PET/CT dated 11/02/2012 performed at [facility].  FINDINGS: Irregular wall thickening involving the lower rectum in the region demonstrating increased FDG uptake on the PET/CT. There is no evidence of tumor extension outside of the rectal wall. An abrupt change in caliber of the rectum is seen more proximally in the upper rectum where there is question of wall thickening.  Uterus is surgically absent. Urinary bladder appears normal. No pelvic ascites. No pelvic or inguinal lymphadenopathy.  IMPRESSION: Irregular wall thickening involving the lower rectum likely represents the known tumor. As compared with the previous PET/CT, there appears to be interval decrease in the soft tissue suggesting response to chemotherapy. An abrupt change in the caliber of the rectum is seen in the upper aspect proximally with question of wall thickening. No evidence of extraluminal extension of tumor. No evidence of metastatic disease in the pelvis.  Signed (Electronic Signature): 02/21/2013 8:58 PM  Signed By: Kyung Rudd, MD  ________________________________  Artist Pais. Kathrynn Running, M.D.

## 2013-02-24 ENCOUNTER — Ambulatory Visit (HOSPITAL_COMMUNITY)
Admission: RE | Admit: 2013-02-24 | Discharge: 2013-02-24 | Disposition: A | Discharge status: Home / Self Care | Payer: 59 | Source: Ambulatory Visit | Attending: Thoracic Surgery (Cardiothoracic Vascular Surgery) | Admitting: Thoracic Surgery (Cardiothoracic Vascular Surgery)

## 2013-02-24 ENCOUNTER — Other Ambulatory Visit (HOSPITAL_COMMUNITY): Payer: 59

## 2013-02-24 ENCOUNTER — Encounter (HOSPITAL_COMMUNITY)
Admission: RE | Admit: 2013-02-24 | Discharge: 2013-02-24 | Disposition: A | Discharge status: Home / Self Care | Payer: 59 | Source: Ambulatory Visit | Attending: Thoracic Surgery (Cardiothoracic Vascular Surgery) | Admitting: Thoracic Surgery (Cardiothoracic Vascular Surgery)

## 2013-02-24 ENCOUNTER — Encounter (HOSPITAL_COMMUNITY): Payer: Self-pay

## 2013-02-24 VITALS — BP 140/80 | HR 97 | Temp 98.1°F | Resp 18 | Ht 64.5 in | Wt 122.2 lb

## 2013-02-24 DIAGNOSIS — Z01810 Encounter for preprocedural cardiovascular examination: Secondary | ICD-10-CM | POA: Insufficient documentation

## 2013-02-24 DIAGNOSIS — R918 Other nonspecific abnormal finding of lung field: Secondary | ICD-10-CM

## 2013-02-24 DIAGNOSIS — Z01818 Encounter for other preprocedural examination: Secondary | ICD-10-CM | POA: Insufficient documentation

## 2013-02-24 DIAGNOSIS — R222 Localized swelling, mass and lump, trunk: Secondary | ICD-10-CM | POA: Insufficient documentation

## 2013-02-24 HISTORY — DX: Unspecified asthma, uncomplicated: J45.909

## 2013-02-24 HISTORY — DX: Headache: R51

## 2013-02-24 LAB — BLOOD GAS, ARTERIAL
Acid-base deficit: 1.7 mmol/L (ref 0.0–2.0)
Bicarbonate: 22.2 mEq/L (ref 20.0–24.0)
Drawn by: 206361
pCO2 arterial: 35.6 mmHg (ref 35.0–45.0)
pH, Arterial: 7.412 (ref 7.350–7.450)
pO2, Arterial: 99.4 mmHg (ref 80.0–100.0)

## 2013-02-24 LAB — TYPE AND SCREEN
ABO/RH(D): O POS
Antibody Screen: NEGATIVE

## 2013-02-24 LAB — COMPREHENSIVE METABOLIC PANEL
AST: 18 U/L (ref 0–37)
Albumin: 3.6 g/dL (ref 3.5–5.2)
Calcium: 9.9 mg/dL (ref 8.4–10.5)
Chloride: 94 mEq/L — ABNORMAL LOW (ref 96–112)
Creatinine, Ser: 0.4 mg/dL — ABNORMAL LOW (ref 0.50–1.10)
Glucose, Bld: 214 mg/dL — ABNORMAL HIGH (ref 70–99)
Total Protein: 6.8 g/dL (ref 6.0–8.3)

## 2013-02-24 LAB — CBC
MCH: 27.9 pg (ref 26.0–34.0)
MCV: 80.9 fL (ref 78.0–100.0)
Platelets: 253 10*3/uL (ref 150–400)
RBC: 4.09 MIL/uL (ref 3.87–5.11)
RDW: 21.7 % — ABNORMAL HIGH (ref 11.5–15.5)
WBC: 10.7 10*3/uL — ABNORMAL HIGH (ref 4.0–10.5)

## 2013-02-24 LAB — PROTIME-INR: INR: 0.99 (ref 0.00–1.49)

## 2013-02-24 LAB — SURGICAL PCR SCREEN: MRSA, PCR: NEGATIVE

## 2013-02-24 LAB — APTT: aPTT: 23 seconds — ABNORMAL LOW (ref 24–37)

## 2013-02-24 NOTE — Pre-Procedure Instructions (Signed)
TAWNYA PUJOL  02/24/2013   Your procedure is scheduled on: Dec. 1 @0730   Report to [facility]  2 * 3 at 0530AM.  Call this number if you have problems the morning of surgery: [phone]   Remember:   Do not eat food or drink liquids after midnight.   Take these medicines the morning of surgery with A SIP OF WATER: Prilosec and percocet if needed  Stop taking Aspirin, Aleve, Ibuprofen, BC's, Goody's, Herbal medications, Fish Oil   Do not wear jewelry, make-up or nail polish.  Do not wear lotions, powders, or perfumes. You may wear deodorant.  Do not shave 48 hours prior to surgery.  Do not bring valuables to the hospital.  [facility] is not responsible                  for any belongings or valuables.               Contacts, dentures or bridgework may not be worn into surgery.  Leave suitcase in the car. After surgery it may be brought to your room.  For patients admitted to the hospital, discharge time is determined by your                treatment team.               Patients discharged the day of surgery will not be allowed to drive  home.    Special Instructions: Shower using CHG 2 nights before surgery and the night before surgery.  If you shower the day of surgery use CHG.  Use special wash - you have one bottle of CHG for all showers.  You should use approximately 1/3 of the bottle for each shower.   Please read over the following fact sheets that you were given: Pain Booklet, Coughing and Deep Breathing, Blood Transfusion Information, MRSA Information and Surgical Site Infection Prevention

## 2013-02-24 NOTE — Progress Notes (Addendum)
PCP is Dr. Neta Mends. [place] Cardiologist is Dr Daleen Squibb Echo completed 12-16-12 in epic Denies having a cardiac cath or stress test.

## 2013-02-25 NOTE — Progress Notes (Signed)
  Radiation Oncology         [phone]) [phone] ________________________________  Name: Anne [name] MRN: [id number]  Date: 02/22/2013  DOB: Apr 25, 1945  SIMULATION AND TREATMENT PLANNING NOTE  DIAGNOSIS:  67 yo woman with rectal cancer rectal cancer and a solitary lung metastasis  NARRATIVE:  The patient was brought to the CT Simulation planning suite.  Identity was confirmed.  All relevant records and images related to the planned course of therapy were reviewed.  The patient freely provided informed written consent to proceed with treatment after reviewing the details related to the planned course of therapy. The consent form was witnessed and verified by the simulation staff.  Then, the patient was set-up in a stable reproducible  prone and belly board position for radiation therapy.  A rectal exam was performed to document 7-8 cm to tumor from verge and then a rectal tube was used to instill contrast to visualize the tumor surface.  CT images were obtained.  Surface markings were placed.  The CT images were loaded into the planning software.  Then the target and avoidance structures were contoured.  Treatment planning then occurred.  The radiation prescription was entered and confirmed.  Then, I am requesting the design and will supervise the construction of a total of one medically necessary IMRT collimator  treatment devices in the form of a sinogram for treatment delivery.    MEDICAL NECESSITY:  Intensity Modulated Radiotherapy (IMRT) is medically necessary for this case for the following reason:  The patient has undergone previous hysterectomy which has caused some small bowel adhesion into the true pelvis.  In this case, the only method of sparing small bowel below accepted tolerance is through dose sculpting with TomoTherapy.  In addition, her partial obstruction at the site of the tumor has caused dilation of the loops of small bowel creating massive expansion of this critical structure  throughout the peritoneum and especically into the pelvis.    SPECIAL TREATMENT PROCEDURE:  Special care is required in the management of this patient for the following reasons.  This treatment constitutes a Special Treatment Procedure for the following reason: Concurrent chemotherapy requiring careful monitoring for increased toxicities of treatment including weekly laboratory values..  The special nature of the planned course of radiotherapy will require increased physician supervision and oversight to ensure patient's safety with optimal treatment outcomes.  PLAN:  The patient will receive 45 Gy in 25 fractions.  Prior to starting radiation treatment, the patient will undergo right lower lobectomy for metastectomy with curative intent.  Post-operatively, if she experiences an uneventful recovery, we will plan to initiate radiotherapy on Monday 03/06/13.  If this is her start date, and she completes radiotherapy, without interruption accounting for missed treatments on Christmas Day and New Years Day, she will likely complete radiotherapy on Tuesday, April 04, 2013.  If she proceeds to LAR six weeks post radiation this would put pelvic surgery in mid-February  ________________________________  Artist Pais. Kathrynn Running, M.D.

## 2013-02-26 MED ORDER — DEXTROSE 5 % IV SOLN
1.5000 g | INTRAVENOUS | Status: AC
  Administered 2013-02-27: 1.5 g via INTRAVENOUS
  Filled 2013-02-26: qty 1.5

## 2013-02-27 ENCOUNTER — Encounter (HOSPITAL_COMMUNITY): Payer: Self-pay | Admitting: *Deleted

## 2013-02-27 ENCOUNTER — Encounter (HOSPITAL_COMMUNITY): Payer: 59 | Admitting: Anesthesiology

## 2013-02-27 ENCOUNTER — Inpatient Hospital Stay (HOSPITAL_COMMUNITY): Payer: 59

## 2013-02-27 ENCOUNTER — Inpatient Hospital Stay (HOSPITAL_COMMUNITY)
Admission: RE | Admit: 2013-02-27 | Discharge: 2013-03-04 | DRG: 164 | Disposition: A | Discharge status: Home / Self Care | Payer: 59 | Source: Ambulatory Visit | Attending: Thoracic Surgery (Cardiothoracic Vascular Surgery) | Admitting: Thoracic Surgery (Cardiothoracic Vascular Surgery)

## 2013-02-27 ENCOUNTER — Encounter (HOSPITAL_COMMUNITY)
Admission: RE | Disposition: A | Discharge status: Home / Self Care | Payer: Self-pay | Source: Ambulatory Visit | Attending: Thoracic Surgery (Cardiothoracic Vascular Surgery)

## 2013-02-27 ENCOUNTER — Inpatient Hospital Stay (HOSPITAL_COMMUNITY): Payer: 59 | Admitting: Anesthesiology

## 2013-02-27 DIAGNOSIS — C7800 Secondary malignant neoplasm of unspecified lung: Secondary | ICD-10-CM

## 2013-02-27 DIAGNOSIS — E871 Hypo-osmolality and hyponatremia: Secondary | ICD-10-CM | POA: Diagnosis present

## 2013-02-27 DIAGNOSIS — Z9889 Other specified postprocedural states: Secondary | ICD-10-CM

## 2013-02-27 DIAGNOSIS — IMO0001 Reserved for inherently not codable concepts without codable children: Secondary | ICD-10-CM | POA: Diagnosis present

## 2013-02-27 DIAGNOSIS — G8918 Other acute postprocedural pain: Secondary | ICD-10-CM

## 2013-02-27 DIAGNOSIS — T380X5A Adverse effect of glucocorticoids and synthetic analogues, initial encounter: Secondary | ICD-10-CM | POA: Diagnosis not present

## 2013-02-27 DIAGNOSIS — R918 Other nonspecific abnormal finding of lung field: Secondary | ICD-10-CM

## 2013-02-27 DIAGNOSIS — Z9221 Personal history of antineoplastic chemotherapy: Secondary | ICD-10-CM

## 2013-02-27 DIAGNOSIS — J9382 Other air leak: Secondary | ICD-10-CM | POA: Diagnosis not present

## 2013-02-27 DIAGNOSIS — C20 Malignant neoplasm of rectum: Secondary | ICD-10-CM | POA: Diagnosis present

## 2013-02-27 DIAGNOSIS — Z85828 Personal history of other malignant neoplasm of skin: Secondary | ICD-10-CM

## 2013-02-27 DIAGNOSIS — Z79890 Hormone replacement therapy (postmenopausal): Secondary | ICD-10-CM

## 2013-02-27 HISTORY — PX: THOROCOTOMY WITH LOBECTOMY: SHX6118

## 2013-02-27 LAB — URINALYSIS, ROUTINE W REFLEX MICROSCOPIC
Bilirubin Urine: NEGATIVE
Hgb urine dipstick: NEGATIVE
Ketones, ur: NEGATIVE mg/dL
Protein, ur: NEGATIVE mg/dL
Urobilinogen, UA: 0.2 mg/dL (ref 0.0–1.0)

## 2013-02-27 LAB — POCT I-STAT 4, (NA,K, GLUC, HGB,HCT)
Glucose, Bld: 264 mg/dL — ABNORMAL HIGH (ref 70–99)
HCT: 25 % — ABNORMAL LOW (ref 36.0–46.0)
Hemoglobin: 8.5 g/dL — ABNORMAL LOW (ref 12.0–15.0)

## 2013-02-27 LAB — GLUCOSE, CAPILLARY
Glucose-Capillary: 226 mg/dL — ABNORMAL HIGH (ref 70–99)
Glucose-Capillary: 219 mg/dL — ABNORMAL HIGH (ref 70–99)
Glucose-Capillary: 136 mg/dL — ABNORMAL HIGH (ref 70–99)

## 2013-02-27 SURGERY — THORACOTOMY, WITH LOBECTOMY
Anesthesia: General | Site: Chest | Laterality: Right | Wound class: Clean Contaminated

## 2013-02-27 MED ORDER — POLYETHYLENE GLYCOL 3350 17 G PO PACK
17.0000 g | PACK | Freq: Every day | ORAL | Status: DC
  Administered 2013-03-01 – 2013-03-04 (×4): 17 g via ORAL
  Filled 2013-02-27 (×5): qty 1

## 2013-02-27 MED ORDER — KETOROLAC TROMETHAMINE 15 MG/ML IJ SOLN
15.0000 mg | Freq: Four times a day (QID) | INTRAMUSCULAR | Status: AC | PRN
  Administered 2013-02-27 – 2013-03-03 (×6): 15 mg via INTRAVENOUS
  Filled 2013-02-27 (×5): qty 1

## 2013-02-27 MED ORDER — PANTOPRAZOLE SODIUM 40 MG PO TBEC
80.0000 mg | DELAYED_RELEASE_TABLET | Freq: Every day | ORAL | Status: DC
  Administered 2013-02-27 – 2013-03-04 (×6): 80 mg via ORAL
  Filled 2013-02-27 (×7): qty 2

## 2013-02-27 MED ORDER — ARTIFICIAL TEARS OP OINT
TOPICAL_OINTMENT | OPHTHALMIC | Status: DC | PRN
  Administered 2013-02-27: 1 via OPHTHALMIC

## 2013-02-27 MED ORDER — DIPHENHYDRAMINE HCL 50 MG/ML IJ SOLN: 12.5000 mg | Freq: Four times a day (QID) | INTRAMUSCULAR | Status: DC | PRN

## 2013-02-27 MED ORDER — PROPOFOL 10 MG/ML IV BOLUS
INTRAVENOUS | Status: DC | PRN
  Administered 2013-02-27: 200 mg via INTRAVENOUS

## 2013-02-27 MED ORDER — ONDANSETRON HCL 4 MG/2ML IJ SOLN
INTRAMUSCULAR | Status: DC | PRN
  Administered 2013-02-27: 4 mg via INTRAVENOUS

## 2013-02-27 MED ORDER — BUPIVACAINE 0.5 % ON-Q PUMP SINGLE CATH 400 ML
400.0000 mL | INJECTION | Status: DC
  Filled 2013-02-27: qty 400

## 2013-02-27 MED ORDER — ROCURONIUM BROMIDE 100 MG/10ML IV SOLN
INTRAVENOUS | Status: DC | PRN
  Administered 2013-02-27 (×2): 10 mg via INTRAVENOUS
  Administered 2013-02-27: 20 mg via INTRAVENOUS
  Administered 2013-02-27 (×5): 10 mg via INTRAVENOUS
  Administered 2013-02-27: 50 mg via INTRAVENOUS
  Administered 2013-02-27: 10 mg via INTRAVENOUS

## 2013-02-27 MED ORDER — ALBUMIN HUMAN 5 % IV SOLN
INTRAVENOUS | Status: DC | PRN
  Administered 2013-02-27: 12:00:00 via INTRAVENOUS

## 2013-02-27 MED ORDER — BIOTENE DRY MOUTH MT LIQD
15.0000 mL | Freq: Two times a day (BID) | OROMUCOSAL | Status: DC
  Administered 2013-02-27 – 2013-03-03 (×7): 15 mL via OROMUCOSAL

## 2013-02-27 MED ORDER — DIPHENHYDRAMINE HCL 12.5 MG/5ML PO ELIX
12.5000 mg | ORAL_SOLUTION | Freq: Four times a day (QID) | ORAL | Status: DC | PRN
  Filled 2013-02-27: qty 5

## 2013-02-27 MED ORDER — ONDANSETRON HCL 4 MG/2ML IJ SOLN: 4.0000 mg | Freq: Four times a day (QID) | INTRAMUSCULAR | Status: DC | PRN

## 2013-02-27 MED ORDER — SODIUM CHLORIDE 0.9 % IJ SOLN: 9.0000 mL | INTRAMUSCULAR | Status: DC | PRN

## 2013-02-27 MED ORDER — HYDROMORPHONE HCL PF 1 MG/ML IJ SOLN
0.2500 mg | INTRAMUSCULAR | Status: DC | PRN
  Administered 2013-02-27: 0.25 mg via INTRAVENOUS
  Administered 2013-02-27 (×5): 0.5 mg via INTRAVENOUS

## 2013-02-27 MED ORDER — DEXTROSE 5 % IV SOLN
1.5000 g | Freq: Two times a day (BID) | INTRAVENOUS | Status: AC
  Administered 2013-02-27 – 2013-02-28 (×2): 1.5 g via INTRAVENOUS
  Filled 2013-02-27 (×2): qty 1.5

## 2013-02-27 MED ORDER — NALOXONE HCL 0.4 MG/ML IJ SOLN: 0.4000 mg | INTRAMUSCULAR | Status: DC | PRN

## 2013-02-27 MED ORDER — FENTANYL CITRATE 0.05 MG/ML IJ SOLN
INTRAMUSCULAR | Status: DC | PRN
  Administered 2013-02-27 (×5): 50 ug via INTRAVENOUS
  Administered 2013-02-27 (×2): 100 ug via INTRAVENOUS
  Administered 2013-02-27 (×2): 50 ug via INTRAVENOUS
  Administered 2013-02-27: 100 ug via INTRAVENOUS
  Administered 2013-02-27 (×5): 50 ug via INTRAVENOUS
  Administered 2013-02-27: 150 ug via INTRAVENOUS
  Administered 2013-02-27: 100 ug via INTRAVENOUS

## 2013-02-27 MED ORDER — GLYCOPYRROLATE 0.2 MG/ML IJ SOLN
INTRAMUSCULAR | Status: DC | PRN
  Administered 2013-02-27: 0.4 mg via INTRAVENOUS

## 2013-02-27 MED ORDER — HYDROMORPHONE HCL PF 1 MG/ML IJ SOLN
INTRAMUSCULAR | Status: AC
  Administered 2013-02-27: 0.5 mg via INTRAVENOUS
  Filled 2013-02-27: qty 1

## 2013-02-27 MED ORDER — INSULIN ASPART 100 UNIT/ML ~~LOC~~ SOLN
0.0000 [IU] | SUBCUTANEOUS | Status: DC
  Administered 2013-02-27: 8 [IU] via SUBCUTANEOUS
  Administered 2013-02-27: 2 [IU] via SUBCUTANEOUS

## 2013-02-27 MED ORDER — PROMETHAZINE HCL 25 MG/ML IJ SOLN: 6.2500 mg | INTRAMUSCULAR | Status: DC | PRN

## 2013-02-27 MED ORDER — HYDROMORPHONE HCL PF 1 MG/ML IJ SOLN
INTRAMUSCULAR | Status: AC
  Filled 2013-02-27: qty 1

## 2013-02-27 MED ORDER — OXYCODONE HCL 5 MG PO TABS: 5.0000 mg | ORAL_TABLET | Freq: Once | ORAL | Status: DC | PRN

## 2013-02-27 MED ORDER — NEOSTIGMINE METHYLSULFATE 1 MG/ML IJ SOLN
INTRAMUSCULAR | Status: DC | PRN
  Administered 2013-02-27: 3 mg via INTRAVENOUS

## 2013-02-27 MED ORDER — ACETAMINOPHEN 500 MG PO TABS
1000.0000 mg | ORAL_TABLET | Freq: Four times a day (QID) | ORAL | Status: AC
  Administered 2013-02-27 – 2013-02-28 (×4): 1000 mg via ORAL
  Filled 2013-02-27 (×4): qty 2

## 2013-02-27 MED ORDER — SENNOSIDES-DOCUSATE SODIUM 8.6-50 MG PO TABS
1.0000 | ORAL_TABLET | Freq: Every evening | ORAL | Status: DC | PRN
  Filled 2013-02-27: qty 1

## 2013-02-27 MED ORDER — BISACODYL 5 MG PO TBEC
10.0000 mg | DELAYED_RELEASE_TABLET | Freq: Every day | ORAL | Status: DC
  Administered 2013-02-27 – 2013-03-03 (×4): 10 mg via ORAL
  Filled 2013-02-27 (×5): qty 2

## 2013-02-27 MED ORDER — OXYCODONE HCL 5 MG/5ML PO SOLN: 5.0000 mg | Freq: Once | ORAL | Status: DC | PRN

## 2013-02-27 MED ORDER — KETOROLAC TROMETHAMINE 30 MG/ML IJ SOLN
INTRAMUSCULAR | Status: AC
  Filled 2013-02-27: qty 1

## 2013-02-27 MED ORDER — LEVALBUTEROL HCL 0.63 MG/3ML IN NEBU
0.6300 mg | INHALATION_SOLUTION | Freq: Four times a day (QID) | RESPIRATORY_TRACT | Status: DC
  Administered 2013-02-27 – 2013-02-28 (×2): 0.63 mg via RESPIRATORY_TRACT
  Filled 2013-02-27 (×6): qty 3

## 2013-02-27 MED ORDER — OXYCODONE-ACETAMINOPHEN 5-325 MG PO TABS
1.0000 | ORAL_TABLET | ORAL | Status: DC | PRN
  Administered 2013-02-28 – 2013-03-04 (×18): 2 via ORAL
  Filled 2013-02-27 (×18): qty 2

## 2013-02-27 MED ORDER — FENTANYL 10 MCG/ML IV SOLN
INTRAVENOUS | Status: DC
  Administered 2013-02-27: 50 mL via INTRAVENOUS
  Administered 2013-02-27: 45 ug via INTRAVENOUS
  Administered 2013-02-28: 06:00:00 via INTRAVENOUS
  Administered 2013-02-28: 225 ug via INTRAVENOUS
  Administered 2013-02-28: 135 ug via INTRAVENOUS
  Administered 2013-02-28: 80 ug via INTRAVENOUS
  Administered 2013-02-28: 120 ug via INTRAVENOUS
  Administered 2013-02-28: 60 ug via INTRAVENOUS
  Administered 2013-02-28: 180 ug via INTRAVENOUS
  Administered 2013-03-01: 145.7 ug via INTRAVENOUS
  Administered 2013-03-01: 03:00:00 via INTRAVENOUS
  Administered 2013-03-01: 45 ug via INTRAVENOUS
  Filled 2013-02-27 (×3): qty 50

## 2013-02-27 MED ORDER — ACETAMINOPHEN 160 MG/5ML PO SOLN: 1000.0000 mg | Freq: Four times a day (QID) | ORAL | Status: AC

## 2013-02-27 MED ORDER — BUPIVACAINE 0.5 % ON-Q PUMP SINGLE CATH 400 ML
INJECTION | Status: DC | PRN
  Administered 2013-02-27: 400 mL

## 2013-02-27 MED ORDER — OXYCODONE HCL 5 MG PO TABS
5.0000 mg | ORAL_TABLET | ORAL | Status: AC | PRN
  Administered 2013-02-28: 5 mg via ORAL
  Filled 2013-02-27: qty 1

## 2013-02-27 MED ORDER — LACTATED RINGERS IV SOLN
INTRAVENOUS | Status: DC | PRN
  Administered 2013-02-27 (×5): via INTRAVENOUS

## 2013-02-27 MED ORDER — BUPIVACAINE ON-Q PAIN PUMP (FOR ORDER SET NO CHG)
INJECTION | Status: DC
  Filled 2013-02-27: qty 1

## 2013-02-27 MED ORDER — ENOXAPARIN SODIUM 30 MG/0.3ML ~~LOC~~ SOLN
30.0000 mg | Freq: Two times a day (BID) | SUBCUTANEOUS | Status: DC
  Filled 2013-02-27 (×2): qty 0.3

## 2013-02-27 MED ORDER — POTASSIUM CHLORIDE 10 MEQ/50ML IV SOLN: 10.0000 meq | Freq: Every day | INTRAVENOUS | Status: DC | PRN

## 2013-02-27 MED ORDER — MEPERIDINE HCL 25 MG/ML IJ SOLN: 6.2500 mg | INTRAMUSCULAR | Status: DC | PRN

## 2013-02-27 MED ORDER — POTASSIUM CHLORIDE IN NACL 20-0.45 MEQ/L-% IV SOLN
INTRAVENOUS | Status: DC
  Administered 2013-02-27 – 2013-02-28 (×2): via INTRAVENOUS
  Filled 2013-02-27 (×4): qty 1000

## 2013-02-27 SURGICAL SUPPLY — 83 items
ADH SKN CLS APL DERMABOND .7 (GAUZE/BANDAGES/DRESSINGS) ×2
APL SKNCLS STERI-STRIP NONHPOA (GAUZE/BANDAGES/DRESSINGS)
BAG SPEC RTRVL LRG 6X4 10 (ENDOMECHANICALS)
BENZOIN TINCTURE PRP APPL 2/3 (GAUZE/BANDAGES/DRESSINGS) IMPLANT
CANISTER SUCTION 2500CC (MISCELLANEOUS) ×4 IMPLANT
CATH KIT ON Q 5IN SLV (PAIN MANAGEMENT) ×2 IMPLANT
CATH THORACIC 28FR (CATHETERS) IMPLANT
CATH THORACIC 36FR (CATHETERS) IMPLANT
CATH THORACIC 36FR RT ANG (CATHETERS) IMPLANT
CLIP TI MEDIUM 6 (CLIP) ×3 IMPLANT
CLIP TI WIDE RED SMALL 6 (CLIP) ×3 IMPLANT
CONT SPEC 4OZ CLIKSEAL STRL BL (MISCELLANEOUS) ×18 IMPLANT
COVER SURGICAL LIGHT HANDLE (MISCELLANEOUS) ×3 IMPLANT
DERMABOND ADVANCED (GAUZE/BANDAGES/DRESSINGS) ×1
DERMABOND ADVANCED .7 DNX12 (GAUZE/BANDAGES/DRESSINGS) ×2 IMPLANT
DRAIN CHANNEL 28F RND 3/8 FF (WOUND CARE) IMPLANT
DRAIN CHANNEL 32F RND 10.7 FF (WOUND CARE) IMPLANT
DRAPE LAPAROSCOPIC ABDOMINAL (DRAPES) ×3 IMPLANT
DRAPE SLUSH/WARMER DISC (DRAPES) ×2 IMPLANT
DRAPE WARM FLUID 44X44 (DRAPE) ×1 IMPLANT
DRSG COVADERM 4X6 (GAUZE/BANDAGES/DRESSINGS) ×2 IMPLANT
ELECT REM PT RETURN 9FT ADLT (ELECTROSURGICAL) ×3
ELECTRODE REM PT RTRN 9FT ADLT (ELECTROSURGICAL) ×2 IMPLANT
GLOVE SURG SIGNA 7.5 PF LTX (GLOVE) ×9 IMPLANT
GOWN PREVENTION PLUS XLARGE (GOWN DISPOSABLE) ×6 IMPLANT
GOWN STRL NON-REIN LRG LVL3 (GOWN DISPOSABLE) ×15 IMPLANT
HANDLE STAPLE ENDO GIA SHORT (STAPLE) ×1
HEMOSTAT SURGICEL 2X14 (HEMOSTASIS) IMPLANT
KIT BASIN OR (CUSTOM PROCEDURE TRAY) ×3 IMPLANT
KIT ROOM TURNOVER OR (KITS) ×3 IMPLANT
KIT SUCTION CATH 14FR (SUCTIONS) ×3 IMPLANT
NS IRRIG 1000ML POUR BTL (IV SOLUTION) ×6 IMPLANT
PACK CHEST (CUSTOM PROCEDURE TRAY) ×3 IMPLANT
PAD ARMBOARD 7.5X6 YLW CONV (MISCELLANEOUS) ×8 IMPLANT
POUCH ENDO CATCH II 15MM (MISCELLANEOUS) IMPLANT
POUCH SPECIMEN RETRIEVAL 10MM (ENDOMECHANICALS) IMPLANT
RELOAD EGIA 45 MED/THCK PURPLE (STAPLE) ×2 IMPLANT
RELOAD EGIA 45 TAN VASC (STAPLE) ×15 IMPLANT
RELOAD EGIA 60 MED/THCK PURPLE (STAPLE) ×9 IMPLANT
RELOAD ENDO GIA 30 3.5 (STAPLE) ×4 IMPLANT
RELOAD STAPLE 60 BLK XTHK ART (STAPLE) IMPLANT
RELOAD STAPLE 60 MED/THCK ART (STAPLE) IMPLANT
RELOAD TRI 2.0 60 XTHK VAS SUL (STAPLE) ×9 IMPLANT
SEALANT PROGEL (MISCELLANEOUS) IMPLANT
SEALANT SURG COSEAL 4ML (VASCULAR PRODUCTS) IMPLANT
SEALANT SURG COSEAL 8ML (VASCULAR PRODUCTS) IMPLANT
SOLUTION ANTI FOG 6CC (MISCELLANEOUS) ×3 IMPLANT
SPECIMEN JAR MEDIUM (MISCELLANEOUS) ×1 IMPLANT
SPONGE GAUZE 4X4 12PLY (GAUZE/BANDAGES/DRESSINGS) ×3 IMPLANT
STAPLER ENDO GIA 12 SHRT THIN (STAPLE) IMPLANT
STAPLER ENDO GIA 12MM SHORT (STAPLE) ×2 IMPLANT
SUT PDS AB 1 CTX 36 (SUTURE) ×30 IMPLANT
SUT PDS AB 2-0 CT1 27 (SUTURE) ×8 IMPLANT
SUT PDS II 0 TP-1 LOOPED 60 (SUTURE) IMPLANT
SUT PROLENE 4 0 RB 1 (SUTURE) ×9
SUT PROLENE 4-0 RB1 .5 CRCL 36 (SUTURE) ×6 IMPLANT
SUT SILK  1 MH (SUTURE) ×3
SUT SILK 1 MH (SUTURE) ×4 IMPLANT
SUT SILK 2 0SH CR/8 30 (SUTURE) IMPLANT
SUT SILK 3 0SH CR/8 30 (SUTURE) IMPLANT
SUT VIC AB 0 CTX 27 (SUTURE) IMPLANT
SUT VIC AB 1 CTX 27 (SUTURE) ×6 IMPLANT
SUT VIC AB 2-0 CT1 27 (SUTURE)
SUT VIC AB 2-0 CT1 TAPERPNT 27 (SUTURE) IMPLANT
SUT VIC AB 2-0 CTX 36 (SUTURE) ×4 IMPLANT
SUT VIC AB 3-0 MH 27 (SUTURE) ×6 IMPLANT
SUT VIC AB 3-0 SH 27 (SUTURE) ×6
SUT VIC AB 3-0 SH 27X BRD (SUTURE) ×4 IMPLANT
SUT VIC AB 3-0 X1 27 (SUTURE) ×6 IMPLANT
SUT VICRYL 0 UR6 27IN ABS (SUTURE) ×6 IMPLANT
SUT VICRYL 2 TP 1 (SUTURE) ×2 IMPLANT
SWAB COLLECTION DEVICE MRSA (MISCELLANEOUS) IMPLANT
SYSTEM SAHARA CHEST DRAIN ATS (WOUND CARE) ×3 IMPLANT
TAPE CLOTH SOFT 2X10 (GAUZE/BANDAGES/DRESSINGS) ×2 IMPLANT
TIP APPLICATOR SPRAY EXTEND 16 (VASCULAR PRODUCTS) IMPLANT
TOWEL OR 17X24 6PK STRL BLUE (TOWEL DISPOSABLE) ×5 IMPLANT
TOWEL OR 17X26 10 PK STRL BLUE (TOWEL DISPOSABLE) ×6 IMPLANT
TRAP SPECIMEN MUCOUS 40CC (MISCELLANEOUS) IMPLANT
TRAY FOLEY CATH 16FRSI W/METER (SET/KITS/TRAYS/PACK) ×3 IMPLANT
TROCAR XCEL BLADELESS 5X75MML (TROCAR) ×3 IMPLANT
TUBE ANAEROBIC SPECIMEN COL (MISCELLANEOUS) IMPLANT
TUNNELER SHEATH ON-Q 11GX8 DSP (PAIN MANAGEMENT) ×3 IMPLANT
WATER STERILE IRR 1000ML POUR (IV SOLUTION) ×6 IMPLANT

## 2013-02-27 NOTE — Progress Notes (Signed)
PCA fentanyl initiated, patient educated  ETCO2 = 35

## 2013-02-27 NOTE — Progress Notes (Signed)
Spoke with Dr. Jean Rosenthal regarding this patients pain and BP.  Dr. Jean Rosenthal ordered to continue narcotics, but said she wanted to give no pain medication at this time.

## 2013-02-27 NOTE — Transfer of Care (Signed)
Immediate Anesthesia Transfer of Care Note  Patient: Anne [name]  Procedure(s) Performed: Procedure(s): THOROCOTOMY WITH BI- LOBECTOMY (Right)  Patient Location: PACU  Anesthesia Type:General  Level of Consciousness: lethargic and responds to stimulation  Airway & Oxygen Therapy: Patient Spontanous Breathing and Patient connected to nasal cannula oxygen  Post-op Assessment: Report given to PACU RN  Post vital signs: Reviewed and stable  Complications: No apparent anesthesia complications

## 2013-02-27 NOTE — Brief Op Note (Addendum)
02/27/2013  1:36 PM  PATIENT:  Anne [name]  67 y.o. female  PRE-OPERATIVE DIAGNOSIS:  RIGHT LUNG MASS  POST-OPERATIVE DIAGNOSIS:  RIGHT LUNG MASS  PROCEDURE:  Procedure(s):  THOROCOTOMY WITH BI- LOBECTOMY (Right) -Right Lower Lobectomy -Right Middle Lobectomy -En bloc resection of Diaphragm  SURGEON:  Surgeon(s) and Role:    * Loreli Slot, MD - Primary  PHYSICIAN ASSISTANT: Lowella Dandy PA-C  ANESTHESIA:   general  EBL:  Total I/O In: 3250 [I.V.:3000; IV Piggyback:250] Out: 1825 [Urine:1125; Blood:700]  BLOOD ADMINISTERED:none  DRAINS: Right Pleural Chest tube x 2   LOCAL MEDICATIONS USED:  MARCAINE     SPECIMEN:  Source of Specimen:  Right Lower Lobe, Right Middle Lobe, Diagphragm  DISPOSITION OF SPECIMEN:  PATHOLOGY  COUNTS:  YES  PLAN OF CARE: Admit to inpatient   PATIENT DISPOSITION:  ICU - intubated and hemodynamically stable.   Delay start of Pharmacological VTE agent (>24hrs) due to surgical blood loss or risk of bleeding: yes  FINDINGS- severe adhesions within pleural space. Mass in lateral aspect of RLL, Densely adherent to diaphragm and lateral CW. RML densely adherent as well- could not establish a plane in the fissure, so portions of diaphragm, a small portion of the chest wall and the RML were taken en bloc. Margins negative for tumor. Still viable tumor cells on frozen of mass

## 2013-02-27 NOTE — H&P (View-Only) (Signed)
HPI:  [name] today to discuss management of her right lung metastasis.  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 This was proven by biopsy. She started chemotherapy. She then developed a right empyema and required thoracoscopic drainage. She recovered well from that procedure. She's now had 5 cycles of chemotherapy. No further chemotherapy is planned at this point.  Dr. Park [name] has talked to the colorectal surgeons Dr. Karis [name] and Anne [name] at [facility]. The plan that they have formulated and they feel gives her the best overall chance of survival is to 1) resect the lung metastasis, 2) pelvic radiation and then 3) low anterior resection.  I discussed this with [name] and she wishes to proceed with that plan.  Past Medical History  Diagnosis Date  . Postmenopausal HRT (hormone replacement therapy) 07/01/2011    surgical menopause  . Hx of hyperglycemia 07/01/2011  . Hx of compression fracture of spine 1985    t12 after a  20 foot fall  . Hx of fracture of wrist     after fall   . History of renal stone 6 12    Anne [name]  . Hx of colonic polyps   . Early cataract     stoneburner   . High frequency hearing loss     [facility] followed   . Diabetes mellitus without complication   . Complication of anesthesia     pt hadpanic attacks with versed, no versed  . Status post chemotherapy     completed a total of three cycles of Folfox   . Colon cancer     rectal cancer with lung metastasis  . Skin cancer     multiple  . Heart murmur, systolic 07/01/2011    innocent per Dr. Elijah [name]       Current Outpatient Prescriptions  Medication Sig Dispense Refill  . dexamethasone (DECADRON) 4 MG tablet Take 2 tablets (8 mg total) by mouth daily. To stimulate appetite.  30 tablet  6  . estradiol (MINIVELLE) 0.0375 MG/24HR Place 1 patch onto the skin 2 (two) times a week. Sundays and Thursdays      . metFORMIN (GLUCOPHAGE) 1000  MG tablet Take 1,000 mg by mouth 2 (two) times daily with a meal.      . Multiple Vitamin (MULTIVITAMIN) tablet Take 1 tablet by mouth daily. Senior      . omeprazole (PRILOSEC) 40 MG capsule Take 1 capsule (40 mg total) by mouth daily.  30 capsule  6  . ondansetron (ZOFRAN ODT) 8 MG disintegrating tablet Take 1 tablet (8 mg total) by mouth every 8 (eight) hours as needed for nausea.  30 tablet  2  . oxyCODONE-acetaminophen (PERCOCET/ROXICET) 5-325 MG per tablet Take 1-2 tablets by mouth every 4 (four) hours as needed.  80 tablet  0  . polyethylene glycol (MIRALAX / GLYCOLAX) packet Take 17 g by mouth daily. Taking in am and hs      . Probiotic Product (SOLUBLE FIBER/PROBIOTICS PO) Take 1 tablet by mouth daily.      . sitaGLIPtin (JANUVIA) 100 MG tablet Take 100 mg by mouth daily.       No current facility-administered medications for this visit.    Physical Exam BP 133/78  Pulse 94  Resp 16  Ht 5\' 4"  (1.626 m)  Wt 121 lb (54.885 kg)  BMI 20.76 kg/m2  SpO59 59% 67 year old woman in no acute distress Neurologic alert and oriented  x3, appropriate, no deficits Lungs clear with equal breath sounds bilaterally Cardiac regular rate and rhythm no murmur  Diagnostic Tests: CT chest 02/10/2013 CT CHEST WITH CONTRAST  TECHNIQUE:  Multidetector CT imaging of the chest was performed during  intravenous contrast administration.  CONTRAST: 80mL OMNIPAQUE IOHEXOL 300 MG/ML SOLN  COMPARISON: 01/05/2013 similar exam, chest radiograph 01/24/2013  FINDINGS:  Great vessels are normal in caliber. Heart size is normal. No  pericardial or pleural effusion. No mediastinal or measurable hilar  lymphadenopathy.  Incomplete imaging of the upper abdomen demonstrates  normal-appearing right adrenal gland and visualized portions of the  incompletely imaged left adrenal gland appear normal. Left-sided  Port-A-Cath in place with tip in the mid SVC. Thoracic spine disc  degenerative change noted without  interval development of lytic or  sclerotic osseous lesion. Stable L2 compression deformity.  Right upper lobe pulmonary nodule measures 6 mm maximally image 19,  previously 7 mm. Masslike pleural parenchymal consolidation within  the right upper lobe measures 5.0 x 5.7 cm compared to 4.3 x 3.9 cm  at the same anatomic level previously. Previously seen subcutaneous  gas has resolved.  4 mm left lower lobe pulmonary parenchymal nodule image 37 is  stable.  Right-sided pleural effusion has resolved.  5 mm right upper lobe pulmonary nodule image 17 is stable.  Subjective decrease in right upper lobe pleural parenchymal  thickening.  IMPRESSION:  Slight interval increase in size of dominant right upper lobe  pleural parenchymal masslike lesion. This may in part be due to  differences in configuration and or adjacent pleural thickening/  scarring, within the minimal with close followup as part of the  patient's staging protocol, or PET-CT may be helpful to determine  presence or absence of metabolic activity that could help further  differentiate between treatment effect and malignancy.  Pulmonary nodules are stable elsewhere.  Resolution of right pleural effusion.  Electronically Signed  By: Anne [name] M.D.  On: 02/10/2013 16:23  Impression: 67 year old woman with rectal cancer with a solitary lung metastasis. She's now had 5 cycles of chemotherapy. She has tolerated these well. She did develop a right-sided empyema and required surgical drainage. The plan now is to proceed with lung resection followed by pelvic radiation and then finally low anterior resection. Oncology, the colorectal surgeons and radiation oncology all feel that this gives her the best overall chance of cure and is the way to proceed.  I did discuss the proposed surgical procedure with Dr. Jacqulyn [name]. Given the size of this lesion and her previous surgery I am not optimistic that can be done in a minimally invasive  fashion. In fact it will probably require a full thoracotomy. We will proceed with right VATS, possible thoracotomy, resection of right lung mass on Monday, December 1. She does understand that there is a high likelihood of needing a thoracotomy and possibly rib removal. We discussed the general nature of the procedure, the need for general anesthesia, the expected hospital stay, and the overall recovery. She understands no guarantee can be given that this will be a curative resection. I do think we may have to do a lateral basilar segmentectomy to remove the mass. We discussed the indications, risks, benefits, and alternatives. She understands the risks include, but are not limited to, death, MI, DVT, PE, stroke, bleeding, possible need for transfusion, infection, respiratory failure, prolonged air leaks, as well as unforeseeable, occasions. She completely understands the risks, has no questions about them, and wishes to proceed as soon  as possible.  Plan: Right VATS, possible thoracotomy, resection of right lung mass on Monday, December 1

## 2013-02-27 NOTE — Anesthesia Preprocedure Evaluation (Addendum)
Anesthesia Evaluation  Patient identified by MRN, date of birth, ID band Patient awake    Reviewed: Allergy & Precautions, H&P , NPO status , Patient's Chart, lab work & pertinent test results  History of Anesthesia Complications Negative for: history of anesthetic complications  Airway Mallampati: I TM Distance: >3 FB Neck ROM: Full    Dental  (+) Teeth Intact, Implants, Caps and Dental Advisory Given   Pulmonary asthma ,  breath sounds clear to auscultation  Pulmonary exam normal       Cardiovascular negative cardio ROS  Rhythm:Regular Rate:Normal  '14 ECHO: EF 60-65%, mod TR, trivial pericardial effusion   Neuro/Psych  Headaches,    GI/Hepatic Neg liver ROS, GERD-  Medicated and Controlled,Colon cancer: chemo   Endo/Other  diabetes (diet managed, glu 162), Well Controlled, Type 2  Renal/GU negative Renal ROS     Musculoskeletal   Abdominal   Peds  Hematology  (+) Blood dyscrasia (Hb 11.1), anemia ,   Anesthesia Other Findings   Reproductive/Obstetrics                         Anesthesia Physical Anesthesia Plan  ASA: III  Anesthesia Plan: General   Post-op Pain Management:    Induction: Intravenous  Airway Management Planned: Double Lumen EBT  Additional Equipment: Arterial line and CVP  Intra-op Plan:   Post-operative Plan: Extubation in OR  Informed Consent: I have reviewed the patients History and Physical, chart, labs and discussed the procedure including the risks, benefits and alternatives for the proposed anesthesia with the patient or authorized representative who has indicated his/her understanding and acceptance.   Dental advisory given  Plan Discussed with: CRNA and Surgeon  Anesthesia Plan Comments: (Plan routine monitors, A line, CVP, GETA with double lumen ETT)        Anesthesia Quick Evaluation

## 2013-02-27 NOTE — Progress Notes (Signed)
S/p Right thoracotomy, bilobectomy  BP 162/89  Pulse 107  Temp(Src) 97.8 F (36.6 C) (Oral)  Resp 9  Ht 5' 4.57" (1.64 m)  Wt 122 lb 5.7 oz (55.5 kg)  BMI 20.64 kg/m2  SpO2 99%   Intake/Output Summary (Last 24 hours) at 02/27/13 1813 Last data filed at 02/27/13 1700  Gross per 24 hour  Intake   3750 ml  Output   2515 ml  Net   1235 ml    Pain is very well controlled

## 2013-02-27 NOTE — Preoperative (Signed)
Beta Blockers   Reason not to administer Beta Blockers:Not Applicable 

## 2013-02-27 NOTE — Anesthesia Procedure Notes (Signed)
Procedure Name: Intubation Date/Time: 02/27/2013 7:42 AM Performed by: Jefm Miles E Pre-anesthesia Checklist: Patient identified, Emergency Drugs available, Suction available, Patient being monitored and Timeout performed Patient Re-evaluated:Patient Re-evaluated prior to inductionOxygen Delivery Method: Circle system utilized Preoxygenation: Pre-oxygenation with 100% oxygen Intubation Type: IV induction Ventilation: Mask ventilation without difficulty Laryngoscope Size: Mac and 3 Grade View: Grade I Tube type: Oral Endobronchial tube: Left and Double lumen EBT and 37 Fr Number of attempts: 1 Airway Equipment and Method: Stylet and Fiberoptic brochoscope Placement Confirmation: ETT inserted through vocal cords under direct vision,  positive ETCO2 and breath sounds checked- equal and bilateral Secured at: 29 cm Tube secured with: Tape Dental Injury: Teeth and Oropharynx as per pre-operative assessment

## 2013-02-27 NOTE — Anesthesia Postprocedure Evaluation (Signed)
  Anesthesia Post-op Note  Patient: Anne [name]  Procedure(s) Performed: Procedure(s): THOROCOTOMY WITH BI- LOBECTOMY (Right)  Patient Location: PACU  Anesthesia Type:General  Level of Consciousness: awake, alert , oriented and patient cooperative  Airway and Oxygen Therapy: Patient Spontanous Breathing and Patient connected to nasal cannula oxygen  Post-op Pain: 3 /10, mild  Post-op Assessment: Post-op Vital signs reviewed, Patient's Cardiovascular Status Stable, Respiratory Function Stable, Patent Airway, No signs of Nausea or vomiting and Pain level controlled  Post-op Vital Signs: Reviewed and stable  Complications: No apparent anesthesia complications

## 2013-02-27 NOTE — Interval H&P Note (Signed)
History and Physical Interval Note:  02/27/2013 7:22 AM  Anne [name]  has presented today for surgery, with the diagnosis of RIGHT LUNG MASS  The various methods of treatment have been discussed with the patient and family. After consideration of risks, benefits and other options for treatment, the patient has consented to  Procedure(s): VIDEO ASSISTED THORACOSCOPY (VATS)/WEDGE RESECTION (Right) SEGMENTECTOMY (Right) as a surgical intervention .  The patient's history has been reviewed, patient examined, no change in status, stable for surgery.  I have reviewed the patient's chart and labs.  Questions were answered to the patient's satisfaction.     Yi Haugan C

## 2013-02-28 ENCOUNTER — Inpatient Hospital Stay (HOSPITAL_COMMUNITY): Payer: 59

## 2013-02-28 LAB — BASIC METABOLIC PANEL
Calcium: 8.6 mg/dL (ref 8.4–10.5)
Creatinine, Ser: 0.34 mg/dL — ABNORMAL LOW (ref 0.50–1.10)
GFR calc Af Amer: >90 mL/min (ref >90)
GFR calc non Af Amer: >90 mL/min (ref >90)
Glucose, Bld: 106 mg/dL — ABNORMAL HIGH (ref 70–99)
Potassium: 4 mEq/L (ref 3.5–5.1)
Sodium: 128 mEq/L — ABNORMAL LOW (ref 135–145)

## 2013-02-28 LAB — CBC
HCT: 26.1 % — ABNORMAL LOW (ref 36.0–46.0)
Hemoglobin: 8.9 g/dL — ABNORMAL LOW (ref 12.0–15.0)
MCH: 28.4 pg (ref 26.0–34.0)
MCHC: 34.1 g/dL (ref 30.0–36.0)
Platelets: 138 10*3/uL — ABNORMAL LOW (ref 150–400)
RDW: 22.6 % — ABNORMAL HIGH (ref 11.5–15.5)

## 2013-02-28 LAB — GLUCOSE, CAPILLARY
Glucose-Capillary: 106 mg/dL — ABNORMAL HIGH (ref 70–99)
Glucose-Capillary: 151 mg/dL — ABNORMAL HIGH (ref 70–99)
Glucose-Capillary: 217 mg/dL — ABNORMAL HIGH (ref 70–99)

## 2013-02-28 LAB — POCT I-STAT 3, ART BLOOD GAS (G3+)
Acid-Base Excess: 5 mmol/L — ABNORMAL HIGH (ref 0.0–2.0)
Bicarbonate: 28.6 mEq/L — ABNORMAL HIGH (ref 20.0–24.0)
O2 Saturation: 98 %
TCO2: 30 mmol/L (ref 0–100)
pCO2 arterial: 36.7 mmHg (ref 35.0–45.0)
pH, Arterial: 7.5 — ABNORMAL HIGH (ref 7.350–7.450)
pO2, Arterial: 90 mmHg (ref 80.0–100.0)

## 2013-02-28 MED ORDER — SODIUM CHLORIDE 0.9 % IV SOLN
INTRAVENOUS | Status: DC
  Administered 2013-02-28: 10:00:00 via INTRAVENOUS

## 2013-02-28 MED ORDER — ENOXAPARIN SODIUM 40 MG/0.4ML ~~LOC~~ SOLN
40.0000 mg | SUBCUTANEOUS | Status: DC
  Administered 2013-02-28 – 2013-03-04 (×5): 40 mg via SUBCUTANEOUS
  Filled 2013-02-28 (×6): qty 0.4

## 2013-02-28 MED ORDER — ONDANSETRON 8 MG PO TBDP
8.0000 mg | ORAL_TABLET | Freq: Three times a day (TID) | ORAL | Status: DC | PRN
Start: 2013-02-28 — End: 2013-03-04
  Filled 2013-02-28: qty 1

## 2013-02-28 MED ORDER — DEXAMETHASONE 4 MG PO TABS
8.0000 mg | ORAL_TABLET | Freq: Every day | ORAL | Status: DC
  Administered 2013-02-28 – 2013-03-03 (×4): 8 mg via ORAL
  Filled 2013-02-28 (×5): qty 2

## 2013-02-28 MED ORDER — INSULIN ASPART 100 UNIT/ML ~~LOC~~ SOLN: 0.0000 [IU] | Freq: Three times a day (TID) | SUBCUTANEOUS | Status: DC

## 2013-02-28 MED ORDER — ESTRADIOL 0.0375 MG/24HR TD PTTW
1.0000 | MEDICATED_PATCH | TRANSDERMAL | Status: DC
  Filled 2013-02-28 (×2): qty 1

## 2013-02-28 MED ORDER — LINAGLIPTIN 5 MG PO TABS
5.0000 mg | ORAL_TABLET | Freq: Every day | ORAL | Status: DC
  Administered 2013-02-28 – 2013-03-04 (×5): 5 mg via ORAL
  Filled 2013-02-28 (×5): qty 1

## 2013-02-28 MED ORDER — LEVALBUTEROL HCL 0.63 MG/3ML IN NEBU
0.6300 mg | INHALATION_SOLUTION | Freq: Four times a day (QID) | RESPIRATORY_TRACT | Status: DC
  Administered 2013-02-28 – 2013-03-02 (×8): 0.63 mg via RESPIRATORY_TRACT
  Filled 2013-02-28 (×15): qty 3

## 2013-02-28 MED ORDER — INSULIN ASPART 100 UNIT/ML ~~LOC~~ SOLN
0.0000 [IU] | Freq: Three times a day (TID) | SUBCUTANEOUS | Status: DC
  Administered 2013-02-28 (×2): 5 [IU] via SUBCUTANEOUS
  Administered 2013-02-28: 3 [IU] via SUBCUTANEOUS

## 2013-02-28 MED ORDER — INSULIN ASPART 100 UNIT/ML ~~LOC~~ SOLN
0.0000 [IU] | Freq: Three times a day (TID) | SUBCUTANEOUS | Status: DC
  Administered 2013-02-28: 8 [IU] via SUBCUTANEOUS
  Administered 2013-03-01: 3 [IU] via SUBCUTANEOUS
  Administered 2013-03-01: 19:00:00 via SUBCUTANEOUS
  Administered 2013-03-01 (×2): 5 [IU] via SUBCUTANEOUS
  Administered 2013-03-02: 8 [IU] via SUBCUTANEOUS
  Administered 2013-03-02: 3 [IU] via SUBCUTANEOUS
  Administered 2013-03-02: 8 [IU] via SUBCUTANEOUS
  Administered 2013-03-02: 3 [IU] via SUBCUTANEOUS
  Administered 2013-03-03: 5 [IU] via SUBCUTANEOUS
  Administered 2013-03-03 – 2013-03-04 (×4): 2 [IU] via SUBCUTANEOUS

## 2013-02-28 NOTE — Progress Notes (Signed)
Transferred to 3S01 via wheelchair. Portable monitor on. No changes.

## 2013-02-28 NOTE — Op Note (Signed)
NAMEKELCEE, BJORN NO.:  [id number]  MEDICAL RECORD NO.:  [id number]  LOCATION:  2S12C                        FACILITY:  [facility]  PHYSICIAN:  Salvatore Decent. Dorris Fetch, M.D.DATE OF BIRTH:  [birth year]  DATE OF PROCEDURE:  02/27/2013 DATE OF DISCHARGE:                              OPERATIVE REPORT   PREOPERATIVE DIAGNOSIS:  Metastatic colorectal cancer to right lower lobe.  POSTOPERATIVE DIAGNOSIS:  Metastatic colorectal cancer to right lower lobe.  PROCEDURE:  Right thoracotomy, bilobectomy (right lower and middle lobectomy) with en-bloc resection of diaphragm, On-Q local anesthetic catheter placement.  SURGEON:  Salvatore Decent. Dorris Fetch, MD  ASSISTANT:  Lowella Dandy, PA  ANESTHESIA:  General.  FINDINGS:  Initial placement of thoracoscope was intra-abdominal. Severe intrapleural adhesions requiring open thoracotomy. Mass adherent to lateral chest wall as well as diaphragm, resected en bloc to ensure clear margins. Right middle lobe also densely adherent to mass, bilobectomy performed as no clear plane could be developed between the lower and middle lobes.  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  She was started on chemotherapy. She had developed a right empyema requiring thoracoscopic drainage.  She has now completed chemotherapy.  The plan after discussion with all the specialties was to undergo resection of the lung metastasis followed by pelvic radiation and then subsequently a low anterior resection.  The patient wished to proceed with this plan as outlined.  She understood that the operation would likely be complicated by her previous empyema, plan was to attempt video-assisted thoracoscopic resection, however it was felt likely a thoracotomy would be necessary.  Resection would be the minimal required to achieve adequate margins.  She did understand the lobectomy  might be necessary. The indications, risks, benefits, and alternatives were discussed in detail with [name], she accepted the risk and agreed to proceed.  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  Intravenous antibiotics were administered.  She was taken to the operating room, anesthetized, and intubated with a double-lumen endotracheal tube, a Foley catheter was placed.  Sequential compression devices were placed on the calves for DVT prophylaxis.  She was placed in a left lateral decubitus position and the right chest was prepped and draped in the usual sterile fashion. Single lung ventilation of the left lung was initiated and was tolerated well throughout the procedure.  An incision was made through the patient's previous chest tube site, it was carried through the skin and subcutaneous tissue.  The interspace above the incision was entered bluntly using a hemostat.  A port was inserted and the thoracoscope was placed into what was thought to be the chest, but this subsequently turned out to be the peritoneum.  The liver was identified as well as the diaphragm.  A small lateral thoracotomy was made through the patient's previous incision from a prior breast lift.  The chest was entered in that area, and there were extensive adhesions noted, these were taken down with electrocautery between the lung and the diaphragm  anteriorly and as far as possible laterally. The adhesions became dense more laterally in the area of the tumor and were not taken down at this time.  It was clear that a thoracoscopic resection would not be possible and the incision was lengthened, a segment of rib was removed to improve exposure.  A thoracoscope was placed into the chest and used to help with visualization particularly apically and anteriorly where  the visualization was otherwise poor.  The adhesions were taken down circumferentially, this took considerable period of time, but ultimately the lung was freed everywhere except in the area of the mass itself. The major fissure between the superior segment of the upper lobe was identified and completed with electrocautery.  The major fissure between the lower and middle lobes really could not be discerned at this point in time.  The lesion was adherent to the chest wall in the vicinity of the incision.  An extrapleural dissection was carried out in this area incorporating the intercostal muscles in the area where it was adherent, But I was able to get around the mass and free it up from the chest wall without having to resect ribs en bloc.  At this point, it was clear that the lesion was densely adherent to the diaphragm and no plane could be developed there.  A portion of the diaphragm was taken en bloc with the resection as well.  After this was done, the lesion was pretty well freed up.  Palpation of the lesion showed it really was not going to be possible to do a basilar segmentectomy, the superior segment that would be left would be small and of minimal benefit.  The major fissure was identified but a clear plane could not be developed all the way through as the mass was in close proximity to the fissure and we would not be able to achieve an adequate margin.  The inferior pulmonary vein was identified.  It was encircled and divided with an endoscopic vascular stapler.  The lower lobe basilar segmental artery was encircled and divided prior to the final decision that the superior segment should be taken as well.  The superior segmental artery was ultimately taken separately, it was also divided with an endoscopic vascular stapler.  Multiple level 11 nodes were identified during this portion of dissection, these were sent as separate specimens. All appeared grossly normal.   The bronchus intermedius was then encircled as it was clear that there would be no benefit to preserving the superior segmental portion of the lower lobe and it was also clear that this could potentially compromise the margin as the mass was palpable deep into the lobe.  A stapler was placed across the bronchus intermedius and closed. A test inflation showed good aeration in the upper lobe.  The stapler was fired, dividing the bronchus intermedius.  The specimen then was removed through the thoracotomy incision.  The chest wall margin was marked with a suture. The lower and middle lobes were sent for frozen section of the mass and the margins. Inspection of the defect in the diaphragm showed some questionable nodularity along the margins medially and laterally.  These areas were resected and sent for frozen section to ensure that there was no tumor at the margins.  All the margins subsequently came back free of tumor.  There was noted to be viable tumor in the mass itself. The diaphragmatic defect then was closed with interrupted #1 PDS figure-of- eight sutures, it came together easily without  tension.  The chest was copiously irrigated with warm saline.  Hemostasis was achieved.  An On-Q local anesthetic catheter was passed through a separate stab incision posteriorly and placed into a subpleural location for administration of long-acting local anesthetics.  The 2 previous chest tube sites from her empyema surgery were utilized for placement of a 28-French straight chest tube and a 36-French right-angled chest tube.  The right upper lobe was reinflated.  The chest tubes were secured.  The thoracotomy was closed with #2 Vicryl pericostal sutures followed by #1 Vicryl fascial sutures to reapproximate the serratus and close the latissimus. The Subcutaneous tissue and skin were closed in standard fashion.  All sponge, needle,and instrument counts were correct at the end of the procedure.   The patient was placed back in supine position.  She was extubated in the operating room and taken to the postanesthetic care unit in good condition.     Salvatore Decent Dorris Fetch, M.D.     SCH/MEDQ  D:  02/27/2013  T:  02/28/2013  Job:  [id number]

## 2013-02-28 NOTE — Progress Notes (Signed)
UR Completed.  Anne [name] [phone] 02/28/2013

## 2013-02-28 NOTE — Progress Notes (Signed)
1 Day Post-Op Procedure(s) (LRB): THOROCOTOMY WITH BI- LOBECTOMY (Right) Subjective: Some pain, but well controlled with PCA- mostly left shoulder- "already getting better" Denies nausea  Objective: Vital signs in last 24 hours: Temp:  [97.6 F (36.4 C)-98.6 F (37 C)] 98.2 F (36.8 C) (12/02 0753) Pulse Rate:  [73-116] 83 (12/02 0700) Cardiac Rhythm:  [-] Normal sinus rhythm (12/02 0400) Resp:  [8-22] 19 (12/02 0700) BP: (85-191)/(41-101) 148/64 mmHg (12/02 0700) SpO2:  [96 %-100 %] 98 % (12/02 0700) Arterial Line BP: (95-209)/(46-101) 140/62 mmHg (12/02 0700) Weight:  [122 lb 5.7 oz (55.5 kg)] 122 lb 5.7 oz (55.5 kg) (12/01 1024)  Hemodynamic parameters for last 24 hours:    Intake/Output from previous day: 12/01 0701 - 12/02 0700 In: 5673 [P.O.:540; I.V.:4783; IV Piggyback:350] Out: 3760 [Urine:1950; Blood:700; Chest Tube:1110] Intake/Output this shift:    General appearance: alert and no distress Neurologic: intact Heart: regular rate and rhythm Lungs: diminished breath sounds right base Abdomen: normal findings: soft, non-tender + air leak, serosanguinous drainage  Lab Results:  Recent Labs  02/27/13 1247 02/28/13 0430  WBC  --  14.1*  HGB 8.5* 8.9*  HCT 25.0* 26.1*  PLT  --  138*   BMET:  Recent Labs  02/27/13 1247 02/28/13 0430  NA 133* 128*  K 4.6 4.0  CL  --  93*  CO2  --  25  GLUCOSE 264* 106*  BUN  --  14  CREATININE  --  0.34*  CALCIUM  --  8.6    PT/INR: No results found for this basename: LABPROT, INR,  in the last 72 hours ABG    Component Value Date/Time   PHART 7.500* 02/28/2013 0415   HCO3 28.6* 02/28/2013 0415   TCO2 30 02/28/2013 0415   ACIDBASEDEF 1.7 02/24/2013 1039   O2SAT 98.0 02/28/2013 0415   CBG (last 3)   Recent Labs  02/27/13 2004 02/27/13 2344 02/28/13 0412  GLUCAP 136* 82 106*    Assessment/Plan: S/P Procedure(s) (LRB): THOROCOTOMY WITH BI- LOBECTOMY (Right) Plan for transfer to step-down: see transfer  orders POD # 1   Looks great  CV- stable  RESP- keep both CT- will try on water seal  Nebs, IS  RENAL- hyponatremia- change to NS  Pain control- On-Q, PCA  Ambulate  DVT prophylaxis- SCD + enoxaparin  ENDO- restart decadron  Restart januvia-   Restart metformin tomorrow if appropriate based on renal function and PO intake   LOS: 1 day    Khan Chura C 02/28/2013

## 2013-02-28 NOTE — Progress Notes (Signed)
Patient ID: Anne [name], female   DOB: [birth year], 67 y.o.   MRN: [id number]   SICU Evening Rounds:   Hemodynamically stable  Up walking around    Urine output good  CT output low  CBC    Component Value Date/Time   WBC 14.1* 02/28/2013 0430   WBC 6.6 02/13/2013 0755   RBC 3.13* 02/28/2013 0430   RBC 4.16 02/13/2013 0755   HGB 8.9* 02/28/2013 0430   HGB 10.7* 02/13/2013 0755   HGB 10.0* 11/02/2012 1701   HCT 26.1* 02/28/2013 0430   HCT 33.4* 02/13/2013 0755   PLT 138* 02/28/2013 0430   PLT 215 02/13/2013 0755   MCV 83.4 02/28/2013 0430   MCV 80.3 02/13/2013 0755   MCH 28.4 02/28/2013 0430   MCH 25.7 02/13/2013 0755   MCHC 34.1 02/28/2013 0430   MCHC 32.0 02/13/2013 0755   RDW 22.6* 02/28/2013 0430   RDW 21.6* 02/13/2013 0755   LYMPHSABS 3.2 02/13/2013 0755   LYMPHSABS 0.7 01/28/2013 1615   MONOABS 0.6 02/13/2013 0755   MONOABS 0.2 01/28/2013 1615   EOSABS 0.0 02/13/2013 0755   EOSABS 0.0 01/28/2013 1615   BASOSABS 0.0 02/13/2013 0755   BASOSABS 0.0 01/28/2013 1615     BMET    Component Value Date/Time   NA 128* 02/28/2013 0430   NA 136 02/13/2013 0755   K 4.0 02/28/2013 0430   K 4.4 02/13/2013 0755   CL 93* 02/28/2013 0430   CO2 25 02/28/2013 0430   CO2 26 02/13/2013 0755   GLUCOSE 106* 02/28/2013 0430   GLUCOSE 162* 02/13/2013 0755   BUN 14 02/28/2013 0430   BUN 10.5 02/13/2013 0755   CREATININE 0.34* 02/28/2013 0430   CREATININE 0.7 02/13/2013 0755   CALCIUM 8.6 02/28/2013 0430   CALCIUM 10.4 02/13/2013 0755   GFRNONAA >90 02/28/2013 0430   GFRAA >90 02/28/2013 0430     A/P:  Stable postop course. Continue current plans

## 2013-03-01 ENCOUNTER — Inpatient Hospital Stay (HOSPITAL_COMMUNITY): Payer: 59

## 2013-03-01 ENCOUNTER — Encounter (HOSPITAL_COMMUNITY): Payer: Self-pay | Admitting: Thoracic Surgery (Cardiothoracic Vascular Surgery)

## 2013-03-01 LAB — GLUCOSE, CAPILLARY
Glucose-Capillary: 300 mg/dL — ABNORMAL HIGH (ref 70–99)
Glucose-Capillary: 208 mg/dL — ABNORMAL HIGH (ref 70–99)
Glucose-Capillary: 200 mg/dL — ABNORMAL HIGH (ref 70–99)
Glucose-Capillary: 244 mg/dL — ABNORMAL HIGH (ref 70–99)

## 2013-03-01 LAB — CBC
Hemoglobin: 8.7 g/dL — ABNORMAL LOW (ref 12.0–15.0)
MCH: 28.1 pg (ref 26.0–34.0)
MCV: 83.2 fL (ref 78.0–100.0)
Platelets: 179 10*3/uL (ref 150–400)
RBC: 3.1 MIL/uL — ABNORMAL LOW (ref 3.87–5.11)
RDW: 22.8 % — ABNORMAL HIGH (ref 11.5–15.5)
WBC: 12 10*3/uL — ABNORMAL HIGH (ref 4.0–10.5)

## 2013-03-01 LAB — AFB CULTURE WITH SMEAR (NOT AT ARMC): Acid Fast Smear: NONE SEEN

## 2013-03-01 LAB — COMPREHENSIVE METABOLIC PANEL
ALT: 33 U/L (ref 0–35)
AST: 17 U/L (ref 0–37)
Albumin: 2.7 g/dL — ABNORMAL LOW (ref 3.5–5.2)
Calcium: 9.1 mg/dL (ref 8.4–10.5)
Chloride: 95 mEq/L — ABNORMAL LOW (ref 96–112)
Creatinine, Ser: 0.34 mg/dL — ABNORMAL LOW (ref 0.50–1.10)
Sodium: 131 mEq/L — ABNORMAL LOW (ref 135–145)
Total Bilirubin: 0.3 mg/dL (ref 0.3–1.2)

## 2013-03-01 MED ORDER — METFORMIN HCL 500 MG PO TABS
1000.0000 mg | ORAL_TABLET | Freq: Two times a day (BID) | ORAL | Status: DC
  Administered 2013-03-01 – 2013-03-04 (×6): 1000 mg via ORAL
  Filled 2013-03-01 (×8): qty 2

## 2013-03-01 NOTE — Progress Notes (Addendum)
Wasted 22ml of Fentanyl PCA  w/ A. Troy Sine, RN

## 2013-03-01 NOTE — Progress Notes (Signed)
HS CBG 300. Also RN was told in report that the chest tubes had a moderate air leak on water seal, upon assessment it appeared that a large air leak was present. Dr. Laneta Simmers notified. Order received for HS coverage. Will continue to monitor.   Rochele Pages, RN

## 2013-03-01 NOTE — Progress Notes (Addendum)
[address]       [place] [address]             [phone]          2 Days Post-Op Procedure(s) (LRB): THOROCOTOMY WITH BI- LOBECTOMY (Right)  Subjective: Not having any pain, wants to get rid of PCA.  Eating well.  Hyperglycemic last night after eating a cinnamon roll.  No complaints. Walking in hall without difficulty.   Objective: Vital signs in last 24 hours: Patient Vitals for the past 24 hrs:  BP Temp Temp src Pulse Resp SpO2  03/01/13 0500 130/67 mmHg 98.7 F (37.1 C) Oral 86 19 100 %  03/01/13 0000 173/76 mmHg 98.3 F (36.8 C) Oral 114 34 100 %  02/28/13 2053 - - - - - 100 %  02/28/13 1904 147/90 mmHg 98.8 F (37.1 C) Oral 87 18 100 %  02/28/13 1800 148/82 mmHg - - 107 21 99 %  02/28/13 1700 157/74 mmHg - - 111 18 99 %  02/28/13 1654 - 97.9 F (36.6 C) Oral - - -  02/28/13 1632 - - - - - 100 %  02/28/13 1600 158/80 mmHg - - 85 20 100 %  02/28/13 1500 - - - 106 17 99 %  02/28/13 1400 156/65 mmHg - - 72 13 99 %  02/28/13 1300 147/85 mmHg - - 81 16 99 %  02/28/13 1228 - 97.7 F (36.5 C) Axillary - - -  02/28/13 1200 171/70 mmHg - - 80 20 100 %  02/28/13 1100 - - - 83 25 100 %  02/28/13 1000 146/67 mmHg - - 70 15 100 %  02/28/13 0900 133/73 mmHg - - 77 12 100 %  02/28/13 0800 - - - 86 18 100 %  02/28/13 0753 - 98.2 F (36.8 C) Oral - - -   Current Weight  02/27/13 122 lb 5.7 oz (55.5 kg)     Intake/Output from previous day: 12/02 0701 - 12/03 0700 In: 1735.5 [P.O.:840; I.V.:895.5] Out: 3805 [Urine:3335; Chest Tube:470]  CBGs 217-244-300    PHYSICAL EXAM:  Heart: RRR Lungs: Clear Wound: Dressed and dry, has had some sanguinous drainage from On-Q site, but none currently Chest tube: + 7/7 air leak only with cough, none with talking or normal respiration    Lab Results: CBC: Recent Labs  02/27/13 1247 02/28/13 0430  WBC  --  14.1*  HGB 8.5* 8.9*  HCT 25.0* 26.1*  PLT  --  138*   BMET:  Recent Labs  02/27/13 1247 02/28/13 0430  NA 133* 128*  K 4.6 4.0  CL  --  93*  CO2  --  25  GLUCOSE 264* 106*  BUN  --  14  CREATININE  --  0.34*  CALCIUM  --  8.6    PT/INR: No results found for this basename: LABPROT, INR,  in the last 72 hours  CXR: FINDINGS:  Two right chest tubes unchanged in position. Probable right basilar  pneumothorax is more prominent on the current study. Right lower  lobe atelectasis is present.  Right jugular catheter tip in the right atrium. Port-A-Cath tip in  the SVC.  Left lung is clear. Negative for heart failure.  IMPRESSION:  Right basilar pneumothorax. Right lower lobe atelectasis unchanged.   Assessment/Plan: S/P Procedure(s) (LRB): THOROCOTOMY WITH BI- LOBECTOMY (Right)  Chest tube with large air leak with cough.  CXR with basilar ptx.  Continue CT to water seal for  now and monitor.  DM- hyperglycemia, likely related to increased carb intake overnight and po steroids.  Will continue Januvia and SSI, resume po metformin and watch.  Excellent pain control with po Oxycodone, and pt requests PCA be d/c'ed.  Will stop PCA and use po and IV pain meds prn.  Hyponatremia- am labs have been drawn but not resulted from lab.  Will follow up and may be able to saline lock IVF.  Continue ambulation, pulm toilet.   LOS: 2 days    COLLINS,GINA H 03/01/2013  Patient seen and examined, agree with above except would not classify air leak as large The right basilar pneumo is free air under diaphragm from resection Will dc anterior CT, keep posterior CT until air leak resolved and drainage down

## 2013-03-01 NOTE — Progress Notes (Signed)
Anterior CT removed; no problems; vss; will continue to montior.  Vivi Martens RN

## 2013-03-02 ENCOUNTER — Encounter: Payer: Self-pay | Admitting: Radiation Oncology

## 2013-03-02 ENCOUNTER — Inpatient Hospital Stay (HOSPITAL_COMMUNITY): Payer: 59

## 2013-03-02 LAB — BASIC METABOLIC PANEL
BUN: 13 mg/dL (ref 6–23)
CO2: 27 mEq/L (ref 19–32)
Chloride: 97 mEq/L (ref 96–112)
Glucose, Bld: 191 mg/dL — ABNORMAL HIGH (ref 70–99)
Potassium: 4 mEq/L (ref 3.5–5.1)

## 2013-03-02 LAB — GLUCOSE, CAPILLARY: Glucose-Capillary: 161 mg/dL — ABNORMAL HIGH (ref 70–99)

## 2013-03-02 MED ORDER — LEVALBUTEROL HCL 0.63 MG/3ML IN NEBU
0.6300 mg | INHALATION_SOLUTION | Freq: Two times a day (BID) | RESPIRATORY_TRACT | Status: DC
  Administered 2013-03-02 – 2013-03-03 (×2): 0.63 mg via RESPIRATORY_TRACT
  Filled 2013-03-02 (×8): qty 3

## 2013-03-02 NOTE — Progress Notes (Addendum)
       [address]       [place] [address]             [phone]          3 Days Post-Op Procedure(s) (LRB): THOROCOTOMY WITH BI- LOBECTOMY (Right)  Subjective: Comfortable, no complaints.  Walked already in the halls. Pain controlled on po meds.   Objective: Vital signs in last 24 hours: Patient Vitals for the past 24 hrs:  BP Temp Temp src Pulse Resp SpO2  03/02/13 0731 - - - - - 98 %  03/02/13 0432 144/74 mmHg 97.9 F (36.6 C) Oral 82 12 99 %  03/02/13 0011 143/82 mmHg 97.9 F (36.6 C) Oral 96 18 100 %  03/01/13 1958 140/67 mmHg 98 F (36.7 C) Oral 105 14 99 %  03/01/13 1624 - 98.4 F (36.9 C) Oral - - -  03/01/13 1237 - 98.5 F (36.9 C) Oral - - -   Current Weight  02/27/13 122 lb 5.7 oz (55.5 kg)     Intake/Output from previous day: 12/03 0701 - 12/04 0700 In: -  Out: 750 [Urine:700; Chest Tube:50]  CBGs [phone]    PHYSICAL EXAM:  Heart: RRR Lungs: Slightly decreased BS on R Wound: Clean and dry Chest tube: No definite air leak, but has tidaling in the columns   Lab Results: CBC: Recent Labs  02/28/13 0430 03/01/13 0500  WBC 14.1* 12.0*  HGB 8.9* 8.7*  HCT 26.1* 25.8*  PLT 138* 179   BMET:  Recent Labs  03/01/13 0500 03/02/13 0430  NA 131* 133*  K 4.2 4.0  CL 95* 97  CO2 27 27  GLUCOSE 166* 191*  BUN 9 13  CREATININE 0.34* 0.32*  CALCIUM 9.1 9.5    PT/INR: No results found for this basename: LABPROT, INR,  in the last 72 hours    Assessment/Plan: S/P Procedure(s) (LRB): THOROCOTOMY WITH BI- LOBECTOMY (Right) Chest tube with minimal drainage, no definite air leak.  CXR stable.  Continue CT to water seal for now. Hopefully can d/c soon. DM- still hyperglycemic, but only received 1 dose of metformin last night.  CBGs trending down this am.  Continue Metformin and Januvia, SSI prn. She has been on po steroids per Dr. Welton Flakes to help stimulate her appetite, and she has asked her daughter to check with Dr.  Welton Flakes to see if these can be discontinued, since she is eating well.  Continue ambulation, pulm toilet. Will d/c On-Q.    LOS: 3 days    COLLINS,GINA H 03/02/2013  Looks great, ambulated 6 laps around unit No air leak with respiration but a small amount of air came through with cough. Will keep tube one more day Path discussed with patient yesterday. Metastatic rectal cancer- margins clear

## 2013-03-02 NOTE — Progress Notes (Signed)
MRI disc from [facility] placed on Dr. Broadus John desk for review to determine if fusion is possible. Dr. Kathrynn Running aware.

## 2013-03-02 NOTE — Progress Notes (Signed)
Inpatient Diabetes Program Recommendations  AACE/ADA: New Consensus Statement on Inpatient Glycemic Control (2013)  Target Ranges:  Prepandial:   less than 140 mg/dL      Peak postprandial:   less than 180 mg/dL (1-2 hours)      Critically ill patients:  140 - 180 mg/dL     Results for Anne [name], Anne [name] (MRN [id number]) as of 03/02/2013 13:14  Ref. Range 03/02/2013 07:54 03/02/2013 11:43  Glucose-Capillary Latest Range: 70-99 mg/dL 784 (H) 696 (H)    **CBGs improved today.  Patient was restarted on home doses of Metformin last night.  Patient also getting Tradgenta and Novolog Moderate SSI at present.  **MD- May want to switch patient to Carbohydrate Modified diet to limit carbohydrates with each meal (patient currently on Regular diet with no carbohydrate limitations)    Will follow. Ambrose Finland RN, MSN, CDE Diabetes Coordinator Inpatient Diabetes Program Team Pager: [phone] (8a-10p)

## 2013-03-02 NOTE — Progress Notes (Signed)
Came to visit patient on behalf of [employer] program for [employer] employees/dependents with [employer]. Dr Ferd Glassing is well known to this Clinical research associate. Will continue follow along and will follow up post hospital discharge via phone. Confirmed best contact numbers and states she wishes for this writer to contact her sister regarding ensure plus supplements that are provided by [facility] at low cost.  Raiford Noble, MSN-Ed, RN,BSN- [facility] [phone]

## 2013-03-03 ENCOUNTER — Inpatient Hospital Stay (HOSPITAL_COMMUNITY): Payer: 59

## 2013-03-03 LAB — GLUCOSE, CAPILLARY
Glucose-Capillary: 122 mg/dL — ABNORMAL HIGH (ref 70–99)
Glucose-Capillary: 236 mg/dL — ABNORMAL HIGH (ref 70–99)
Glucose-Capillary: 137 mg/dL — ABNORMAL HIGH (ref 70–99)

## 2013-03-03 NOTE — Progress Notes (Signed)
Tolerated chest tube clamping without complaint  CXR essentially stable, decreased per radiology but when takin angle of film into account I don't think there is much difference  CT with a few air bubbles after unclamping but no ongoing leak  Will dc CT  She is aware there is a small risk it could need to be reinserted

## 2013-03-03 NOTE — Progress Notes (Signed)
Inpatient Diabetes Program Recommendations  AACE/ADA: New Consensus Statement on Inpatient Glycemic Control (2013)  Target Ranges:  Prepandial:   less than 140 mg/dL      Peak postprandial:   less than 180 mg/dL (1-2 hours)      Critically ill patients:  140 - 180 mg/dL  Results for [name], [name] (MRN [id number]) as of 03/03/2013 11:16  Ref. Range 03/02/2013 11:43 03/02/2013 16:30 03/02/2013 17:57 03/02/2013 21:47 03/03/2013 08:24  Glucose-Capillary Latest Range: 70-99 mg/dL 409 (H) 811 (H) 914 (H) 264 (H) 122 (H)   Inpatient Diabetes Program Recommendations Insulin - Meal Coverage: consider adding Novolog with meals for postprandial hyperglycemia Thank you  Piedad Climes BSN, RN,CDE Inpatient Diabetes Coordinator [phone] (team pager)

## 2013-03-03 NOTE — Progress Notes (Signed)
Removed chest tube per MD order and followed policies and procedures. Patient tolerated procedure well. Post chest xray done. Will continue to monitor.  Anne [name]

## 2013-03-03 NOTE — Progress Notes (Addendum)
       [address]       [place] [address]             [phone]          4 Days Post-Op Procedure(s) (LRB): THOROCOTOMY WITH BI- LOBECTOMY (Right)  Subjective: Comfortable, no complaints.  Walked 10 laps around unit yesterday.  Pain controlled with alternating Oxy/Ultram.   Objective: Vital signs in last 24 hours: Patient Vitals for the past 24 hrs:  BP Temp Temp src Pulse Resp SpO2  03/03/13 0435 138/82 mmHg 97.7 F (36.5 C) Oral 78 17 100 %  03/02/13 2355 131/66 mmHg 97.9 F (36.6 C) Oral 87 17 100 %  03/02/13 1951 124/69 mmHg 98.1 F (36.7 C) Oral 101 22 100 %  03/02/13 1930 124/69 mmHg - - 108 20 99 %  03/02/13 1626 148/68 mmHg 97.3 F (36.3 C) Oral 89 24 100 %  03/02/13 1140 131/65 mmHg 98 F (36.7 C) Oral 86 14 99 %  03/02/13 0751 140/76 mmHg 97.6 F (36.4 C) Oral 94 17 100 %   Current Weight  02/27/13 122 lb 5.7 oz (55.5 kg)     Intake/Output from previous day: 12/04 0701 - 12/05 0700 In: 1500 [P.O.:1500] Out: 590 [Urine:450; Chest Tube:140]  CBGs [phone]    PHYSICAL EXAM:  Heart: RRR Lungs: Clear Wound: Incision intact and dry, CT site with serous drainage around tube Chest tube: Some tidaling in chamber with cough, no definite air leak    Lab Results: CBC: Recent Labs  03/01/13 0500  WBC 12.0*  HGB 8.7*  HCT 25.8*  PLT 179   BMET:  Recent Labs  03/01/13 0500 03/02/13 0430  NA 131* 133*  K 4.2 4.0  CL 95* 97  CO2 27 27  GLUCOSE 166* 191*  BUN 9 13  CREATININE 0.34* 0.32*  CALCIUM 9.1 9.5    PT/INR: No results found for this basename: LABPROT, INR,  in the last 72 hours    Assessment/Plan: S/P Procedure(s) (LRB): THOROCOTOMY WITH BI- LOBECTOMY (Right) CT still with some motion with cough, am CXR just getting ready to be done. Will follow up- may be able to d/c CT soon. DM- CBGs remain elevated, likely secondary to steroids.  She has been on a regular diet to help stimulate her appetite.  Continue po Januvia, Metformin, SSI prn.  May need to consider d/c steroids since she is eating better.   LOS: 4 days    COLLINS,GINA H 03/03/2013  Patient seen and examined, agree with above. She has a basilar space in addition to the pneumoperitoneum on CXR There is extreme tidalling in the tubing- will clamp tube and repeat CXR at noon- if OK will dc CT later today

## 2013-03-03 NOTE — Discharge Summary (Signed)
[address]       [place] [address]             [phone]              Discharge Summary  Name: Anne [name] DOB: 1945-08-26 67 y.o. MRN: [id number]   Admission Date: 02/27/2013 Discharge Date: 03/04/2013    Admitting Diagnosis: Right lung mass   Discharge Diagnosis:  Metastatic colorectal adenocarcinoma Expected postoperative blood loss anemia   Past Medical History  Diagnosis Date  . Postmenopausal HRT (hormone replacement therapy) 07/01/2011    surgical menopause  . Hx of hyperglycemia 07/01/2011  . Hx of compression fracture of spine 1985    t12 after a  20 foot fall  . Hx of fracture of wrist     after fall   . 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 Hx of colonic polyps   . Early cataract     stoneburner   . High frequency hearing loss     kraus followed   . Diabetes mellitus without complication   . Complication of anesthesia     pt hadpanic attacks with versed, no versed  . Status post chemotherapy     completed a total of three cycles of Folfox   . Colon cancer     rectal cancer with lung metastasis  . Skin cancer     multiple  . Heart murmur, systolic 07/01/2011    innocent per Dr. Elijah Birk   . Asthma     as a child none since 60 years old  . OZHYQMVH(846.9)      Procedures: RIGHT THORACOTOMY -02/27/2013  Right middle and lower lobectomies  En-bloc resection of diaphragm   HPI:  The patient is a 67 y.o. female with recently diagnosed rectal cancer with a biopsy proven solitary right lung metastasis.  She was started on chemotherapy, developed a right empyema requiring thoracoscopic drainage by Dr. Dorris Fetch. She has now completed chemotherapy. She was recently seen in follow up by Dr. Dorris Fetch for consideration of further surgical intervention.  The plan, after discussion with all the specialties involved in her care, was to undergo resection of the lung metastasis followed by pelvic radiation and then  subsequently a low anterior resection. The patient wished to proceed with this plan as outlined. She understood that the operation would likely be complicated by her previous empyema, plan was to attempt video-assisted thoracoscopic resection, however it was felt likely the thoracotomy would be necessary. Resection would be the minimal required to achieve adequate margins. She did understand the lobectomy might be necessary. The indications, risks, benefits, and alternatives were discussed in detail with [name], she accepted the risk and agreed to proceed.    Hospital Course:  The patient was admitted to [facility] on 02/27/2013.  The patient was taken to the operating room and underwent the above procedure.    The postoperative course has generally been uneventful.  She has had some hyperglycemia, which was felt to be due to her recent steroid use. She was restarted on her home medication regimen and sliding scale insulin was initiated. Her chest tubes have been removed in the standard fashion and follow up chest x-rays have remained stable, showing a small residual pneumothorax.  She is ambulating in the halls without difficulty, and tolerating a regular diet.  Incisions are healing well.  Pathology confirmed metastatic colorectal adenocarcinoma.  Sats are greater than 90% on  room air.  She is medically stable on today's date for discharge home.     Recent vital signs:  Filed Vitals:   03/03/13 0825  BP: 142/73  Pulse: 84  Temp:   Resp: 24    Recent laboratory studies:  CBC: Recent Labs  03/01/13 0500  WBC 12.0*  HGB 8.7*  HCT 25.8*  PLT 179   BMET:  Recent Labs  03/01/13 0500 03/02/13 0430  NA 131* 133*  K 4.2 4.0  CL 95* 97  CO2 27 27  GLUCOSE 166* 191*  BUN 9 13  CREATININE 0.34* 0.32*  CALCIUM 9.1 9.5    PT/INR: No results found for this basename: LABPROT, INR,  in the last 72 hours   Discharge Medications:     Medication List         CALCIUM 600 PO    Take by mouth.     dexamethasone 4 MG tablet  Commonly known as:  DECADRON  Take 2 tablets (8 mg total) by mouth daily. To stimulate appetite.     metFORMIN 1000 MG tablet  Commonly known as:  GLUCOPHAGE  Take 1,000 mg by mouth 2 (two) times daily with a meal.     MINIVELLE 0.0375 MG/24HR  Generic drug:  estradiol  Place 1 patch onto the skin 2 (two) times a week. Sundays and Thursdays     multivitamin tablet  Take 1 tablet by mouth daily. Senior     omeprazole 40 MG capsule  Commonly known as:  PRILOSEC  Take 1 capsule (40 mg total) by mouth daily.     ondansetron 8 MG disintegrating tablet  Commonly known as:  ZOFRAN ODT  Take 1 tablet (8 mg total) by mouth every 8 (eight) hours as needed for nausea.     oxyCODONE-acetaminophen 5-325 MG per tablet  Commonly known as:  PERCOCET/ROXICET  Take 1-2 tablets by mouth every 4 (four) hours as needed for moderate pain.     polyethylene glycol packet  Commonly known as:  MIRALAX / GLYCOLAX  Take 17 g by mouth daily. Taking in am and hs     sitaGLIPtin 100 MG tablet  Commonly known as:  JANUVIA  Take 100 mg by mouth daily.     SOLUBLE FIBER/PROBIOTICS PO  Take 1 tablet by mouth daily.         Discharge Instructions:  The patient is to refrain from driving, heavy lifting or strenuous activity.  May shower daily and clean incisions with soap and water.  May resume regular diet.   Follow Up:       Future Appointments Provider Department Dept Phone   03/06/2013 10:05 AM Oneita Hurt, MD [facility] RAD[facility]ATION ONCOLOGY [phone]   Joint Appt [facility] [place] [facility] [phone]   03/07/2013 11:30 AM Loreli Slot, MD [facility] [phone]   03/07/2013 2:35 PM [facility] [place] [facility] [phone]   03/08/2013 11:30 AM [facility] [place] [facility] RAD[facility]  ONCOLOGY [phone]   03/09/2013 2:35 PM [facility] [place] [facility] [phone]   03/10/2013 2:35 PM [facility] [place] [facility] [phone]   03/13/2013 2:35 PM [facility] [place] [facility] [phone]   03/14/2013 2:35 PM [facility] RAD[facility] [phone]   03/15/2013 2:35 PM [facility]  RADIATION ONCOLOGY [phone]   03/16/2013 2:35 PM [facility] [place] [facility] [phone]   03/17/2013 2:35 PM [facility] [place] [facility] [phone]   03/20/2013 11:10 AM [facility] [place] [facility] [phone]   03/21/2013 11:10 AM [facility] [place] [facility] [phone]   03/22/2013 11:10 AM [facility] [place] [facility] [phone]   03/24/2013 11:10 AM [facility] [place] [facility] [phone]   03/27/2013 11:10 AM [facility] [place] [facility] [phone]   03/28/2013 11:10 AM [facility] [place] [facility] [phone]   03/29/2013 11:10 AM [facility] [place] [facility] [phone]   03/31/2013 11:10 AM [facility] [place] [facility] [phone]   04/03/2013 11:10 AM [facility] [place] [facility] [phone]   04/04/2013 11:10 AM [facility] [place] [facility] RAD[facility] [phone]   04/05/2013 11:10 AM [facility] [place] [facility] [phone]   04/06/2013 11:10 AM [facility] [place] [facility] RAD[facility] [phone]   04/07/2013 11:10 AM  [facility] [place] [facility] RAD[facility] [phone]   04/10/2013 11:10 AM [facility] [place] [facility] [phone]   04/11/2013 11:10 AM [facility] [place] [facility] RAD[facility] [phone]      Follow-up Information   Follow up with Loreli Slot, MD.   Specialty:  Cardiothoracic Surgery   Contact information:   [address] [place] [address] [phone]       Follow up In 2 weeks. (Office will call to schedule appointment)       Follow up with [facility] NURSE In 1 week. (Office will schedule suture removal)       Follow up with Drue Second, MD. (Follow up as directed)    Specialty:  Oncology   Contact information:   [address] [place] [address] [phone]        Adella Hare 03/03/2013, 9:16 AM

## 2013-03-04 ENCOUNTER — Inpatient Hospital Stay (HOSPITAL_COMMUNITY): Payer: 59

## 2013-03-04 LAB — GLUCOSE, CAPILLARY
Glucose-Capillary: 131 mg/dL — ABNORMAL HIGH (ref 70–99)
Glucose-Capillary: 165 mg/dL — ABNORMAL HIGH (ref 70–99)

## 2013-03-04 MED ORDER — OXYCODONE-ACETAMINOPHEN 5-325 MG PO TABS: 1.0000 | ORAL_TABLET | ORAL | Status: DC | PRN

## 2013-03-04 MED ORDER — LEVALBUTEROL HCL 0.63 MG/3ML IN NEBU: 0.6300 mg | INHALATION_SOLUTION | Freq: Four times a day (QID) | RESPIRATORY_TRACT | Status: DC | PRN

## 2013-03-04 NOTE — Progress Notes (Addendum)
       [address]       [place] [address]             [phone]          5 Days Post-Op Procedure(s) (LRB): THOROCOTOMY WITH BI- LOBECTOMY (Right)  Subjective: More comfortable now that CT out.  No dyspnea with exertion. Eating better and walking in halls.   Objective: Vital signs in last 24 hours: Patient Vitals for the past 24 hrs:  BP Temp Temp src Pulse Resp SpO2  03/04/13 0300 128/63 mmHg 98.4 F (36.9 C) Oral 84 14 99 %  03/03/13 2300 126/64 mmHg 97.6 F (36.4 C) Oral 87 17 100 %  03/03/13 2023 126/66 mmHg 97.7 F (36.5 C) Oral 86 17 100 %  03/03/13 1527 140/70 mmHg 97.6 F (36.4 C) Oral 95 20 99 %  03/03/13 1053 115/44 mmHg - - 89 15 100 %  03/03/13 0825 142/73 mmHg - - 84 24 100 %  03/03/13 0800 - 98 F (36.7 C) Oral - - -   Current Weight  02/27/13 122 lb 5.7 oz (55.5 kg)     Intake/Output from previous day: 12/05 0701 - 12/06 0700 In: 480 [P.O.:480] Out: -   CBGs [phone]    PHYSICAL EXAM:  Heart: RRR Lungs: Clear bilaterally Wound: Clean and dry    Lab Results: CBC:No results found for this basename: WBC, HGB, HCT, PLT,  in the last 72 hours BMET:  Recent Labs  03/02/13 0430  NA 133*  K 4.0  CL 97  CO2 27  GLUCOSE 191*  BUN 13  CREATININE 0.32*  CALCIUM 9.5    PT/INR: No results found for this basename: LABPROT, INR,  in the last 72 hours  BJY:NWGNFAOZ:  Multiloculated hydro pneumothorax persists on the right. Slight  atelectasis adjacent to the right hilum has improved. Left lung is  clear. Central line and Port-A-Cath appear in good position. Heart  size and vascularity are normal.  IMPRESSION:  No significant change in the multiloculated right hydro  pneumothorax.    Assessment/Plan: S/P Procedure(s) (LRB): THOROCOTOMY WITH BI- LOBECTOMY (Right) CXR this am unchanged from yesterday after CT d/c'ed.  Pt stable, no complaints. MD to see.  Hopefully since she is stable, we will not have to  replace CT.  DM- sugars stable.  Continue current care.    LOS: 5 days    COLLINS,GINA H 03/04/2013  Patient seen and examined, agree with above  Home today

## 2013-03-04 NOTE — Progress Notes (Signed)
Patient d/c at 1400to home with daughter via wheelchair. Patient safe and verbalized understanding of discharge instructions

## 2013-03-06 ENCOUNTER — Ambulatory Visit
Admission: RE | Admit: 2013-03-06 | Discharge: 2013-03-06 | Disposition: A | Discharge status: Home / Self Care | Payer: 59 | Source: Ambulatory Visit | Attending: Radiation Oncology | Admitting: Radiation Oncology

## 2013-03-07 ENCOUNTER — Ambulatory Visit (HOSPITAL_BASED_OUTPATIENT_CLINIC_OR_DEPARTMENT_OTHER): Payer: 59 | Admitting: Oncology

## 2013-03-07 ENCOUNTER — Ambulatory Visit: Payer: 59 | Admitting: Thoracic Surgery (Cardiothoracic Vascular Surgery)

## 2013-03-07 ENCOUNTER — Ambulatory Visit
Admission: RE | Admit: 2013-03-07 | Discharge: 2013-03-07 | Disposition: A | Discharge status: Home / Self Care | Payer: 59 | Source: Ambulatory Visit | Attending: Radiation Oncology | Admitting: Radiation Oncology

## 2013-03-07 ENCOUNTER — Telehealth: Payer: Self-pay | Admitting: Oncology

## 2013-03-07 ENCOUNTER — Telehealth: Payer: Self-pay | Admitting: Radiation Oncology

## 2013-03-07 VITALS — BP 152/81 | HR 96 | Temp 97.9°F | Resp 18 | Ht 64.0 in | Wt 119.6 lb

## 2013-03-07 DIAGNOSIS — C7800 Secondary malignant neoplasm of unspecified lung: Secondary | ICD-10-CM

## 2013-03-07 DIAGNOSIS — C20 Malignant neoplasm of rectum: Secondary | ICD-10-CM

## 2013-03-07 DIAGNOSIS — D630 Anemia in neoplastic disease: Secondary | ICD-10-CM

## 2013-03-07 DIAGNOSIS — G8918 Other acute postprocedural pain: Secondary | ICD-10-CM

## 2013-03-07 DIAGNOSIS — D649 Anemia, unspecified: Secondary | ICD-10-CM

## 2013-03-07 MED ORDER — OXYCODONE-ACETAMINOPHEN 5-325 MG PO TABS: 1.0000 | ORAL_TABLET | ORAL | Status: DC | PRN

## 2013-03-07 MED ORDER — CAPECITABINE 500 MG PO TABS: 1000.0000 mg/m2 | ORAL_TABLET | Freq: Two times a day (BID) | ORAL | Status: DC

## 2013-03-07 NOTE — Telephone Encounter (Signed)
, °

## 2013-03-07 NOTE — Telephone Encounter (Signed)
Provided Ardeth Perfect with patient's REQUEST FOR REASONABLE ACCOMMODATION PACKET and a copy. Paperwork needs review by Dr. Kathrynn Running.

## 2013-03-08 ENCOUNTER — Ambulatory Visit (HOSPITAL_BASED_OUTPATIENT_CLINIC_OR_DEPARTMENT_OTHER): Payer: 59

## 2013-03-08 ENCOUNTER — Ambulatory Visit
Admission: RE | Admit: 2013-03-08 | Discharge: 2013-03-08 | Disposition: A | Discharge status: Home / Self Care | Payer: 59 | Source: Ambulatory Visit | Attending: Radiation Oncology | Admitting: Radiation Oncology

## 2013-03-08 ENCOUNTER — Telehealth: Payer: Self-pay | Admitting: Radiation Oncology

## 2013-03-08 ENCOUNTER — Encounter (HOSPITAL_COMMUNITY)
Admission: RE | Admit: 2013-03-08 | Discharge: 2013-03-08 | Disposition: A | Discharge status: Home / Self Care | Payer: 59 | Source: Ambulatory Visit | Attending: Oncology | Admitting: Oncology

## 2013-03-08 ENCOUNTER — Other Ambulatory Visit (HOSPITAL_BASED_OUTPATIENT_CLINIC_OR_DEPARTMENT_OTHER): Payer: 59

## 2013-03-08 VITALS — BP 142/85 | HR 79 | Temp 98.1°F | Resp 16

## 2013-03-08 DIAGNOSIS — D630 Anemia in neoplastic disease: Secondary | ICD-10-CM | POA: Insufficient documentation

## 2013-03-08 DIAGNOSIS — C7800 Secondary malignant neoplasm of unspecified lung: Secondary | ICD-10-CM | POA: Insufficient documentation

## 2013-03-08 DIAGNOSIS — C20 Malignant neoplasm of rectum: Secondary | ICD-10-CM | POA: Insufficient documentation

## 2013-03-08 LAB — COMPREHENSIVE METABOLIC PANEL (CC13)
ALT: 19 U/L (ref 0–55)
Alkaline Phosphatase: 85 U/L (ref 40–150)
Anion Gap: 13 mEq/L — ABNORMAL HIGH (ref 3–11)
BUN: 10.2 mg/dL (ref 7.0–26.0)
CO2: 22 mEq/L (ref 22–29)
Calcium: 9.8 mg/dL (ref 8.4–10.4)
Chloride: 98 mEq/L (ref 98–109)
Creatinine: 0.6 mg/dL (ref 0.6–1.1)
Sodium: 133 mEq/L — ABNORMAL LOW (ref 136–145)

## 2013-03-08 LAB — CBC WITH DIFFERENTIAL/PLATELET
BASO%: 1.1 % (ref 0.0–2.0)
Basophils Absolute: 0.1 10*3/uL (ref 0.0–0.1)
HCT: 27.1 % — ABNORMAL LOW (ref 34.8–46.6)
HGB: 9 g/dL — ABNORMAL LOW (ref 11.6–15.9)
MCHC: 33.3 g/dL (ref 31.5–36.0)
MONO#: 0.5 10*3/uL (ref 0.1–0.9)
NEUT#: 7 10*3/uL — ABNORMAL HIGH (ref 1.5–6.5)
NEUT%: 75.9 % (ref 38.4–76.8)
WBC: 9.2 10*3/uL (ref 3.9–10.3)
lymph#: 1.4 10*3/uL (ref 0.9–3.3)

## 2013-03-08 MED ORDER — SODIUM CHLORIDE 0.9 % IJ SOLN
10.0000 mL | INTRAMUSCULAR | Status: DC | PRN
  Administered 2013-03-08: 10 mL via INTRAVENOUS
  Filled 2013-03-08: qty 10

## 2013-03-08 MED ORDER — DIPHENHYDRAMINE HCL 25 MG PO CAPS
ORAL_CAPSULE | ORAL | Status: AC
  Filled 2013-03-08: qty 1

## 2013-03-08 MED ORDER — HEPARIN SOD (PORK) LOCK FLUSH 100 UNIT/ML IV SOLN
500.0000 [IU] | Freq: Once | INTRAVENOUS | Status: AC
  Administered 2013-03-08: 500 [IU] via INTRAVENOUS
  Filled 2013-03-08: qty 5

## 2013-03-08 MED ORDER — ACETAMINOPHEN 325 MG PO TABS
650.0000 mg | ORAL_TABLET | Freq: Once | ORAL | Status: AC
  Administered 2013-03-08: 650 mg via ORAL

## 2013-03-08 MED ORDER — DIPHENHYDRAMINE HCL 25 MG PO CAPS
25.0000 mg | ORAL_CAPSULE | Freq: Once | ORAL | Status: AC
Start: 2013-03-08 — End: 2013-03-08
  Administered 2013-03-08: 25 mg via ORAL

## 2013-03-08 MED ORDER — ACETAMINOPHEN 325 MG PO TABS
ORAL_TABLET | ORAL | Status: AC
  Filled 2013-03-08: qty 2

## 2013-03-08 NOTE — Telephone Encounter (Signed)
Returned copied of completed REQUEST FOR REASONABLE ACCOMMODATION paperwork to [employer]. Also, provided patient with completed paperwork and a copy. Patient reviewed paperwork and confirmed it was completed as desired. Patient expressed appreciation.

## 2013-03-08 NOTE — Patient Instructions (Signed)
Blood Transfusion  A blood transfusion replaces your blood or some of its parts. Blood is replaced when you have lost blood because of surgery, an accident, or for severe blood conditions like anemia. You can donate blood to be used on yourself if you have a planned surgery. If you lose blood during that surgery, your own blood can be given back to you. Any blood given to you is checked to make sure it matches your blood type. Your temperature, blood pressure, and heart rate (vital signs) will be checked often.  GET HELP RIGHT AWAY IF:   You feel sick to your stomach (nauseous) or throw up (vomit).  You have watery poop (diarrhea).  You have shortness of breath or trouble breathing.  You have blood in your pee (urine) or have dark colored pee.  You have chest pain or tightness.  Your eyes or skin turn yellow (jaundice).  You have a temperature by mouth above 102 F (38.9 C), not controlled by medicine.  You start to shake and have chills.  You develop a a red rash (hives) or feel itchy.  You develop lightheadedness or feel confused.  You develop back, joint, or muscle pain.  You do not feel hungry (lost appetite).  You feel tired, restless, or nervous.  You develop belly (abdominal) cramps. Document Released: 06/12/2008 Document Revised: 06/08/2011 Document Reviewed: 06/12/2008 Exit[employer] Patient Information 2014 [employer].  

## 2013-03-09 ENCOUNTER — Ambulatory Visit
Admission: RE | Admit: 2013-03-09 | Discharge: 2013-03-09 | Disposition: A | Discharge status: Home / Self Care | Payer: 59 | Source: Ambulatory Visit | Attending: Radiation Oncology | Admitting: Radiation Oncology

## 2013-03-09 ENCOUNTER — Other Ambulatory Visit: Payer: Self-pay | Admitting: *Deleted

## 2013-03-09 LAB — TYPE AND SCREEN
ABO/RH(D): O POS
Unit division: 0
Unit division: 0

## 2013-03-10 ENCOUNTER — Other Ambulatory Visit: Payer: Self-pay | Admitting: *Deleted

## 2013-03-10 ENCOUNTER — Encounter (INDEPENDENT_AMBULATORY_CARE_PROVIDER_SITE_OTHER): Payer: 59

## 2013-03-10 ENCOUNTER — Encounter: Payer: Self-pay | Admitting: Radiation Oncology

## 2013-03-10 ENCOUNTER — Ambulatory Visit
Admission: RE | Admit: 2013-03-10 | Discharge: 2013-03-10 | Disposition: A | Discharge status: Home / Self Care | Payer: 59 | Source: Ambulatory Visit | Attending: Radiation Oncology | Admitting: Radiation Oncology

## 2013-03-10 VITALS — BP 133/75 | HR 91 | Resp 16 | Wt 123.0 lb

## 2013-03-10 DIAGNOSIS — C3490 Malignant neoplasm of unspecified part of unspecified bronchus or lung: Secondary | ICD-10-CM

## 2013-03-10 DIAGNOSIS — R918 Other nonspecific abnormal finding of lung field: Secondary | ICD-10-CM

## 2013-03-10 DIAGNOSIS — C20 Malignant neoplasm of rectum: Secondary | ICD-10-CM

## 2013-03-10 NOTE — Progress Notes (Signed)
Received two units of PRBCs on 03/08/2013. Reports she picked up Xeloda yesterday but, had yet to start it. She plans to see Dr. Welton Flakes on Tuesday to discuss xeloda prior to starting. Reports decreased frequency of diarrhea. Reports passing on average 2-3 small flaky stools daily. Report bor bor ygmi. Denies frank blood in stool. Reports a scant amount of pink on tissue when wiping today. Denies dysuria or hematuria. Reports occasional nausea.

## 2013-03-10 NOTE — Progress Notes (Signed)
  Radiation Oncology         (336) [phone] ________________________________  Name: Anne [name] MRN: [id number]  Date: 03/10/2013  DOB: [birth year]  Weekly Radiation Therapy Management  Current Dose: 9 Gy     Planned Dose:  45 Gy  Narrative . . . . . . . . The patient presents for routine under treatment assessment.                                   The patient is without complaint.                                 Set-up films were reviewed.                                 The chart was checked. Physical Findings. . .  weight is 123 lb (55.792 kg). Her blood pressure is 133/75 and her pulse is 91. Her respiration is 16. . Weight essentially stable.  No significant changes. Impression . . . . . . . The patient is tolerating radiation. Plan . . . . . . . . . . . . Continue treatment as planned.  ________________________________  Artist Pais. Kathrynn Running, M.D.

## 2013-03-13 ENCOUNTER — Ambulatory Visit
Admission: RE | Admit: 2013-03-13 | Discharge: 2013-03-13 | Disposition: A | Discharge status: Home / Self Care | Payer: 59 | Source: Ambulatory Visit | Attending: Radiation Oncology | Admitting: Radiation Oncology

## 2013-03-14 ENCOUNTER — Telehealth: Payer: Self-pay | Admitting: Oncology

## 2013-03-14 ENCOUNTER — Ambulatory Visit (INDEPENDENT_AMBULATORY_CARE_PROVIDER_SITE_OTHER): Payer: 59 | Admitting: Thoracic Surgery (Cardiothoracic Vascular Surgery)

## 2013-03-14 ENCOUNTER — Ambulatory Visit
Admission: RE | Admit: 2013-03-14 | Discharge: 2013-03-14 | Disposition: A | Discharge status: Home / Self Care | Payer: 59 | Source: Ambulatory Visit | Attending: Thoracic Surgery (Cardiothoracic Vascular Surgery) | Admitting: Thoracic Surgery (Cardiothoracic Vascular Surgery)

## 2013-03-14 ENCOUNTER — Ambulatory Visit (HOSPITAL_BASED_OUTPATIENT_CLINIC_OR_DEPARTMENT_OTHER): Payer: 59 | Admitting: Oncology

## 2013-03-14 ENCOUNTER — Encounter: Payer: Self-pay | Admitting: Thoracic Surgery (Cardiothoracic Vascular Surgery)

## 2013-03-14 ENCOUNTER — Ambulatory Visit
Admission: RE | Admit: 2013-03-14 | Discharge: 2013-03-14 | Disposition: A | Discharge status: Home / Self Care | Payer: 59 | Source: Ambulatory Visit | Attending: Radiation Oncology | Admitting: Radiation Oncology

## 2013-03-14 VITALS — BP 150/84 | HR 87 | Temp 98.0°F | Resp 20 | Ht 64.0 in | Wt 121.4 lb

## 2013-03-14 VITALS — BP 130/76 | HR 90 | Resp 20 | Ht 64.0 in | Wt 123.0 lb

## 2013-03-14 DIAGNOSIS — C19 Malignant neoplasm of rectosigmoid junction: Secondary | ICD-10-CM

## 2013-03-14 DIAGNOSIS — C7800 Secondary malignant neoplasm of unspecified lung: Secondary | ICD-10-CM

## 2013-03-14 DIAGNOSIS — C3490 Malignant neoplasm of unspecified part of unspecified bronchus or lung: Secondary | ICD-10-CM

## 2013-03-14 DIAGNOSIS — Z09 Encounter for follow-up examination after completed treatment for conditions other than malignant neoplasm: Secondary | ICD-10-CM

## 2013-03-14 DIAGNOSIS — Z9889 Other specified postprocedural states: Secondary | ICD-10-CM

## 2013-03-14 DIAGNOSIS — Z902 Acquired absence of lung [part of]: Secondary | ICD-10-CM

## 2013-03-14 DIAGNOSIS — R918 Other nonspecific abnormal finding of lung field: Secondary | ICD-10-CM

## 2013-03-14 NOTE — Telephone Encounter (Signed)
gv and printed appt sched and avs for pt for Jan 2015 °

## 2013-03-14 NOTE — Progress Notes (Signed)
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 She had a right thoracotomy and bilobectomy with en bloc resection of a portion of the diaphragm and intercostal muscles for metastatic rectal cancer on December 1. Her postoperative course was uncomplicated.  She has been having incisional pain. She is taking Percocet as needed. She says sometimes she'll just take it once a day. On a couple of occasions she's had take it every 4 hours. She has started radiation to her pelvis. That is causing diarrhea. Despite that she has been able to gain some weight. She says that she is not having any problems with her breathing.   Past Medical History  Diagnosis Date  . Postmenopausal HRT (hormone replacement therapy) 07/01/2011    surgical menopause  . Hx of hyperglycemia 07/01/2011  . Hx of compression fracture of spine 1985    t12 after a  20 foot fall  . Hx of fracture of wrist     after fall   . 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 Hx of colonic polyps   . Early cataract     stoneburner   . High frequency hearing loss     kraus followed   . Diabetes mellitus without complication   . Complication of anesthesia     pt hadpanic attacks with versed, no versed  . Status post chemotherapy     completed a total of three cycles of Folfox   . Colon cancer     rectal cancer with lung metastasis  . Skin cancer     multiple  . Heart murmur, systolic 07/01/2011    innocent per Dr. Elijah Birk   . Asthma     as a child none since 49 years old  . [id number](478.2)       Current Outpatient Prescriptions  Medication Sig Dispense Refill  . Calcium Carbonate (CALCIUM 600 PO) Take by mouth.      . capecitabine (XELODA) 500 MG tablet Take 3 tablets (1,500 mg total) by mouth 2 (two) times daily after a meal.  180 tablet  5  . estradiol (MINIVELLE) 0.0375 MG/24HR Place 1 patch onto the skin 2 (two) times a week. Sundays and Thursdays      . metFORMIN (GLUCOPHAGE) 1000 MG tablet  Take 1,000 mg by mouth 2 (two) times daily with a meal.      . Multiple Vitamin (MULTIVITAMIN) tablet Take 1 tablet by mouth daily. Senior      . omeprazole (PRILOSEC) 40 MG capsule Take 1 capsule (40 mg total) by mouth daily.  30 capsule  6  . ondansetron (ZOFRAN ODT) 8 MG disintegrating tablet Take 1 tablet (8 mg total) by mouth every 8 (eight) hours as needed for nausea.  30 tablet  2  . oxyCODONE-acetaminophen (PERCOCET/ROXICET) 5-325 MG per tablet Take 1-2 tablets by mouth every 4 (four) hours as needed for moderate pain.  180 tablet  0  . Probiotic Product (SOLUBLE FIBER/PROBIOTICS PO) Take 1 tablet by mouth daily.      . sitaGLIPtin (JANUVIA) 100 MG tablet Take 100 mg by mouth daily.       No current facility-administered medications for this visit.    Physical Exam BP 130/76  Pulse 90  Resp 20  Ht 5\' 4"  (1.626 m)  Wt 123 lb (55.792 kg)  BMI 21.10 kg/m2  SpO57 65%  67 year old woman in no acute distress Neuro alert and oriented x3 with no focal deficits Lungs absent  breath sounds right base, otherwise clear Incisions healing well  Diagnostic Tests: Chest x-ray 03/14/2013  COMPARISON: 03/04/2013  FINDINGS:  Cardiomediastinal silhouette is stable. Left subclavian Port-A-Cath  is unchanged in position. Stable right lower multiloculated hydro  pneumothorax. Persistent atelectasis or scarring right perihilar. No  pulmonary edema. Left lung is clear.  IMPRESSION:  Left subclavian Port-A-Cath is unchanged in position. Stable right  lower multiloculated hydro pneumothorax. Persistent atelectasis or  scarring right perihilar. No pulmonary edema. Left lung is clear.   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 We had clear margins of resection. She is doing well from a surgical standpoint. It is not surprising that she still requiring some pain medication. I did give her an additional prescription for Percocet  5/325, one to 2 tablets 4 times daily as needed for pain, dispense 40, no refills.  Her physical activity is somewhat limited by fatigue and lack of stamina. She's not really getting short of breath per se. I reassured her that this should improve over time.  She should not lift any heavy objects.  Her chest x-ray does show a space at the right base. This should fill in with fluid over time.  Plan: I will plan to see her back in one month with a repeat chest x-ray to check on her progress

## 2013-03-15 ENCOUNTER — Ambulatory Visit
Admission: RE | Admit: 2013-03-15 | Discharge: 2013-03-15 | Disposition: A | Discharge status: Home / Self Care | Payer: 59 | Source: Ambulatory Visit | Attending: Radiation Oncology | Admitting: Radiation Oncology

## 2013-03-15 ENCOUNTER — Encounter: Payer: Self-pay | Admitting: Radiation Oncology

## 2013-03-15 VITALS — BP 133/66 | HR 98 | Temp 97.9°F | Resp 20 | Wt 122.6 lb

## 2013-03-15 DIAGNOSIS — C20 Malignant neoplasm of rectum: Secondary | ICD-10-CM

## 2013-03-15 NOTE — Progress Notes (Signed)
  Radiation Oncology         (336) [phone] ________________________________  Name: Anne [name] MRN: [id number]  Date: 03/15/2013  DOB: Jan 20, 1946  Weekly Radiation Therapy Management  Current Dose: 16 Gy     Planned Dose:  50 Gy  Narrative . . . . . . . . The patient presents for routine under treatment assessment.                                   The patient is having ongoing diarrhea.                                 Set-up films were reviewed.                                 The chart was checked. Physical Findings. . .  weight is 122 lb 9.6 oz (55.611 kg). Her oral temperature is 97.9 F (36.6 C). Her blood pressure is 133/66 and her pulse is 98. Her respiration is 20. . Weight essentially stable.  No significant changes. Impression . . . . . . . The patient is tolerating radiation. Plan . . . . . . . . . . . . Continue treatment as planned.  Recommended Sitz bath and A&D Topical.  ________________________________  Artist Pais. Kathrynn Running, M.D.

## 2013-03-15 NOTE — Progress Notes (Signed)
Rad txs 8 done rectal, Not taking miralax still having 6-8 semi stools formed,flaky, no nausa, fatigued, mild abd pain, appetite better, started xeloda last night 3:49 PM

## 2013-03-15 NOTE — Progress Notes (Signed)
OFFICE PROGRESS NOTE  CC  Anne [name] Harp, MD [address] [place] [address]  DIAGNOSIS: 67 year old [name] with new diagnosis of rectal cancer with lung metastasis   PRIOR THERAPY: #17/27/2014 with acute episode of bright red blood per rectum and large clot formation, prompting her to be evaluated at the ED. Of note,colonoscopy prior to this admission had been performed on 12/Anne [name]/11(screening) which showed three polyps ,largest 8 mm, removed, as well as moderate diverticulosis in the sigmoid colon, otherwise normal exam . On presentation, she had Positive Guaiac and a rectal mass had been palpated on exam. On 7/29 she underwent colonoscopy (Dr. Marina [name]) which revealed a bulky, circumferential, friable mass lesion in the rectum of about 4 cm above the dentate line, subtotally obstructing and would not permit passage of the colonoscope proximally. Multiple biopsies were taken.  #2 patient's pathology revealed consistent with adenocarcinoma with primary from rectal. Lung biopsy was performed also that revealed metastatic disease.  #3 patient was seen by Dr. Dorris [name] for possible resection of the lung metastasis. He recommended we proceed with chemotherapy first to see if we could do both lung metastases and with eventual resection. #4 patient is status post 5 cycles of FOLFOX Anne [name] administered from 11/22/2012 through November 2014.  #5 patient is now status post resection of lung mass on 02/27/2013 by Dr. Edwina [name]. Final pathology did reveal 1. Lung, resection (segmental or lobe), Right upper - ADENOCARCINOMA, CONSISTENT WITH METASTATIC COLORECTAL ADENOCARCINOMA, WELL TO MODERATELY DIFFERENTIATED, SPANNING 4.3 CM. - THE SURGICAL RESECTION MARGINS ARE NEGATIVE FOR ADENOCARCINOMA. - THERE IS NO EVIDENCE OF CARCINOMA IN 2 OF 2 LYMPH NODES (0/2). 2. Soft tissue, biopsy, Diaphragm margin #2 - THERE IS NO EVIDENCE OF MALIGNANCY. 3. Soft tissue, biopsy, Diaphragm #3 -  THERE IS NO EVIDENCE OF MALIGNANCY. 4. Lymph node, biopsy, Right 11 - THERE IS NO EVIDENCE OF CARCINOMA IN 1 OF 1 LYMPH NODE (0/1). - SEE COMMENT. 5. Lymph node, biopsy, Right 11 #2 - THERE IS NO EVIDENCE OF CARCINOMA IN 1 OF 1 LYMPH NODE (0/1). - SEE COMMENT. Anne [name]. Lymph node, biopsy, Right 11 #3 - THERE IS NO EVIDENCE OF CARCINOMA IN 1 OF 1 LYMPH NODE (0/1). - SEE COMMENT. 7. Lymph node, biopsy, Right 11 # 4 - THERE IS NO EVIDENCE OF CARCINOMA IN 1 OF 1 LYMPH NODE (0/1). - SEE COMMENT. 8. Lymph node, biopsy, Right #12 - THERE IS NO EVIDENCE OF CARCINOMA IN 1 OF 1 LYMPH NODE (0/1). - SEE COMMENT. 9. Lymph node, biopsy, Right #9 - THERE IS NO EVIDENCE OF CARCINOMA IN 1 OF 1 LYMPH NODE (0/1). - SEE COMMENT. #Anne [name] patient has been seen by Dr. Margaretmary [name] and has begun radiation therapy to the rectum. She will also need concurrent Xeloda.  CURRENT THERAPY:  Radiation therapy with concurrent radiosensitizing Xeloda  INTERVAL HISTORY: Anne [name] [name] 67 y.o. [name] for followup visit .clinically patient looks fine. She was discharged from the hospital after having her thoracotomy. She's been seeing by Dr. Kathrynn [name] for radiation therapy to the primary rectal tumor. So far she is tolerating it well. She is weak tired and fatigued from the surgery. She is trying to eat as much as she possibly can. She denies any headaches double vision fevers. She does get short of breath on exertion. She has not had any bleeding. Today we discussed role of Xeloda as a radiosensitizer. We discussed side effects of it as well. She understands that she will take this as long as  she is taking radiation. Her dose will be 1000 mg per meter squared twice a day. Risks benefits were discussed with her. Because patient is symptomatic she will also receive a blood transfusion. We will make arrangements for this as well. Remainder of the 10 point review of systems is negative.  MED ICAL HISTORY: Past Medical History   Diagnosis Date  . Postmenopausal HRT (hormone replacement therapy) 07/01/2011    surgical menopause  . Hx of hyperglycemia 07/01/2011  . Hx of compression fracture of spine 1985    Anne [name] after a  20 foot fall  . Hx of fracture of wrist     after fall   . History of renal stone Anne [name] 10    Anne [name] [name]  . Hx of colonic polyps   . Early cataract     Anne [name] [name]   . High frequency hearing loss     Anne [name] [name] followed   . Diabetes mellitus without complication   . Complication of anesthesia     pt hadpanic attacks with versed, no versed  . Status post chemotherapy     completed a total of three cycles of Folfox   . Colon cancer     rectal cancer with lung metastasis  . Skin cancer     multiple  . Heart murmur, systolic 07/01/2011    innocent per Dr. Elijah [name]   . Asthma     as a child none since 24 years old  . Headache(784.0)     ALLERGIES:  is allergic to ativan; midazolam; zolpidem tartrate; moviprep; zolpidem; and neomycin-bacitracin zn-polymyx.  MEDICATIONS:  Current Outpatient Prescriptions  Medication Sig Dispense Refill  . Calcium Carbonate (CALCIUM 600 PO) Take by mouth.      . estradiol (MINIVELLE) 0.0375 MG/24HR Place 1 patch onto the skin 2 (two) times a week. Sundays and Thursdays      . metFORMIN (GLUCOPHAGE) 1000 MG tablet Take 1,000 mg by mouth 2 (two) times daily with a meal.      . Multiple Vitamin (MULTIVITAMIN) tablet Take 1 tablet by mouth daily. Senior      . omeprazole (PRILOSEC) 40 MG capsule Take 1 capsule (40 mg total) by mouth daily.  30 capsule  Anne [name]  . oxyCODONE-acetaminophen (PERCOCET/ROXICET) 5-325 MG per tablet Take 1-2 tablets by mouth every 4 (four) hours as needed for moderate pain.  180 tablet  0  . Probiotic Product (SOLUBLE FIBER/PROBIOTICS PO) Take 1 tablet by mouth daily.      . sitaGLIPtin (JANUVIA) 100 MG tablet Take 100 mg by mouth daily.      . capecitabine (XELODA) 500 MG tablet Take 3 tablets (1,500 mg total) by mouth 2 (two) times daily after a meal.  180  tablet  5  . ondansetron (ZOFRAN ODT) 8 MG disintegrating tablet Take 1 tablet (8 mg total) by mouth every 8 (eight) hours as needed for nausea.  30 tablet  2   No current facility-administered medications for this visit.    SURGICAL HISTORY:  Past Surgical History  Procedure Laterality Date  . Tonsillectomy  1952  . Adenoidectomy  1952  . Wrist surgery  1957    left reduction  . Compression fraction  1985    Anne [name]  . Anterior cruciate ligament repair  1993    right Murphy  . Cosmetic surgery  1999-2007    holderness blepharoplasty facial rhytidectomy mastopexy abdominoplasty   . Dental implant    . Dilation and curettage of uterus  1977  . Total abdominal hysterectomy  2005    with cystocele repair   . Colonoscopy N/A 10/24/2012    Procedure: COLONOSCOPY;  Surgeon: Hilarie Fredrickson, MD;  Location: [facility] ENDOSCOPY;  Service: Endoscopy;  Laterality: N/A;  . Colonoscopy N/A 10/25/2012    Procedure: COLONOSCOPY;  Surgeon: Hilarie Fredrickson, MD;  Location: [facility] ENDOSCOPY;  Service: Endoscopy;  Laterality: N/A;  . Video bronchoscopy Bilateral 10/27/2012    Procedure: VIDEO BRONCHOSCOPY WITH FLUORO;  Surgeon: Coralyn Helling, MD;  Location: [facility] ENDOSCOPY;  Service: Endoscopy;  Laterality: Bilateral;  . Portacath placement Left 11/21/2012    Procedure: INSERTION PORT-A-CATH;  Surgeon: Loreli Slot, MD;  Location: [facility] OR;  Service: Thoracic;  Laterality: Left;  . Video assisted thoracoscopy Right 12/21/2012    Procedure: VIDEO ASSISTED THORACOSCOPY;  Surgeon: Loreli Slot, MD;  Location: [facility] OR;  Service: Thoracic;  Laterality: Right;  . Pleural effusion drainage Right 12/21/2012    Procedure: DRAINAGE OF PLEURAL EFFUSION;  Surgeon: Loreli Slot, MD;  Location: [facility] OR;  Service: Thoracic;  Laterality: Right;  . Decortication Right 12/21/2012    Procedure: DECORTICATION;  Surgeon: Loreli Slot, MD;  Location: [facility] OR;  Service: Thoracic;  Laterality: Right;  . Thorocotomy with lobectomy  Right 02/27/2013    Procedure: THOROCOTOMY WITH BI- LOBECTOMY;  Surgeon: Loreli Slot, MD;  Location: [facility] OR;  Service: Thoracic;  Laterality: Right;    REVIEW OF SYSTEMS:  Pertinent items are noted in HPI.   HEALTH MAINTENANCE:   PHYSICAL EXAMINATION: Blood pressure 152/81, pulse 96, temperature 97.9 F (36.Anne [name] C), temperature source Oral, resp. rate 18, height 5\' 4"  (1.626 m), weight 119 lb 9.Anne [name] oz (54.25 kg). Body mass index is 20.52 kg/(m^2). General: Patient is a well appearing [name] in no acute distress HEENT: PERRLA, sclerae anicteric no conjunctival pallor, MMM Neck: supple, no palpable adenopathy Lungs: clear to auscultation bilaterally, no wheezes, rhonchi, or rales Cardiovascular: regular rate rhythm, S1, S2, no murmurs, rubs or gallops Abdomen: Soft, non-tender, non-distended, normoactive bowel sounds, no HSM Extremities: warm and well perfused, no clubbing, cyanosis, or edema Skin: No rashes or lesions Neuro: Non-focal ECOG PERFORMANCE STATUS: 0 - Asymptomatic  LABORATORY DATA: Lab Results  Component Value Date   WBC 9.2 03/08/2013   HGB 9.0* 03/08/2013   HCT 27.1* 03/08/2013   MCV 86.9 03/08/2013   PLT 421* 03/08/2013      Chemistry      Component Value Date/Time   NA 133* 03/08/2013 1103   NA 133* 03/02/2013 0430   K 4.2 03/08/2013 1103   K 4.0 03/02/2013 0430   CL 97 03/02/2013 0430   CO2 22 03/08/2013 1103   CO2 27 03/02/2013 0430   BUN 10.2 03/08/2013 1103   BUN 13 03/02/2013 0430   CREATININE 0.Anne [name] 03/08/2013 1103   CREATININE 0.32* 03/02/2013 0430      Component Value Date/Time   CALCIUM 9.8 03/08/2013 1103   CALCIUM 9.5 03/02/2013 0430   ALKPHOS 85 03/08/2013 1103   ALKPHOS 42 03/01/2013 0500   AST 14 03/08/2013 1103   AST 17 03/01/2013 0500   ALT 19 03/08/2013 1103   ALT 33 03/01/2013 0500   BILITOT 0.27 03/08/2013 1103   BILITOT 0.3 03/01/2013 0500       RADIOGRAPHIC STUDIES:  Dg Chest 2 View Within Previous 72 Hours.  Films Obtained On  Friday Are Acceptable For Monday And Tuesday Cases  11/18/2012   CLINICAL DATA:  67 year old [name] preoperative study. Lung mass. Colon cancer.  EXAM: CHEST  2  VIEW  COMPARISON:  PET-CT 11/02/2012 and earlier.  FINDINGS: 5 cm right lower lobe lung mass re- identified. Radiographically this appears not significantly changed since 10/25/2012. The Anne [name] mm cavitary right upper lobe pulmonary nodule is faint but visible. Stable lung volumes. Stable cardiac size and mediastinal contours. The left lung is clear. No pneumothorax or effusion. Stable visualized osseous structures. Chronic Anne [name] compression fracture. Flowing thoracic osteophytes.  IMPRESSION: Known right lung lesions. No new cardiopulmonary abnormality.   Electronically Signed   By: Augusto Gamble   On: 11/18/2012 15:53   Nm Pet Image Initial (pi) Skull Base To Thigh  8/Anne [name]/2014   *RADIOLOGY REPORT*  Clinical Data:  Initial treatment strategy for rectal cancer. Circumferential rectal cancer 4 cm from the anal verge.  4 cm right lung mass.  NUCLEAR MEDICINE PET WHOLE BODY  Fasting Blood Glucose:  123  Technique:  19.1 mCi F-18 FDG was injected intravenously. CT data was obtained and used for attenuation correction and anatomic localization only.  (This was not acquired as a diagnostic CT examination.) Additional exam technical data entered on technologist worksheet.  Comparison:  CT scan from 10/25/2012  Findings:  Head/Neck:   No hypermetabolic lymph nodes in the neck.  Chest:  The previously characterized 5 cm right lower lobe pulmonary mass is hypermetabolic with SUV max = 8.  No evidence for hypermetabolic lymphadenopathy in the mediastinum or either hilum. No other hypermetabolic lung lesions are evident although the patient does have bilateral small pulmonary parenchymal nodules (see posterior right upper lobe on image 73 and posterior right lower lobe on image 78).  Both of these small nodules are below the accepted size threshold for reliable resolution on  PET imaging.  Abdomen/Pelvis:  No abnormal hypermetabolism is identified within the liver.  No hypermetabolic lymphadenopathy in the abdomen.  There is a prominent amount of physiologic F D G uptake in the colon, but this is associated with hypermetabolic uptake in a region of circumferential rectal wall thickening.  SUV max = 9 for the rectal hypermetabolism.  Skeleton:  No focal hypermetabolic activity to suggest skeletal metastasis.  IMPRESSION: Hypermetabolism in the area of apparent circumferential rectal wall thickening associate with hypermetabolic 5 cm right lower lobe pulmonary mass.  Anne [name] mm cavitary pulmonary nodule the posterior right upper lobe is not hypermetabolic on PET imaging, but is below the accepted size threshold for reliable resolution on PET.  A second tiny right lower lobe nodule is also noted.   Original Report Authenticated By: Kennith Center, M.D.   Dg Fluoro Guide Cv Line-no Report  11/21/2012   CLINICAL DATA: Done in OR # 10,Intraoperative insertion port a cath   FLOURO GUIDE CV LINE  Fluoroscopy was utilized by the requesting physician.  No radiographic  interpretation.     ASSESSMENT: 67 year old [name] with  #1 metastatic rectal carcinoma with metastasis to the lung. She is in oligo metastases patient is very interested in being aggressive with her disease.She will receive a total of Anne [name] cycles of FOLFOX Anne [name]. This will then be followed by radiation with radiosensitizing Xeloda. She will then finish up another Anne [name] cycles of FOLFOX Anne [name].  #2 patient's course was complicated after 2 cycles with hospitalization for pneumonia/loculated pleural effusion she did require VATs procedure with drainage of the pleural effusion. Her cultures did reveal strep. She has been on antibiotics. She is completed up and 10 now. She seems to be doing much better.  #3 patient is status post cycle 4 of FOLFOX. Overall she  tolerated it well.  #4 patient was seen by Dr. Renold Genta at [facility]. He has  recommended the possibility of doing the colon surgery first. We will try to coordinate this. Certainly we could after the fifth cycle of chemotherapy she is going to have a proctoscope performed at [facility]. After that she certainly could begin radiation therapy with radiosensitizing Xeloda. When she completes this she will proceed with her surgery. Once she recovers from it we certainly could get her started back on radiation therapy  #5 patient is status post thoracotomy for resection of metastatic deposit in the lung. Final pathology as noted above.  #Anne [name] patient with anemia that is symptomatic. We will transfuse her with packed red cells 2 units.  #7 patient will be receiving radiation therapy to the rectal mass we will add Xeloda as a radiosensitizer. We discussed rationale for this. We discussed side effects.  PLAN:  #1 continue radiation therapy with addition of Xeloda at 1000 mg per meter squared twice a day twice a day. Risks benefits and side effects were discussed.  #2 2 units of red cell transfusion tomorrow  #3 patient will return in one week's time for followup.  #4 we also discussed exercise eating healthy maintaining a good weight.  All questions were answered. The patient knows to call the clinic with any problems, questions or concerns. We can certainly see the patient much sooner if necessary.  I spent 25 minutes counseling the patient face to face. The total time spent in the appointment was 30 minutes.  Drue Second, MD Medical/Oncology [facility] [phone] (beeper) [phone] (Office)

## 2013-03-16 ENCOUNTER — Ambulatory Visit
Admission: RE | Admit: 2013-03-16 | Discharge: 2013-03-16 | Disposition: A | Discharge status: Home / Self Care | Payer: 59 | Source: Ambulatory Visit | Attending: Radiation Oncology | Admitting: Radiation Oncology

## 2013-03-17 ENCOUNTER — Ambulatory Visit
Admission: RE | Admit: 2013-03-17 | Discharge: 2013-03-17 | Disposition: A | Discharge status: Home / Self Care | Payer: 59 | Source: Ambulatory Visit | Attending: Radiation Oncology | Admitting: Radiation Oncology

## 2013-03-17 NOTE — Progress Notes (Signed)
OFFICE PROGRESS NOTE  CC  [name], MD [address] [place] [address]  DIAGNOSIS: 67 year old female with new diagnosis of rectal cancer with lung metastasis   PRIOR THERAPY: #17/27/2014 with acute episode of bright red blood per rectum and large clot formation, prompting her to be evaluated at the ED. Of note,colonoscopy prior to this admission had been performed on 03/04/10(screening) which showed three polyps ,largest 8 mm, removed, as well as moderate diverticulosis in the sigmoid colon, otherwise normal exam . On presentation, she had Positive Guaiac and a rectal mass had been palpated on exam. On 7/29 she underwent colonoscopy (Dr. Marina Goodell) which revealed a bulky, circumferential, friable mass lesion in the rectum of about 4 cm above the dentate line, subtotally obstructing and would not permit passage of the colonoscope proximally. Multiple biopsies were taken.  #2 patient's pathology revealed consistent with adenocarcinoma with primary from rectal. Lung biopsy was performed also that revealed metastatic disease.  #3 patient was seen by Dr. Dorris Fetch for possible resection of the lung metastasis. He recommended we proceed with chemotherapy first to see if we could do both lung metastases and with eventual resection. #4 patient is status post 5 cycles of FOLFOX 6 administered from 11/22/2012 through November 2014.  #5 patient is now status post resection of lung mass on 02/27/2013 by Dr. Edwina Barth. Final pathology did reveal 1. Lung, resection (segmental or lobe), Right upper - ADENOCARCINOMA, CONSISTENT WITH METASTATIC COLORECTAL ADENOCARCINOMA, WELL TO MODERATELY DIFFERENTIATED, SPANNING 4.3 CM. - THE SURGICAL RESECTION MARGINS ARE NEGATIVE FOR ADENOCARCINOMA. - THERE IS NO EVIDENCE OF CARCINOMA IN 2 OF 2 LYMPH NODES (0/2). 2. Soft tissue, biopsy, Diaphragm margin #2 - THERE IS NO EVIDENCE OF MALIGNANCY. 3. Soft tissue, biopsy, Diaphragm #3 -  THERE IS NO EVIDENCE OF MALIGNANCY. 4. Lymph node, biopsy, Right 11 - THERE IS NO EVIDENCE OF CARCINOMA IN 1 OF 1 LYMPH NODE (0/1). - SEE COMMENT. 5. Lymph node, biopsy, Right 11 #2 - THERE IS NO EVIDENCE OF CARCINOMA IN 1 OF 1 LYMPH NODE (0/1). - SEE COMMENT. 6. Lymph node, biopsy, Right 11 #3 - THERE IS NO EVIDENCE OF CARCINOMA IN 1 OF 1 LYMPH NODE (0/1). - SEE COMMENT. 7. Lymph node, biopsy, Right 11 # 4 - THERE IS NO EVIDENCE OF CARCINOMA IN 1 OF 1 LYMPH NODE (0/1). - SEE COMMENT. 8. Lymph node, biopsy, Right #12 - THERE IS NO EVIDENCE OF CARCINOMA IN 1 OF 1 LYMPH NODE (0/1). - SEE COMMENT. 9. Lymph node, biopsy, Right #9 - THERE IS NO EVIDENCE OF CARCINOMA IN 1 OF 1 LYMPH NODE (0/1). - SEE COMMENT. #6 patient has been seen by Dr. Margaretmary Dys and has begun radiation therapy to the rectum. She will also need concurrent Xeloda.  CURRENT THERAPY:  Radiation therapy with concurrent radiosensitizing Xeloda  INTERVAL HISTORY: DALESHA STANBACK 67 y.o. female returns for followup visit .clinically patient looks fine. Today we discussed role of Xeloda as a radiosensitizer. We discussed side effects of it as well. She understands that she will take this as long as she is taking radiation. Her dose will be 1000 mg per meter squared twice a day. Risks benefits were discussed with her. Remainder of the 10 point review of systems is negative.  MED ICAL HISTORY: Past Medical History  Diagnosis Date  . Postmenopausal HRT (hormone replacement therapy) 07/01/2011    surgical menopause  . Hx of hyperglycemia 07/01/2011  . Hx of compression fracture of spine 1985  t12 after a  20 foot fall  . Hx of fracture of wrist     after fall   . 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 Hx of colonic polyps   . Early cataract     stoneburner   . High frequency hearing loss     [facility] followed   . Diabetes mellitus without complication   . Complication of anesthesia     pt hadpanic attacks with  versed, no versed  . Status post chemotherapy     completed a total of three cycles of Folfox   . Colon cancer     rectal cancer with lung metastasis  . Skin cancer     multiple  . Heart murmur, systolic 07/01/2011    innocent per Dr. Elijah Birk   . Asthma     as a child none since 28 years old  . Headache(784.0)     ALLERGIES:  is allergic to ativan; midazolam; zolpidem tartrate; moviprep; zolpidem; and neomycin-bacitracin zn-polymyx.  MEDICATIONS:  Current Outpatient Prescriptions  Medication Sig Dispense Refill  . Calcium Carbonate (CALCIUM 600 PO) Take by mouth.      . capecitabine (XELODA) 500 MG tablet Take 3 tablets (1,500 mg total) by mouth 2 (two) times daily after a meal.  180 tablet  5  . estradiol (MINIVELLE) 0.0375 MG/24HR Place 1 patch onto the skin 2 (two) times a week. Sundays and Thursdays      . metFORMIN (GLUCOPHAGE) 1000 MG tablet Take 1,000 mg by mouth 2 (two) times daily with a meal.      . Multiple Vitamin (MULTIVITAMIN) tablet Take 1 tablet by mouth daily. Senior      . omeprazole (PRILOSEC) 40 MG capsule Take 1 capsule (40 mg total) by mouth daily.  30 capsule  6  . ondansetron (ZOFRAN ODT) 8 MG disintegrating tablet Take 1 tablet (8 mg total) by mouth every 8 (eight) hours as needed for nausea.  30 tablet  2  . oxyCODONE-acetaminophen (PERCOCET/ROXICET) 5-325 MG per tablet Take 1-2 tablets by mouth every 4 (four) hours as needed for moderate pain.  180 tablet  0  . Probiotic Product (SOLUBLE FIBER/PROBIOTICS PO) Take 1 tablet by mouth daily.      . sitaGLIPtin (JANUVIA) 100 MG tablet Take 100 mg by mouth daily.       No current facility-administered medications for this visit.    SURGICAL HISTORY:  Past Surgical History  Procedure Laterality Date  . Tonsillectomy  1952  . Adenoidectomy  1952  . Wrist surgery  1957    left reduction  . Compression fraction  1985    T12  . Anterior cruciate ligament repair  1993    right Murphy  . Cosmetic surgery  1999-2007     holderness blepharoplasty facial rhytidectomy mastopexy abdominoplasty   . Dental implant    . Dilation and curettage of uterus  1977  . Total abdominal hysterectomy  2005    with cystocele repair   . Colonoscopy N/A 10/24/2012    Procedure: COLONOSCOPY;  Surgeon: Hilarie Fredrickson, MD;  Location: [facility] ENDOSCOPY;  Service: Endoscopy;  Laterality: N/A;  . Colonoscopy N/A 10/25/2012    Procedure: COLONOSCOPY;  Surgeon: Hilarie Fredrickson, MD;  Location: [facility] ENDOSCOPY;  Service: Endoscopy;  Laterality: N/A;  . Video bronchoscopy Bilateral 10/27/2012    Procedure: VIDEO BRONCHOSCOPY WITH FLUORO;  Surgeon: Coralyn Helling, MD;  Location: [facility] ENDOSCOPY;  Service: Endoscopy;  Laterality: Bilateral;  .  Portacath placement Left 11/21/2012    Procedure: INSERTION PORT-A-CATH;  Surgeon: Loreli Slot, MD;  Location: [facility] OR;  Service: Thoracic;  Laterality: Left;  . Video assisted thoracoscopy Right 12/21/2012    Procedure: VIDEO ASSISTED THORACOSCOPY;  Surgeon: Loreli Slot, MD;  Location: [facility] OR;  Service: Thoracic;  Laterality: Right;  . Pleural effusion drainage Right 12/21/2012    Procedure: DRAINAGE OF PLEURAL EFFUSION;  Surgeon: Loreli Slot, MD;  Location: [facility] OR;  Service: Thoracic;  Laterality: Right;  . Decortication Right 12/21/2012    Procedure: DECORTICATION;  Surgeon: Loreli Slot, MD;  Location: [facility] OR;  Service: Thoracic;  Laterality: Right;  . Thorocotomy with lobectomy Right 02/27/2013    Procedure: THOROCOTOMY WITH BI- LOBECTOMY;  Surgeon: Loreli Slot, MD;  Location: [facility] OR;  Service: Thoracic;  Laterality: Right;    REVIEW OF SYSTEMS:  Pertinent items are noted in HPI.   HEALTH MAINTENANCE:   PHYSICAL EXAMINATION: Blood pressure 150/84, pulse 87, temperature 98 F (36.7 C), temperature source Oral, resp. rate 20, height 5\' 4"  (1.626 m), weight 121 lb 6.4 oz (55.067 kg). Body mass index is 20.83 kg/(m^2). General: Patient is a well appearing female in no acute  distress HEENT: PERRLA, sclerae anicteric no conjunctival pallor, MMM Neck: supple, no palpable adenopathy Lungs: clear to auscultation bilaterally, no wheezes, rhonchi, or rales Cardiovascular: regular rate rhythm, S1, S2, no murmurs, rubs or gallops Abdomen: Soft, non-tender, non-distended, normoactive bowel sounds, no HSM Extremities: warm and well perfused, no clubbing, cyanosis, or edema Skin: No rashes or lesions Neuro: Non-focal ECOG PERFORMANCE STATUS: 0 - Asymptomatic  LABORATORY DATA: Lab Results  Component Value Date   WBC 9.2 03/08/2013   HGB 9.0* 03/08/2013   HCT 27.1* 03/08/2013   MCV 86.9 03/08/2013   PLT 421* 03/08/2013      Chemistry      Component Value Date/Time   NA 133* 03/08/2013 1103   NA 133* 03/02/2013 0430   K 4.2 03/08/2013 1103   K 4.0 03/02/2013 0430   CL 97 03/02/2013 0430   CO2 22 03/08/2013 1103   CO2 27 03/02/2013 0430   BUN 10.2 03/08/2013 1103   BUN 13 03/02/2013 0430   CREATININE 0.6 03/08/2013 1103   CREATININE 0.32* 03/02/2013 0430      Component Value Date/Time   CALCIUM 9.8 03/08/2013 1103   CALCIUM 9.5 03/02/2013 0430   ALKPHOS 85 03/08/2013 1103   ALKPHOS 42 03/01/2013 0500   AST 14 03/08/2013 1103   AST 17 03/01/2013 0500   ALT 19 03/08/2013 1103   ALT 33 03/01/2013 0500   BILITOT 0.27 03/08/2013 1103   BILITOT 0.3 03/01/2013 0500       RADIOGRAPHIC STUDIES:  Dg Chest 2 View Within Previous 72 Hours.  Films Obtained On Friday Are Acceptable For Monday And Tuesday Cases  11/18/2012   CLINICAL DATA:  67 year old female preoperative study. Lung mass. Colon cancer.  EXAM: CHEST  2 VIEW  COMPARISON:  PET-CT 11/02/2012 and earlier.  FINDINGS: 5 cm right lower lobe lung mass re- identified. Radiographically this appears not significantly changed since 10/25/2012. The 6 mm cavitary right upper lobe pulmonary nodule is faint but visible. Stable lung volumes. Stable cardiac size and mediastinal contours. The left lung is clear. No  pneumothorax or effusion. Stable visualized osseous structures. Chronic T12 compression fracture. Flowing thoracic osteophytes.  IMPRESSION: Known right lung lesions. No new cardiopulmonary abnormality.   Electronically Signed   By: Augusto Gamble  On: 11/18/2012 15:53   Nm Pet Image Initial (pi) Skull Base To Thigh  11/02/2012   *RADIOLOGY REPORT*  Clinical Data:  Initial treatment strategy for rectal cancer. Circumferential rectal cancer 4 cm from the anal verge.  4 cm right lung mass.  NUCLEAR MEDICINE PET WHOLE BODY  Fasting Blood Glucose:  123  Technique:  19.1 mCi F-18 FDG was injected intravenously. CT data was obtained and used for attenuation correction and anatomic localization only.  (This was not acquired as a diagnostic CT examination.) Additional exam technical data entered on technologist worksheet.  Comparison:  CT scan from 10/25/2012  Findings:  Head/Neck:   No hypermetabolic lymph nodes in the neck.  Chest:  The previously characterized 5 cm right lower lobe pulmonary mass is hypermetabolic with SUV max = 8.  No evidence for hypermetabolic lymphadenopathy in the mediastinum or either hilum. No other hypermetabolic lung lesions are evident although the patient does have bilateral small pulmonary parenchymal nodules (see posterior right upper lobe on image 73 and posterior right lower lobe on image 78).  Both of these small nodules are below the accepted size threshold for reliable resolution on PET imaging.  Abdomen/Pelvis:  No abnormal hypermetabolism is identified within the liver.  No hypermetabolic lymphadenopathy in the abdomen.  There is a prominent amount of physiologic F D G uptake in the colon, but this is associated with hypermetabolic uptake in a region of circumferential rectal wall thickening.  SUV max = 9 for the rectal hypermetabolism.  Skeleton:  No focal hypermetabolic activity to suggest skeletal metastasis.  IMPRESSION: Hypermetabolism in the area of apparent circumferential rectal  wall thickening associate with hypermetabolic 5 cm right lower lobe pulmonary mass.  6 mm cavitary pulmonary nodule the posterior right upper lobe is not hypermetabolic on PET imaging, but is below the accepted size threshold for reliable resolution on PET.  A second tiny right lower lobe nodule is also noted.   Original Report Authenticated By: Kennith Center, M.D.   Dg Fluoro Guide Cv Line-no Report  11/21/2012   CLINICAL DATA: Done in OR # 10,Intraoperative insertion port a cath   FLOURO GUIDE CV LINE  Fluoroscopy was utilized by the requesting physician.  No radiographic  interpretation.     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 She is in oligo metastases patient is very interested in being aggressive with her disease.She will receive a total of 6 cycles of FOLFOX 6. This will then be followed by radiation with radiosensitizing Xeloda. She will then finish up another 6 cycles of FOLFOX 6.  #2 patient's course was complicated after 2 cycles with hospitalization for pneumonia/loculated pleural effusion she did require VATs procedure with drainage of the pleural effusion. Her cultures did reveal strep. She has been on antibiotics. She is completed up and 10 now. She seems to be doing much better.  #3 patient is status post cycle 4 of FOLFOX. Overall she tolerated it well.  #4 patient was seen by Dr. Renold Genta at [facility]. He has recommended the possibility of doing the colon surgery first. We will try to coordinate this. Certainly we could after the fifth cycle of chemotherapy she is going to have a proctoscope performed at [facility]. After that she certainly could begin radiation therapy with radiosensitizing Xeloda. When she completes this she will proceed with her surgery. Once she recovers from it we certainly could get her started back on radiation therapy  #5 patient  is status post thoracotomy for resection of metastatic  deposit in the lung. Final pathology as noted above.  #6 patient with anemia that is symptomatic. We will transfuse her with packed red cells 2 units.  #7 patient will be receiving radiation therapy to the rectal mass we will add Xeloda as a radiosensitizer. We discussed rationale for this. We discussed side effects.  PLAN:  #1 continue radiation therapy with addition of Xeloda at 1000 mg per meter squared twice a day twice a day. Risks benefits and side effects were discussed.  #2 patient will return in 2 week's time for followup.  #3 we also discussed exercise eating healthy maintaining a good weight.  All questions were answered. The patient knows to call the clinic with any problems, questions or concerns. We can certainly see the patient much sooner if necessary.  I spent 25 minutes counseling the patient face to face. The total time spent in the appointment was 30 minutes.  Drue Second, MD Medical/Oncology [facility] [phone] (beeper) [phone] (Office)

## 2013-03-20 ENCOUNTER — Ambulatory Visit
Admission: RE | Admit: 2013-03-20 | Discharge: 2013-03-20 | Disposition: A | Discharge status: Home / Self Care | Payer: 59 | Source: Ambulatory Visit | Attending: Radiation Oncology | Admitting: Radiation Oncology

## 2013-03-21 ENCOUNTER — Encounter: Payer: Self-pay | Admitting: Internal Medicine

## 2013-03-21 ENCOUNTER — Ambulatory Visit (INDEPENDENT_AMBULATORY_CARE_PROVIDER_SITE_OTHER): Payer: 59 | Admitting: Internal Medicine

## 2013-03-21 ENCOUNTER — Ambulatory Visit
Admission: RE | Admit: 2013-03-21 | Discharge: 2013-03-21 | Disposition: A | Discharge status: Home / Self Care | Payer: 59 | Source: Ambulatory Visit | Attending: Radiation Oncology | Admitting: Radiation Oncology

## 2013-03-21 VITALS — BP 146/90 | HR 97 | Temp 97.5°F | Wt 117.0 lb

## 2013-03-21 DIAGNOSIS — T887XXA Unspecified adverse effect of drug or medicament, initial encounter: Secondary | ICD-10-CM

## 2013-03-21 DIAGNOSIS — D649 Anemia, unspecified: Secondary | ICD-10-CM

## 2013-03-21 DIAGNOSIS — M79622 Pain in left upper arm: Secondary | ICD-10-CM

## 2013-03-21 DIAGNOSIS — R0602 Shortness of breath: Secondary | ICD-10-CM | POA: Insufficient documentation

## 2013-03-21 DIAGNOSIS — T50905A Adverse effect of unspecified drugs, medicaments and biological substances, initial encounter: Secondary | ICD-10-CM

## 2013-03-21 DIAGNOSIS — C7800 Secondary malignant neoplasm of unspecified lung: Secondary | ICD-10-CM

## 2013-03-21 DIAGNOSIS — C20 Malignant neoplasm of rectum: Secondary | ICD-10-CM

## 2013-03-21 DIAGNOSIS — M79609 Pain in unspecified limb: Secondary | ICD-10-CM

## 2013-03-21 LAB — CBC WITH DIFFERENTIAL/PLATELET
Basophils Absolute: 0 10*3/uL (ref 0.0–0.1)
Basophils Relative: 0.3 % (ref 0.0–3.0)
Eosinophils Relative: 7.9 % — ABNORMAL HIGH (ref 0.0–5.0)
HCT: 36.6 % (ref 36.0–46.0)
Hemoglobin: 12.1 g/dL (ref 12.0–15.0)
Lymphs Abs: 0.9 10*3/uL (ref 0.7–4.0)
Monocytes Relative: 9.7 % (ref 3.0–12.0)
Neutro Abs: 4.1 10*3/uL (ref 1.4–7.7)
RBC: 4.18 Mil/uL (ref 3.87–5.11)
RDW: 19.7 % — ABNORMAL HIGH (ref 11.5–14.6)
WBC: 6.1 10*3/uL (ref 4.5–10.5)

## 2013-03-21 LAB — COMPREHENSIVE METABOLIC PANEL
ALT: 16 U/L (ref 0–35)
Alkaline Phosphatase: 68 U/L (ref 39–117)
BUN: 10 mg/dL (ref 6–23)
CO2: 31 mEq/L (ref 19–32)
Calcium: 9.4 mg/dL (ref 8.4–10.5)
Creatinine, Ser: 0.3 mg/dL — ABNORMAL LOW (ref 0.4–1.2)
GFR: 210.01 mL/min (ref >60.00)
Glucose, Bld: 163 mg/dL — ABNORMAL HIGH (ref 70–99)
Total Bilirubin: 0.5 mg/dL (ref 0.3–1.2)

## 2013-03-21 NOTE — Progress Notes (Signed)
Chief Complaint  Patient presents with  . Arm Pain    Check for anemia in active chemoradiation therapy    HPI: Patient comes in today for SDA for  new problem evaluation. See phone note  Update status of disease states   Under active rx for metastatic colon cancer and under went thoracotomy early December.1st   Took out more  Surrounding area and all removed. No nodes   Is in the middle of radiation treatment  Diarrhea from  Treatment  On xeloda  And causing explosive hard to control diarrhea .  When taking 3 per day. Stopped the xeloda  temporarily  Cancer doc` Dr Welton Flakes  [place] of [place] . Until the end of the week.     Corneal ulcer right   Saw    stponeburner has ulcer on eye.  Adding bacitracin. But allergic   New med.  zymaxid   erythro mycin seen today.  New fyi left arm tender to use in one spot lump area .  No injury but lays on left side a lot cause of recovering from thoracotomy. No tear  Bruise   Just wanted to get checked . ROS: See pertinent positives and negatives per HPI. A bit sob that she tends to get when anemic   Would like recheck  No bleeding active at this time   Blood sugars not checking at this time   hasn't been told over 200 range  When under rx   Past Medical History  Diagnosis Date  . Postmenopausal HRT (hormone replacement therapy) 07/01/2011    surgical menopause  . Hx of hyperglycemia 07/01/2011  . Hx of compression fracture of spine 1985    t12 after a  20 foot fall  . Hx of fracture of wrist     after fall   . 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 Hx of colonic polyps   . Early cataract     stoneburner   . High frequency hearing loss     [facility] followed   . Diabetes mellitus without complication   . Complication of anesthesia     pt hadpanic attacks with versed, no versed  . Status post chemotherapy     completed a total of three cycles of Folfox   . Colon cancer     rectal cancer with lung metastasis  . Skin cancer     multiple  .  Heart murmur, systolic 07/01/2011    innocent per Dr. Elijah Birk   . Asthma     as a child none since 89 years old  . Headache(784.0)     Family History  Problem Relation Age of Onset  . Diabetes Father   . Cancer Father     oral/squamous cell  . Thyroid disease Father   . Heart attack Father     died age 65  . Dementia Mother   . Hypertension Mother   . Hyperlipidemia Mother   . Obesity Mother   . Cancer Brother     retro lymphosarcoma agent orange  . Cancer Sister     clear cell ovarina and breast insitu  . Diabetes Brother   . Obesity Brother   . Hypertension Sister   . Asthma Sister     History   Social History  . Marital Status: Widowed    Spouse Name: N/A    Number of Children: 2  . Years of Education: N/A   Occupational History  .  PHYSICIAN    Social History Main Topics  . Smoking status: Never Smoker   . Smokeless tobacco: Never Used  . Alcohol Use: 1.0 oz/week    2 drink(s) per week     Comment: occ  . Drug Use: No  . Sexual Activity: No   Other Topics Concern  . None   Social History Narrative   Divorced  2006 after 70 years marriage    MD developmental Dance movement psychotherapist    Social etoh   NON smoker   exercise 4 x per week  Has personal trainer   G4P2    Outpatient Encounter Prescriptions as of 03/21/2013  Medication Sig  . Calcium Carbonate (CALCIUM 600 PO) Take by mouth.  . estradiol (MINIVELLE) 0.0375 MG/24HR Place 1 patch onto the skin 2 (two) times a week. Sundays and Thursdays  . metFORMIN (GLUCOPHAGE) 1000 MG tablet Take 1,000 mg by mouth 2 (two) times daily with a meal.  . Multiple Vitamin (MULTIVITAMIN) tablet Take 1 tablet by mouth daily. Senior  . omeprazole (PRILOSEC) 40 MG capsule Take 1 capsule (40 mg total) by mouth daily.  Marland Kitchen oxyCODONE-acetaminophen (PERCOCET/ROXICET) 5-325 MG per tablet Take 1-2 tablets by mouth every 4 (four) hours as needed for moderate pain.  . Probiotic Product (SOLUBLE FIBER/PROBIOTICS PO) Take 1 tablet by  mouth daily.  . sitaGLIPtin (JANUVIA) 100 MG tablet Take 100 mg by mouth daily.  . [DISCONTINUED] capecitabine (XELODA) 500 MG tablet Take 3 tablets (1,500 mg total) by mouth 2 (two) times daily after a meal.  . [DISCONTINUED] ondansetron (ZOFRAN ODT) 8 MG disintegrating tablet Take 1 tablet (8 mg total) by mouth every 8 (eight) hours as needed for nausea.    EXAM:  BP 146/90  Pulse 97  Temp(Src) 97.5 F (36.4 C) (Tympanic)  Wt 117 lb (53.071 kg)  SpO2 98%  Body mass index is 20.07 kg/(m^2).  GENERAL: vitals reviewed and listed above, alert, oriented, appears well hydrated and in no acute distress here with sister.  Has had obvious weight loss  Nl speech cognition no increase resp effort.  HEENT: atraumatic, conjunctiva  Right reddened  And mild edema , no obvious abnormalities on inspection of external nose and ears   NECK: no obvious masses on inspection palpation  LUNGS:  healing thoracot scar on right  bs dec on right  Clear left no rales or rhonchi  CV: HRRR, no clubbing cyanosis or  peripheral edema nl cap refill   No murmur heard upright today  MS: moves all extremities without noticeable focal  Abnormality left upper arm with small 1 cm sioft cystic feeling area ? At attachment of  Deltoid  Only mildly tender on palpation  And flexion of arm  Shoulder joint ok axilla no adenopathy no edema of arm  No skin changes  PSYCH: pleasant and cooperative, no obvious depression or anxiety  ASSESSMENT AND PLAN:  Discussed the following assessment and plan:  Left upper arm pain - ? tendon cyst  observe (tender with active rom ) recheck if progressive persistent   Anemia - s/P transfusion - Plan: CBC with Differential, Comprehensive metabolic panel  Rectal cancer metastasized to lung - s/p met removal and under rad rx  - Plan: CBC with Differential, Comprehensive metabolic panel  Shortness of breath - no acute findings  recovering from thoracotomy do not suspect other process at this  time - Plan: CBC with Differential, Comprehensive metabolic panel  Adverse drug effect, initial encounter  -Patient advised to return or  notify health care team  if symptoms worsen or persist or new concerns arise.  Patient Instructions  Follow the area on the left  Arm  If  Enlarging or concern let it be rechecked .  Will notify you  of labs when available.  Will send labs to dr Alba Cory K. Panosh M.D.  Pre visit review using our clinic review tool, if applicable. No additional management support is needed unless otherwise documented below in the visit note.

## 2013-03-21 NOTE — Patient Instructions (Signed)
Follow the area on the left  Arm  If  Enlarging or concern let it be rechecked .  Will notify you  of labs when available.  Will send labs to dr Welton Flakes

## 2013-03-22 ENCOUNTER — Telehealth: Payer: Self-pay | Admitting: Internal Medicine

## 2013-03-22 ENCOUNTER — Ambulatory Visit
Admission: RE | Admit: 2013-03-22 | Discharge: 2013-03-22 | Disposition: A | Discharge status: Home / Self Care | Payer: 59 | Source: Ambulatory Visit | Attending: Radiation Oncology | Admitting: Radiation Oncology

## 2013-03-22 NOTE — Telephone Encounter (Signed)
Pt would ljke to discuss labs w./ you further. pls call back.

## 2013-03-22 NOTE — Telephone Encounter (Signed)
Pt called

## 2013-03-24 ENCOUNTER — Encounter: Payer: Self-pay | Admitting: Radiation Oncology

## 2013-03-24 ENCOUNTER — Ambulatory Visit
Admission: RE | Admit: 2013-03-24 | Discharge: 2013-03-24 | Disposition: A | Discharge status: Home / Self Care | Payer: 59 | Source: Ambulatory Visit | Attending: Radiation Oncology | Admitting: Radiation Oncology

## 2013-03-24 VITALS — BP 126/81 | HR 76 | Resp 16 | Wt 117.7 lb

## 2013-03-24 DIAGNOSIS — C20 Malignant neoplasm of rectum: Secondary | ICD-10-CM

## 2013-03-24 NOTE — Progress Notes (Signed)
  Radiation Oncology         [phone] ________________________________  Name: JONELL BRUMBAUGH MRN: [id number]  Date: 03/24/2013  DOB: [birth year]  Weekly Radiation Therapy Management  Current Dose: 28 Gy     Planned Dose:  50 Gy  Narrative . . . . . . . . The patient presents for routine under treatment assessment.  She empirically reduced her Xeloda from 3 pills twice daily down to 2 pills twice daily as a result. She notes that her diarrhea has improved slightly but wishes to reduce resolution further potentially. She stopped taking MiraLAX.                                   The patient is without complaint.                                 Set-up films were reviewed.                                 The chart was checked. Physical Findings. . .  weight is 117 lb 11.2 oz (53.388 kg). Her blood pressure is 126/81 and her pulse is 76. Her respiration is 16. . Weight decreased.  No significant changes. Impression . . . . . . . The patient is tolerating radiation. Plan . . . . . . . . . . . . Continue treatment as planned. Thank urged the patient to continue her Xeloda at 2 pills twice daily and try taking Imodium right ear one pill each bedtime.  ________________________________  Artist Pais Kathrynn Running, M.D.

## 2013-03-24 NOTE — Progress Notes (Signed)
Reports passing on average 2-3 small flaky stools daily. Report bor bor ygmi continues. Denies frank blood in stool. Denies dysuria or hematuria. Reports occasional nausea. Reports stomach discomfort but, denies pain. 7 lb weight loss noted related to effects of xeloda. Right eye red, tearing and swollen. Reports taking bacitracin for ulcer in right eye then, finding out she was allergic. Now taking erythomycin for eye and seeing an improvement.

## 2013-03-27 ENCOUNTER — Other Ambulatory Visit: Payer: Self-pay | Admitting: Emergency Medicine

## 2013-03-27 ENCOUNTER — Ambulatory Visit
Admission: RE | Admit: 2013-03-27 | Discharge: 2013-03-27 | Disposition: A | Discharge status: Home / Self Care | Payer: 59 | Source: Ambulatory Visit | Attending: Radiation Oncology | Admitting: Radiation Oncology

## 2013-03-27 DIAGNOSIS — G8918 Other acute postprocedural pain: Secondary | ICD-10-CM

## 2013-03-27 MED ORDER — OXYCODONE-ACETAMINOPHEN 5-325 MG PO TABS: 1.0000 | ORAL_TABLET | ORAL | Status: DC | PRN

## 2013-03-28 ENCOUNTER — Ambulatory Visit
Admission: RE | Admit: 2013-03-28 | Discharge: 2013-03-28 | Disposition: A | Discharge status: Home / Self Care | Payer: 59 | Source: Ambulatory Visit | Attending: Radiation Oncology | Admitting: Radiation Oncology

## 2013-03-29 ENCOUNTER — Ambulatory Visit
Admission: RE | Admit: 2013-03-29 | Discharge: 2013-03-29 | Disposition: A | Discharge status: Home / Self Care | Payer: 59 | Source: Ambulatory Visit | Attending: Radiation Oncology | Admitting: Radiation Oncology

## 2013-03-31 ENCOUNTER — Encounter: Payer: Self-pay | Admitting: Radiation Oncology

## 2013-03-31 ENCOUNTER — Ambulatory Visit
Admission: RE | Admit: 2013-03-31 | Discharge: 2013-03-31 | Disposition: A | Discharge status: Home / Self Care | Payer: 59 | Source: Ambulatory Visit | Attending: Radiation Oncology | Admitting: Radiation Oncology

## 2013-03-31 VITALS — BP 128/78 | HR 82 | Resp 16 | Wt 117.4 lb

## 2013-03-31 DIAGNOSIS — C7800 Secondary malignant neoplasm of unspecified lung: Principal | ICD-10-CM

## 2013-03-31 DIAGNOSIS — C20 Malignant neoplasm of rectum: Secondary | ICD-10-CM

## 2013-03-31 NOTE — Progress Notes (Signed)
   Department of Radiation Oncology  Phone:  [phone] Fax:        [phone]  Weekly Treatment Note    Name: Anne [name] Date: 03/31/2013 MRN: [id number] DOB: 1945/11/23   Current dose: 36 Gy  Current fraction: 18   MEDICATIONS: Current Outpatient Prescriptions  Medication Sig Dispense Refill  . Calcium Carbonate (CALCIUM 600 PO) Take by mouth.      . estradiol (MINIVELLE) 0.0375 MG/24HR Place 1 patch onto the skin 2 (two) times a week. Sundays and Thursdays      . metFORMIN (GLUCOPHAGE) 1000 MG tablet Take 1,000 mg by mouth 2 (two) times daily with a meal.      . Multiple Vitamin (MULTIVITAMIN) tablet Take 1 tablet by mouth daily. Senior      . omeprazole (PRILOSEC) 40 MG capsule Take 1 capsule (40 mg total) by mouth daily.  30 capsule  6  . oxyCODONE-acetaminophen (PERCOCET/ROXICET) 5-325 MG per tablet Take 1-2 tablets by mouth every 4 (four) hours as needed for moderate pain.  180 tablet  0  . Probiotic Product (SOLUBLE FIBER/PROBIOTICS PO) Take 1 tablet by mouth daily.      . sitaGLIPtin (JANUVIA) 100 MG tablet Take 100 mg by mouth daily.       No current facility-administered medications for this encounter.     ALLERGIES: Ativan; Midazolam; Zolpidem tartrate; Bacitracin; Moviprep; Zolpidem; and Neomycin-bacitracin zn-polymyx   LABORATORY DATA:  Lab Results  Component Value Date   WBC 6.1 03/21/2013   HGB 12.1 03/21/2013   HCT 36.6 03/21/2013   MCV 87.5 03/21/2013   PLT 390.0 03/21/2013   Lab Results  Component Value Date   NA 135 03/21/2013   K 4.0 03/21/2013   CL 97 03/21/2013   CO2 31 03/21/2013   Lab Results  Component Value Date   ALT 16 03/21/2013   AST 16 03/21/2013   ALKPHOS 68 03/21/2013   BILITOT 0.5 03/21/2013     NARRATIVE: Anne [name] was seen today for weekly treatment management. The chart was checked and the patient's films were reviewed. The patient is doing well overall she states. She has been losing some weight. She is  drinking Ensure 1-2 cans per day.  PHYSICAL EXAMINATION: weight is 117 lb 6.4 oz (53.252 kg). Her blood pressure is 128/78 and her pulse is 82. Her respiration is 16.      sitting in a wheelchair, in no acute distress  ASSESSMENT: The patient is doing satisfactorily with treatment.  PLAN: We will continue with the patient's radiation treatment as planned. We discussed the patient's weight loss. We discussed increasing her in sure to approximately 2-3 per day. We will continue to follow her weight and make further recommendations as appropriate.

## 2013-03-31 NOTE — Progress Notes (Signed)
Reports intense abdominal cramping pain following meal so severe at time it brings her to tears. Reports drinking 1-2 cans of Ensure per day. Five lb weight loss noted since 03/15/14. Reports her perianal area is sore but, not unbearable. Reports her left shoulder continues to hurt when she presses against something to push up. Reports this pain is felt mid shaft of her left humerus. Reports nausea continues but, denies emesis. Denies headache.

## 2013-04-03 ENCOUNTER — Ambulatory Visit
Admission: RE | Admit: 2013-04-03 | Discharge: 2013-04-03 | Disposition: A | Discharge status: Home / Self Care | Payer: 59 | Source: Ambulatory Visit | Attending: Radiation Oncology | Admitting: Radiation Oncology

## 2013-04-04 ENCOUNTER — Ambulatory Visit
Admission: RE | Admit: 2013-04-04 | Discharge: 2013-04-04 | Disposition: A | Discharge status: Home / Self Care | Payer: 59 | Source: Ambulatory Visit | Attending: Radiation Oncology | Admitting: Radiation Oncology

## 2013-04-04 ENCOUNTER — Ambulatory Visit (HOSPITAL_COMMUNITY)
Admission: RE | Admit: 2013-04-04 | Discharge: 2013-04-04 | Disposition: A | Discharge status: Home / Self Care | Payer: 59 | Source: Ambulatory Visit | Attending: Oncology | Admitting: Oncology

## 2013-04-04 ENCOUNTER — Other Ambulatory Visit (HOSPITAL_BASED_OUTPATIENT_CLINIC_OR_DEPARTMENT_OTHER): Payer: 59

## 2013-04-04 ENCOUNTER — Telehealth: Payer: Self-pay | Admitting: Oncology

## 2013-04-04 ENCOUNTER — Encounter: Payer: Self-pay | Admitting: Oncology

## 2013-04-04 ENCOUNTER — Ambulatory Visit (HOSPITAL_BASED_OUTPATIENT_CLINIC_OR_DEPARTMENT_OTHER): Payer: 59 | Admitting: Oncology

## 2013-04-04 VITALS — BP 144/86 | HR 92 | Temp 97.9°F | Resp 17 | Ht 64.0 in | Wt 116.7 lb

## 2013-04-04 DIAGNOSIS — C20 Malignant neoplasm of rectum: Secondary | ICD-10-CM | POA: Insufficient documentation

## 2013-04-04 DIAGNOSIS — C7800 Secondary malignant neoplasm of unspecified lung: Secondary | ICD-10-CM

## 2013-04-04 DIAGNOSIS — M79609 Pain in unspecified limb: Secondary | ICD-10-CM | POA: Insufficient documentation

## 2013-04-04 DIAGNOSIS — D649 Anemia, unspecified: Secondary | ICD-10-CM

## 2013-04-04 LAB — CBC WITH DIFFERENTIAL/PLATELET
BASO%: 0.6 % (ref 0.0–2.0)
Basophils Absolute: 0 10*3/uL (ref 0.0–0.1)
EOS ABS: 0.4 10*3/uL (ref 0.0–0.5)
EOS%: 7 % (ref 0.0–7.0)
HCT: 37.2 % (ref 34.8–46.6)
HGB: 12.4 g/dL (ref 11.6–15.9)
LYMPH%: 8.8 % — AB (ref 14.0–49.7)
MCH: 30.5 pg (ref 25.1–34.0)
MCHC: 33.3 g/dL (ref 31.5–36.0)
MCV: 91.6 fL (ref 79.5–101.0)
MONO#: 0.4 10*3/uL (ref 0.1–0.9)
MONO%: 7.4 % (ref 0.0–14.0)
NEUT#: 3.9 10*3/uL (ref 1.5–6.5)
NEUT%: 76.2 % (ref 38.4–76.8)
PLATELETS: 322 10*3/uL (ref 145–400)
RBC: 4.06 10*6/uL (ref 3.70–5.45)
RDW: 21.3 % — ABNORMAL HIGH (ref 11.2–14.5)
WBC: 5.1 10*3/uL (ref 3.9–10.3)
lymph#: 0.5 10*3/uL — ABNORMAL LOW (ref 0.9–3.3)

## 2013-04-04 LAB — COMPREHENSIVE METABOLIC PANEL (CC13)
ALK PHOS: 55 U/L (ref 40–150)
ALT: 16 U/L (ref 0–55)
AST: 17 U/L (ref 5–34)
Albumin: 3.5 g/dL (ref 3.5–5.0)
Anion Gap: 9 mEq/L (ref 3–11)
BUN: 10.3 mg/dL (ref 7.0–26.0)
CO2: 29 mEq/L (ref 22–29)
Calcium: 9.9 mg/dL (ref 8.4–10.4)
Chloride: 99 mEq/L (ref 98–109)
Creatinine: 0.6 mg/dL (ref 0.6–1.1)
GLUCOSE: 141 mg/dL — AB (ref 70–140)
Potassium: 4.1 mEq/L (ref 3.5–5.1)
Sodium: 138 mEq/L (ref 136–145)
TOTAL PROTEIN: 6.4 g/dL (ref 6.4–8.3)
Total Bilirubin: 0.37 mg/dL (ref 0.20–1.20)

## 2013-04-04 NOTE — Telephone Encounter (Signed)
, °

## 2013-04-04 NOTE — Progress Notes (Signed)
OFFICE PROGRESS NOTE  CC  Anne Dawson, MD [place] [address]  DIAGNOSIS: 68 year old female with new diagnosis of rectal cancer with lung metastasis   PRIOR THERAPY: #17/27/2014 with acute episode of bright red blood per rectum and large clot formation, prompting her to be evaluated at the ED. Of note,colonoscopy prior to this admission had been performed on 03/04/10(screening) which showed three polyps ,largest 8 mm, removed, as well as moderate diverticulosis in the sigmoid colon, otherwise normal exam . On presentation, she had Positive Guaiac and a rectal mass had been palpated on exam. On 7/29 she underwent colonoscopy (Dr. Henrene Pastor) which revealed a bulky, circumferential, friable mass lesion in the rectum of about 4 cm above the dentate line, subtotally obstructing and would not permit passage of the colonoscope proximally. Multiple biopsies were taken.  #2 patient's pathology revealed consistent with adenocarcinoma with primary from rectal. Lung biopsy was performed also that revealed metastatic disease.  #3 patient was seen by Dr. Roxan Hockey for possible resection of the lung metastasis. He recommended we proceed with chemotherapy first to see if we could do both lung metastases and with eventual resection. #4 patient is status post 5 cycles of FOLFOX 6 administered from 11/22/2012 through November 2014.  #5 patient is now status post resection of lung mass on 02/27/2013 by Dr. Erasmo Leventhal. Final pathology did reveal 1. Lung, resection (segmental or lobe), Right upper - ADENOCARCINOMA, CONSISTENT WITH METASTATIC COLORECTAL ADENOCARCINOMA, WELL TO MODERATELY DIFFERENTIATED, SPANNING 4.3 CM. - THE SURGICAL RESECTION MARGINS ARE NEGATIVE FOR ADENOCARCINOMA. - THERE IS NO EVIDENCE OF CARCINOMA IN 2 OF 2 LYMPH NODES (0/2). 2. Soft tissue, biopsy, Diaphragm margin #2 - THERE IS NO EVIDENCE OF MALIGNANCY. 3. Soft tissue, biopsy, Diaphragm #3 -  THERE IS NO EVIDENCE OF MALIGNANCY. 4. Lymph node, biopsy, Right 11 - THERE IS NO EVIDENCE OF CARCINOMA IN 1 OF 1 LYMPH NODE (0/1). - SEE COMMENT. 5. Lymph node, biopsy, Right 11 #2 - THERE IS NO EVIDENCE OF CARCINOMA IN 1 OF 1 LYMPH NODE (0/1). - SEE COMMENT. 6. Lymph node, biopsy, Right 11 #3 - THERE IS NO EVIDENCE OF CARCINOMA IN 1 OF 1 LYMPH NODE (0/1). - SEE COMMENT. 7. Lymph node, biopsy, Right 11 # 4 - THERE IS NO EVIDENCE OF CARCINOMA IN 1 OF 1 LYMPH NODE (0/1). - SEE COMMENT. 8. Lymph node, biopsy, Right #12 - THERE IS NO EVIDENCE OF CARCINOMA IN 1 OF 1 LYMPH NODE (0/1). - SEE COMMENT. 9. Lymph node, biopsy, Right #9 - THERE IS NO EVIDENCE OF CARCINOMA IN 1 OF 1 LYMPH NODE (0/1). - SEE COMMENT. #6 patient has been seen by Dr. Tyler Pita and has begun radiation therapy to the rectum. She will also need concurrent Xeloda.  CURRENT THERAPY:  Radiation therapy with concurrent radiosensitizing Xeloda  INTERVAL HISTORY: Anne [name] 67 y.o. female returns for followup visit .clinically patient looks fine. She is taking Xeloda. She did develop side effects on 1500 mg twice a day so therefore she has cut her dose 2000 mg twice a day. She is able to tolerate that quite nicely. She continues to have some diarrhea. She is denying any nausea or vomiting. Her appetite remains an issue as does her weight. We discussed this again. She denies any peripheral paresthesias no abnormal pain no chest pains no shortness of breath. Remainder of the 10 point review of systems is negative.  MED ICAL HISTORY: Past Medical History  Diagnosis Date  . Postmenopausal  HRT (hormone replacement therapy) 07/01/2011    surgical menopause  . Hx of hyperglycemia 07/01/2011  . Hx of compression fracture of spine 1985    t12 after a  20 foot fall  . Hx of fracture of wrist     after fall   . 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 Hx of colonic polyps   . Early cataract     stoneburner   . High  frequency hearing loss     [facility] followed   . Diabetes mellitus without complication   . Complication of anesthesia     pt hadpanic attacks with versed, no versed  . Status post chemotherapy     completed a total of three cycles of Folfox   . Colon cancer     rectal cancer with lung metastasis  . Skin cancer     multiple  . Heart murmur, systolic 06/05/1015    innocent per Dr. Gershon Mussel   . Asthma     as a child none since 19 years old  . Headache(784.0)     ALLERGIES:  is allergic to ativan; midazolam; zolpidem tartrate; bacitracin; moviprep; zolpidem; and neomycin-bacitracin zn-polymyx.  MEDICATIONS:  Current Outpatient Prescriptions  Medication Sig Dispense Refill  . Calcium Carbonate (CALCIUM 600 PO) Take by mouth.      . estradiol (MINIVELLE) 0.0375 MG/24HR Place 1 patch onto the skin 2 (two) times a week. Sundays and Thursdays      . metFORMIN (GLUCOPHAGE) 1000 MG tablet Take 1,000 mg by mouth 2 (two) times daily with a meal.      . Multiple Vitamin (MULTIVITAMIN) tablet Take 1 tablet by mouth daily. Senior      . omeprazole (PRILOSEC) 40 MG capsule Take 1 capsule (40 mg total) by mouth daily.  30 capsule  6  . oxyCODONE-acetaminophen (PERCOCET/ROXICET) 5-325 MG per tablet Take 1-2 tablets by mouth every 4 (four) hours as needed for moderate pain.  180 tablet  0  . Probiotic Product (SOLUBLE FIBER/PROBIOTICS PO) Take 1 tablet by mouth daily.      . sitaGLIPtin (JANUVIA) 100 MG tablet Take 100 mg by mouth daily.       No current facility-administered medications for this visit.    SURGICAL HISTORY:  Past Surgical History  Procedure Laterality Date  . Tonsillectomy  1952  . Adenoidectomy  1952  . Wrist surgery  1957    left reduction  . Compression fraction  1985    T12  . Anterior cruciate ligament repair  1993    right [place]  . Cosmetic surgery  1999-2007    holderness blepharoplasty facial rhytidectomy mastopexy abdominoplasty   . Dental implant    . Dilation and  curettage of uterus  1977  . Total abdominal hysterectomy  2005    with cystocele repair   . Colonoscopy N/A 10/24/2012    Procedure: COLONOSCOPY;  Surgeon: Irene Shipper, MD;  Location: [facility];  Service: Endoscopy;  Laterality: N/A;  . Colonoscopy N/A 10/25/2012    Procedure: COLONOSCOPY;  Surgeon: Irene Shipper, MD;  Location: [facility];  Service: Endoscopy;  Laterality: N/A;  . Video bronchoscopy Bilateral 10/27/2012    Procedure: VIDEO BRONCHOSCOPY WITH FLUORO;  Surgeon: Chesley Mires, MD;  Location: [facility] ENDOSCOPY;  Service: Endoscopy;  Laterality: Bilateral;  . Portacath placement Left 11/21/2012    Procedure: INSERTION PORT-A-CATH;  Surgeon: Melrose Nakayama, MD;  Location: [facility];  Service: Thoracic;  Laterality: Left;  .  Video assisted thoracoscopy Right 12/21/2012    Procedure: VIDEO ASSISTED THORACOSCOPY;  Surgeon: Melrose Nakayama, MD;  Location: [facility];  Service: Thoracic;  Laterality: Right;  . Pleural effusion drainage Right 12/21/2012    Procedure: DRAINAGE OF PLEURAL EFFUSION;  Surgeon: Melrose Nakayama, MD;  Location: [facility];  Service: Thoracic;  Laterality: Right;  . Decortication Right 12/21/2012    Procedure: DECORTICATION;  Surgeon: Melrose Nakayama, MD;  Location: [place];  Service: Thoracic;  Laterality: Right;  . Thorocotomy with lobectomy Right 02/27/2013    Procedure: THOROCOTOMY WITH BI- LOBECTOMY;  Surgeon: Melrose Nakayama, MD;  Location: [facility];  Service: Thoracic;  Laterality: Right;    REVIEW OF SYSTEMS:  Pertinent items are noted in HPI.   HEALTH MAINTENANCE:   PHYSICAL EXAMINATION: Blood pressure 144/86, pulse 92, temperature 97.9 F (36.6 C), temperature source Oral, resp. rate 17, height 5\' 4"  (1.626 m), weight 116 lb 11.2 oz (52.935 kg). Body mass index is 20.02 kg/(m^2). General: Patient is a well appearing female in no acute distress HEENT: PERRLA, sclerae anicteric no conjunctival pallor, MMM Neck: supple, no palpable adenopathy Lungs:  clear to auscultation bilaterally, no wheezes, rhonchi, or rales Cardiovascular: regular rate rhythm, S1, S2, no murmurs, rubs or gallops Abdomen: Soft, non-tender, non-distended, normoactive bowel sounds, no HSM Extremities: warm and well perfused, no clubbing, cyanosis, or edema Skin: No rashes or lesions Neuro: Non-focal ECOG PERFORMANCE STATUS: 0 - Asymptomatic  LABORATORY DATA: Lab Results  Component Value Date   WBC 5.1 04/04/2013   HGB 12.4 04/04/2013   HCT 37.2 04/04/2013   MCV 91.6 04/04/2013   PLT 322 04/04/2013      Chemistry      Component Value Date/Time   NA 138 04/04/2013 1200   NA 135 03/21/2013 1550   K 4.1 04/04/2013 1200   K 4.0 03/21/2013 1550   CL 97 03/21/2013 1550   CO2 29 04/04/2013 1200   CO2 31 03/21/2013 1550   BUN 10.3 04/04/2013 1200   BUN 10 03/21/2013 1550   CREATININE 0.6 04/04/2013 1200   CREATININE 0.3* 03/21/2013 1550      Component Value Date/Time   CALCIUM 9.9 04/04/2013 1200   CALCIUM 9.4 03/21/2013 1550   ALKPHOS 55 04/04/2013 1200   ALKPHOS 68 03/21/2013 1550   AST 17 04/04/2013 1200   AST 16 03/21/2013 1550   ALT 16 04/04/2013 1200   ALT 16 03/21/2013 1550   BILITOT 0.37 04/04/2013 1200   BILITOT 0.5 03/21/2013 1550       RADIOGRAPHIC STUDIES:  Dg Chest 2 View Within Previous 72 Hours.  Films Obtained On Friday Are Acceptable For Monday And Tuesday Cases  11/18/2012   CLINICAL DATA:  68 year old female preoperative study. Lung mass. Colon cancer.  EXAM: CHEST  2 VIEW  COMPARISON:  PET-CT 11/02/2012 and earlier.  FINDINGS: 5 cm right lower lobe lung mass re- identified. Radiographically this appears not significantly changed since 10/25/2012. The 6 mm cavitary right upper lobe pulmonary nodule is faint but visible. Stable lung volumes. Stable cardiac size and mediastinal contours. The left lung is clear. No pneumothorax or effusion. Stable visualized osseous structures. Chronic T12 compression fracture. Flowing thoracic osteophytes.  IMPRESSION: Known  right lung lesions. No new cardiopulmonary abnormality.   Electronically Signed   By: Lars Pinks   On: 11/18/2012 15:53   Nm Pet Image Initial (pi) Skull Base To Thigh  11/02/2012   *RADIOLOGY REPORT*  Clinical Data:  Initial treatment strategy for  rectal cancer. Circumferential rectal cancer 4 cm from the anal verge.  4 cm right lung mass.  NUCLEAR MEDICINE PET WHOLE BODY  Fasting Blood Glucose:  123  Technique:  19.1 mCi F-18 FDG was injected intravenously. CT data was obtained and used for attenuation correction and anatomic localization only.  (This was not acquired as a diagnostic CT examination.) Additional exam technical data entered on technologist worksheet.  Comparison:  CT scan from 10/25/2012  Findings:  Head/Neck:   No hypermetabolic lymph nodes in the neck.  Chest:  The previously characterized 5 cm right lower lobe pulmonary mass is hypermetabolic with SUV max = 8.  No evidence for hypermetabolic lymphadenopathy in the mediastinum or either hilum. No other hypermetabolic lung lesions are evident although the patient does have bilateral small pulmonary parenchymal nodules (see posterior right upper lobe on image 73 and posterior right lower lobe on image 78).  Both of these small nodules are below the accepted size threshold for reliable resolution on PET imaging.  Abdomen/Pelvis:  No abnormal hypermetabolism is identified within the liver.  No hypermetabolic lymphadenopathy in the abdomen.  There is a prominent amount of physiologic F D G uptake in the colon, but this is associated with hypermetabolic uptake in a region of circumferential rectal wall thickening.  SUV max = 9 for the rectal hypermetabolism.  Skeleton:  No focal hypermetabolic activity to suggest skeletal metastasis.  IMPRESSION: Hypermetabolism in the area of apparent circumferential rectal wall thickening associate with hypermetabolic 5 cm right lower lobe pulmonary mass.  6 mm cavitary pulmonary nodule the posterior right upper lobe  is not hypermetabolic on PET imaging, but is below the accepted size threshold for reliable resolution on PET.  A second tiny right lower lobe nodule is also noted.   Original Report Authenticated By: Misty Stanley, M.D.   [place] Report  11/21/2012   CLINICAL DATA: Done in OR # 10,Intraoperative insertion port a cath   FLOURO GUIDE CV LINE  Fluoroscopy was utilized by the requesting physician.  No radiographic  interpretation.     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 She is in oligo metastases patient is very interested in being aggressive with her disease.She will receive a total of 6 cycles of FOLFOX 6. This will then be followed by radiation with radiosensitizing Xeloda. She will then finish up another 6 cycles of FOLFOX 6.  #2 patient's course was complicated after 2 cycles with hospitalization for pneumonia/loculated pleural effusion she did require VATs procedure with drainage of the pleural effusion. Her cultures did reveal strep. She has been on antibiotics. She is completed up and 10 now. She seems to be doing much better.  #3 patient is status post cycle 4 of FOLFOX. Overall she tolerated it well.  #4 patient was seen by Dr. Vita Barley at [facility]. He has recommended the possibility of doing the colon surgery first. We will try to coordinate this. Certainly we could after the fifth cycle of chemotherapy she is going to have a proctoscope performed at [facility]. After that she certainly could begin radiation therapy with radiosensitizing Xeloda. When she completes this she will proceed with her surgery. Once she recovers from it we certainly could get her started back on radiation therapy  #5 patient is status post thoracotomy for resection of metastatic deposit in the lung. Final pathology as noted above.  #6 patient with anemia that is symptomatic. We will transfuse  her with packed red cells 2  units.  #7 patient will be receiving radiation therapy to the rectal mass we will add Xeloda as a radiosensitizer. We discussed rationale for this. We discussed side effects.  PLAN:   #1 continue radiation therapy with addition of Xeloda at 1000 mg per meter squared twice a day twice a day. Risks benefits and side effects were discussed.  #2 patient will return in 4 week's time for followup. In #3 we also discussed exercise eating healthy maintaining a good weight.  All questions were answered. The patient knows to call the clinic with any problems, questions or concerns. We can certainly see the patient much sooner if necessary.  I spent 25 minutes counseling the patient face to face. The total time spent in the appointment was 30 minutes.  Marcy Panning, MD Medical/Oncology [facility] [phone] (beeper) [phone] (Office)

## 2013-04-05 ENCOUNTER — Ambulatory Visit
Admission: RE | Admit: 2013-04-05 | Discharge: 2013-04-05 | Disposition: A | Discharge status: Home / Self Care | Payer: 59 | Source: Ambulatory Visit | Attending: Radiation Oncology | Admitting: Radiation Oncology

## 2013-04-05 NOTE — Progress Notes (Signed)
Quick Note:  Please call patient: no evidence of bone lesions ______

## 2013-04-06 ENCOUNTER — Other Ambulatory Visit: Payer: Self-pay | Admitting: *Deleted

## 2013-04-06 ENCOUNTER — Ambulatory Visit
Admission: RE | Admit: 2013-04-06 | Discharge: 2013-04-06 | Disposition: A | Discharge status: Home / Self Care | Payer: 59 | Source: Ambulatory Visit | Attending: Radiation Oncology | Admitting: Radiation Oncology

## 2013-04-06 DIAGNOSIS — R918 Other nonspecific abnormal finding of lung field: Secondary | ICD-10-CM

## 2013-04-07 ENCOUNTER — Ambulatory Visit
Admission: RE | Admit: 2013-04-07 | Discharge: 2013-04-07 | Disposition: A | Discharge status: Home / Self Care | Payer: 59 | Source: Ambulatory Visit | Attending: Radiation Oncology | Admitting: Radiation Oncology

## 2013-04-07 ENCOUNTER — Encounter: Payer: Self-pay | Admitting: Radiation Oncology

## 2013-04-07 VITALS — BP 142/75 | HR 99 | Resp 16 | Wt 116.5 lb

## 2013-04-07 DIAGNOSIS — C20 Malignant neoplasm of rectum: Secondary | ICD-10-CM

## 2013-04-07 DIAGNOSIS — C7800 Secondary malignant neoplasm of unspecified lung: Principal | ICD-10-CM

## 2013-04-07 NOTE — Progress Notes (Signed)
Patient reports crying on treatment table today due to intense pubic bone pain while in treatment position. Reports taking two oxycodone every four hours to keep "pain at bay." Reports frequent episodes of diarrhea. One pound weight loss noted since last week. Reports that she is not eating well because of the intense abdominal cramping food causes. Reports persistent nausea. Patient completes radiation treatment on Tuesday. Reports that she has an appointment with [place] surgeons on January 20th.

## 2013-04-07 NOTE — Progress Notes (Signed)
  Radiation Oncology         [phone] ________________________________  Name: Anne [name] MRN: [id number]  Date: 04/07/2013  DOB: 02-03-1946  Weekly Radiation Therapy Management  Current Dose: 46 Gy     Planned Dose:  50 Gy  Narrative . . . . . . . . The patient presents for routine under treatment assessment.                                   The patient is without complaint.  She is having some abdominal cramping today.                                 Set-up films were reviewed.                                 The chart was checked. Physical Findings. . .  weight is 116 lb 8 oz (52.844 kg). Her blood pressure is 142/75 and her pulse is 99. Her respiration is 16. . Weight essentially stable.  No significant changes. Impression . . . . . . . The patient is tolerating radiation. Plan . . . . . . . . . . . . Continue treatment as planned.  ________________________________  Sheral Apley. Tammi Klippel, M.D.

## 2013-04-10 ENCOUNTER — Other Ambulatory Visit: Payer: Self-pay | Admitting: Adult Health

## 2013-04-10 ENCOUNTER — Ambulatory Visit
Admission: RE | Admit: 2013-04-10 | Discharge: 2013-04-10 | Disposition: A | Discharge status: Home / Self Care | Payer: 59 | Source: Ambulatory Visit | Attending: Radiation Oncology | Admitting: Radiation Oncology

## 2013-04-10 DIAGNOSIS — G8918 Other acute postprocedural pain: Secondary | ICD-10-CM

## 2013-04-10 MED ORDER — OXYCODONE-ACETAMINOPHEN 5-325 MG PO TABS: 1.0000 | ORAL_TABLET | ORAL | Status: DC | PRN

## 2013-04-11 ENCOUNTER — Ambulatory Visit
Admission: RE | Admit: 2013-04-11 | Discharge: 2013-04-11 | Disposition: A | Discharge status: Home / Self Care | Payer: 59 | Source: Ambulatory Visit | Attending: Radiation Oncology | Admitting: Radiation Oncology

## 2013-04-11 ENCOUNTER — Encounter: Payer: Self-pay | Admitting: Thoracic Surgery (Cardiothoracic Vascular Surgery)

## 2013-04-11 ENCOUNTER — Ambulatory Visit (INDEPENDENT_AMBULATORY_CARE_PROVIDER_SITE_OTHER): Payer: 59 | Admitting: Thoracic Surgery (Cardiothoracic Vascular Surgery)

## 2013-04-11 ENCOUNTER — Encounter: Payer: Self-pay | Admitting: Radiation Oncology

## 2013-04-11 VITALS — BP 147/81 | HR 99 | Resp 16 | Ht 64.0 in | Wt 116.0 lb

## 2013-04-11 DIAGNOSIS — C7800 Secondary malignant neoplasm of unspecified lung: Secondary | ICD-10-CM

## 2013-04-11 DIAGNOSIS — Z9889 Other specified postprocedural states: Secondary | ICD-10-CM

## 2013-04-11 DIAGNOSIS — Z902 Acquired absence of lung [part of]: Secondary | ICD-10-CM

## 2013-04-11 DIAGNOSIS — C20 Malignant neoplasm of rectum: Secondary | ICD-10-CM

## 2013-04-11 NOTE — Progress Notes (Signed)
HPI:  Dr. [name] returns today for a scheduled followup visit.  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 She underwent a right thoracotomy and bilobectomy with en bloc resection of diaphragm and the intercostal muscles on December 1. She was last seen in the office on December 16 at which time she was doing reasonably well but still quite sore.  She has been undergoing radiation for her pelvic tumor. She is scheduled to have her last treatment today. She then will have a resection in 4-6 weeks.  She says she is doing well from a respiratory standpoint. She has not had any shortness of breath or wheezing. She is not having any significant pain from her thoracotomy incision. She has lost a great deal of weight and her appetite remains poor. She did have a knot on her left arm after surgery that may have been related to positioning. A x-ray of the humerus was done and showed no mass. She also had an ulcer on her cornea after the surgery, which her ophthalmologist said is likely related to the eye being partially open. Both of those issues are improving    Past Medical History  Diagnosis Date  . Postmenopausal HRT (hormone replacement therapy) 07/01/2011    surgical menopause  . Hx of hyperglycemia 07/01/2011  . Hx of compression fracture of spine 1985    t12 after a  20 foot fall  . Hx of fracture of wrist     after fall   . 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 Hx of colonic polyps   . Early cataract     stoneburner   . High frequency hearing loss     [facility] followed   . Diabetes mellitus without complication   . Complication of anesthesia     pt hadpanic attacks with versed, no versed  . Status post chemotherapy     completed a total of three cycles of Folfox   . Colon cancer     rectal cancer with lung metastasis  . Skin cancer     multiple  . Heart murmur, systolic 3/0/8657    innocent per Dr. Gershon Mussel   . Asthma     as a child none  since 65 years old  . QIONGEXB(284.1)       Current Outpatient Prescriptions  Medication Sig Dispense Refill  . Calcium Carbonate (CALCIUM 600 PO) Take by mouth.      . estradiol (MINIVELLE) 0.0375 MG/24HR Place 1 patch onto the skin 2 (two) times a week. Sundays and Thursdays      . metFORMIN (GLUCOPHAGE) 1000 MG tablet Take 1,000 mg by mouth 2 (two) times daily with a meal.      . Multiple Vitamin (MULTIVITAMIN) tablet Take 1 tablet by mouth daily. Senior      . omeprazole (PRILOSEC) 40 MG capsule Take 1 capsule (40 mg total) by mouth daily.  30 capsule  6  . oxyCODONE-acetaminophen (PERCOCET/ROXICET) 5-325 MG per tablet Take 1-2 tablets by mouth every 4 (four) hours as needed for moderate pain.  180 tablet  0  . Probiotic Product (SOLUBLE FIBER/PROBIOTICS PO) Take 1 tablet by mouth daily.      . sitaGLIPtin (JANUVIA) 100 MG tablet Take 100 mg by mouth daily.       No current facility-administered medications for this visit.    Physical Exam BP 147/81  Pulse 99  Resp 16  Ht 5'  4" (1.626 m)  Wt 116 lb (52.617 kg)  BMI 19.90 kg/m2  SpO2 20% Thin 68 year old woman in no acute distress Cardiac regular rate and rhythm Diminished breath sounds at right base, lungs are otherwise clear  Diagnostic Tests: none  Impression: 68 year old woman who is now about 6 weeks out from a right bilobectomy with en bloc resection of a portion of the chest wall and diaphragm for metastatic rectal cancer. She really doing extremely well from the standpoint of her thoracic surgery. She has minimal discomfort and is having no respiratory issues.  Her primary problem continues to be poor appetite and weight loss. She completes her pelvic radiation today. Then, hopefully, she will be able to gain some weight before her planned resection in about 4 weeks.  I would like to see her back in about 6 weeks with a chest x-ray to check on her progress  Plan: Return in 6 weeks with PA and lateral chest  x-ray

## 2013-04-17 NOTE — Progress Notes (Signed)
  Radiation Oncology         [phone] ________________________________  Name: Anne [name] MRN: [id number]  Date: 04/11/2013  DOB: May 22, 1945  End of Treatment Note  Diagnosis:  68 yo woman with rectal cancer rectal cancer and a solitary lung metastasis  Indication for treatment:  Pre-op chemoradiotherapy for rectal cancer, curative.       Radiation treatment dates:   02/28/2013-04/11/2013  Site/dose:   The pelvis was treated to 45 Gy in 25 fractions of 1.8 Gy.  Using TomoTherapy, the tumor site received and integrated daily boost to receive 50 Gy at 2 Gy per fraction.  Beams/energy:   Immobilization and positioning was achieved with a belly board for prone positioning.  TomoTherapy delivered 6 MV X-rays to an inverse optimized helical IMRT plan with daily MV-CT for image-guidance and monitoring of bladder/bowel filling.  Narrative: The patient tolerated radiation treatment relatively well.   Patient reports episodic intense gas pains. At times she was  taking two oxycodone every four hours to keep "pain at bay." She experienced frequent episodes of diarrhea. She also had persistent nausea.    Plan: The patient has completed radiation treatment. The patient will return to radiation oncology clinic for routine followup in one month. I advised them to call or return sooner if they have any questions or concerns related to their recovery or treatment.  She reports that she has an appointment with [facility] on January 20th  ________________________________  Sheral Apley. Tammi Klippel, M.D.

## 2013-04-25 ENCOUNTER — Telehealth: Payer: Self-pay | Admitting: Oncology

## 2013-04-25 ENCOUNTER — Ambulatory Visit (HOSPITAL_BASED_OUTPATIENT_CLINIC_OR_DEPARTMENT_OTHER): Payer: 59 | Admitting: Oncology

## 2013-04-25 ENCOUNTER — Ambulatory Visit (HOSPITAL_BASED_OUTPATIENT_CLINIC_OR_DEPARTMENT_OTHER): Payer: 59

## 2013-04-25 ENCOUNTER — Other Ambulatory Visit: Payer: Self-pay | Admitting: Emergency Medicine

## 2013-04-25 ENCOUNTER — Encounter: Payer: Self-pay | Admitting: Oncology

## 2013-04-25 ENCOUNTER — Other Ambulatory Visit (HOSPITAL_BASED_OUTPATIENT_CLINIC_OR_DEPARTMENT_OTHER): Payer: 59

## 2013-04-25 VITALS — BP 132/81 | HR 90 | Temp 97.7°F | Resp 18 | Ht 64.0 in | Wt 118.0 lb

## 2013-04-25 DIAGNOSIS — C20 Malignant neoplasm of rectum: Secondary | ICD-10-CM

## 2013-04-25 DIAGNOSIS — M79609 Pain in unspecified limb: Secondary | ICD-10-CM

## 2013-04-25 DIAGNOSIS — C7800 Secondary malignant neoplasm of unspecified lung: Secondary | ICD-10-CM

## 2013-04-25 DIAGNOSIS — E46 Unspecified protein-calorie malnutrition: Secondary | ICD-10-CM

## 2013-04-25 DIAGNOSIS — Z95828 Presence of other vascular implants and grafts: Secondary | ICD-10-CM

## 2013-04-25 DIAGNOSIS — R109 Unspecified abdominal pain: Secondary | ICD-10-CM

## 2013-04-25 DIAGNOSIS — G8918 Other acute postprocedural pain: Secondary | ICD-10-CM

## 2013-04-25 DIAGNOSIS — R918 Other nonspecific abnormal finding of lung field: Secondary | ICD-10-CM

## 2013-04-25 DIAGNOSIS — M79622 Pain in left upper arm: Secondary | ICD-10-CM

## 2013-04-25 DIAGNOSIS — Z452 Encounter for adjustment and management of vascular access device: Secondary | ICD-10-CM

## 2013-04-25 LAB — COMPREHENSIVE METABOLIC PANEL (CC13)
ALT: 10 U/L (ref 0–55)
ANION GAP: 9 meq/L (ref 3–11)
AST: 12 U/L (ref 5–34)
Albumin: 3.4 g/dL — ABNORMAL LOW (ref 3.5–5.0)
Alkaline Phosphatase: 42 U/L (ref 40–150)
BILIRUBIN TOTAL: 0.29 mg/dL (ref 0.20–1.20)
BUN: 9.5 mg/dL (ref 7.0–26.0)
CO2: 26 meq/L (ref 22–29)
Calcium: 9.4 mg/dL (ref 8.4–10.4)
Chloride: 103 mEq/L (ref 98–109)
Creatinine: 0.6 mg/dL (ref 0.6–1.1)
GLUCOSE: 122 mg/dL (ref 70–140)
Potassium: 3.9 mEq/L (ref 3.5–5.1)
Sodium: 138 mEq/L (ref 136–145)
Total Protein: 5.7 g/dL — ABNORMAL LOW (ref 6.4–8.3)

## 2013-04-25 LAB — CBC WITH DIFFERENTIAL/PLATELET
BASO%: 0.8 % (ref 0.0–2.0)
Basophils Absolute: 0 10*3/uL (ref 0.0–0.1)
EOS ABS: 0.1 10*3/uL (ref 0.0–0.5)
EOS%: 2.1 % (ref 0.0–7.0)
HCT: 37.1 % (ref 34.8–46.6)
HGB: 12.5 g/dL (ref 11.6–15.9)
LYMPH%: 12.3 % — AB (ref 14.0–49.7)
MCH: 31.5 pg (ref 25.1–34.0)
MCHC: 33.7 g/dL (ref 31.5–36.0)
MCV: 93.6 fL (ref 79.5–101.0)
MONO#: 0.3 10*3/uL (ref 0.1–0.9)
MONO%: 8.7 % (ref 0.0–14.0)
NEUT#: 2.8 10*3/uL (ref 1.5–6.5)
NEUT%: 76.1 % (ref 38.4–76.8)
Platelets: 312 10*3/uL (ref 145–400)
RBC: 3.96 10*6/uL (ref 3.70–5.45)
RDW: 22.1 % — ABNORMAL HIGH (ref 11.2–14.5)
WBC: 3.7 10*3/uL — AB (ref 3.9–10.3)
lymph#: 0.5 10*3/uL — ABNORMAL LOW (ref 0.9–3.3)

## 2013-04-25 MED ORDER — OXYCODONE-ACETAMINOPHEN 5-325 MG PO TABS: 1.0000 | ORAL_TABLET | ORAL | Status: DC | PRN

## 2013-04-25 MED ORDER — SODIUM CHLORIDE 0.9 % IJ SOLN
10.0000 mL | INTRAMUSCULAR | Status: DC | PRN
  Administered 2013-04-25: 10 mL via INTRAVENOUS
  Filled 2013-04-25: qty 10

## 2013-04-25 MED ORDER — HEPARIN SOD (PORK) LOCK FLUSH 100 UNIT/ML IV SOLN
500.0000 [IU] | Freq: Once | INTRAVENOUS | Status: AC
  Administered 2013-04-25: 500 [IU] via INTRAVENOUS
  Filled 2013-04-25: qty 5

## 2013-04-25 MED ORDER — LIDOCAINE-PRILOCAINE 2.5-2.5 % EX CREA
TOPICAL_CREAM | CUTANEOUS | Status: AC
  Filled 2013-04-25: qty 5

## 2013-04-25 NOTE — Telephone Encounter (Signed)
, °

## 2013-04-25 NOTE — Progress Notes (Signed)
OFFICE PROGRESS NOTE  CC  Anne Dawson, MD [place] [address]  DIAGNOSIS: 68 year old female with new diagnosis of rectal cancer with lung metastasis   PRIOR THERAPY: #17/27/2014 with acute episode of bright red blood per rectum and large clot formation, prompting her to be evaluated at the ED. Of note,colonoscopy prior to this admission had been performed on 03/04/10(screening) which showed three polyps ,largest 8 mm, removed, as well as moderate diverticulosis in the sigmoid colon, otherwise normal exam . On presentation, she had Positive Guaiac and a rectal mass had been palpated on exam. On 7/29 she underwent colonoscopy (Dr. Henrene Pastor) which revealed a bulky, circumferential, friable mass lesion in the rectum of about 4 cm above the dentate line, subtotally obstructing and would not permit passage of the colonoscope proximally. Multiple biopsies were taken.  #2 patient's pathology revealed consistent with adenocarcinoma with primary from rectal. Lung biopsy was performed also that revealed metastatic disease.  #3 patient was seen by Dr. Roxan Hockey for possible resection of the lung metastasis. He recommended we proceed with chemotherapy first to see if we could do both lung metastases and with eventual resection. #4 patient is status post 5 cycles of FOLFOX 6 administered from 11/22/2012 through November 2014.  #5 patient is now status post resection of lung mass on 02/27/2013 by Dr. Erasmo Leventhal. Final pathology did reveal 1. Lung, resection (segmental or lobe), Right upper - ADENOCARCINOMA, CONSISTENT WITH METASTATIC COLORECTAL ADENOCARCINOMA, WELL TO MODERATELY DIFFERENTIATED, SPANNING 4.3 CM. - THE SURGICAL RESECTION MARGINS ARE NEGATIVE FOR ADENOCARCINOMA. - THERE IS NO EVIDENCE OF CARCINOMA IN 2 OF 2 LYMPH NODES (0/2). 2. Soft tissue, biopsy, Diaphragm margin #2 - THERE IS NO EVIDENCE OF MALIGNANCY. 3. Soft tissue, biopsy, Diaphragm #3 -  THERE IS NO EVIDENCE OF MALIGNANCY. 4. Lymph node, biopsy, Right 11 - THERE IS NO EVIDENCE OF CARCINOMA IN 1 OF 1 LYMPH NODE (0/1). - SEE COMMENT. 5. Lymph node, biopsy, Right 11 #2 - THERE IS NO EVIDENCE OF CARCINOMA IN 1 OF 1 LYMPH NODE (0/1). - SEE COMMENT. 6. Lymph node, biopsy, Right 11 #3 - THERE IS NO EVIDENCE OF CARCINOMA IN 1 OF 1 LYMPH NODE (0/1). - SEE COMMENT. 7. Lymph node, biopsy, Right 11 # 4 - THERE IS NO EVIDENCE OF CARCINOMA IN 1 OF 1 LYMPH NODE (0/1). - SEE COMMENT. 8. Lymph node, biopsy, Right #12 - THERE IS NO EVIDENCE OF CARCINOMA IN 1 OF 1 LYMPH NODE (0/1). - SEE COMMENT. 9. Lymph node, biopsy, Right #9 - THERE IS NO EVIDENCE OF CARCINOMA IN 1 OF 1 LYMPH NODE (0/1). - SEE COMMENT.  #6 status post radiation therapy to the rectum with concurrent Xeloda at 1000 mg twice a day. Patient completed all of her treatment from 03/07/2013 through 04/11/2013.   CURRENT THERAPY:  Clinical monitoring/recovery from radiation  INTERVAL HISTORY: Anne [name] 68 y.o. female returns for followup visit.her primary concern is abdominal pain which has been ongoing and persistent. It occurs primarily after eating. She thinks it is a peristaltic waves. It does go away when she takes OxyContin. She has been taking the OxyContin quite frequently. She has some associated nausea but no vomiting. She does not have any diarrhea but does have frequent bowel movements which are very small. She states that she is having a bowel movement every 3-4 hours even during and through the night. She has not had any vomiting. Patient does want to gain weight but is having great deal of  difficulty with eating and not enjoying her food. She is also complaining of ongoing pain in the right upper extremity at the site of the biceps muscle. An x-ray had been obtained on her last visit which was negative for any kind of underlying bone trauma. Question is whether this is muscular in nature. On exam there is a  palpable nodule. This could certainly be a small hematoma versus a neuroma. Patient is thinking that physical therapy may help and I have made a referral. I have also made a referral to dietary/nutrition consultation.  Patient was seen by her GI surgeon and her surgery is planned in the first week of March 2015. She will get a temporary colostomy. GI this is quite upset over this but is accepting.  MED ICAL HISTORY: Past Medical History  Diagnosis Date  . Postmenopausal HRT (hormone replacement therapy) 07/01/2011    surgical menopause  . Hx of hyperglycemia 07/01/2011  . Hx of compression fracture of spine 1985    t12 after a  20 foot fall  . Hx of fracture of wrist     after fall   . 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 Hx of colonic polyps   . Early cataract     stoneburner   . High frequency hearing loss     [facility] followed   . Diabetes mellitus without complication   . Complication of anesthesia     pt hadpanic attacks with versed, no versed  . Status post chemotherapy     completed a total of three cycles of Folfox   . Colon cancer     rectal cancer with lung metastasis  . Skin cancer     multiple  . Heart murmur, systolic 06/30/3534    innocent per Dr. Gershon Mussel   . Asthma     as a child none since 57 years old  . Headache(784.0)     ALLERGIES:  is allergic to ativan; midazolam; zolpidem tartrate; bacitracin; moviprep; zolpidem; and neomycin-bacitracin zn-polymyx.  MEDICATIONS:  Current Outpatient Prescriptions  Medication Sig Dispense Refill  . Calcium Carbonate (CALCIUM 600 PO) Take by mouth.      . estradiol (MINIVELLE) 0.0375 MG/24HR Place 1 patch onto the skin 2 (two) times a week. Sundays and Thursdays      . metFORMIN (GLUCOPHAGE) 1000 MG tablet Take 1,000 mg by mouth 2 (two) times daily with a meal.      . METHYLTESTOSTERONE PO Take 1 capsule by mouth daily.      . Multiple Vitamin (MULTIVITAMIN) tablet Take 1 tablet by mouth daily. Senior      . omeprazole  (PRILOSEC) 40 MG capsule Take 1 capsule (40 mg total) by mouth daily.  30 capsule  6  . Probiotic Product (SOLUBLE FIBER/PROBIOTICS PO) Take 1 tablet by mouth daily.      . sitaGLIPtin (JANUVIA) 100 MG tablet Take 100 mg by mouth daily.      Marland Kitchen oxyCODONE-acetaminophen (PERCOCET/ROXICET) 5-325 MG per tablet Take 1-2 tablets by mouth every 4 (four) hours as needed for moderate pain.  180 tablet  0   No current facility-administered medications for this visit.   Facility-Administered Medications Ordered in Other Visits  Medication Dose Route Frequency Provider Last Rate Last Dose  . sodium chloride 0.9 % injection 10 mL  10 mL Intravenous PRN Deatra Robinson, MD   10 mL at 04/25/13 1153    SURGICAL HISTORY:  Past Surgical History  Procedure Laterality Date  .  Tonsillectomy  1952  . Adenoidectomy  1952  . Wrist surgery  1957    left reduction  . Compression fraction  1985    T12  . Anterior cruciate ligament repair  1993    right Murphy  . Cosmetic surgery  1999-2007    holderness blepharoplasty facial rhytidectomy mastopexy abdominoplasty   . Dental implant    . Dilation and curettage of uterus  1977  . Total abdominal hysterectomy  2005    with cystocele repair   . Colonoscopy N/A 10/24/2012    Procedure: COLONOSCOPY;  Surgeon: Irene Shipper, MD;  Location: [facility];  Service: Endoscopy;  Laterality: N/A;  . Colonoscopy N/A 10/25/2012    Procedure: COLONOSCOPY;  Surgeon: Irene Shipper, MD;  Location: [facility];  Service: Endoscopy;  Laterality: N/A;  . Video bronchoscopy Bilateral 10/27/2012    Procedure: VIDEO BRONCHOSCOPY WITH FLUORO;  Surgeon: Chesley Mires, MD;  Location: [facility] ENDOSCOPY;  Service: Endoscopy;  Laterality: Bilateral;  . Portacath placement Left 11/21/2012    Procedure: INSERTION PORT-A-CATH;  Surgeon: Melrose Nakayama, MD;  Location: [facility];  Service: Thoracic;  Laterality: Left;  . Video assisted thoracoscopy Right 12/21/2012    Procedure: VIDEO ASSISTED  THORACOSCOPY;  Surgeon: Melrose Nakayama, MD;  Location: [facility];  Service: Thoracic;  Laterality: Right;  . Pleural effusion drainage Right 12/21/2012    Procedure: DRAINAGE OF PLEURAL EFFUSION;  Surgeon: Melrose Nakayama, MD;  Location: [place];  Service: Thoracic;  Laterality: Right;  . Decortication Right 12/21/2012    Procedure: DECORTICATION;  Surgeon: Melrose Nakayama, MD;  Location: [place];  Service: Thoracic;  Laterality: Right;  . Thorocotomy with lobectomy Right 02/27/2013    Procedure: THOROCOTOMY WITH BI- LOBECTOMY;  Surgeon: Melrose Nakayama, MD;  Location: [place];  Service: Thoracic;  Laterality: Right;    REVIEW OF SYSTEMS:  Pertinent items are noted in HPI.   HEALTH MAINTENANCE:   PHYSICAL EXAMINATION: Blood pressure 132/81, pulse 90, temperature 97.7 F (36.5 C), temperature source Oral, resp. rate 18, height 5\' 4"  (1.626 m), weight 118 lb (53.524 kg). Body mass index is 20.24 kg/(m^2). General: Patient is a well appearing female in no acute distress HEENT: PERRLA, sclerae anicteric no conjunctival pallor, MMM Neck: supple, no palpable adenopathy Lungs: clear to auscultation bilaterally, no wheezes, rhonchi, or rales Cardiovascular: regular rate rhythm, S1, S2, no murmurs, rubs or gallops Abdomen: Soft, non-tender, non-distended, normoactive bowel sounds, no HSM Extremities: warm and well perfused, no clubbing, cyanosis, or edema Skin: No rashes or lesions Neuro: Non-focal ECOG PERFORMANCE STATUS: 0 - Asymptomatic  LABORATORY DATA: Lab Results  Component Value Date   WBC 3.7* 04/25/2013   HGB 12.5 04/25/2013   HCT 37.1 04/25/2013   MCV 93.6 04/25/2013   PLT 312 04/25/2013      Chemistry      Component Value Date/Time   NA 138 04/25/2013 1008   NA 135 03/21/2013 1550   K 3.9 04/25/2013 1008   K 4.0 03/21/2013 1550   CL 97 03/21/2013 1550   CO2 26 04/25/2013 1008   CO2 31 03/21/2013 1550   BUN 9.5 04/25/2013 1008   BUN 10 03/21/2013 1550   CREATININE  0.6 04/25/2013 1008   CREATININE 0.3* 03/21/2013 1550      Component Value Date/Time   CALCIUM 9.4 04/25/2013 1008   CALCIUM 9.4 03/21/2013 1550   ALKPHOS 42 04/25/2013 1008   ALKPHOS 68 03/21/2013 1550   AST 12 04/25/2013 1008  AST 16 03/21/2013 1550   ALT 10 04/25/2013 1008   ALT 16 03/21/2013 1550   BILITOT 0.29 04/25/2013 1008   BILITOT 0.5 03/21/2013 1550       RADIOGRAPHIC STUDIES:  Dg Chest 2 View Within Previous 72 Hours.  Films Obtained On Friday Are Acceptable For Monday And Tuesday Cases  11/18/2012   CLINICAL DATA:  68 year old female preoperative study. Lung mass. Colon cancer.  EXAM: CHEST  2 VIEW  COMPARISON:  PET-CT 11/02/2012 and earlier.  FINDINGS: 5 cm right lower lobe lung mass re- identified. Radiographically this appears not significantly changed since 10/25/2012. The 6 mm cavitary right upper lobe pulmonary nodule is faint but visible. Stable lung volumes. Stable cardiac size and mediastinal contours. The left lung is clear. No pneumothorax or effusion. Stable visualized osseous structures. Chronic T12 compression fracture. Flowing thoracic osteophytes.  IMPRESSION: Known right lung lesions. No new cardiopulmonary abnormality.   Electronically Signed   By: Lars Pinks   On: 11/18/2012 15:53   Nm Pet Image Initial (pi) Skull Base To Thigh  11/02/2012   *RADIOLOGY REPORT*  Clinical Data:  Initial treatment strategy for rectal cancer. Circumferential rectal cancer 4 cm from the anal verge.  4 cm right lung mass.  NUCLEAR MEDICINE PET WHOLE BODY  Fasting Blood Glucose:  123  Technique:  19.1 mCi F-18 FDG was injected intravenously. CT data was obtained and used for attenuation correction and anatomic localization only.  (This was not acquired as a diagnostic CT examination.) Additional exam technical data entered on technologist worksheet.  Comparison:  CT scan from 10/25/2012  Findings:  Head/Neck:   No hypermetabolic lymph nodes in the neck.  Chest:  The previously characterized  5 cm right lower lobe pulmonary mass is hypermetabolic with SUV max = 8.  No evidence for hypermetabolic lymphadenopathy in the mediastinum or either hilum. No other hypermetabolic lung lesions are evident although the patient does have bilateral small pulmonary parenchymal nodules (see posterior right upper lobe on image 73 and posterior right lower lobe on image 78).  Both of these small nodules are below the accepted size threshold for reliable resolution on PET imaging.  Abdomen/Pelvis:  No abnormal hypermetabolism is identified within the liver.  No hypermetabolic lymphadenopathy in the abdomen.  There is a prominent amount of physiologic F D G uptake in the colon, but this is associated with hypermetabolic uptake in a region of circumferential rectal wall thickening.  SUV max = 9 for the rectal hypermetabolism.  Skeleton:  No focal hypermetabolic activity to suggest skeletal metastasis.  IMPRESSION: Hypermetabolism in the area of apparent circumferential rectal wall thickening associate with hypermetabolic 5 cm right lower lobe pulmonary mass.  6 mm cavitary pulmonary nodule the posterior right upper lobe is not hypermetabolic on PET imaging, but is below the accepted size threshold for reliable resolution on PET.  A second tiny right lower lobe nodule is also noted.   Original Report Authenticated By: Misty Stanley, M.D.   [facility] [place] Report  11/21/2012   CLINICAL DATA: Done in OR # 10,Intraoperative insertion port a cath   FLOURO GUIDE CV LINE  Fluoroscopy was utilized by the requesting physician.  No radiographic  interpretation.     ASSESSMENT/PLAN: 68 year old female with  #1 metastatic rectal carcinoma with metastasis to the lung. Patient initially underwent 4 cycles of FOLFOX 6. She then went on to have resection of the metastasis in the right lung. She has recovered from this very nicely.  #2  patient was seen by Dr. Vita Barley at [facility]. He has recommended the  possibility of doing the colon surgery first. We will try to coordinate this. Certainly we could after the fifth cycle of chemotherapy she is going to have a proctoscope performed at [facility]. After that she certainly could begin radiation therapy with radiosensitizing Xeloda. When she completes this she will proceed with her surgery. Once she recovers from it we certainly could get her started back on radiation therapy  #3 patient is now status post concurrent radiation and Xeloda completed 04/11/2013. Overall she tolerated it well.  #4 abdominal pain unclear etiology. We will continue to monitor this very closely. She is taking pain medications and a refill was given to her today. She does tell me if this helps. She is having regular bowel movements. He has no evidence of obstruction.  #5 left upper extremity pain: Secondary to possibly trauma from recent surgical procedure we will send her for physical therapy.  #6 protein malnutrition: Patient is encouraged to continue eating protein rich foods. We will also send it to our dietitian for further evaluation and management  #7 patient will be seen back in 3-4 weeks' time for followup.   All questions were answered. The patient knows to call the clinic with any problems, questions or concerns. We can certainly see the patient much sooner if necessary.  I spent 25 minutes counseling the patient face to face. The total time spent in the appointment was 30 minutes.  Marcy Panning, MD Medical/Oncology [facility] [phone] (beeper) [phone] (Office)

## 2013-04-25 NOTE — Patient Instructions (Signed)

## 2013-04-26 ENCOUNTER — Encounter: Payer: 59 | Admitting: Nutrition

## 2013-04-27 ENCOUNTER — Ambulatory Visit: Payer: 59 | Admitting: Nutrition

## 2013-04-27 NOTE — Progress Notes (Signed)
68 year old female diagnosed with metastatic rectal cancer.  She is a patient of Dr. Humphrey Rolls.  Past medical history includes hyperglycemia, hearing loss, diabetes, asthma, and colon cancer.  Medications include calcium, Glucophage, multivitamin, Prilosec, Januvia.  Labs include albumin 3.4, total protein 5.7.  Height: 64 inches. Weight: 118 pounds. Usual body weight 135-140 pounds per patient. BMI: 20.24.  Estimated nutrition needs: 1600-1800 calories, 75-85 g protein, 1.8 L fluid.  Patient is status post 5 cycles of FOLFOX and a lung mass resection.  She has tried ensure in the past, but it irritates her stomach.  She occasionally has abdominal cramping after eating.  Reports early satiety and poor appetite.  Has lost approximately 20 pounds since August.  Patient is scheduled for surgery on March 4, is interested in increasing calories and protein for repletion.  Dietary recall reveals patient tolerates eggs, oatmeal, and seafood.  She will drink milk.  Nutrition diagnosis: Unintended weight loss related to poor appetite and early satiety related to diagnosis of rectal cancer as evidenced by approximately 16% weight loss from usual body weight.  Intervention: Patient educated to increase calories and protein in small, frequent meals and snacks throughout the day.  Encouraged patient to consume oral nutrition supplements twice a day.  Educated patient on ways to add calories to foods she is tolerating.  Encouraged patient to keep a food Journal to document foods eaten and her reaction to them. Provided patient with samples of oral nutrition supplements, fact sheets, and coupons.  Questions were answered.  Teach back method used.  Monitoring, evaluation, goals: Patient will tolerate oral nutrition supplements twice a day to minimize further weight loss and promote repletion lean body mass.  Next visit: Patient has my contact information and will call me if she has questions or concerns

## 2013-04-28 ENCOUNTER — Telehealth: Payer: Self-pay | Admitting: *Deleted

## 2013-04-28 ENCOUNTER — Ambulatory Visit: Payer: 59 | Attending: Oncology | Admitting: Physical Therapy

## 2013-04-28 DIAGNOSIS — C20 Malignant neoplasm of rectum: Secondary | ICD-10-CM | POA: Insufficient documentation

## 2013-04-28 DIAGNOSIS — IMO0001 Reserved for inherently not codable concepts without codable children: Secondary | ICD-10-CM | POA: Insufficient documentation

## 2013-04-28 DIAGNOSIS — M6281 Muscle weakness (generalized): Secondary | ICD-10-CM | POA: Insufficient documentation

## 2013-04-28 DIAGNOSIS — R5381 Other malaise: Secondary | ICD-10-CM | POA: Insufficient documentation

## 2013-04-28 DIAGNOSIS — M25519 Pain in unspecified shoulder: Secondary | ICD-10-CM | POA: Insufficient documentation

## 2013-04-28 DIAGNOSIS — C7800 Secondary malignant neoplasm of unspecified lung: Secondary | ICD-10-CM | POA: Insufficient documentation

## 2013-04-28 NOTE — Telephone Encounter (Signed)
Received call from Maudry Diego [facility] stating that pt is there & has deltoid bursitis & she wants to administer ionophoresis with dexamethasone 120 mg but needs an OK & order from Dr Humphrey Rolls.  Will discuss with Dr Humphrey Rolls.  Pt is there now.  Call back # is [phone].

## 2013-05-02 ENCOUNTER — Encounter: Payer: Self-pay | Admitting: Oncology

## 2013-05-02 NOTE — Progress Notes (Signed)
FAXED 8 PAGES TO Tobey Bride, RN CASE MANAGER @ [facility] # [phone] PHONE # [phone] EXT [phone].

## 2013-05-08 ENCOUNTER — Ambulatory Visit: Payer: 59 | Attending: Oncology | Admitting: Physical Therapy

## 2013-05-08 DIAGNOSIS — IMO0001 Reserved for inherently not codable concepts without codable children: Secondary | ICD-10-CM | POA: Insufficient documentation

## 2013-05-08 DIAGNOSIS — C7800 Secondary malignant neoplasm of unspecified lung: Secondary | ICD-10-CM | POA: Insufficient documentation

## 2013-05-08 DIAGNOSIS — M6281 Muscle weakness (generalized): Secondary | ICD-10-CM | POA: Insufficient documentation

## 2013-05-08 DIAGNOSIS — R5381 Other malaise: Secondary | ICD-10-CM | POA: Insufficient documentation

## 2013-05-08 DIAGNOSIS — M25519 Pain in unspecified shoulder: Secondary | ICD-10-CM | POA: Insufficient documentation

## 2013-05-08 DIAGNOSIS — C20 Malignant neoplasm of rectum: Secondary | ICD-10-CM | POA: Insufficient documentation

## 2013-05-11 ENCOUNTER — Other Ambulatory Visit: Payer: Self-pay | Admitting: *Deleted

## 2013-05-11 ENCOUNTER — Ambulatory Visit
Admission: RE | Admit: 2013-05-11 | Discharge: 2013-05-11 | Disposition: A | Discharge status: Home / Self Care | Payer: 59 | Source: Ambulatory Visit | Attending: Radiation Oncology | Admitting: Radiation Oncology

## 2013-05-11 ENCOUNTER — Other Ambulatory Visit: Payer: Self-pay

## 2013-05-11 ENCOUNTER — Encounter: Payer: Self-pay | Admitting: Radiation Oncology

## 2013-05-11 ENCOUNTER — Ambulatory Visit (HOSPITAL_BASED_OUTPATIENT_CLINIC_OR_DEPARTMENT_OTHER): Payer: 59 | Admitting: Oncology

## 2013-05-11 VITALS — BP 168/91 | HR 92 | Temp 97.7°F | Resp 18 | Ht 64.0 in | Wt 117.0 lb

## 2013-05-11 VITALS — BP 172/90 | HR 91 | Temp 98.0°F | Resp 16 | Wt 117.0 lb

## 2013-05-11 DIAGNOSIS — C7800 Secondary malignant neoplasm of unspecified lung: Principal | ICD-10-CM

## 2013-05-11 DIAGNOSIS — C20 Malignant neoplasm of rectum: Secondary | ICD-10-CM

## 2013-05-11 DIAGNOSIS — G8918 Other acute postprocedural pain: Secondary | ICD-10-CM

## 2013-05-11 DIAGNOSIS — R198 Other specified symptoms and signs involving the digestive system and abdomen: Secondary | ICD-10-CM

## 2013-05-11 DIAGNOSIS — R109 Unspecified abdominal pain: Secondary | ICD-10-CM

## 2013-05-11 MED ORDER — OXYCODONE-ACETAMINOPHEN 5-325 MG PO TABS: 1.0000 | ORAL_TABLET | ORAL | Status: DC | PRN

## 2013-05-11 NOTE — Progress Notes (Signed)
Patient was seen for a quick visit today. Overall she seems to be doing well. She is beginning to keep the whole lot better. And seems to have maintained her weight by our scale. However she does tell me that she has gained about 4 pounds by her scale. She was in [place] for about a week and enjoyed herself lot.  She still continues to have the lower abdominal pain off and on. She does tell me that it is not as persistent as it used to be. She continues to have frequent small bowel movements with some diarrhea. She also notices tissue in the stools as well. She's denying having any nausea or vomiting. She has no fevers chills or night sweats no peripheral paresthesias. We did discuss the possibility of referring the patient to gastroenterology for possible GI workup including getting a HIDA scan. At this time Dr. [name] does not think that that is necessary.  Patient was given a prescription for her pain medication. She is scheduled to see me again in 2-3 weeks' time.  Marcy Panning, MD Medical/Oncology [facility] [phone] (beeper) [phone] (Office)  05/11/2013, 11:47 AM

## 2013-05-11 NOTE — Progress Notes (Signed)
  Radiation Oncology         [phone] ________________________________  Name: Anne [name] MRN: [id number]  Date: 05/11/2013  DOB: 13-May-1945  Follow-Up Visit Note  CC: [name], MD  Deatra Robinson, MD  Diagnosis:   68 yo woman with rectal cancer rectal cancer and a solitary lung metastasis s/p pre-op pelvic chemoradiotherapy 02/28/2013-04/11/2013 to 45 Gy in 25 fractions with simultaneous integrated tumor boost to 50 Gy at 2 Gy per fraction  Interval Since Last Radiation:  4  weeks  Narrative:  The patient returns today for routine follow-up.  Surgery scheduled for 3/4. Weight stable despite continued poor appetite. Reports semi formed to loose stool. Reports frequent episodes of gas with passing of small amounts of stool. Reports nausea continues but, has slight improved. Denies vomiting. Reports taste is slowly returning. Reports low abdominal pain that comes in waves. She reports low abdominal pain 6 on a scale of 0-10. Reports taking pain medication 3-4 times per day to relieve this pain. Reports energy hasn't returned to normal but, is slowly improving. Denies pain or sensitivity at rectum. Reports skin of anterior low abdomen/upper pelvis is hyperpigmented, dry, itchy and flaky                             ALLERGIES:  is allergic to ativan; midazolam; zolpidem tartrate; bacitracin; moviprep; zolpidem; and neomycin-bacitracin zn-polymyx.  Meds: Current Outpatient Prescriptions  Medication Sig Dispense Refill  . Calcium Carbonate (CALCIUM 600 PO) Take by mouth.      . estradiol (MINIVELLE) 0.0375 MG/24HR Place 1 patch onto the skin 2 (two) times a week. Sundays and Thursdays      . metFORMIN (GLUCOPHAGE) 1000 MG tablet Take 1,000 mg by mouth 2 (two) times daily with a meal.      . METHYLTESTOSTERONE PO Take 1 capsule by mouth daily.      . Multiple Vitamin (MULTIVITAMIN) tablet Take 1 tablet by mouth daily. Senior      . omeprazole (PRILOSEC) 40 MG capsule Take 1  capsule (40 mg total) by mouth daily.  30 capsule  6  . oxyCODONE-acetaminophen (PERCOCET/ROXICET) 5-325 MG per tablet Take 1-2 tablets by mouth every 4 (four) hours as needed for moderate pain.  180 tablet  0  . Probiotic Product (SOLUBLE FIBER/PROBIOTICS PO) Take 1 tablet by mouth daily.      . sitaGLIPtin (JANUVIA) 100 MG tablet Take 100 mg by mouth daily.       No current facility-administered medications for this encounter.    Physical Findings: The patient is in no acute distress. Patient is alert and oriented.  She looks great and is in better spirits.  weight is 117 lb (53.071 kg). Her oral temperature is 98 F (36.7 C). Her blood pressure is 172/90 and her pulse is 91. Her respiration is 16 and oxygen saturation is 97%. .  No significant changes.  Lab Findings: Lab Results  Component Value Date   WBC 3.7* 04/25/2013   HGB 12.5 04/25/2013   HCT 37.1 04/25/2013   MCV 93.6 04/25/2013   PLT 312 04/25/2013    Impression:  The patient is recovering from the effects of radiation.    Plan:  Surgery in early March.  I will look forward to seeing her back in 6-8 weeks depending on her recovery.  She plans to call when she is ready to return.  _____________________________________  Sheral Apley. Tammi Klippel, M.D.

## 2013-05-11 NOTE — Progress Notes (Signed)
Surgery scheduled for 3/3. Weight stable despite continued poor appetite. Reports semi formed to loose stool the diameter of a finger and broken. Reports frequent episodes of gas with passing of small amounts of stool. Reports nausea continues but, has slight improved. Denies vomiting. Reports taste is slowly returning. Reports low abdominal pain that comes in waves. She reports low abdominal pain 6 on a scale of 0-10. Reports taking pain medication 3-4 times per day to relieve this pain. Reports energy hasn't returned to normal but, is slowly improving. Denies pain or sensitivity at rectum. Reports skin of anterior low abdomen/upper pelvis is hyperpigmented, dry, itchy and flaky.

## 2013-05-12 ENCOUNTER — Telehealth: Payer: Self-pay | Admitting: *Deleted

## 2013-05-12 ENCOUNTER — Emergency Department (HOSPITAL_COMMUNITY): Payer: 59

## 2013-05-12 ENCOUNTER — Encounter (HOSPITAL_COMMUNITY): Payer: Self-pay | Admitting: Emergency Medicine

## 2013-05-12 ENCOUNTER — Emergency Department (HOSPITAL_COMMUNITY)
Admission: EM | Admit: 2013-05-12 | Discharge: 2013-05-12 | Disposition: A | Discharge status: Home / Self Care | Payer: 59 | Attending: Emergency Medicine | Admitting: Emergency Medicine

## 2013-05-12 ENCOUNTER — Ambulatory Visit: Payer: 59 | Admitting: Physical Therapy

## 2013-05-12 DIAGNOSIS — Z8669 Personal history of other diseases of the nervous system and sense organs: Secondary | ICD-10-CM | POA: Insufficient documentation

## 2013-05-12 DIAGNOSIS — J45909 Unspecified asthma, uncomplicated: Secondary | ICD-10-CM | POA: Insufficient documentation

## 2013-05-12 DIAGNOSIS — R110 Nausea: Secondary | ICD-10-CM | POA: Insufficient documentation

## 2013-05-12 DIAGNOSIS — R1032 Left lower quadrant pain: Secondary | ICD-10-CM | POA: Insufficient documentation

## 2013-05-12 DIAGNOSIS — Z860100 Personal history of colon polyps, unspecified: Secondary | ICD-10-CM | POA: Insufficient documentation

## 2013-05-12 DIAGNOSIS — Z85038 Personal history of other malignant neoplasm of large intestine: Secondary | ICD-10-CM | POA: Insufficient documentation

## 2013-05-12 DIAGNOSIS — R011 Cardiac murmur, unspecified: Secondary | ICD-10-CM | POA: Insufficient documentation

## 2013-05-12 DIAGNOSIS — Z79899 Other long term (current) drug therapy: Secondary | ICD-10-CM | POA: Insufficient documentation

## 2013-05-12 DIAGNOSIS — Z87442 Personal history of urinary calculi: Secondary | ICD-10-CM | POA: Insufficient documentation

## 2013-05-12 DIAGNOSIS — R197 Diarrhea, unspecified: Secondary | ICD-10-CM | POA: Insufficient documentation

## 2013-05-12 DIAGNOSIS — R109 Unspecified abdominal pain: Secondary | ICD-10-CM

## 2013-05-12 DIAGNOSIS — E119 Type 2 diabetes mellitus without complications: Secondary | ICD-10-CM | POA: Insufficient documentation

## 2013-05-12 DIAGNOSIS — Z8781 Personal history of (healed) traumatic fracture: Secondary | ICD-10-CM | POA: Insufficient documentation

## 2013-05-12 DIAGNOSIS — Z85828 Personal history of other malignant neoplasm of skin: Secondary | ICD-10-CM | POA: Insufficient documentation

## 2013-05-12 LAB — CBC WITH DIFFERENTIAL/PLATELET
BASOS ABS: 0 10*3/uL (ref 0.0–0.1)
BASOS PCT: 0 % (ref 0–1)
Eosinophils Absolute: 0 10*3/uL (ref 0.0–0.7)
Eosinophils Relative: 0 % (ref 0–5)
HCT: 38.9 % (ref 36.0–46.0)
HEMOGLOBIN: 13.4 g/dL (ref 12.0–15.0)
LYMPHS PCT: 4 % — AB (ref 12–46)
Lymphs Abs: 0.6 10*3/uL — ABNORMAL LOW (ref 0.7–4.0)
MCH: 32.2 pg (ref 26.0–34.0)
MCHC: 34.4 g/dL (ref 30.0–36.0)
MCV: 93.5 fL (ref 78.0–100.0)
MONOS PCT: 5 % (ref 3–12)
Monocytes Absolute: 0.7 10*3/uL (ref 0.1–1.0)
Neutro Abs: 12.7 10*3/uL — ABNORMAL HIGH (ref 1.7–7.7)
Neutrophils Relative %: 91 % — ABNORMAL HIGH (ref 43–77)
Platelets: 342 10*3/uL (ref 150–400)
RBC: 4.16 MIL/uL (ref 3.87–5.11)
RDW: 16.5 % — ABNORMAL HIGH (ref 11.5–15.5)
WBC: 14 10*3/uL — AB (ref 4.0–10.5)

## 2013-05-12 LAB — URINALYSIS, ROUTINE W REFLEX MICROSCOPIC
Glucose, UA: NEGATIVE mg/dL
HGB URINE DIPSTICK: NEGATIVE
Ketones, ur: 15 mg/dL — AB
Nitrite: NEGATIVE
PH: 6 (ref 5.0–8.0)
Protein, ur: NEGATIVE mg/dL
Specific Gravity, Urine: 1.028 (ref 1.005–1.030)
Urobilinogen, UA: 1 mg/dL (ref 0.0–1.0)

## 2013-05-12 LAB — URINE MICROSCOPIC-ADD ON

## 2013-05-12 LAB — COMPREHENSIVE METABOLIC PANEL
ALBUMIN: 3.5 g/dL (ref 3.5–5.2)
ALK PHOS: 46 U/L (ref 39–117)
ALT: 10 U/L (ref 0–35)
AST: 17 U/L (ref 0–37)
BILIRUBIN TOTAL: 0.4 mg/dL (ref 0.3–1.2)
BUN: 14 mg/dL (ref 6–23)
CHLORIDE: 97 meq/L (ref 96–112)
CO2: 18 mEq/L — ABNORMAL LOW (ref 19–32)
Calcium: 9.3 mg/dL (ref 8.4–10.5)
Creatinine, Ser: 0.37 mg/dL — ABNORMAL LOW (ref 0.50–1.10)
GFR calc Af Amer: >90 mL/min (ref >90)
GFR calc non Af Amer: >90 mL/min (ref >90)
GLUCOSE: 118 mg/dL — AB (ref 70–99)
POTASSIUM: 4 meq/L (ref 3.7–5.3)
SODIUM: 140 meq/L (ref 137–147)
Total Protein: 6.2 g/dL (ref 6.0–8.3)

## 2013-05-12 LAB — LIPASE, BLOOD: LIPASE: 21 U/L (ref 11–59)

## 2013-05-12 MED ORDER — IOHEXOL 300 MG/ML  SOLN
100.0000 mL | Freq: Once | INTRAMUSCULAR | Status: AC | PRN
  Administered 2013-05-12: 100 mL via INTRAVENOUS

## 2013-05-12 MED ORDER — IOHEXOL 300 MG/ML  SOLN
50.0000 mL | Freq: Once | INTRAMUSCULAR | Status: AC | PRN
  Administered 2013-05-12: 50 mL via ORAL

## 2013-05-12 MED ORDER — OXYCODONE HCL 5 MG PO TABS: 5.0000 mg | ORAL_TABLET | ORAL | Status: DC | PRN

## 2013-05-12 MED ORDER — ONDANSETRON HCL 4 MG/2ML IJ SOLN
4.0000 mg | Freq: Once | INTRAMUSCULAR | Status: AC
  Administered 2013-05-12: 4 mg via INTRAVENOUS
  Filled 2013-05-12: qty 2

## 2013-05-12 MED ORDER — HEPARIN SOD (PORK) LOCK FLUSH 100 UNIT/ML IV SOLN
500.0000 [IU] | INTRAVENOUS | Status: AC | PRN
  Administered 2013-05-12: 500 [IU]
  Filled 2013-05-12: qty 5

## 2013-05-12 MED ORDER — SODIUM CHLORIDE 0.9 % IV BOLUS (SEPSIS)
1000.0000 mL | INTRAVENOUS | Status: AC
  Administered 2013-05-12: 1000 mL via INTRAVENOUS

## 2013-05-12 MED ORDER — HYDROMORPHONE HCL PF 1 MG/ML IJ SOLN
0.5000 mg | INTRAMUSCULAR | Status: AC
  Administered 2013-05-12: 0.5 mg via INTRAVENOUS
  Filled 2013-05-12: qty 1

## 2013-05-12 NOTE — Discharge Instructions (Signed)
Abdominal Pain, Women °Abdominal (stomach, pelvic, or belly) pain can be caused by many things. It is important to tell your doctor: °· The location of the pain. °· Does it come and go or is it present all the time? °· Are there things that start the pain (eating certain foods, exercise)? °· Are there other symptoms associated with the pain (fever, nausea, vomiting, diarrhea)? °All of this is helpful to know when trying to find the cause of the pain. °CAUSES  °· Stomach: virus or bacteria infection, or ulcer. °· Intestine: appendicitis (inflamed appendix), regional ileitis (Crohn's disease), ulcerative colitis (inflamed colon), irritable bowel syndrome, diverticulitis (inflamed diverticulum of the colon), or cancer of the stomach or intestine. °· Gallbladder disease or stones in the gallbladder. °· Kidney disease, kidney stones, or infection. °· Pancreas infection or cancer. °· Fibromyalgia (pain disorder). °· Diseases of the female organs: °· Uterus: fibroid (non-cancerous) tumors or infection. °· Fallopian tubes: infection or tubal pregnancy. °· Ovary: cysts or tumors. °· Pelvic adhesions (scar tissue). °· Endometriosis (uterus lining tissue growing in the pelvis and on the pelvic organs). °· Pelvic congestion syndrome (female organs filling up with blood just before the menstrual period). °· Pain with the menstrual period. °· Pain with ovulation (producing an egg). °· Pain with an IUD (intrauterine device, birth control) in the uterus. °· Cancer of the female organs. °· Functional pain (pain not caused by a disease, may improve without treatment). °· Psychological pain. °· Depression. °DIAGNOSIS  °Your doctor will decide the seriousness of your pain by doing an examination. °· Blood tests. °· X-rays. °· Ultrasound. °· CT scan (computed tomography, special type of X-ray). °· MRI (magnetic resonance imaging). °· Cultures, for infection. °· Barium enema (dye inserted in the large intestine, to better view it with  X-rays). °· Colonoscopy (looking in intestine with a lighted tube). °· Laparoscopy (minor surgery, looking in abdomen with a lighted tube). °· Major abdominal exploratory surgery (looking in abdomen with a large incision). °TREATMENT  °The treatment will depend on the cause of the pain.  °· Many cases can be observed and treated at home. °· Over-the-counter medicines recommended by your caregiver. °· Prescription medicine. °· Antibiotics, for infection. °· Birth control pills, for painful periods or for ovulation pain. °· Hormone treatment, for endometriosis. °· Nerve blocking injections. °· Physical therapy. °· Antidepressants. °· Counseling with a psychologist or psychiatrist. °· Minor or major surgery. °HOME CARE INSTRUCTIONS  °· Do not take laxatives, unless directed by your caregiver. °· Take over-the-counter pain medicine only if ordered by your caregiver. Do not take aspirin because it can cause an upset stomach or bleeding. °· Try a clear liquid diet (broth or water) as ordered by your caregiver. Slowly move to a bland diet, as tolerated, if the pain is related to the stomach or intestine. °· Have a thermometer and take your temperature several times a day, and record it. °· Bed rest and sleep, if it helps the pain. °· Avoid sexual intercourse, if it causes pain. °· Avoid stressful situations. °· Keep your follow-up appointments and tests, as your caregiver orders. °· If the pain does not go away with medicine or surgery, you may try: °· Acupuncture. °· Relaxation exercises (yoga, meditation). °· Group therapy. °· Counseling. °SEEK MEDICAL CARE IF:  °· You notice certain foods cause stomach pain. °· Your home care treatment is not helping your pain. °· You need stronger pain medicine. °· You want your IUD removed. °· You feel faint or   lightheaded. °· You develop nausea and vomiting. °· You develop a rash. °· You are having side effects or an allergy to your medicine. °SEEK IMMEDIATE MEDICAL CARE IF:  °· Your  pain does not go away or gets worse. °· You have a fever. °· Your pain is felt only in portions of the abdomen. The right side could possibly be appendicitis. The left lower portion of the abdomen could be colitis or diverticulitis. °· You are passing blood in your stools (bright red or black tarry stools, with or without vomiting). °· You have blood in your urine. °· You develop chills, with or without a fever. °· You pass out. °MAKE SURE YOU:  °· Understand these instructions. °· Will watch your condition. °· Will get help right away if you are not doing well or get worse. °Document Released: 01/11/2007 Document Revised: 06/08/2011 Document Reviewed: 01/31/2009 °[employer]® Patient Information ©2014 [employer]. ° °

## 2013-05-12 NOTE — Telephone Encounter (Signed)
PT. WENT TO BREAKFAST WITH HER FAMILY. AFTER BREAKFAST SHE BEGAN HAVING SEVERE ABDOMINAL PAIN AND NAUSEA WHICH HAS LASTED TWO HOURS. PT. HAD TAKEN A COMPAZINE BEFORE BREAKFAST AND HAS TAKEN THREE OXYCODONE WITH NO RELIEF. NO VOMITING BUT IS SPITTING UP SOME THICK MUCUS. PT. HAS PASSED SOME STOOL AND TISSUE WHICH IS NORMAL FOR PT. BUT NO BLOOD. SPOKE WITH DR.KHAN'S NURSE, STACEY CAMP,RN. PT. NEEDS TO GO TO THE EMERGENCY ROOM FOR EVALUATION. SPOKE TO PT. SHE STATES HER ABDOMEN IS DISTENDED. INFORMED PT. TO GO TO THE [facility] EMERGENCY ROOM TO BE EVALUATED.

## 2013-05-12 NOTE — ED Notes (Signed)
CT tech here to access pt port

## 2013-05-12 NOTE — ED Notes (Signed)
Has taken 3 5-325 Percocet around 0915/1030

## 2013-05-12 NOTE — ED Notes (Signed)
Pt notified that urine sample is needed

## 2013-05-12 NOTE — ED Provider Notes (Signed)
CSN: [id number]     Arrival date & time 05/12/13  1151 History   First MD Initiated Contact with Patient 05/12/13 1218     Chief Complaint  Patient presents with  . Abdominal Pain     (Consider location/radiation/quality/duration/timing/severity/associated sxs/prior Treatment) Patient is a 68 y.o. female presenting with abdominal pain. The history is provided by the patient.  Abdominal Pain Pain location:  LLQ Pain quality: aching   Pain radiates to:  Does not radiate Pain severity:  Moderate Onset quality:  Sudden Duration:  2 hours Timing:  Constant Progression:  Unchanged Chronicity:  New Context comment:  After eating Relieved by:  Nothing Worsened by:  Nothing tried Ineffective treatments: percocet x 3. Associated symptoms: diarrhea (no change from normal) and nausea   Associated symptoms: no chest pain, no cough, no dysuria, no fatigue, no fever, no hematuria, no shortness of breath and no vomiting     Past Medical History  Diagnosis Date  . Postmenopausal HRT (hormone replacement therapy) 07/01/2011    surgical menopause  . Hx of hyperglycemia 07/01/2011  . Hx of compression fracture of spine 1985    t12 after a  20 foot fall  . Hx of fracture of wrist     after fall   . 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 Hx of colonic polyps   . Early cataract     stoneburner   . High frequency hearing loss     [facility] followed   . Diabetes mellitus without complication   . Complication of anesthesia     pt hadpanic attacks with versed, no versed  . Status post chemotherapy     completed a total of three cycles of Folfox   . Colon cancer     rectal cancer with lung metastasis  . Skin cancer     multiple  . Heart murmur, systolic 09/05/4852    innocent per Dr. Gershon Mussel   . Asthma     as a child none since 1 years old  . OEVOJJKK(938.1)    Past Surgical History  Procedure Laterality Date  . Tonsillectomy  1952  . Adenoidectomy  1952  . Wrist surgery  1957    left  reduction  . Compression fraction  1985    T12  . Anterior cruciate ligament repair  1993    right Murphy  . Cosmetic surgery  1999-2007    holderness blepharoplasty facial rhytidectomy mastopexy abdominoplasty   . Dental implant    . Dilation and curettage of uterus  1977  . Total abdominal hysterectomy  2005    with cystocele repair   . Colonoscopy N/A 10/24/2012    Procedure: COLONOSCOPY;  Surgeon: Irene Shipper, MD;  Location: [place];  Service: Endoscopy;  Laterality: N/A;  . Colonoscopy N/A 10/25/2012    Procedure: COLONOSCOPY;  Surgeon: Irene Shipper, MD;  Location: [place];  Service: Endoscopy;  Laterality: N/A;  . Video bronchoscopy Bilateral 10/27/2012    Procedure: VIDEO BRONCHOSCOPY WITH FLUORO;  Surgeon: Chesley Mires, MD;  Location: [facility] ENDOSCOPY;  Service: Endoscopy;  Laterality: Bilateral;  . Portacath placement Left 11/21/2012    Procedure: INSERTION PORT-A-CATH;  Surgeon: Melrose Nakayama, MD;  Location: [place];  Service: Thoracic;  Laterality: Left;  . Video assisted thoracoscopy Right 12/21/2012    Procedure: VIDEO ASSISTED THORACOSCOPY;  Surgeon: Melrose Nakayama, MD;  Location: [place];  Service: Thoracic;  Laterality: Right;  . Pleural effusion drainage Right 12/21/2012  Procedure: DRAINAGE OF PLEURAL EFFUSION;  Surgeon: Melrose Nakayama, MD;  Location: [place];  Service: Thoracic;  Laterality: Right;  . Decortication Right 12/21/2012    Procedure: DECORTICATION;  Surgeon: Melrose Nakayama, MD;  Location: [place];  Service: Thoracic;  Laterality: Right;  . Thorocotomy with lobectomy Right 02/27/2013    Procedure: THOROCOTOMY WITH BI- LOBECTOMY;  Surgeon: Melrose Nakayama, MD;  Location: [place];  Service: Thoracic;  Laterality: Right;   Family History  Problem Relation Age of Onset  . Diabetes Father   . Cancer Father     oral/squamous cell  . Thyroid disease Father   . Heart attack Father     died age 71  . Dementia Mother   . Hypertension Mother    . Hyperlipidemia Mother   . Obesity Mother   . Cancer Brother     retro lymphosarcoma agent orange  . Cancer Sister     clear cell ovarina and breast insitu  . Diabetes Brother   . Obesity Brother   . Hypertension Sister   . Asthma Sister    History  Substance Use Topics  . Smoking status: Never Smoker   . Smokeless tobacco: Never Used  . Alcohol Use: 1.0 oz/week    2 drink(s) per week     Comment: occ   OB History   Grav Para Term Preterm Abortions TAB SAB Ect Mult Living                 Review of Systems  Constitutional: Negative for fever and fatigue.  HENT: Negative for congestion and drooling.   Eyes: Negative for pain.  Respiratory: Negative for cough and shortness of breath.   Cardiovascular: Negative for chest pain.  Gastrointestinal: Positive for nausea, abdominal pain and diarrhea (no change from normal). Negative for vomiting.  Genitourinary: Negative for dysuria and hematuria.  Musculoskeletal: Negative for back pain, gait problem and neck pain.  Skin: Negative for color change.  Neurological: Negative for dizziness and headaches.  Hematological: Negative for adenopathy.  Psychiatric/Behavioral: Negative for behavioral problems.  All other systems reviewed and are negative.      Allergies  Ativan; Midazolam; Zolpidem tartrate; Bacitracin; Moviprep; Zolpidem; and Neomycin-bacitracin zn-polymyx  Home Medications   Current Outpatient Rx  Name  Route  Sig  Dispense  Refill  . Calcium Carbonate (CALCIUM 600 PO)   Oral   Take by mouth.         . estradiol (MINIVELLE) 0.0375 MG/24HR   Transdermal   Place 1 patch onto the skin 2 (two) times a week. Sundays and Thursdays         . metFORMIN (GLUCOPHAGE) 1000 MG tablet   Oral   Take 1,000 mg by mouth 2 (two) times daily with a meal.         . METHYLTESTOSTERONE PO   Oral   Take 1 capsule by mouth daily.         . Multiple Vitamin (MULTIVITAMIN) tablet   Oral   Take 1 tablet by mouth  daily. Senior         . omeprazole (PRILOSEC) 40 MG capsule   Oral   Take 1 capsule (40 mg total) by mouth daily.   30 capsule   6   . oxyCODONE-acetaminophen (PERCOCET/ROXICET) 5-325 MG per tablet   Oral   Take 1-2 tablets by mouth every 4 (four) hours as needed for moderate pain.   180 tablet   0   . Probiotic Product (  SOLUBLE FIBER/PROBIOTICS PO)   Oral   Take 1 tablet by mouth daily.         . sitaGLIPtin (JANUVIA) 100 MG tablet   Oral   Take 100 mg by mouth daily.          BP 149/81  Pulse 103  Temp(Src) 97.5 F (36.4 C) (Oral)  Resp 20  SpO2 100% Physical Exam  Nursing note and vitals reviewed. Constitutional: She is oriented to person, place, and time. She appears well-developed and well-nourished.  HENT:  Head: Normocephalic.  Mouth/Throat: Oropharynx is clear and moist. No oropharyngeal exudate.  Eyes: Conjunctivae and EOM are normal. Pupils are equal, round, and reactive to light.  Neck: Normal range of motion. Neck supple.  Cardiovascular: Regular rhythm, normal heart sounds and intact distal pulses.  Exam reveals no gallop and no friction rub.   No murmur heard. HR 103  Pulmonary/Chest: Effort normal and breath sounds normal. No respiratory distress. She has no wheezes.  Abdominal: Soft. Bowel sounds are normal. There is tenderness (mild to mod ttp of LLQ). There is no rebound and no guarding.  Musculoskeletal: Normal range of motion. She exhibits no edema and no tenderness.  Neurological: She is alert and oriented to person, place, and time.  Skin: Skin is warm and dry.  Psychiatric: She has a normal mood and affect. Her behavior is normal.    ED Course  Procedures (including critical care time) Labs Review Labs Reviewed  CBC WITH DIFFERENTIAL - Abnormal; Notable for the following:    WBC 14.0 (*)    RDW 16.5 (*)    Neutrophils Relative % 91 (*)    Lymphocytes Relative 4 (*)    Neutro Abs 12.7 (*)    Lymphs Abs 0.6 (*)    All other  components within normal limits  COMPREHENSIVE METABOLIC PANEL - Abnormal; Notable for the following:    CO2 18 (*)    Glucose, Bld 118 (*)    Creatinine, Ser 0.37 (*)    All other components within normal limits  URINALYSIS, ROUTINE W REFLEX MICROSCOPIC - Abnormal; Notable for the following:    Color, Urine AMBER (*)    Bilirubin Urine MODERATE (*)    Ketones, ur 15 (*)    Leukocytes, UA SMALL (*)    All other components within normal limits  LIPASE, BLOOD  URINE MICROSCOPIC-ADD ON   Imaging Review Ct Abdomen Pelvis W Contrast  05/12/2013   CLINICAL DATA:  Left lower abdominal pain.  History of colon cancer.  EXAM: CT ABDOMEN AND PELVIS WITH CONTRAST  TECHNIQUE: Multidetector CT imaging of the abdomen and pelvis was performed using the standard protocol following bolus administration of intravenous contrast.  CONTRAST:  31mL OMNIPAQUE IOHEXOL 300 MG/ML SOLN, 179mL OMNIPAQUE IOHEXOL 300 MG/ML SOLN  COMPARISON:  01/28/2013  FINDINGS: Metastatic nodules in the lung have increased. Metastatic nodule in the posterior aspect of the right upper lobe on image number 3 of series 7 measures 11 mm, 5 mm nodule on image 7 of series 7 in the left lower lobe, and 4 mm nodule in the left lower lobe on image 9 of series 7. There is a chronic loculated right base pneumothorax.  There is extensive mucosal thickening of the sigmoid portion of the colon. There is stool throughout the entire colon. There is some stool in the rectum. There is an area just above the rectum which demonstrates narrowing which may be purely a contraction.  The liver, biliary tree, spleen, pancreas, adrenal  glands, and kidneys demonstrate no significant abnormalities. Small bowel is not distended. No acute osseous abnormality.  IMPRESSION: Progressive metastatic lesions in the lungs. Chronic loculated right base pneumothorax. Diffuse mucosal thickening of the sigmoid portion of the colon as described.   Electronically Signed   By: Rozetta Nunnery M.D.   On: 05/12/2013 14:51    EKG Interpretation   None       MDM   Final diagnoses:  Abdominal pain    12:29 PM 69 y.o. female with a history of colorectal cancer who presents with left lower quadrant pain which began approximately one to 2 hours ago. She is status post chemotherapy and radiation with last tx approximately one month ago. She states that she has otherwise been well. She does note that she has multiple liquid stools daily which is unchanged. She notes nausea but no vomiting. She is afebrile and mildly tachycardic here. She is tender to palpation in the left lower quadrant on exam. Will get screening labwork and CT of abdomen. Dilaudid for pain control.  3:41 PM: Leukocytosis and CO2 of 18 on labs which are otherwise non-contrib. CT discussed w/ pt. No acute findings on imaging. Pt feeling better and would like to go home. Given her well appearance and resolving sx I think this is reasonable. Will give roxicet for breakthrough pain. I have discussed the diagnosis/risks/treatment options with the patient and family and believe the pt to be eligible for discharge home to follow-up with her oncologist. We also discussed returning to the ED immediately if new or worsening sx occur. We discussed the sx which are most concerning (e.g., worsening pain, fever, vomiting, worsening diarrhea, bloody stools) that necessitate immediate return. Medications administered to the patient during their visit and any new prescriptions provided to the patient are listed below.  Medications given during this visit Medications  HYDROmorphone (DILAUDID) injection 0.5 mg (0.5 mg Intravenous Given 05/12/13 1250)  sodium chloride 0.9 % bolus 1,000 mL (1,000 mLs Intravenous New Bag/Given 05/12/13 1254)  ondansetron (ZOFRAN) injection 4 mg (4 mg Intravenous Given 05/12/13 1250)  iohexol (OMNIPAQUE) 300 MG/ML solution 50 mL (50 mLs Oral Contrast Given 05/12/13 1243)  HYDROmorphone (DILAUDID) injection  0.5 mg (0.5 mg Intravenous Given 05/12/13 1409)  ondansetron (ZOFRAN) injection 4 mg (4 mg Intravenous Given 05/12/13 1411)  iohexol (OMNIPAQUE) 300 MG/ML solution 100 mL (100 mLs Intravenous Contrast Given 05/12/13 1418)    Discharge Medication List as of 05/12/2013  3:45 PM    START taking these medications   Details  oxyCODONE (ROXICODONE) 5 MG immediate release tablet Take 1 tablet (5 mg total) by mouth every 4 (four) hours as needed for breakthrough pain., Starting 05/12/2013, Until Discontinued, Print         Blanchard Kelch, MD 05/12/13 2231

## 2013-05-12 NOTE — ED Notes (Signed)
Pt sent by Dr. Humphrey Rolls for evaluation of left lower abd x 3-4 hours, has colon cancer. Has surgery in March to remove the cancer. Had BM toady.

## 2013-05-15 ENCOUNTER — Ambulatory Visit: Payer: 59 | Admitting: Physical Therapy

## 2013-05-18 ENCOUNTER — Ambulatory Visit: Payer: 59

## 2013-05-18 ENCOUNTER — Other Ambulatory Visit: Payer: Self-pay | Admitting: Dermatology

## 2013-05-18 ENCOUNTER — Other Ambulatory Visit: Payer: Self-pay | Admitting: *Deleted

## 2013-05-18 DIAGNOSIS — R918 Other nonspecific abnormal finding of lung field: Secondary | ICD-10-CM

## 2013-05-19 ENCOUNTER — Telehealth: Payer: Self-pay | Admitting: Oncology

## 2013-05-19 ENCOUNTER — Telehealth: Payer: Self-pay | Admitting: *Deleted

## 2013-05-19 ENCOUNTER — Other Ambulatory Visit: Payer: Self-pay | Admitting: Oncology

## 2013-05-19 ENCOUNTER — Encounter: Payer: Self-pay | Admitting: Oncology

## 2013-05-19 ENCOUNTER — Ambulatory Visit (HOSPITAL_BASED_OUTPATIENT_CLINIC_OR_DEPARTMENT_OTHER): Payer: 59 | Admitting: Oncology

## 2013-05-19 ENCOUNTER — Other Ambulatory Visit (HOSPITAL_BASED_OUTPATIENT_CLINIC_OR_DEPARTMENT_OTHER): Payer: 59

## 2013-05-19 VITALS — BP 153/82 | HR 75 | Temp 97.7°F | Resp 20 | Ht 64.0 in | Wt 114.7 lb

## 2013-05-19 DIAGNOSIS — C20 Malignant neoplasm of rectum: Secondary | ICD-10-CM

## 2013-05-19 DIAGNOSIS — E46 Unspecified protein-calorie malnutrition: Secondary | ICD-10-CM

## 2013-05-19 DIAGNOSIS — M79609 Pain in unspecified limb: Secondary | ICD-10-CM

## 2013-05-19 DIAGNOSIS — C7800 Secondary malignant neoplasm of unspecified lung: Secondary | ICD-10-CM

## 2013-05-19 DIAGNOSIS — R109 Unspecified abdominal pain: Secondary | ICD-10-CM

## 2013-05-19 LAB — CBC WITH DIFFERENTIAL/PLATELET
BASO%: 0.8 % (ref 0.0–2.0)
Basophils Absolute: 0 10*3/uL (ref 0.0–0.1)
EOS ABS: 0 10*3/uL (ref 0.0–0.5)
EOS%: 0.7 % (ref 0.0–7.0)
HCT: 38.5 % (ref 34.8–46.6)
HGB: 12.8 g/dL (ref 11.6–15.9)
LYMPH#: 0.4 10*3/uL — AB (ref 0.9–3.3)
LYMPH%: 15.4 % (ref 14.0–49.7)
MCH: 31.2 pg (ref 25.1–34.0)
MCHC: 33.2 g/dL (ref 31.5–36.0)
MCV: 94 fL (ref 79.5–101.0)
MONO#: 0.3 10*3/uL (ref 0.1–0.9)
MONO%: 11.2 % (ref 0.0–14.0)
NEUT%: 71.9 % (ref 38.4–76.8)
NEUTROS ABS: 1.9 10*3/uL (ref 1.5–6.5)
Platelets: 340 10*3/uL (ref 145–400)
RBC: 4.1 10*6/uL (ref 3.70–5.45)
RDW: 17.5 % — AB (ref 11.2–14.5)
WBC: 2.6 10*3/uL — ABNORMAL LOW (ref 3.9–10.3)

## 2013-05-19 LAB — COMPREHENSIVE METABOLIC PANEL (CC13)
ALBUMIN: 3.5 g/dL (ref 3.5–5.0)
ALK PHOS: 44 U/L (ref 40–150)
ALT: 7 U/L (ref 0–55)
AST: 13 U/L (ref 5–34)
Anion Gap: 10 mEq/L (ref 3–11)
BUN: 7.5 mg/dL (ref 7.0–26.0)
CO2: 29 mEq/L (ref 22–29)
Calcium: 9.7 mg/dL (ref 8.4–10.4)
Chloride: 102 mEq/L (ref 98–109)
Creatinine: 0.6 mg/dL (ref 0.6–1.1)
Glucose: 98 mg/dl (ref 70–140)
POTASSIUM: 4.1 meq/L (ref 3.5–5.1)
SODIUM: 141 meq/L (ref 136–145)
TOTAL PROTEIN: 5.8 g/dL — AB (ref 6.4–8.3)
Total Bilirubin: 0.34 mg/dL (ref 0.20–1.20)

## 2013-05-19 MED ORDER — OXYCODONE HCL 5 MG PO TABS: 5.0000 mg | ORAL_TABLET | ORAL | Status: DC | PRN

## 2013-05-19 NOTE — Progress Notes (Signed)
OFFICE PROGRESS NOTE  CC  Anne Dawson, MD [place] [address]  DIAGNOSIS: 68 year old female with new diagnosis of rectal cancer with lung metastasis   PRIOR THERAPY: #17/27/2014 with acute episode of bright red blood per rectum and large clot formation, prompting her to be evaluated at the ED. Of note,colonoscopy prior to this admission had been performed on 03/04/10(screening) which showed three polyps ,largest 8 mm, removed, as well as moderate diverticulosis in the sigmoid colon, otherwise normal exam . On presentation, she had Positive Guaiac and a rectal mass had been palpated on exam. On 7/29 she underwent colonoscopy (Dr. Henrene Pastor) which revealed a bulky, circumferential, friable mass lesion in the rectum of about 4 cm above the dentate line, subtotally obstructing and would not permit passage of the colonoscope proximally. Multiple biopsies were taken.  #2 patient's pathology revealed consistent with adenocarcinoma with primary from rectal. Lung biopsy was performed also that revealed metastatic disease.  #3 patient was seen by Dr. Roxan Hockey for possible resection of the lung metastasis. He recommended we proceed with chemotherapy first to see if we could do both lung metastases and with eventual resection. #4 patient is status post 5 cycles of FOLFOX 6 administered from 11/22/2012 through November 2014.  #5 patient is now status post resection of lung mass on 02/27/2013 by Dr. Erasmo Leventhal. Final pathology did reveal 1. Lung, resection (segmental or lobe), Right upper - ADENOCARCINOMA, CONSISTENT WITH METASTATIC COLORECTAL ADENOCARCINOMA, WELL TO MODERATELY DIFFERENTIATED, SPANNING 4.3 CM. - THE SURGICAL RESECTION MARGINS ARE NEGATIVE FOR ADENOCARCINOMA. - THERE IS NO EVIDENCE OF CARCINOMA IN 2 OF 2 LYMPH NODES (0/2). 2. Soft tissue, biopsy, Diaphragm margin #2 - THERE IS NO EVIDENCE OF MALIGNANCY. 3. Soft tissue, biopsy, Diaphragm #3 -  THERE IS NO EVIDENCE OF MALIGNANCY. 4. Lymph node, biopsy, Right 11 - THERE IS NO EVIDENCE OF CARCINOMA IN 1 OF 1 LYMPH NODE (0/1). - SEE COMMENT. 5. Lymph node, biopsy, Right 11 #2 - THERE IS NO EVIDENCE OF CARCINOMA IN 1 OF 1 LYMPH NODE (0/1). - SEE COMMENT. 6. Lymph node, biopsy, Right 11 #3 - THERE IS NO EVIDENCE OF CARCINOMA IN 1 OF 1 LYMPH NODE (0/1). - SEE COMMENT. 7. Lymph node, biopsy, Right 11 # 4 - THERE IS NO EVIDENCE OF CARCINOMA IN 1 OF 1 LYMPH NODE (0/1). - SEE COMMENT. 8. Lymph node, biopsy, Right #12 - THERE IS NO EVIDENCE OF CARCINOMA IN 1 OF 1 LYMPH NODE (0/1). - SEE COMMENT. 9. Lymph node, biopsy, Right #9 - THERE IS NO EVIDENCE OF CARCINOMA IN 1 OF 1 LYMPH NODE (0/1). - SEE COMMENT.  #6 status post radiation therapy to the rectum with concurrent Xeloda at 1000 mg twice a day. Patient completed all of her treatment from 03/07/2013 through 04/11/2013.   CURRENT THERAPY:  Clinical monitoring/proceed with rectal surgery by Dr. Creola Corn  INTERVAL HISTORY: Anne [name] 68 y.o. female returns for followup visit.clinically she seems to be doing better. She tells me that her abdominal pain is improving. However she did have a visit to the [facility]. She had a CT of the abdomen. She was not found to have an obstruction. She was treated with pain medications. She was given OxyIR. Patient does tell me that the OxyIR is helping her much better than the other pain medications that she had been on which was the oxycodone. She is going  To have her surgery performed in 2 weeks. She denies any nausea vomiting fevers chills  or night sweats. She denies any peripheral . She does have on going diarrhea and constipation issues. She is now managing it with MiraLax. She has not noticed any bleeding rectally. Remainder of the 10 point review of systems is negative. MED ICAL HISTORY: Past Medical History  Diagnosis Date  . Postmenopausal HRT (hormone replacement therapy) 07/01/2011     surgical menopause  . Hx of hyperglycemia 07/01/2011  . Hx of compression fracture of spine 1985    t12 after a  20 foot fall  . Hx of fracture of wrist     after fall   . 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 Hx of colonic polyps   . Early cataract     stoneburner   . High frequency hearing loss     [facility] followed   . Diabetes mellitus without complication   . Complication of anesthesia     pt hadpanic attacks with versed, no versed  . Status post chemotherapy     completed a total of three cycles of Folfox   . Colon cancer     rectal cancer with lung metastasis  . Skin cancer     multiple  . Heart murmur, systolic 08/30/9474    innocent per Dr. Gershon Mussel   . Asthma     as a child none since 43 years old  . Headache(784.0)     ALLERGIES:  is allergic to ativan; midazolam; zolpidem tartrate; bacitracin; moviprep; and neomycin-bacitracin zn-polymyx.  MEDICATIONS:  Current Outpatient Prescriptions  Medication Sig Dispense Refill  . ALOE VERA REPLENISHING BODY EX Apply 1 application topically 2 (two) times daily.      . Calcium Carbonate (CALCIUM 600 PO) Take 1 tablet by mouth daily.       . capecitabine (XELODA) 500 MG tablet Take 1,000 mg by mouth 2 (two) times daily after a meal. Took last dose on 04/11/13      . estradiol (MINIVELLE) 0.0375 MG/24HR Place 1 patch onto the skin 2 (two) times a week. Sundays and Thursdays      . metFORMIN (GLUCOPHAGE) 1000 MG tablet Take 1,000 mg by mouth 2 (two) times daily with a meal.      . METHYLTESTOSTERONE PO Take 1 capsule by mouth daily.      . Multiple Vitamin (MULTIVITAMIN) tablet Take 1 tablet by mouth daily. Senior      . omeprazole (PRILOSEC) 40 MG capsule Take 1 capsule (40 mg total) by mouth daily.  30 capsule  6  . OVER THE COUNTER MEDICATION Place 1 drop into both ears 2 (two) times a week.      Marland Kitchen oxyCODONE (ROXICODONE) 5 MG immediate release tablet Take 1 tablet (5 mg total) by mouth every 4 (four) hours as needed for  breakthrough pain.  25 tablet  0  . oxyCODONE-acetaminophen (PERCOCET/ROXICET) 5-325 MG per tablet Take 1-2 tablets by mouth every 4 (four) hours as needed for moderate pain.  180 tablet  0  . Probiotic Product (SOLUBLE FIBER/PROBIOTICS PO) Take 1 tablet by mouth daily.      . sitaGLIPtin (JANUVIA) 100 MG tablet Take 100 mg by mouth daily.       No current facility-administered medications for this visit.    SURGICAL HISTORY:  Past Surgical History  Procedure Laterality Date  . Tonsillectomy  1952  . Adenoidectomy  1952  . Wrist surgery  1957    left reduction  . Compression fraction  1985    T12  .  Anterior cruciate ligament repair  1993    right Murphy  . Cosmetic surgery  1999-2007    holderness blepharoplasty facial rhytidectomy mastopexy abdominoplasty   . Dental implant    . Dilation and curettage of uterus  1977  . Total abdominal hysterectomy  2005    with cystocele repair   . Colonoscopy N/A 10/24/2012    Procedure: COLONOSCOPY;  Surgeon: Irene Shipper, MD;  Location: [facility];  Service: Endoscopy;  Laterality: N/A;  . Colonoscopy N/A 10/25/2012    Procedure: COLONOSCOPY;  Surgeon: Irene Shipper, MD;  Location: [facility];  Service: Endoscopy;  Laterality: N/A;  . Video bronchoscopy Bilateral 10/27/2012    Procedure: VIDEO BRONCHOSCOPY WITH FLUORO;  Surgeon: Chesley Mires, MD;  Location[facility] ENDOSCOPY;  Service: Endoscopy;  Laterality: Bilateral;  . Portacath placement Left 11/21/2012    Procedure: INSERTION PORT-A-CATH;  Surgeon: Melrose Nakayama, MD;  Location: [place] [facility];  Service: Thoracic;  Laterality: Left;  . Video assisted thoracoscopy Right 12/21/2012    Procedure: VIDEO ASSISTED THORACOSCOPY;  Surgeon: Melrose Nakayama, MD;  Location: [place];  Service: Thoracic;  Laterality: Right;  . Pleural effusion drainage Right 12/21/2012    Procedure: DRAINAGE OF PLEURAL EFFUSION;  Surgeon: Melrose Nakayama, MD;  Location: [facility];  Service: Thoracic;  Laterality: Right;   . Decortication Right 12/21/2012    Procedure: DECORTICATION;  Surgeon: Melrose Nakayama, MD;  Location: [facility];  Service: Thoracic;  Laterality: Right;  . Thorocotomy with lobectomy Right 02/27/2013    Procedure: THOROCOTOMY WITH BI- LOBECTOMY;  Surgeon: Melrose Nakayama, MD;  Location: [place];  Service: Thoracic;  Laterality: Right;    REVIEW OF SYSTEMS:  Pertinent items are noted in HPI.   HEALTH MAINTENANCE:   PHYSICAL EXAMINATION: Blood pressure 153/82, pulse 75, temperature 97.7 F (36.5 C), temperature source Oral, resp. rate 20, height 5\' 4"  (1.626 m), weight 114 lb 11.2 oz (52.028 kg). Body mass index is 19.68 kg/(m^2). General: Patient is a well appearing female in no acute distress HEENT: PERRLA, sclerae anicteric no conjunctival pallor, MMM Neck: supple, no palpable adenopathy Lungs: clear to auscultation bilaterally, no wheezes, rhonchi, or rales Cardiovascular: regular rate rhythm, S1, S2, no murmurs, rubs or gallops Abdomen: Soft, non-tender, non-distended, normoactive bowel sounds, no HSM Extremities: warm and well perfused, no clubbing, cyanosis, or edema Skin: No rashes or lesions Neuro: Non-focal ECOG PERFORMANCE STATUS: 0 - Asymptomatic  LABORATORY DATA: Lab Results  Component Value Date   WBC 2.6* 05/19/2013   HGB 12.8 05/19/2013   HCT 38.5 05/19/2013   MCV 94.0 05/19/2013   PLT 340 05/19/2013      Chemistry      Component Value Date/Time   NA 140 05/12/2013 1227   NA 138 04/25/2013 1008   K 4.0 05/12/2013 1227   K 3.9 04/25/2013 1008   CL 97 05/12/2013 1227   CO2 18* 05/12/2013 1227   CO2 26 04/25/2013 1008   BUN 14 05/12/2013 1227   BUN 9.5 04/25/2013 1008   CREATININE 0.37* 05/12/2013 1227   CREATININE 0.6 04/25/2013 1008      Component Value Date/Time   CALCIUM 9.3 05/12/2013 1227   CALCIUM 9.4 04/25/2013 1008   ALKPHOS 46 05/12/2013 1227   ALKPHOS 42 04/25/2013 1008   AST 17 05/12/2013 1227   AST 12 04/25/2013 1008   ALT 10 05/12/2013 1227   ALT 10  04/25/2013 1008   BILITOT 0.4 05/12/2013 1227   BILITOT 0.29 04/25/2013 1008  RADIOGRAPHIC STUDIES:  Dg Chest 2 View Within Previous 72 Hours.  Films Obtained On Friday Are Acceptable For Monday And Tuesday Cases  11/18/2012   CLINICAL DATA:  68 year old female preoperative study. Lung mass. Colon cancer.  EXAM: CHEST  2 VIEW  COMPARISON:  PET-CT 11/02/2012 and earlier.  FINDINGS: 5 cm right lower lobe lung mass re- identified. Radiographically this appears not significantly changed since 10/25/2012. The 6 mm cavitary right upper lobe pulmonary nodule is faint but visible. Stable lung volumes. Stable cardiac size and mediastinal contours. The left lung is clear. No pneumothorax or effusion. Stable visualized osseous structures. Chronic T12 compression fracture. Flowing thoracic osteophytes.  IMPRESSION: Known right lung lesions. No new cardiopulmonary abnormality.   Electronically Signed   By: Lars Pinks   On: 11/18/2012 15:53   Nm Pet Image Initial (pi) Skull Base To Thigh  11/02/2012   *RADIOLOGY REPORT*  Clinical Data:  Initial treatment strategy for rectal cancer. Circumferential rectal cancer 4 cm from the anal verge.  4 cm right lung mass.  NUCLEAR MEDICINE PET WHOLE BODY  Fasting Blood Glucose:  123  Technique:  19.1 mCi F-18 FDG was injected intravenously. CT data was obtained and used for attenuation correction and anatomic localization only.  (This was not acquired as a diagnostic CT examination.) Additional exam technical data entered on technologist worksheet.  Comparison:  CT scan from 10/25/2012  Findings:  Head/Neck:   No hypermetabolic lymph nodes in the neck.  Chest:  The previously characterized 5 cm right lower lobe pulmonary mass is hypermetabolic with SUV max = 8.  No evidence for hypermetabolic lymphadenopathy in the mediastinum or either hilum. No other hypermetabolic lung lesions are evident although the patient does have bilateral small pulmonary parenchymal nodules (see  posterior right upper lobe on image 73 and posterior right lower lobe on image 78).  Both of these small nodules are below the accepted size threshold for reliable resolution on PET imaging.  Abdomen/Pelvis:  No abnormal hypermetabolism is identified within the liver.  No hypermetabolic lymphadenopathy in the abdomen.  There is a prominent amount of physiologic F D G uptake in the colon, but this is associated with hypermetabolic uptake in a region of circumferential rectal wall thickening.  SUV max = 9 for the rectal hypermetabolism.  Skeleton:  No focal hypermetabolic activity to suggest skeletal metastasis.  IMPRESSION: Hypermetabolism in the area of apparent circumferential rectal wall thickening associate with hypermetabolic 5 cm right lower lobe pulmonary mass.  6 mm cavitary pulmonary nodule the posterior right upper lobe is not hypermetabolic on PET imaging, but is below the accepted size threshold for reliable resolution on PET.  A second tiny right lower lobe nodule is also noted.   Original Report Authenticated By: Misty Stanley, M.D.   [facility] Report  11/21/2012   CLINICAL DATA: Done in OR # 10,Intraoperative insertion port a cath   FLOURO GUIDE CV LINE  Fluoroscopy was utilized by the requesting physician.  No radiographic  interpretation.     ASSESSMENT/PLAN: 68 year old female with  #1 metastatic rectal carcinoma with metastasis to the lung. Patient initially underwent 4 cycles of FOLFOX 6. She then went on to have resection of the metastasis in the right lung. She has recovered from this very nicely.  #2 patient was seen by Dr. Vita Barley at [facility]. He has recommended the possibility of doing the colon surgery first. We will try to coordinate this. Certainly we could after the fifth cycle  of chemotherapy she is going to have a proctoscope performed at [facility]. After that she certainly could begin radiation therapy with radiosensitizing Xeloda. When she  completes this she will proceed with her surgery. Once she recovers from it we certainly could get her started back on radiation therapy  #3 patient is now status post concurrent radiation and Xeloda completed 04/11/2013. Overall she tolerated it well.  #4 abdominal pain unclear etiology. We will continue to monitor this very closely. She is taking pain medications and a refill was given to her today. She does tell me if this helps. She is having regular bowel movements. He has no evidence of obstruction.  #5 left upper extremity pain: Secondary to possibly trauma from recent surgical procedure we will send her for physical therapy.  #6 protein malnutrition: Patient is encouraged to continue eating protein rich foods. We will also send it to our dietitian for further evaluation and management  #7 patient will be seen back in 3-4 weeks' time for followup.   All questions were answered. The patient knows to call the clinic with any problems, questions or concerns. We can certainly see the patient much sooner if necessary.  I spent 20 minutes counseling the patient face to face. The total time spent in the appointment was 30 minutes.  Marcy Panning, MD Medical/Oncology [facility] [phone] (beeper) [phone] (Office)

## 2013-05-19 NOTE — Telephone Encounter (Signed)
Per Dr. Humphrey Rolls, please call [facility] and schedule an appointment for next week to see Dr. Carlean Purl.  GI gave an appointment date for Tuesday, 05/23/13 at 8:30 am. I instructed patient to call office if she could not keep the appointment. Patient verbalized understanding.

## 2013-05-19 NOTE — Patient Instructions (Signed)
5FU/leucovorin/ Oxliplatin (FOLFOX)

## 2013-05-22 ENCOUNTER — Ambulatory Visit: Payer: 59 | Admitting: Physical Therapy

## 2013-05-23 ENCOUNTER — Ambulatory Visit
Admission: RE | Admit: 2013-05-23 | Discharge: 2013-05-23 | Disposition: A | Discharge status: Home / Self Care | Payer: 59 | Source: Ambulatory Visit | Attending: Thoracic Surgery (Cardiothoracic Vascular Surgery) | Admitting: Thoracic Surgery (Cardiothoracic Vascular Surgery)

## 2013-05-23 ENCOUNTER — Ambulatory Visit (INDEPENDENT_AMBULATORY_CARE_PROVIDER_SITE_OTHER): Payer: 59 | Admitting: Internal Medicine

## 2013-05-23 ENCOUNTER — Encounter: Payer: Self-pay | Admitting: Nurse Practitioner

## 2013-05-23 ENCOUNTER — Ambulatory Visit (INDEPENDENT_AMBULATORY_CARE_PROVIDER_SITE_OTHER): Payer: Self-pay | Admitting: Thoracic Surgery (Cardiothoracic Vascular Surgery)

## 2013-05-23 ENCOUNTER — Ambulatory Visit (INDEPENDENT_AMBULATORY_CARE_PROVIDER_SITE_OTHER): Payer: Self-pay | Admitting: Nurse Practitioner

## 2013-05-23 ENCOUNTER — Encounter: Payer: Self-pay | Admitting: Internal Medicine

## 2013-05-23 ENCOUNTER — Telehealth: Payer: Self-pay | Admitting: Dietician

## 2013-05-23 ENCOUNTER — Encounter: Payer: Self-pay | Admitting: Thoracic Surgery (Cardiothoracic Vascular Surgery)

## 2013-05-23 VITALS — BP 120/73 | HR 100 | Resp 20 | Ht 64.0 in | Wt 115.0 lb

## 2013-05-23 VITALS — BP 110/60 | HR 66 | Ht 64.5 in | Wt 115.0 lb

## 2013-05-23 DIAGNOSIS — R918 Other nonspecific abnormal finding of lung field: Secondary | ICD-10-CM

## 2013-05-23 DIAGNOSIS — C7800 Secondary malignant neoplasm of unspecified lung: Secondary | ICD-10-CM

## 2013-05-23 DIAGNOSIS — C20 Malignant neoplasm of rectum: Secondary | ICD-10-CM

## 2013-05-23 DIAGNOSIS — Z902 Acquired absence of lung [part of]: Secondary | ICD-10-CM

## 2013-05-23 DIAGNOSIS — Z9889 Other specified postprocedural states: Secondary | ICD-10-CM

## 2013-05-23 DIAGNOSIS — R1032 Left lower quadrant pain: Secondary | ICD-10-CM

## 2013-05-23 NOTE — Patient Instructions (Signed)
Please follow Low Fiber Diet given today  Good luck with your surgery next Wednesday   Please call Dr. Carlean Purl if you are not feeling better

## 2013-05-23 NOTE — Assessment & Plan Note (Signed)
She should proceed to surgery next week. I believe this is the source of some of her pain if not all.

## 2013-05-23 NOTE — Assessment & Plan Note (Signed)
Proceed to surgery

## 2013-05-23 NOTE — Telephone Encounter (Signed)
Brief Outpatient Oncology Nutrition Note  Patient has been identified to be at risk on malnutrition screen.  Wt Readings from Last 10 Encounters:  05/23/13 115 lb (52.164 kg)  05/23/13 115 lb (52.164 kg)  05/19/13 114 lb 11.2 oz (52.028 kg)  05/11/13 117 lb (53.071 kg)  05/11/13 117 lb (53.071 kg)  04/25/13 118 lb (53.524 kg)  04/11/13 116 lb (52.617 kg)  04/07/13 116 lb 8 oz (52.844 kg)  04/04/13 116 lb 11.2 oz (52.935 kg)  03/31/13 117 lb 6.4 oz (53.252 kg)    Patient with rectal cancer with lung mets for rectal cancer next week in [place].  Called patient who was unavailable.  Seen by the [facility] RD.  Antonieta Iba, RD, LDN

## 2013-05-23 NOTE — Progress Notes (Signed)
         Subjective:    Patient ID: Anne [name], female    DOB: Apr 18, 1945, 68 y.o.   MRN: [id number]  HPI Dr. [name] is here with her sister - she has had two severe episodes of LLQ pain associated with abdominal distention. The last was 05/12/2013 - came after eating. She was evlauated in the ED with labs and a CT scan abd/pelvis which did not reveal any obvious cause. I have reviewed those.  She is not having any further episodes and is 8 days from surgery to resect her rectal cancer - to be done in [place].  She is trying to avoid raw vegetables at this time. She now has OxyIR for acute pain and is using Percoect routinely. This is working better.   Review of Systems Wt Readings from Last 3 Encounters:  05/23/13 115 lb (52.164 kg)  05/19/13 114 lb 11.2 oz (52.028 kg)  05/11/13 117 lb (53.071 kg)       Objective:   Physical Exam Thin, NAD Abdomen is scaphoid, low transverse scar - hyperpigmented skin, sharpie pre-op markings covered with plastic on left abdomen/midline.  BS+, nontender w/o mass or hernia.       Assessment & Plan:

## 2013-05-23 NOTE — Progress Notes (Signed)
HPI:  Dr. Frutoso Chase returns today for a scheduled followup visit.  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 She underwent a right thoracotomy and bilobectomy with en bloc resection of diaphragm and the intercostal muscles on December 1. She was last seen in the office in January.  She has completed radiation therapy for her rectal tumor and is scheduled to have resection next Wednesday at [facility]. She says the plan is to do a resection with a direct anastomosis and a diverting ileostomy.  She says she is doing well from a respiratory standpoint. She has not had any shortness of breath or wheezing. She is not having any significant pain from her thoracotomy incision. She continues to have trouble gaining weight although she says that her appetite is better.  She did have a knot on her left arm after surgery that may have been related to positioning. She was diagnosed with deltoid bursitis and treated with steroids and heat. It has improved significantly.  She was in the emergency room about a week and a half ago with abdominal pain and cramping. A CT of the abdomen was done. There were some suspicious findings in the lungs on CT and she had questions about those.  Past Medical History  Diagnosis Date  . Postmenopausal HRT (hormone replacement therapy) 07/01/2011    surgical menopause  . Hx of hyperglycemia 07/01/2011  . Hx of compression fracture of spine 1985    t12 after a  20 foot fall  . Hx of fracture of wrist     after fall   . 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 Hx of colonic polyps   . Early cataract     stoneburner   . High frequency hearing loss     [facility] followed   . Diabetes mellitus without complication   . Complication of anesthesia     pt hadpanic attacks with versed, no versed  . Status post chemotherapy     completed a total of three cycles of Folfox   . Colon cancer     rectal cancer with lung metastasis  .  Skin cancer     multiple  . Heart murmur, systolic 6/0/4540    innocent per Dr. Gershon Mussel   . Asthma     as a child none since 63 years old  . [id number](829.5)         Current Outpatient Prescriptions  Medication Sig Dispense Refill  . ALOE VERA REPLENISHING BODY EX Apply 1 application topically 2 (two) times daily.      . Calcium Carbonate (CALCIUM 600 PO) Take 1 tablet by mouth daily.       Marland Kitchen estradiol (MINIVELLE) 0.0375 MG/24HR Place 1 patch onto the skin 2 (two) times a week. Sundays and Thursdays      . metFORMIN (GLUCOPHAGE) 1000 MG tablet Take 1,000 mg by mouth 2 (two) times daily with a meal.      . METHYLTESTOSTERONE PO Take 1 capsule by mouth daily.      . Multiple Vitamin (MULTIVITAMIN) tablet Take 1 tablet by mouth daily. Senior      . omeprazole (PRILOSEC) 40 MG capsule Take 1 capsule (40 mg total) by mouth daily.  30 capsule  6  . OVER THE COUNTER MEDICATION Place 1 drop into both ears 2 (two) times a week.      Marland Kitchen oxyCODONE (ROXICODONE) 5 MG immediate release tablet Take 1  tablet (5 mg total) by mouth every 4 (four) hours as needed for breakthrough pain.  60 tablet  0  . oxyCODONE-acetaminophen (PERCOCET/ROXICET) 5-325 MG per tablet Take 1-2 tablets by mouth every 4 (four) hours as needed for moderate pain.  180 tablet  0  . Probiotic Product (SOLUBLE FIBER/PROBIOTICS PO) Take 1 tablet by mouth daily.      . prochlorperazine (COMPAZINE) 10 MG tablet TAKE ONE TABLET EVERY 6 HOURS AS NEEDED FOR NAUSEA AND VOMITING  30 tablet  6  . sitaGLIPtin (JANUVIA) 100 MG tablet Take 100 mg by mouth daily.       No current facility-administered medications for this visit.    Physical Exam BP 120/73  Pulse 100  Resp 20  Ht 5\' 4"  (1.626 m)  Wt 115 lb (52.164 kg)  BMI 19.73 kg/m2  SpO2 22% Thin 68 year old woman in no acute distress Diminished breath sounds right base  Diagnostic Tests: CT chest 05/12/2013 CT ABDOMEN AND PELVIS WITH CONTRAST  TECHNIQUE:  Multidetector CT imaging  of the abdomen and pelvis was performed  using the standard protocol following bolus administration of  intravenous contrast.  CONTRAST: 2mL OMNIPAQUE IOHEXOL 300 MG/ML SOLN, 131mL OMNIPAQUE  IOHEXOL 300 MG/ML SOLN  COMPARISON: 01/28/2013  FINDINGS:  Metastatic nodules in the lung have increased. Metastatic nodule in  the posterior aspect of the right upper lobe on image number 3 of  series 7 measures 11 mm, 5 mm nodule on image 7 of series 7 in the  left lower lobe, and 4 mm nodule in the left lower lobe on image 9  of series 7. There is a chronic loculated right base pneumothorax.  There is extensive mucosal thickening of the sigmoid portion of the  colon. There is stool throughout the entire colon. There is some  stool in the rectum. There is an area just above the rectum which  demonstrates narrowing which may be purely a contraction.  The liver, biliary tree, spleen, pancreas, adrenal glands, and  kidneys demonstrate no significant abnormalities. Small bowel is not  distended. No acute osseous abnormality.  IMPRESSION:  Progressive metastatic lesions in the lungs. Chronic loculated right  base pneumothorax. Diffuse mucosal thickening of the sigmoid portion  of the colon as described.  Electronically Signed  By: Rozetta Nunnery M.D.  On: 05/12/2013 14:51   Chest x-ray 05/23/2013 CHEST 2 VIEW  COMPARISON: 03/14/2013 and earlier.  FINDINGS:  Stable appearance following partial right lung resection with  hydropneumothorax of the pneumonectomy space. Stable residual right  lung aeration.  Stable left chest porta cath. Stable cardiac size and mediastinal  contours. Visualized tracheal air column is within normal limits.  The left lung appears clear and stable. Stable visualized osseous  structures.  IMPRESSION:  Stable post treatment appearance of the chest. No acute  cardiopulmonary abnormality.  Electronically Signed  By: Lars Pinks M.D.  Impression: 68 year old woman with  metastatic rectal cancer. She had a right lower and middle lobectomy with chest wall resection and resection of a portion of the diaphragm back in December. She now has completed radiation therapy and is scheduled to have her rectal cancer resected in [place] next week.  She recently had a chest CT which showed some concerning findings and the lungs. There were 2 left lower lobe nodules. The smaller one was 4 mm and unchanged it likely is a lymph node. There was a second more central nodule left lower lobe that may have increased in size by a couple of millimeters.  There was a right upper lobe nodule that does appear to have increased in size in the interim since her last CT. This lesion was not hypermetabolic on PET prior to her lung resection, however it may have been too small to register on the PET at the time. While I think there definitely has been some growth, is hard to get a handle exactly how much due to the distortion of the lung architecture postoperatively.  My recommendation to her was that she proceed with her surgery next week. I think we do need to do a short interval of followup CT in about 8 weeks to see if there has been any progression of the lung nodules. We can then base the decision as to whether there is any need for a PET on the findings of the CT.  Plan: Proceed with rectal surgery.  Return in 8 weeks with CT of chest

## 2013-05-25 ENCOUNTER — Ambulatory Visit: Payer: 59 | Admitting: Physical Therapy

## 2013-05-29 ENCOUNTER — Ambulatory Visit: Payer: 59 | Admitting: Physical Therapy

## 2013-05-30 ENCOUNTER — Telehealth: Payer: Self-pay | Admitting: Oncology

## 2013-05-30 ENCOUNTER — Ambulatory Visit: Payer: 59 | Admitting: Thoracic Surgery (Cardiothoracic Vascular Surgery)

## 2013-05-30 ENCOUNTER — Telehealth: Payer: Self-pay | Admitting: Internal Medicine

## 2013-05-30 NOTE — Telephone Encounter (Signed)
m °

## 2013-05-30 NOTE — Telephone Encounter (Signed)
Pt is having colon cancer surgery tomorrow 05/31/13.

## 2013-06-05 ENCOUNTER — Other Ambulatory Visit: Payer: Self-pay | Admitting: Internal Medicine

## 2013-06-05 ENCOUNTER — Telehealth: Payer: Self-pay | Admitting: Internal Medicine

## 2013-06-05 NOTE — Telephone Encounter (Signed)
[facility] is needing verbal order for physical therapy 1 two times a week for 4 weeks and needs prescription to [facility] for a 3 in 1 commode. [phone] for [facility].

## 2013-06-07 NOTE — Telephone Encounter (Signed)
Spoke to [employer] (sister) who advised that the commode is not needed per Dr. [name].  Pt will begin therapy tomorrrow.  Instructed for RoseMary to call back if anything else is required.

## 2013-06-07 NOTE — Telephone Encounter (Signed)
Ok to do this but  May need diagnosis code that i dont have.

## 2013-06-13 ENCOUNTER — Other Ambulatory Visit: Payer: Self-pay | Admitting: *Deleted

## 2013-06-13 DIAGNOSIS — R222 Localized swelling, mass and lump, trunk: Secondary | ICD-10-CM

## 2013-06-14 ENCOUNTER — Other Ambulatory Visit: Payer: 59

## 2013-06-14 ENCOUNTER — Ambulatory Visit: Payer: 59 | Admitting: Oncology

## 2013-06-16 ENCOUNTER — Telehealth: Payer: Self-pay | Admitting: Oncology

## 2013-06-16 NOTE — Telephone Encounter (Signed)
Yes, that is ok.  She can see LC and we can have a shared visit

## 2013-06-16 NOTE — Telephone Encounter (Signed)
, °

## 2013-06-19 ENCOUNTER — Telehealth: Payer: Self-pay

## 2013-06-19 NOTE — Telephone Encounter (Signed)
Let pt know she needs to keep appt with [place] on 3/25 and KK will see her with LC.  Gave pt appt times.  Pt voiced understanding.

## 2013-06-21 ENCOUNTER — Other Ambulatory Visit (HOSPITAL_BASED_OUTPATIENT_CLINIC_OR_DEPARTMENT_OTHER): Payer: 59

## 2013-06-21 ENCOUNTER — Telehealth: Payer: Self-pay | Admitting: *Deleted

## 2013-06-21 ENCOUNTER — Telehealth: Payer: Self-pay | Admitting: Oncology

## 2013-06-21 ENCOUNTER — Ambulatory Visit (HOSPITAL_BASED_OUTPATIENT_CLINIC_OR_DEPARTMENT_OTHER): Payer: 59 | Admitting: Adult Health

## 2013-06-21 ENCOUNTER — Encounter: Payer: Self-pay | Admitting: Adult Health

## 2013-06-21 VITALS — BP 133/77 | HR 97 | Temp 98.1°F | Resp 18 | Ht 64.0 in | Wt 111.4 lb

## 2013-06-21 DIAGNOSIS — C20 Malignant neoplasm of rectum: Secondary | ICD-10-CM

## 2013-06-21 DIAGNOSIS — Z932 Ileostomy status: Secondary | ICD-10-CM

## 2013-06-21 DIAGNOSIS — E46 Unspecified protein-calorie malnutrition: Secondary | ICD-10-CM

## 2013-06-21 DIAGNOSIS — R109 Unspecified abdominal pain: Secondary | ICD-10-CM

## 2013-06-21 DIAGNOSIS — C7800 Secondary malignant neoplasm of unspecified lung: Secondary | ICD-10-CM

## 2013-06-21 LAB — CBC WITH DIFFERENTIAL/PLATELET
BASO%: 1.2 % (ref 0.0–2.0)
Basophils Absolute: 0.1 10*3/uL (ref 0.0–0.1)
EOS%: 7.2 % — ABNORMAL HIGH (ref 0.0–7.0)
Eosinophils Absolute: 0.4 10*3/uL (ref 0.0–0.5)
HCT: 33.3 % — ABNORMAL LOW (ref 34.8–46.6)
HGB: 11.3 g/dL — ABNORMAL LOW (ref 11.6–15.9)
LYMPH%: 8.6 % — AB (ref 14.0–49.7)
MCH: 30.9 pg (ref 25.1–34.0)
MCHC: 33.9 g/dL (ref 31.5–36.0)
MCV: 91.2 fL (ref 79.5–101.0)
MONO#: 0.4 10*3/uL (ref 0.1–0.9)
MONO%: 7.2 % (ref 0.0–14.0)
NEUT#: 4.4 10*3/uL (ref 1.5–6.5)
NEUT%: 75.8 % (ref 38.4–76.8)
Platelets: 383 10*3/uL (ref 145–400)
RBC: 3.65 10*6/uL — AB (ref 3.70–5.45)
RDW: 13.7 % (ref 11.2–14.5)
WBC: 5.8 10*3/uL (ref 3.9–10.3)
lymph#: 0.5 10*3/uL — ABNORMAL LOW (ref 0.9–3.3)

## 2013-06-21 LAB — COMPREHENSIVE METABOLIC PANEL (CC13)
ALT: 13 U/L (ref 0–55)
AST: 11 U/L (ref 5–34)
Albumin: 3.4 g/dL — ABNORMAL LOW (ref 3.5–5.0)
Alkaline Phosphatase: 76 U/L (ref 40–150)
Anion Gap: 10 mEq/L (ref 3–11)
BILIRUBIN TOTAL: 0.28 mg/dL (ref 0.20–1.20)
BUN: 11.7 mg/dL (ref 7.0–26.0)
CO2: 24 mEq/L (ref 22–29)
Calcium: 10.5 mg/dL — ABNORMAL HIGH (ref 8.4–10.4)
Chloride: 98 mEq/L (ref 98–109)
Creatinine: 0.6 mg/dL (ref 0.6–1.1)
GLUCOSE: 173 mg/dL — AB (ref 70–140)
Potassium: 4.3 mEq/L (ref 3.5–5.1)
SODIUM: 132 meq/L — AB (ref 136–145)
TOTAL PROTEIN: 6.9 g/dL (ref 6.4–8.3)

## 2013-06-21 NOTE — Progress Notes (Addendum)
Hematology and Oncology Follow Up Visit  Anne [name] [id number] [birth year] 68 y.o. 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 Of note,colonoscopy prior to this admission had been performed on 03/04/10(screening) which showed three polyps ,largest 8 mm, removed, as well as moderate diverticulosis in the sigmoid colon, otherwise normal exam . On presentation, she had Positive Guaiac and a rectal mass had been palpated on exam. On 7/29 she underwent colonoscopy (Dr. Henrene Pastor) which revealed a bulky, circumferential, friable mass lesion in the rectum of about 4 cm above the dentate line, subtotally obstructing and would not permit passage of the colonoscope proximally. Multiple biopsies were taken.   #2 patient's pathology revealed consistent with adenocarcinoma with primary from rectal. Lung biopsy was performed also that revealed metastatic disease.   #3 patient was seen by Dr. Roxan Hockey for possible resection of the lung metastasis. He recommended chemotherapy first followed by eventual resection.   #4 patient is status post 5 cycles of FOLFOX 6 administered from 11/22/2012 through November 2014.   #5 patient is status post resection of lung mass on 02/27/2013 by Dr. Erasmo Leventhal. Final pathology did reveal  1. Lung, resection (segmental or lobe), Right upper  - ADENOCARCINOMA, CONSISTENT WITH METASTATIC COLORECTAL ADENOCARCINOMA,  WELL TO MODERATELY DIFFERENTIATED, SPANNING 4.3 CM.  - THE SURGICAL RESECTION MARGINS ARE NEGATIVE FOR ADENOCARCINOMA.  - THERE IS NO EVIDENCE OF CARCINOMA IN 2 OF 2 LYMPH NODES (0/2).  2. Soft tissue, biopsy, Diaphragm margin #2  - THERE IS NO EVIDENCE OF MALIGNANCY.  3. Soft tissue, biopsy, Diaphragm #3  - THERE IS NO EVIDENCE OF  MALIGNANCY.  4. Lymph node, biopsy, Right 11  - THERE IS NO EVIDENCE OF CARCINOMA IN 1 OF 1 LYMPH NODE (0/1).  - SEE COMMENT.  5. Lymph node, biopsy, Right 11 #2  - THERE IS NO EVIDENCE OF CARCINOMA IN 1 OF 1 LYMPH NODE (0/1).  - SEE COMMENT.  6. Lymph node, biopsy, Right 11 #3  - THERE IS NO EVIDENCE OF CARCINOMA IN 1 OF 1 LYMPH NODE (0/1).  - SEE COMMENT.  7. Lymph node, biopsy, Right 11 # 4  - THERE IS NO EVIDENCE OF CARCINOMA IN 1 OF 1 LYMPH NODE (0/1).  - SEE COMMENT.  8. Lymph node, biopsy, Right #12  - THERE IS NO EVIDENCE OF CARCINOMA IN 1 OF 1 LYMPH NODE (0/1).  - SEE COMMENT.  9. Lymph node, biopsy, Right #9  - THERE IS NO EVIDENCE OF CARCINOMA IN 1 OF 1 LYMPH NODE (0/1).  - SEE COMMENT.   #6 status post radiation therapy to the rectum with concurrent Xeloda at 1000 mg twice a day. Patient completed all of her treatment from 03/07/2013 through 04/11/2013.  #7 Patient underwent rectal surgery with a resection and a direct anastomosis and diverting ileostomy on 05/31/13.    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  She is following up with [place] after her surgery that she had at [place].  She had a resection with anastamosis and diverting ileostomy on 05/31/13.  She has been taking care of her ileostomy without difficulty and her surgical wounds are healing well.  Her pain  has improved since surgery and is now about a 4/10.  She takes Percocet at night to help with her pain and help her sleep.  Her pain is worse with coughing.  She is requesting information on all of her chemotherapy treatment thus far.  She is also requesting an appointment with nutrition to determine the amount of calories she should take in to maintain her weight with her new ileostomy, the amount of calories she should take in to gain wait to greater than 120 pounds with her ileostomy, and suggestions of high caloric low fiber  foods that she can eat. She is interested in knowing her future treatment plan.  Otherwise, she denies fevers, chills, nausea, vomiting, night sweats, numbness, dysuria, or any other questions/concerns.    Medications:  Current Outpatient Prescriptions  Medication Sig Dispense Refill  . ALOE VERA REPLENISHING BODY EX Apply 1 application topically 2 (two) times daily.      . Calcium Carbonate (CALCIUM 600 PO) Take 1 tablet by mouth daily.       Marland Kitchen estradiol (MINIVELLE) 0.0375 MG/24HR Place 1 patch onto the skin 2 (two) times a week. Sundays and Thursdays      . JANUVIA 100 MG tablet TAKE ONE TABLET EACH DAY  30 tablet  2  . metFORMIN (GLUCOPHAGE) 1000 MG tablet Take 1,000 mg by mouth 2 (two) times daily with a meal.      . Multiple Vitamin (MULTIVITAMIN) tablet Take 1 tablet by mouth daily. Senior      . omeprazole (PRILOSEC) 40 MG capsule Take 1 capsule (40 mg total) by mouth daily.  30 capsule  6  . oxyCODONE (ROXICODONE) 5 MG immediate release tablet Take 1 tablet (5 mg total) by mouth every 4 (four) hours as needed for breakthrough pain.  60 tablet  0  . oxyCODONE-acetaminophen (PERCOCET/ROXICET) 5-325 MG per tablet Take 1-2 tablets by mouth every 4 (four) hours as needed for moderate pain.  180 tablet  0  . Probiotic Product (SOLUBLE FIBER/PROBIOTICS PO) Take 1 tablet by mouth daily.      Marland Kitchen OVER THE COUNTER MEDICATION Place 1 drop into both ears 2 (two) times a week.      . prochlorperazine (COMPAZINE) 10 MG tablet TAKE ONE TABLET EVERY 6 HOURS AS NEEDED FOR NAUSEA AND VOMITING  30 tablet  6   No current facility-administered medications for this visit.     Allergies:  Allergies  Allergen Reactions  . Ativan [Lorazepam] Anxiety and Other (See Comments)    Causes panic attacks  . Midazolam Anxiety and Other (See Comments)    Caused very crazy feelings, terrors, panic attacks  . Zolpidem Tartrate Other (See Comments)    Caused very crazy feelings, terrors, shaking, panic attacks,  anxiety  . Bacitracin Other (See Comments)    Eye  Reaction topical  . Moviprep [Peg-Kcl-Nacl-Nasulf-Na Asc-C] Other (See Comments)    Sent pt to ED due to distention  . Neomycin-Bacitracin Zn-Polymyx Itching and Rash    Past Medical History, Surgical history, Social history, and Family History were reviewed and updated.  Review of Systems: A 10 point review of systems was conducted and is otherwise negative except for what is noted above.     Physical Exam: Blood pressure 133/77, pulse 97, temperature 98.1 F (36.7 C), temperature source Oral, resp. rate 18, height 5\' 4"  (1.626 m), weight 111 lb 6.4 oz (50.531 kg). GENERAL: Patient is a well appearing female in no acute distress HEENT:  Sclerae anicteric.  Oropharynx  clear and moist. No ulcerations or evidence of oropharyngeal candidiasis. Neck is supple.  NODES:  No cervical, supraclavicular, or axillary lymphadenopathy palpated.  LUNGS:  Clear to auscultation bilaterally.  No wheezes or rhonchi. HEART:  Regular rate and rhythm. No murmur appreciated. ABDOMEN:  Soft, nontender.  Positive, normoactive bowel sounds. No organomegaly palpated. S/p ileostomy placement.  Stoma is pink, bag intact.  Incision sites are healing well and display no erythema or signs of infection.   MSK:  No focal spinal tenderness to palpation. Full range of motion bilaterally in the upper extremities. EXTREMITIES:  No peripheral edema.   SKIN:  Clear with no obvious rashes or skin changes. No nail dyscrasia. NEURO:  Nonfocal. Well oriented.  Appropriate affect. ECOG: 2  Lab Results: Lab Results  Component Value Date   WBC 5.8 06/21/2013   HGB 11.3* 06/21/2013   HCT 33.3* 06/21/2013   MCV 91.2 06/21/2013   PLT 383 06/21/2013     Chemistry      Component Value Date/Time   NA 132* 06/21/2013 0819   NA 140 05/12/2013 1227   K 4.3 06/21/2013 0819   K 4.0 05/12/2013 1227   CL 97 05/12/2013 1227   CO2 24 06/21/2013 0819   CO2 18* 05/12/2013 1227   BUN 11.7  06/21/2013 0819   BUN 14 05/12/2013 1227   CREATININE 0.6 06/21/2013 0819   CREATININE 0.37* 05/12/2013 1227      Component Value Date/Time   CALCIUM 10.5* 06/21/2013 0819   CALCIUM 9.3 05/12/2013 1227   ALKPHOS 76 06/21/2013 0819   ALKPHOS 46 05/12/2013 1227   AST 11 06/21/2013 0819   AST 17 05/12/2013 1227   ALT 13 06/21/2013 0819   ALT 10 05/12/2013 1227   BILITOT 0.28 06/21/2013 0819   BILITOT 0.4 05/12/2013 1227      Assessment and Plan:   Patient is a 68 y/o female with  1. Stage IV rectal cancer: Patient has underwent recent surgical resection after completing 5 cycles of FOLFOX6 chemotherapy.  We are awaiting her final pathology results and will continue to request these from [facility].  I have typed out her previous treatment regimens in detail with detailed information about each chemotherapy she was treated with along with the dates.    2.  Abdominal pain: Patient will continue to take Percocet PRN.  3.  New ileostomy: Patient is caring for new ileostomy well and receives her supplies and is working closely with the stoma nurses at [facility] supply.  Her incisions are healing nicely.  4.  Nutrition:  I have requested the patient have a f/u appointment with Dory Peru for evaluation of her nutritional needs and suggestion of high caloric low fiber foods.  The patient will return in 4 weeks for labs and an office visit to discuss further treatment planning.  She knows to call our office in the interim for any questions or further concerns.     I spent 25 minutes counseling the patient face to face.  The total time spent in the appointment was 30 minutes.  Minette Headland, [place] [phone] 3/27/201510:31 AM   ADDENDUM:    I personally saw this patient and performed a substantive portion of this encounter with the listed APP documented above.   Patient is now status post surgery for her rectal carcinoma. Per patient's  report she had no evidence of residual disease. She is recovering from the surgery just fine. She will eventually  need adjuvant chemotherapy. We discussed the possibility of doing FOLFOX and Avastin. She has received FOLFOX before. At her neoadjuvant approach we did not use of Avastin do to multiple reasons. But I do think it is now safe to start Avastin. My plan would be to start her in about 4-6 weeks time. I would like to see her get a little bit more stronger gained some weight. She also concurs with this  Marcy Panning, MD

## 2013-06-21 NOTE — Telephone Encounter (Signed)
To obtain records from [facility] for this patient a signed release needs to be faxed.  "Anne [name] to [phone].  If patient unable to sign request today a request may be sent on our letter head to [facility] with patient's name, d.o.b, address and phone number.  With this request send our office contact name,phone number and fax number.  If any questions call [phone].  Will notify providers.

## 2013-06-21 NOTE — Telephone Encounter (Signed)
gv pt appt schedule for march/april

## 2013-06-21 NOTE — Patient Instructions (Signed)
Chemotherapy regimen:  Folfox from 8/26-01/2013 5-Fluorouracil, Leucovorin, Oxaliplatin  Xeloda with radiation from 03/07/13 through 04/11/13  Fluorouracil, 5-FU injection What is this medicine? FLUOROURACIL, 5-FU (flure oh YOOR a sil) is a chemotherapy drug. It slows the growth of cancer cells. This medicine is used to treat many types of cancer like breast cancer, colon or rectal cancer, pancreatic cancer, and stomach cancer. This medicine may be used for other purposes; ask your health care provider or pharmacist if you have questions. COMMON BRAND NAME(S): Adrucil What should I tell my health care provider before I take this medicine? They need to know if you have any of these conditions: -blood disorders -dihydropyrimidine dehydrogenase (DPD) deficiency -infection (especially a virus infection such as chickenpox, cold sores, or herpes) -kidney disease -liver disease -malnourished, poor nutrition -recent or ongoing radiation therapy -an unusual or allergic reaction to fluorouracil, other chemotherapy, other medicines, foods, dyes, or preservatives -pregnant or trying to get pregnant -breast-feeding How should I use this medicine? This drug is given as an infusion or injection into a vein. It is administered in a hospital or clinic by a specially trained health care professional. Talk to your pediatrician regarding the use of this medicine in children. Special care may be needed. Overdosage: If you think you have taken too much of this medicine contact a poison control center or emergency room at once. NOTE: This medicine is only for you. Do not share this medicine with others. What if I miss a dose? It is important not to miss your dose. Call your doctor or health care professional if you are unable to keep an appointment. What may interact with this medicine? -allopurinol -cimetidine -dapsone -digoxin -hydroxyurea -leucovorin -levamisole -medicines for seizures like ethotoin,  fosphenytoin, phenytoin -medicines to increase blood counts like filgrastim, pegfilgrastim, sargramostim -medicines that treat or prevent blood clots like warfarin, enoxaparin, and dalteparin -methotrexate -metronidazole -pyrimethamine -some other chemotherapy drugs like busulfan, cisplatin, estramustine, vinblastine -trimethoprim -trimetrexate -vaccines Talk to your doctor or health care professional before taking any of these medicines: -acetaminophen -aspirin -ibuprofen -ketoprofen -naproxen This list may not describe all possible interactions. Give your health care provider a list of all the medicines, herbs, non-prescription drugs, or dietary supplements you use. Also tell them if you smoke, drink alcohol, or use illegal drugs. Some items may interact with your medicine. What should I watch for while using this medicine? Visit your doctor for checks on your progress. This drug may make you feel generally unwell. This is not uncommon, as chemotherapy can affect healthy cells as well as cancer cells. Report any side effects. Continue your course of treatment even though you feel ill unless your doctor tells you to stop. In some cases, you may be given additional medicines to help with side effects. Follow all directions for their use. Call your doctor or health care professional for advice if you get a fever, chills or sore throat, or other symptoms of a cold or flu. Do not treat yourself. This drug decreases your body's ability to fight infections. Try to avoid being around people who are sick. This medicine may increase your risk to bruise or bleed. Call your doctor or health care professional if you notice any unusual bleeding. Be careful brushing and flossing your teeth or using a toothpick because you may get an infection or bleed more easily. If you have any dental work done, tell your dentist you are receiving this medicine. Avoid taking products that contain aspirin, acetaminophen,  ibuprofen, naproxen, or  ketoprofen unless instructed by your doctor. These medicines may hide a fever. Do not become pregnant while taking this medicine. Women should inform their doctor if they wish to become pregnant or think they might be pregnant. There is a potential for serious side effects to an unborn child. Talk to your health care professional or pharmacist for more information. Do not breast-feed an infant while taking this medicine. Men should inform their doctor if they wish to father a child. This medicine may lower sperm counts. Do not treat diarrhea with over the counter products. Contact your doctor if you have diarrhea that lasts more than 2 days or if it is severe and watery. This medicine can make you more sensitive to the sun. Keep out of the sun. If you cannot avoid being in the sun, wear protective clothing and use sunscreen. Do not use sun lamps or tanning beds/booths. What side effects may I notice from receiving this medicine? Side effects that you should report to your doctor or health care professional as soon as possible: -allergic reactions like skin rash, itching or hives, swelling of the face, lips, or tongue -low blood counts - this medicine may decrease the number of white blood cells, red blood cells and platelets. You may be at increased risk for infections and bleeding. -signs of infection - fever or chills, cough, sore throat, pain or difficulty passing urine -signs of decreased platelets or bleeding - bruising, pinpoint red spots on the skin, black, tarry stools, blood in the urine -signs of decreased red blood cells - unusually weak or tired, fainting spells, lightheadedness -breathing problems -changes in vision -chest pain -mouth sores -nausea and vomiting -pain, swelling, redness at site where injected -pain, tingling, numbness in the hands or feet -redness, swelling, or sores on hands or feet -stomach pain -unusual bleeding Side effects that usually  do not require medical attention (report to your doctor or health care professional if they continue or are bothersome): -changes in finger or toe nails -diarrhea -dry or itchy skin -hair loss -headache -loss of appetite -sensitivity of eyes to the light -stomach upset -unusually teary eyes This list may not describe all possible side effects. Call your doctor for medical advice about side effects. You may report side effects to FDA at 1-800-FDA-1088. Where should I keep my medicine? This drug is given in a hospital or clinic and will not be stored at home. NOTE: This sheet is a summary. It may not cover all possible information. If you have questions about this medicine, talk to your doctor, pharmacist, or health care provider.  2014, [employer] Standard. (2007-07-20 13:53:16)  Oxaliplatin Injection What is this medicine? OXALIPLATIN (ox AL i PLA tin) is a chemotherapy drug. It targets fast dividing cells, like cancer cells, and causes these cells to die. This medicine is used to treat cancers of the colon and rectum, and many other cancers. This medicine may be used for other purposes; ask your health care provider or pharmacist if you have questions. COMMON BRAND NAME(S): Eloxatin What should I tell my health care provider before I take this medicine? They need to know if you have any of these conditions: -kidney disease -an unusual or allergic reaction to oxaliplatin, other chemotherapy, other medicines, foods, dyes, or preservatives -pregnant or trying to get pregnant -breast-feeding How should I use this medicine? This drug is given as an infusion into a vein. It is administered in a hospital or clinic by a specially trained health care professional. Talk to  your pediatrician regarding the use of this medicine in children. Special care may be needed. Overdosage: If you think you have taken too much of this medicine contact a poison control center or emergency room at  once. NOTE: This medicine is only for you. Do not share this medicine with others. What if I miss a dose? It is important not to miss a dose. Call your doctor or health care professional if you are unable to keep an appointment. What may interact with this medicine? -medicines to increase blood counts like filgrastim, pegfilgrastim, sargramostim -probenecid -some antibiotics like amikacin, gentamicin, neomycin, polymyxin B, streptomycin, tobramycin -zalcitabine Talk to your doctor or health care professional before taking any of these medicines: -acetaminophen -aspirin -ibuprofen -ketoprofen -naproxen This list may not describe all possible interactions. Give your health care provider a list of all the medicines, herbs, non-prescription drugs, or dietary supplements you use. Also tell them if you smoke, drink alcohol, or use illegal drugs. Some items may interact with your medicine. What should I watch for while using this medicine? Your condition will be monitored carefully while you are receiving this medicine. You will need important blood work done while you are taking this medicine. This medicine can make you more sensitive to cold. Do not drink cold drinks or use ice. Cover exposed skin before coming in contact with cold temperatures or cold objects. When out in cold weather wear warm clothing and cover your mouth and nose to warm the air that goes into your lungs. Tell your doctor if you get sensitive to the cold. This drug may make you feel generally unwell. This is not uncommon, as chemotherapy can affect healthy cells as well as cancer cells. Report any side effects. Continue your course of treatment even though you feel ill unless your doctor tells you to stop. In some cases, you may be given additional medicines to help with side effects. Follow all directions for their use. Call your doctor or health care professional for advice if you get a fever, chills or sore throat, or other  symptoms of a cold or flu. Do not treat yourself. This drug decreases your body's ability to fight infections. Try to avoid being around people who are sick. This medicine may increase your risk to bruise or bleed. Call your doctor or health care professional if you notice any unusual bleeding. Be careful brushing and flossing your teeth or using a toothpick because you may get an infection or bleed more easily. If you have any dental work done, tell your dentist you are receiving this medicine. Avoid taking products that contain aspirin, acetaminophen, ibuprofen, naproxen, or ketoprofen unless instructed by your doctor. These medicines may hide a fever. Do not become pregnant while taking this medicine. Women should inform their doctor if they wish to become pregnant or think they might be pregnant. There is a potential for serious side effects to an unborn child. Talk to your health care professional or pharmacist for more information. Do not breast-feed an infant while taking this medicine. Call your doctor or health care professional if you get diarrhea. Do not treat yourself. What side effects may I notice from receiving this medicine? Side effects that you should report to your doctor or health care professional as soon as possible: -allergic reactions like skin rash, itching or hives, swelling of the face, lips, or tongue -low blood counts - This drug may decrease the number of white blood cells, red blood cells and platelets. You may be  at increased risk for infections and bleeding. -signs of infection - fever or chills, cough, sore throat, pain or difficulty passing urine -signs of decreased platelets or bleeding - bruising, pinpoint red spots on the skin, black, tarry stools, nosebleeds -signs of decreased red blood cells - unusually weak or tired, fainting spells, lightheadedness -breathing problems -chest pain, pressure -cough -diarrhea -jaw tightness -mouth sores -nausea and  vomiting -pain, swelling, redness or irritation at the injection site -pain, tingling, numbness in the hands or feet -problems with balance, talking, walking -redness, blistering, peeling or loosening of the skin, including inside the mouth -trouble passing urine or change in the amount of urine Side effects that usually do not require medical attention (report to your doctor or health care professional if they continue or are bothersome): -changes in vision -constipation -hair loss -loss of appetite -metallic taste in the mouth or changes in taste -stomach pain This list may not describe all possible side effects. Call your doctor for medical advice about side effects. You may report side effects to FDA at 1-800-FDA-1088. Where should I keep my medicine? This drug is given in a hospital or clinic and will not be stored at home. NOTE: This sheet is a summary. It may not cover all possible information. If you have questions about this medicine, talk to your doctor, pharmacist, or health care provider.  2014, [employer]/Gold Standard. (2007-10-11 17:22:47)  Leucovorin injection What is this medicine? LEUCOVORIN (loo koe VOR in) is used to prevent or treat the harmful effects of some medicines. This medicine is used to treat anemia caused by a low amount of folic acid in the body. It is also used with 5-fluorouracil (5-FU) to treat colon cancer. This medicine may be used for other purposes; ask your health care provider or pharmacist if you have questions. What should I tell my health care provider before I take this medicine? They need to know if you have any of these conditions: -anemia from low levels of vitamin B-12 in the blood -an unusual or allergic reaction to leucovorin, folic acid, other medicines, foods, dyes, or preservatives -pregnant or trying to get pregnant -breast-feeding How should I use this medicine? This medicine is for injection into a muscle or into a vein. It is  given by a health care professional in a hospital or clinic setting. Talk to your pediatrician regarding the use of this medicine in children. Special care may be needed. Overdosage: If you think you have taken too much of this medicine contact a poison control center or emergency room at once. NOTE: This medicine is only for you. Do not share this medicine with others. What if I miss a dose? This does not apply. What may interact with this medicine? -capecitabine -fluorouracil -phenobarbital -phenytoin -primidone -trimethoprim-sulfamethoxazole This list may not describe all possible interactions. Give your health care provider a list of all the medicines, herbs, non-prescription drugs, or dietary supplements you use. Also tell them if you smoke, drink alcohol, or use illegal drugs. Some items may interact with your medicine. What should I watch for while using this medicine? Your condition will be monitored carefully while you are receiving this medicine. This medicine may increase the side effects of 5-fluorouracil, 5-FU. Tell your doctor or health care professional if you have diarrhea or mouth sores that do not get better or that get worse. What side effects may I notice from receiving this medicine? Side effects that you should report to your doctor or health care professional as  soon as possible: -allergic reactions like skin rash, itching or hives, swelling of the face, lips, or tongue -breathing problems -fever, infection -mouth sores -unusual bleeding or bruising -unusually weak or tired Side effects that usually do not require medical attention (report to your doctor or health care professional if they continue or are bothersome): -constipation or diarrhea -loss of appetite -nausea, vomiting This list may not describe all possible side effects. Call your doctor for medical advice about side effects. You may report side effects to FDA at 1-800-FDA-1088. Where should I keep my  medicine? This drug is given in a hospital or clinic and will not be stored at home. NOTE: This sheet is a summary. It may not cover all possible information. If you have questions about this medicine, talk to your doctor, pharmacist, or health care provider.  2014, [employer] Standard. (2007-09-20 16:50:29)  Capecitabine tablets What is this medicine? CAPECITABINE (ka pe SITE a been) is a chemotherapy drug. It slows the growth of cancer cells. This medicine is used to treat breast cancer, and also colon or rectal cancer. This medicine may be used for other purposes; ask your health care provider or pharmacist if you have questions. COMMON BRAND NAME(S): Xeloda What should I tell my health care provider before I take this medicine? They need to know if you have any of these conditions: -bleeding or blood disorders -dihydropyrimidine dehydrogenase (DPD) deficiency -heart disease -infection (especially a virus infection such as chickenpox, cold sores, or herpes) -kidney disease -liver disease -an unusual or allergic reaction to capecitabine, 5-fluorouracil, other medicines, foods, dyes, or preservatives -pregnant or trying to get pregnant -breast-feeding How should I use this medicine? Take this medicine by mouth with a glass of water, within 30 minutes of the end of a meal. Follow the directions on the prescription label. Take your medicine at regular intervals. Do not take it more often than directed. Do not stop taking except on your doctor's advice. Your doctor may want you to take a combination of 150 mg and 500 mg tablets for each dose. It is very important that you know how to correctly take your dose. Taking the wrong tablets could result in an overdose (too much medication) or underdose (too little medication). Talk to your pediatrician regarding the use of this medicine in children. Special care may be needed. Overdosage: If you think you have taken too much of this medicine  contact a poison control center or emergency room at once. NOTE: This medicine is only for you. Do not share this medicine with others. What if I miss a dose? If you miss a dose, do not take the missed dose at all. Do not take double or extra doses. Instead, continue with your next scheduled dose and check with your doctor. What may interact with this medicine? -antacids with aluminum and/or magnesium -folic acid -leucovorin -medicines to increase blood counts like filgrastim, pegfilgrastim, sargramostim -phenytoin -vaccines -warfarin Talk to your doctor or health care professional before taking any of these medicines: -acetaminophen -aspirin -ibuprofen -ketoprofen -naproxen This list may not describe all possible interactions. Give your health care provider a list of all the medicines, herbs, non-prescription drugs, or dietary supplements you use. Also tell them if you smoke, drink alcohol, or use illegal drugs. Some items may interact with your medicine. What should I watch for while using this medicine? Visit your doctor for checks on your progress. This drug may make you feel generally unwell. This is not uncommon, as chemotherapy can affect  healthy cells as well as cancer cells. Report any side effects. Continue your course of treatment even though you feel ill unless your doctor tells you to stop. In some cases, you may be given additional medicines to help with side effects. Follow all directions for their use. Call your doctor or health care professional for advice if you get a fever, chills or sore throat, or other symptoms of a cold or flu. Do not treat yourself. This drug decreases your body's ability to fight infections. Try to avoid being around people who are sick. This medicine may increase your risk to bruise or bleed. Call your doctor or health care professional if you notice any unusual bleeding. Be careful brushing and flossing your teeth or using a toothpick because you  may get an infection or bleed more easily. If you have any dental work done, tell your dentist you are receiving this medicine. Avoid taking products that contain aspirin, acetaminophen, ibuprofen, naproxen, or ketoprofen unless instructed by your doctor. These medicines may hide a fever. Do not become pregnant while taking this medicine. Women should inform their doctor if they wish to become pregnant or think they might be pregnant. There is a potential for serious side effects to an unborn child. Talk to your health care professional or pharmacist for more information. Do not breast-feed an infant while taking this medicine. Men are advised not to father a child while taking this medicine. What side effects may I notice from receiving this medicine? Side effects that you should report to your doctor or health care professional as soon as possible: -allergic reactions like skin rash, itching or hives, swelling of the face, lips, or tongue -low blood counts - this medicine may decrease the number of white blood cells, red blood cells and platelets. You may be at increased risk for infections and bleeding. -signs of infection - fever or chills, cough, sore throat, pain or difficulty passing urine -signs of decreased platelets or bleeding - bruising, pinpoint red spots on the skin, black, tarry stools, blood in the urine -signs of decreased red blood cells - unusually weak or tired, fainting spells, lightheadedness -breathing problems -changes in vision -chest pain -diarrhea of more than 4 bowel movements in one day or any diarrhea at night -mouth sores -nausea and vomiting -pain, swelling, redness at site where injected -pain, tingling, numbness in the hands or feet -redness, swelling, or sores on hands or feet -stomach pain -vomiting -yellow color of skin or eyes Side effects that usually do not require medical attention (report to your doctor or health care professional if they continue or  are bothersome): -constipation -diarrhea -dry or itchy skin -hair loss -loss of appetite -nausea -weak or tired This list may not describe all possible side effects. Call your doctor for medical advice about side effects. You may report side effects to FDA at 1-800-FDA-1088. Where should I keep my medicine? Keep out of the reach of children. Store at room temperature between 15 and 30 degrees C (59 and 86 degrees F). Keep container tightly closed. Throw away any unused medicine after the expiration date. NOTE: This sheet is a summary. It may not cover all possible information. If you have questions about this medicine, talk to your doctor, pharmacist, or health care provider.  2014, [employer] Standard. (2008-02-27 11:47:41)

## 2013-06-23 ENCOUNTER — Ambulatory Visit: Payer: 59 | Admitting: Nutrition

## 2013-06-23 NOTE — Progress Notes (Signed)
Nutrition followup completed with patient.  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  Weight has decreased to 111.4 pounds documented March 25.  Patient is trying to follow a higher calorie, lower fiber diet.  Patient is interested in estimated nutrition needs for weight gain.  Nutrition diagnosis: Unintended weight loss continues.  Intervention: Patient was educated to increase calories and protein in 6 small meals and snacks daily.  Educated patient to consume minimum of 1600-1800 calories for weight maintenance and add an additional 500 calories daily to promote weight gain.  Patient given fact sheets on ways to add calories and protein.  Also provided patient with samples of oral nutrition supplements.  Purchase information provided.  Questions were answered.  Teach back method used.  Monitoring, evaluation, goals: Patient will tolerate increased oral intake to minimize further weight loss and promote depletion of lean body mass.  Next visit: Patient will contact me for followup as needed.

## 2013-06-26 ENCOUNTER — Other Ambulatory Visit: Payer: Self-pay | Admitting: Internal Medicine

## 2013-06-28 NOTE — Telephone Encounter (Signed)
Ok to refill x 6 months 

## 2013-07-06 ENCOUNTER — Telehealth: Payer: Self-pay | Admitting: *Deleted

## 2013-07-06 NOTE — Telephone Encounter (Signed)
Received voice message from Bussey, sister, that they want to see Dr. Humphrey Rolls next week. Patient already has an appointment with Mendel Ryder, NP, on 07/19/13. I left a voice message on Rosemary's phone, [phone], informing her that Dr. Humphrey Rolls was out of the office this week. Also, informed them that the desk nurse doesn't control the doctors schedule. Dr. Humphrey Rolls will give me a date and time she can see the patient. If she had any questions, please call [phone].

## 2013-07-11 ENCOUNTER — Telehealth: Payer: Self-pay

## 2013-07-11 NOTE — Telephone Encounter (Signed)
Patient can see me this week but we need to move a patient out of my schedule to [facility] or she can see me on 4/21 at 1:30

## 2013-07-11 NOTE — Telephone Encounter (Signed)
Pt was requesting appt time - did not have record of her 4/22 appt.  Let pt know d/t for lab and MD.  Pt voiced understanding.

## 2013-07-18 ENCOUNTER — Telehealth: Payer: Self-pay | Admitting: *Deleted

## 2013-07-18 NOTE — Telephone Encounter (Signed)
Called pt to inform her about Dr. Humphrey Rolls and that Mendel Ryder would not be able to see her tomorrow.  Pt is very unhappy, but has agreed to see Lattie Haw and Dr. Benay Spice. Confirmed 07/19/13 appt w/ pt.

## 2013-07-19 ENCOUNTER — Other Ambulatory Visit (HOSPITAL_BASED_OUTPATIENT_CLINIC_OR_DEPARTMENT_OTHER): Payer: 59

## 2013-07-19 ENCOUNTER — Ambulatory Visit (HOSPITAL_BASED_OUTPATIENT_CLINIC_OR_DEPARTMENT_OTHER): Payer: 59 | Admitting: Nurse Practitioner

## 2013-07-19 ENCOUNTER — Telehealth: Payer: Self-pay | Admitting: *Deleted

## 2013-07-19 ENCOUNTER — Telehealth: Payer: Self-pay | Admitting: Oncology

## 2013-07-19 VITALS — BP 137/67 | HR 93 | Temp 97.9°F | Resp 19 | Ht 64.0 in | Wt 125.6 lb

## 2013-07-19 DIAGNOSIS — C20 Malignant neoplasm of rectum: Secondary | ICD-10-CM

## 2013-07-19 DIAGNOSIS — C7800 Secondary malignant neoplasm of unspecified lung: Secondary | ICD-10-CM

## 2013-07-19 DIAGNOSIS — Z452 Encounter for adjustment and management of vascular access device: Secondary | ICD-10-CM

## 2013-07-19 LAB — CBC WITH DIFFERENTIAL/PLATELET
BASO%: 1.3 % (ref 0.0–2.0)
Basophils Absolute: 0.1 10*3/uL (ref 0.0–0.1)
EOS%: 4.2 % (ref 0.0–7.0)
Eosinophils Absolute: 0.2 10*3/uL (ref 0.0–0.5)
HEMATOCRIT: 34.2 % — AB (ref 34.8–46.6)
HGB: 11.3 g/dL — ABNORMAL LOW (ref 11.6–15.9)
LYMPH%: 13 % — AB (ref 14.0–49.7)
MCH: 29.3 pg (ref 25.1–34.0)
MCHC: 33 g/dL (ref 31.5–36.0)
MCV: 88.8 fL (ref 79.5–101.0)
MONO#: 0.4 10*3/uL (ref 0.1–0.9)
MONO%: 8.3 % (ref 0.0–14.0)
NEUT#: 3.4 10*3/uL (ref 1.5–6.5)
NEUT%: 73.2 % (ref 38.4–76.8)
PLATELETS: 311 10*3/uL (ref 145–400)
RBC: 3.85 10*6/uL (ref 3.70–5.45)
RDW: 13.6 % (ref 11.2–14.5)
WBC: 4.7 10*3/uL (ref 3.9–10.3)
lymph#: 0.6 10*3/uL — ABNORMAL LOW (ref 0.9–3.3)

## 2013-07-19 LAB — COMPREHENSIVE METABOLIC PANEL (CC13)
ALT: 13 U/L (ref 0–55)
ANION GAP: 11 meq/L (ref 3–11)
AST: 12 U/L (ref 5–34)
Albumin: 3.9 g/dL (ref 3.5–5.0)
Alkaline Phosphatase: 72 U/L (ref 40–150)
BUN: 19.4 mg/dL (ref 7.0–26.0)
CO2: 26 meq/L (ref 22–29)
CREATININE: 0.7 mg/dL (ref 0.6–1.1)
Calcium: 10.4 mg/dL (ref 8.4–10.4)
Chloride: 106 mEq/L (ref 98–109)
GLUCOSE: 118 mg/dL (ref 70–140)
Potassium: 4.3 mEq/L (ref 3.5–5.1)
Sodium: 143 mEq/L (ref 136–145)
Total Bilirubin: 0.25 mg/dL (ref 0.20–1.20)
Total Protein: 6.9 g/dL (ref 6.4–8.3)

## 2013-07-19 LAB — BASIC METABOLIC PANEL (CC13)
Anion Gap: 10 mEq/L (ref 3–11)
BUN: 20 mg/dL (ref 7.0–26.0)
CHLORIDE: 108 meq/L (ref 98–109)
CO2: 23 mEq/L (ref 22–29)
Calcium: 10.2 mg/dL (ref 8.4–10.4)
Creatinine: 0.7 mg/dL (ref 0.6–1.1)
Glucose: 149 mg/dl — ABNORMAL HIGH (ref 70–140)
Potassium: 4.1 mEq/L (ref 3.5–5.1)
SODIUM: 141 meq/L (ref 136–145)

## 2013-07-19 MED ORDER — SODIUM CHLORIDE 0.9 % IJ SOLN
10.0000 mL | INTRAMUSCULAR | Status: DC | PRN
  Administered 2013-07-19: 10 mL via INTRAVENOUS
  Filled 2013-07-19: qty 10

## 2013-07-19 MED ORDER — HEPARIN SOD (PORK) LOCK FLUSH 100 UNIT/ML IV SOLN
500.0000 [IU] | Freq: Once | INTRAVENOUS | Status: AC
  Administered 2013-07-19: 500 [IU] via INTRAVENOUS
  Filled 2013-07-19: qty 5

## 2013-07-19 NOTE — Patient Instructions (Signed)

## 2013-07-19 NOTE — Progress Notes (Addendum)
[place] OFFICE PROGRESS NOTE   Diagnosis:  Metastatic rectal cancer.  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 Initial diagnosis dates to July 2014 at which time she presented with painless rectal bleeding. Colonoscopy on 10/25/2012 showed a bulky circumferential friable mass lesion in the rectum at approximately 4 cm above the dentate line. The mass was subtotally obstructing and would not permit passage of the colonoscope proximal. Biopsy showed adenocarcinoma.   Staging CT scans 10/25/2012 showed a right lower lobe lung mass measuring 5 x 4.6 cm; 0.8 cm nodule right upper lobe; masslike rectosigmoid wall thickening, surrounding stranding and adjacent lymphadenopathy.  PET scan 11/02/2012 showed hypermetabolism in the area of apparent circumferential rectal wall thickening; the 5 cm right lower lobe pulmonary mass was hypermetabolic with SUV max equal 8. No other hypermetabolic lung lesions were evident. Small bilateral  pulmonary parenchymal nodules were noted.  She completed 5 cycles of FOLFOX chemotherapy 11/22/2012 through 02/13/2013.  She was hospitalized 12/16/2012 with a progressive loculated pleural fluid collection right hemithorax. She underwent a right VATS, drainage of pleural effusion, visceral and parietal pleural decortication by Dr. Roxan Hockey on 12/21/2012. The pleural fluid returned positive for viridans streptococcus. Biopsy of the pleura was negative for malignancy with findings favoring an empyema.  On 02/27/2013 she underwent a right thoracotomy, bilobectomy (right lower and middle lobectomy) with en bloc resection of the diaphragm. Pathology showed adenocarcinoma consistent with metastatic colorectal adenocarcinoma, 4.3 cm, negative margins, no lymph node involvement. Negative soft tissue biopsy of the diaphragm x2.  She completed concurrent radiation and Xeloda for treatment of the rectal tumor 03/06/2013  through 04/11/2013.  She was evaluated in the emergency Department for abdominal pain on 05/12/2013. CT of the abdomen/pelvis showed lung nodules had increased in size; there was a chronic loculated right base pneumothorax; there was diffuse mucosal thickening of the sigmoid portion of the colon.  She underwent a low anterior resection with a diverting loop ileostomy on 05/31/2013 at [facility] in [place]. Final pathology showed moderately differentiated adenocarcinoma located in the upper rectum; the tumor measured 2.5 x 2.2 cm; there was invasion through the muscularis propria into pericolic adipose tissue; tumor budding was present. Lymphatic status negative; vascular status negative; proximal and distal surgical margins negative; 2 of 17 lymph nodes showed metastatic adenocarcinoma. Mesenteric tumor deposits were present. Perineural invasion present.  She presents today to discuss further treatment. She is feeling much better than at the time of her last visit approximate 1 month ago. Appetite has improved. She is gaining weight. Energy level is better. She empties the ileostomy bag 4-5 times a day. She takes Imodium. She is tolerating fluids without difficulty. She denies pain. No nausea or vomiting.  Objective:  Vital signs in last 24 hours:  Blood pressure 137/67, pulse 93, temperature 97.9 F (36.6 C), temperature source Oral, resp. rate 19, height _0  (1.626 m), weight 125 lb 9.6 oz (56.972 kg).    HEENT: Tongue is mildly dry. No ulcerations. Lymphatics: No palpable cervical, supraclavicular or axillary lymph nodes. Resp: Breath sounds diminished right lung field. No respiratory distress. Cardio: Regular cardiac rhythm. Faint systolic murmur. GI: Abdomen soft and nontender. Right lower quadrant ileostomy. No hepatomegaly. Vascular: The left lower leg appears slightly larger than the right lower leg. Calves nontender.  Lab Results:  Lab Results  Component Value Date   WBC 4.7  07/19/2013   HGB 11.3* 07/19/2013   HCT 34.2* 07/19/2013  MCV 88.8 07/19/2013   PLT 311 07/19/2013   NEUTROABS 3.4 07/19/2013    Imaging:  No results found.  Medications: I have reviewed the patient's current medications.  Assessment/Plan: 1. Metastatic rectal cancer diagnosed July 2014.   Colonoscopy on 10/25/2012 showed a bulky circumferential friable mass lesion in the rectum at approximately 4 cm above the dentate line. The mass was subtotally obstructing and would not permit passage of the colonoscope proximal. Biopsy showed adenocarcinoma.   Staging CT scans 10/25/2012 showed a right lower lobe lung mass measuring 5 x 4.6 cm; 0.8 cm nodule right upper lobe; masslike rectosigmoid wall thickening, surrounding stranding and adjacent lymphadenopathy.   PET scan 11/02/2012 showed hypermetabolism in the area of apparent circumferential rectal wall thickening; the 5 cm right lower lobe pulmonary mass was hypermetabolic with SUV max equal 8. No other hypermetabolic lung lesions were evident. Small bilateral  pulmonary parenchymal nodules were noted.   She completed 5 cycles of FOLFOX chemotherapy 11/22/2012 through 02/13/2013.   Status post right thoracotomy, bilobectomy (right lower and middle lobectomy) with en bloc resection of the diaphragm 02/27/2013. Pathology showed adenocarcinoma consistent with metastatic colorectal adenocarcinoma, 4.3 cm, negative margins, no lymph node involvement. Negative soft tissue biopsy of the diaphragm x2.   She completed concurrent radiation and Xeloda for treatment of the rectal tumor 03/06/2013 through 04/11/2013.   Status post low anterior resection with a diverting loop ileostomy on 05/31/2013 at [facility] in [place]. Final pathology showed moderately differentiated adenocarcinoma located in the upper rectum; the tumor measured 2.5 x 2.2 cm; there was invasion through the muscularis propria into pericolic adipose tissue; tumor budding was present.  Lymphatic status negative; vascular status negative; proximal and distal surgical margins negative; 2 of 17 lymph nodes showed metastatic adenocarcinoma. Mesenteric tumor deposits were present. Perineural invasion present. 2. CT abdomen/pelvis 05/12/2013. Metastatic nodules in the lung increased. Chronic loculated right base pneumothorax. Diffuse mucosal thickening of the sigmoid portion of the colon. 3. Status post right VATS, drainage of pleural effusion, visceral and parietal pleural decortication 12/21/2012. Pleural fluid culture positive for viridans Streptococcus. Right pleura biopsies negative for malignancy. Findings favored empyema. 4. Diabetes.   Disposition: She appears stable. Her performance status has improved over the past month.   Dr. Benay Spice reviewed the pathology report from the rectal surgery with Dr. [name] and her family. The CT report from 05/12/2013 indicating progressive lung metastases was also reviewed.  Dr. Benay Spice recommends proceeding with a restaging CT evaluation to include the chest, abdomen and pelvis prior to making a treatment recommendation. She is already scheduled for a chest CT on 07/25/2013. We will add the abdomen and pelvis and request IV contrast. We will obtain a CEA today.  She will return for a followup visit with Dr. Benay Spice on 07/27/2013 to review the CT results. She will contact the office in the interim with any problems.   Patient seen with Dr. Benay Spice. CT images were reviewed on the computer. 50 minutes were spent face-to-face at today's visit with the majority of that time involved in counseling/coordination of care.    Owens Shark ANP/GNP-BC   07/19/2013  3:07 PM  This was a shared visit with Ned Card.   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 Treatment history and CTs reviewed.  I am concerned she has metastatic disease in the lungs. She is scheduled to have restaging CTs next week.  We will refer her for the ileostomy  takedown if the CTs confirm metastatic disease.  We  discussed systemic treatment options.  Her case will be presented at the GI tumor conference 4/29.  I will see her for an office visit 4/20. Kras testing will be submitted.   Julieanne Manson, MD

## 2013-07-19 NOTE — Telephone Encounter (Signed)
Per request of Dr. Benay Spice, request sent to pathology to perform extended KRAS testing on Accession: [id number].

## 2013-07-19 NOTE — Telephone Encounter (Signed)
Gave pt appt for lab,md CT and for April 2015

## 2013-07-20 ENCOUNTER — Telehealth: Payer: Self-pay | Admitting: *Deleted

## 2013-07-20 LAB — CEA: CEA: 1.2 ng/mL (ref 0.0–5.0)

## 2013-07-20 NOTE — Telephone Encounter (Signed)
Per order from Dr. Benay Spice, request made to pathology for review of path- case done 05/31/13 at [place].  Spoke with Signa Kell.

## 2013-07-21 ENCOUNTER — Other Ambulatory Visit: Payer: Self-pay | Admitting: *Deleted

## 2013-07-21 ENCOUNTER — Telehealth: Payer: Self-pay | Admitting: *Deleted

## 2013-07-21 NOTE — Telephone Encounter (Signed)
Message copied by Norma Fredrickson on Fri Jul 21, 2013  9:33 AM ------      Message from: Betsy Coder B      Created: Thu Jul 20, 2013  8:17 PM       Please call patient, cea is normal ------

## 2013-07-21 NOTE — Telephone Encounter (Signed)
Called and informed patient of normal cea.  Per Dr. Sherrill.  Patient verbalized understanding.  

## 2013-07-25 ENCOUNTER — Ambulatory Visit (HOSPITAL_COMMUNITY)
Admission: RE | Admit: 2013-07-25 | Discharge: 2013-07-25 | Disposition: A | Discharge status: Home / Self Care | Payer: 59 | Source: Ambulatory Visit | Attending: Nurse Practitioner | Admitting: Nurse Practitioner

## 2013-07-25 ENCOUNTER — Other Ambulatory Visit: Payer: 59

## 2013-07-25 ENCOUNTER — Ambulatory Visit (INDEPENDENT_AMBULATORY_CARE_PROVIDER_SITE_OTHER): Payer: 59 | Admitting: Thoracic Surgery (Cardiothoracic Vascular Surgery)

## 2013-07-25 ENCOUNTER — Encounter: Payer: Self-pay | Admitting: Thoracic Surgery (Cardiothoracic Vascular Surgery)

## 2013-07-25 ENCOUNTER — Other Ambulatory Visit: Payer: Self-pay

## 2013-07-25 VITALS — BP 143/84 | HR 92 | Resp 20 | Ht 64.0 in | Wt 125.0 lb

## 2013-07-25 DIAGNOSIS — C20 Malignant neoplasm of rectum: Secondary | ICD-10-CM

## 2013-07-25 DIAGNOSIS — C7800 Secondary malignant neoplasm of unspecified lung: Secondary | ICD-10-CM

## 2013-07-25 DIAGNOSIS — R222 Localized swelling, mass and lump, trunk: Secondary | ICD-10-CM

## 2013-07-25 DIAGNOSIS — D381 Neoplasm of uncertain behavior of trachea, bronchus and lung: Secondary | ICD-10-CM

## 2013-07-25 MED ORDER — IOHEXOL 300 MG/ML  SOLN
80.0000 mL | Freq: Once | INTRAMUSCULAR | Status: AC | PRN
  Administered 2013-07-25: 80 mL via INTRAVENOUS

## 2013-07-25 NOTE — Progress Notes (Signed)
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 She had at the time a solitary lung metastasis. She underwent neoadjuvant chemotherapy and developed a right empyema which we drained thoracoscopically. She then recovered from that and subsequently underwent a right lower and middle lobectomy to resect a large lung metastasis. This required some soft tissue resection of the chest wall and a portion of the diaphragm. We did have negative surgical margins. She recently had a low anterior resection with a diverting ileostomy in [place].  She had a CT scan in the emergency room in February which showed possible growth of a nodule in the right upper lobe which had been negative on PET prior to her thoracic procedure. It was hard to be certain due to distortion of the architecture postoperatively. I recommended to her that time she go ahead with a low anterior resection and have a repeat scan at 8 weeks.  She did well with his surgery although the ileostomy is aggravating for her. She's not having any problems with her breathing. She had a CEA drawn which was normal about a week ago. Today she had a repeat CT of the chest. The abdomen and pelvis were also scanned.  Past Medical History  Diagnosis Date  . Postmenopausal HRT (hormone replacement therapy) 07/01/2011    surgical menopause  . Hx of hyperglycemia 07/01/2011  . Hx of compression fracture of spine 1985    t12 after a  20 foot fall  . Hx of fracture of wrist     after fall   . 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 Hx of colonic polyps   . Early cataract     stoneburner   . High frequency hearing loss     [facility]raus followed   . Diabetes mellitus without complication   . Complication of anesthesia     pt hadpanic attacks with versed, no versed  . Status post chemotherapy     completed a total of three cycles of  Folfox   . Colon cancer     rectal cancer with lung metastasis  . Skin cancer     multiple  . Heart murmur, systolic 08/05/5275    innocent per Dr. Gershon Mussel   . Asthma     as a child none since 73 years old  . OEUMPNTI(144.3)       Current Outpatient Prescriptions  Medication Sig Dispense Refill  . Calcium Carbonate (CALCIUM 600 PO) Take 1 tablet by mouth daily.       Marland Kitchen estradiol (MINIVELLE) 0.0375 MG/24HR Place 1 patch onto the skin 2 (two) times a week. Sundays and Thursdays      . JANUVIA 100 MG tablet TAKE ONE TABLET EACH DAY  30 tablet  2  . metFORMIN (GLUCOPHAGE) 1000 MG tablet TAKE ONE TABLET TWICE DAILY WITH A MEAL  180 tablet  1  . methylTESTOSTERone (ANDROID) 10 MG capsule Take 10 mg by mouth.      . Multiple Vitamin (MULTIVITAMIN) tablet Take 1 tablet by mouth daily. Senior      . omeprazole (PRILOSEC) 40 MG capsule Take 1 capsule (40 mg total) by mouth daily.  30 capsule  6  . OVER THE COUNTER MEDICATION Place 1 drop into both ears 2 (two) times a week.      . Probiotic Product (SOLUBLE FIBER/PROBIOTICS PO) Take 1  tablet by mouth daily.       No current facility-administered medications for this visit.    Physical Exam BP 143/84  Pulse 92  Resp 20  Ht $R'5\' 4"'ki$  (1.626 m)  Wt 125 lb (56.7 kg)  BMI 21.45 kg/m2  SpO42 26% 68 year old woman in no acute distress She has gained weight since her last visit Lungs clear with surprisingly good breath sounds on the right No palpable cervical or supraclavicular adenopathy Incision well-healed  Diagnostic Tests: CT of chest abdomen and pelvis FINDINGS:  CT CHEST FINDINGS  Chronic, right-sided pneumothorax is again identified. Index nodule  in the right lower lobe measures 1.2 cm, image 26/series 3.  Previously 1.1 cm. Pulmonary nodule in the left base measured 7 mm,  image 33/ series 3. Previously 5 mm. Subpleural nodule in the left  upper lobe measures 6 mm, image 24/ series 3. Previously 4 mm. New  right upper lung nodule  measures 1.2 cm, image 17/series 3. Next  The heart size appears normal. There is no pericardial effusion. No  enlarged mediastinal or hilar adenopathy.  There is no supraclavicular or axillary adenopathy.  Review of the visualized osseous structures is negative for  aggressive lytic or sclerotic bone lesion. Stable T12 full wedge  deformity  CT ABDOMEN AND PELVIS FINDINGS  No focal liver abnormality. Gallbladder appears normal. No  significant biliary dilatation. Normal appearance of the pancreas.  The spleen is unremarkable.  The adrenal glands are both normal. Punctate stone is identified  within the upper pole of the right kidney. The left kidney is  unremarkable. The urinary bladder appears normal.  Calcified atherosclerotic disease affects the abdominal aorta. There  are no pathologically enlarged lymph nodes within the upper abdomen.  No enlarged pelvic or inguinal lymph nodes.  The stomach appears normal. The small bowel loops have a normal  course and caliber without evidence for bowel obstruction. Patient  has a right lower quadrant ileostomy. Unremarkable appearance of the  proximal colon  There are postoperative changes involving the lower pelvis and  rectum.  No free fluid or abnormal fluid collections identified within the  abdomen or the pelvis.  Review of the visualized bony structures is significant for mild  multi level degenerative disc disease.  IMPRESSION:  CT chest:  1. Multi focal pulmonary nodules have progressed from previous  examinations  2. Persistent, chronic right loculated pneumothorax.  CT abdomen and pelvis:  1. Right lower quadrant ileostomy without evidence for bowel  obstruction.  2. No evidence for metastatic disease to the abdomen or pelvis.  Electronically Signed  By: Kerby Moors M.D.  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 She had a large met resected with a bilobectomy  in December. She recently had a low anterior resection to treat her primary tumor.  She now has multiple lung nodules that are either new or growing. The largest is in the remnant of the right upper lobe posteriorly. There is a second right upper lobe nodule that was not present before. There are also nodules on the left side including a left lower lobe nodule that has grown in the interim since her last scan.  These findings are very concerning for metastatic cancer. That is the most likely possibility. There is a possibility that this could be infectious that she has been immunosuppressed between chemotherapy and multiple operations, but I think that is far less likely. I think the best way to  proceed at this point would be to do a CT guided needle biopsy of the posterior right upper lobe nodule. I think there is very little risk of dropping the lung in the setting of 2 previous operations including a decortication. Dr. Frutoso Chase is in agreement with that plan   Plan:  CT-guided needle biopsy  I will call her with results  She has an appointment with oncology this Thursday

## 2013-07-27 ENCOUNTER — Other Ambulatory Visit: Payer: Self-pay | Admitting: Oncology

## 2013-07-27 ENCOUNTER — Ambulatory Visit (HOSPITAL_BASED_OUTPATIENT_CLINIC_OR_DEPARTMENT_OTHER): Payer: 59 | Admitting: Oncology

## 2013-07-27 VITALS — BP 142/72 | HR 97 | Temp 97.8°F | Resp 18 | Ht 64.0 in | Wt 124.2 lb

## 2013-07-27 DIAGNOSIS — E119 Type 2 diabetes mellitus without complications: Secondary | ICD-10-CM

## 2013-07-27 DIAGNOSIS — C7800 Secondary malignant neoplasm of unspecified lung: Secondary | ICD-10-CM

## 2013-07-27 DIAGNOSIS — C20 Malignant neoplasm of rectum: Secondary | ICD-10-CM

## 2013-07-27 NOTE — Progress Notes (Signed)
[place] OFFICE PROGRESS NOTE   Diagnosis: Rectal cancer  INTERVAL HISTORY:   She returns as scheduled. No complaint. She continues to treat fluid to keep up with the ileostomy output.  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 Roxan Hockey earlier this week. They reviewed the restaging CT scans and discussed a biopsy of one of the lung lesions.  Objective:  Vital signs in last 24 hours:  Blood pressure 142/72, pulse 97, temperature 97.8 F (36.6 C), temperature source Oral, resp. rate 18, height 5\' 4"  (1.626 m), weight 124 lb 3.2 oz (56.337 kg), SpO2 100.00%.   Resp: Lungs clear bilaterally Cardio: Regular rate and rhythm GI: No hepatomegaly, liquid stool in the ileostomy bag Vascular: No leg edema  Portacath/PICC-without erythema  Lab Results:  Lab Results  Component Value Date   WBC 4.7 07/19/2013   HGB 11.3* 07/19/2013   HCT 34.2* 07/19/2013   MCV 88.8 07/19/2013   PLT 311 07/19/2013   NEUTROABS 3.4 07/19/2013     Lab Results  Component Value Date   CEA 1.2 07/19/2013    Imaging:  CTs of the chest, abdomen, and pelvis on 07/17/2013, compared to 05/02/2013 and 02/10/2013: Enlargement of bilateral pulmonary nodules, persistent right loculated pneumothorax, no evidence of metastatic disease in the abdomen or pelvis Medications: I have reviewed the patient's current medications.  Assessment/Plan: 1. Metastatic rectal cancer diagnosed July 2014.  Colonoscopy on 10/25/2012 showed a bulky circumferential friable mass lesion in the rectum at approximately 4 cm above the dentate line. The mass was subtotally obstructing and would not permit passage of the colonoscope proximal. Biopsy showed adenocarcinoma.  Staging CT scans 10/25/2012 showed a right lower lobe lung mass measuring 5 x 4.6 cm; 0.8 cm nodule right upper lobe; masslike rectosigmoid wall thickening, surrounding stranding and adjacent lymphadenopathy.  PET scan 11/02/2012 showed hypermetabolism in the area of apparent  circumferential rectal wall thickening; the 5 cm right lower lobe pulmonary mass was hypermetabolic with SUV max equal 8. No other hypermetabolic lung lesions were evident. Small bilateral pulmonary parenchymal nodules were noted.  She completed 5 cycles of FOLFOX chemotherapy 11/22/2012 through 02/13/2013.  Status post right thoracotomy, bilobectomy (right lower and middle lobectomy) with en bloc resection of the diaphragm 02/27/2013. Pathology showed adenocarcinoma consistent with metastatic colorectal adenocarcinoma, 4.3 cm, negative margins, no lymph node involvement. Negative soft tissue biopsy of the diaphragm x2.  She completed concurrent radiation and Xeloda for treatment of the rectal tumor 03/06/2013 through 04/11/2013.  Status post low anterior resection with a diverting loop ileostomy on 05/31/2013 at [facility] in [place]. Final pathology showed moderately differentiated adenocarcinoma located in the upper rectum; the tumor measured 2.5 x 2.2 cm; there was invasion through the muscularis propria into pericolic adipose tissue; tumor budding was present. Lymphatic status negative; vascular status negative; proximal and distal surgical margins negative; 2 of 17 lymph nodes showed metastatic adenocarcinoma. Mesenteric tumor deposits were present. Perineural invasion present. [facility] confirmed an additional positive lymph node and the tumor was staged as a T4a CT abdomen/pelvis 05/12/2013. Metastatic nodules in the lung increased. Chronic loculated right base pneumothorax. Diffuse mucosal thickening of the sigmoid portion of the colon. Restaging CTs 07/26/2011 with enlargement of bilateral lung nodules, no other evidence of metastatic disease 2. Status post right VATS, drainage of pleural effusion, visceral and parietal pleural decortication 12/21/2012. Pleural fluid culture positive for viridans Streptococcus. Right pleura biopsies negative for malignancy. Findings favored  empyema. 3. Diabetes.   Disposition:  I reviewed the  restaging CT scans and discussed treatment options with Dr. [name] and her family. I discussed the case with Dr. Roxan Hockey. Her case was presented at the GI tumor conference 07/26/2013.  She has metastatic rectal cancer. It is very likely the lung nodules represent progression of the rectal cancer. I discussed this likelihood with Dr. [name] and she is comfortable not proceeding with a lung biopsy.  No therapy will be curative. I recommend proceeding with takedown of the ileostomy and then considering systemic treatment options. We discussed FOLFIRI, the addition of bevacizumab or panitumumab, and repeat treatment with oxaliplatin-based therapy. She is in agreement. I will contact Dr. Creola Corn. She will return here for an office visit and further discussion 08/31/2013.  Approximately 45 minutes were spent with the patient today. The majority of time was used for counseling and coordination of care.  Ladell Pier, MD  07/27/2013  5:15 PM

## 2013-07-29 ENCOUNTER — Telehealth: Payer: Self-pay | Admitting: Oncology

## 2013-07-29 NOTE — Telephone Encounter (Signed)
S/w the pt and she is aware of her June appt with dr Benay Spice

## 2013-08-28 ENCOUNTER — Other Ambulatory Visit: Payer: Self-pay | Admitting: Internal Medicine

## 2013-08-28 ENCOUNTER — Other Ambulatory Visit: Payer: Self-pay | Admitting: Oncology

## 2013-08-29 ENCOUNTER — Other Ambulatory Visit: Payer: Self-pay | Admitting: Internal Medicine

## 2013-08-30 NOTE — Telephone Encounter (Signed)
Ok x 6 months

## 2013-08-30 NOTE — Telephone Encounter (Signed)
Ok to refill x 6 months  Please arrange for her to get HGa1c at some point  When convenient( perhaps when oncology does their blood work .) or after her chemo.

## 2013-08-31 ENCOUNTER — Other Ambulatory Visit: Payer: Self-pay | Admitting: *Deleted

## 2013-08-31 ENCOUNTER — Other Ambulatory Visit (HOSPITAL_COMMUNITY)
Admission: RE | Admit: 2013-08-31 | Discharge: 2013-08-31 | Disposition: A | Discharge status: Home / Self Care | Payer: 59 | Source: Ambulatory Visit | Attending: Oncology | Admitting: Oncology

## 2013-08-31 ENCOUNTER — Telehealth: Payer: Self-pay | Admitting: Oncology

## 2013-08-31 ENCOUNTER — Ambulatory Visit (HOSPITAL_BASED_OUTPATIENT_CLINIC_OR_DEPARTMENT_OTHER): Payer: 59 | Admitting: Oncology

## 2013-08-31 VITALS — BP 156/75 | HR 103 | Temp 98.5°F | Resp 18 | Ht 64.0 in | Wt 133.0 lb

## 2013-08-31 DIAGNOSIS — R000 Tachycardia, unspecified: Secondary | ICD-10-CM

## 2013-08-31 DIAGNOSIS — C7800 Secondary malignant neoplasm of unspecified lung: Secondary | ICD-10-CM | POA: Insufficient documentation

## 2013-08-31 DIAGNOSIS — C20 Malignant neoplasm of rectum: Secondary | ICD-10-CM

## 2013-08-31 DIAGNOSIS — R069 Unspecified abnormalities of breathing: Secondary | ICD-10-CM

## 2013-08-31 MED ORDER — SODIUM CHLORIDE 0.9 % IJ SOLN
10.0000 mL | INTRAMUSCULAR | Status: DC | PRN
  Administered 2013-08-31: 10 mL via INTRAVENOUS
  Filled 2013-08-31: qty 10

## 2013-08-31 MED ORDER — HEPARIN SOD (PORK) LOCK FLUSH 100 UNIT/ML IV SOLN
500.0000 [IU] | Freq: Once | INTRAVENOUS | Status: AC
  Administered 2013-08-31: 500 [IU] via INTRAVENOUS
  Filled 2013-08-31: qty 5

## 2013-08-31 NOTE — Telephone Encounter (Signed)
gv adn printed appts ched and avs for pt for July....sed added tx.

## 2013-08-31 NOTE — Telephone Encounter (Signed)
Sent to the pharmacy by e-scribe. 

## 2013-08-31 NOTE — Progress Notes (Signed)
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 She underwent the ileostomy takedown procedure on 08/14/2013. She reports an uneventful operative recovery. She does have irregular bowel habits. She had frequent bowel movements and then had constipation after taking Lomotil. She now has a small volume stools approximately every 3 hours during the day. No diarrhea. She has noted decreased sphincter tone since childbirth.  She has noted tachycardia since undergoing the lung resection last year. No dyspnea, cough, or chest pain. Dr. [name] plans several vacations over the next month.  Objective:  Vital signs in last 24 hours:  Blood pressure 156/75, pulse 103, temperature 98.5 F (36.9 C), temperature source Oral, resp. rate 18, height $RemoveBe'5\' 4"'sSjuzmprq$  (1.626 m), weight 133 lb (60.328 kg), SpO2 100.00%.    HEENT: Neck without mass Resp: Decreased breath sounds at the right lower chest, end inspiratory rub at the right upper anterior chest, no respiratory distress Cardio: Tachycardia, regular rate and rhythm GI: No hepatomegaly, healing ileostomy wound, nontender Vascular: The left lower leg is slightly larger than the right side (she reports this is chronic)   Portacath/PICC-without erythema  Lab Results:   Lab Results  Component Value Date   CEA 1.2 07/19/2013    Imaging:  No results found.  Medications: I have reviewed the patient's current medications.  Assessment/Plan: 1. Metastatic rectal cancer diagnosed July 2014.  Colonoscopy on 10/25/2012 showed a bulky circumferential friable mass lesion in the rectum at approximately 4 cm above the dentate line. The mass was subtotally obstructing and would not permit passage of the colonoscope proximal. Biopsy showed adenocarcinoma.  Staging CT scans 10/25/2012 showed a right lower lobe lung mass measuring 5 x 4.6 cm; 0.8 cm nodule right upper lobe; masslike  rectosigmoid wall thickening, surrounding stranding and adjacent lymphadenopathy.  PET scan 11/02/2012 showed hypermetabolism in the area of apparent circumferential rectal wall thickening; the 5 cm right lower lobe pulmonary mass was hypermetabolic with SUV max equal 8. No other hypermetabolic lung lesions were evident. Small bilateral pulmonary parenchymal nodules were noted.  She completed 5 cycles of FOLFOX chemotherapy 11/22/2012 through 02/13/2013.  Status post right thoracotomy, bilobectomy (right lower and middle lobectomy) with en bloc resection of the diaphragm 02/27/2013. Pathology showed adenocarcinoma consistent with metastatic colorectal adenocarcinoma, 4.3 cm, negative margins, no lymph node involvement. Negative soft tissue biopsy of the diaphragm x2.  She completed concurrent radiation and Xeloda for treatment of the rectal tumor 03/06/2013 through 04/11/2013.  Status post low anterior resection with a diverting loop ileostomy on 05/31/2013 at [facility] in [place]. Final pathology showed moderately differentiated adenocarcinoma located in the upper rectum; the tumor measured 2.5 x 2.2 cm; there was invasion through the muscularis propria into pericolic adipose tissue; tumor budding was present. Lymphatic status negative; vascular status negative; proximal and distal surgical margins negative; 2 of 17 lymph nodes showed metastatic adenocarcinoma. Mesenteric tumor deposits were present. Perineural invasion present. [facility] an additional positive [facility] node and the tumor was staged as a T4a  CT abdomen/pelvis 05/12/2013. Metastatic nodules in the lung increased. Chronic loculated right base pneumothorax. Diffuse mucosal thickening of the sigmoid portion of the colon.  Restaging CTs 07/26/2011 with enlargement of bilateral lung nodules, no other evidence of metastatic disease Ileostomy takedown 08/14/2013 2. Status post right VATS, drainage of pleural effusion,  visceral and parietal pleural decortication 12/21/2012. Pleural fluid culture positive for viridans Streptococcus. Right pleura biopsies  negative for malignancy. Findings favored empyema. 3. Diabetes. 4. 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 We discussed treatment options for the metastatic rectal cancer. We are waiting on results from K-ras testing. She understands no therapy will be curative. I recommend FOLFIRI/Avastin.  We discussed the potential toxicities associated with the FOLFIRI regimen including a chance for nausea/vomiting, mucositis, diarrhea, alopecia, and hematologic toxicity. We discussed the abdominal pain and severe diarrhea that can be associated with irinotecan. We reviewed the allergic reaction, hypertension, bleeding, delayed wound healing, thromboembolic disease, bowel perforation, and nephrotoxicity associated with Avastin. She agrees to proceed.  Dr. [name] has 2 vacations planned over the next 3-4 weeks. She will return for a baseline chest CT on 10/02/2013. She is scheduled for an office visit and the first cycle of FOLFIRI/Avastin 10/03/2013.  We also discussed the [facility] tissue bank study. She agrees to meet with a Nutritional therapist.  She has tachycardia and a pleural rub today. No dyspnea, cough, or chest pain. I have a low clinical suspicion for pulmonary embolism. She reports the tachycardia is chronic following lung surgery.  Ladell Pier, MD  08/31/2013  11:37 AM

## 2013-08-31 NOTE — Telephone Encounter (Signed)
, °

## 2013-09-11 ENCOUNTER — Ambulatory Visit: Payer: 59 | Attending: Oncology | Admitting: Physical Therapy

## 2013-09-11 ENCOUNTER — Encounter (HOSPITAL_COMMUNITY): Payer: Self-pay

## 2013-09-11 DIAGNOSIS — C20 Malignant neoplasm of rectum: Secondary | ICD-10-CM | POA: Insufficient documentation

## 2013-09-11 DIAGNOSIS — IMO0001 Reserved for inherently not codable concepts without codable children: Secondary | ICD-10-CM | POA: Insufficient documentation

## 2013-09-11 DIAGNOSIS — C7800 Secondary malignant neoplasm of unspecified lung: Secondary | ICD-10-CM | POA: Insufficient documentation

## 2013-09-11 DIAGNOSIS — M629 Disorder of muscle, unspecified: Secondary | ICD-10-CM | POA: Insufficient documentation

## 2013-09-11 DIAGNOSIS — M2420 Disorder of ligament, unspecified site: Secondary | ICD-10-CM | POA: Insufficient documentation

## 2013-09-14 ENCOUNTER — Other Ambulatory Visit: Payer: Self-pay | Admitting: Dermatology

## 2013-09-15 ENCOUNTER — Ambulatory Visit: Payer: 59 | Admitting: Physician Assistant

## 2013-09-15 ENCOUNTER — Telehealth: Payer: Self-pay | Admitting: *Deleted

## 2013-09-15 ENCOUNTER — Other Ambulatory Visit: Payer: Self-pay | Admitting: Nurse Practitioner

## 2013-09-15 ENCOUNTER — Telehealth: Payer: Self-pay | Admitting: Nurse Practitioner

## 2013-09-15 DIAGNOSIS — C20 Malignant neoplasm of rectum: Secondary | ICD-10-CM

## 2013-09-15 DIAGNOSIS — C7800 Secondary malignant neoplasm of unspecified lung: Principal | ICD-10-CM

## 2013-09-15 MED ORDER — HYDROCODONE-ACETAMINOPHEN 5-325 MG PO TABS: 1.0000 | ORAL_TABLET | Freq: Three times a day (TID) | ORAL | Status: DC | PRN

## 2013-09-15 NOTE — Telephone Encounter (Signed)
Patient calling in requesting refill on Oxycodone. States she is having severe lower back and leg pain. She has had multiple Rx for Oxycodone in the past. She attempted to call her primary and Dr Regis Bill is out of the office. Case reviewed with Ned Card, NP, who called Dr Benay Spice, whom was out of the office today.... 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 Patient states she is "not very happy". She said the problem is "nobody knows me".  This nurse attempted to help patient remember other physicians who have written for her in the past, and suggested she also call Dr Remo Lipps Hendricksons office, as he has prescribed for her pain meds in the past.

## 2013-09-15 NOTE — Telephone Encounter (Signed)
I contacted Dr. [name] for more information regarding the pain she is experiencing. She is not having back pain. No leg weakness or numbness. She reports having intermittent pain at the left posterior lower leg region since lung surgery in December 2014. The pain "flares up" periodically. She had increased leg pain following the rectal surgery in February 2015 and then again following the ileostomy reversal procedure in May of this year. She notes no swelling, discoloration. She reports the pulses are intact. The current pain is a very consistent with the previous pain she has experienced. She has tried Tramadol with no improvement. She has also tried Motrin and naproxen. She has taken hydrocodone in the past with good relief.  The pain seems to be more of a chronic problem. I have a low suspicion for a blood clot. I am comfortable giving her a prescription for Norco 5/325 one to 2 tablets every 8 hours as needed with 40 tablets to be dispensed. She understands to seek further evaluation with onset of leg swelling, redness, worsening pain or other new symptoms. I discussed the above with Dr. Juliann Mule with his agreement.

## 2013-09-18 ENCOUNTER — Ambulatory Visit: Payer: 59 | Admitting: Physical Therapy

## 2013-09-28 ENCOUNTER — Telehealth: Payer: Self-pay | Admitting: *Deleted

## 2013-09-28 NOTE — Telephone Encounter (Signed)
Patient is in [place] visiting and mentioned to her that her left leg is cramping on and off. Also reports some diarrhea and is concerned about getting it under control before starting chemo. Made her aware that she has mentioned the calf cramps in June. She should be seen at urgent care or hospital if the cramping is consistent or she develops any swelling in her leg, chest pain or dyspnea. She is at risk for blood clot. Suggested she try Imodium few times day if needed, however need to be careful not to get constipated due to the location of her cancer. She reports she actually took 1/2 Imodium today and it did help.

## 2013-09-30 ENCOUNTER — Other Ambulatory Visit: Payer: Self-pay | Admitting: Oncology

## 2013-10-02 ENCOUNTER — Ambulatory Visit (HOSPITAL_COMMUNITY)
Admission: RE | Admit: 2013-10-02 | Discharge: 2013-10-02 | Disposition: A | Discharge status: Home / Self Care | Payer: 59 | Source: Ambulatory Visit | Attending: Oncology | Admitting: Oncology

## 2013-10-02 ENCOUNTER — Encounter (HOSPITAL_COMMUNITY): Payer: Self-pay

## 2013-10-02 DIAGNOSIS — I700 Atherosclerosis of aorta: Secondary | ICD-10-CM | POA: Insufficient documentation

## 2013-10-02 DIAGNOSIS — R918 Other nonspecific abnormal finding of lung field: Secondary | ICD-10-CM | POA: Insufficient documentation

## 2013-10-02 DIAGNOSIS — C7800 Secondary malignant neoplasm of unspecified lung: Secondary | ICD-10-CM

## 2013-10-02 DIAGNOSIS — J90 Pleural effusion, not elsewhere classified: Secondary | ICD-10-CM | POA: Insufficient documentation

## 2013-10-02 DIAGNOSIS — M47814 Spondylosis without myelopathy or radiculopathy, thoracic region: Secondary | ICD-10-CM | POA: Insufficient documentation

## 2013-10-02 DIAGNOSIS — C20 Malignant neoplasm of rectum: Secondary | ICD-10-CM | POA: Insufficient documentation

## 2013-10-03 ENCOUNTER — Other Ambulatory Visit: Payer: Self-pay | Admitting: *Deleted

## 2013-10-03 ENCOUNTER — Ambulatory Visit (HOSPITAL_BASED_OUTPATIENT_CLINIC_OR_DEPARTMENT_OTHER): Payer: 59 | Admitting: Oncology

## 2013-10-03 ENCOUNTER — Telehealth: Payer: Self-pay | Admitting: *Deleted

## 2013-10-03 ENCOUNTER — Ambulatory Visit (HOSPITAL_BASED_OUTPATIENT_CLINIC_OR_DEPARTMENT_OTHER): Payer: 59

## 2013-10-03 ENCOUNTER — Telehealth: Payer: Self-pay | Admitting: Oncology

## 2013-10-03 ENCOUNTER — Other Ambulatory Visit (HOSPITAL_BASED_OUTPATIENT_CLINIC_OR_DEPARTMENT_OTHER): Payer: 59

## 2013-10-03 ENCOUNTER — Encounter: Payer: Self-pay | Admitting: *Deleted

## 2013-10-03 VITALS — BP 153/82 | HR 114 | Temp 97.0°F | Resp 18 | Ht 64.0 in | Wt 129.0 lb

## 2013-10-03 DIAGNOSIS — C20 Malignant neoplasm of rectum: Secondary | ICD-10-CM

## 2013-10-03 DIAGNOSIS — C7800 Secondary malignant neoplasm of unspecified lung: Principal | ICD-10-CM

## 2013-10-03 DIAGNOSIS — E119 Type 2 diabetes mellitus without complications: Secondary | ICD-10-CM

## 2013-10-03 DIAGNOSIS — C779 Secondary and unspecified malignant neoplasm of lymph node, unspecified: Secondary | ICD-10-CM

## 2013-10-03 DIAGNOSIS — Z5111 Encounter for antineoplastic chemotherapy: Secondary | ICD-10-CM

## 2013-10-03 DIAGNOSIS — Z5112 Encounter for antineoplastic immunotherapy: Secondary | ICD-10-CM

## 2013-10-03 DIAGNOSIS — J90 Pleural effusion, not elsewhere classified: Secondary | ICD-10-CM

## 2013-10-03 DIAGNOSIS — M79609 Pain in unspecified limb: Secondary | ICD-10-CM

## 2013-10-03 LAB — COMPREHENSIVE METABOLIC PANEL (CC13)
ALBUMIN: 4 g/dL (ref 3.5–5.0)
ALK PHOS: 53 U/L (ref 40–150)
ALT: 15 U/L (ref 0–55)
AST: 12 U/L (ref 5–34)
Anion Gap: 12 mEq/L — ABNORMAL HIGH (ref 3–11)
BUN: 16.4 mg/dL (ref 7.0–26.0)
CO2: 27 mEq/L (ref 22–29)
Calcium: 11.1 mg/dL — ABNORMAL HIGH (ref 8.4–10.4)
Chloride: 101 mEq/L (ref 98–109)
Creatinine: 0.7 mg/dL (ref 0.6–1.1)
Glucose: 185 mg/dl — ABNORMAL HIGH (ref 70–140)
POTASSIUM: 3.6 meq/L (ref 3.5–5.1)
Sodium: 141 mEq/L (ref 136–145)
Total Bilirubin: 0.38 mg/dL (ref 0.20–1.20)
Total Protein: 7 g/dL (ref 6.4–8.3)

## 2013-10-03 LAB — CBC WITH DIFFERENTIAL/PLATELET
BASO%: 2.2 % — ABNORMAL HIGH (ref 0.0–2.0)
Basophils Absolute: 0.1 10*3/uL (ref 0.0–0.1)
EOS%: 14 % — AB (ref 0.0–7.0)
Eosinophils Absolute: 0.8 10*3/uL — ABNORMAL HIGH (ref 0.0–0.5)
HCT: 36.4 % (ref 34.8–46.6)
HGB: 12 g/dL (ref 11.6–15.9)
LYMPH%: 13.2 % — ABNORMAL LOW (ref 14.0–49.7)
MCH: 26.5 pg (ref 25.1–34.0)
MCHC: 32.9 g/dL (ref 31.5–36.0)
MCV: 80.7 fL (ref 79.5–101.0)
MONO#: 0.4 10*3/uL (ref 0.1–0.9)
MONO%: 7 % (ref 0.0–14.0)
NEUT#: 3.8 10*3/uL (ref 1.5–6.5)
NEUT%: 63.6 % (ref 38.4–76.8)
Platelets: 361 10*3/uL (ref 145–400)
RBC: 4.51 10*6/uL (ref 3.70–5.45)
RDW: 15.8 % — AB (ref 11.2–14.5)
WBC: 5.9 10*3/uL (ref 3.9–10.3)
lymph#: 0.8 10*3/uL — ABNORMAL LOW (ref 0.9–3.3)

## 2013-10-03 LAB — UA PROTEIN, DIPSTICK - CHCC: Protein, ur: 30 mg/dL

## 2013-10-03 LAB — CEA: CEA: 3.1 ng/mL (ref 0.0–5.0)

## 2013-10-03 MED ORDER — DEXTROSE 5 % IV SOLN
400.0000 mg/m2 | Freq: Once | INTRAVENOUS | Status: AC
  Administered 2013-10-03: 660 mg via INTRAVENOUS
  Filled 2013-10-03: qty 33

## 2013-10-03 MED ORDER — FLUOROURACIL CHEMO INJECTION 2.5 GM/50ML
400.0000 mg/m2 | Freq: Once | INTRAVENOUS | Status: AC
  Administered 2013-10-03: 650 mg via INTRAVENOUS
  Filled 2013-10-03: qty 13

## 2013-10-03 MED ORDER — IRINOTECAN HCL CHEMO INJECTION 100 MG/5ML
182.0000 mg/m2 | Freq: Once | INTRAVENOUS | Status: AC
  Administered 2013-10-03: 300 mg via INTRAVENOUS
  Filled 2013-10-03: qty 15

## 2013-10-03 MED ORDER — ONDANSETRON 16 MG/50ML IVPB (CHCC)
16.0000 mg | Freq: Once | INTRAVENOUS | Status: AC
  Administered 2013-10-03: 16 mg via INTRAVENOUS

## 2013-10-03 MED ORDER — ATROPINE SULFATE 1 MG/ML IJ SOLN
0.5000 mg | Freq: Once | INTRAMUSCULAR | Status: AC | PRN
  Administered 2013-10-03: 0.5 mg via INTRAVENOUS

## 2013-10-03 MED ORDER — SODIUM CHLORIDE 0.9 % IV SOLN
2400.0000 mg/m2 | INTRAVENOUS | Status: DC
  Administered 2013-10-03: 3950 mg via INTRAVENOUS
  Filled 2013-10-03: qty 79

## 2013-10-03 MED ORDER — DEXAMETHASONE SODIUM PHOSPHATE 20 MG/5ML IJ SOLN
INTRAMUSCULAR | Status: AC
  Filled 2013-10-03: qty 5

## 2013-10-03 MED ORDER — OXYCODONE-ACETAMINOPHEN 5-325 MG PO TABS: 1.0000 | ORAL_TABLET | ORAL | Status: DC | PRN

## 2013-10-03 MED ORDER — SODIUM CHLORIDE 0.9 % IV SOLN
5.0000 mg/kg | Freq: Once | INTRAVENOUS | Status: AC
  Administered 2013-10-03: 300 mg via INTRAVENOUS
  Filled 2013-10-03: qty 12

## 2013-10-03 MED ORDER — DEXAMETHASONE SODIUM PHOSPHATE 20 MG/5ML IJ SOLN
20.0000 mg | Freq: Once | INTRAMUSCULAR | Status: AC
  Administered 2013-10-03: 20 mg via INTRAVENOUS

## 2013-10-03 MED ORDER — ATROPINE SULFATE 1 MG/ML IJ SOLN
INTRAMUSCULAR | Status: AC
  Filled 2013-10-03: qty 1

## 2013-10-03 MED ORDER — SODIUM CHLORIDE 0.9 % IV SOLN
Freq: Once | INTRAVENOUS | Status: AC
  Administered 2013-10-03: 10:00:00 via INTRAVENOUS

## 2013-10-03 MED ORDER — ONDANSETRON 16 MG/50ML IVPB (CHCC)
INTRAVENOUS | Status: AC
  Filled 2013-10-03: qty 16

## 2013-10-03 NOTE — Telephone Encounter (Signed)
Request made to J. Epps in pathology for MSI/IHC testing on Accession: [id number].

## 2013-10-03 NOTE — Telephone Encounter (Signed)
Pt came to nurse's desk asking to check to see if she has had BRIP 1 genetic testing. Per pt, sister with breast and ovarian CA was BRIP 1+. No genetics testing in pt's record. Per Dr. Benay Spice: Pt will be referred to genetics counseling. Pt made aware.

## 2013-10-03 NOTE — Telephone Encounter (Signed)
gv and printed appt scehd and avs for pt for July and Aug...sed added tx.

## 2013-10-03 NOTE — Progress Notes (Signed)
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 She took a trip to [place] to visit family. She reports developing pain in the left ischium and left leg prior to leaving for [place]. The pain has been present for several months, but has worsened. She feels the pain may be related to "sciatica ". The pain became more intense while she was in [place] and she was evaluated in the emergency room 09/29/2013. A CT revealed no evidence of pulmonary embolism, multiple pulmonary nodules, and a right pleural effusion with a small amount of loculated air. A Doppler of the left leg revealed no evidence of deep vein thrombosis.  She reports the pain makes it difficult to sleep at night. She is taking Percocet for relief of the pain. No leg weakness. She is having small frequent bowel movements.  Objective:  Vital signs in last 24 hours:  Blood pressure 153/82, pulse 114, temperature 97 F (36.1 C), temperature source Oral, resp. rate 18, height 5' 4" (1.626 m), weight 129 lb (58.514 kg).   Resp: Decreased breath sounds throughout the right lower chest, no respiratory distress Cardio: Regular rate and rhythm GI: No hepatomegaly, nontender, no mass Vascular: No leg edema Neuro: the motor exam appears intact in the lower extremities bilaterally Musculoskeletal: No spine tenderness. Mild tenderness at the left ischium, no mass. No pain with motion at the left hip. Tenderness at the left calf and left posterior tibia. No erythema or palpable cord.   Portacath/PICC-without erythema  Lab Results:  Lab Results  Component Value Date   WBC 5.9 10/03/2013   HGB 12.0 10/03/2013   HCT 36.4 10/03/2013   MCV 80.7 10/03/2013   PLT 361 10/03/2013   NEUTROABS 3.8 10/03/2013    Lab Results  Component Value Date   CEA 3.1 10/03/2013    Imaging:  Ct Chest Wo Contrast  10/02/2013   CLINICAL DATA:  Rectal cancer.  Followup pulmonary  nodules.  EXAM: CT CHEST WITHOUT CONTRAST  TECHNIQUE: Multidetector CT imaging of the chest was performed following the standard protocol without IV contrast.  COMPARISON:  07/25/2013  FINDINGS: Chronic right sided hydro pneumothorax is identified. There has been increase in pleural fluid from the previous exam. Multiple pulmonary nodules are again identified. The index nodule within the right upper lung measures 1.4 cm, image 16/series 5. Previously 1.2 cm the index nodule within the right lower lung measures 2.3 cm, image 26/series 5. Previously 1.2 cm. Index subpleural nodule in the left upper lobe measures 8 mm. Previously 6 mm. In the left lower lobe there is a 1.1 cm nodule, image 33/ series 5. Previously 0.7 cm.  Normal heart size. No pericardial effusion identified. Calcified atherosclerotic disease involves the thoracic aorta as well as the LAD and left circumflex coronary arteries. No enlarged mediastinal or hilar lymph nodes. There is no axillary or supraclavicular adenopathy.  Incidental imaging through the upper abdomen shows no acute findings.  Review of the visualized osseous structures is significant for multi level thoracic spondylosis.  IMPRESSION: 1. No acute findings within the chest. 2. Interval progression of multi focal pulmonary nodularity. 3. Atherosclerotic disease including multi vessel coronary artery calcifications 4. Chronic right hydro pneumothorax. There is been increase in volume of the right fluid.   Electronically Signed   By: Taylor  Stroud M.D.   On: 10/02/2013 11:25    Medications: I have reviewed the patient's current medications.  Assessment/Plan: 1. Metastatic   rectal cancer diagnosed July 2014.  Colonoscopy on 10/25/2012 showed a bulky circumferential friable mass lesion in the rectum at approximately 4 cm above the dentate line. The mass was subtotally obstructing and would not permit passage of the colonoscope proximal. Biopsy showed adenocarcinoma.  Staging CT  scans 10/25/2012 showed a right lower lobe lung mass measuring 5 x 4.6 cm; 0.8 cm nodule right upper lobe; masslike rectosigmoid wall thickening, surrounding stranding and adjacent lymphadenopathy.  PET scan 11/02/2012 showed hypermetabolism in the area of apparent circumferential rectal wall thickening; the 5 cm right lower lobe pulmonary mass was hypermetabolic with SUV max equal 8. No other hypermetabolic lung lesions were evident. Small bilateral pulmonary parenchymal nodules were noted.  She completed 5 cycles of FOLFOX chemotherapy 11/22/2012 through 02/13/2013.  Status post right thoracotomy, bilobectomy (right lower and middle lobectomy) with en bloc resection of the diaphragm 02/27/2013. Pathology showed adenocarcinoma consistent with metastatic colorectal adenocarcinoma, 4.3 cm, negative margins, no lymph node involvement. Negative soft tissue biopsy of the diaphragm x2. Positive for K-ras mutation-codon 13 She completed concurrent radiation and Xeloda for treatment of the rectal tumor 03/06/2013 through 04/11/2013.  Status post low anterior resection with a diverting loop ileostomy on 05/31/2013 at [facility] in [place]. Final pathology showed moderately differentiated adenocarcinoma located in the upper rectum; the tumor measured 2.5 x 2.2 cm; there was invasion through the muscularis propria into pericolic adipose tissue; tumor budding was present. Lymphatic status negative; vascular status negative; proximal and distal surgical margins negative; 2 of 17 lymph nodes showed metastatic adenocarcinoma. Mesenteric tumor deposits were present. Perineural invasion present. [facility] confirmed an additional positive lymph node and the tumor was staged as a T4a  CT abdomen/pelvis 05/12/2013. Metastatic nodules in the lung increased. Chronic loculated right base pneumothorax. Diffuse mucosal thickening of the sigmoid portion of the colon.  Restaging CTs 07/25/2013 with enlargement of  bilateral lung nodules, no other evidence of metastatic disease  Ileostomy takedown 08/14/2013 Chest CT 10/02/2013 revealed enlargement of bilateral lung nodules and increased right pleural fluid 2. Status post right VATS, drainage of pleural effusion, visceral and parietal pleural decortication 12/21/2012. Pleural fluid culture positive for viridans Streptococcus. Right pleura biopsies negative for malignancy. Findings favored empyema. 3. Diabetes. 4. Frequent bowel movements, diminished pelvic muscle tone-we made a referral to the pelvic physical therapy clinic 5. 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 She would like to begin of FOLFIRI/Avastin today. We again reviewed the potential toxicities associated with this regimen and she agrees to proceed.  I reviewed the 10/02/2013 CT images with her. Pulmonary nodules have enlarged and there is increased right pleural fluid.  The etiology of her pain is unclear. The pain may be related to a benign musculoskeletal condition. A Doppler of the left leg 09/29/2013 revealed no evidence of a deep vein thrombosis. I am concerned she could have a metastasis to the lumbosacral spine or pelvis. She will be referred for an MRI of the lumbar spine and pelvis.  Dr. Kertz will proceed with a first cycle of FOLFIRI/Avastin today. She will return for an office visit and cycle 2 in 2 weeks. We will followup on the MRI results in the interim.  We will submit her tumor for MSI/IHC testing.  Approximately 45 minutes were spent with patient today. The majority of time was used for counseling and coordination of care.   SHERRILL,   Dominica Severin, MD  10/03/2013  5:51 PM

## 2013-10-03 NOTE — Patient Instructions (Signed)
[facility] Discharge Instructions for Patients Receiving Chemotherapy  Today you received the following chemotherapy agents 5 FU/Irinotecan/Leucovorin/Avastin To help prevent nausea and vomiting after your treatment, we encourage you to take your nausea medication as prescribed.  If you develop nausea and vomiting that is not controlled by your nausea medication, call the clinic.   BELOW ARE SYMPTOMS THAT SHOULD BE REPORTED IMMEDIATELY:  *FEVER GREATER THAN 100.5 F  *CHILLS WITH OR WITHOUT FEVER  NAUSEA AND VOMITING THAT IS NOT CONTROLLED WITH YOUR NAUSEA MEDICATION  *UNUSUAL SHORTNESS OF BREATH  *UNUSUAL BRUISING OR BLEEDING  TENDERNESS IN MOUTH AND THROAT WITH OR WITHOUT PRESENCE OF ULCERS  *URINARY PROBLEMS  *BOWEL PROBLEMS  UNUSUAL RASH Items with * indicate a potential emergency and should be followed up as soon as possible.  Feel free to call the clinic you have any questions or concerns. The clinic phone number is [phone].    Fluorouracil, 5-FU injection What is this medicine? FLUOROURACIL, 5-FU (flure oh YOOR a sil) is a chemotherapy drug. It slows the growth of cancer cells. This medicine is used to treat many types of cancer like breast cancer, colon or rectal cancer, pancreatic cancer, and stomach cancer. This medicine may be used for other purposes; ask your health care provider or pharmacist if you have questions. COMMON BRAND NAME(S): Adrucil What should I tell my health care provider before I take this medicine? They need to know if you have any of these conditions: -blood disorders -dihydropyrimidine dehydrogenase (DPD) deficiency -infection (especially a virus infection such as chickenpox, cold sores, or herpes) -kidney disease -liver disease -malnourished, poor nutrition -recent or ongoing radiation therapy -an unusual or allergic reaction to fluorouracil, other chemotherapy, other medicines, foods, dyes, or preservatives -pregnant  or trying to get pregnant -breast-feeding How should I use this medicine? This drug is given as an infusion or injection into a vein. It is administered in a hospital or clinic by a specially trained health care professional. Talk to your pediatrician regarding the use of this medicine in children. Special care may be needed. Overdosage: If you think you have taken too much of this medicine contact a poison control center or emergency room at once. NOTE: This medicine is only for you. Do not share this medicine with others. What if I miss a dose? It is important not to miss your dose. Call your doctor or health care professional if you are unable to keep an appointment. What may interact with this medicine? -allopurinol -cimetidine -dapsone -digoxin -hydroxyurea -leucovorin -levamisole -medicines for seizures like ethotoin, fosphenytoin, phenytoin -medicines to increase blood counts like filgrastim, pegfilgrastim, sargramostim -medicines that treat or prevent blood clots like warfarin, enoxaparin, and dalteparin -methotrexate -metronidazole -pyrimethamine -some other chemotherapy drugs like busulfan, cisplatin, estramustine, vinblastine -trimethoprim -trimetrexate -vaccines Talk to your doctor or health care professional before taking any of these medicines: -acetaminophen -aspirin -ibuprofen -ketoprofen -naproxen This list may not describe all possible interactions. Give your health care provider a list of all the medicines, herbs, non-prescription drugs, or dietary supplements you use. Also tell them if you smoke, drink alcohol, or use illegal drugs. Some items may interact with your medicine. What should I watch for while using this medicine? Visit your doctor for checks on your progress. This drug may make you feel generally unwell. This is not uncommon, as chemotherapy can affect healthy cells as well as cancer cells. Report any side effects. Continue your course of treatment  even though you feel ill unless  your doctor tells you to stop. In some cases, you may be given additional medicines to help with side effects. Follow all directions for their use. Call your doctor or health care professional for advice if you get a fever, chills or sore throat, or other symptoms of a cold or flu. Do not treat yourself. This drug decreases your body's ability to fight infections. Try to avoid being around people who are sick. This medicine may increase your risk to bruise or bleed. Call your doctor or health care professional if you notice any unusual bleeding. Be careful brushing and flossing your teeth or using a toothpick because you may get an infection or bleed more easily. If you have any dental work done, tell your dentist you are receiving this medicine. Avoid taking products that contain aspirin, acetaminophen, ibuprofen, naproxen, or ketoprofen unless instructed by your doctor. These medicines may hide a fever. Do not become pregnant while taking this medicine. Women should inform their doctor if they wish to become pregnant or think they might be pregnant. There is a potential for serious side effects to an unborn child. Talk to your health care professional or pharmacist for more information. Do not breast-feed an infant while taking this medicine. Men should inform their doctor if they wish to father a child. This medicine may lower sperm counts. Do not treat diarrhea with over the counter products. Contact your doctor if you have diarrhea that lasts more than 2 days or if it is severe and watery. This medicine can make you more sensitive to the sun. Keep out of the sun. If you cannot avoid being in the sun, wear protective clothing and use sunscreen. Do not use sun lamps or tanning beds/booths. What side effects may I notice from receiving this medicine? Side effects that you should report to your doctor or health care professional as soon as possible: -allergic reactions  like skin rash, itching or hives, swelling of the face, lips, or tongue -low blood counts - this medicine may decrease the number of white blood cells, red blood cells and platelets. You may be at increased risk for infections and bleeding. -signs of infection - fever or chills, cough, sore throat, pain or difficulty passing urine -signs of decreased platelets or bleeding - bruising, pinpoint red spots on the skin, black, tarry stools, blood in the urine -signs of decreased red blood cells - unusually weak or tired, fainting spells, lightheadedness -breathing problems -changes in vision -chest pain -mouth sores -nausea and vomiting -pain, swelling, redness at site where injected -pain, tingling, numbness in the hands or feet -redness, swelling, or sores on hands or feet -stomach pain -unusual bleeding Side effects that usually do not require medical attention (report to your doctor or health care professional if they continue or are bothersome): -changes in finger or toe nails -diarrhea -dry or itchy skin -hair loss -headache -loss of appetite -sensitivity of eyes to the light -stomach upset -unusually teary eyes This list may not describe all possible side effects. Call your doctor for medical advice about side effects. You may report side effects to FDA at 1-800-FDA-1088. Where should I keep my medicine? This drug is given in a hospital or clinic and will not be stored at home. NOTE: This sheet is a summary. It may not cover all possible information. If you have questions about this medicine, talk to your doctor, pharmacist, or health care provider.  2015, Elsevier/Gold Standard. (2007-07-20 13:53:16)   Leucovorin injection What is this medicine?  LEUCOVORIN (loo koe VOR in) is used to prevent or treat the harmful effects of some medicines. This medicine is used to treat anemia caused by a low amount of folic acid in the body. It is also used with 5-fluorouracil (5-FU) to treat  colon cancer. This medicine may be used for other purposes; ask your health care provider or pharmacist if you have questions. What should I tell my health care provider before I take this medicine? They need to know if you have any of these conditions: -anemia from low levels of vitamin B-12 in the blood -an unusual or allergic reaction to leucovorin, folic acid, other medicines, foods, dyes, or preservatives -pregnant or trying to get pregnant -breast-feeding How should I use this medicine? This medicine is for injection into a muscle or into a vein. It is given by a health care professional in a hospital or clinic setting. Talk to your pediatrician regarding the use of this medicine in children. Special care may be needed. Overdosage: If you think you have taken too much of this medicine contact a poison control center or emergency room at once. NOTE: This medicine is only for you. Do not share this medicine with others. What if I miss a dose? This does not apply. What may interact with this medicine? -capecitabine -fluorouracil -phenobarbital -phenytoin -primidone -trimethoprim-sulfamethoxazole This list may not describe all possible interactions. Give your health care provider a list of all the medicines, herbs, non-prescription drugs, or dietary supplements you use. Also tell them if you smoke, drink alcohol, or use illegal drugs. Some items may interact with your medicine. What should I watch for while using this medicine? Your condition will be monitored carefully while you are receiving this medicine. This medicine may increase the side effects of 5-fluorouracil, 5-FU. Tell your doctor or health care professional if you have diarrhea or mouth sores that do not get better or that get worse. What side effects may I notice from receiving this medicine? Side effects that you should report to your doctor or health care professional as soon as possible: -allergic reactions like skin  rash, itching or hives, swelling of the face, lips, or tongue -breathing problems -fever, infection -mouth sores -unusual bleeding or bruising -unusually weak or tired Side effects that usually do not require medical attention (report to your doctor or health care professional if they continue or are bothersome): -constipation or diarrhea -loss of appetite -nausea, vomiting This list may not describe all possible side effects. Call your doctor for medical advice about side effects. You may report side effects to FDA at 1-800-FDA-1088. Where should I keep my medicine? This drug is given in a hospital or clinic and will not be stored at home. NOTE: This sheet is a summary. It may not cover all possible information. If you have questions about this medicine, talk to your doctor, pharmacist, or health care provider.  2015, Elsevier/Gold Standard. (2007-09-20 16:50:29)  Irinotecan injection (Camptosar) What is this medicine? IRINOTECAN (ir in oh TEE kan ) is a chemotherapy drug. It is used to treat colon and rectal cancer. This medicine may be used for other purposes; ask your health care provider or pharmacist if you have questions. COMMON BRAND NAME(S): Camptosar What should I tell my health care provider before I take this medicine? They need to know if you have any of these conditions: -blood disorders -dehydration -diarrhea -infection (especially a virus infection such as chickenpox, cold sores, or herpes) -liver disease -low blood counts, like low white  cell, platelet, or red cell counts -recent or ongoing radiation therapy -an unusual or allergic reaction to irinotecan, sorbitol, other chemotherapy, other medicines, foods, dyes, or preservatives -pregnant or trying to get pregnant -breast-feeding How should I use this medicine? This drug is given as an infusion into a vein. It is administered in a hospital or clinic by a specially trained health care professional. Talk to your  pediatrician regarding the use of this medicine in children. Special care may be needed. Overdosage: If you think you have taken too much of this medicine contact a poison control center or emergency room at once. NOTE: This medicine is only for you. Do not share this medicine with others. What if I miss a dose? It is important not to miss your dose. Call your doctor or health care professional if you are unable to keep an appointment. What may interact with this medicine? Do not take this medicine with any of the following medications: -atazanavir -certain medicines for fungal infections like itraconazole and ketoconazole -St. John's Wort This medicine may also interact with the following medications: -dexamethasone -diuretics -laxatives -medicines for seizures like carbamazepine, mephobarbital, phenobarbital, phenytoin, primidone -medicines to increase blood counts like filgrastim, pegfilgrastim, sargramostim -prochlorperazine -vaccines This list may not describe all possible interactions. Give your health care provider a list of all the medicines, herbs, non-prescription drugs, or dietary supplements you use. Also tell them if you smoke, drink alcohol, or use illegal drugs. Some items may interact with your medicine. What should I watch for while using this medicine? Your condition will be monitored carefully while you are receiving this medicine. You will need important blood work done while you are taking this medicine. This drug may make you feel generally unwell. This is not uncommon, as chemotherapy can affect healthy cells as well as cancer cells. Report any side effects. Continue your course of treatment even though you feel ill unless your doctor tells you to stop. In some cases, you may be given additional medicines to help with side effects. Follow all directions for their use. You may get drowsy or dizzy. Do not drive, use machinery, or do anything that needs mental alertness  until you know how this medicine affects you. Do not stand or sit up quickly, especially if you are an older patient. This reduces the risk of dizzy or fainting spells. Call your doctor or health care professional for advice if you get a fever, chills or sore throat, or other symptoms of a cold or flu. Do not treat yourself. This drug decreases your body's ability to fight infections. Try to avoid being around people who are sick. This medicine may increase your risk to bruise or bleed. Call your doctor or health care professional if you notice any unusual bleeding. Be careful brushing and flossing your teeth or using a toothpick because you may get an infection or bleed more easily. If you have any dental work done, tell your dentist you are receiving this medicine. Avoid taking products that contain aspirin, acetaminophen, ibuprofen, naproxen, or ketoprofen unless instructed by your doctor. These medicines may hide a fever. Do not become pregnant while taking this medicine. Women should inform their doctor if they wish to become pregnant or think they might be pregnant. There is a potential for serious side effects to an unborn child. Talk to your health care professional or pharmacist for more information. Do not breast-feed an infant while taking this medicine. What side effects may I notice from receiving this medicine?  Side effects that you should report to your doctor or health care professional as soon as possible: -allergic reactions like skin rash, itching or hives, swelling of the face, lips, or tongue -low blood counts - this medicine may decrease the number of white blood cells, red blood cells and platelets. You may be at increased risk for infections and bleeding. -signs of infection - fever or chills, cough, sore throat, pain or difficulty passing urine -signs of decreased platelets or bleeding - bruising, pinpoint red spots on the skin, black, tarry stools, blood in the urine -signs of  decreased red blood cells - unusually weak or tired, fainting spells, lightheadedness -breathing problems -chest pain -diarrhea -feeling faint or lightheaded, falls -flushing, runny nose, sweating during infusion -mouth sores or pain -pain, swelling, redness or irritation where injected -pain, swelling, warmth in the leg -pain, tingling, numbness in the hands or feet -problems with balance, talking, walking -stomach cramps, pain -trouble passing urine or change in the amount of urine -vomiting as to be unable to hold down drinks or food -yellowing of the eyes or skin Side effects that usually do not require medical attention (report to your doctor or health care professional if they continue or are bothersome): -constipation -hair loss -headache -loss of appetite -nausea, vomiting -stomach upset This list may not describe all possible side effects. Call your doctor for medical advice about side effects. You may report side effects to FDA at 1-800-FDA-1088. Where should I keep my medicine? This drug is given in a hospital or clinic and will not be stored at home. NOTE: This sheet is a summary. It may not cover all possible information. If you have questions about this medicine, talk to your doctor, pharmacist, or health care provider.  2015, Elsevier/Gold Standard. (2012-09-12 16:29:32)  Bevacizumab injection (Avastin) What is this medicine? BEVACIZUMAB (be va SIZ yoo mab) is a chemotherapy drug. It targets a protein found in many cancer cell types, and halts cancer growth. This drug treats many cancers including non-small cell lung cancer, ovarian cancer, cervical cancer, and colon or rectal cancer. It is usually given with other chemotherapy drugs. This medicine may be used for other purposes; ask your health care provider or pharmacist if you have questions. COMMON BRAND NAME(S): Avastin What should I tell my health care provider before I take this medicine? They need to know if  you have any of these conditions: -blood clots -heart disease, including heart failure, heart attack, or chest pain (angina) -high blood pressure -infection (especially a virus infection such as chickenpox, cold sores, or herpes) -kidney disease -lung disease -prior chemotherapy with doxorubicin, daunorubicin, epirubicin, or other anthracycline type chemotherapy agents -recent or ongoing radiation therapy -recent surgery -stroke -an unusual or allergic reaction to bevacizumab, hamster proteins, mouse proteins, other medicines, foods, dyes, or preservatives -pregnant or trying to get pregnant -breast-feeding How should I use this medicine? This medicine is for infusion into a vein. It is given by a health care professional in a hospital or clinic setting. Talk to your pediatrician regarding the use of this medicine in children. Special care may be needed. Overdosage: If you think you have taken too much of this medicine contact a poison control center or emergency room at once. NOTE: This medicine is only for you. Do not share this medicine with others. What if I miss a dose? It is important not to miss your dose. Call your doctor or health care professional if you are unable to keep an appointment. What  may interact with this medicine? Interactions are not expected. This list may not describe all possible interactions. Give your health care provider a list of all the medicines, herbs, non-prescription drugs, or dietary supplements you use. Also tell them if you smoke, drink alcohol, or use illegal drugs. Some items may interact with your medicine. What should I watch for while using this medicine? Your condition will be monitored carefully while you are receiving this medicine. You will need important blood work and urine testing done while you are taking this medicine. During your treatment, let your health care professional know if you have any unusual symptoms, such as difficulty  breathing. This medicine may rarely cause 'gastrointestinal perforation' (holes in the stomach, intestines or colon), a serious side effect requiring surgery to repair. This medicine should be started at least 28 days following major surgery and the site of the surgery should be totally healed. Check with your doctor before scheduling dental work or surgery while you are receiving this treatment. Talk to your doctor if you have recently had surgery or if you have a wound that has not healed. Do not become pregnant while taking this medicine. Women should inform their doctor if they wish to become pregnant or think they might be pregnant. There is a potential for serious side effects to an unborn child. Talk to your health care professional or pharmacist for more information. Do not breast-feed an infant while taking this medicine. This medicine has caused ovarian failure in some women. This medicine may interfere with the ability to have a child. You should talk to your doctor or health care professional if you are concerned about your fertility. What side effects may I notice from receiving this medicine? Side effects that you should report to your doctor or health care professional as soon as possible: -allergic reactions like skin rash, itching or hives, swelling of the face, lips, or tongue -signs of infection - fever or chills, cough, sore throat, pain or trouble passing urine -signs of decreased platelets or bleeding - bruising, pinpoint red spots on the skin, black, tarry stools, nosebleeds, blood in the urine -breathing problems -changes in vision -chest pain -confusion -jaw pain, especially after dental work -mouth sores -seizures -severe abdominal pain -severe headache -sudden numbness or weakness of the face, arm or leg -swelling of legs or ankles -symptoms of a stroke: change in mental awareness, inability to talk or move one side of the body (especially in patients with lung  cancer) -trouble passing urine or change in the amount of urine -trouble speaking or understanding -trouble walking, dizziness, loss of balance or coordination Side effects that usually do not require medical attention (report to your doctor or health care professional if they continue or are bothersome): -constipation -diarrhea -dry skin -headache -loss of appetite -nausea, vomiting This list may not describe all possible side effects. Call your doctor for medical advice about side effects. You may report side effects to FDA at 1-800-FDA-1088. Where should I keep my medicine? This drug is given in a hospital or clinic and will not be stored at home. NOTE: This sheet is a summary. It may not cover all possible information. If you have questions about this medicine, talk to your doctor, pharmacist, or health care provider.  2015, [employer] Standard. (2013-02-14 11:38:34)

## 2013-10-04 ENCOUNTER — Ambulatory Visit: Payer: 59 | Admitting: Physical Therapy

## 2013-10-04 ENCOUNTER — Telehealth: Payer: Self-pay | Admitting: Oncology

## 2013-10-04 ENCOUNTER — Telehealth: Payer: Self-pay | Admitting: *Deleted

## 2013-10-04 NOTE — Telephone Encounter (Signed)
, °

## 2013-10-04 NOTE — Telephone Encounter (Signed)
Message copied by Jesse Fall on Wed Oct 04, 2013  5:27 PM ------      Message from: Jesse Fall      Created: Wed Oct 04, 2013  5:26 PM      Regarding: chemo follow up       [facility] ------

## 2013-10-04 NOTE — Telephone Encounter (Signed)
Called pt to check on her from [facility] chemo yest.  She had cramping while here getting treatment yest & atropine was given which relieved her pain after a short time.  She reports doing OK but had some nausea that didn't last long.  She has nausea med but didn't need to take it.  She states that she has some trouble with her bowels normally & usually only has BM every other or every 2 days & had a BM today. She will come tomorrow for pump D/C & knows to call with any questions or concerns.  She reports no other symptoms.

## 2013-10-05 ENCOUNTER — Ambulatory Visit (HOSPITAL_BASED_OUTPATIENT_CLINIC_OR_DEPARTMENT_OTHER): Payer: 59

## 2013-10-05 ENCOUNTER — Encounter: Payer: 59 | Admitting: Physical Therapy

## 2013-10-05 VITALS — BP 161/94 | HR 100 | Temp 98.2°F

## 2013-10-05 DIAGNOSIS — C7800 Secondary malignant neoplasm of unspecified lung: Secondary | ICD-10-CM

## 2013-10-05 DIAGNOSIS — C20 Malignant neoplasm of rectum: Secondary | ICD-10-CM

## 2013-10-05 MED ORDER — HEPARIN SOD (PORK) LOCK FLUSH 100 UNIT/ML IV SOLN
500.0000 [IU] | Freq: Once | INTRAVENOUS | Status: AC | PRN
  Administered 2013-10-05: 500 [IU]
  Filled 2013-10-05: qty 5

## 2013-10-05 MED ORDER — SODIUM CHLORIDE 0.9 % IJ SOLN
10.0000 mL | INTRAMUSCULAR | Status: DC | PRN
  Administered 2013-10-05: 10 mL
  Filled 2013-10-05: qty 10

## 2013-10-09 ENCOUNTER — Ambulatory Visit: Payer: 59 | Admitting: Physical Therapy

## 2013-10-11 ENCOUNTER — Ambulatory Visit
Admission: RE | Admit: 2013-10-11 | Discharge: 2013-10-11 | Disposition: A | Discharge status: Home / Self Care | Payer: 59 | Source: Ambulatory Visit | Attending: Oncology | Admitting: Oncology

## 2013-10-11 ENCOUNTER — Ambulatory Visit (HOSPITAL_BASED_OUTPATIENT_CLINIC_OR_DEPARTMENT_OTHER): Payer: 59 | Admitting: Genetic Counselor

## 2013-10-11 ENCOUNTER — Encounter: Payer: Self-pay | Admitting: Genetic Counselor

## 2013-10-11 ENCOUNTER — Other Ambulatory Visit: Payer: 59

## 2013-10-11 DIAGNOSIS — Z8589 Personal history of malignant neoplasm of other organs and systems: Secondary | ICD-10-CM

## 2013-10-11 DIAGNOSIS — C20 Malignant neoplasm of rectum: Secondary | ICD-10-CM

## 2013-10-11 DIAGNOSIS — Z803 Family history of malignant neoplasm of breast: Secondary | ICD-10-CM

## 2013-10-11 DIAGNOSIS — Z808 Family history of malignant neoplasm of other organs or systems: Secondary | ICD-10-CM

## 2013-10-11 DIAGNOSIS — C7800 Secondary malignant neoplasm of unspecified lung: Principal | ICD-10-CM

## 2013-10-11 DIAGNOSIS — IMO0002 Reserved for concepts with insufficient information to code with codable children: Secondary | ICD-10-CM

## 2013-10-11 DIAGNOSIS — Z8041 Family history of malignant neoplasm of ovary: Secondary | ICD-10-CM

## 2013-10-11 MED ORDER — GADOBENATE DIMEGLUMINE 529 MG/ML IV SOLN
11.0000 mL | Freq: Once | INTRAVENOUS | Status: AC | PRN
  Administered 2013-10-11: 11 mL via INTRAVENOUS

## 2013-10-11 NOTE — Progress Notes (Signed)
Patient Name: Anne [name] Patient Age: 68 y.o. Encounter Date: 10/11/2013  Referring Physician: Betsy Coder, MD  Primary Care Provider: [name], MD   Anne [name], a 68 y.o. female, is being seen at the [facility] due to a personal and family history of cancer as well as to discuss genetic testing results of her sister.  She presents to clinic today to discuss the possibility of a hereditary predisposition to cancer and discuss whether genetic testing for the familial BRIP1 mutation is warranted.  HISTORY OF PRESENT ILLNESS: Anne [name] was diagnosed with metastatic rectal cancer in 09/2012 at the age of 27. She is s/p surgery, chemotherapy and radiation. 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 She had a prophylactic TAH/BSO in her mid-50s after her sister's ovarian cancer diagnosis.   Past Medical History  Diagnosis Date  . Postmenopausal HRT (hormone replacement therapy) 07/01/2011    surgical menopause  . Hx of hyperglycemia 07/01/2011  . Hx of compression fracture of spine 1985    t12 after a  20 foot fall  . Hx of fracture of wrist     after fall   . 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 Hx of colonic polyps   . Early cataract     stoneburner   . High frequency hearing loss     kraus followed   . Diabetes mellitus without complication   . Complication of anesthesia     pt hadpanic attacks with versed, no versed  . Status post chemotherapy     completed a total of three cycles of Folfox   . Colon cancer     rectal cancer with lung metastasis  . Skin cancer     multiple  . Heart murmur, systolic 06/05/1015    innocent per Dr. Gershon Mussel   . Asthma     as a child none since 33 years old  . Headache(784.0)   . Family history of malignant neoplasm of breast   . Family history of malignant neoplasm of ovary     Past Surgical History  Procedure Laterality Date  . Tonsillectomy   1952  . Adenoidectomy  1952  . Wrist surgery  1957    left reduction  . Compression fraction  1985    T12  . Anterior cruciate ligament repair  1993    right Murphy  . Cosmetic surgery  1999-2007    holderness blepharoplasty facial rhytidectomy mastopexy abdominoplasty   . Dental implant    . Dilation and curettage of uterus  1977  . Total abdominal hysterectomy  2005    with cystocele repair   . Colonoscopy N/A 10/24/2012    Procedure: COLONOSCOPY;  Surgeon: Irene Shipper, MD;  Location: [facility];  Service: Endoscopy;  Laterality: N/A;  . Colonoscopy N/A 10/25/2012    Procedure: COLONOSCOPY;  Surgeon: Irene Shipper, MD;  Location: [facility];  Service: Endoscopy;  Laterality: N/A;  . Video bronchoscopy Bilateral 10/27/2012    Procedure: VIDEO BRONCHOSCOPY WITH FLUORO;  Surgeon: Chesley Mires, MD;  Location: [facility] ENDOSCOPY;  Service: Endoscopy;  Laterality: Bilateral;  . Portacath placement Left 11/21/2012    Procedure: INSERTION PORT-A-CATH;  Surgeon: Melrose Nakayama, MD;  Location: [place];  Service: Thoracic;  Laterality: Left;  . Video assisted thoracoscopy Right 12/21/2012    Procedure: VIDEO ASSISTED THORACOSCOPY;  Surgeon: Melrose Nakayama, MD;  Location: [facility]  OR;  Service: Thoracic;  Laterality: Right;  . Pleural effusion drainage Right 12/21/2012    Procedure: DRAINAGE OF PLEURAL EFFUSION;  Surgeon: Melrose Nakayama, MD;  Location: [place];  Service: Thoracic;  Laterality: Right;  . Decortication Right 12/21/2012    Procedure: DECORTICATION;  Surgeon: Melrose Nakayama, MD;  Location: [place];  Service: Thoracic;  Laterality: Right;  . Thorocotomy with lobectomy Right 02/27/2013    Procedure: THOROCOTOMY WITH BI- LOBECTOMY;  Surgeon: Melrose Nakayama, MD;  Location: [place];  Service: Thoracic;  Laterality: Right;    History   Social History  . Marital Status: Widowed    Spouse Name: N/A    Number of Children: 2  . Years of Education: N/A   Occupational History  .  PHYSICIAN    Social History Main Topics  . Smoking status: Never Smoker   . Smokeless tobacco: Never Used  . Alcohol Use: 1.0 oz/week    2 drink(s) per week     Comment: occ  . Drug Use: No  . Sexual Activity: No   Other Topics Concern  . Not on file   Social History Narrative   Divorced  2006 after 64 years marriage    MD developmental Academic librarian    Social etoh   NON smoker   exercise 4 x per week  Has personal trainer   G4P2     FAMILY HISTORY:   During the visit, a 4-generation pedigree was obtained. Significant diagnoses include the following:  Family History  Problem Relation Age of Onset  . Diabetes Father   . Thyroid disease Father   . Heart attack Father     died age 98  . Skin cancer Father   . Dementia Mother   . Hypertension Mother   . Hyperlipidemia Mother   . Obesity Mother   . Cancer Brother     retro lymphosarcoma agent orange  . Breast cancer Sister 50    BRIP1 pathogenic mutation  . Ovarian cancer Sister 65    currently 53  . Diabetes Brother   . Obesity Brother   . Hypertension Sister   . Asthma Sister   . Cancer Paternal Uncle     unk. primary; deceased 45    Additionally, Dr. [name] has a son (age 38) and a daughter (age 66). Her mother is 58 and cancer-free. Her father had only 3 brothers. Her sister who had breast and ovarian cancers was found to have a BRIP1 mutation (c.2392C>T ; p.Arg798Stop) as well as a BRCA2 variant of unknown significance (VUS).  Ms. Stephen ancestry is Caucasian - NOS. There is no known Jewish ancestry and no consanguinity.  ASSESSMENT AND PLAN: Ms. Golda is a 68 y.o. female with a personal history of rectal cancer diagnosed at age 80 and family history of breast and ovarian cancers in one of her sisters. This sister was recently found to have a pathogenic BRIP1 mutation (c.2392C>T ; p.Arg798Stop).  We reviewed the characteristics, features and inheritance patterns of hereditary cancer syndromes and  the limited information that is known about BRIP1. Pathogenic mutations in BRIP1 are associated with an increased risk of breast and ovarian cancer, but specific lifetime risks are not known. Given this limited information, there are no specific recommendations from the NCCN or any other group regarding management of those with a BRIP1 mutation. BRIP1 is not thought to be associated with rectal cancer. We also discussed genetic testing, including the other appropriate family members to test, the  process of testing, and insurance coverage. We emphasized that there is no clinical utility of testing her for the BRCA2 VUS.  Dr. [name] wished to pursue genetic testing and a blood sample will be sent to [facility] for site-specific analysis of the BRIP1 familial mutation. We discussed the implications of a positive and negative result and that it will have no impact on her own medical management, but may be informative for her children. Results should be available in approximately 2-3 weeks, at which point we will contact her and address implications for her as well as address genetic testing for at-risk family members, if needed.    We encouraged Dr. [name] to remain in contact with Cancer Genetics annually so that we can update the family history and inform her of any changes in cancer genetics and testing that may be of benefit for this family. Dr.  Laroy Apple questions were answered to her satisfaction today.   Thank you for the referral and allowing [place] to share in the care of your patient.   The patient was seen for a total of 30 minutes, greater than 50% of which was spent face-to-face counseling. This patient was discussed with the overseeing provider who agrees with the above.   Steele Berg, MS, [place] Certified Genetic Counseor phone: [phone] [email or web address]

## 2013-10-13 ENCOUNTER — Telehealth: Payer: Self-pay | Admitting: *Deleted

## 2013-10-13 NOTE — Telephone Encounter (Signed)
Message copied by Brien Few on Fri Oct 13, 2013  2:20 PM ------      Message from: Betsy Coder B      Created: Thu Oct 12, 2013  9:17 PM       Please call patient, no metastatic disease identified, radiation changes seen in pelvis, ?inflammation at left sciatic nerve-we can discuss at next visit, no significant nerve compression to explain her pain ------

## 2013-10-13 NOTE — Telephone Encounter (Signed)
Message copied by Tania Ade on Fri Oct 13, 2013  2:44 PM ------      Message from: Betsy Coder B      Created: Thu Oct 12, 2013  9:17 PM       Please call patient, no metastatic disease identified, radiation changes seen in pelvis, ?inflammation at left sciatic nerve-we can discuss at next visit, no significant nerve compression to explain her pain ------

## 2013-10-13 NOTE — Telephone Encounter (Signed)
Called pt with MRI results, per Dr. Benay Spice. (see note below.) She voiced understanding. Reports pain continues intermittently, without pattern. Next appt confirmed.

## 2013-10-13 NOTE — Telephone Encounter (Signed)
Notified patient of Dr. Gearldine Shown review of her scan showed after sister had called office to request return call. Earlier information was given to their elderly mother and she does not always get messages correct. Patient still feels the inflammation at left sciatic nerve could be causing her pain, but she will discuss with MD at next visit.

## 2013-10-15 ENCOUNTER — Other Ambulatory Visit: Payer: Self-pay | Admitting: Oncology

## 2013-10-17 ENCOUNTER — Other Ambulatory Visit (HOSPITAL_BASED_OUTPATIENT_CLINIC_OR_DEPARTMENT_OTHER): Payer: 59

## 2013-10-17 ENCOUNTER — Ambulatory Visit (HOSPITAL_BASED_OUTPATIENT_CLINIC_OR_DEPARTMENT_OTHER): Payer: 59 | Admitting: Oncology

## 2013-10-17 ENCOUNTER — Ambulatory Visit (HOSPITAL_BASED_OUTPATIENT_CLINIC_OR_DEPARTMENT_OTHER): Payer: 59

## 2013-10-17 VITALS — BP 154/60 | HR 89 | Temp 99.2°F | Resp 18 | Ht 64.0 in | Wt 127.0 lb

## 2013-10-17 VITALS — BP 143/72

## 2013-10-17 DIAGNOSIS — Z5112 Encounter for antineoplastic immunotherapy: Secondary | ICD-10-CM

## 2013-10-17 DIAGNOSIS — C7800 Secondary malignant neoplasm of unspecified lung: Secondary | ICD-10-CM

## 2013-10-17 DIAGNOSIS — C20 Malignant neoplasm of rectum: Secondary | ICD-10-CM

## 2013-10-17 DIAGNOSIS — Z5111 Encounter for antineoplastic chemotherapy: Secondary | ICD-10-CM

## 2013-10-17 LAB — CBC WITH DIFFERENTIAL/PLATELET
BASO%: 0.4 % (ref 0.0–2.0)
BASOS ABS: 0 10*3/uL (ref 0.0–0.1)
EOS ABS: 0.2 10*3/uL (ref 0.0–0.5)
EOS%: 4.5 % (ref 0.0–7.0)
HEMATOCRIT: 35 % (ref 34.8–46.6)
HGB: 11.6 g/dL (ref 11.6–15.9)
LYMPH%: 14.6 % (ref 14.0–49.7)
MCH: 26.5 pg (ref 25.1–34.0)
MCHC: 33.1 g/dL (ref 31.5–36.0)
MCV: 79.9 fL (ref 79.5–101.0)
MONO#: 0.3 10*3/uL (ref 0.1–0.9)
MONO%: 6.9 % (ref 0.0–14.0)
NEUT#: 3.6 10*3/uL (ref 1.5–6.5)
NEUT%: 73.6 % (ref 38.4–76.8)
PLATELETS: 221 10*3/uL (ref 145–400)
RBC: 4.38 10*6/uL (ref 3.70–5.45)
RDW: 15.7 % — ABNORMAL HIGH (ref 11.2–14.5)
WBC: 4.9 10*3/uL (ref 3.9–10.3)
lymph#: 0.7 10*3/uL — ABNORMAL LOW (ref 0.9–3.3)

## 2013-10-17 LAB — COMPREHENSIVE METABOLIC PANEL (CC13)
ALT: 23 U/L (ref 0–55)
AST: 11 U/L (ref 5–34)
Albumin: 4 g/dL (ref 3.5–5.0)
Alkaline Phosphatase: 51 U/L (ref 40–150)
Anion Gap: 9 mEq/L (ref 3–11)
BILIRUBIN TOTAL: 0.39 mg/dL (ref 0.20–1.20)
BUN: 14.5 mg/dL (ref 7.0–26.0)
CO2: 25 meq/L (ref 22–29)
CREATININE: 0.7 mg/dL (ref 0.6–1.1)
Calcium: 10.1 mg/dL (ref 8.4–10.4)
Chloride: 102 mEq/L (ref 98–109)
GLUCOSE: 147 mg/dL — AB (ref 70–140)
Potassium: 3.9 mEq/L (ref 3.5–5.1)
Sodium: 136 mEq/L (ref 136–145)
Total Protein: 6.9 g/dL (ref 6.4–8.3)

## 2013-10-17 LAB — UA PROTEIN, DIPSTICK - CHCC: PROTEIN: NEGATIVE mg/dL

## 2013-10-17 MED ORDER — IRINOTECAN HCL CHEMO INJECTION 100 MG/5ML
182.0000 mg/m2 | Freq: Once | INTRAVENOUS | Status: AC
  Administered 2013-10-17: 300 mg via INTRAVENOUS
  Filled 2013-10-17: qty 15

## 2013-10-17 MED ORDER — SODIUM CHLORIDE 0.9 % IJ SOLN
10.0000 mL | INTRAMUSCULAR | Status: DC | PRN
  Filled 2013-10-17: qty 10

## 2013-10-17 MED ORDER — DEXAMETHASONE SODIUM PHOSPHATE 20 MG/5ML IJ SOLN
INTRAMUSCULAR | Status: AC
  Filled 2013-10-17: qty 5

## 2013-10-17 MED ORDER — SODIUM CHLORIDE 0.9 % IV SOLN
2400.0000 mg/m2 | INTRAVENOUS | Status: DC
  Administered 2013-10-17: 3950 mg via INTRAVENOUS
  Filled 2013-10-17: qty 79

## 2013-10-17 MED ORDER — FLUOROURACIL CHEMO INJECTION 2.5 GM/50ML
400.0000 mg/m2 | Freq: Once | INTRAVENOUS | Status: AC
  Administered 2013-10-17: 650 mg via INTRAVENOUS
  Filled 2013-10-17: qty 13

## 2013-10-17 MED ORDER — SODIUM CHLORIDE 0.9 % IV SOLN
Freq: Once | INTRAVENOUS | Status: AC
  Administered 2013-10-17: 10:00:00 via INTRAVENOUS

## 2013-10-17 MED ORDER — DEXAMETHASONE SODIUM PHOSPHATE 20 MG/5ML IJ SOLN
20.0000 mg | Freq: Once | INTRAMUSCULAR | Status: AC
  Administered 2013-10-17: 20 mg via INTRAVENOUS

## 2013-10-17 MED ORDER — BEVACIZUMAB CHEMO INJECTION 400 MG/16ML
5.0000 mg/kg | Freq: Once | INTRAVENOUS | Status: AC
  Administered 2013-10-17: 300 mg via INTRAVENOUS
  Filled 2013-10-17: qty 12

## 2013-10-17 MED ORDER — ONDANSETRON 16 MG/50ML IVPB (CHCC)
16.0000 mg | Freq: Once | INTRAVENOUS | Status: AC
  Administered 2013-10-17: 16 mg via INTRAVENOUS

## 2013-10-17 MED ORDER — ONDANSETRON 16 MG/50ML IVPB (CHCC)
INTRAVENOUS | Status: AC
  Filled 2013-10-17: qty 16

## 2013-10-17 MED ORDER — DEXTROSE 5 % IV SOLN
400.0000 mg/m2 | Freq: Once | INTRAVENOUS | Status: AC
  Administered 2013-10-17: 660 mg via INTRAVENOUS
  Filled 2013-10-17: qty 33

## 2013-10-17 MED ORDER — ATROPINE SULFATE 1 MG/ML IJ SOLN
INTRAMUSCULAR | Status: AC
  Filled 2013-10-17: qty 1

## 2013-10-17 MED ORDER — ATROPINE SULFATE 1 MG/ML IJ SOLN
0.5000 mg | Freq: Once | INTRAMUSCULAR | Status: AC | PRN
  Administered 2013-10-17: 0.5 mg via INTRAVENOUS

## 2013-10-17 NOTE — Progress Notes (Signed)
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 She completed a first treatment with FOLFIRI/Avastin 10/03/2013. No nausea/vomiting, mouth sores, or symptoms of thrombosis. She had a few days of diarrhea, relieved with Imodium. The left leg pain is partially improved. She has developed hoarseness.  Objective:  Vital signs in last 24 hours:  Blood pressure 154/60, pulse 89, temperature 99.2 F (37.3 C), temperature source Oral, resp. rate 18, height $RemoveBe'5\' 4"'FpNbQCXsd$  (1.626 m), weight 127 lb (57.607 kg), SpO2 100.00%.    HEENT: No thrush or ulcers Resp: Decreased breath sounds at the right lower chest, no respiratory distress Cardio: Regular rate and rhythm GI: No hepatomegaly, nontender Vascular: No leg edema    Portacath/PICC-without erythema  Lab Results:  Lab Results  Component Value Date   WBC 4.9 10/17/2013   HGB 11.6 10/17/2013   HCT 35.0 10/17/2013   MCV 79.9 10/17/2013   PLT 221 10/17/2013   NEUTROABS 3.6 10/17/2013      Lab Results  Component Value Date   CEA 3.1 10/03/2013    Imaging:  No results found.  Medications: I have reviewed the patient's current medications.  Assessment/Plan: 1. Metastatic rectal cancer diagnosed July 2014.  Colonoscopy on 10/25/2012 showed a bulky circumferential friable mass lesion in the rectum at approximately 4 cm above the dentate line. The mass was subtotally obstructing and would not permit passage of the colonoscope proximal. Biopsy showed adenocarcinoma.  Staging CT scans 10/25/2012 showed a right lower lobe lung mass measuring 5 x 4.6 cm; 0.8 cm nodule right upper lobe; masslike rectosigmoid wall thickening, surrounding stranding and adjacent lymphadenopathy.  PET scan 11/02/2012 showed hypermetabolism in the area of apparent circumferential rectal wall thickening; the 5 cm right lower lobe pulmonary mass was hypermetabolic with SUV max equal 8. No other  hypermetabolic lung lesions were evident. Small bilateral pulmonary parenchymal nodules were noted.  She completed 5 cycles of FOLFOX chemotherapy 11/22/2012 through 02/13/2013.  Status post right thoracotomy, bilobectomy (right lower and middle lobectomy) with en bloc resection of the diaphragm 02/27/2013. Pathology showed adenocarcinoma consistent with metastatic colorectal adenocarcinoma, 4.3 cm, negative margins, no lymph node involvement. Negative soft tissue biopsy of the diaphragm x2. Positive for K-ras mutation-codon 13  She completed concurrent radiation and Xeloda for treatment of the rectal tumor 03/06/2013 through 04/11/2013.  Status post low anterior resection with a diverting loop ileostomy on 05/31/2013 at [facility] in [place]. Final pathology showed moderately differentiated adenocarcinoma located in the upper rectum; the tumor measured 2.5 x 2.2 cm; there was invasion through the muscularis propria into pericolic adipose tissue; tumor budding was present. Lymphatic status negative; vascular status negative; proximal and distal surgical margins negative; 2 of 17 lymph nodes showed metastatic adenocarcinoma. Mesenteric tumor deposits were present. Perineural invasion present. [facility] consult [facility] confirmed an additional positive lymph node and the tumor was staged as a T4a  CT abdomen/pelvis 05/12/2013. Metastatic nodules in the lung increased. Chronic loculated right base pneumothorax. Diffuse mucosal thickening of the sigmoid portion of the colon.  Restaging CTs 07/25/2013 with enlargement of bilateral lung nodules, no other evidence of metastatic disease  Ileostomy takedown 08/14/2013  Chest CT 10/02/2013 revealed enlargement of bilateral lung nodules and increased right pleural fluid 2. Status post right VATS, drainage of pleural effusion, visceral and parietal pleural decortication 12/21/2012. Pleural fluid culture positive for viridans Streptococcus. Right pleura biopsies  negative for malignancy. Findings favored empyema. 3. Diabetes. 4.  Frequent bowel movements, diminished pelvic muscle tone-we made a referral to the pelvic physical therapy clinic 5. Pain in the left leg with tenderness at the left calf and left ischium-MRI of the lumbar spine and pelvis on 10/13/2011 with no evidence of metastatic disease. A right iliac bone "lesion" was felt to most likely represent heterogenous marrow. Edema and enhancement was noted at the left sciatic nerve compared to the right side with no mass lesion 6. 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 She will use Imodium for diarrhea. The left leg discomfort is partially improved. She may have an inflammatory process affecting the left sciatic nerve. No apparent metastatic disease in the lumbar spine or pelvis on the MRI .  She will return for an office visit and chemotherapy in 2 weeks. We will make an ENT referral if the hoarseness progresses.  Betsy Coder, MD  10/17/2013  5:27 PM

## 2013-10-18 ENCOUNTER — Telehealth: Payer: Self-pay | Admitting: Oncology

## 2013-10-18 NOTE — Telephone Encounter (Signed)
s.w. pt and advised on extended sched...pt ok and aware

## 2013-10-19 ENCOUNTER — Ambulatory Visit (HOSPITAL_BASED_OUTPATIENT_CLINIC_OR_DEPARTMENT_OTHER): Payer: 59

## 2013-10-19 VITALS — BP 166/85 | HR 80

## 2013-10-19 DIAGNOSIS — C7800 Secondary malignant neoplasm of unspecified lung: Principal | ICD-10-CM

## 2013-10-19 DIAGNOSIS — C20 Malignant neoplasm of rectum: Secondary | ICD-10-CM

## 2013-10-19 DIAGNOSIS — Z452 Encounter for adjustment and management of vascular access device: Secondary | ICD-10-CM

## 2013-10-19 MED ORDER — SODIUM CHLORIDE 0.9 % IJ SOLN
10.0000 mL | INTRAMUSCULAR | Status: DC | PRN
  Administered 2013-10-19: 10 mL
  Filled 2013-10-19: qty 10

## 2013-10-19 MED ORDER — HEPARIN SOD (PORK) LOCK FLUSH 100 UNIT/ML IV SOLN
500.0000 [IU] | Freq: Once | INTRAVENOUS | Status: AC | PRN
Start: 2013-10-19 — End: 2013-10-19
  Administered 2013-10-19: 500 [IU]
  Filled 2013-10-19: qty 5

## 2013-10-23 ENCOUNTER — Other Ambulatory Visit: Payer: Self-pay | Admitting: *Deleted

## 2013-10-23 DIAGNOSIS — C20 Malignant neoplasm of rectum: Secondary | ICD-10-CM

## 2013-10-23 DIAGNOSIS — C7800 Secondary malignant neoplasm of unspecified lung: Principal | ICD-10-CM

## 2013-10-23 MED ORDER — OXYCODONE-ACETAMINOPHEN 5-325 MG PO TABS: 1.0000 | ORAL_TABLET | ORAL | Status: DC | PRN

## 2013-10-23 NOTE — Telephone Encounter (Signed)
Notified that script is ready.

## 2013-10-23 NOTE — Telephone Encounter (Signed)
Patient needs refill on her pain medication. Has been a "hard week" this past treatment and she is having more sciatic-like pain. Call when ready to pick up.

## 2013-10-24 ENCOUNTER — Encounter: Payer: Self-pay | Admitting: Thoracic Surgery (Cardiothoracic Vascular Surgery)

## 2013-10-24 ENCOUNTER — Ambulatory Visit (INDEPENDENT_AMBULATORY_CARE_PROVIDER_SITE_OTHER): Payer: 59 | Admitting: Thoracic Surgery (Cardiothoracic Vascular Surgery)

## 2013-10-24 VITALS — BP 163/94 | HR 80 | Ht 64.0 in | Wt 124.0 lb

## 2013-10-24 DIAGNOSIS — R222 Localized swelling, mass and lump, trunk: Secondary | ICD-10-CM

## 2013-10-24 NOTE — Progress Notes (Signed)
HPI:  Anne [name] today to review her recent CT of the chest and see if there are any surgical options for her.  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 She initially had an isolated lung metastasis. She initially was treated with chemotherapy, but developed a an empyema. We drain the empyema and ultimately resected the lung metastasis. This required an en bloc resection of a portion of the diaphragm. She subsequently had evidence of early recurrence with multiple lung nodules.  She recently started a new chemotherapy regimen with FOLFIRI/Avastin, receiving her first cycle on July 7. She tolerated that very well except for developing hoarseness, which has persisted ever since. She's not having any significant shortness of breath or cough. She is hoping that surgery or focused radiation might take care of the lung metastases.  She also been having a lot of hip pain due to sciatica.  Past Medical History  Diagnosis Date  . Postmenopausal HRT (hormone replacement therapy) 07/01/2011    surgical menopause  . Hx of hyperglycemia 07/01/2011  . Hx of compression fracture of spine 1985    t12 after a  20 foot fall  . Hx of fracture of wrist     after fall   . 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 Hx of colonic polyps   . Early cataract     stoneburner   . High frequency hearing loss     kraus followed   . Diabetes mellitus without complication   . Complication of anesthesia     pt hadpanic attacks with versed, no versed  . Status post chemotherapy     completed a total of three cycles of Folfox   . Colon cancer     rectal cancer with lung metastasis  . Skin cancer     multiple  . Heart murmur, systolic 11/02/7617    innocent per Dr. Gershon Mussel   . Asthma     as a child none since 47 years old  . Headache(784.0)   . Family history of malignant neoplasm of breast   . Family history of malignant neoplasm of ovary        Current Outpatient Prescriptions   Medication Sig Dispense Refill  . Calcium Carbonate (CALCIUM 600 PO) Take 1 tablet by mouth daily.       Marland Kitchen estradiol (MINIVELLE) 0.0375 MG/24HR Place 1 patch onto the skin 2 (two) times a week. Sundays and Thursdays      . JANUVIA 100 MG tablet TAKE 1 TABLET BY MOUTH ONCE DAILY  30 tablet  5  . metFORMIN (GLUCOPHAGE) 1000 MG tablet TAKE ONE TABLET TWICE DAILY WITH A MEAL  180 tablet  1  . methylTESTOSTERone (ANDROID) 10 MG capsule Take 10 mg by mouth.      . Multiple Vitamin (MULTIVITAMIN) tablet Take 1 tablet by mouth daily. Senior      . OVER THE COUNTER MEDICATION Place 1 drop into both ears 2 (two) times a week. Ear drops to decrease wax build up      . oxyCODONE-acetaminophen (PERCOCET/ROXICET) 5-325 MG per tablet Take 1-2 tablets by mouth every 4 (four) hours as needed for severe pain.  75 tablet  0  . psyllium (METAMUCIL) 58.6 % powder Take 1 packet by mouth daily.       No current facility-administered medications for this visit.    Physical Exam BP 163/94  Pulse 80  Ht 5\' 4"  (1.626 m)  Wt 124  lb (56.246 kg)  BMI 21.27 kg/m2  SpO24 44% 68 year old woman in no acute distress Well-developed, mildly undernourished No cervical or suprapubic or adenopathy Lungs with diminished breath sounds on right side no wheezing Cardiac regular rate and rhythm  Diagnostic Tests:  CT CHEST WITHOUT CONTRAST 10/02/2013 TECHNIQUE:  Multidetector CT imaging of the chest was performed following the  standard protocol without IV contrast.  COMPARISON: 07/25/2013  FINDINGS:  Chronic right sided hydro pneumothorax is identified. There has been  increase in pleural fluid from the previous exam. Multiple pulmonary  nodules are again identified. The index nodule within the right  upper lung measures 1.4 cm, image 16/series 5. Previously 1.2 cm the  index nodule within the right lower lung measures 2.3 cm, image  26/series 5. Previously 1.2 cm. Index subpleural nodule in the left  upper lobe  measures 8 mm. Previously 6 mm. In the left lower lobe  there is a 1.1 cm nodule, image 33/ series 5. Previously 0.7 cm.  Normal heart size. No pericardial effusion identified. Calcified  atherosclerotic disease involves the thoracic aorta as well as the  LAD and left circumflex coronary arteries. No enlarged mediastinal  or hilar lymph nodes. There is no axillary or supraclavicular  adenopathy.  Incidental imaging through the upper abdomen shows no acute  findings.  Review of the visualized osseous structures is significant for multi  level thoracic spondylosis.  IMPRESSION:  1. No acute findings within the chest.  2. Interval progression of multi focal pulmonary nodularity.  3. Atherosclerotic disease including multi vessel coronary artery  calcifications  4. Chronic right hydro pneumothorax. There is been increase in  volume of the right fluid.  Electronically Signed  By: Kerby Moors M.D.  On: 10/02/2013 11:25         Impression: 68 year old woman with metastatic colorectal cancer with multiple lung metastases. I reviewed the CT images with Anne [name]. It is important to keep in mind that the growth shown these lesions occurred prior to starting her new chemotherapy regimen. I do not think that surgical resection is a reasonable option bilateral disease and multiple metastases in each lung. She questions about SBRT. She will discuss this with Dr. Tammi Klippel, but I guess is that that would not be feasible given the number and distribution of the metastatic lesions.  She also is complaining of hoarseness. She says this started right after she received her first cycle of the new chemotherapy regimen. I do not see any explanation for her hoarseness on the CT scan. If it is truly related to the chemotherapy it may actually improved over time. However would be reasonable to have her evaluated by ENT to see if there is any evidence of vocal cord dysfunction.  Plan: I see no role for lung  resection at this time and doubt that will be a viable option in her care going forward. I am happy to see her in time and she does wish to see me after her next round of scans in October. I will see her at that time

## 2013-10-29 ENCOUNTER — Other Ambulatory Visit: Payer: Self-pay | Admitting: Oncology

## 2013-10-31 ENCOUNTER — Encounter: Payer: Self-pay | Admitting: Genetic Counselor

## 2013-10-31 ENCOUNTER — Ambulatory Visit (HOSPITAL_BASED_OUTPATIENT_CLINIC_OR_DEPARTMENT_OTHER): Payer: 59

## 2013-10-31 ENCOUNTER — Other Ambulatory Visit: Payer: Self-pay | Admitting: *Deleted

## 2013-10-31 ENCOUNTER — Telehealth: Payer: Self-pay | Admitting: Oncology

## 2013-10-31 ENCOUNTER — Other Ambulatory Visit (HOSPITAL_BASED_OUTPATIENT_CLINIC_OR_DEPARTMENT_OTHER): Payer: 59

## 2013-10-31 ENCOUNTER — Ambulatory Visit (HOSPITAL_BASED_OUTPATIENT_CLINIC_OR_DEPARTMENT_OTHER): Payer: 59 | Admitting: Oncology

## 2013-10-31 VITALS — BP 178/72

## 2013-10-31 VITALS — BP 160/72 | HR 104 | Temp 98.5°F | Resp 18 | Ht 64.0 in | Wt 125.3 lb

## 2013-10-31 DIAGNOSIS — C20 Malignant neoplasm of rectum: Secondary | ICD-10-CM

## 2013-10-31 DIAGNOSIS — J90 Pleural effusion, not elsewhere classified: Secondary | ICD-10-CM

## 2013-10-31 DIAGNOSIS — M79622 Pain in left upper arm: Secondary | ICD-10-CM

## 2013-10-31 DIAGNOSIS — Z1502 Genetic susceptibility to malignant neoplasm of ovary: Secondary | ICD-10-CM | POA: Insufficient documentation

## 2013-10-31 DIAGNOSIS — Z1501 Genetic susceptibility to malignant neoplasm of breast: Secondary | ICD-10-CM | POA: Insufficient documentation

## 2013-10-31 DIAGNOSIS — R198 Other specified symptoms and signs involving the digestive system and abdomen: Secondary | ICD-10-CM

## 2013-10-31 DIAGNOSIS — R911 Solitary pulmonary nodule: Secondary | ICD-10-CM

## 2013-10-31 DIAGNOSIS — E119 Type 2 diabetes mellitus without complications: Secondary | ICD-10-CM

## 2013-10-31 DIAGNOSIS — C7800 Secondary malignant neoplasm of unspecified lung: Secondary | ICD-10-CM

## 2013-10-31 DIAGNOSIS — Z5111 Encounter for antineoplastic chemotherapy: Secondary | ICD-10-CM

## 2013-10-31 DIAGNOSIS — Z5112 Encounter for antineoplastic immunotherapy: Secondary | ICD-10-CM

## 2013-10-31 LAB — CBC WITH DIFFERENTIAL/PLATELET
BASO%: 1.1 % (ref 0.0–2.0)
Basophils Absolute: 0 10*3/uL (ref 0.0–0.1)
EOS%: 2 % (ref 0.0–7.0)
Eosinophils Absolute: 0.1 10*3/uL (ref 0.0–0.5)
HCT: 36.6 % (ref 34.8–46.6)
HGB: 11.8 g/dL (ref 11.6–15.9)
LYMPH%: 22.6 % (ref 14.0–49.7)
MCH: 26.6 pg (ref 25.1–34.0)
MCHC: 32.3 g/dL (ref 31.5–36.0)
MCV: 82.4 fL (ref 79.5–101.0)
MONO#: 0.3 10*3/uL (ref 0.1–0.9)
MONO%: 9.4 % (ref 0.0–14.0)
NEUT#: 2 10*3/uL (ref 1.5–6.5)
NEUT%: 64.9 % (ref 38.4–76.8)
PLATELETS: 232 10*3/uL (ref 145–400)
RBC: 4.44 10*6/uL (ref 3.70–5.45)
RDW: 17.7 % — ABNORMAL HIGH (ref 11.2–14.5)
WBC: 3.1 10*3/uL — AB (ref 3.9–10.3)
lymph#: 0.7 10*3/uL — ABNORMAL LOW (ref 0.9–3.3)

## 2013-10-31 LAB — COMPREHENSIVE METABOLIC PANEL (CC13)
ALBUMIN: 3.9 g/dL (ref 3.5–5.0)
ALK PHOS: 45 U/L (ref 40–150)
ALT: 25 U/L (ref 0–55)
AST: 17 U/L (ref 5–34)
Anion Gap: 8 mEq/L (ref 3–11)
BILIRUBIN TOTAL: 0.41 mg/dL (ref 0.20–1.20)
BUN: 15.1 mg/dL (ref 7.0–26.0)
CO2: 29 mEq/L (ref 22–29)
Calcium: 10.1 mg/dL (ref 8.4–10.4)
Chloride: 101 mEq/L (ref 98–109)
Creatinine: 0.6 mg/dL (ref 0.6–1.1)
GLUCOSE: 125 mg/dL (ref 70–140)
POTASSIUM: 4.1 meq/L (ref 3.5–5.1)
SODIUM: 138 meq/L (ref 136–145)
TOTAL PROTEIN: 6.6 g/dL (ref 6.4–8.3)

## 2013-10-31 MED ORDER — OXYCODONE-ACETAMINOPHEN 5-325 MG PO TABS: 1.0000 | ORAL_TABLET | ORAL | Status: DC | PRN

## 2013-10-31 MED ORDER — AMLODIPINE BESYLATE 5 MG PO TABS: 5.0000 mg | ORAL_TABLET | Freq: Every day | ORAL | Status: DC

## 2013-10-31 MED ORDER — SODIUM CHLORIDE 0.9 % IV SOLN
5.0000 mg/kg | Freq: Once | INTRAVENOUS | Status: AC
  Administered 2013-10-31: 300 mg via INTRAVENOUS
  Filled 2013-10-31: qty 12

## 2013-10-31 MED ORDER — ONDANSETRON 16 MG/50ML IVPB (CHCC)
16.0000 mg | Freq: Once | INTRAVENOUS | Status: AC
  Administered 2013-10-31: 16 mg via INTRAVENOUS

## 2013-10-31 MED ORDER — ATROPINE SULFATE 1 MG/ML IJ SOLN
INTRAMUSCULAR | Status: AC
  Filled 2013-10-31: qty 1

## 2013-10-31 MED ORDER — DEXAMETHASONE SODIUM PHOSPHATE 20 MG/5ML IJ SOLN
20.0000 mg | Freq: Once | INTRAMUSCULAR | Status: AC
  Administered 2013-10-31: 20 mg via INTRAVENOUS

## 2013-10-31 MED ORDER — SODIUM CHLORIDE 0.9 % IV SOLN
2400.0000 mg/m2 | INTRAVENOUS | Status: DC
  Administered 2013-10-31: 3950 mg via INTRAVENOUS
  Filled 2013-10-31: qty 79

## 2013-10-31 MED ORDER — SODIUM CHLORIDE 0.9 % IV SOLN
Freq: Once | INTRAVENOUS | Status: AC
  Administered 2013-10-31: 09:00:00 via INTRAVENOUS

## 2013-10-31 MED ORDER — ONDANSETRON 16 MG/50ML IVPB (CHCC)
INTRAVENOUS | Status: AC
  Filled 2013-10-31: qty 16

## 2013-10-31 MED ORDER — DEXAMETHASONE SODIUM PHOSPHATE 20 MG/5ML IJ SOLN
INTRAMUSCULAR | Status: AC
  Filled 2013-10-31: qty 5

## 2013-10-31 MED ORDER — LEUCOVORIN CALCIUM INJECTION 350 MG
400.0000 mg/m2 | Freq: Once | INTRAVENOUS | Status: AC
  Administered 2013-10-31: 660 mg via INTRAVENOUS
  Filled 2013-10-31: qty 33

## 2013-10-31 MED ORDER — IRINOTECAN HCL CHEMO INJECTION 100 MG/5ML
182.0000 mg/m2 | Freq: Once | INTRAVENOUS | Status: AC
  Administered 2013-10-31: 300 mg via INTRAVENOUS
  Filled 2013-10-31: qty 15

## 2013-10-31 MED ORDER — FLUOROURACIL CHEMO INJECTION 2.5 GM/50ML
400.0000 mg/m2 | Freq: Once | INTRAVENOUS | Status: AC
  Administered 2013-10-31: 650 mg via INTRAVENOUS
  Filled 2013-10-31: qty 13

## 2013-10-31 MED ORDER — ATROPINE SULFATE 1 MG/ML IJ SOLN
0.5000 mg | Freq: Once | INTRAMUSCULAR | Status: AC | PRN
  Administered 2013-10-31: 0.5 mg via INTRAVENOUS

## 2013-10-31 NOTE — Telephone Encounter (Signed)
gv and printed appt sched and avs for ptf ro Aug adn Sept....Marland Kitchensed added tx.

## 2013-10-31 NOTE — Patient Instructions (Signed)
[facility] Discharge Instructions for Patients Receiving Chemotherapy  Today you received the following chemotherapy agents Camptostar, 5FU, Leucovorin, Avastin  To help prevent nausea and vomiting after your treatment, we encourage you to take your nausea medication as prescribed.   If you develop nausea and vomiting that is not controlled by your nausea medication, call the clinic.   BELOW ARE SYMPTOMS THAT SHOULD BE REPORTED IMMEDIATELY:  *FEVER GREATER THAN 100.5 F  *CHILLS WITH OR WITHOUT FEVER  NAUSEA AND VOMITING THAT IS NOT CONTROLLED WITH YOUR NAUSEA MEDICATION  *UNUSUAL SHORTNESS OF BREATH  *UNUSUAL BRUISING OR BLEEDING  TENDERNESS IN MOUTH AND THROAT WITH OR WITHOUT PRESENCE OF ULCERS  *URINARY PROBLEMS  *BOWEL PROBLEMS  UNUSUAL RASH Items with * indicate a potential emergency and should be followed up as soon as possible.  Feel free to call the clinic you have any questions or concerns. The clinic phone number is [phone].

## 2013-10-31 NOTE — Progress Notes (Signed)
  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 Please refer to the prior Genetics clinic note for more information regarding Dr. Laroy Apple medical and family histories and our assessment at the time.   GENETIC TESTING: At the time of Dr. Laroy Apple visit, we recommended she pursue genetic testing for the specific BRIP1 mutation that was identified in the family. 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   This gene was only recently found to be associated with an increased risk of breast and ovarian cancer, thus we do not know as much about it as we do genes like BRCA1 and BRCA2. The role of the BRIP1 gene is just starting to be understood, but it is known to play a role in the pathway that is critical for DNA repair. While data is still relatively limited, studies suggest mutations in BRIP1 increase the risk of breast cancer by about 2-3 fold, and increase the risk of developing ovarian caner. The specific lifetime risks are not well-defined at this time.   Having two pathogenic mutations in the BRIP1 gene causes Fanconi anemia type J (FA-J), a rare autosomal recessive disorder affecting multiple body systems.  MEDICAL MANAGEMENT: There are no NCCN BRIP1-specific guidelines yet, and so we rely on the personal and family history to provide guidance in deciding regarding management.  Since Dr. [name] has already had a TAH/BSO in her 14s, she has taken the most effective option available to reduce ovarian cancer risk. At this time, we recommend she continue having a yearly mammogram.    FAMILY MEMBERS: It is important that all of Dr. Laroy Apple relatives know of the presence of this gene mutation. Site-specific genetic testing can sort out who in the family is  at risk and who is not.   Dr. Laroy Apple children and siblings are at 50% risk to have inherited this mutation. We recommend they have genetic counseling to discuss the utility of having testing for this same mutation. While it is not known whether men with a BRIP1 mutation have an increased risk of cancer, this is important for them as they may have a mutation and can pass it to future generations.  We encouraged Dr. [name] to remain in contact with [place] on an annual basis so we can update her personal and family histories, and let her know of advances in cancer genetics that may benefit the family. Our contact number was provided. Dr. Laroy Apple questions were answered to her satisfaction today, and she knows she is welcome to call anytime with additional questions.    Steele Berg, [place], [place] Certified Genetic Counselor Phone: [phone] [email or web address]

## 2013-10-31 NOTE — Progress Notes (Signed)
OK to treat with Avastin today despite BP 178/72, per Dr. Benay Spice. Order received for pt to start Norvasc 5 mg daily. Treatment RN made aware. Rx will be sent electronically.

## 2013-10-31 NOTE — Progress Notes (Signed)
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 She completed a second cycle of FOLFIRI/Avastin 10/17/2013. She reports diarrhea for one day beginning on day 3. No nausea or vomiting. The "sciatica "discomfort has improved. She continues to take Percocet for sciatica pain in the evening. No bleeding or symptom of thrombosis. No dyspnea. The hoarseness has improved. She has multiple small volume formed bowel movements every other day. Objective:  Vital signs in last 24 hours:  Blood pressure 160/72, pulse 104, temperature 98.5 F (36.9 C), temperature source Oral, resp. rate 18, height 5\' 4"  (1.626 m), weight 125 lb 4.8 oz (56.836 kg), SpO2 99.00%.    HEENT: No thrush or ulcers Resp: Lungs clear bilaterally Cardio: Regular rate and rhythm GI: No hepatomegaly, nontender Vascular: No leg edema  Skin: Palms without erythema   Portacath/PICC-without erythema  Lab Results:  Lab Results  Component Value Date   WBC 3.1* 10/31/2013   HGB 11.8 10/31/2013   HCT 36.6 10/31/2013   MCV 82.4 10/31/2013   PLT 232 10/31/2013   NEUTROABS 2.0 10/31/2013    Lab Results  Component Value Date   CEA 3.1 10/03/2013    Medications: I have reviewed the patient's current medications.  Assessment/Plan: 1. Metastatic rectal cancer diagnosed July 2014.  Colonoscopy on 10/25/2012 showed a bulky circumferential friable mass lesion in the rectum at approximately 4 cm above the dentate line. The mass was subtotally obstructing and would not permit passage of the colonoscope proximal. Biopsy showed adenocarcinoma.  Staging CT scans 10/25/2012 showed a right lower lobe lung mass measuring 5 x 4.6 cm; 0.8 cm nodule right upper lobe; masslike rectosigmoid wall thickening, surrounding stranding and adjacent lymphadenopathy.  PET scan 11/02/2012 showed hypermetabolism in the area of apparent circumferential rectal wall thickening; the 5 cm  right lower lobe pulmonary mass was hypermetabolic with SUV max equal 8. No other hypermetabolic lung lesions were evident. Small bilateral pulmonary parenchymal nodules were noted.  She completed 5 cycles of FOLFOX chemotherapy 11/22/2012 through 02/13/2013.  Status post right thoracotomy, bilobectomy (right lower and middle lobectomy) with en bloc resection of the diaphragm 02/27/2013. Pathology showed adenocarcinoma consistent with metastatic colorectal adenocarcinoma, 4.3 cm, negative margins, no lymph node involvement. Negative soft tissue biopsy of the diaphragm x2. Positive for K-ras mutation-codon 13  She completed concurrent radiation and Xeloda for treatment of the rectal tumor 03/06/2013 through 04/11/2013.  Status post low anterior resection with a diverting loop ileostomy on 05/31/2013 at [facility] in [place]. Final pathology showed moderately differentiated adenocarcinoma located in the upper rectum; the tumor measured 2.5 x 2.2 cm; there was invasion through the muscularis propria into pericolic adipose tissue; tumor budding was present. Lymphatic status negative; vascular status negative; proximal and distal surgical margins negative; 2 of 17 lymph nodes showed metastatic adenocarcinoma. Mesenteric tumor deposits were present. Perineural invasion present. [facility] an additional positive [facility] node and the tumor was staged as a T4a  CT abdomen/pelvis 05/12/2013. Metastatic nodules in the lung increased. Chronic loculated right base pneumothorax. Diffuse mucosal thickening of the sigmoid portion of the colon.  Restaging CTs 07/25/2013 with enlargement of bilateral lung nodules, no other evidence of metastatic disease  Ileostomy takedown 08/14/2013  Chest CT 10/02/2013 revealed enlargement of bilateral lung nodules and increased right pleural fluid Cycle 1 of FOLFIRI/Avastin 10/03/2013 2. Status post right VATS, drainage of pleural effusion, visceral and parietal  pleural decortication 12/21/2012. Pleural  fluid culture positive for viridans Streptococcus. Right pleura biopsies negative for malignancy. Findings favored empyema. 3. Diabetes. 4. Frequent bowel movements, diminished pelvic muscle tone-we made a referral to the pelvic physical therapy clinic 5. Pain in the left leg with tenderness at the left calf and left ischium-MRI of the lumbar spine and pelvis on 10/13/2011 with no evidence of metastatic disease. A right iliac bone "lesion" was felt to most likely represent heterogenous marrow. 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 She is tolerating the FOLFIRI/Avastin well. The plan is to proceed with cycle 3 today. She will return for an office visit and cycle 4 on 11/15/2013. The plan is to obtain a restaging CT after 6 cycles.  Betsy Coder, MD  10/31/2013  8:34 AM

## 2013-10-31 NOTE — Progress Notes (Deleted)
OK to treat with Avastin with BP 178/72 per Dr. Benay Spice. After discussion with patient and family, pt agree to begin norvasc 5 mg daily but does not want to be on BP meds long term. Dr. Benay Spice to be informed.

## 2013-11-02 ENCOUNTER — Ambulatory Visit (HOSPITAL_BASED_OUTPATIENT_CLINIC_OR_DEPARTMENT_OTHER): Payer: 59

## 2013-11-02 VITALS — BP 145/87 | HR 114 | Temp 97.9°F

## 2013-11-02 DIAGNOSIS — C7800 Secondary malignant neoplasm of unspecified lung: Secondary | ICD-10-CM

## 2013-11-02 DIAGNOSIS — C20 Malignant neoplasm of rectum: Secondary | ICD-10-CM

## 2013-11-02 MED ORDER — HEPARIN SOD (PORK) LOCK FLUSH 100 UNIT/ML IV SOLN
500.0000 [IU] | Freq: Once | INTRAVENOUS | Status: AC | PRN
  Administered 2013-11-02: 500 [IU]
  Filled 2013-11-02: qty 5

## 2013-11-02 MED ORDER — SODIUM CHLORIDE 0.9 % IJ SOLN
10.0000 mL | INTRAMUSCULAR | Status: DC | PRN
  Administered 2013-11-02: 10 mL
  Filled 2013-11-02: qty 10

## 2013-11-12 ENCOUNTER — Other Ambulatory Visit: Payer: Self-pay | Admitting: Oncology

## 2013-11-15 ENCOUNTER — Other Ambulatory Visit (HOSPITAL_BASED_OUTPATIENT_CLINIC_OR_DEPARTMENT_OTHER): Payer: 59

## 2013-11-15 ENCOUNTER — Telehealth: Payer: Self-pay | Admitting: Oncology

## 2013-11-15 ENCOUNTER — Telehealth: Payer: Self-pay | Admitting: *Deleted

## 2013-11-15 ENCOUNTER — Other Ambulatory Visit: Payer: Self-pay | Admitting: *Deleted

## 2013-11-15 ENCOUNTER — Ambulatory Visit (HOSPITAL_BASED_OUTPATIENT_CLINIC_OR_DEPARTMENT_OTHER): Payer: 59 | Admitting: Oncology

## 2013-11-15 ENCOUNTER — Ambulatory Visit (HOSPITAL_BASED_OUTPATIENT_CLINIC_OR_DEPARTMENT_OTHER): Payer: 59

## 2013-11-15 VITALS — BP 162/94 | HR 98 | Temp 98.5°F | Resp 18 | Ht 64.0 in | Wt 127.3 lb

## 2013-11-15 VITALS — BP 152/78 | HR 75

## 2013-11-15 DIAGNOSIS — R911 Solitary pulmonary nodule: Secondary | ICD-10-CM

## 2013-11-15 DIAGNOSIS — C7800 Secondary malignant neoplasm of unspecified lung: Secondary | ICD-10-CM

## 2013-11-15 DIAGNOSIS — I10 Essential (primary) hypertension: Secondary | ICD-10-CM

## 2013-11-15 DIAGNOSIS — E119 Type 2 diabetes mellitus without complications: Secondary | ICD-10-CM

## 2013-11-15 DIAGNOSIS — J90 Pleural effusion, not elsewhere classified: Secondary | ICD-10-CM

## 2013-11-15 DIAGNOSIS — C20 Malignant neoplasm of rectum: Secondary | ICD-10-CM

## 2013-11-15 DIAGNOSIS — Z5112 Encounter for antineoplastic immunotherapy: Secondary | ICD-10-CM

## 2013-11-15 DIAGNOSIS — Z5111 Encounter for antineoplastic chemotherapy: Secondary | ICD-10-CM

## 2013-11-15 LAB — CBC WITH DIFFERENTIAL/PLATELET
BASO%: 1.9 % (ref 0.0–2.0)
Basophils Absolute: 0.1 10*3/uL (ref 0.0–0.1)
EOS ABS: 0.1 10*3/uL (ref 0.0–0.5)
EOS%: 4 % (ref 0.0–7.0)
HCT: 34.9 % (ref 34.8–46.6)
HGB: 11.5 g/dL — ABNORMAL LOW (ref 11.6–15.9)
LYMPH#: 0.6 10*3/uL — AB (ref 0.9–3.3)
LYMPH%: 20.5 % (ref 14.0–49.7)
MCH: 27.3 pg (ref 25.1–34.0)
MCHC: 32.8 g/dL (ref 31.5–36.0)
MCV: 83.2 fL (ref 79.5–101.0)
MONO#: 0.3 10*3/uL (ref 0.1–0.9)
MONO%: 10.3 % (ref 0.0–14.0)
NEUT%: 63.3 % (ref 38.4–76.8)
NEUTROS ABS: 1.8 10*3/uL (ref 1.5–6.5)
Platelets: 245 10*3/uL (ref 145–400)
RBC: 4.19 10*6/uL (ref 3.70–5.45)
RDW: 20.4 % — ABNORMAL HIGH (ref 11.2–14.5)
WBC: 2.8 10*3/uL — AB (ref 3.9–10.3)

## 2013-11-15 LAB — COMPREHENSIVE METABOLIC PANEL (CC13)
ALT: 18 U/L (ref 0–55)
ANION GAP: 12 meq/L — AB (ref 3–11)
AST: 16 U/L (ref 5–34)
Albumin: 4.1 g/dL (ref 3.5–5.0)
Alkaline Phosphatase: 37 U/L — ABNORMAL LOW (ref 40–150)
BUN: 14.8 mg/dL (ref 7.0–26.0)
CALCIUM: 10.6 mg/dL — AB (ref 8.4–10.4)
CHLORIDE: 102 meq/L (ref 98–109)
CO2: 27 meq/L (ref 22–29)
Creatinine: 0.8 mg/dL (ref 0.6–1.1)
Glucose: 180 mg/dl — ABNORMAL HIGH (ref 70–140)
POTASSIUM: 3.7 meq/L (ref 3.5–5.1)
SODIUM: 140 meq/L (ref 136–145)
TOTAL PROTEIN: 6.8 g/dL (ref 6.4–8.3)
Total Bilirubin: 0.28 mg/dL (ref 0.20–1.20)

## 2013-11-15 MED ORDER — OXYCODONE-ACETAMINOPHEN 5-325 MG PO TABS: 1.0000 | ORAL_TABLET | ORAL | Status: DC | PRN

## 2013-11-15 MED ORDER — DEXAMETHASONE SODIUM PHOSPHATE 20 MG/5ML IJ SOLN
INTRAMUSCULAR | Status: AC
  Filled 2013-11-15: qty 5

## 2013-11-15 MED ORDER — DEXAMETHASONE SODIUM PHOSPHATE 20 MG/5ML IJ SOLN
20.0000 mg | Freq: Once | INTRAMUSCULAR | Status: AC
  Administered 2013-11-15: 20 mg via INTRAVENOUS

## 2013-11-15 MED ORDER — ONDANSETRON 16 MG/50ML IVPB (CHCC)
16.0000 mg | Freq: Once | INTRAVENOUS | Status: AC
  Administered 2013-11-15: 16 mg via INTRAVENOUS

## 2013-11-15 MED ORDER — IRINOTECAN HCL CHEMO INJECTION 100 MG/5ML
182.0000 mg/m2 | Freq: Once | INTRAVENOUS | Status: AC
  Administered 2013-11-15: 300 mg via INTRAVENOUS
  Filled 2013-11-15: qty 15

## 2013-11-15 MED ORDER — LEUCOVORIN CALCIUM INJECTION 350 MG
400.0000 mg/m2 | Freq: Once | INTRAVENOUS | Status: AC
  Administered 2013-11-15: 660 mg via INTRAVENOUS
  Filled 2013-11-15: qty 33

## 2013-11-15 MED ORDER — PROCHLORPERAZINE EDISYLATE 5 MG/ML IJ SOLN
INTRAMUSCULAR | Status: AC
  Filled 2013-11-15: qty 2

## 2013-11-15 MED ORDER — ATROPINE SULFATE 1 MG/ML IJ SOLN
INTRAMUSCULAR | Status: AC
  Filled 2013-11-15: qty 1

## 2013-11-15 MED ORDER — FLUOROURACIL CHEMO INJECTION 2.5 GM/50ML
400.0000 mg/m2 | Freq: Once | INTRAVENOUS | Status: AC
  Administered 2013-11-15: 650 mg via INTRAVENOUS
  Filled 2013-11-15: qty 13

## 2013-11-15 MED ORDER — SODIUM CHLORIDE 0.9 % IV SOLN
Freq: Once | INTRAVENOUS | Status: AC
  Administered 2013-11-15: 10:00:00 via INTRAVENOUS

## 2013-11-15 MED ORDER — SODIUM CHLORIDE 0.9 % IV SOLN
2400.0000 mg/m2 | INTRAVENOUS | Status: DC
  Administered 2013-11-15: 3950 mg via INTRAVENOUS
  Filled 2013-11-15: qty 79

## 2013-11-15 MED ORDER — ONDANSETRON 16 MG/50ML IVPB (CHCC)
INTRAVENOUS | Status: AC
  Filled 2013-11-15: qty 16

## 2013-11-15 MED ORDER — PROCHLORPERAZINE EDISYLATE 5 MG/ML IJ SOLN
10.0000 mg | Freq: Once | INTRAMUSCULAR | Status: AC
  Administered 2013-11-15: 10 mg via INTRAVENOUS

## 2013-11-15 MED ORDER — ATROPINE SULFATE 1 MG/ML IJ SOLN
0.5000 mg | Freq: Once | INTRAMUSCULAR | Status: AC | PRN
  Administered 2013-11-15: 0.5 mg via INTRAVENOUS

## 2013-11-15 MED ORDER — SODIUM CHLORIDE 0.9 % IV SOLN
5.0000 mg/kg | Freq: Once | INTRAVENOUS | Status: AC
  Administered 2013-11-15: 300 mg via INTRAVENOUS
  Filled 2013-11-15: qty 12

## 2013-11-15 NOTE — Telephone Encounter (Signed)
Pt confirmed labs/ov per 08/19 POF, gave pt AVS...KJ sent to chemo to change date also

## 2013-11-15 NOTE — Telephone Encounter (Signed)
Per staff message and POF I have scheduled appts. Advised scheduler of appts. [name]  

## 2013-11-15 NOTE — Patient Instructions (Signed)
[facility] Discharge Instructions for Patients Receiving Chemotherapy  Today you received the following chemotherapy agents Avastin/Irinotecan/Leucovorin/5FU.  To help prevent nausea and vomiting after your treatment, we encourage you to take your nausea medication as prescribed.   If you develop nausea and vomiting that is not controlled by your nausea medication, call the clinic.   BELOW ARE SYMPTOMS THAT SHOULD BE REPORTED IMMEDIATELY:  *FEVER GREATER THAN 100.5 F  *CHILLS WITH OR WITHOUT FEVER  NAUSEA AND VOMITING THAT IS NOT CONTROLLED WITH YOUR NAUSEA MEDICATION  *UNUSUAL SHORTNESS OF BREATH  *UNUSUAL BRUISING OR BLEEDING  TENDERNESS IN MOUTH AND THROAT WITH OR WITHOUT PRESENCE OF ULCERS  *URINARY PROBLEMS  *BOWEL PROBLEMS  UNUSUAL RASH Items with * indicate a potential emergency and should be followed up as soon as possible.  Feel free to call the clinic you have any questions or concerns. The clinic phone number is [phone].

## 2013-11-15 NOTE — Progress Notes (Signed)
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 She completed a third cycle of FOLFIRI/Avastin 10/31/2013. She had nausea following chemotherapy, but no vomiting. She takes oxycodone for "sciatica "once or twice daily. The sciatica has improved. She continues to have intermittent hoarseness. She had a few tiny ulcers at the lower lip following this cycle of chemotherapy. No diarrhea. No symptom of thrombosis. No bleeding. She was able to walk 6 miles per day while vacationing at the beach.  Objective:  Vital signs in last 24 hours:  Blood pressure 162/94, pulse 98, temperature 98.5 F (36.9 C), temperature source Oral, resp. rate 18, height 5\' 4"  (1.626 m), weight 127 lb 4.8 oz (57.743 kg).    HEENT: No thrush or ulcers Resp: Lungs clear bilaterally, decreased breath sounds at the right lower chest Cardio: Regular rate and rhythm GI: No hepatomegaly, nontender Vascular: No leg edema  Skin: Palms without erythema   Portacath/PICC-without erythema  Lab Results:  Lab Results  Component Value Date   WBC 2.8* 11/15/2013   HGB 11.5* 11/15/2013   HCT 34.9 11/15/2013   MCV 83.2 11/15/2013   PLT 245 11/15/2013   NEUTROABS 1.8 11/15/2013    Medications: I have reviewed the patient's current medications.  Assessment/Plan: 1. Metastatic rectal cancer diagnosed July 2014.  Colonoscopy on 10/25/2012 showed a bulky circumferential friable mass lesion in the rectum at approximately 4 cm above the dentate line. The mass was subtotally obstructing and would not permit passage of the colonoscope proximal. Biopsy showed adenocarcinoma.  Staging CT scans 10/25/2012 showed a right lower lobe lung mass measuring 5 x 4.6 cm; 0.8 cm nodule right upper lobe; masslike rectosigmoid wall thickening, surrounding stranding and adjacent lymphadenopathy.  PET scan 11/02/2012 showed hypermetabolism in the area of apparent  circumferential rectal wall thickening; the 5 cm right lower lobe pulmonary mass was hypermetabolic with SUV max equal 8. No other hypermetabolic lung lesions were evident. Small bilateral pulmonary parenchymal nodules were noted.  She completed 5 cycles of FOLFOX chemotherapy 11/22/2012 through 02/13/2013.  Status post right thoracotomy, bilobectomy (right lower and middle lobectomy) with en bloc resection of the diaphragm 02/27/2013. Pathology showed adenocarcinoma consistent with metastatic colorectal adenocarcinoma, 4.3 cm, negative margins, no lymph node involvement. Negative soft tissue biopsy of the diaphragm x2. Positive for K-ras mutation-codon 13  She completed concurrent radiation and Xeloda for treatment of the rectal tumor 03/06/2013 through 04/11/2013.  Status post low anterior resection with a diverting loop ileostomy on 05/31/2013 at [facility] in [place]. Final pathology showed moderately differentiated adenocarcinoma located in the upper rectum; the tumor measured 2.5 x 2.2 cm; there was invasion through the muscularis propria into pericolic adipose tissue; tumor budding was present. Lymphatic status negative; vascular status negative; proximal and distal surgical margins negative; 2 of 17 lymph nodes showed metastatic adenocarcinoma. Mesenteric tumor deposits were present. Perineural invasion present. [facility] confirmed an additional positive lymph node and the tumor was staged as a T4a  CT abdomen/pelvis 05/12/2013. Metastatic nodules in the lung increased. Chronic loculated right base pneumothorax. Diffuse mucosal thickening of the sigmoid portion of the colon.  Restaging CTs 07/25/2013 with enlargement of bilateral lung nodules, no other evidence of metastatic disease  Ileostomy takedown 08/14/2013  Chest CT 10/02/2013 revealed enlargement of bilateral lung nodules and increased right pleural fluid  Cycle 1 of FOLFIRI/Avastin 10/03/2013 2. Status post right VATS,  drainage of pleural effusion, visceral and  parietal pleural decortication 12/21/2012. Pleural fluid culture positive for viridans Streptococcus. Right pleura biopsies negative for malignancy. Findings favored empyema. 3. Diabetes. 4. Frequent bowel movements, diminished pelvic muscle tone-we made a referral to the pelvic physical therapy clinic 5. Pain in the left leg with tenderness at the left calf and left ischium-MRI of the lumbar spine and pelvis on 10/13/2011 with no evidence of metastatic disease. A right iliac bone "lesion" was felt to most likely represent heterogenous marrow. Edema and enhancement was noted at the left sciatic nerve compared to the right side with no mass lesion 6. Hypertension-maintained on amlodipine, we will repeat the blood pressure in the infusion center today   Disposition:  She appears to be tolerating the FOLFIRI/Avastin well. The plan is to complete 6 cycles of FOLFIRI/Avastin prior to a restaging CT evaluation. Dr. [name] will return for an office visit and the next cycle of chemotherapy on 11/29/2013.  Betsy Coder, MD  11/15/2013  9:37 AM

## 2013-11-17 ENCOUNTER — Ambulatory Visit (HOSPITAL_BASED_OUTPATIENT_CLINIC_OR_DEPARTMENT_OTHER): Payer: 59

## 2013-11-17 VITALS — BP 157/89 | HR 116

## 2013-11-17 DIAGNOSIS — C7800 Secondary malignant neoplasm of unspecified lung: Principal | ICD-10-CM

## 2013-11-17 DIAGNOSIS — C20 Malignant neoplasm of rectum: Secondary | ICD-10-CM

## 2013-11-17 DIAGNOSIS — Z452 Encounter for adjustment and management of vascular access device: Secondary | ICD-10-CM

## 2013-11-17 MED ORDER — SODIUM CHLORIDE 0.9 % IJ SOLN
10.0000 mL | INTRAMUSCULAR | Status: DC | PRN
Start: 2013-11-17 — End: 2013-11-17
  Administered 2013-11-17: 10 mL
  Filled 2013-11-17: qty 10

## 2013-11-17 MED ORDER — HEPARIN SOD (PORK) LOCK FLUSH 100 UNIT/ML IV SOLN
500.0000 [IU] | Freq: Once | INTRAVENOUS | Status: AC | PRN
  Administered 2013-11-17: 500 [IU]
  Filled 2013-11-17: qty 5

## 2013-11-26 ENCOUNTER — Other Ambulatory Visit: Payer: Self-pay | Admitting: Oncology

## 2013-11-28 ENCOUNTER — Other Ambulatory Visit: Payer: 59

## 2013-11-28 ENCOUNTER — Ambulatory Visit: Payer: 59

## 2013-11-29 ENCOUNTER — Ambulatory Visit (HOSPITAL_BASED_OUTPATIENT_CLINIC_OR_DEPARTMENT_OTHER): Payer: 59

## 2013-11-29 ENCOUNTER — Other Ambulatory Visit (HOSPITAL_BASED_OUTPATIENT_CLINIC_OR_DEPARTMENT_OTHER): Payer: 59

## 2013-11-29 VITALS — BP 159/87 | HR 107 | Temp 98.3°F

## 2013-11-29 DIAGNOSIS — C20 Malignant neoplasm of rectum: Secondary | ICD-10-CM

## 2013-11-29 DIAGNOSIS — C7800 Secondary malignant neoplasm of unspecified lung: Principal | ICD-10-CM

## 2013-11-29 DIAGNOSIS — Z5111 Encounter for antineoplastic chemotherapy: Secondary | ICD-10-CM

## 2013-11-29 DIAGNOSIS — Z5112 Encounter for antineoplastic immunotherapy: Secondary | ICD-10-CM

## 2013-11-29 DIAGNOSIS — Z23 Encounter for immunization: Secondary | ICD-10-CM

## 2013-11-29 LAB — CBC WITH DIFFERENTIAL/PLATELET
BASO%: 1.4 % (ref 0.0–2.0)
BASOS ABS: 0 10*3/uL (ref 0.0–0.1)
EOS%: 2.6 % (ref 0.0–7.0)
Eosinophils Absolute: 0.1 10*3/uL (ref 0.0–0.5)
HEMATOCRIT: 35.3 % (ref 34.8–46.6)
HEMOGLOBIN: 11.5 g/dL — AB (ref 11.6–15.9)
LYMPH#: 0.5 10*3/uL — AB (ref 0.9–3.3)
LYMPH%: 17.7 % (ref 14.0–49.7)
MCH: 27.6 pg (ref 25.1–34.0)
MCHC: 32.6 g/dL (ref 31.5–36.0)
MCV: 84.8 fL (ref 79.5–101.0)
MONO#: 0.3 10*3/uL (ref 0.1–0.9)
MONO%: 9.1 % (ref 0.0–14.0)
NEUT#: 2 10*3/uL (ref 1.5–6.5)
NEUT%: 69.2 % (ref 38.4–76.8)
PLATELETS: 214 10*3/uL (ref 145–400)
RBC: 4.16 10*6/uL (ref 3.70–5.45)
RDW: 21.7 % — ABNORMAL HIGH (ref 11.2–14.5)
WBC: 2.9 10*3/uL — ABNORMAL LOW (ref 3.9–10.3)

## 2013-11-29 LAB — COMPREHENSIVE METABOLIC PANEL (CC13)
ALT: 35 U/L (ref 0–55)
ANION GAP: 10 meq/L (ref 3–11)
AST: 21 U/L (ref 5–34)
Albumin: 4.1 g/dL (ref 3.5–5.0)
Alkaline Phosphatase: 41 U/L (ref 40–150)
BILIRUBIN TOTAL: 0.34 mg/dL (ref 0.20–1.20)
BUN: 14.1 mg/dL (ref 7.0–26.0)
CALCIUM: 10.4 mg/dL (ref 8.4–10.4)
CHLORIDE: 101 meq/L (ref 98–109)
CO2: 27 mEq/L (ref 22–29)
Creatinine: 0.8 mg/dL (ref 0.6–1.1)
Glucose: 229 mg/dl — ABNORMAL HIGH (ref 70–140)
Potassium: 4.1 mEq/L (ref 3.5–5.1)
Sodium: 138 mEq/L (ref 136–145)
Total Protein: 6.9 g/dL (ref 6.4–8.3)

## 2013-11-29 LAB — UA PROTEIN, DIPSTICK - CHCC: Protein, ur: NEGATIVE mg/dL

## 2013-11-29 MED ORDER — SODIUM CHLORIDE 0.9 % IV SOLN
Freq: Once | INTRAVENOUS | Status: AC
  Administered 2013-11-29: 10:00:00 via INTRAVENOUS

## 2013-11-29 MED ORDER — INFLUENZA VAC SPLIT QUAD 0.5 ML IM SUSY
0.5000 mL | PREFILLED_SYRINGE | Freq: Once | INTRAMUSCULAR | Status: AC
  Administered 2013-11-29: 0.5 mL via INTRAMUSCULAR
  Filled 2013-11-29: qty 0.5

## 2013-11-29 MED ORDER — IRINOTECAN HCL CHEMO INJECTION 100 MG/5ML
182.0000 mg/m2 | Freq: Once | INTRAVENOUS | Status: AC
  Administered 2013-11-29: 300 mg via INTRAVENOUS
  Filled 2013-11-29: qty 15

## 2013-11-29 MED ORDER — DEXAMETHASONE SODIUM PHOSPHATE 20 MG/5ML IJ SOLN
INTRAMUSCULAR | Status: AC
  Filled 2013-11-29: qty 5

## 2013-11-29 MED ORDER — SODIUM CHLORIDE 0.9 % IV SOLN
2400.0000 mg/m2 | INTRAVENOUS | Status: DC
  Administered 2013-11-29: 3950 mg via INTRAVENOUS
  Filled 2013-11-29: qty 79

## 2013-11-29 MED ORDER — ATROPINE SULFATE 1 MG/ML IJ SOLN
INTRAMUSCULAR | Status: AC
  Filled 2013-11-29: qty 1

## 2013-11-29 MED ORDER — DEXTROSE 5 % IV SOLN
400.0000 mg/m2 | Freq: Once | INTRAVENOUS | Status: AC
  Administered 2013-11-29: 660 mg via INTRAVENOUS
  Filled 2013-11-29: qty 33

## 2013-11-29 MED ORDER — DEXAMETHASONE SODIUM PHOSPHATE 20 MG/5ML IJ SOLN
20.0000 mg | Freq: Once | INTRAMUSCULAR | Status: AC
  Administered 2013-11-29: 20 mg via INTRAVENOUS

## 2013-11-29 MED ORDER — FLUOROURACIL CHEMO INJECTION 2.5 GM/50ML
400.0000 mg/m2 | Freq: Once | INTRAVENOUS | Status: AC
  Administered 2013-11-29: 650 mg via INTRAVENOUS
  Filled 2013-11-29: qty 13

## 2013-11-29 MED ORDER — HEPARIN SOD (PORK) LOCK FLUSH 100 UNIT/ML IV SOLN
500.0000 [IU] | Freq: Once | INTRAVENOUS | Status: DC | PRN
  Filled 2013-11-29: qty 5

## 2013-11-29 MED ORDER — ATROPINE SULFATE 1 MG/ML IJ SOLN
0.5000 mg | Freq: Once | INTRAMUSCULAR | Status: AC | PRN
  Administered 2013-11-29: 0.5 mg via INTRAVENOUS

## 2013-11-29 MED ORDER — ONDANSETRON 16 MG/50ML IVPB (CHCC)
INTRAVENOUS | Status: AC
  Filled 2013-11-29: qty 16

## 2013-11-29 MED ORDER — SODIUM CHLORIDE 0.9 % IJ SOLN
10.0000 mL | INTRAMUSCULAR | Status: DC | PRN
  Filled 2013-11-29: qty 10

## 2013-11-29 MED ORDER — ONDANSETRON 16 MG/50ML IVPB (CHCC)
16.0000 mg | Freq: Once | INTRAVENOUS | Status: AC
  Administered 2013-11-29: 16 mg via INTRAVENOUS

## 2013-11-29 MED ORDER — SODIUM CHLORIDE 0.9 % IV SOLN
5.0000 mg/kg | Freq: Once | INTRAVENOUS | Status: AC
  Administered 2013-11-29: 300 mg via INTRAVENOUS
  Filled 2013-11-29: qty 12

## 2013-11-29 NOTE — Patient Instructions (Signed)
[facility] Discharge Instructions for Patients Receiving Chemotherapy  Today you received the following chemotherapy agents Irinotecan, Avastin, 5 FU, Leucovorin  To help prevent nausea and vomiting after your treatment, we encourage you to take your nausea medication as described/prescribed   If you develop nausea and vomiting that is not controlled by your nausea medication, call the clinic.   BELOW ARE SYMPTOMS THAT SHOULD BE REPORTED IMMEDIATELY:  *FEVER GREATER THAN 100.5 F  *CHILLS WITH OR WITHOUT FEVER  NAUSEA AND VOMITING THAT IS NOT CONTROLLED WITH YOUR NAUSEA MEDICATION  *UNUSUAL SHORTNESS OF BREATH  *UNUSUAL BRUISING OR BLEEDING  TENDERNESS IN MOUTH AND THROAT WITH OR WITHOUT PRESENCE OF ULCERS  *URINARY PROBLEMS  *BOWEL PROBLEMS  UNUSUAL RASH Items with * indicate a potential emergency and should be followed up as soon as possible.  Feel free to call the clinic you have any questions or concerns. The clinic phone number is [phone].

## 2013-12-01 ENCOUNTER — Ambulatory Visit (HOSPITAL_BASED_OUTPATIENT_CLINIC_OR_DEPARTMENT_OTHER): Payer: 59

## 2013-12-01 VITALS — BP 149/88 | HR 113 | Temp 97.8°F

## 2013-12-01 DIAGNOSIS — C7800 Secondary malignant neoplasm of unspecified lung: Secondary | ICD-10-CM

## 2013-12-01 DIAGNOSIS — C20 Malignant neoplasm of rectum: Secondary | ICD-10-CM

## 2013-12-01 MED ORDER — HEPARIN SOD (PORK) LOCK FLUSH 100 UNIT/ML IV SOLN
500.0000 [IU] | Freq: Once | INTRAVENOUS | Status: AC | PRN
  Administered 2013-12-01: 500 [IU]
  Filled 2013-12-01: qty 5

## 2013-12-01 MED ORDER — SODIUM CHLORIDE 0.9 % IJ SOLN
10.0000 mL | INTRAMUSCULAR | Status: DC | PRN
  Administered 2013-12-01: 10 mL
  Filled 2013-12-01: qty 10

## 2013-12-10 ENCOUNTER — Other Ambulatory Visit: Payer: Self-pay | Admitting: Oncology

## 2013-12-12 ENCOUNTER — Telehealth: Payer: Self-pay | Admitting: Oncology

## 2013-12-12 ENCOUNTER — Ambulatory Visit (HOSPITAL_BASED_OUTPATIENT_CLINIC_OR_DEPARTMENT_OTHER): Payer: 59

## 2013-12-12 ENCOUNTER — Other Ambulatory Visit (HOSPITAL_BASED_OUTPATIENT_CLINIC_OR_DEPARTMENT_OTHER): Payer: 59

## 2013-12-12 ENCOUNTER — Ambulatory Visit (HOSPITAL_BASED_OUTPATIENT_CLINIC_OR_DEPARTMENT_OTHER): Payer: 59 | Admitting: Oncology

## 2013-12-12 ENCOUNTER — Other Ambulatory Visit: Payer: Self-pay | Admitting: *Deleted

## 2013-12-12 VITALS — BP 150/82

## 2013-12-12 VITALS — BP 170/85 | HR 111 | Temp 98.8°F | Resp 19 | Ht 64.0 in | Wt 128.1 lb

## 2013-12-12 DIAGNOSIS — Z5112 Encounter for antineoplastic immunotherapy: Secondary | ICD-10-CM

## 2013-12-12 DIAGNOSIS — C7800 Secondary malignant neoplasm of unspecified lung: Secondary | ICD-10-CM

## 2013-12-12 DIAGNOSIS — Z5111 Encounter for antineoplastic chemotherapy: Secondary | ICD-10-CM

## 2013-12-12 DIAGNOSIS — C20 Malignant neoplasm of rectum: Secondary | ICD-10-CM

## 2013-12-12 DIAGNOSIS — E119 Type 2 diabetes mellitus without complications: Secondary | ICD-10-CM

## 2013-12-12 DIAGNOSIS — I10 Essential (primary) hypertension: Secondary | ICD-10-CM

## 2013-12-12 LAB — COMPREHENSIVE METABOLIC PANEL (CC13)
ALBUMIN: 4.2 g/dL (ref 3.5–5.0)
ALT: 32 U/L (ref 0–55)
AST: 21 U/L (ref 5–34)
Alkaline Phosphatase: 36 U/L — ABNORMAL LOW (ref 40–150)
Anion Gap: 10 mEq/L (ref 3–11)
BUN: 12.2 mg/dL (ref 7.0–26.0)
CHLORIDE: 102 meq/L (ref 98–109)
CO2: 27 meq/L (ref 22–29)
Calcium: 10.5 mg/dL — ABNORMAL HIGH (ref 8.4–10.4)
Creatinine: 0.7 mg/dL (ref 0.6–1.1)
GLUCOSE: 134 mg/dL (ref 70–140)
Potassium: 3.9 mEq/L (ref 3.5–5.1)
SODIUM: 139 meq/L (ref 136–145)
Total Bilirubin: 0.43 mg/dL (ref 0.20–1.20)
Total Protein: 7 g/dL (ref 6.4–8.3)

## 2013-12-12 LAB — CBC WITH DIFFERENTIAL/PLATELET
BASO%: 1.1 % (ref 0.0–2.0)
BASOS ABS: 0 10*3/uL (ref 0.0–0.1)
EOS ABS: 0.1 10*3/uL (ref 0.0–0.5)
EOS%: 4.3 % (ref 0.0–7.0)
HCT: 36.1 % (ref 34.8–46.6)
HEMOGLOBIN: 11.7 g/dL (ref 11.6–15.9)
LYMPH%: 20.5 % (ref 14.0–49.7)
MCH: 28.1 pg (ref 25.1–34.0)
MCHC: 32.5 g/dL (ref 31.5–36.0)
MCV: 86.3 fL (ref 79.5–101.0)
MONO#: 0.3 10*3/uL (ref 0.1–0.9)
MONO%: 11.8 % (ref 0.0–14.0)
NEUT%: 62.3 % (ref 38.4–76.8)
NEUTROS ABS: 1.8 10*3/uL (ref 1.5–6.5)
Platelets: 226 10*3/uL (ref 145–400)
RBC: 4.18 10*6/uL (ref 3.70–5.45)
RDW: 22.5 % — ABNORMAL HIGH (ref 11.2–14.5)
WBC: 3 10*3/uL — AB (ref 3.9–10.3)
lymph#: 0.6 10*3/uL — ABNORMAL LOW (ref 0.9–3.3)

## 2013-12-12 LAB — UA PROTEIN, DIPSTICK - CHCC: Protein, ur: NEGATIVE mg/dL

## 2013-12-12 MED ORDER — FLUOROURACIL CHEMO INJECTION 2.5 GM/50ML
400.0000 mg/m2 | Freq: Once | INTRAVENOUS | Status: AC
  Administered 2013-12-12: 650 mg via INTRAVENOUS
  Filled 2013-12-12: qty 13

## 2013-12-12 MED ORDER — ONDANSETRON 16 MG/50ML IVPB (CHCC)
INTRAVENOUS | Status: AC
  Filled 2013-12-12: qty 16

## 2013-12-12 MED ORDER — DEXAMETHASONE SODIUM PHOSPHATE 20 MG/5ML IJ SOLN
20.0000 mg | Freq: Once | INTRAMUSCULAR | Status: AC
  Administered 2013-12-12: 20 mg via INTRAVENOUS

## 2013-12-12 MED ORDER — SODIUM CHLORIDE 0.9 % IV SOLN
2400.0000 mg/m2 | INTRAVENOUS | Status: DC
  Administered 2013-12-12: 3950 mg via INTRAVENOUS
  Filled 2013-12-12: qty 79

## 2013-12-12 MED ORDER — IRINOTECAN HCL CHEMO INJECTION 100 MG/5ML
182.0000 mg/m2 | Freq: Once | INTRAVENOUS | Status: AC
  Administered 2013-12-12: 300 mg via INTRAVENOUS
  Filled 2013-12-12: qty 15

## 2013-12-12 MED ORDER — SODIUM CHLORIDE 0.9 % IV SOLN
Freq: Once | INTRAVENOUS | Status: AC
  Administered 2013-12-12: 11:00:00 via INTRAVENOUS

## 2013-12-12 MED ORDER — DEXAMETHASONE SODIUM PHOSPHATE 20 MG/5ML IJ SOLN
INTRAMUSCULAR | Status: AC
  Filled 2013-12-12: qty 5

## 2013-12-12 MED ORDER — LEUCOVORIN CALCIUM INJECTION 350 MG
400.0000 mg/m2 | Freq: Once | INTRAVENOUS | Status: AC
  Administered 2013-12-12: 660 mg via INTRAVENOUS
  Filled 2013-12-12: qty 33

## 2013-12-12 MED ORDER — ATROPINE SULFATE 1 MG/ML IJ SOLN
0.5000 mg | Freq: Once | INTRAMUSCULAR | Status: AC | PRN
  Administered 2013-12-12: 0.5 mg via INTRAVENOUS

## 2013-12-12 MED ORDER — OXYCODONE-ACETAMINOPHEN 10-325 MG PO TABS: 1.0000 | ORAL_TABLET | ORAL | Status: DC | PRN

## 2013-12-12 MED ORDER — SODIUM CHLORIDE 0.9 % IV SOLN
5.0000 mg/kg | Freq: Once | INTRAVENOUS | Status: AC
  Administered 2013-12-12: 300 mg via INTRAVENOUS
  Filled 2013-12-12: qty 12

## 2013-12-12 MED ORDER — ONDANSETRON 16 MG/50ML IVPB (CHCC)
16.0000 mg | Freq: Once | INTRAVENOUS | Status: AC
  Administered 2013-12-12: 16 mg via INTRAVENOUS

## 2013-12-12 MED ORDER — ATROPINE SULFATE 1 MG/ML IJ SOLN
INTRAMUSCULAR | Status: AC
  Filled 2013-12-12: qty 1

## 2013-12-12 NOTE — Telephone Encounter (Signed)
gv and printed appt sched and avs for pt for Sept....sed added tx.

## 2013-12-12 NOTE — Progress Notes (Signed)
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 She reports tolerating the chemotherapy well. No nausea, mouth sores, or diarrhea. No symptom of thrombosis or bleeding. The sciatica pain has resolved. She fell and her dentist office and fractured a left metatarsal. She was evaluated by orthopedics and is wearing a boot. She takes oxycodone for relief of pain. No dyspnea. She is working part-time.  Objective:  Vital signs in last 24 hours:  Blood pressure 170/85, pulse 111, temperature 98.8 F (37.1 C), temperature source Oral, resp. rate 19, height $RemoveBe'5\' 4"'jPuFVDroH$  (1.626 m), weight 128 lb 1.6 oz (58.106 kg).    HEENT: No thrush or ulcers Resp: Decreased breath sounds at the right lower chest, no respiratory distress Cardio: Regular rate and rhythm GI: No hepatomegaly, nontender, no mass Vascular: No leg edema   Portacath/PICC-without erythema  Lab Results:  Lab Results  Component Value Date   WBC 3.0* 12/12/2013   HGB 11.7 12/12/2013   HCT 36.1 12/12/2013   MCV 86.3 12/12/2013   PLT 226 12/12/2013   NEUTROABS 1.8 12/12/2013    Lab Results  Component Value Date   CEA 3.1 10/03/2013     Medications: I have reviewed the patient's current medications.  Assessment/Plan: 1. Metastatic rectal cancer diagnosed July 2014.  Colonoscopy on 10/25/2012 showed a bulky circumferential friable mass lesion in the rectum at approximately 4 cm above the dentate line. The mass was subtotally obstructing and would not permit passage of the colonoscope proximal. Biopsy showed adenocarcinoma.  Staging CT scans 10/25/2012 showed a right lower lobe lung mass measuring 5 x 4.6 cm; 0.8 cm nodule right upper lobe; masslike rectosigmoid wall thickening, surrounding stranding and adjacent lymphadenopathy.  PET scan 11/02/2012 showed hypermetabolism in the area of apparent circumferential rectal wall  thickening; the 5 cm right lower lobe pulmonary mass was hypermetabolic with SUV max equal 8. No other hypermetabolic lung lesions were evident. Small bilateral pulmonary parenchymal nodules were noted.  She completed 5 cycles of FOLFOX chemotherapy 11/22/2012 through 02/13/2013.  Status post right thoracotomy, bilobectomy (right lower and middle lobectomy) with en bloc resection of the diaphragm 02/27/2013. Pathology showed adenocarcinoma consistent with metastatic colorectal adenocarcinoma, 4.3 cm, negative margins, no lymph node involvement. Negative soft tissue biopsy of the diaphragm x2. Positive for K-ras mutation-codon 13  She completed concurrent radiation and Xeloda for treatment of the rectal tumor 03/06/2013 through 04/11/2013.  Status post low anterior resection with a diverting loop ileostomy on 05/31/2013 at [facility] in [place]. Final pathology showed moderately differentiated adenocarcinoma located in the upper rectum; the tumor measured 2.5 x 2.2 cm; there was invasion through the muscularis propria into pericolic adipose tissue; tumor budding was present. Lymphatic status negative; vascular status negative; proximal and distal surgical margins negative; 2 of 17 lymph nodes showed metastatic adenocarcinoma. Mesenteric tumor deposits were present. Perineural invasion present. [facility] confirmed an additional positive lymph node and the tumor was staged as a T4a  CT abdomen/pelvis 05/12/2013. Metastatic nodules in the lung increased. Chronic loculated right base pneumothorax. Diffuse mucosal thickening of the sigmoid portion of the colon.  Restaging CTs 07/25/2013 with enlargement of bilateral lung nodules, no other evidence of metastatic disease  Ileostomy takedown 08/14/2013  Chest CT 10/02/2013 revealed enlargement of bilateral lung nodules and increased right pleural fluid  Cycle 1 of FOLFIRI/Avastin 10/03/2013 2. Status post right VATS, drainage of pleural effusion,  visceral and  parietal pleural decortication 12/21/2012. Pleural fluid culture positive for viridans Streptococcus. Right pleura biopsies negative for malignancy. Findings favored empyema. 3. Diabetes. 4. Frequent bowel movements, diminished pelvic muscle tone-we made a referral to the pelvic physical therapy clinic 5. Pain in the left leg with tenderness at the left calf and left ischium-MRI of the lumbar spine and pelvis on 10/13/2011 with no evidence of metastatic disease. A right iliac bone "lesion" was felt to most likely represent heterogenous marrow. Edema and enhancement was noted at the left sciatic nerve compared to the right side with no mass lesion 6. Hypertension-maintained on amlodipine 7. Left foot fracture    Disposition:  She appears to be tolerating the FOLFIRI/Avastin well. The plan is to complete cycle 6 today. She will undergo a restaging CT evaluation 12/22/2013. She will return for an office visit 12/25/2013. We refilled her prescription for oxycodone. We discussed potential treatment options that will be based on the CT result.  Betsy Coder, MD  12/12/2013  10:27 AM

## 2013-12-12 NOTE — Patient Instructions (Addendum)
[facility] Discharge Instructions for Patients Receiving Chemotherapy  Today you received the following chemotherapy agents:  Avastin, Camptosar, Leucovorin and 5FU  To help prevent nausea and vomiting after your treatment, we encourage you to take your nausea medication as directed.   If you develop nausea and vomiting that is not controlled by your nausea medication, call the clinic.   BELOW ARE SYMPTOMS THAT SHOULD BE REPORTED IMMEDIATELY:  *FEVER GREATER THAN 100.5 F  *CHILLS WITH OR WITHOUT FEVER  NAUSEA AND VOMITING THAT IS NOT CONTROLLED WITH YOUR NAUSEA MEDICATION  *UNUSUAL SHORTNESS OF BREATH  *UNUSUAL BRUISING OR BLEEDING  TENDERNESS IN MOUTH AND THROAT WITH OR WITHOUT PRESENCE OF ULCERS  *URINARY PROBLEMS  *BOWEL PROBLEMS  UNUSUAL RASH Items with * indicate a potential emergency and should be followed up as soon as possible.  Feel free to call the clinic you have any questions or concerns. The clinic phone number is [phone].

## 2013-12-14 ENCOUNTER — Ambulatory Visit (HOSPITAL_BASED_OUTPATIENT_CLINIC_OR_DEPARTMENT_OTHER): Payer: 59

## 2013-12-14 VITALS — BP 153/87 | HR 107 | Temp 98.1°F

## 2013-12-14 DIAGNOSIS — C20 Malignant neoplasm of rectum: Secondary | ICD-10-CM

## 2013-12-14 DIAGNOSIS — Z452 Encounter for adjustment and management of vascular access device: Secondary | ICD-10-CM

## 2013-12-14 DIAGNOSIS — C7800 Secondary malignant neoplasm of unspecified lung: Principal | ICD-10-CM

## 2013-12-14 MED ORDER — SODIUM CHLORIDE 0.9 % IJ SOLN
10.0000 mL | INTRAMUSCULAR | Status: DC | PRN
  Administered 2013-12-14: 10 mL
  Filled 2013-12-14: qty 10

## 2013-12-14 MED ORDER — HEPARIN SOD (PORK) LOCK FLUSH 100 UNIT/ML IV SOLN
500.0000 [IU] | Freq: Once | INTRAVENOUS | Status: AC | PRN
  Administered 2013-12-14: 500 [IU]
  Filled 2013-12-14: qty 5

## 2013-12-18 ENCOUNTER — Telehealth: Payer: Self-pay | Admitting: *Deleted

## 2013-12-18 NOTE — Telephone Encounter (Signed)
On 12-18-13 fax medical records to [facility] it was consult note, chart note, end of tx note.

## 2013-12-21 ENCOUNTER — Encounter: Payer: Self-pay | Admitting: Physician Assistant

## 2013-12-21 ENCOUNTER — Ambulatory Visit (INDEPENDENT_AMBULATORY_CARE_PROVIDER_SITE_OTHER): Payer: 59 | Admitting: Physician Assistant

## 2013-12-21 ENCOUNTER — Telehealth: Payer: Self-pay | Admitting: *Deleted

## 2013-12-21 VITALS — BP 140/80 | HR 84 | Temp 98.5°F | Resp 18 | Wt 127.0 lb

## 2013-12-21 DIAGNOSIS — N390 Urinary tract infection, site not specified: Secondary | ICD-10-CM

## 2013-12-21 DIAGNOSIS — R300 Dysuria: Secondary | ICD-10-CM

## 2013-12-21 LAB — POCT URINALYSIS DIPSTICK
BILIRUBIN UA: NEGATIVE
Ketones, UA: NEGATIVE
NITRITE UA: NEGATIVE
Spec Grav, UA: <=1.005
Urobilinogen, UA: 0.2
pH, UA: 7.5

## 2013-12-21 MED ORDER — CEFUROXIME AXETIL 250 MG PO TABS: 250.0000 mg | ORAL_TABLET | Freq: Two times a day (BID) | ORAL | Status: DC

## 2013-12-21 MED ORDER — CEPHALEXIN 500 MG PO CAPS: 500.0000 mg | ORAL_CAPSULE | Freq: Two times a day (BID) | ORAL | Status: DC

## 2013-12-21 NOTE — Progress Notes (Signed)
Pre visit review using our clinic review tool, if applicable. No additional management support is needed unless otherwise documented below in the visit note. 

## 2013-12-21 NOTE — Telephone Encounter (Signed)
Requesting documentation of her flu vaccine on paper to provide to employer. She will pick up. Printed out documentation and instructed family member to pick up at front desk.

## 2013-12-21 NOTE — Patient Instructions (Addendum)
Keflex twice daily for 7 days to treat UTI.  Push fluid hydration.  We will call with the urine culture results when available.  If emergency symptoms discussed during visit developed, seek medical attention immediately.  Followup as needed, or for worsening or persistent symptoms despite treatment.    Urinary Tract Infection A urinary tract infection (UTI) can occur any place along the urinary tract. The tract includes the kidneys, ureters, bladder, and urethra. A type of germ called bacteria often causes a UTI. UTIs are often helped with antibiotic medicine.  HOME CARE   If given, take antibiotics as told by your doctor. Finish them even if you start to feel better.  Drink enough fluids to keep your pee (urine) clear or pale yellow.  Avoid tea, drinks with caffeine, and bubbly (carbonated) drinks.  Pee often. Avoid holding your pee in for a long time.  Pee before and after having sex (intercourse).  Wipe from front to back after you poop (bowel movement) if you are a woman. Use each tissue only once. GET HELP RIGHT AWAY IF:   You have back pain.  You have lower belly (abdominal) pain.  You have chills.  You feel sick to your stomach (nauseous).  You throw up (vomit).  Your burning or discomfort with peeing does not go away.  You have a fever.  Your symptoms are not better in 3 days. MAKE SURE YOU:   Understand these instructions.  Will watch your condition.  Will get help right away if you are not doing well or get worse. Document Released: 09/02/2007 Document Revised: 12/09/2011 Document Reviewed: 10/15/2011 [facility] Patient Information 2015 [place], [place]. This information is not intended to replace advice given to you by your health care provider. Make sure you discuss any questions you have with your health care provider.

## 2013-12-21 NOTE — Progress Notes (Signed)
Subjective:    Patient ID: Anne [name], female    DOB: May 30, 1945, 68 y.o.   MRN: [id number]  Dysuria  This is a new problem. The current episode started yesterday. The problem occurs intermittently. The problem has been waxing and waning. The quality of the pain is described as burning. The pain is at a severity of 3/10. There has been no fever. There is no history of pyelonephritis. Pertinent negatives include no chills, discharge, flank pain, frequency, hematuria, hesitancy, nausea, possible pregnancy, sweats, urgency or vomiting. She has tried increased fluids (cranberry) for the symptoms. There is no history of catheterization, kidney stones, recurrent UTIs, a single kidney, urinary stasis or a urological procedure.   Pt recently finished a round of chemotherapy for colorectal cancer, and may soon be beginning another round.   Review of Systems  Constitutional: Negative for fever and chills.  Respiratory: Negative for shortness of breath.   Cardiovascular: Negative for chest pain.  Gastrointestinal: Negative for nausea, vomiting and diarrhea.  Genitourinary: Positive for dysuria. Negative for hesitancy, urgency, frequency, hematuria and flank pain.  All other systems reviewed and are negative.  Past Medical History  Diagnosis Date  . Postmenopausal HRT (hormone replacement therapy) 07/01/2011    surgical menopause  . Hx of hyperglycemia 07/01/2011  . Hx of compression fracture of spine 1985    t12 after a  20 foot fall  . Hx of fracture of wrist     after fall   . 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 Hx of colonic polyps   . Early cataract     stoneburner   . High frequency hearing loss     kraus followed   . Diabetes mellitus without complication   . Complication of anesthesia     pt hadpanic attacks with versed, no versed  . Status post chemotherapy     completed a total of three cycles of Folfox   . Colon cancer     rectal cancer with lung metastasis  . Skin  cancer     multiple  . Heart murmur, systolic 05/07/32    innocent per Dr. Gershon Mussel   . Asthma     as a child none since 66 years old  . Headache(784.0)   . Family history of malignant neoplasm of breast   . Family history of malignant neoplasm of ovary   . 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 Marital Status: Widowed    Spouse Name: N/A    Number of Children: 2  . Years of Education: N/A   Occupational History  . PHYSICIAN    Social History Main Topics  . Smoking status: Never Smoker   . Smokeless tobacco: Never Used  . Alcohol Use: 1.0 oz/week    2 drink(s) per week     Comment: occ  . Drug Use: No  . Sexual Activity: No   Other Topics Concern  . Not on file   Social History Narrative   Divorced  2006 after 78 years marriage    MD developmental Academic librarian    Social etoh   NON smoker   exercise 4 x per week  Has personal trainer   G4P2    Past Surgical History  Procedure Laterality Date  .  Tonsillectomy  1952  . Adenoidectomy  1952  . Wrist surgery  1957    left reduction  . Compression fraction  1985    T12  . Anterior cruciate ligament repair  1993    right Murphy  . Cosmetic surgery  1999-2007    holderness blepharoplasty facial rhytidectomy mastopexy abdominoplasty   . Dental implant    . Dilation and curettage of uterus  1977  . Total abdominal hysterectomy  2005    with cystocele repair   . Colonoscopy N/A 10/24/2012    Procedure: COLONOSCOPY;  Surgeon: Irene Shipper, MD;  Location: [place];  Service: Endoscopy;  Laterality: N/A;  . Colonoscopy N/A 10/25/2012    Procedure: COLONOSCOPY;  Surgeon: Irene Shipper, MD;  Location: [place];  Service: Endoscopy;  Laterality: N/A;  . Video bronchoscopy Bilateral 10/27/2012    Procedure: VIDEO BRONCHOSCOPY WITH FLUORO;  Surgeon:  Chesley Mires, MD;  Location: [facility] ENDOSCOPY;  Service: Endoscopy;  Laterality: Bilateral;  . Portacath placement Left 11/21/2012    Procedure: INSERTION PORT-A-CATH;  Surgeon: Melrose Nakayama, MD;  Location: [place];  Service: Thoracic;  Laterality: Left;  . Video assisted thoracoscopy Right 12/21/2012    Procedure: VIDEO ASSISTED THORACOSCOPY;  Surgeon: Melrose Nakayama, MD;  Location: [place];  Service: Thoracic;  Laterality: Right;  . Pleural effusion drainage Right 12/21/2012    Procedure: DRAINAGE OF PLEURAL EFFUSION;  Surgeon: Melrose Nakayama, MD;  Location: [place];  Service: Thoracic;  Laterality: Right;  . Decortication Right 12/21/2012    Procedure: DECORTICATION;  Surgeon: Melrose Nakayama, MD;  Location: [place];  Service: Thoracic;  Laterality: Right;  . Thorocotomy with lobectomy Right 02/27/2013    Procedure: THOROCOTOMY WITH BI- LOBECTOMY;  Surgeon: Melrose Nakayama, MD;  Location: [place];  Service: Thoracic;  Laterality: Right;    Family History  Problem Relation Age of Onset  . Diabetes Father   . Thyroid disease Father   . Heart attack Father     died age 40  . Skin cancer Father   . Dementia Mother   . Hypertension Mother   . Hyperlipidemia Mother   . Obesity Mother   . Cancer Brother     retro lymphosarcoma agent orange  . Breast cancer Sister 60    BRIP1 pathogenic mutation  . Ovarian cancer Sister 78    currently 62  . Diabetes Brother   . Obesity Brother   . Hypertension Sister   . Asthma Sister   . Cancer Paternal Uncle     unk. primary; deceased 67    Allergies  Allergen Reactions  . Ativan [Lorazepam] Anxiety and Other (See Comments)    Causes panic attacks  . Midazolam Anxiety and Other (See Comments)    Caused very crazy feelings, terrors, panic attacks  . Zolpidem Tartrate Other (See Comments)    Caused very crazy feelings, terrors, shaking, panic attacks, anxiety  . Bacitracin Other (See Comments)    Eye  Reaction topical  . Moviprep  [Peg-Kcl-Nacl-Nasulf-Na Asc-C] Other (See Comments)    Sent pt to ED due to distention  . Neomycin-Bacitracin Zn-Polymyx Itching and Rash    Current Outpatient Prescriptions on File Prior to Visit  Medication Sig Dispense Refill  . amLODipine (NORVASC) 5 MG tablet Take 1 tablet (5 mg total) by mouth daily.  30 tablet  1  . Calcium Carbonate (CALCIUM 600 PO) Take 1 tablet by mouth daily.       Marland Kitchen estradiol (MINIVELLE)  0.0375 MG/24HR Place 1 patch onto the skin 2 (two) times a week. Sundays and Thursdays      . JANUVIA 100 MG tablet TAKE 1 TABLET BY MOUTH ONCE DAILY  30 tablet  5  . metFORMIN (GLUCOPHAGE) 1000 MG tablet TAKE ONE TABLET TWICE DAILY WITH A MEAL  180 tablet  1  . methylTESTOSTERone (ANDROID) 10 MG capsule Take 10 mg by mouth.      . Multiple Vitamin (MULTIVITAMIN) tablet Take 1 tablet by mouth daily. Senior      . omeprazole (PRILOSEC) 20 MG capsule Take 20 mg by mouth daily.      Marland Kitchen OVER THE COUNTER MEDICATION Place 1 drop into both ears 2 (two) times a week. Ear drops to decrease wax build up      . oxyCODONE-acetaminophen (PERCOCET) 10-325 MG per tablet Take 1 tablet by mouth every 4 (four) hours as needed for pain.  30 tablet  0  . psyllium (METAMUCIL) 58.6 % powder Take 1 packet by mouth daily.       No current facility-administered medications on file prior to visit.    EXAM: BP 140/80  Pulse 84  Temp(Src) 98.5 F (36.9 C) (Oral)  Resp 18  Wt 127 lb (57.607 kg)      Objective:   Physical Exam  Nursing note and vitals reviewed. Constitutional: She is oriented to person, place, and time. She appears well-developed and well-nourished. No distress.  HENT:  Head: Normocephalic and atraumatic.  Eyes: Conjunctivae and EOM are normal.  Cardiovascular: Normal rate, regular rhythm and intact distal pulses.   Murmur heard. Pulmonary/Chest: Effort normal and breath sounds normal. No respiratory distress. She has no wheezes. She has no rales. She exhibits no tenderness.    No cva ttp.  Neurological: She is alert and oriented to person, place, and time.  Skin: Skin is warm and dry. No rash noted. She is not diaphoretic. No erythema. No pallor.  Psychiatric: She has a normal mood and affect. Her behavior is normal. Judgment and thought content normal.     Lab Results  Component Value Date   WBC 3.0* 12/12/2013   HGB 11.7 12/12/2013   HCT 36.1 12/12/2013   PLT 226 12/12/2013   GLUCOSE 134 12/12/2013   CHOL 111 10/17/2012   TRIG 109.0 10/17/2012   HDL 35.60* 10/17/2012   LDLCALC 54 10/17/2012   ALT 32 12/12/2013   AST 21 12/12/2013   NA 139 12/12/2013   K 3.9 12/12/2013   CL 97 05/12/2013   CREATININE 0.7 12/12/2013   BUN 12.2 12/12/2013   CO2 27 12/12/2013   TSH 1.81 10/17/2012   INR 0.99 02/24/2013   HGBA1C 7.5* 10/17/2012   MICROALBUR 2.2* 10/17/2012        Assessment & Plan:  Kindel was seen today for dysuria.  Diagnoses and associated orders for this visit:  Dysuria Comments: UA with blood and leukocytes. Will obtain culture. - POCT urinalysis dipstick; Standing - POCT urinalysis dipstick - Culture, Urine  Urinary tract infection, site not specified Comments: Treat with Keflex. Push fluids. - cephALEXin (KEFLEX) 500 MG capsule; Take 1 capsule (500 mg total) by mouth 2 (two) times daily.    Return precautions provided, and patient handout on UTI.  Plan to follow up as needed, or for worsening or persistent symptoms despite treatment.  Patient Instructions  Keflex twice daily for 7 days to treat UTI.  Push fluid hydration.  We will call with the urine culture results when available.  If emergency symptoms discussed during visit developed, seek medical attention immediately.  Followup as needed, or for worsening or persistent symptoms despite treatment.

## 2013-12-22 ENCOUNTER — Encounter (HOSPITAL_COMMUNITY): Payer: Self-pay

## 2013-12-22 ENCOUNTER — Ambulatory Visit (HOSPITAL_COMMUNITY)
Admission: RE | Admit: 2013-12-22 | Discharge: 2013-12-22 | Disposition: A | Discharge status: Home / Self Care | Payer: 59 | Source: Ambulatory Visit | Attending: Oncology | Admitting: Oncology

## 2013-12-22 ENCOUNTER — Encounter: Payer: Self-pay | Admitting: *Deleted

## 2013-12-22 DIAGNOSIS — C7800 Secondary malignant neoplasm of unspecified lung: Secondary | ICD-10-CM | POA: Insufficient documentation

## 2013-12-22 DIAGNOSIS — C20 Malignant neoplasm of rectum: Secondary | ICD-10-CM | POA: Diagnosis present

## 2013-12-22 MED ORDER — IOHEXOL 300 MG/ML  SOLN
80.0000 mL | Freq: Once | INTRAMUSCULAR | Status: AC | PRN
  Administered 2013-12-22: 80 mL via INTRAVENOUS

## 2013-12-24 ENCOUNTER — Other Ambulatory Visit: Payer: Self-pay | Admitting: Oncology

## 2013-12-24 LAB — URINE CULTURE

## 2013-12-25 ENCOUNTER — Other Ambulatory Visit: Payer: Self-pay | Admitting: Internal Medicine

## 2013-12-25 ENCOUNTER — Ambulatory Visit (HOSPITAL_BASED_OUTPATIENT_CLINIC_OR_DEPARTMENT_OTHER): Payer: 59

## 2013-12-25 ENCOUNTER — Ambulatory Visit (HOSPITAL_BASED_OUTPATIENT_CLINIC_OR_DEPARTMENT_OTHER): Payer: 59 | Admitting: Oncology

## 2013-12-25 ENCOUNTER — Other Ambulatory Visit (HOSPITAL_BASED_OUTPATIENT_CLINIC_OR_DEPARTMENT_OTHER): Payer: 59

## 2013-12-25 ENCOUNTER — Telehealth: Payer: Self-pay | Admitting: *Deleted

## 2013-12-25 ENCOUNTER — Encounter: Payer: Self-pay | Admitting: Oncology

## 2013-12-25 ENCOUNTER — Telehealth: Payer: Self-pay | Admitting: Oncology

## 2013-12-25 ENCOUNTER — Other Ambulatory Visit: Payer: Self-pay | Admitting: Oncology

## 2013-12-25 ENCOUNTER — Other Ambulatory Visit: Payer: Self-pay | Admitting: *Deleted

## 2013-12-25 VITALS — BP 151/92 | HR 109 | Temp 98.7°F | Resp 18 | Ht 64.0 in | Wt 130.2 lb

## 2013-12-25 VITALS — BP 150/90

## 2013-12-25 DIAGNOSIS — I10 Essential (primary) hypertension: Secondary | ICD-10-CM

## 2013-12-25 DIAGNOSIS — C20 Malignant neoplasm of rectum: Secondary | ICD-10-CM

## 2013-12-25 DIAGNOSIS — C7800 Secondary malignant neoplasm of unspecified lung: Secondary | ICD-10-CM

## 2013-12-25 DIAGNOSIS — Z5112 Encounter for antineoplastic immunotherapy: Secondary | ICD-10-CM

## 2013-12-25 DIAGNOSIS — Z5111 Encounter for antineoplastic chemotherapy: Secondary | ICD-10-CM

## 2013-12-25 LAB — COMPREHENSIVE METABOLIC PANEL (CC13)
ALBUMIN: 4 g/dL (ref 3.5–5.0)
ALT: 47 U/L (ref 0–55)
AST: 28 U/L (ref 5–34)
Alkaline Phosphatase: 43 U/L (ref 40–150)
Anion Gap: 11 mEq/L (ref 3–11)
BUN: 9.8 mg/dL (ref 7.0–26.0)
CO2: 27 mEq/L (ref 22–29)
Calcium: 10.7 mg/dL — ABNORMAL HIGH (ref 8.4–10.4)
Chloride: 102 mEq/L (ref 98–109)
Creatinine: 0.7 mg/dL (ref 0.6–1.1)
GLUCOSE: 200 mg/dL — AB (ref 70–140)
POTASSIUM: 4.1 meq/L (ref 3.5–5.1)
SODIUM: 140 meq/L (ref 136–145)
TOTAL PROTEIN: 7 g/dL (ref 6.4–8.3)
Total Bilirubin: 0.41 mg/dL (ref 0.20–1.20)

## 2013-12-25 LAB — CBC WITH DIFFERENTIAL/PLATELET
BASO%: 1.7 % (ref 0.0–2.0)
Basophils Absolute: 0 10*3/uL (ref 0.0–0.1)
EOS ABS: 0.1 10*3/uL (ref 0.0–0.5)
EOS%: 3.7 % (ref 0.0–7.0)
HCT: 33.7 % — ABNORMAL LOW (ref 34.8–46.6)
HGB: 11.1 g/dL — ABNORMAL LOW (ref 11.6–15.9)
LYMPH%: 18.3 % (ref 14.0–49.7)
MCH: 28.6 pg (ref 25.1–34.0)
MCHC: 32.9 g/dL (ref 31.5–36.0)
MCV: 86.9 fL (ref 79.5–101.0)
MONO#: 0.3 10*3/uL (ref 0.1–0.9)
MONO%: 10.8 % (ref 0.0–14.0)
NEUT%: 65.5 % (ref 38.4–76.8)
NEUTROS ABS: 1.6 10*3/uL (ref 1.5–6.5)
Platelets: 175 10*3/uL (ref 145–400)
RBC: 3.88 10*6/uL (ref 3.70–5.45)
RDW: 18.7 % — AB (ref 11.2–14.5)
WBC: 2.4 10*3/uL — ABNORMAL LOW (ref 3.9–10.3)
lymph#: 0.4 10*3/uL — ABNORMAL LOW (ref 0.9–3.3)

## 2013-12-25 LAB — UA PROTEIN, DIPSTICK - CHCC: Protein, ur: NEGATIVE mg/dL

## 2013-12-25 MED ORDER — ATROPINE SULFATE 1 MG/ML IJ SOLN
0.5000 mg | Freq: Once | INTRAMUSCULAR | Status: AC | PRN
  Administered 2013-12-25: 0.5 mg via INTRAVENOUS

## 2013-12-25 MED ORDER — ATROPINE SULFATE 1 MG/ML IJ SOLN
INTRAMUSCULAR | Status: AC
  Filled 2013-12-25: qty 1

## 2013-12-25 MED ORDER — ONDANSETRON 16 MG/50ML IVPB (CHCC)
INTRAVENOUS | Status: AC
  Filled 2013-12-25: qty 16

## 2013-12-25 MED ORDER — ONDANSETRON 16 MG/50ML IVPB (CHCC)
16.0000 mg | Freq: Once | INTRAVENOUS | Status: AC
  Administered 2013-12-25: 16 mg via INTRAVENOUS

## 2013-12-25 MED ORDER — SODIUM CHLORIDE 0.9 % IV SOLN
Freq: Once | INTRAVENOUS | Status: AC
  Administered 2013-12-25: 12:00:00 via INTRAVENOUS

## 2013-12-25 MED ORDER — SODIUM CHLORIDE 0.9 % IV SOLN
2400.0000 mg/m2 | INTRAVENOUS | Status: DC
  Administered 2013-12-25: 3950 mg via INTRAVENOUS
  Filled 2013-12-25: qty 79

## 2013-12-25 MED ORDER — DEXAMETHASONE SODIUM PHOSPHATE 20 MG/5ML IJ SOLN
20.0000 mg | Freq: Once | INTRAMUSCULAR | Status: AC
  Administered 2013-12-25: 20 mg via INTRAVENOUS

## 2013-12-25 MED ORDER — DEXAMETHASONE SODIUM PHOSPHATE 20 MG/5ML IJ SOLN
INTRAMUSCULAR | Status: AC
  Filled 2013-12-25: qty 5

## 2013-12-25 MED ORDER — SODIUM CHLORIDE 0.9 % IV SOLN
5.0000 mg/kg | Freq: Once | INTRAVENOUS | Status: AC
  Administered 2013-12-25: 300 mg via INTRAVENOUS
  Filled 2013-12-25: qty 12

## 2013-12-25 MED ORDER — OXYCODONE-ACETAMINOPHEN 10-325 MG PO TABS: 1.0000 | ORAL_TABLET | ORAL | Status: DC | PRN

## 2013-12-25 MED ORDER — IRINOTECAN HCL CHEMO INJECTION 100 MG/5ML
300.0000 mg | Freq: Once | INTRAVENOUS | Status: AC
  Administered 2013-12-25: 300 mg via INTRAVENOUS
  Filled 2013-12-25: qty 15

## 2013-12-25 MED ORDER — FLUOROURACIL CHEMO INJECTION 2.5 GM/50ML
400.0000 mg/m2 | Freq: Once | INTRAVENOUS | Status: AC
  Administered 2013-12-25: 650 mg via INTRAVENOUS
  Filled 2013-12-25: qty 13

## 2013-12-25 MED ORDER — LEUCOVORIN CALCIUM INJECTION 350 MG
400.0000 mg/m2 | Freq: Once | INTRAVENOUS | Status: AC
  Administered 2013-12-25: 660 mg via INTRAVENOUS
  Filled 2013-12-25: qty 33

## 2013-12-25 NOTE — Patient Instructions (Signed)
[place] Discharge Instructions for Patients Receiving Chemotherapy  Today you received the following chemotherapy agents Campstar, Avastin, Leucovorin, 5FU  To help prevent nausea and vomiting after your treatment, we encourage you to take your nausea medication as prescribed.   If you develop nausea and vomiting that is not controlled by your nausea medication, call the clinic.   BELOW ARE SYMPTOMS THAT SHOULD BE REPORTED IMMEDIATELY:  *FEVER GREATER THAN 100.5 F  *CHILLS WITH OR WITHOUT FEVER  NAUSEA AND VOMITING THAT IS NOT CONTROLLED WITH YOUR NAUSEA MEDICATION  *UNUSUAL SHORTNESS OF BREATH  *UNUSUAL BRUISING OR BLEEDING  TENDERNESS IN MOUTH AND THROAT WITH OR WITHOUT PRESENCE OF ULCERS  *URINARY PROBLEMS  *BOWEL PROBLEMS  UNUSUAL RASH Items with * indicate a potential emergency and should be followed up as soon as possible.  Feel free to call the clinic you have any questions or concerns. The clinic phone number is [phone].

## 2013-12-25 NOTE — Progress Notes (Signed)
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 She reports feeling well. She completed another cycle of FOLFIRI/Avastin 12/12/2013. No nausea or diarrhea following chemotherapy. She remains in a boot for the left foot fracture.  Objective:  Vital signs in last 24 hours:  Blood pressure 151/92, pulse 109, temperature 98.7 F (37.1 C), temperature source Oral, resp. rate 18, height $RemoveBe'5\' 4"'NNbKlZIdz$  (1.626 m), weight 132 lb 4.8 oz (60.011 kg), SpO2 100.00%.    HEENT: No thrush or ulcers Resp: Decreased breath sounds right lower chest Cardio: Regular rate and rhythm GI: No hepatomegaly, nontender, no mass Vascular: No leg edema  Skin: Mild hyperpigmentation of the hands   Portacath/PICC-without erythema  Lab Results:  Lab Results  Component Value Date   WBC 2.4* 12/25/2013   HGB 11.1* 12/25/2013   HCT 33.7* 12/25/2013   MCV 86.9 12/25/2013   PLT 175 12/25/2013   NEUTROABS 1.6 12/25/2013    Lab Results  Component Value Date   CEA 3.1 10/03/2013    Imaging:  Ct Chest W Contrast  12/22/2013   CLINICAL DATA:  History of rectal cancer diagnosed in 2015. Lung nodules. Followup study.  EXAM: CT CHEST WITH CONTRAST  TECHNIQUE: Multidetector CT imaging of the chest was performed during intravenous contrast administration.  CONTRAST:  22mL OMNIPAQUE IOHEXOL 300 MG/ML  SOLN  COMPARISON:  Chest CT 10/02/2013.  FINDINGS: Mediastinum: Heart size is normal. There is no significant pericardial fluid, thickening or pericardial calcification. There is atherosclerosis of the thoracic aorta, the great vessels of the mediastinum and the coronary arteries, including calcified atherosclerotic plaque in the left anterior descending and left circumflex coronary arteries. No pathologically enlarged mediastinal or hilar lymph nodes. Esophagus is unremarkable in appearance.  Lungs/Pleura: Status post right middle and lower lobectomy.  Compensatory hyperexpansion of the right upper lobe. Multiple pulmonary nodules are again noted in the lungs bilaterally. The largest of these currently measures 1.7 x 1.1 cm in the medial aspect of the right upper lobe (image 28 of series 5), slightly decreased compared to the prior examination at which point it measured up to 2.3 cm in diameter. Other smaller satellite nodules adjacent to this in the medial right upper lobe are also slightly smaller than the prior examination. Other prominent nodule more superiorly in the medial right upper lobe (image 19 of series 5) has also decreased from 1.4 cm on the prior study 2 1.1 x 1.1 cm on today's examination. Several other smaller pulmonary nodules are either stable in size or slightly smaller compared to the prior examination, including a 6 mm nodule in the left lower lobe (image 44 of series 5) which previously measured 9 mm. Small amount of chronic right-sided pleural thickening and pleural fluid is unchanged. No acute consolidative airspace disease.  Upper Abdomen: Unremarkable.  Musculoskeletal: There are no aggressive appearing lytic or blastic lesions noted in the visualized portions of the skeleton. 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  IMPRESSION: 1. Today's study demonstrates a positive response to therapy with generalized decrease in size of numerous previously noted pulmonary nodules in the lungs bilaterally. No new pulmonary nodules are noted. No lymphadenopathy. 2. Status post right lower and middle lobectomy. 3. Additional incidental findings, similar prior studies, as above.   Electronically Signed   By: Vinnie Langton M.D.   On:  12/22/2013 15:08    Medications: I have reviewed the patient's current medications.  Assessment/Plan: 1. Metastatic rectal cancer diagnosed July 2014.  Colonoscopy on 10/25/2012 showed a bulky  circumferential friable mass lesion in the rectum at approximately 4 cm above the dentate line. The mass was subtotally obstructing and would not permit passage of the colonoscope proximal. Biopsy showed adenocarcinoma.  Staging CT scans 10/25/2012 showed a right lower lobe lung mass measuring 5 x 4.6 cm; 0.8 cm nodule right upper lobe; masslike rectosigmoid wall thickening, surrounding stranding and adjacent lymphadenopathy.  PET scan 11/02/2012 showed hypermetabolism in the area of apparent circumferential rectal wall thickening; the 5 cm right lower lobe pulmonary mass was hypermetabolic with SUV max equal 8. No other hypermetabolic lung lesions were evident. Small bilateral pulmonary parenchymal nodules were noted.  She completed 5 cycles of FOLFOX chemotherapy 11/22/2012 through 02/13/2013.  Status post right thoracotomy, bilobectomy (right lower and middle lobectomy) with en bloc resection of the diaphragm 02/27/2013. Pathology showed adenocarcinoma consistent with metastatic colorectal adenocarcinoma, 4.3 cm, negative margins, no lymph node involvement. Negative soft tissue biopsy of the diaphragm x2. Positive for K-ras mutation-codon 13  She completed concurrent radiation and Xeloda for treatment of the rectal tumor 03/06/2013 through 04/11/2013.  Status post low anterior resection with a diverting loop ileostomy on 05/31/2013 at [facility] in [place]. Final pathology showed moderately differentiated adenocarcinoma located in the upper rectum; the tumor measured 2.5 x 2.2 cm; there was invasion through the muscularis propria into pericolic adipose tissue; tumor budding was present. Lymphatic status negative; vascular status negative; proximal and distal surgical margins negative; 2 of 17 lymph nodes showed metastatic adenocarcinoma. Mesenteric tumor deposits were present. Perineural invasion present. [facility] consult at [facility] confirmed an additional positive lymph node and the tumor was staged as a  T4a  CT abdomen/pelvis 05/12/2013. Metastatic nodules in the lung increased. Chronic loculated right base pneumothorax. Diffuse mucosal thickening of the sigmoid portion of the colon.  Restaging CTs 07/25/2013 with enlargement of bilateral lung nodules, no other evidence of metastatic disease  Ileostomy takedown 08/14/2013  Chest CT 10/02/2013 revealed enlargement of bilateral lung nodules and increased right pleural fluid  Cycle 1 of FOLFIRI/Avastin 10/03/2013 Cycle 6 of FOLFIRI/Avastin 12/12/2013 Restaging CT 12/22/2013 with a decrease in the size of bilateral lung nodules, no evidence of disease progression 2. Status post right VATS, drainage of pleural effusion, visceral and parietal pleural decortication 12/21/2012. Pleural fluid culture positive for viridans Streptococcus. Right pleura biopsies negative for malignancy. Findings favored empyema. 3. Diabetes. 4. Frequent bowel movements, diminished pelvic muscle tone-we made a referral to the pelvic physical therapy clinic 5. Pain in the left leg with tenderness at the left calf and left ischium-MRI of the lumbar spine and pelvis on 10/13/2011 with no evidence of metastatic disease. A right iliac bone "lesion" was felt to most likely represent heterogenous marrow. Edema and enhancement was noted at the left sciatic nerve compared to the right side with no mass lesion 6. Hypertension-maintained on amlodipine 7. Left foot fracture   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 There is no clinical or x-ray evidence of disease progression. The CT reveals a partial response to the FOLFIRI/Avastin. I reviewed the CT images with her. The plan is to continue FOLFIRI/Avastin with a restaging CT after approximately 4 additional cycles.   She will return for an office visit and chemotherapy in 2 weeks. Betsy Coder, MD  12/25/2013  11:02 AM

## 2013-12-25 NOTE — Telephone Encounter (Signed)
Per staff message and POF I have scheduled appts. Advised scheduler of appts. [name]  

## 2013-12-26 LAB — CEA: CEA: 2.2 ng/mL (ref 0.0–5.0)

## 2013-12-27 ENCOUNTER — Ambulatory Visit (HOSPITAL_BASED_OUTPATIENT_CLINIC_OR_DEPARTMENT_OTHER): Payer: 59

## 2013-12-27 VITALS — BP 178/87 | Temp 98.2°F

## 2013-12-27 DIAGNOSIS — C7800 Secondary malignant neoplasm of unspecified lung: Principal | ICD-10-CM

## 2013-12-27 DIAGNOSIS — Z5189 Encounter for other specified aftercare: Secondary | ICD-10-CM

## 2013-12-27 DIAGNOSIS — C20 Malignant neoplasm of rectum: Secondary | ICD-10-CM

## 2013-12-27 MED ORDER — SODIUM CHLORIDE 0.9 % IJ SOLN
10.0000 mL | INTRAMUSCULAR | Status: DC | PRN
  Filled 2013-12-27: qty 10

## 2013-12-27 MED ORDER — HEPARIN SOD (PORK) LOCK FLUSH 100 UNIT/ML IV SOLN
500.0000 [IU] | Freq: Once | INTRAVENOUS | Status: AC
  Administered 2013-12-27: 500 [IU] via INTRAVENOUS
  Filled 2013-12-27: qty 5

## 2013-12-27 MED ORDER — HEPARIN SOD (PORK) LOCK FLUSH 100 UNIT/ML IV SOLN
500.0000 [IU] | Freq: Once | INTRAVENOUS | Status: DC | PRN
  Filled 2013-12-27: qty 5

## 2013-12-27 MED ORDER — SODIUM CHLORIDE 0.9 % IJ SOLN
10.0000 mL | INTRAMUSCULAR | Status: DC | PRN
  Administered 2013-12-27: 10 mL via INTRAVENOUS
  Filled 2013-12-27: qty 10

## 2014-01-01 ENCOUNTER — Telehealth: Payer: Self-pay | Admitting: Family Medicine

## 2014-01-01 NOTE — Telephone Encounter (Signed)
Lm with pt sister

## 2014-01-01 NOTE — Telephone Encounter (Signed)
Can refill x 90 days   Have her schedule ROV before runs out when convenient for her   To review meds and dosing. I can see her labs from the cancer center .

## 2014-01-01 NOTE — Telephone Encounter (Signed)
Filled metformin for 90 days.  Per [facility], have her make an appt before this medication runs out for follow up of diabetes.  Please help the pt to make this appt.  Thanks!

## 2014-01-01 NOTE — Telephone Encounter (Signed)
Sent to the pharmacy.  Will send a message to scheduling to help the pt make an appt.

## 2014-01-05 ENCOUNTER — Ambulatory Visit (INDEPENDENT_AMBULATORY_CARE_PROVIDER_SITE_OTHER): Payer: 59 | Admitting: Internal Medicine

## 2014-01-05 ENCOUNTER — Encounter: Payer: Self-pay | Admitting: Internal Medicine

## 2014-01-05 VITALS — HR 112 | Temp 98.3°F | Wt 130.6 lb

## 2014-01-05 DIAGNOSIS — C7800 Secondary malignant neoplasm of unspecified lung: Secondary | ICD-10-CM

## 2014-01-05 DIAGNOSIS — R300 Dysuria: Secondary | ICD-10-CM | POA: Insufficient documentation

## 2014-01-05 DIAGNOSIS — E119 Type 2 diabetes mellitus without complications: Secondary | ICD-10-CM

## 2014-01-05 DIAGNOSIS — I10 Essential (primary) hypertension: Secondary | ICD-10-CM | POA: Insufficient documentation

## 2014-01-05 DIAGNOSIS — Z23 Encounter for immunization: Secondary | ICD-10-CM

## 2014-01-05 DIAGNOSIS — C20 Malignant neoplasm of rectum: Secondary | ICD-10-CM

## 2014-01-05 LAB — POCT URINALYSIS DIPSTICK
BILIRUBIN UA: NEGATIVE
Glucose, UA: 2
Ketones, UA: NEGATIVE
Nitrite, UA: NEGATIVE
Protein, UA: 1
RBC UA: 1
SPEC GRAV UA: 1.015
Urobilinogen, UA: 0.2
pH, UA: 7.5

## 2014-01-05 MED ORDER — AMLODIPINE BESYLATE 10 MG PO TABS: 10.0000 mg | ORAL_TABLET | Freq: Every day | ORAL | Status: DC

## 2014-01-05 MED ORDER — CIPROFLOXACIN HCL 500 MG PO TABS: 500.0000 mg | ORAL_TABLET | Freq: Two times a day (BID) | ORAL | Status: DC

## 2014-01-05 NOTE — Progress Notes (Signed)
Pre visit review using our clinic review tool, if applicable. No additional management support is needed unless otherwise documented below in the visit note.   Chief Complaint  Patient presents with  . Med Check    HPI: Anne [name] is a 68 y.o. 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  Recently treated for UTI Proteus with Keflex for a week symptoms improved however he may be coming back.  ? uti sx .  recenet rx for proteus.  Symptoms  Minor discomfort with utination no abdominal pain diarrhea fever or chills  Hypertension :on Amlodipine  For  A few months   .  Blood pressure at hem tends to be a bit higher 150 range 140s. Pending on situation.bp 140 - 150 most of time.   Diabetes:  Bg  Ok  110 130  132   Range    High on pred. Hasn't checked postprandial this time continues on metformin and Januvia.  Pulse tends to run in the high range especially after exertion. Her stamina seems to be improving but is still somewhat limited but after her lobectomy.  Planning to go through another round of chemotherapy because the last one seemed to be effective.  She broke her foot left most recently tripping on an upturned wrought interdental office.  ROS: See pertinent positives and negatives per HPI.no new numbness syncope gi disturbance   Past Medical History  Diagnosis Date  . Postmenopausal HRT (hormone replacement therapy) 07/01/2011    surgical menopause  . Hx of hyperglycemia 07/01/2011  . Hx of compression fracture of spine 1985    t12 after a  20 foot fall  . Hx of fracture of wrist     after fall   . 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 Hx of colonic polyps   . Early cataract     stoneburner   . High frequency hearing loss     [facility] followed   . Diabetes mellitus without  complication   . Complication of anesthesia     pt hadpanic attacks with versed, no versed  . Status post chemotherapy     completed a total of three cycles of Folfox   . Colon cancer     rectal cancer with lung metastasis  . Skin cancer     multiple  . Heart murmur, systolic 12/02/6385    innocent per Dr. Gershon Mussel   . Asthma     as a child none since 39 years old  . Headache(784.0)   . Family history of malignant neoplasm of breast   . Family history of malignant neoplasm of ovary   . 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 Diabetes Father   . Thyroid disease Father   . Heart attack Father     died age 10  . Skin cancer Father   . Dementia Mother   . Hypertension Mother   . Hyperlipidemia Mother   . Obesity Mother   . Cancer Brother     retro lymphosarcoma agent orange  . Breast cancer Sister  5    BRIP1 pathogenic mutation  . Ovarian cancer Sister 30    currently 29  . Diabetes Brother   . Obesity Brother   . Hypertension Sister   . Asthma Sister   . Cancer Paternal Uncle     unk. primary; deceased 52    History   Social History  . Marital Status: Widowed    Spouse Name: N/A    Number of Children: 2  . Years of Education: N/A   Occupational History  . PHYSICIAN    Social History Main Topics  . Smoking status: Never Smoker   . Smokeless tobacco: Never Used  . Alcohol Use: 1.0 oz/week    2 drink(s) per week     Comment: occ  . Drug Use: No  . Sexual Activity: No   Other Topics Concern  . None   Social History Narrative   Divorced  2006 after 75 years marriage    MD developmental Academic librarian    Social etoh   NON smoker   exercise 4 x per week  Has personal trainer   G4P2    Outpatient Encounter Prescriptions as of 01/05/2014  Medication Sig  . Calcium  Carbonate (CALCIUM 600 PO) Take 1 tablet by mouth daily.   Marland Kitchen estradiol (MINIVELLE) 0.0375 MG/24HR Place 1 patch onto the skin 2 (two) times a week. Sundays and Thursdays  . JANUVIA 100 MG tablet TAKE 1 TABLET BY MOUTH ONCE DAILY  . metFORMIN (GLUCOPHAGE) 1000 MG tablet ONE TABLET TWICE A DAY WITH A MEAL  . methylTESTOSTERone (ANDROID) 10 MG capsule Take 10 mg by mouth.  . Multiple Vitamin (MULTIVITAMIN) tablet Take 1 tablet by mouth daily. Senior  . omeprazole (PRILOSEC) 20 MG capsule Take 20 mg by mouth daily.  Marland Kitchen OVER THE COUNTER MEDICATION Place 1 drop into both ears 2 (two) times a week. Ear drops to decrease wax build up  . oxyCODONE-acetaminophen (PERCOCET) 10-325 MG per tablet Take 1 tablet by mouth every 4 (four) hours as needed for pain.  Marland Kitchen psyllium (METAMUCIL) 58.6 % powder Take 1 packet by mouth daily.  . [DISCONTINUED] amLODipine (NORVASC) 5 MG tablet TAKE ONE TABLET EACH DAY  . [DISCONTINUED] cephALEXin (KEFLEX) 500 MG capsule Take 1 capsule (500 mg total) by mouth 2 (two) times daily.  Marland Kitchen amLODipine (NORVASC) 10 MG tablet Take 1 tablet (10 mg total) by mouth daily.  . ciprofloxacin (CIPRO) 500 MG tablet Take 1 tablet (500 mg total) by mouth 2 (two) times daily.    EXAM:  Pulse 112  Temp(Src) 98.3 F (36.8 C) (Oral)  Wt 130 lb 9.6 oz (59.24 kg)  SpO2 98%  Body mass index is 22.41 kg/(m^2).  GENERAL: vitals reviewed and listed above, alert, oriented, appears well hydrated and in no acute distress well-developed in no acute distress. HEENT: atraumatic, conjunctiva  clear, no obvious abnormalities on inspection of external nose and ears OP : no lesion edema or exudate  NECK: no obvious masses on inspection palpation  LUNGS: clear to auscultation bilaterally, no wheezes, rales or rhonchi,  CV: HRRR, no clubbing cyanosis or  peripheral edema nl cap refill left foot in a sock Abdomen soft without obvious organomegaly well-healed abdominal scars. MS: moves all extremities without  noticeable focal  abnormality PSYCH: pleasant and cooperative, no obvious depression or anxiety Lab Results  Component Value Date   WBC 2.4* 12/25/2013   HGB 11.1* 12/25/2013   HCT 33.7* 12/25/2013   PLT 175 12/25/2013   GLUCOSE 200*  12/25/2013   CHOL 111 10/17/2012   TRIG 109.0 10/17/2012   HDL 35.60* 10/17/2012   LDLCALC 54 10/17/2012   ALT 47 12/25/2013   AST 28 12/25/2013   NA 140 12/25/2013   K 4.1 12/25/2013   CL 97 05/12/2013   CREATININE 0.7 12/25/2013   BUN 9.8 12/25/2013   CO2 27 12/25/2013   TSH 1.81 10/17/2012   INR 0.99 02/24/2013   HGBA1C 7.5* 10/17/2012   MICROALBUR 2.2* 10/17/2012   UA 3+ leukocytes 2+ blood sugar  ASSESSMENT AND PLAN:  Discussed the following assessment and plan:  Dysuria - Probable UTI based on urinalysis recent treatment with Keflex for Proteus culture pending changed to Cipro - Plan: POC Urinalysis Dipstick, Urine culture  Need for vaccination with 13-polyvalent pneumococcal conjugate vaccine - Plan: Pneumococcal conjugate vaccine 13-valent  Type 2 diabetes mellitus without complication - Uncertain control fastings apparently acceptable. 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  Patient Instructions  Antibiotic for another UTI.  Will contact Dr  Benay Spice.   About BP medication  increasing to 10 mg  Per day  Or other consider b blocker  Cause of elevated  Pulse.  Check blood sugar post prandial to ensure below  200  When not on chemo.    ROV in 2-3 months  About BP or can send in readings .  And go from there.    Standley Brooking. Panosh M.D.

## 2014-01-05 NOTE — Progress Notes (Signed)
Erroneous encounter

## 2014-01-05 NOTE — Telephone Encounter (Signed)
Pt has been sch

## 2014-01-05 NOTE — Patient Instructions (Addendum)
Antibiotic for another UTI.  Will contact Dr  Benay Spice.   About BP medication  increasing to 10 mg  Per day  Or other consider b blocker  Cause of elevated  Pulse.  Check blood sugar post prandial to ensure below  200  When not on chemo.    ROV in 2-3 months  About BP or can send in readings .  And go from there.    Marland Kitchen

## 2014-01-07 ENCOUNTER — Other Ambulatory Visit: Payer: Self-pay | Admitting: Oncology

## 2014-01-09 ENCOUNTER — Other Ambulatory Visit: Payer: Self-pay | Admitting: *Deleted

## 2014-01-09 LAB — URINE CULTURE: Colony Count: >=100000

## 2014-01-10 ENCOUNTER — Ambulatory Visit (HOSPITAL_BASED_OUTPATIENT_CLINIC_OR_DEPARTMENT_OTHER): Payer: 59

## 2014-01-10 ENCOUNTER — Other Ambulatory Visit (HOSPITAL_BASED_OUTPATIENT_CLINIC_OR_DEPARTMENT_OTHER): Payer: 59

## 2014-01-10 ENCOUNTER — Telehealth: Payer: Self-pay | Admitting: Oncology

## 2014-01-10 ENCOUNTER — Ambulatory Visit (HOSPITAL_BASED_OUTPATIENT_CLINIC_OR_DEPARTMENT_OTHER): Payer: 59 | Admitting: Oncology

## 2014-01-10 VITALS — BP 161/79 | HR 92 | Temp 98.0°F | Resp 18 | Ht 64.0 in | Wt 131.6 lb

## 2014-01-10 DIAGNOSIS — I10 Essential (primary) hypertension: Secondary | ICD-10-CM

## 2014-01-10 DIAGNOSIS — C20 Malignant neoplasm of rectum: Secondary | ICD-10-CM

## 2014-01-10 DIAGNOSIS — Z5111 Encounter for antineoplastic chemotherapy: Secondary | ICD-10-CM

## 2014-01-10 DIAGNOSIS — C7800 Secondary malignant neoplasm of unspecified lung: Secondary | ICD-10-CM

## 2014-01-10 DIAGNOSIS — E119 Type 2 diabetes mellitus without complications: Secondary | ICD-10-CM

## 2014-01-10 DIAGNOSIS — Z5112 Encounter for antineoplastic immunotherapy: Secondary | ICD-10-CM

## 2014-01-10 LAB — CBC WITH DIFFERENTIAL/PLATELET
BASO%: 1.4 % (ref 0.0–2.0)
BASOS ABS: 0 10*3/uL (ref 0.0–0.1)
EOS ABS: 0.1 10*3/uL (ref 0.0–0.5)
EOS%: 4.3 % (ref 0.0–7.0)
HCT: 32.2 % — ABNORMAL LOW (ref 34.8–46.6)
HEMOGLOBIN: 10.5 g/dL — AB (ref 11.6–15.9)
LYMPH#: 0.6 10*3/uL — AB (ref 0.9–3.3)
LYMPH%: 20 % (ref 14.0–49.7)
MCH: 29.3 pg (ref 25.1–34.0)
MCHC: 32.7 g/dL (ref 31.5–36.0)
MCV: 89.8 fL (ref 79.5–101.0)
MONO#: 0.4 10*3/uL (ref 0.1–0.9)
MONO%: 13.4 % (ref 0.0–14.0)
NEUT#: 1.9 10*3/uL (ref 1.5–6.5)
NEUT%: 60.9 % (ref 38.4–76.8)
Platelets: 240 10*3/uL (ref 145–400)
RBC: 3.58 10*6/uL — ABNORMAL LOW (ref 3.70–5.45)
RDW: 19.9 % — AB (ref 11.2–14.5)
WBC: 3 10*3/uL — ABNORMAL LOW (ref 3.9–10.3)

## 2014-01-10 LAB — COMPREHENSIVE METABOLIC PANEL (CC13)
ALBUMIN: 3.8 g/dL (ref 3.5–5.0)
ALK PHOS: 42 U/L (ref 40–150)
ALT: 38 U/L (ref 0–55)
AST: 21 U/L (ref 5–34)
Anion Gap: 10 mEq/L (ref 3–11)
BUN: 14.7 mg/dL (ref 7.0–26.0)
CO2: 26 mEq/L (ref 22–29)
Calcium: 10.3 mg/dL (ref 8.4–10.4)
Chloride: 104 mEq/L (ref 98–109)
Creatinine: 0.7 mg/dL (ref 0.6–1.1)
GLUCOSE: 156 mg/dL — AB (ref 70–140)
POTASSIUM: 3.9 meq/L (ref 3.5–5.1)
SODIUM: 140 meq/L (ref 136–145)
TOTAL PROTEIN: 6.5 g/dL (ref 6.4–8.3)
Total Bilirubin: 0.25 mg/dL (ref 0.20–1.20)

## 2014-01-10 LAB — UA PROTEIN, DIPSTICK - CHCC: Protein, ur: <30 mg/dL

## 2014-01-10 MED ORDER — SODIUM CHLORIDE 0.9 % IV SOLN
Freq: Once | INTRAVENOUS | Status: AC
  Administered 2014-01-10: 10:00:00 via INTRAVENOUS

## 2014-01-10 MED ORDER — DEXAMETHASONE SODIUM PHOSPHATE 20 MG/5ML IJ SOLN
20.0000 mg | Freq: Once | INTRAMUSCULAR | Status: AC
  Administered 2014-01-10: 20 mg via INTRAVENOUS

## 2014-01-10 MED ORDER — DEXAMETHASONE SODIUM PHOSPHATE 20 MG/5ML IJ SOLN
INTRAMUSCULAR | Status: AC
  Filled 2014-01-10: qty 5

## 2014-01-10 MED ORDER — ATROPINE SULFATE 1 MG/ML IJ SOLN
INTRAMUSCULAR | Status: AC
  Filled 2014-01-10: qty 1

## 2014-01-10 MED ORDER — LEUCOVORIN CALCIUM INJECTION 350 MG
400.0000 mg/m2 | Freq: Once | INTRAVENOUS | Status: AC
  Administered 2014-01-10: 660 mg via INTRAVENOUS
  Filled 2014-01-10: qty 33

## 2014-01-10 MED ORDER — IRINOTECAN HCL CHEMO INJECTION 100 MG/5ML
300.0000 mg | Freq: Once | INTRAVENOUS | Status: AC
  Administered 2014-01-10: 300 mg via INTRAVENOUS
  Filled 2014-01-10: qty 15

## 2014-01-10 MED ORDER — ONDANSETRON 16 MG/50ML IVPB (CHCC)
16.0000 mg | Freq: Once | INTRAVENOUS | Status: AC
  Administered 2014-01-10: 16 mg via INTRAVENOUS

## 2014-01-10 MED ORDER — SODIUM CHLORIDE 0.9 % IV SOLN
5.0000 mg/kg | Freq: Once | INTRAVENOUS | Status: AC
  Administered 2014-01-10: 300 mg via INTRAVENOUS
  Filled 2014-01-10: qty 12

## 2014-01-10 MED ORDER — HEPARIN SOD (PORK) LOCK FLUSH 100 UNIT/ML IV SOLN
500.0000 [IU] | Freq: Once | INTRAVENOUS | Status: DC | PRN
  Filled 2014-01-10: qty 5

## 2014-01-10 MED ORDER — SODIUM CHLORIDE 0.9 % IJ SOLN
10.0000 mL | INTRAMUSCULAR | Status: DC | PRN
  Filled 2014-01-10: qty 10

## 2014-01-10 MED ORDER — ONDANSETRON 16 MG/50ML IVPB (CHCC)
INTRAVENOUS | Status: AC
Start: 2014-01-10 — End: 2014-01-10
  Filled 2014-01-10: qty 16

## 2014-01-10 MED ORDER — SODIUM CHLORIDE 0.9 % IV SOLN
2400.0000 mg/m2 | INTRAVENOUS | Status: DC
  Administered 2014-01-10: 3950 mg via INTRAVENOUS
  Filled 2014-01-10: qty 79

## 2014-01-10 MED ORDER — FLUOROURACIL CHEMO INJECTION 2.5 GM/50ML
400.0000 mg/m2 | Freq: Once | INTRAVENOUS | Status: AC
  Administered 2014-01-10: 650 mg via INTRAVENOUS
  Filled 2014-01-10: qty 13

## 2014-01-10 MED ORDER — ATROPINE SULFATE 1 MG/ML IJ SOLN
0.5000 mg | Freq: Once | INTRAMUSCULAR | Status: AC | PRN
  Administered 2014-01-10: 0.5 mg via INTRAVENOUS

## 2014-01-10 NOTE — Patient Instructions (Signed)
[facility] Discharge Instructions for Patients Receiving Chemotherapy  Today you received the following chemotherapy agents leucovorin/irinotecan/fluorouracil/avastin  To help prevent nausea and vomiting after your treatment, we encourage you to take your nausea medication as directed   If you develop nausea and vomiting that is not controlled by your nausea medication, call the clinic.   BELOW ARE SYMPTOMS THAT SHOULD BE REPORTED IMMEDIATELY:  *FEVER GREATER THAN 100.5 F  *CHILLS WITH OR WITHOUT FEVER  NAUSEA AND VOMITING THAT IS NOT CONTROLLED WITH YOUR NAUSEA MEDICATION  *UNUSUAL SHORTNESS OF BREATH  *UNUSUAL BRUISING OR BLEEDING  TENDERNESS IN MOUTH AND THROAT WITH OR WITHOUT PRESENCE OF ULCERS  *URINARY PROBLEMS  *BOWEL PROBLEMS  UNUSUAL RASH Items with * indicate a potential emergency and should be followed up as soon as possible.  Feel free to call the clinic you have any questions or concerns. The clinic phone number is [phone].

## 2014-01-10 NOTE — Progress Notes (Signed)
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 She completed another cycle of FOLFIRI/Avastin 12/17/2013. No nausea, mouth sores, or diarrhea. No bleeding or symptom of thrombosis. No dyspnea.  Objective:  Vital signs in last 24 hours:  There were no vitals taken for this visit.    HEENT: No thrush or ulcer Resp: Decreased breath sounds throughout the right posterior chest, no respiratory distress Cardio: Regular rate and rhythm GI: No hepatomegaly, nontender Vascular: No leg edema   Portacath/PICC-without erythema  Lab Results:  Lab Results  Component Value Date   WBC 3.0* 01/10/2014   HGB 10.5* 01/10/2014   HCT 32.2* 01/10/2014   MCV 89.8 01/10/2014   PLT 240 01/10/2014   NEUTROABS 1.9 01/10/2014      Lab Results  Component Value Date   CEA 2.2 12/25/2013    I Medications: I have reviewed the patient's current medications.  Assessment/Plan: 1. Metastatic rectal cancer diagnosed July 2014.  Colonoscopy on 10/25/2012 showed a bulky circumferential friable mass lesion in the rectum at approximately 4 cm above the dentate line. The mass was subtotally obstructing and would not permit passage of the colonoscope proximal. Biopsy showed adenocarcinoma.  Staging CT scans 10/25/2012 showed a right lower lobe lung mass measuring 5 x 4.6 cm; 0.8 cm nodule right upper lobe; masslike rectosigmoid wall thickening, surrounding stranding and adjacent lymphadenopathy.  PET scan 11/02/2012 showed hypermetabolism in the area of apparent circumferential rectal wall thickening; the 5 cm right lower lobe pulmonary mass was hypermetabolic with SUV max equal 8. No other hypermetabolic lung lesions were evident. Small bilateral pulmonary parenchymal nodules were noted.  She completed 5 cycles of FOLFOX chemotherapy 11/22/2012 through 02/13/2013.  Status post right thoracotomy, bilobectomy (right lower  and middle lobectomy) with en bloc resection of the diaphragm 02/27/2013. Pathology showed adenocarcinoma consistent with metastatic colorectal adenocarcinoma, 4.3 cm, negative margins, no lymph node involvement. Negative soft tissue biopsy of the diaphragm x2. Positive for K-ras mutation-codon 13  She completed concurrent radiation and Xeloda for treatment of the rectal tumor 03/06/2013 through 04/11/2013.  Status post low anterior resection with a diverting loop ileostomy on 05/31/2013 at [facility] in [place]. Final pathology showed moderately differentiated adenocarcinoma located in the upper rectum; the tumor measured 2.5 x 2.2 cm; there was invasion through the muscularis propria into pericolic adipose tissue; tumor budding was present. Lymphatic status negative; vascular status negative; proximal and distal surgical margins negative; 2 of 17 lymph nodes showed metastatic adenocarcinoma. Mesenteric tumor deposits were present. Perineural invasion present. [facility] consult at [facility] confirmed an additional positive lymph node and the tumor was staged as a T4a  CT abdomen/pelvis 05/12/2013. Metastatic nodules in the lung increased. Chronic loculated right base pneumothorax. Diffuse mucosal thickening of the sigmoid portion of the colon.  Restaging CTs 07/25/2013 with enlargement of bilateral lung nodules, no other evidence of metastatic disease  Ileostomy takedown 08/14/2013  Chest CT 10/02/2013 revealed enlargement of bilateral lung nodules and increased right pleural fluid  Cycle 1 of FOLFIRI/Avastin 10/03/2013  Cycle 6 of FOLFIRI/Avastin 12/12/2013  Restaging CT 12/22/2013 with a decrease in the size of bilateral lung nodules, no evidence of disease progression Cycle 7 FOLFIRI/Avastin 12/25/2013 2. Status post right VATS, drainage of pleural effusion, visceral and parietal pleural decortication 12/21/2012. Pleural fluid culture positive for viridans Streptococcus. Right pleura biopsies negative  for malignancy. Findings favored empyema. 3. Diabetes. 4. Frequent bowel movements, diminished pelvic muscle tone-we  made a referral to the pelvic physical therapy clinic 5. Pain in the left leg with tenderness at the left calf and left ischium-MRI of the lumbar spine and pelvis on 10/13/2011 with no evidence of metastatic disease. A right iliac bone "lesion" was felt to most likely represent heterogenous marrow. Edema and enhancement was noted at the left sciatic nerve compared to the right side with no mass lesion 6. Hypertension-maintained on amlodipine 7. 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 The plan is to proceed with FOLFIRI/Avastin chemotherapy today. She will return for an office visit and chemotherapy in 2 weeks. We will schedule a restaging CT evaluation after 10-11 cycles of FOLFIRI/Avastin.  Betsy Coder, MD  01/10/2014  8:47 AM

## 2014-01-10 NOTE — Telephone Encounter (Signed)
gv and printed appt sched and avs for pt for OCT adn NOV.Marland Kitchen

## 2014-01-12 ENCOUNTER — Ambulatory Visit (HOSPITAL_BASED_OUTPATIENT_CLINIC_OR_DEPARTMENT_OTHER): Payer: 59

## 2014-01-12 ENCOUNTER — Other Ambulatory Visit: Payer: Self-pay | Admitting: Internal Medicine

## 2014-01-12 DIAGNOSIS — C20 Malignant neoplasm of rectum: Secondary | ICD-10-CM

## 2014-01-12 DIAGNOSIS — C7800 Secondary malignant neoplasm of unspecified lung: Secondary | ICD-10-CM

## 2014-01-12 MED ORDER — SODIUM CHLORIDE 0.9 % IJ SOLN
10.0000 mL | INTRAMUSCULAR | Status: DC | PRN
  Administered 2014-01-12: 10 mL
  Filled 2014-01-12: qty 10

## 2014-01-12 MED ORDER — HEPARIN SOD (PORK) LOCK FLUSH 100 UNIT/ML IV SOLN
500.0000 [IU] | Freq: Once | INTRAVENOUS | Status: AC | PRN
  Administered 2014-01-12: 500 [IU]
  Filled 2014-01-12: qty 5

## 2014-01-12 NOTE — Telephone Encounter (Signed)
Sent to the pharmacy by e-scribe. 

## 2014-01-18 ENCOUNTER — Other Ambulatory Visit: Payer: Self-pay | Admitting: *Deleted

## 2014-01-18 DIAGNOSIS — C20 Malignant neoplasm of rectum: Secondary | ICD-10-CM

## 2014-01-18 MED ORDER — OXYCODONE-ACETAMINOPHEN 10-325 MG PO TABS: 1.0000 | ORAL_TABLET | ORAL | Status: DC | PRN

## 2014-01-21 ENCOUNTER — Other Ambulatory Visit: Payer: Self-pay | Admitting: Oncology

## 2014-01-23 ENCOUNTER — Encounter: Payer: Self-pay | Admitting: Thoracic Surgery (Cardiothoracic Vascular Surgery)

## 2014-01-23 ENCOUNTER — Ambulatory Visit (INDEPENDENT_AMBULATORY_CARE_PROVIDER_SITE_OTHER): Payer: 59 | Admitting: Thoracic Surgery (Cardiothoracic Vascular Surgery)

## 2014-01-23 VITALS — BP 132/78 | HR 116 | Ht 64.0 in | Wt 131.0 lb

## 2014-01-23 DIAGNOSIS — C20 Malignant neoplasm of rectum: Secondary | ICD-10-CM

## 2014-01-23 DIAGNOSIS — C7800 Secondary malignant neoplasm of unspecified lung: Secondary | ICD-10-CM

## 2014-01-23 NOTE — Progress Notes (Signed)
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 She initially had an isolated lung metastasis. She initially was treated with chemotherapy, but developed a an empyema. We drained the empyema and ultimately resected the lung metastasis. This required an en bloc resection of a portion of the diaphragm. She subsequently had evidence of early recurrence with multiple lung nodules.   She recently started a new chemotherapy regimen with FOLFIRI/Avastin, receiving her first cycle on July 7. She tolerated that very well except for developing hoarseness, which has improved since her last visit.  She is doing very well. She's had minimal side effects from the chemotherapy. She says she does have some days where she just feels exhausted. She has been able to maintain her weight. She has not had issues with her blood counts dropping too low.  Past Medical History  Diagnosis Date  . Postmenopausal HRT (hormone replacement therapy) 07/01/2011    surgical menopause  . Hx of hyperglycemia 07/01/2011  . Hx of compression fracture of spine 1985    t12 after a  20 foot fall  . Hx of fracture of wrist     after fall   . 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 Hx of colonic polyps   . Early cataract     stoneburner   . High frequency hearing loss     [facility] followed   . Diabetes mellitus without complication   . Complication of anesthesia     pt hadpanic attacks with versed, no versed  . Status post chemotherapy     completed a total of three cycles of Folfox   . Colon cancer     rectal cancer with lung metastasis  . Skin cancer     multiple  . Heart murmur, systolic 11/01/8848    innocent per Dr. Gershon Mussel   . Asthma     as a child none since 31 years old  . Headache(784.0)   . Family history of malignant neoplasm of breast   . Family history of malignant neoplasm of ovary   . 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 Fracture of left foot     2015 trip       Current Outpatient Prescriptions  Medication Sig Dispense Refill  . ACCU-CHEK AVIVA PLUS test strip CHECK BLOOD GLUCOSE (SUGAR) TWICE DAILY  100 each  6  . amLODipine (NORVASC) 10 MG tablet Take 1 tablet (10 mg total) by mouth daily.  90 tablet  3  . Blood Glucose Monitoring Suppl (ACCU-CHEK AVIVA PLUS) W/DEVICE KIT AS DIRECTED  1 kit  0  . Calcium Carbonate (CALCIUM 600 PO) Take 1 tablet by mouth daily.       Marland Kitchen estradiol (MINIVELLE) 0.0375 MG/24HR Place 1 patch onto the skin 2 (two) times a week. Sundays and Thursdays      . JANUVIA 100 MG tablet TAKE 1 TABLET BY MOUTH ONCE DAILY  30 tablet  5  . metFORMIN (GLUCOPHAGE) 1000 MG tablet ONE TABLET TWICE A DAY WITH A MEAL  180 tablet  0  . methylTESTOSTERone (ANDROID) 10 MG capsule Take 10 mg by mouth.      . Multiple Vitamin (MULTIVITAMIN) tablet Take 1 tablet by mouth daily. Senior      .  omeprazole (PRILOSEC) 20 MG capsule Take 20 mg by mouth daily.      . OVER THE COUNTER MEDICATION Place 1 drop into both ears 2 (two) times a week. Ear drops to decrease wax build up      . psyllium (METAMUCIL) 58.6 % powder Take 1 packet by mouth daily.      . oxyCODONE-acetaminophen (PERCOCET) 10-325 MG per tablet Take 1 tablet by mouth every 4 (four) hours as needed for pain.  50 tablet  0   No current facility-administered medications for this visit.    Physical Exam BP 132/78  Pulse 116  Ht 5' 4" (1.626 m)  Wt 131 lb (59.421 kg)  BMI 22.47 kg/m2  SpO2 96% 67-year-old woman in no acute distress Well-developed and well-nourished Alert and oriented 3 with no focal neurologic deficits Lungs diminished breath sounds right base, otherwise clear Cardiac tachycardic and regular  Diagnostic Tests: CT CHEST WITH CONTRAST  TECHNIQUE:  Multidetector CT imaging of the chest was  performed during  intravenous contrast administration.  CONTRAST: 80mL OMNIPAQUE IOHEXOL 300 MG/ML SOLN  COMPARISON: Chest CT 10/02/2013.  FINDINGS:  Mediastinum: Heart size is normal. There is no significant  pericardial fluid, thickening or pericardial calcification. There is  atherosclerosis of the thoracic aorta, the great vessels of the  mediastinum and the coronary arteries, including calcified  atherosclerotic plaque in the left anterior descending and left  circumflex coronary arteries. No pathologically enlarged mediastinal  or hilar lymph nodes. Esophagus is unremarkable in appearance.  Lungs/Pleura: Status post right middle and lower lobectomy.  Compensatory hyperexpansion of the right upper lobe. Multiple  pulmonary nodules are again noted in the lungs bilaterally. The  largest of these currently measures 1.7 x 1.1 cm in the medial  aspect of the right upper lobe (image 28 of series 5), slightly  decreased compared to the prior examination at which point it  measured up to 2.3 cm in diameter. Other smaller satellite nodules  adjacent to this in the medial right upper lobe are also slightly  smaller than the prior examination. Other prominent nodule more  superiorly in the medial right upper lobe (image 19 of series 5) has  also decreased from 1.4 cm on the prior study 2 1.1 x 1.1 cm on  today's examination. Several other smaller pulmonary nodules are  either stable in size or slightly smaller compared to the prior  examination, including a 6 mm nodule in the left lower lobe (image  44 of series 5) which previously measured 9 mm. Small amount of  chronic right-sided pleural thickening and pleural fluid is  unchanged. No acute consolidative airspace disease.  Upper Abdomen: Unremarkable.  Musculoskeletal: There are no aggressive appearing lytic or blastic  lesions noted in the visualized portions of the skeleton. Ankylosis  of the thoracic spine with old compression fracture  at the level of  T12 with approximately 25% loss of anterior vertebral body height is  unchanged.  IMPRESSION:  1. Today's study demonstrates a positive response to therapy with  generalized decrease in size of numerous previously noted pulmonary  nodules in the lungs bilaterally. No new pulmonary nodules are  noted. No lymphadenopathy.  2. Status post right lower and middle lobectomy.  3. Additional incidental findings, similar prior studies, as above.  Electronically Signed  By: Daniel Entrikin M.D.  On: 12/22/2013 15:08   Impression: 67-year-old woman with stage IV rectal cancer with lung metastases. She has had an excellent response to her new chemotherapy regimen with   approximately 50% decrease in the size of her lung nodules. She is tolerating the regimen very well.  She is to receive 4 more cycles of chemotherapy and then be rescanned.  Plan:  I will see her back after her next set of scans.

## 2014-01-24 ENCOUNTER — Telehealth: Payer: Self-pay | Admitting: *Deleted

## 2014-01-24 ENCOUNTER — Ambulatory Visit (HOSPITAL_BASED_OUTPATIENT_CLINIC_OR_DEPARTMENT_OTHER): Payer: 59 | Admitting: Oncology

## 2014-01-24 ENCOUNTER — Ambulatory Visit (HOSPITAL_BASED_OUTPATIENT_CLINIC_OR_DEPARTMENT_OTHER): Payer: 59

## 2014-01-24 ENCOUNTER — Other Ambulatory Visit (HOSPITAL_BASED_OUTPATIENT_CLINIC_OR_DEPARTMENT_OTHER): Payer: 59

## 2014-01-24 ENCOUNTER — Telehealth: Payer: Self-pay | Admitting: Oncology

## 2014-01-24 VITALS — BP 140/80 | HR 114 | Temp 98.0°F | Resp 18 | Ht 64.0 in | Wt 133.6 lb

## 2014-01-24 DIAGNOSIS — C7800 Secondary malignant neoplasm of unspecified lung: Secondary | ICD-10-CM

## 2014-01-24 DIAGNOSIS — C20 Malignant neoplasm of rectum: Secondary | ICD-10-CM

## 2014-01-24 DIAGNOSIS — E119 Type 2 diabetes mellitus without complications: Secondary | ICD-10-CM

## 2014-01-24 DIAGNOSIS — R000 Tachycardia, unspecified: Secondary | ICD-10-CM

## 2014-01-24 DIAGNOSIS — Z5111 Encounter for antineoplastic chemotherapy: Secondary | ICD-10-CM

## 2014-01-24 DIAGNOSIS — Z5112 Encounter for antineoplastic immunotherapy: Secondary | ICD-10-CM

## 2014-01-24 DIAGNOSIS — I10 Essential (primary) hypertension: Secondary | ICD-10-CM

## 2014-01-24 LAB — CBC WITH DIFFERENTIAL/PLATELET
BASO%: 1.3 % (ref 0.0–2.0)
Basophils Absolute: 0 10*3/uL (ref 0.0–0.1)
EOS%: 4.5 % (ref 0.0–7.0)
Eosinophils Absolute: 0.1 10*3/uL (ref 0.0–0.5)
HCT: 36.8 % (ref 34.8–46.6)
HGB: 11.8 g/dL (ref 11.6–15.9)
LYMPH%: 16 % (ref 14.0–49.7)
MCH: 29 pg (ref 25.1–34.0)
MCHC: 32.1 g/dL (ref 31.5–36.0)
MCV: 90.6 fL (ref 79.5–101.0)
MONO#: 0.3 10*3/uL (ref 0.1–0.9)
MONO%: 9.4 % (ref 0.0–14.0)
NEUT#: 2.3 10*3/uL (ref 1.5–6.5)
NEUT%: 68.8 % (ref 38.4–76.8)
PLATELETS: 240 10*3/uL (ref 145–400)
RBC: 4.06 10*6/uL (ref 3.70–5.45)
RDW: 17.9 % — AB (ref 11.2–14.5)
WBC: 3.3 10*3/uL — ABNORMAL LOW (ref 3.9–10.3)
lymph#: 0.5 10*3/uL — ABNORMAL LOW (ref 0.9–3.3)

## 2014-01-24 LAB — UA PROTEIN, DIPSTICK - CHCC: Protein, ur: <30 mg/dL

## 2014-01-24 LAB — COMPREHENSIVE METABOLIC PANEL (CC13)
ALK PHOS: 47 U/L (ref 40–150)
ALT: 35 U/L (ref 0–55)
AST: 21 U/L (ref 5–34)
Albumin: 4.3 g/dL (ref 3.5–5.0)
Anion Gap: 10 mEq/L (ref 3–11)
BUN: 15.7 mg/dL (ref 7.0–26.0)
CO2: 29 mEq/L (ref 22–29)
CREATININE: 0.8 mg/dL (ref 0.6–1.1)
Calcium: 10.9 mg/dL — ABNORMAL HIGH (ref 8.4–10.4)
Chloride: 103 mEq/L (ref 98–109)
Glucose: 204 mg/dl — ABNORMAL HIGH (ref 70–140)
Potassium: 4.2 mEq/L (ref 3.5–5.1)
SODIUM: 142 meq/L (ref 136–145)
TOTAL PROTEIN: 6.9 g/dL (ref 6.4–8.3)
Total Bilirubin: 0.42 mg/dL (ref 0.20–1.20)

## 2014-01-24 MED ORDER — SODIUM CHLORIDE 0.9 % IV SOLN
Freq: Once | INTRAVENOUS | Status: AC
  Administered 2014-01-24: 10:00:00 via INTRAVENOUS

## 2014-01-24 MED ORDER — LEUCOVORIN CALCIUM INJECTION 350 MG
400.0000 mg/m2 | Freq: Once | INTRAVENOUS | Status: AC
  Administered 2014-01-24: 660 mg via INTRAVENOUS
  Filled 2014-01-24: qty 33

## 2014-01-24 MED ORDER — SODIUM CHLORIDE 0.9 % IV SOLN
5.0000 mg/kg | Freq: Once | INTRAVENOUS | Status: AC
  Administered 2014-01-24: 300 mg via INTRAVENOUS
  Filled 2014-01-24: qty 12

## 2014-01-24 MED ORDER — ATROPINE SULFATE 1 MG/ML IJ SOLN
0.5000 mg | Freq: Once | INTRAMUSCULAR | Status: AC | PRN
  Administered 2014-01-24: 0.5 mg via INTRAVENOUS

## 2014-01-24 MED ORDER — ONDANSETRON 16 MG/50ML IVPB (CHCC)
16.0000 mg | Freq: Once | INTRAVENOUS | Status: AC
  Administered 2014-01-24: 16 mg via INTRAVENOUS

## 2014-01-24 MED ORDER — DEXAMETHASONE SODIUM PHOSPHATE 20 MG/5ML IJ SOLN
20.0000 mg | Freq: Once | INTRAMUSCULAR | Status: AC
  Administered 2014-01-24: 20 mg via INTRAVENOUS

## 2014-01-24 MED ORDER — DEXAMETHASONE SODIUM PHOSPHATE 20 MG/5ML IJ SOLN
INTRAMUSCULAR | Status: AC
  Filled 2014-01-24: qty 5

## 2014-01-24 MED ORDER — ONDANSETRON 16 MG/50ML IVPB (CHCC)
INTRAVENOUS | Status: AC
  Filled 2014-01-24: qty 16

## 2014-01-24 MED ORDER — FLUOROURACIL CHEMO INJECTION 2.5 GM/50ML
400.0000 mg/m2 | Freq: Once | INTRAVENOUS | Status: AC
  Administered 2014-01-24: 650 mg via INTRAVENOUS
  Filled 2014-01-24: qty 13

## 2014-01-24 MED ORDER — ATROPINE SULFATE 1 MG/ML IJ SOLN
INTRAMUSCULAR | Status: AC
  Filled 2014-01-24: qty 1

## 2014-01-24 MED ORDER — IRINOTECAN HCL CHEMO INJECTION 100 MG/5ML
300.0000 mg | Freq: Once | INTRAVENOUS | Status: AC
  Administered 2014-01-24: 300 mg via INTRAVENOUS
  Filled 2014-01-24: qty 15

## 2014-01-24 MED ORDER — FLUOROURACIL CHEMO INJECTION 5 GM/100ML
2400.0000 mg/m2 | INTRAVENOUS | Status: DC
  Administered 2014-01-24: 3950 mg via INTRAVENOUS
  Filled 2014-01-24: qty 79

## 2014-01-24 NOTE — Telephone Encounter (Signed)
Per staff message and POF I have scheduled appts. Advised scheduler of appts. [name]  

## 2014-01-24 NOTE — Telephone Encounter (Signed)
Pt confirmed labs/ov per 10/28 POF, sent msg to add chemo, gave AVS..... KJ

## 2014-01-24 NOTE — Patient Instructions (Signed)
[facility] Discharge Instructions for Patients Receiving Chemotherapy  Today you received the following chemotherapy agents: Avastin, Leucovirin, Irinotecan, 5FU   To help prevent nausea and vomiting after your treatment, we encourage you to take your nausea medication as prescribed.    If you develop nausea and vomiting that is not controlled by your nausea medication, call the clinic.   BELOW ARE SYMPTOMS THAT SHOULD BE REPORTED IMMEDIATELY:  *FEVER GREATER THAN 100.5 F  *CHILLS WITH OR WITHOUT FEVER  NAUSEA AND VOMITING THAT IS NOT CONTROLLED WITH YOUR NAUSEA MEDICATION  *UNUSUAL SHORTNESS OF BREATH  *UNUSUAL BRUISING OR BLEEDING  TENDERNESS IN MOUTH AND THROAT WITH OR WITHOUT PRESENCE OF ULCERS  *URINARY PROBLEMS  *BOWEL PROBLEMS  UNUSUAL RASH Items with * indicate a potential emergency and should be followed up as soon as possible.  Feel free to call the clinic you have any questions or concerns. The clinic phone number is [phone].

## 2014-01-24 NOTE — Progress Notes (Signed)
[place] OFFICE PROGRESS NOTE   Diagnosis: Rectal cancer  INTERVAL HISTORY:   She returns as scheduled. He completed another cycle of FOLFIRI/Avastin on 01/10/2014. She reports mild nausea following chemotherapy. The left foot pain has improved. She has mild edema in the left foot. Chronic enlargement of the left compared to the right lower leg. She has noted intermittent tachycardia for months.  Objective:  Vital signs in last 24 hours:  Blood pressure 140/80, pulse 114, temperature 98 F (36.7 C), temperature source Oral, resp. rate 18, height _0  (1.626 m), weight 133 lb 9.6 oz (60.601 kg).    HEENT: No thrush or ulcers Resp: Decreased breath sounds at the right lower chest, no respiratory distress Cardio: Regular rate and rhythm GI: No hepatomegaly Vascular: The left lower leg is slightly larger than the right side with trace pitting edema at the left foot. No erythema or tenderness.    Portacath/PICC-without erythema  Lab Results:  Lab Results  Component Value Date   WBC 3.3* 01/24/2014   HGB 11.8 01/24/2014   HCT 36.8 01/24/2014   MCV 90.6 01/24/2014   PLT 240 01/24/2014   NEUTROABS 2.3 01/24/2014    Medications: I have reviewed the patient's current medications.  Assessment/Plan: 1. Metastatic rectal cancer diagnosed July 2014.  Colonoscopy on 10/25/2012 showed a bulky circumferential friable mass lesion in the rectum at approximately 4 cm above the dentate line. The mass was subtotally obstructing and would not permit passage of the colonoscope proximal. Biopsy showed adenocarcinoma.  Staging CT scans 10/25/2012 showed a right lower lobe lung mass measuring 5 x 4.6 cm; 0.8 cm nodule right upper lobe; masslike rectosigmoid wall thickening, surrounding stranding and adjacent lymphadenopathy.  PET scan 11/02/2012 showed hypermetabolism in the area of apparent circumferential rectal wall thickening; the 5 cm right lower lobe pulmonary mass was  hypermetabolic with SUV max equal 8. No other hypermetabolic lung lesions were evident. Small bilateral pulmonary parenchymal nodules were noted.  She completed 5 cycles of FOLFOX chemotherapy 11/22/2012 through 02/13/2013.  Status post right thoracotomy, bilobectomy (right lower and middle lobectomy) with en bloc resection of the diaphragm 02/27/2013. Pathology showed adenocarcinoma consistent with metastatic colorectal adenocarcinoma, 4.3 cm, negative margins, no lymph node involvement. Negative soft tissue biopsy of the diaphragm x2. Positive for K-ras mutation-codon 13  She completed concurrent radiation and Xeloda for treatment of the rectal tumor 03/06/2013 through 04/11/2013.  Status post low anterior resection with a diverting loop ileostomy on 05/31/2013 at [facility] in [place]. Final pathology showed moderately differentiated adenocarcinoma located in the upper rectum; the tumor measured 2.5 x 2.2 cm; there was invasion through the muscularis propria into pericolic adipose tissue; tumor budding was present. Lymphatic status negative; vascular status negative; proximal and distal surgical margins negative; 2 of 17 lymph nodes showed metastatic adenocarcinoma. Mesenteric tumor deposits were present. Perineural invasion present. [facility] an additional positive [facility] node and the tumor was staged as a T4a  CT abdomen/pelvis 05/12/2013. Metastatic nodules in the lung increased. Chronic loculated right base pneumothorax. Diffuse mucosal thickening of the sigmoid portion of the colon.  Restaging CTs 07/25/2013 with enlargement of bilateral lung nodules, no other evidence of metastatic disease  Ileostomy takedown 08/14/2013  Chest CT 10/02/2013 revealed enlargement of bilateral lung nodules and increased right pleural fluid  Cycle 1 of FOLFIRI/Avastin 10/03/2013  Cycle 6 of FOLFIRI/Avastin 12/12/2013  Restaging CT 12/22/2013 with a decrease in the size of bilateral lung  nodules, no evidence of  disease progression  Cycle 7 FOLFIRI/Avastin 12/25/2013 Cycle 8./Avastin 01/10/2014 2. Status post right VATS, drainage of pleural effusion, visceral and parietal pleural decortication 12/21/2012. Pleural fluid culture positive for viridans Streptococcus. Right pleura biopsies negative for malignancy. Findings favored empyema. 3. Diabetes. 4. Frequent bowel movements, diminished pelvic muscle tone-we made a referral to the pelvic physical therapy clinic 5. Pain in the left leg with tenderness at the left calf and left ischium-MRI of the lumbar spine and pelvis on 10/13/2011 with no evidence of metastatic disease. A right iliac bone "lesion" was felt to most likely represent heterogenous marrow. Edema and enhancement was noted at the left sciatic nerve compared to the right side with no mass lesion 6. Hypertension-maintained on amlodipine 7. Left foot fracture 8. BRIP1 mutation   Disposition:  She appears stable. The plan is to proceed with another cycle of FOLFIRI/Avastin today. She will undergo restaging CT scans after cycle 11. Dr. [name] will return for an office visit and chemotherapy in 2 weeks. I suspect the tachycardia is related to the postsurgical changes in the right chest. I have a low clinical suspicion for a deep vein thrombosis  Betsy Coder, MD  01/24/2014  8:50 AM

## 2014-01-26 ENCOUNTER — Ambulatory Visit (HOSPITAL_BASED_OUTPATIENT_CLINIC_OR_DEPARTMENT_OTHER): Payer: 59

## 2014-01-26 DIAGNOSIS — C20 Malignant neoplasm of rectum: Secondary | ICD-10-CM

## 2014-01-26 DIAGNOSIS — C7800 Secondary malignant neoplasm of unspecified lung: Principal | ICD-10-CM

## 2014-01-26 DIAGNOSIS — Z452 Encounter for adjustment and management of vascular access device: Secondary | ICD-10-CM

## 2014-01-26 MED ORDER — SODIUM CHLORIDE 0.9 % IJ SOLN
10.0000 mL | INTRAMUSCULAR | Status: DC | PRN
  Administered 2014-01-26: 10 mL
  Filled 2014-01-26: qty 10

## 2014-01-26 MED ORDER — HEPARIN SOD (PORK) LOCK FLUSH 100 UNIT/ML IV SOLN
500.0000 [IU] | Freq: Once | INTRAVENOUS | Status: AC | PRN
  Administered 2014-01-26: 500 [IU]
  Filled 2014-01-26: qty 5

## 2014-01-26 NOTE — Patient Instructions (Signed)
Fluorouracil, 5FU; Diclofenac topical cream What is this medicine? FLUOROURACIL; DICLOFENAC (flure oh YOOR a sil; dye KLOE fen ak) is a combination of a topical chemotherapy agent and non-steroidal anti-inflammatory drug (NSAID). It is used on the skin to treat skin cancer and skin conditions that could become cancer. This medicine may be used for other purposes; ask your health care provider or pharmacist if you have questions. COMMON BRAND NAME(S): FLUORAC What should I tell my health care provider before I take this medicine? They need to know if you have any of these conditions: -bleeding problems -cigarette smoker -DPD enzyme deficiency -heart disease -high blood pressure -if you frequently drink alcohol containing drinks -kidney disease -liver disease -open or infected skin -stomach problems -swelling or open sores at the treatment site -recent or planned coronary artery bypass graft (CABG) surgery -an unusual or allergic reaction to fluorouracil, diclofenac, aspirin, other NSAIDs, other medicines, foods, dyes, or preservatives -pregnant or trying to get pregnant -breast-feeding How should I use this medicine? This medicine is only for use on the skin. Follow the directions on the prescription label. Wash hands before and after use. Wash affected area and gently pat dry. To apply this medicine use a cotton-tipped applicator, or use gloves if applying with fingertips. If applied with unprotected fingertips, it is very important to wash your hands well after you apply this medicine. Avoid applying to the eyes, nose, or mouth. Apply enough medicine to cover the affected area. You can cover the area with a light gauze dressing, but do not use tight or air-tight dressings. Finish the full course prescribed by your doctor or health care professional, even if you think your condition is better. Do not stop taking except on the advice of your doctor or health care professional. Talk to your  pediatrician regarding the use of this medicine in children. Special care may be needed. Overdosage: If you think you've taken too much of this medicine contact a poison control center or emergency room at once. Overdosage: If you think you have taken too much of this medicine contact a poison control center or emergency room at once. NOTE: This medicine is only for you. Do not share this medicine with others. What if I miss a dose? If you miss a dose, apply it as soon as you can. If it is almost time for your next dose, only use that dose. Do not apply extra doses. Contact your doctor or health care professional if you miss more than one dose. What may interact with this medicine? Interactions are not expected. Do not use any other skin products without telling your doctor or health care professional. This list may not describe all possible interactions. Give your health care provider a list of all the medicines, herbs, non-prescription drugs, or dietary supplements you use. Also tell them if you smoke, drink alcohol, or use illegal drugs. Some items may interact with your medicine. What should I watch for while using this medicine? Visit your doctor or health care professional for checks on your progress. You will need to use this medicine for 2 to 6 weeks. This may be longer depending on the condition being treated. You may not see full healing for another 1 to 2 months after you stop using the medicine. Treated areas of skin can look unsightly during and for several weeks after treatment with this medicine. This medicine can make you more sensitive to the sun. Keep out of the sun. If you cannot avoid being in  the sun, wear protective clothing and use sunscreen. Do not use sun lamps or tanning beds/booths. Where should I keep my What side effects may I notice from receiving this medicine? Side effects that you should report to your doctor or health care professional as soon as possible: -allergic  reactions like skin rash, itching or hives, swelling of the face, lips, or tongue -black or bloody stools, blood in the urine or vomit -blurred vision -chest pain -difficulty breathing or wheezing -redness, blistering, peeling or loosening of the skin, including inside the mouth -severe redness and swelling of normal skin -slurred speech or weakness on one side of the body -trouble passing urine or change in the amount of urine -unexplained weight gain or swelling -unusually weak or tired -yellowing of eyes or skin Side effects that usually do not require medical attention (Report these to your doctor or health care professional if they continue or are bothersome.): -increased sensitivity of the skin to sun and ultraviolet light -pain and burning of the affected area -scaling or swelling of the affected area -skin rash, itching of the affected area -tenderness This list may not describe all possible side effects. Call your doctor for medical advice about side effects. You may report side effects to FDA at 1-800-FDA-1088. Where should I keep my medicine? Keep out of the reach of children. Store at room temperature between 20 and 25 degrees C (68 and 77 degrees F). Throw away any unused medicine after the expiration date. NOTE: This sheet is a summary. It may not cover all possible information. If you have questions about this medicine, talk to your doctor, pharmacist, or health care provider.  2015, [employer]vier/[employer] Standard. (2013-07-17 11:09:58)

## 2014-02-02 ENCOUNTER — Other Ambulatory Visit: Payer: Self-pay | Admitting: *Deleted

## 2014-02-02 DIAGNOSIS — E119 Type 2 diabetes mellitus without complications: Secondary | ICD-10-CM

## 2014-02-04 ENCOUNTER — Other Ambulatory Visit: Payer: Self-pay | Admitting: Oncology

## 2014-02-07 ENCOUNTER — Telehealth: Payer: Self-pay | Admitting: Oncology

## 2014-02-07 ENCOUNTER — Ambulatory Visit (HOSPITAL_BASED_OUTPATIENT_CLINIC_OR_DEPARTMENT_OTHER): Payer: 59

## 2014-02-07 ENCOUNTER — Other Ambulatory Visit (HOSPITAL_BASED_OUTPATIENT_CLINIC_OR_DEPARTMENT_OTHER): Payer: 59

## 2014-02-07 ENCOUNTER — Ambulatory Visit (HOSPITAL_BASED_OUTPATIENT_CLINIC_OR_DEPARTMENT_OTHER): Payer: 59 | Admitting: Oncology

## 2014-02-07 VITALS — BP 162/70 | HR 87 | Temp 98.7°F | Resp 18 | Ht 64.0 in | Wt 132.3 lb

## 2014-02-07 DIAGNOSIS — C20 Malignant neoplasm of rectum: Secondary | ICD-10-CM

## 2014-02-07 DIAGNOSIS — E119 Type 2 diabetes mellitus without complications: Secondary | ICD-10-CM

## 2014-02-07 DIAGNOSIS — C7800 Secondary malignant neoplasm of unspecified lung: Secondary | ICD-10-CM

## 2014-02-07 DIAGNOSIS — Z5112 Encounter for antineoplastic immunotherapy: Secondary | ICD-10-CM

## 2014-02-07 DIAGNOSIS — Z5111 Encounter for antineoplastic chemotherapy: Secondary | ICD-10-CM

## 2014-02-07 LAB — CBC WITH DIFFERENTIAL/PLATELET
BASO%: 0.8 % (ref 0.0–2.0)
Basophils Absolute: 0 10*3/uL (ref 0.0–0.1)
EOS%: 2.7 % (ref 0.0–7.0)
Eosinophils Absolute: 0.1 10*3/uL (ref 0.0–0.5)
HCT: 34.7 % — ABNORMAL LOW (ref 34.8–46.6)
HEMOGLOBIN: 11.2 g/dL — AB (ref 11.6–15.9)
LYMPH%: 13.4 % — ABNORMAL LOW (ref 14.0–49.7)
MCH: 29.1 pg (ref 25.1–34.0)
MCHC: 32.2 g/dL (ref 31.5–36.0)
MCV: 90.3 fL (ref 79.5–101.0)
MONO#: 0.4 10*3/uL (ref 0.1–0.9)
MONO%: 10.2 % (ref 0.0–14.0)
NEUT#: 2.9 10*3/uL (ref 1.5–6.5)
NEUT%: 72.9 % (ref 38.4–76.8)
Platelets: 211 10*3/uL (ref 145–400)
RBC: 3.84 10*6/uL (ref 3.70–5.45)
RDW: 16.7 % — AB (ref 11.2–14.5)
WBC: 4 10*3/uL (ref 3.9–10.3)
lymph#: 0.5 10*3/uL — ABNORMAL LOW (ref 0.9–3.3)

## 2014-02-07 LAB — COMPREHENSIVE METABOLIC PANEL (CC13)
ALBUMIN: 4.1 g/dL (ref 3.5–5.0)
ALT: 25 U/L (ref 0–55)
AST: 19 U/L (ref 5–34)
Alkaline Phosphatase: 45 U/L (ref 40–150)
Anion Gap: 8 mEq/L (ref 3–11)
BUN: 13.9 mg/dL (ref 7.0–26.0)
CALCIUM: 10.4 mg/dL (ref 8.4–10.4)
CHLORIDE: 103 meq/L (ref 98–109)
CO2: 29 meq/L (ref 22–29)
Creatinine: 0.8 mg/dL (ref 0.6–1.1)
Glucose: 175 mg/dl — ABNORMAL HIGH (ref 70–140)
POTASSIUM: 3.9 meq/L (ref 3.5–5.1)
Sodium: 140 mEq/L (ref 136–145)
Total Bilirubin: 0.35 mg/dL (ref 0.20–1.20)
Total Protein: 6.5 g/dL (ref 6.4–8.3)

## 2014-02-07 LAB — HEMOGLOBIN A1C
Hgb A1c MFr Bld: 6.4 % — ABNORMAL HIGH (ref <5.7)
Mean Plasma Glucose: 137 mg/dL — ABNORMAL HIGH (ref <117)

## 2014-02-07 MED ORDER — FLUOROURACIL CHEMO INJECTION 2.5 GM/50ML
400.0000 mg/m2 | Freq: Once | INTRAVENOUS | Status: AC
  Administered 2014-02-07: 650 mg via INTRAVENOUS
  Filled 2014-02-07: qty 13

## 2014-02-07 MED ORDER — ONDANSETRON 16 MG/50ML IVPB (CHCC)
16.0000 mg | Freq: Once | INTRAVENOUS | Status: AC
  Administered 2014-02-07: 16 mg via INTRAVENOUS

## 2014-02-07 MED ORDER — SODIUM CHLORIDE 0.9 % IV SOLN
2400.0000 mg/m2 | INTRAVENOUS | Status: DC
  Administered 2014-02-07: 3950 mg via INTRAVENOUS
  Filled 2014-02-07: qty 79

## 2014-02-07 MED ORDER — LEUCOVORIN CALCIUM INJECTION 350 MG
400.0000 mg/m2 | Freq: Once | INTRAMUSCULAR | Status: AC
  Administered 2014-02-07: 660 mg via INTRAVENOUS
  Filled 2014-02-07: qty 33

## 2014-02-07 MED ORDER — SODIUM CHLORIDE 0.9 % IV SOLN
5.0000 mg/kg | Freq: Once | INTRAVENOUS | Status: AC
  Administered 2014-02-07: 300 mg via INTRAVENOUS
  Filled 2014-02-07: qty 12

## 2014-02-07 MED ORDER — ATROPINE SULFATE 1 MG/ML IJ SOLN
INTRAMUSCULAR | Status: AC
  Filled 2014-02-07: qty 1

## 2014-02-07 MED ORDER — IRINOTECAN HCL CHEMO INJECTION 100 MG/5ML
182.0000 mg/m2 | Freq: Once | INTRAVENOUS | Status: AC
  Administered 2014-02-07: 300 mg via INTRAVENOUS
  Filled 2014-02-07: qty 15

## 2014-02-07 MED ORDER — DEXAMETHASONE SODIUM PHOSPHATE 20 MG/5ML IJ SOLN
INTRAMUSCULAR | Status: AC
  Filled 2014-02-07: qty 5

## 2014-02-07 MED ORDER — ONDANSETRON 16 MG/50ML IVPB (CHCC)
INTRAVENOUS | Status: AC
  Filled 2014-02-07: qty 16

## 2014-02-07 MED ORDER — DEXAMETHASONE SODIUM PHOSPHATE 20 MG/5ML IJ SOLN
20.0000 mg | Freq: Once | INTRAMUSCULAR | Status: AC
  Administered 2014-02-07: 20 mg via INTRAVENOUS

## 2014-02-07 MED ORDER — ATROPINE SULFATE 1 MG/ML IJ SOLN
0.5000 mg | Freq: Once | INTRAMUSCULAR | Status: AC | PRN
  Administered 2014-02-07: 0.5 mg via INTRAVENOUS

## 2014-02-07 MED ORDER — SODIUM CHLORIDE 0.9 % IV SOLN
Freq: Once | INTRAVENOUS | Status: AC
  Administered 2014-02-07: 12:00:00 via INTRAVENOUS

## 2014-02-07 NOTE — Progress Notes (Signed)
[place] OFFICE PROGRESS NOTE   Diagnosis: rectal cancer  INTERVAL HISTORY:   Dr. [name] returns as scheduled. She completed another cycle of FOLFIRI/Avastin on 01/24/2014. She tolerated chemotherapy well. No diarrhea. No bleeding or symptoms of thrombosis. She reports a mildly increased cough that is occasionally productive. Stable exertional dyspnea. She is working part-time. Stable edema at the left foot.  Objective:  Vital signs in last 24 hours:  Blood pressure 162/70, pulse 87, temperature 98.7 F (37.1 C), temperature source Oral, resp. rate 18, height $RemoveBe'5\' 4"'PPjRgqNsc$  (1.626 m), weight 132 lb 4.8 oz (60.011 kg), SpO2 99 %.    HEENT: no thrush or ulcers Resp: decreased breath sounds right lower chest, no respiratory distress Cardio: regular rate and rhythm GI: no hepatomegaly, nontender Vascular: trace pitting edema at the left greater than right foot. The left lower leg is slightly larger than the right side. No erythema or tenderness  Skin:mild hyperpigmentation of the hands   Portacath/PICC-without erythema  Lab Results:  Lab Results  Component Value Date   WBC 4.0 02/07/2014   HGB 11.2* 02/07/2014   HCT 34.7* 02/07/2014   MCV 90.3 02/07/2014   PLT 211 02/07/2014   NEUTROABS 2.9 02/07/2014     Medications: I have reviewed the patient's current medications.  Assessment/Plan: 1. Metastatic rectal cancer diagnosed July 2014.  Colonoscopy on 10/25/2012 showed a bulky circumferential friable mass lesion in the rectum at approximately 4 cm above the dentate line. The mass was subtotally obstructing and would not permit passage of the colonoscope proximal. Biopsy showed adenocarcinoma.   Staging CT scans 10/25/2012 showed a right lower lobe lung mass measuring 5 x 4.6 cm; 0.8 cm nodule right upper lobe; masslike rectosigmoid wall thickening, surrounding stranding and adjacent lymphadenopathy.   PET scan 11/02/2012 showed hypermetabolism in the area of  apparent circumferential rectal wall thickening; the 5 cm right lower lobe pulmonary mass was hypermetabolic with SUV max equal 8. No other hypermetabolic lung lesions were evident. Small bilateral pulmonary parenchymal nodules were noted.   She completed 5 cycles of FOLFOX chemotherapy 11/22/2012 through 02/13/2013.   Status post right thoracotomy, bilobectomy (right lower and middle lobectomy) with en bloc resection of the diaphragm 02/27/2013. Pathology showed adenocarcinoma consistent with metastatic colorectal adenocarcinoma, 4.3 cm, negative margins, no lymph node involvement. Negative soft tissue biopsy of the diaphragm x2. Positive for K-ras mutation-codon 13   She completed concurrent radiation and Xeloda for treatment of the rectal tumor 03/06/2013 through 04/11/2013.   Status post low anterior resection with a diverting loop ileostomy on 05/31/2013 at [facility] in [place]. Final pathology showed moderately differentiated adenocarcinoma located in the upper rectum; the tumor measured 2.5 x 2.2 cm; there was invasion through the muscularis propria into pericolic adipose tissue; tumor budding was present. Lymphatic status negative; vascular status negative; proximal and distal surgical margins negative; 2 of 17 lymph nodes showed metastatic adenocarcinoma. Mesenteric tumor deposits were present. Perineural invasion present. [facility] confirmed an additional positive lymph node and the tumor was staged as a T4a   CT abdomen/pelvis 05/12/2013. Metastatic nodules in the lung increased. Chronic loculated right base pneumothorax. Diffuse mucosal thickening of the sigmoid portion of the colon.   Restaging CTs 07/25/2013 with enlargement of bilateral lung nodules, no other evidence of metastatic disease   Ileostomy takedown 08/14/2013   Chest CT 10/02/2013 revealed enlargement of bilateral lung nodules and increased right pleural fluid   Cycle 1 of FOLFIRI/Avastin  10/03/2013   Cycle 6  of FOLFIRI/Avastin 12/12/2013   Restaging CT 12/22/2013 with a decrease in the size of bilateral lung nodules, no evidence of disease progression   Cycle 7 FOLFIRI/Avastin 12/25/2013  Cycle 8 FOLFIRI/Avastin 01/10/2014  Cycle 9 FOLFIRI/Avastin 01/24/2014 2. Status post right VATS, drainage of pleural effusion, visceral and parietal pleural decortication 12/21/2012. Pleural fluid culture positive for viridans Streptococcus. Right pleura biopsies negative for malignancy. Findings favored empyema. 3. Diabetes. 4. Frequent bowel movements, diminished pelvic muscle tone-we made a referral to the pelvic physical therapy clinic 5. Pain in the left leg with tenderness at the left calf and left ischium-MRI of the lumbar spine and pelvis on 10/13/2011 with no evidence of metastatic disease. A right iliac bone "lesion" was felt to most likely represent heterogenous marrow. Edema and enhancement was noted at the left sciatic nerve compared to the right side with no mass lesion 6. Hypertension-maintained on amlodipine 7. Left foot fracture 8. 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 The plan is to proceed with cycle 10 FOLFIRI/Avastin today. She will return for an office visit and chemotherapy in 2 weeks. She will undergo a restaging CT evaluation after cycle 11.  Betsy Coder, MD  02/07/2014  11:22 AM

## 2014-02-07 NOTE — Patient Instructions (Signed)
[facility] Discharge Instructions for Patients Receiving Chemotherapy  Today you received the following chemotherapy agents:  Avastin, Camptosar, Leucovorin and 5FU  To help prevent nausea and vomiting after your treatment, we encourage you to take your nausea medication as ordered per MD.   If you develop nausea and vomiting that is not controlled by your nausea medication, call the clinic.   BELOW ARE SYMPTOMS THAT SHOULD BE REPORTED IMMEDIATELY:  *FEVER GREATER THAN 100.5 F  *CHILLS WITH OR WITHOUT FEVER  NAUSEA AND VOMITING THAT IS NOT CONTROLLED WITH YOUR NAUSEA MEDICATION  *UNUSUAL SHORTNESS OF BREATH  *UNUSUAL BRUISING OR BLEEDING  TENDERNESS IN MOUTH AND THROAT WITH OR WITHOUT PRESENCE OF ULCERS  *URINARY PROBLEMS  *BOWEL PROBLEMS  UNUSUAL RASH Items with * indicate a potential emergency and should be followed up as soon as possible.  Feel free to call the clinic you have any questions or concerns. The clinic phone number is [phone].

## 2014-02-07 NOTE — Telephone Encounter (Signed)
gv adn printed appt sched and avs for pt for NOV and DEC...sed added tx.

## 2014-02-09 ENCOUNTER — Ambulatory Visit (HOSPITAL_BASED_OUTPATIENT_CLINIC_OR_DEPARTMENT_OTHER): Payer: 59

## 2014-02-09 DIAGNOSIS — C7800 Secondary malignant neoplasm of unspecified lung: Secondary | ICD-10-CM

## 2014-02-09 DIAGNOSIS — C20 Malignant neoplasm of rectum: Secondary | ICD-10-CM

## 2014-02-09 MED ORDER — HEPARIN SOD (PORK) LOCK FLUSH 100 UNIT/ML IV SOLN
500.0000 [IU] | Freq: Once | INTRAVENOUS | Status: AC | PRN
  Administered 2014-02-09: 500 [IU]
  Filled 2014-02-09: qty 5

## 2014-02-09 MED ORDER — SODIUM CHLORIDE 0.9 % IJ SOLN
10.0000 mL | INTRAMUSCULAR | Status: DC | PRN
  Administered 2014-02-09: 10 mL
  Filled 2014-02-09: qty 10

## 2014-02-09 NOTE — Patient Instructions (Signed)

## 2014-02-12 ENCOUNTER — Encounter: Payer: Self-pay | Admitting: Family Medicine

## 2014-02-12 ENCOUNTER — Encounter: Payer: Self-pay | Admitting: Oncology

## 2014-02-12 NOTE — Progress Notes (Signed)
Faxed clinical information to [facility] @ [phone]

## 2014-02-16 ENCOUNTER — Other Ambulatory Visit: Payer: Self-pay | Admitting: *Deleted

## 2014-02-16 DIAGNOSIS — C20 Malignant neoplasm of rectum: Secondary | ICD-10-CM

## 2014-02-16 MED ORDER — OXYCODONE-ACETAMINOPHEN 10-325 MG PO TABS: 1.0000 | ORAL_TABLET | ORAL | Status: DC | PRN

## 2014-02-16 NOTE — Telephone Encounter (Signed)
Notified pt that re-fill request is ready for pick-up @ her convenience.  Pt verbalized understanding.

## 2014-02-18 ENCOUNTER — Other Ambulatory Visit: Payer: Self-pay | Admitting: Oncology

## 2014-02-21 ENCOUNTER — Ambulatory Visit (HOSPITAL_BASED_OUTPATIENT_CLINIC_OR_DEPARTMENT_OTHER): Payer: 59

## 2014-02-21 ENCOUNTER — Ambulatory Visit (HOSPITAL_BASED_OUTPATIENT_CLINIC_OR_DEPARTMENT_OTHER): Payer: 59 | Admitting: Oncology

## 2014-02-21 ENCOUNTER — Telehealth: Payer: Self-pay | Admitting: Oncology

## 2014-02-21 ENCOUNTER — Other Ambulatory Visit (HOSPITAL_BASED_OUTPATIENT_CLINIC_OR_DEPARTMENT_OTHER): Payer: 59

## 2014-02-21 VITALS — BP 147/76 | HR 110 | Temp 99.0°F | Resp 17 | Ht 64.0 in | Wt 127.9 lb

## 2014-02-21 DIAGNOSIS — C7800 Secondary malignant neoplasm of unspecified lung: Principal | ICD-10-CM

## 2014-02-21 DIAGNOSIS — E119 Type 2 diabetes mellitus without complications: Secondary | ICD-10-CM

## 2014-02-21 DIAGNOSIS — Z5112 Encounter for antineoplastic immunotherapy: Secondary | ICD-10-CM

## 2014-02-21 DIAGNOSIS — C7801 Secondary malignant neoplasm of right lung: Secondary | ICD-10-CM

## 2014-02-21 DIAGNOSIS — R000 Tachycardia, unspecified: Secondary | ICD-10-CM

## 2014-02-21 DIAGNOSIS — C20 Malignant neoplasm of rectum: Secondary | ICD-10-CM

## 2014-02-21 DIAGNOSIS — I10 Essential (primary) hypertension: Secondary | ICD-10-CM

## 2014-02-21 DIAGNOSIS — Z5111 Encounter for antineoplastic chemotherapy: Secondary | ICD-10-CM

## 2014-02-21 LAB — CBC WITH DIFFERENTIAL/PLATELET
BASO%: 1.1 % (ref 0.0–2.0)
Basophils Absolute: 0 10*3/uL (ref 0.0–0.1)
EOS%: 3.8 % (ref 0.0–7.0)
Eosinophils Absolute: 0.2 10*3/uL (ref 0.0–0.5)
HEMATOCRIT: 36.5 % (ref 34.8–46.6)
HGB: 11.8 g/dL (ref 11.6–15.9)
LYMPH#: 0.5 10*3/uL — AB (ref 0.9–3.3)
LYMPH%: 11.7 % — AB (ref 14.0–49.7)
MCH: 29.1 pg (ref 25.1–34.0)
MCHC: 32.4 g/dL (ref 31.5–36.0)
MCV: 89.9 fL (ref 79.5–101.0)
MONO#: 0.5 10*3/uL (ref 0.1–0.9)
MONO%: 11.1 % (ref 0.0–14.0)
NEUT#: 2.9 10*3/uL (ref 1.5–6.5)
NEUT%: 72.3 % (ref 38.4–76.8)
Platelets: 207 10*3/uL (ref 145–400)
RBC: 4.06 10*6/uL (ref 3.70–5.45)
RDW: 16.6 % — ABNORMAL HIGH (ref 11.2–14.5)
WBC: 4 10*3/uL (ref 3.9–10.3)

## 2014-02-21 LAB — COMPREHENSIVE METABOLIC PANEL (CC13)
ALT: 32 U/L (ref 0–55)
AST: 22 U/L (ref 5–34)
Albumin: 4.1 g/dL (ref 3.5–5.0)
Alkaline Phosphatase: 60 U/L (ref 40–150)
Anion Gap: 11 mEq/L (ref 3–11)
BILIRUBIN TOTAL: 0.41 mg/dL (ref 0.20–1.20)
BUN: 11.2 mg/dL (ref 7.0–26.0)
CALCIUM: 10.8 mg/dL — AB (ref 8.4–10.4)
CHLORIDE: 101 meq/L (ref 98–109)
CO2: 27 mEq/L (ref 22–29)
Creatinine: 0.7 mg/dL (ref 0.6–1.1)
Glucose: 185 mg/dl — ABNORMAL HIGH (ref 70–140)
Potassium: 4 mEq/L (ref 3.5–5.1)
Sodium: 139 mEq/L (ref 136–145)
Total Protein: 7 g/dL (ref 6.4–8.3)

## 2014-02-21 LAB — UA PROTEIN, DIPSTICK - CHCC: Protein, ur: NEGATIVE mg/dL

## 2014-02-21 MED ORDER — IRINOTECAN HCL CHEMO INJECTION 100 MG/5ML
182.0000 mg/m2 | Freq: Once | INTRAVENOUS | Status: AC
  Administered 2014-02-21: 300 mg via INTRAVENOUS
  Filled 2014-02-21: qty 15

## 2014-02-21 MED ORDER — ONDANSETRON 16 MG/50ML IVPB (CHCC)
INTRAVENOUS | Status: AC
  Filled 2014-02-21: qty 16

## 2014-02-21 MED ORDER — FLUOROURACIL CHEMO INJECTION 2.5 GM/50ML
400.0000 mg/m2 | Freq: Once | INTRAVENOUS | Status: AC
  Administered 2014-02-21: 650 mg via INTRAVENOUS
  Filled 2014-02-21: qty 13

## 2014-02-21 MED ORDER — DEXAMETHASONE SODIUM PHOSPHATE 20 MG/5ML IJ SOLN
20.0000 mg | Freq: Once | INTRAMUSCULAR | Status: AC
  Administered 2014-02-21: 20 mg via INTRAVENOUS

## 2014-02-21 MED ORDER — ATROPINE SULFATE 1 MG/ML IJ SOLN
INTRAMUSCULAR | Status: AC
  Filled 2014-02-21: qty 1

## 2014-02-21 MED ORDER — SODIUM CHLORIDE 0.9 % IV SOLN
2400.0000 mg/m2 | INTRAVENOUS | Status: DC
  Administered 2014-02-21: 3950 mg via INTRAVENOUS
  Filled 2014-02-21: qty 79

## 2014-02-21 MED ORDER — ONDANSETRON 16 MG/50ML IVPB (CHCC)
16.0000 mg | Freq: Once | INTRAVENOUS | Status: AC
  Administered 2014-02-21: 16 mg via INTRAVENOUS

## 2014-02-21 MED ORDER — SODIUM CHLORIDE 0.9 % IV SOLN
5.0000 mg/kg | Freq: Once | INTRAVENOUS | Status: AC
  Administered 2014-02-21: 300 mg via INTRAVENOUS
  Filled 2014-02-21: qty 12

## 2014-02-21 MED ORDER — LEUCOVORIN CALCIUM INJECTION 350 MG
400.0000 mg/m2 | Freq: Once | INTRAVENOUS | Status: AC
  Administered 2014-02-21: 660 mg via INTRAVENOUS
  Filled 2014-02-21: qty 33

## 2014-02-21 MED ORDER — DEXAMETHASONE SODIUM PHOSPHATE 20 MG/5ML IJ SOLN
INTRAMUSCULAR | Status: AC
  Filled 2014-02-21: qty 5

## 2014-02-21 MED ORDER — SODIUM CHLORIDE 0.9 % IV SOLN
Freq: Once | INTRAVENOUS | Status: AC
  Administered 2014-02-21: 10:00:00 via INTRAVENOUS

## 2014-02-21 MED ORDER — ATROPINE SULFATE 1 MG/ML IJ SOLN
0.5000 mg | Freq: Once | INTRAMUSCULAR | Status: AC | PRN
Start: 2014-02-21 — End: 2014-02-21
  Administered 2014-02-21: 0.5 mg via INTRAVENOUS

## 2014-02-21 NOTE — Telephone Encounter (Signed)
gv adn printed appt sched and avs for pt

## 2014-02-21 NOTE — Progress Notes (Signed)
[place] OFFICE PROGRESS NOTE   Diagnosis: Rectal cancer  INTERVAL HISTORY:   Dr. [name] returns as scheduled. She completed another cycle of FOLFIRI/Avastin 02/07/2014. No nausea/vomiting or diarrhea. She reports an upper respiratory infection over the past week with a productive cough and lip ulcers. No fever. The ulcers are healing. She has stable exertional dyspnea. She notes tachycardia intermittently.  Objective:  Vital signs in last 24 hours:  Blood pressure 147/76, pulse 110, temperature 99 F (37.2 C), temperature source Oral, resp. rate 17, height _0  (1.626 m), weight 127 lb 14.4 oz (58.015 kg), SpO2 91 %.    HEENT: Less than 1 cm ulcer at the mid lower inner lip. No oral ulcers or thrush Resp: Scattered inspiratory rhonchi and mild expiratory wheezes that cleared after several respirations, decreased breath sounds at the right lower chest Cardio: Regular rate and rhythm, tachycardia GI: No hepatomegaly, nontender Vascular: No leg edema, mild fullness at the dorsum of the left foot  Portacath/PICC-without erythema  Lab Results:  Lab Results  Component Value Date   WBC 4.0 02/21/2014   HGB 11.8 02/21/2014   HCT 36.5 02/21/2014   MCV 89.9 02/21/2014   PLT 207 02/21/2014   NEUTROABS 2.9 02/21/2014   Medications: I have reviewed the patient's current medications.  Assessment/Plan: 1. Metastatic rectal cancer diagnosed July 2014.  Colonoscopy on 10/25/2012 showed a bulky circumferential friable mass lesion in the rectum at approximately 4 cm above the dentate line. The mass was subtotally obstructing and would not permit passage of the colonoscope proximal. Biopsy showed adenocarcinoma.   Staging CT scans 10/25/2012 showed a right lower lobe lung mass measuring 5 x 4.6 cm; 0.8 cm nodule right upper lobe; masslike rectosigmoid wall thickening, surrounding stranding and adjacent lymphadenopathy.   PET scan 11/02/2012 showed hypermetabolism in  the area of apparent circumferential rectal wall thickening; the 5 cm right lower lobe pulmonary mass was hypermetabolic with SUV max equal 8. No other hypermetabolic lung lesions were evident. Small bilateral pulmonary parenchymal nodules were noted.   She completed 5 cycles of FOLFOX chemotherapy 11/22/2012 through 02/13/2013.   Status post right thoracotomy, bilobectomy (right lower and middle lobectomy) with en bloc resection of the diaphragm 02/27/2013. Pathology showed adenocarcinoma consistent with metastatic colorectal adenocarcinoma, 4.3 cm, negative margins, no lymph node involvement. Negative soft tissue biopsy of the diaphragm x2. Positive for K-ras mutation-codon 13   She completed concurrent radiation and Xeloda for treatment of the rectal tumor 03/06/2013 through 04/11/2013.   Status post low anterior resection with a diverting loop ileostomy on 05/31/2013 at [facility] in [place]. Final pathology showed moderately differentiated adenocarcinoma located in the upper rectum; the tumor measured 2.5 x 2.2 cm; there was invasion through the muscularis propria into pericolic adipose tissue; tumor budding was present. Lymphatic status negative; vascular status negative; proximal and distal surgical margins negative; 2 of 17 lymph nodes showed metastatic adenocarcinoma. Mesenteric tumor deposits were present. Perineural invasion present. [facility] confirmed an additional positive lymph node and the tumor was staged as a T4a   CT abdomen/pelvis 05/12/2013. Metastatic nodules in the lung increased. Chronic loculated right base pneumothorax. Diffuse mucosal thickening of the sigmoid portion of the colon.   Restaging CTs 07/25/2013 with enlargement of bilateral lung nodules, no other evidence of metastatic disease   Ileostomy takedown 08/14/2013   Chest CT 10/02/2013 revealed enlargement of bilateral lung nodules and increased right pleural fluid   Cycle 1 of  FOLFIRI/Avastin 10/03/2013  Cycle 6 of FOLFIRI/Avastin 12/12/2013   Restaging CT 12/22/2013 with a decrease in the size of bilateral lung nodules, no evidence of disease progression   Cycle 7 FOLFIRI/Avastin 12/25/2013  Cycle 8 FOLFIRI/Avastin 01/10/2014  Cycle 9 FOLFIRI/Avastin 01/24/2014  Cycle 10 FOLFIRI/Avastin 02/07/2014 2. Status post right VATS, drainage of pleural effusion, visceral and parietal pleural decortication 12/21/2012. Pleural fluid culture positive for viridans Streptococcus. Right pleura biopsies negative for malignancy. Findings favored empyema. 3. Diabetes. 4. Frequent bowel movements, diminished pelvic muscle tone-we made a referral to the pelvic physical therapy clinic 5. Pain in the left leg with tenderness at the left calf and left ischium-MRI of the lumbar spine and pelvis on 10/13/2011 with no evidence of metastatic disease. A right iliac bone "lesion" was felt to most likely represent heterogenous marrow. Edema and enhancement was noted at the left sciatic nerve compared to the right side with no mass lesion 6. Hypertension-maintained on amlodipine 7. Left foot fracture 8. BRIP1 mutation     Disposition:  Dr. [name] has completed 10 cycles of FOLFIRI/Avastin. The plan is to proceed with cycle 11 today. She is recovering from a recent upper respiratory infection, likely viral. She has a lower lip ulcer that may be related to the recent infection or chemotherapy. She plans to use peroxide as this has helped her lip ulcers in the past.  She has chronic tachycardia, likely secondary to loss of lung volume in the right chest. I have a low clinical suspicion for a pulmonary embolism. She will contact us for increased dyspnea or new symptoms. Dr. [name] will return for a restaging chest CT and office visit in 2 weeks.  Betsy Coder, MD  02/21/2014  9:22 AM

## 2014-02-21 NOTE — Patient Instructions (Signed)
[facility] Discharge Instructions for Patients Receiving Chemotherapy  Today you received the following chemotherapy agents:  Camptosar, Leucovorin, 5FU and Avastin.  To help prevent nausea and vomiting after your treatment, we encourage you to take your nausea medication as ordered per MD.   If you develop nausea and vomiting that is not controlled by your nausea medication, call the clinic.   BELOW ARE SYMPTOMS THAT SHOULD BE REPORTED IMMEDIATELY:  *FEVER GREATER THAN 100.5 F  *CHILLS WITH OR WITHOUT FEVER  NAUSEA AND VOMITING THAT IS NOT CONTROLLED WITH YOUR NAUSEA MEDICATION  *UNUSUAL SHORTNESS OF BREATH  *UNUSUAL BRUISING OR BLEEDING  TENDERNESS IN MOUTH AND THROAT WITH OR WITHOUT PRESENCE OF ULCERS  *URINARY PROBLEMS  *BOWEL PROBLEMS  UNUSUAL RASH Items with * indicate a potential emergency and should be followed up as soon as possible.  Feel free to call the clinic you have any questions or concerns. The clinic phone number is [phone].

## 2014-02-23 ENCOUNTER — Ambulatory Visit (HOSPITAL_BASED_OUTPATIENT_CLINIC_OR_DEPARTMENT_OTHER): Payer: 59

## 2014-02-23 DIAGNOSIS — C20 Malignant neoplasm of rectum: Secondary | ICD-10-CM

## 2014-02-23 DIAGNOSIS — C7801 Secondary malignant neoplasm of right lung: Secondary | ICD-10-CM

## 2014-02-23 DIAGNOSIS — C7800 Secondary malignant neoplasm of unspecified lung: Principal | ICD-10-CM

## 2014-02-23 MED ORDER — SODIUM CHLORIDE 0.9 % IJ SOLN
10.0000 mL | INTRAMUSCULAR | Status: DC | PRN
  Administered 2014-02-23: 10 mL
  Filled 2014-02-23: qty 10

## 2014-02-23 MED ORDER — HEPARIN SOD (PORK) LOCK FLUSH 100 UNIT/ML IV SOLN
500.0000 [IU] | Freq: Once | INTRAVENOUS | Status: AC | PRN
  Administered 2014-02-23: 500 [IU]
  Filled 2014-02-23: qty 5

## 2014-02-23 NOTE — Patient Instructions (Signed)
Fluorouracil, 5FU; Diclofenac topical cream What is this medicine? FLUOROURACIL; DICLOFENAC (flure oh YOOR a sil; dye KLOE fen ak) is a combination of a topical chemotherapy agent and non-steroidal anti-inflammatory drug (NSAID). It is used on the skin to treat skin cancer and skin conditions that could become cancer. This medicine may be used for other purposes; ask your health care provider or pharmacist if you have questions. COMMON BRAND NAME(S): FLUORAC What should I tell my health care provider before I take this medicine? They need to know if you have any of these conditions: -bleeding problems -cigarette smoker -DPD enzyme deficiency -heart disease -high blood pressure -if you frequently drink alcohol containing drinks -kidney disease -liver disease -open or infected skin -stomach problems -swelling or open sores at the treatment site -recent or planned coronary artery bypass graft (CABG) surgery -an unusual or allergic reaction to fluorouracil, diclofenac, aspirin, other NSAIDs, other medicines, foods, dyes, or preservatives -pregnant or trying to get pregnant -breast-feeding How should I use this medicine? This medicine is only for use on the skin. Follow the directions on the prescription label. Wash hands before and after use. Wash affected area and gently pat dry. To apply this medicine use a cotton-tipped applicator, or use gloves if applying with fingertips. If applied with unprotected fingertips, it is very important to wash your hands well after you apply this medicine. Avoid applying to the eyes, nose, or mouth. Apply enough medicine to cover the affected area. You can cover the area with a light gauze dressing, but do not use tight or air-tight dressings. Finish the full course prescribed by your doctor or health care professional, even if you think your condition is better. Do not stop taking except on the advice of your doctor or health care professional. Talk to your  pediatrician regarding the use of this medicine in children. Special care may be needed. Overdosage: If you think you've taken too much of this medicine contact a poison control center or emergency room at once. Overdosage: If you think you have taken too much of this medicine contact a poison control center or emergency room at once. NOTE: This medicine is only for you. Do not share this medicine with others. What if I miss a dose? If you miss a dose, apply it as soon as you can. If it is almost time for your next dose, only use that dose. Do not apply extra doses. Contact your doctor or health care professional if you miss more than one dose. What may interact with this medicine? Interactions are not expected. Do not use any other skin products without telling your doctor or health care professional. This list may not describe all possible interactions. Give your health care provider a list of all the medicines, herbs, non-prescription drugs, or dietary supplements you use. Also tell them if you smoke, drink alcohol, or use illegal drugs. Some items may interact with your medicine. What should I watch for while using this medicine? Visit your doctor or health care professional for checks on your progress. You will need to use this medicine for 2 to 6 weeks. This may be longer depending on the condition being treated. You may not see full healing for another 1 to 2 months after you stop using the medicine. Treated areas of skin can look unsightly during and for several weeks after treatment with this medicine. This medicine can make you more sensitive to the sun. Keep out of the sun. If you cannot avoid being in  the sun, wear protective clothing and use sunscreen. Do not use sun lamps or tanning beds/booths. Where should I keep my What side effects may I notice from receiving this medicine? Side effects that you should report to your doctor or health care professional as soon as possible: -allergic  reactions like skin rash, itching or hives, swelling of the face, lips, or tongue -black or bloody stools, blood in the urine or vomit -blurred vision -chest pain -difficulty breathing or wheezing -redness, blistering, peeling or loosening of the skin, including inside the mouth -severe redness and swelling of normal skin -slurred speech or weakness on one side of the body -trouble passing urine or change in the amount of urine -unexplained weight gain or swelling -unusually weak or tired -yellowing of eyes or skin Side effects that usually do not require medical attention (Report these to your doctor or health care professional if they continue or are bothersome.): -increased sensitivity of the skin to sun and ultraviolet light -pain and burning of the affected area -scaling or swelling of the affected area -skin rash, itching of the affected area -tenderness This list may not describe all possible side effects. Call your doctor for medical advice about side effects. You may report side effects to FDA at 1-800-FDA-1088. Where should I keep my medicine? Keep out of the reach of children. Store at room temperature between 20 and 25 degrees C (68 and 77 degrees F). Throw away any unused medicine after the expiration date. NOTE: This sheet is a summary. It may not cover all possible information. If you have questions about this medicine, talk to your doctor, pharmacist, or health care provider.  2015, [employer]vier/[employer] Standard. (2013-07-17 11:09:58)

## 2014-03-04 ENCOUNTER — Other Ambulatory Visit: Payer: Self-pay | Admitting: Oncology

## 2014-03-05 ENCOUNTER — Encounter (HOSPITAL_COMMUNITY): Payer: Self-pay

## 2014-03-05 ENCOUNTER — Ambulatory Visit (HOSPITAL_COMMUNITY)
Admission: RE | Admit: 2014-03-05 | Discharge: 2014-03-05 | Disposition: A | Discharge status: Home / Self Care | Payer: 59 | Source: Ambulatory Visit | Attending: Oncology | Admitting: Oncology

## 2014-03-05 ENCOUNTER — Other Ambulatory Visit: Payer: Self-pay | Admitting: Internal Medicine

## 2014-03-05 DIAGNOSIS — C20 Malignant neoplasm of rectum: Secondary | ICD-10-CM | POA: Diagnosis not present

## 2014-03-05 DIAGNOSIS — C7800 Secondary malignant neoplasm of unspecified lung: Secondary | ICD-10-CM | POA: Diagnosis not present

## 2014-03-05 DIAGNOSIS — Z85048 Personal history of other malignant neoplasm of rectum, rectosigmoid junction, and anus: Secondary | ICD-10-CM | POA: Diagnosis not present

## 2014-03-05 DIAGNOSIS — R918 Other nonspecific abnormal finding of lung field: Secondary | ICD-10-CM | POA: Diagnosis present

## 2014-03-05 LAB — HM DIABETES EYE EXAM

## 2014-03-05 MED ORDER — IOHEXOL 300 MG/ML  SOLN
80.0000 mL | Freq: Once | INTRAMUSCULAR | Status: AC | PRN
  Administered 2014-03-05: 80 mL via INTRAVENOUS

## 2014-03-05 NOTE — Telephone Encounter (Signed)
Sent to the pharmacy by e-scribe. 

## 2014-03-06 ENCOUNTER — Telehealth: Payer: Self-pay | Admitting: *Deleted

## 2014-03-06 ENCOUNTER — Encounter: Payer: Self-pay | Admitting: Family Medicine

## 2014-03-06 NOTE — Telephone Encounter (Signed)
Pt called requesting CT results.  Per Dr. Benay Spice; notified pt that CT showed no new lesions, few of the lung nodules are smaller.  Pt verbalized understanding and confirmed appt for 03/07/14.

## 2014-03-07 ENCOUNTER — Other Ambulatory Visit: Payer: Self-pay | Admitting: *Deleted

## 2014-03-07 ENCOUNTER — Ambulatory Visit (HOSPITAL_BASED_OUTPATIENT_CLINIC_OR_DEPARTMENT_OTHER): Payer: 59 | Admitting: Oncology

## 2014-03-07 ENCOUNTER — Other Ambulatory Visit (HOSPITAL_BASED_OUTPATIENT_CLINIC_OR_DEPARTMENT_OTHER): Payer: 59

## 2014-03-07 ENCOUNTER — Ambulatory Visit (HOSPITAL_BASED_OUTPATIENT_CLINIC_OR_DEPARTMENT_OTHER): Payer: 59

## 2014-03-07 ENCOUNTER — Telehealth: Payer: Self-pay | Admitting: Oncology

## 2014-03-07 VITALS — BP 163/79 | HR 85 | Temp 98.0°F | Resp 18 | Ht 64.0 in | Wt 130.3 lb

## 2014-03-07 DIAGNOSIS — C7801 Secondary malignant neoplasm of right lung: Secondary | ICD-10-CM

## 2014-03-07 DIAGNOSIS — C7800 Secondary malignant neoplasm of unspecified lung: Principal | ICD-10-CM

## 2014-03-07 DIAGNOSIS — C20 Malignant neoplasm of rectum: Secondary | ICD-10-CM

## 2014-03-07 DIAGNOSIS — Z5111 Encounter for antineoplastic chemotherapy: Secondary | ICD-10-CM

## 2014-03-07 DIAGNOSIS — Z5112 Encounter for antineoplastic immunotherapy: Secondary | ICD-10-CM

## 2014-03-07 LAB — CBC WITH DIFFERENTIAL/PLATELET
BASO%: 0.8 % (ref 0.0–2.0)
BASOS ABS: 0 10*3/uL (ref 0.0–0.1)
EOS ABS: 0.1 10*3/uL (ref 0.0–0.5)
EOS%: 3.2 % (ref 0.0–7.0)
HCT: 34.7 % — ABNORMAL LOW (ref 34.8–46.6)
HEMOGLOBIN: 11.3 g/dL — AB (ref 11.6–15.9)
LYMPH%: 16.6 % (ref 14.0–49.7)
MCH: 29 pg (ref 25.1–34.0)
MCHC: 32.4 g/dL (ref 31.5–36.0)
MCV: 89.3 fL (ref 79.5–101.0)
MONO#: 0.3 10*3/uL (ref 0.1–0.9)
MONO%: 10.3 % (ref 0.0–14.0)
NEUT%: 69.1 % (ref 38.4–76.8)
NEUTROS ABS: 2.2 10*3/uL (ref 1.5–6.5)
Platelets: 241 10*3/uL (ref 145–400)
RBC: 3.89 10*6/uL (ref 3.70–5.45)
RDW: 17.1 % — AB (ref 11.2–14.5)
WBC: 3.2 10*3/uL — AB (ref 3.9–10.3)
lymph#: 0.5 10*3/uL — ABNORMAL LOW (ref 0.9–3.3)

## 2014-03-07 LAB — COMPREHENSIVE METABOLIC PANEL (CC13)
ALBUMIN: 4.1 g/dL (ref 3.5–5.0)
ALT: 45 U/L (ref 0–55)
ANION GAP: 11 meq/L (ref 3–11)
AST: 24 U/L (ref 5–34)
Alkaline Phosphatase: 57 U/L (ref 40–150)
BUN: 16.1 mg/dL (ref 7.0–26.0)
CHLORIDE: 102 meq/L (ref 98–109)
CO2: 28 meq/L (ref 22–29)
Calcium: 10.4 mg/dL (ref 8.4–10.4)
Creatinine: 0.7 mg/dL (ref 0.6–1.1)
EGFR: 88 mL/min/{1.73_m2} — AB (ref >90)
GLUCOSE: 175 mg/dL — AB (ref 70–140)
POTASSIUM: 4 meq/L (ref 3.5–5.1)
Sodium: 141 mEq/L (ref 136–145)
TOTAL PROTEIN: 6.8 g/dL (ref 6.4–8.3)
Total Bilirubin: 0.36 mg/dL (ref 0.20–1.20)

## 2014-03-07 MED ORDER — DEXAMETHASONE SODIUM PHOSPHATE 20 MG/5ML IJ SOLN
INTRAMUSCULAR | Status: AC
  Filled 2014-03-07: qty 5

## 2014-03-07 MED ORDER — ONDANSETRON 16 MG/50ML IVPB (CHCC)
16.0000 mg | Freq: Once | INTRAVENOUS | Status: AC
  Administered 2014-03-07: 16 mg via INTRAVENOUS

## 2014-03-07 MED ORDER — SODIUM CHLORIDE 0.9 % IV SOLN
2400.0000 mg/m2 | INTRAVENOUS | Status: DC
  Administered 2014-03-07: 3950 mg via INTRAVENOUS
  Filled 2014-03-07: qty 79

## 2014-03-07 MED ORDER — SODIUM CHLORIDE 0.9 % IV SOLN
Freq: Once | INTRAVENOUS | Status: AC
  Administered 2014-03-07: 12:00:00 via INTRAVENOUS

## 2014-03-07 MED ORDER — ONDANSETRON 16 MG/50ML IVPB (CHCC)
INTRAVENOUS | Status: AC
  Filled 2014-03-07: qty 16

## 2014-03-07 MED ORDER — FLUOROURACIL CHEMO INJECTION 2.5 GM/50ML
400.0000 mg/m2 | Freq: Once | INTRAVENOUS | Status: AC
  Administered 2014-03-07: 650 mg via INTRAVENOUS
  Filled 2014-03-07: qty 13

## 2014-03-07 MED ORDER — DEXAMETHASONE SODIUM PHOSPHATE 20 MG/5ML IJ SOLN
20.0000 mg | Freq: Once | INTRAMUSCULAR | Status: AC
  Administered 2014-03-07: 20 mg via INTRAVENOUS

## 2014-03-07 MED ORDER — SODIUM CHLORIDE 0.9 % IV SOLN
5.0000 mg/kg | Freq: Once | INTRAVENOUS | Status: AC
  Administered 2014-03-07: 300 mg via INTRAVENOUS
  Filled 2014-03-07: qty 12

## 2014-03-07 MED ORDER — IRINOTECAN HCL CHEMO INJECTION 100 MG/5ML
300.0000 mg | Freq: Once | INTRAVENOUS | Status: AC
  Administered 2014-03-07: 300 mg via INTRAVENOUS
  Filled 2014-03-07: qty 15

## 2014-03-07 MED ORDER — ATROPINE SULFATE 1 MG/ML IJ SOLN
0.5000 mg | Freq: Once | INTRAMUSCULAR | Status: AC | PRN
  Administered 2014-03-07: 0.5 mg via INTRAVENOUS

## 2014-03-07 MED ORDER — ATROPINE SULFATE 1 MG/ML IJ SOLN
INTRAMUSCULAR | Status: AC
  Filled 2014-03-07: qty 1

## 2014-03-07 MED ORDER — LEUCOVORIN CALCIUM INJECTION 350 MG
400.0000 mg/m2 | Freq: Once | INTRAVENOUS | Status: AC
  Administered 2014-03-07: 660 mg via INTRAVENOUS
  Filled 2014-03-07: qty 33

## 2014-03-07 MED ORDER — OXYCODONE-ACETAMINOPHEN 10-325 MG PO TABS: 1.0000 | ORAL_TABLET | ORAL | Status: DC | PRN

## 2014-03-07 NOTE — Patient Instructions (Signed)
[facility] Discharge Instructions for Patients Receiving Chemotherapy  Today you received the following chemotherapy agents Irinotecan, 5FU, Leucovorin and Avastin  To help prevent nausea and vomiting after your treatment, we encourage you to take your nausea medication as prescribed   If you develop nausea and vomiting that is not controlled by your nausea medication, call the clinic.   BELOW ARE SYMPTOMS THAT SHOULD BE REPORTED IMMEDIATELY:  *FEVER GREATER THAN 100.5 F  *CHILLS WITH OR WITHOUT FEVER  NAUSEA AND VOMITING THAT IS NOT CONTROLLED WITH YOUR NAUSEA MEDICATION  *UNUSUAL SHORTNESS OF BREATH  *UNUSUAL BRUISING OR BLEEDING  TENDERNESS IN MOUTH AND THROAT WITH OR WITHOUT PRESENCE OF ULCERS  *URINARY PROBLEMS  *BOWEL PROBLEMS  UNUSUAL RASH Items with * indicate a potential emergency and should be followed up as soon as possible.  Feel free to call the clinic you have any questions or concerns. The clinic phone number is [phone].

## 2014-03-07 NOTE — Progress Notes (Signed)
[name] OFFICE PROGRESS NOTE   Diagnosis: Rectal cancer  INTERVAL HISTORY:   Anne [name] as scheduled. She completed another cycle of FOLFIRI/Avastin 02/21/2014. She had a "cold "during the week following chemotherapy with a productive cough and rhinorrhea. No fever. These symptoms have resolved. Her oral intake has improved. She takes approximately 1 oxycodone tablet per day for relief of abdominal pain.  Objective:  Vital signs in last 24 hours:  Blood pressure 163/79, pulse 85, temperature 98 F (36.7 C), temperature source Oral, resp. rate 18, height _0  (1.626 m), weight 130 lb 4.8 oz (59.104 kg).    HEENT: No thrush, healing 3-4 mm ulcer at the lower inner lip Resp: Decreased breath sounds at the right lower chest, no respiratory distress Cardio: Regular rate and rhythm GI: No hepatosplenomegaly, nontender, no mass Vascular: Trace edema at the dorsum of the left foot.  Skin: Mild hyperpigmentation of the hands   Portacath/PICC-without erythema  Lab Results:  Lab Results  Component Value Date   WBC 3.2* 03/07/2014   HGB 11.3* 03/07/2014   HCT 34.7* 03/07/2014   MCV 89.3 03/07/2014   PLT 241 03/07/2014   NEUTROABS 2.2 03/07/2014    Lab Results  Component Value Date   CEA 2.2 12/25/2013    Imaging:  Ct Chest W Contrast  03/05/2014   CLINICAL DATA:  Metastatic lung lesions. History of rectal cancer. Subsequent encounter.  EXAM: CT CHEST WITH CONTRAST  TECHNIQUE: Multidetector CT imaging of the chest was performed during intravenous contrast administration.  CONTRAST:  35m OMNIPAQUE IOHEXOL 300 MG/ML  SOLN  COMPARISON:  12/22/2013.  FINDINGS: Soft tissue / Mediastinum: The left-sided Port-A-Cath tip is positioned in the mid SVC. There is no axillary lymphadenopathy. No mediastinal or hilar lymphadenopathy. The heart size is normal. No pericardial effusion.  Lungs / Pleura: Interval development of a small loculation of gas within the right  apical fluid seen previously. The medial right upper lobe nodule which was 11 x 11 mm on the previous study now measures 10 x 10 mm. Other tiny medial right lung nodules appear relatively stable. A 14 x 11 mm nodule seen in the posterior medial right lung (image 28 series 5) was 17 x 11 mm previously. Volume loss in the right hemi thorax is stable and consistent with prior right lower lobectomy.  6 mm left lower lobe pulmonary nodule on image 45 series 5 is unchanged.  Bones: Bone windows reveal no worrisome lytic or sclerotic osseous lesions.  Upper Abdomen: No focal abnormality is seen in the liver or spleen. No evidence for adrenal nodule or mass.  IMPRESSION: Very trace decrease in size of the 2 dominant right lung nodules with other scattered smaller medial right lung nodules appearing relatively stable. No new or progressive pulmonary nodules evident in this patient is status post right lower lobectomy.  Interval development of a tiny collection of gas within the pleural space of the right apex, with evidence of an air-fluid level. In the absence of previous biopsy or procedure, development of a small bronchopleural fistula would be a consideration. Close interval attention and followup is recommended.   Electronically Signed   By: EMisty StanleyM.D.   On: 03/05/2014 16:12    Medications: I have reviewed the patient's current medications.  Assessment/Plan: 1. Metastatic rectal cancer diagnosed July 2014.  Colonoscopy on 10/25/2012 showed a bulky circumferential friable mass lesion in the rectum at approximately 4 cm above the dentate line. The mass was subtotally  obstructing and would not permit passage of the colonoscope proximal. Biopsy showed adenocarcinoma.   Staging CT scans 10/25/2012 showed a right lower lobe lung mass measuring 5 x 4.6 cm; 0.8 cm nodule right upper lobe; masslike rectosigmoid wall thickening, surrounding stranding and adjacent lymphadenopathy.   PET scan 11/02/2012 showed  hypermetabolism in the area of apparent circumferential rectal wall thickening; the 5 cm right lower lobe pulmonary mass was hypermetabolic with SUV max equal 8. No other hypermetabolic lung lesions were evident. Small bilateral pulmonary parenchymal nodules were noted.   She completed 5 cycles of FOLFOX chemotherapy 11/22/2012 through 02/13/2013.   Status post right thoracotomy, bilobectomy (right lower and middle lobectomy) with en bloc resection of the diaphragm 02/27/2013. Pathology showed adenocarcinoma consistent with metastatic colorectal adenocarcinoma, 4.3 cm, negative margins, no lymph node involvement. Negative soft tissue biopsy of the diaphragm x2. Positive for K-ras mutation-codon 13   She completed concurrent radiation and Xeloda for treatment of the rectal tumor 03/06/2013 through 04/11/2013.   Status post low anterior resection with a diverting loop ileostomy on 05/31/2013 at [facility] in [place]. Final pathology showed moderately differentiated adenocarcinoma located in the upper rectum; the tumor measured 2.5 x 2.2 cm; there was invasion through the muscularis propria into pericolic adipose tissue; tumor budding was present. Lymphatic status negative; vascular status negative; proximal and distal surgical margins negative; 2 of 17 lymph nodes showed metastatic adenocarcinoma. Mesenteric tumor deposits were present. Perineural invasion present. [facility] confirmed an additional positive lymph node and the tumor was staged as a T4a   CT abdomen/pelvis 05/12/2013. Metastatic nodules in the lung increased. Chronic loculated right base pneumothorax. Diffuse mucosal thickening of the sigmoid portion of the colon.   Restaging CTs 07/25/2013 with enlargement of bilateral lung nodules, no other evidence of metastatic disease   Ileostomy takedown 08/14/2013   Chest CT 10/02/2013 revealed enlargement of bilateral lung nodules and increased right pleural fluid    Cycle 1 of FOLFIRI/Avastin 10/03/2013   Cycle 6 of FOLFIRI/Avastin 12/12/2013   Restaging CT 12/22/2013 with a decrease in the size of bilateral lung nodules, no evidence of disease progression   Cycle 7 FOLFIRI/Avastin 12/25/2013  Cycle 8 FOLFIRI/Avastin 01/10/2014  Cycle 9 FOLFIRI/Avastin 01/24/2014  Cycle 10 FOLFIRI/Avastin 02/07/2014  Cycle of 11 FOLFIRI/Avastin 02/21/2014  Restaging CT 03/05/2014 with a decrease in the size of 2 dominant right lung nodules, no new nodules, new pleural gas collection at the right apex 2. Status post right VATS, drainage of pleural effusion, visceral and parietal pleural decortication 12/21/2012. Pleural fluid culture positive for viridans Streptococcus. Right pleura biopsies negative for malignancy. Findings favored empyema. 3. Diabetes. 4. Frequent bowel movements, diminished pelvic muscle tone-we made a referral to the pelvic physical therapy clinic 5. Pain in the left leg with tenderness at the left calf and left ischium-MRI of the lumbar spine and pelvis on 10/13/2011 with no evidence of metastatic disease. A right iliac bone "lesion" was felt to most likely represent heterogenous marrow. Edema and enhancement was noted at the left sciatic nerve compared to the right side with no mass lesion 6. Hypertension-maintained on amlodipine 7. Left foot fracture 8. BRIP1 mutation   Disposition:  Anne [name] appears stable. I reviewed the CT images with her. There is been a clear decrease in the size of 2 dominant right lung nodules. We discussed continuing FOLFIRI/Avastin versus switching to "maintenance "therapy. She understands no therapy will be curative. She would like to continue FOLFIRI/Avastin for a few more cycles  since the dominant lung nodules have decreased in size. The plan is to complete 2 more cycles of FOLFIRI/Avastin and then switch to "maintenance "Xeloda/Avastin.  Anne [name] will return for an office visit and chemotherapy in 2  weeks.  She does not appear symptomatic from the small gas collection in the right pleural space. I will ask Dr. Koleen Nimrod to review the CT and recommend follow-up imaging.  Betsy Coder, MD  03/07/2014  11:54 AM

## 2014-03-07 NOTE — Telephone Encounter (Signed)
gv and printed appt sched adn avs for pt for Dec and Jan 2016...sed added tx.

## 2014-03-09 ENCOUNTER — Ambulatory Visit (HOSPITAL_BASED_OUTPATIENT_CLINIC_OR_DEPARTMENT_OTHER): Payer: 59

## 2014-03-09 DIAGNOSIS — C7800 Secondary malignant neoplasm of unspecified lung: Principal | ICD-10-CM

## 2014-03-09 DIAGNOSIS — C20 Malignant neoplasm of rectum: Secondary | ICD-10-CM

## 2014-03-09 DIAGNOSIS — Z452 Encounter for adjustment and management of vascular access device: Secondary | ICD-10-CM

## 2014-03-09 MED ORDER — HEPARIN SOD (PORK) LOCK FLUSH 100 UNIT/ML IV SOLN
500.0000 [IU] | Freq: Once | INTRAVENOUS | Status: AC | PRN
  Administered 2014-03-09: 500 [IU]
  Filled 2014-03-09: qty 5

## 2014-03-09 MED ORDER — SODIUM CHLORIDE 0.9 % IJ SOLN
10.0000 mL | INTRAMUSCULAR | Status: DC | PRN
  Administered 2014-03-09: 10 mL
  Filled 2014-03-09: qty 10

## 2014-03-18 ENCOUNTER — Other Ambulatory Visit: Payer: Self-pay | Admitting: Oncology

## 2014-03-20 ENCOUNTER — Other Ambulatory Visit: Payer: 59

## 2014-03-20 ENCOUNTER — Ambulatory Visit (HOSPITAL_BASED_OUTPATIENT_CLINIC_OR_DEPARTMENT_OTHER): Payer: 59 | Admitting: Lab

## 2014-03-20 ENCOUNTER — Ambulatory Visit (HOSPITAL_BASED_OUTPATIENT_CLINIC_OR_DEPARTMENT_OTHER): Payer: 59 | Admitting: Nurse Practitioner

## 2014-03-20 ENCOUNTER — Ambulatory Visit (HOSPITAL_BASED_OUTPATIENT_CLINIC_OR_DEPARTMENT_OTHER): Payer: 59

## 2014-03-20 DIAGNOSIS — C20 Malignant neoplasm of rectum: Secondary | ICD-10-CM

## 2014-03-20 DIAGNOSIS — Z5112 Encounter for antineoplastic immunotherapy: Secondary | ICD-10-CM

## 2014-03-20 DIAGNOSIS — C7800 Secondary malignant neoplasm of unspecified lung: Secondary | ICD-10-CM

## 2014-03-20 LAB — COMPREHENSIVE METABOLIC PANEL (CC13)
ALBUMIN: 4.1 g/dL (ref 3.5–5.0)
ALT: 40 U/L (ref 0–55)
AST: 25 U/L (ref 5–34)
Alkaline Phosphatase: 54 U/L (ref 40–150)
Anion Gap: 9 mEq/L (ref 3–11)
BUN: 14.4 mg/dL (ref 7.0–26.0)
CO2: 29 mEq/L (ref 22–29)
Calcium: 10.4 mg/dL (ref 8.4–10.4)
Chloride: 101 mEq/L (ref 98–109)
Creatinine: 0.7 mg/dL (ref 0.6–1.1)
EGFR: 83 mL/min/{1.73_m2} — ABNORMAL LOW (ref >90)
GLUCOSE: 189 mg/dL — AB (ref 70–140)
Potassium: 4.1 mEq/L (ref 3.5–5.1)
SODIUM: 140 meq/L (ref 136–145)
Total Bilirubin: 0.3 mg/dL (ref 0.20–1.20)
Total Protein: 6.8 g/dL (ref 6.4–8.3)

## 2014-03-20 LAB — UA PROTEIN, DIPSTICK - CHCC: Protein, ur: 30 mg/dL

## 2014-03-20 LAB — CBC WITH DIFFERENTIAL/PLATELET
BASO%: 0.6 % (ref 0.0–2.0)
BASOS ABS: 0 10*3/uL (ref 0.0–0.1)
EOS%: 2.2 % (ref 0.0–7.0)
Eosinophils Absolute: 0.1 10*3/uL (ref 0.0–0.5)
HCT: 34.3 % — ABNORMAL LOW (ref 34.8–46.6)
HGB: 11 g/dL — ABNORMAL LOW (ref 11.6–15.9)
LYMPH%: 13.1 % — AB (ref 14.0–49.7)
MCH: 28.5 pg (ref 25.1–34.0)
MCHC: 32.1 g/dL (ref 31.5–36.0)
MCV: 88.7 fL (ref 79.5–101.0)
MONO#: 0.4 10*3/uL (ref 0.1–0.9)
MONO%: 9.4 % (ref 0.0–14.0)
NEUT%: 74.7 % (ref 38.4–76.8)
NEUTROS ABS: 3.2 10*3/uL (ref 1.5–6.5)
Platelets: 214 10*3/uL (ref 145–400)
RBC: 3.87 10*6/uL (ref 3.70–5.45)
RDW: 17.1 % — AB (ref 11.2–14.5)
WBC: 4.3 10*3/uL (ref 3.9–10.3)
lymph#: 0.6 10*3/uL — ABNORMAL LOW (ref 0.9–3.3)

## 2014-03-20 MED ORDER — SODIUM CHLORIDE 0.9 % IV SOLN
5.0000 mg/kg | Freq: Once | INTRAVENOUS | Status: AC
  Administered 2014-03-20: 300 mg via INTRAVENOUS
  Filled 2014-03-20: qty 12

## 2014-03-20 MED ORDER — ONDANSETRON 16 MG/50ML IVPB (CHCC)
INTRAVENOUS | Status: AC
  Filled 2014-03-20: qty 16

## 2014-03-20 MED ORDER — FLUOROURACIL CHEMO INJECTION 5 GM/100ML
2400.0000 mg/m2 | INTRAVENOUS | Status: DC
  Administered 2014-03-20: 3950 mg via INTRAVENOUS
  Filled 2014-03-20: qty 79

## 2014-03-20 MED ORDER — IRINOTECAN HCL CHEMO INJECTION 100 MG/5ML
300.0000 mg | Freq: Once | INTRAVENOUS | Status: AC
  Administered 2014-03-20: 300 mg via INTRAVENOUS
  Filled 2014-03-20: qty 15

## 2014-03-20 MED ORDER — SODIUM CHLORIDE 0.9 % IV SOLN
Freq: Once | INTRAVENOUS | Status: AC
  Administered 2014-03-20: 11:00:00 via INTRAVENOUS

## 2014-03-20 MED ORDER — DEXAMETHASONE SODIUM PHOSPHATE 20 MG/5ML IJ SOLN
INTRAMUSCULAR | Status: AC
  Filled 2014-03-20: qty 5

## 2014-03-20 MED ORDER — ATROPINE SULFATE 1 MG/ML IJ SOLN
0.5000 mg | Freq: Once | INTRAMUSCULAR | Status: AC | PRN
  Administered 2014-03-20: 0.5 mg via INTRAVENOUS

## 2014-03-20 MED ORDER — DEXTROSE 5 % IV SOLN
400.0000 mg/m2 | Freq: Once | INTRAVENOUS | Status: AC
  Administered 2014-03-20: 660 mg via INTRAVENOUS
  Filled 2014-03-20: qty 33

## 2014-03-20 MED ORDER — ATROPINE SULFATE 1 MG/ML IJ SOLN
INTRAMUSCULAR | Status: AC
Start: 2014-03-20 — End: 2014-03-20
  Filled 2014-03-20: qty 1

## 2014-03-20 MED ORDER — FLUOROURACIL CHEMO INJECTION 2.5 GM/50ML
400.0000 mg/m2 | Freq: Once | INTRAVENOUS | Status: AC
  Administered 2014-03-20: 650 mg via INTRAVENOUS
  Filled 2014-03-20: qty 13

## 2014-03-20 MED ORDER — ONDANSETRON 16 MG/50ML IVPB (CHCC)
16.0000 mg | Freq: Once | INTRAVENOUS | Status: AC
  Administered 2014-03-20: 16 mg via INTRAVENOUS

## 2014-03-20 MED ORDER — DEXAMETHASONE SODIUM PHOSPHATE 20 MG/5ML IJ SOLN
20.0000 mg | Freq: Once | INTRAMUSCULAR | Status: AC
  Administered 2014-03-20: 20 mg via INTRAVENOUS

## 2014-03-20 NOTE — Progress Notes (Signed)
[name] OFFICE PROGRESS NOTE   Diagnosis:  Rectal cancer  INTERVAL HISTORY:   Anne [name] as scheduled. She completed another cycle of FOLFIRI/Avastin on 03/07/2014. No significant nausea/vomiting. No mouth sores. No diarrhea. Stable appetite. She notes "muscular" pain at the bilateral upper buttocks with standing. The pain does not radiate. No leg weakness or numbness. No bowel or bladder dysfunction.  Objective:  Vital signs in last 24 hours:  There were no vitals taken for this visit.    Resp: Breath sounds diminished right lower lung field. No respiratory distress. Cardio: Regular rate and rhythm. GI: Abdomen soft and nontender. No hepatomegaly. Vascular: Trace edema at the left lower leg. Port-A-Cath without erythema.  Lab Results:  Lab Results  Component Value Date   WBC 4.3 03/20/2014   HGB 11.0* 03/20/2014   HCT 34.3* 03/20/2014   MCV 88.7 03/20/2014   PLT 214 03/20/2014   NEUTROABS 3.2 03/20/2014    Imaging:  No results found.  Medications: I have reviewed the patient's current medications.  Assessment/Plan: 1. Metastatic rectal cancer diagnosed July 2014.  Colonoscopy on 10/25/2012 showed a bulky circumferential friable mass lesion in the rectum at approximately 4 cm above the dentate line. The mass was subtotally obstructing and would not permit passage of the colonoscope proximal. Biopsy showed adenocarcinoma.   Staging CT scans 10/25/2012 showed a right lower lobe lung mass measuring 5 x 4.6 cm; 0.8 cm nodule right upper lobe; masslike rectosigmoid wall thickening, surrounding stranding and adjacent lymphadenopathy.   PET scan 11/02/2012 showed hypermetabolism in the area of apparent circumferential rectal wall thickening; the 5 cm right lower lobe pulmonary mass was hypermetabolic with SUV max equal 8. No other hypermetabolic lung lesions were evident. Small bilateral pulmonary parenchymal nodules were noted.   She  completed 5 cycles of FOLFOX chemotherapy 11/22/2012 through 02/13/2013.   Status post right thoracotomy, bilobectomy (right lower and middle lobectomy) with en bloc resection of the diaphragm 02/27/2013. Pathology showed adenocarcinoma consistent with metastatic colorectal adenocarcinoma, 4.3 cm, negative margins, no lymph node involvement. Negative soft tissue biopsy of the diaphragm x2. Positive for K-ras mutation-codon 13   She completed concurrent radiation and Xeloda for treatment of the rectal tumor 03/06/2013 through 04/11/2013.   Status post low anterior resection with a diverting loop ileostomy on 05/31/2013 at [facility] in [place]. Final pathology showed moderately differentiated adenocarcinoma located in the upper rectum; the tumor measured 2.5 x 2.2 cm; there was invasion through the muscularis propria into pericolic adipose tissue; tumor budding was present. Lymphatic status negative; vascular status negative; proximal and distal surgical margins negative; 2 of 17 lymph nodes showed metastatic adenocarcinoma. Mesenteric tumor deposits were present. Perineural invasion present. [facility] confirmed an additional positive lymph node and the tumor was staged as a T4a   CT abdomen/pelvis 05/12/2013. Metastatic nodules in the lung increased. Chronic loculated right base pneumothorax. Diffuse mucosal thickening of the sigmoid portion of the colon.   Restaging CTs 07/25/2013 with enlargement of bilateral lung nodules, no other evidence of metastatic disease   Ileostomy takedown 08/14/2013   Chest CT 10/02/2013 revealed enlargement of bilateral lung nodules and increased right pleural fluid   Cycle 1 of FOLFIRI/Avastin 10/03/2013   Cycle 6 of FOLFIRI/Avastin 12/12/2013   Restaging CT 12/22/2013 with a decrease in the size of bilateral lung nodules, no evidence of disease progression   Cycle 7 FOLFIRI/Avastin 12/25/2013  Cycle 8 FOLFIRI/Avastin  01/10/2014  Cycle 9 FOLFIRI/Avastin 01/24/2014  Cycle 10  FOLFIRI/Avastin 02/07/2014  Cycle of 11 FOLFIRI/Avastin 02/21/2014  Restaging CT 03/05/2014 with a decrease in the size of 2 dominant right lung nodules, no new nodules, new pleural gas collection at the right apex  Cycle 12 FOLFIRI/Avastin 03/07/2014 2. Status post right VATS, drainage of pleural effusion, visceral and parietal pleural decortication 12/21/2012. Pleural fluid culture positive for viridans Streptococcus. Right pleura biopsies negative for malignancy. Findings favored empyema. 3. Diabetes. 4. Frequent bowel movements, diminished pelvic muscle tone-we made a referral to the pelvic physical therapy clinic 5. Pain in the left leg with tenderness at the left calf and left ischium-MRI of the lumbar spine and pelvis on 10/13/2011 with no evidence of metastatic disease. A right iliac bone "lesion" was felt to most likely represent heterogenous marrow. Edema and enhancement was noted at the left sciatic nerve compared to the right side with no mass lesion 6. Hypertension-maintained on amlodipine 7. Left foot fracture 8. BRIP1 mutation   Disposition: Anne [name] appears stable. She has completed 11 cycles of FOLFIRI/Avastin. Plan to proceed with cycle 12 today as scheduled. We discussed switching to "maintenance" Xeloda/Avastin. We reviewed potential toxicities associated with Xeloda including nausea, mouth sores, diarrhea, hand-foot syndrome, skin hyperpigmentation, conjunctivitis. She reports having "problems" with Xeloda in the past. She prefers to continue FOLFIRI/Avastin for now. She will return for a follow-up visit and the next cycle in 2 weeks. She will contact the office in the interim with any problems.  Plan reviewed with Dr. Benay Spice.   Anne [name] ANP/GNP-BC   03/20/2014  3:14 PM

## 2014-03-20 NOTE — Patient Instructions (Signed)
[facility] Discharge Instructions for Patients Receiving Chemotherapy  Today you received the following chemotherapy agents: Avastin, Leucovorin, Irinotecan, 5FU   To help prevent nausea and vomiting after your treatment, we encourage you to take your nausea medication as prescribed.    If you develop nausea and vomiting that is not controlled by your nausea medication, call the clinic.   BELOW ARE SYMPTOMS THAT SHOULD BE REPORTED IMMEDIATELY:  *FEVER GREATER THAN 100.5 F  *CHILLS WITH OR WITHOUT FEVER  NAUSEA AND VOMITING THAT IS NOT CONTROLLED WITH YOUR NAUSEA MEDICATION  *UNUSUAL SHORTNESS OF BREATH  *UNUSUAL BRUISING OR BLEEDING  TENDERNESS IN MOUTH AND THROAT WITH OR WITHOUT PRESENCE OF ULCERS  *URINARY PROBLEMS  *BOWEL PROBLEMS  UNUSUAL RASH Items with * indicate a potential emergency and should be followed up as soon as possible.  Feel free to call the clinic you have any questions or concerns. The clinic phone number is [phone].

## 2014-03-22 ENCOUNTER — Ambulatory Visit (HOSPITAL_BASED_OUTPATIENT_CLINIC_OR_DEPARTMENT_OTHER): Payer: 59

## 2014-03-22 DIAGNOSIS — C7800 Secondary malignant neoplasm of unspecified lung: Secondary | ICD-10-CM

## 2014-03-22 DIAGNOSIS — C20 Malignant neoplasm of rectum: Secondary | ICD-10-CM

## 2014-03-22 MED ORDER — HEPARIN SOD (PORK) LOCK FLUSH 100 UNIT/ML IV SOLN
500.0000 [IU] | Freq: Once | INTRAVENOUS | Status: AC | PRN
  Administered 2014-03-22: 500 [IU]
  Filled 2014-03-22: qty 5

## 2014-03-22 MED ORDER — SODIUM CHLORIDE 0.9 % IJ SOLN
10.0000 mL | INTRAMUSCULAR | Status: DC | PRN
  Administered 2014-03-22: 10 mL
  Filled 2014-03-22: qty 10

## 2014-04-01 ENCOUNTER — Other Ambulatory Visit: Payer: Self-pay | Admitting: Oncology

## 2014-04-02 ENCOUNTER — Encounter: Payer: Self-pay | Admitting: Internal Medicine

## 2014-04-03 ENCOUNTER — Encounter: Payer: Self-pay | Admitting: Thoracic Surgery (Cardiothoracic Vascular Surgery)

## 2014-04-03 ENCOUNTER — Ambulatory Visit (INDEPENDENT_AMBULATORY_CARE_PROVIDER_SITE_OTHER): Payer: 59 | Admitting: Thoracic Surgery (Cardiothoracic Vascular Surgery)

## 2014-04-03 VITALS — BP 156/87 | HR 92 | Resp 20 | Ht 64.0 in | Wt 126.0 lb

## 2014-04-03 DIAGNOSIS — R918 Other nonspecific abnormal finding of lung field: Secondary | ICD-10-CM

## 2014-04-03 DIAGNOSIS — C20 Malignant neoplasm of rectum: Secondary | ICD-10-CM

## 2014-04-03 DIAGNOSIS — C7800 Secondary malignant neoplasm of unspecified lung: Secondary | ICD-10-CM

## 2014-04-03 NOTE — Progress Notes (Signed)
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 She initially had an isolated lung metastasis. She initially was treated with chemotherapy, but developed a an empyema. We drained the empyema and ultimately resected the lung metastasis. This required an en bloc resection of a portion of the diaphragm. She subsequently had evidence of early recurrence with multiple lung nodules.   She recently started a new chemotherapy regimen with FOLFIRI/Avastin, receiving her first cycle on July 7. She now has had 11 of 12 planned cycles. Her weight is stable. She denies any unusual cough, fevers, chills. She does have some occasional focal anterior left-sided chest discomfort. This is not related to exertion. She does get short of breath with exertion with walking up stairs.  Past Medical History  Diagnosis Date  . Postmenopausal HRT (hormone replacement therapy) 07/01/2011    surgical menopause  . Hx of hyperglycemia 07/01/2011  . Hx of compression fracture of spine 1985    t12 after a  20 foot fall  . Hx of fracture of wrist     after fall   . 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 Hx of colonic polyps   . Early cataract     stoneburner   . High frequency hearing loss     [facility] followed   . Diabetes mellitus without complication   . Complication of anesthesia     pt hadpanic attacks with versed, no versed  . Status post chemotherapy     completed a total of three cycles of Folfox   . Colon cancer     rectal cancer with lung metastasis  . Skin cancer     multiple  . Heart murmur, systolic 04/04/1094    innocent per Dr. Gershon Mussel   . Asthma     as a child none since 1 years old  . Headache(784.0)   . Family history of malignant neoplasm of breast   . Family history of malignant neoplasm of ovary   . 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 Fracture of left foot     2015 trip      Current Outpatient Prescriptions  Medication Sig Dispense Refill  . ACCU-CHEK AVIVA PLUS test strip CHECK BLOOD GLUCOSE (SUGAR) TWICE DAILY 100 each 6  . amLODipine (NORVASC) 10 MG tablet Take 1 tablet (10 mg total) by mouth daily. 90 tablet 3  . Blood Glucose Monitoring Suppl (ACCU-CHEK AVIVA PLUS) W/DEVICE KIT AS DIRECTED 1 kit 0  . Calcium Carbonate (CALCIUM 600 PO) Take 1 tablet by mouth daily.     Marland Kitchen estradiol (MINIVELLE) 0.0375 MG/24HR Place 1 patch onto the skin 2 (two) times a week. Sundays and Thursdays    . JANUVIA 100 MG tablet TAKE 1 TABLET BY MOUTH ONCE DAILY 30 tablet 2  . metFORMIN (GLUCOPHAGE) 1000 MG tablet ONE TABLET TWICE A DAY WITH A MEAL 180 tablet 0  . methylTESTOSTERone (ANDROID) 10 MG capsule Take 10 mg by mouth.    . Multiple Vitamin (MULTIVITAMIN) tablet Take 1 tablet by mouth daily. Senior    . omeprazole (PRILOSEC) 20 MG capsule Take 20 mg by mouth daily.    Marland Kitchen OVER THE COUNTER MEDICATION Place 1 drop into both ears 2 (two) times a week.  Ear drops to decrease wax build up    . oxyCODONE-acetaminophen (PERCOCET) 10-325 MG per tablet Take 1 tablet by mouth every 4 (four) hours as needed for pain. 50 tablet 0  . psyllium (METAMUCIL) 58.6 % powder Take 1 packet by mouth daily.     No current facility-administered medications for this visit.    Physical Exam BP 156/87 mmHg  Pulse 92  Resp 20  Ht $R'5\' 4"'CQ$  (1.626 m)  Wt 126 lb (57.153 kg)  BMI 21.62 kg/m2  SpO36 72% 69 year old woman in no acute distress Well-appearing Alert and oriented 3 Cardiac regular rate and rhythm normal S1 and S2 Lungs with diminished breath sounds at right base otherwise clear No supraclavicular or cervical adenopathy  Diagnostic Tests: CT chest 03/05/2014 CT CHEST WITH CONTRAST  TECHNIQUE: Multidetector CT imaging of the chest was performed during intravenous contrast  administration.  CONTRAST: 48mL OMNIPAQUE IOHEXOL 300 MG/ML SOLN  COMPARISON: 12/22/2013.  FINDINGS: Soft tissue / Mediastinum: The left-sided Port-A-Cath tip is positioned in the mid SVC. There is no axillary lymphadenopathy. No mediastinal or hilar lymphadenopathy. The heart size is normal. No pericardial effusion.  Lungs / Pleura: Interval development of a small loculation of gas within the right apical fluid seen previously. The medial right upper lobe nodule which was 11 x 11 mm on the previous study now measures 10 x 10 mm. Other tiny medial right lung nodules appear relatively stable. A 14 x 11 mm nodule seen in the posterior medial right lung (image 28 series 5) was 17 x 11 mm previously. Volume loss in the right hemi thorax is stable and consistent with prior right lower lobectomy.  6 mm left lower lobe pulmonary nodule on image 45 series 5 is unchanged.  Bones: Bone windows reveal no worrisome lytic or sclerotic osseous lesions.  Upper Abdomen: No focal abnormality is seen in the liver or spleen. No evidence for adrenal nodule or mass.  IMPRESSION: Very trace decrease in size of the 2 dominant right lung nodules with other scattered smaller medial right lung nodules appearing relatively stable. No new or progressive pulmonary nodules evident in this patient is status post right lower lobectomy.  Interval development of a tiny collection of gas within the pleural space of the right apex, with evidence of an air-fluid level. In the absence of previous biopsy or procedure, development of a small bronchopleural fistula would be a consideration. Close interval attention and followup is recommended.   Electronically Signed  By: Misty Stanley M.D.  On: 03/05/2014 16:12  Impression: 69 year old woman with stage IV colon cancer he's had a right lower lobectomy and has multiple lung metastases. She has been on chemotherapy since July and has had a good  partial response to chemotherapy. She is scheduled to have her last cycle of chemotherapy tomorrow.  She had questions about whether SBRT would be an option for the larger lung nodules. I told her I don't think there is any evidence to support that but she will talk to Dr. Benay Spice about it and would like to possibly discuss it with Dr. Tammi Klippel as well.  There is an air-fluid level in the fluid collection at the apex which was new on her CT in December compared to September. The radiologist raised the question of a small bronchopleural fistula. She does not have any symptoms to suggest that. I'm not sure what caused the air, but don't see that there is any need for any further investigation of it at this point in time.  Plan: I will plan to see her back after her next set of scans in approximately 3 months.

## 2014-04-04 ENCOUNTER — Telehealth: Payer: Self-pay | Admitting: Oncology

## 2014-04-04 ENCOUNTER — Ambulatory Visit (HOSPITAL_BASED_OUTPATIENT_CLINIC_OR_DEPARTMENT_OTHER): Payer: 59

## 2014-04-04 ENCOUNTER — Other Ambulatory Visit (HOSPITAL_BASED_OUTPATIENT_CLINIC_OR_DEPARTMENT_OTHER): Payer: 59

## 2014-04-04 ENCOUNTER — Ambulatory Visit (HOSPITAL_BASED_OUTPATIENT_CLINIC_OR_DEPARTMENT_OTHER): Payer: 59 | Admitting: Oncology

## 2014-04-04 ENCOUNTER — Telehealth: Payer: Self-pay | Admitting: *Deleted

## 2014-04-04 VITALS — BP 166/75 | HR 83 | Temp 98.0°F | Resp 19 | Ht 64.0 in | Wt 127.8 lb

## 2014-04-04 DIAGNOSIS — Z5112 Encounter for antineoplastic immunotherapy: Secondary | ICD-10-CM | POA: Diagnosis not present

## 2014-04-04 DIAGNOSIS — Z5111 Encounter for antineoplastic chemotherapy: Secondary | ICD-10-CM

## 2014-04-04 DIAGNOSIS — C20 Malignant neoplasm of rectum: Secondary | ICD-10-CM

## 2014-04-04 DIAGNOSIS — M545 Low back pain: Secondary | ICD-10-CM

## 2014-04-04 DIAGNOSIS — M791 Myalgia: Secondary | ICD-10-CM

## 2014-04-04 DIAGNOSIS — C7800 Secondary malignant neoplasm of unspecified lung: Secondary | ICD-10-CM | POA: Diagnosis not present

## 2014-04-04 DIAGNOSIS — I10 Essential (primary) hypertension: Secondary | ICD-10-CM

## 2014-04-04 LAB — CBC WITH DIFFERENTIAL/PLATELET
BASO%: 1.1 % (ref 0.0–2.0)
Basophils Absolute: 0 10*3/uL (ref 0.0–0.1)
EOS%: 3.4 % (ref 0.0–7.0)
Eosinophils Absolute: 0.1 10*3/uL (ref 0.0–0.5)
HEMATOCRIT: 36.3 % (ref 34.8–46.6)
HGB: 11.5 g/dL — ABNORMAL LOW (ref 11.6–15.9)
LYMPH#: 0.6 10*3/uL — AB (ref 0.9–3.3)
LYMPH%: 15.2 % (ref 14.0–49.7)
MCH: 27.9 pg (ref 25.1–34.0)
MCHC: 31.7 g/dL (ref 31.5–36.0)
MCV: 88 fL (ref 79.5–101.0)
MONO#: 0.4 10*3/uL (ref 0.1–0.9)
MONO%: 9.1 % (ref 0.0–14.0)
NEUT%: 71.2 % (ref 38.4–76.8)
NEUTROS ABS: 2.9 10*3/uL (ref 1.5–6.5)
Platelets: 244 10*3/uL (ref 145–400)
RBC: 4.12 10*6/uL (ref 3.70–5.45)
RDW: 17.4 % — ABNORMAL HIGH (ref 11.2–14.5)
WBC: 4.1 10*3/uL (ref 3.9–10.3)

## 2014-04-04 LAB — COMPREHENSIVE METABOLIC PANEL (CC13)
ALK PHOS: 56 U/L (ref 40–150)
ALT: 29 U/L (ref 0–55)
ANION GAP: 9 meq/L (ref 3–11)
AST: 22 U/L (ref 5–34)
Albumin: 4.3 g/dL (ref 3.5–5.0)
BUN: 14.3 mg/dL (ref 7.0–26.0)
CALCIUM: 10.5 mg/dL — AB (ref 8.4–10.4)
CHLORIDE: 101 meq/L (ref 98–109)
CO2: 30 meq/L — AB (ref 22–29)
Creatinine: 0.7 mg/dL (ref 0.6–1.1)
EGFR: 82 mL/min/{1.73_m2} — AB (ref >90)
Glucose: 127 mg/dl (ref 70–140)
Potassium: 3.9 mEq/L (ref 3.5–5.1)
Sodium: 140 mEq/L (ref 136–145)
TOTAL PROTEIN: 7.1 g/dL (ref 6.4–8.3)
Total Bilirubin: 0.45 mg/dL (ref 0.20–1.20)

## 2014-04-04 LAB — UA PROTEIN, DIPSTICK - CHCC: Protein, ur: NEGATIVE mg/dL

## 2014-04-04 MED ORDER — SODIUM CHLORIDE 0.9 % IV SOLN
Freq: Once | INTRAVENOUS | Status: AC
  Administered 2014-04-04: 10:00:00 via INTRAVENOUS

## 2014-04-04 MED ORDER — SODIUM CHLORIDE 0.9 % IV SOLN
5.0000 mg/kg | Freq: Once | INTRAVENOUS | Status: AC
  Administered 2014-04-04: 300 mg via INTRAVENOUS
  Filled 2014-04-04: qty 12

## 2014-04-04 MED ORDER — DEXAMETHASONE SODIUM PHOSPHATE 20 MG/5ML IJ SOLN
20.0000 mg | Freq: Once | INTRAMUSCULAR | Status: AC
  Administered 2014-04-04: 20 mg via INTRAVENOUS

## 2014-04-04 MED ORDER — ONDANSETRON 16 MG/50ML IVPB (CHCC)
INTRAVENOUS | Status: AC
  Filled 2014-04-04: qty 16

## 2014-04-04 MED ORDER — DEXAMETHASONE SODIUM PHOSPHATE 20 MG/5ML IJ SOLN
INTRAMUSCULAR | Status: AC
  Filled 2014-04-04: qty 5

## 2014-04-04 MED ORDER — OXYCODONE-ACETAMINOPHEN 10-325 MG PO TABS: 1.0000 | ORAL_TABLET | ORAL | Status: DC | PRN

## 2014-04-04 MED ORDER — ATROPINE SULFATE 1 MG/ML IJ SOLN
INTRAMUSCULAR | Status: AC
  Filled 2014-04-04: qty 1

## 2014-04-04 MED ORDER — ONDANSETRON 16 MG/50ML IVPB (CHCC)
16.0000 mg | Freq: Once | INTRAVENOUS | Status: AC
  Administered 2014-04-04: 16 mg via INTRAVENOUS

## 2014-04-04 MED ORDER — SODIUM CHLORIDE 0.9 % IV SOLN
2400.0000 mg/m2 | INTRAVENOUS | Status: DC
  Administered 2014-04-04: 3950 mg via INTRAVENOUS
  Filled 2014-04-04: qty 79

## 2014-04-04 MED ORDER — IRINOTECAN HCL CHEMO INJECTION 100 MG/5ML
182.0000 mg/m2 | Freq: Once | INTRAVENOUS | Status: AC
  Administered 2014-04-04: 300 mg via INTRAVENOUS
  Filled 2014-04-04: qty 15

## 2014-04-04 MED ORDER — LEUCOVORIN CALCIUM INJECTION 350 MG
400.0000 mg/m2 | Freq: Once | INTRAVENOUS | Status: AC
  Administered 2014-04-04: 660 mg via INTRAVENOUS
  Filled 2014-04-04: qty 33

## 2014-04-04 MED ORDER — ATROPINE SULFATE 1 MG/ML IJ SOLN
0.5000 mg | Freq: Once | INTRAMUSCULAR | Status: AC | PRN
  Administered 2014-04-04: 0.5 mg via INTRAVENOUS

## 2014-04-04 MED ORDER — FLUOROURACIL CHEMO INJECTION 2.5 GM/50ML
400.0000 mg/m2 | Freq: Once | INTRAVENOUS | Status: AC
  Administered 2014-04-04: 650 mg via INTRAVENOUS
  Filled 2014-04-04: qty 13

## 2014-04-04 NOTE — Telephone Encounter (Signed)
Per staff message and POF I have scheduled appts. Advised scheduler of appts and the first avail;able is 145pm [name]

## 2014-04-04 NOTE — Progress Notes (Signed)
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 She completed another cycle of FOLFIRI/Avastin 03/20/2014. No nausea or diarrhea. She reports discomfort in the low back/upper buttock area bilaterally. She describes this as a soreness. The discomfort has improved this week. She takes oxycodone in the evening.  Objective:  Vital signs in last 24 hours:  Blood pressure 166/75, pulse 83, temperature 98 F (36.7 C), resp. rate 19, height $RemoveBe'5\' 4"'dPsicetWe$  (1.626 m), weight 127 lb 12.8 oz (57.97 kg), SpO2 100 %.    HEENT: No thrush or ulcers Resp: Decreased breath sounds at the right lower chest, no respiratory distress Cardio: Regular rate and rhythm GI: No hepatomegaly, nontender Vascular: Trace edema at the left ankle and foot  Musculoskeletal: No tenderness at the low back or upper buttock. No rash or mass.   Portacath/PICC-without erythema  Lab Results:  Lab Results  Component Value Date   WBC 4.1 04/04/2014   HGB 11.5* 04/04/2014   HCT 36.3 04/04/2014   MCV 88.0 04/04/2014   PLT 244 04/04/2014   NEUTROABS 2.9 04/04/2014     Medications: I have reviewed the patient's current medications.  Assessment/Plan: 1. Metastatic rectal cancer diagnosed July 2014.  Colonoscopy on 10/25/2012 showed a bulky circumferential friable mass lesion in the rectum at approximately 4 cm above the dentate line. The mass was subtotally obstructing and would not permit passage of the colonoscope proximal. Biopsy showed adenocarcinoma.   Staging CT scans 10/25/2012 showed a right lower lobe lung mass measuring 5 x 4.6 cm; 0.8 cm nodule right upper lobe; masslike rectosigmoid wall thickening, surrounding stranding and adjacent lymphadenopathy.   PET scan 11/02/2012 showed hypermetabolism in the area of apparent circumferential rectal wall thickening; the 5 cm right lower lobe pulmonary mass was hypermetabolic with SUV max  equal 8. No other hypermetabolic lung lesions were evident. Small bilateral pulmonary parenchymal nodules were noted.   She completed 5 cycles of FOLFOX chemotherapy 11/22/2012 through 02/13/2013.   Status post right thoracotomy, bilobectomy (right lower and middle lobectomy) with en bloc resection of the diaphragm 02/27/2013. Pathology showed adenocarcinoma consistent with metastatic colorectal adenocarcinoma, 4.3 cm, negative margins, no lymph node involvement. Negative soft tissue biopsy of the diaphragm x2. Positive for K-ras mutation-codon 13   She completed concurrent radiation and Xeloda for treatment of the rectal tumor 03/06/2013 through 04/11/2013.   Status post low anterior resection with a diverting loop ileostomy on 05/31/2013 at [facility] in [place]. Final pathology showed moderately differentiated adenocarcinoma located in the upper rectum; the tumor measured 2.5 x 2.2 cm; there was invasion through the muscularis propria into pericolic adipose tissue; tumor budding was present. Lymphatic status negative; vascular status negative; proximal and distal surgical margins negative; 2 of 17 lymph nodes showed metastatic adenocarcinoma. Mesenteric tumor deposits were present. Perineural invasion present. [facility] an additional positive [facility] node and the tumor was staged as a T4a   CT abdomen/pelvis 05/12/2013. Metastatic nodules in the lung increased. Chronic loculated right base pneumothorax. Diffuse mucosal thickening of the sigmoid portion of the colon.   Restaging CTs 07/25/2013 with enlargement of bilateral lung nodules, no other evidence of metastatic disease   Ileostomy takedown 08/14/2013   Chest CT 10/02/2013 revealed enlargement of bilateral lung nodules and increased right pleural fluid   Cycle 1 of FOLFIRI/Avastin 10/03/2013   Cycle 6 of FOLFIRI/Avastin 12/12/2013   Restaging CT 12/22/2013 with a decrease in the size  of bilateral  lung nodules, no evidence of disease progression   Cycle 7 FOLFIRI/Avastin 12/25/2013  Cycle 8 FOLFIRI/Avastin 01/10/2014  Cycle 9 FOLFIRI/Avastin 01/24/2014  Cycle 10 FOLFIRI/Avastin 02/07/2014  Cycle of 11 FOLFIRI/Avastin 02/21/2014  Restaging CT 03/05/2014 with a decrease in the size of 2 dominant right lung nodules, no new nodules, new pleural gas collection at the right apex  Cycle 12 FOLFIRI/Avastin 03/07/2014  Cycle 13 FOLFIRI/Avastin 03/20/2014  Cycle 14 FOLFIRI/Avastin 04/04/2014 2. Status post right VATS, drainage of pleural effusion, visceral and parietal pleural decortication 12/21/2012. Pleural fluid culture positive for viridans Streptococcus. Right pleura biopsies negative for malignancy. Findings favored empyema. 3. Diabetes. 4. Frequent bowel movements, diminished pelvic muscle tone-we made a referral to the pelvic physical therapy clinic 5. Pain in the left leg with tenderness at the left calf and left ischium-MRI of the lumbar spine and pelvis on 10/13/2011 with no evidence of metastatic disease. A right iliac bone "lesion" was felt to most likely represent heterogenous marrow. Edema and enhancement was noted at the left sciatic nerve compared to the right side with no mass lesion 6. Hypertension-maintained on amlodipine 7. Left foot fracture 8. 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 The discomfort at the low back/buttock she is likely benign musculoskeletal pain. We will image the pelvis if this discomfort persists.  We again discussed the indication for switching to "maintenance "therapy. I discussed treatment options with Dr. [name] and her family. The plan is to complete 1 more cycle of FOLFIRI/Avastin today. She will then begin every three-week Avastin and Xeloda ( 7 days on/7 days off).  She reports having significant diarrhea when she took Xeloda with radiation. We will look back at the Xeloda dose and begin treatment with a dose  reduction.  She will return for an office visit prior to beginning Xeloda on 04/18/2014.  Betsy Coder, MD  04/04/2014  8:38 AM

## 2014-04-04 NOTE — Patient Instructions (Signed)
[place] Discharge Instructions for Patients Receiving Chemotherapy  Today you received the following chemotherapy agents Irinotecan, 5FU, Leucovorin and Avastin  To help prevent nausea and vomiting after your treatment, we encourage you to take your nausea medication as prescribed   If you develop nausea and vomiting that is not controlled by your nausea medication, call the clinic.   BELOW ARE SYMPTOMS THAT SHOULD BE REPORTED IMMEDIATELY:  *FEVER GREATER THAN 100.5 F  *CHILLS WITH OR WITHOUT FEVER  NAUSEA AND VOMITING THAT IS NOT CONTROLLED WITH YOUR NAUSEA MEDICATION  *UNUSUAL SHORTNESS OF BREATH  *UNUSUAL BRUISING OR BLEEDING  TENDERNESS IN MOUTH AND THROAT WITH OR WITHOUT PRESENCE OF ULCERS  *URINARY PROBLEMS  *BOWEL PROBLEMS  UNUSUAL RASH Items with * indicate a potential emergency and should be followed up as soon as possible.  Feel free to call the clinic you have any questions or concerns. The clinic phone number is [phone].

## 2014-04-04 NOTE — Telephone Encounter (Signed)
Pt confirmed labs/ov per 01/06 POF, gave pt AVS.... KJ, sent msg to add chemo

## 2014-04-05 ENCOUNTER — Other Ambulatory Visit: Payer: Self-pay | Admitting: *Deleted

## 2014-04-05 MED ORDER — CAPECITABINE 500 MG PO TABS: 1000.0000 mg | ORAL_TABLET | Freq: Two times a day (BID) | ORAL | Status: DC

## 2014-04-06 ENCOUNTER — Ambulatory Visit (HOSPITAL_BASED_OUTPATIENT_CLINIC_OR_DEPARTMENT_OTHER): Payer: 59

## 2014-04-06 DIAGNOSIS — C7800 Secondary malignant neoplasm of unspecified lung: Principal | ICD-10-CM

## 2014-04-06 DIAGNOSIS — C20 Malignant neoplasm of rectum: Secondary | ICD-10-CM

## 2014-04-06 DIAGNOSIS — Z452 Encounter for adjustment and management of vascular access device: Secondary | ICD-10-CM

## 2014-04-06 MED ORDER — HEPARIN SOD (PORK) LOCK FLUSH 100 UNIT/ML IV SOLN
500.0000 [IU] | Freq: Once | INTRAVENOUS | Status: AC | PRN
  Administered 2014-04-06: 500 [IU]
  Filled 2014-04-06: qty 5

## 2014-04-06 MED ORDER — SODIUM CHLORIDE 0.9 % IJ SOLN
10.0000 mL | INTRAMUSCULAR | Status: DC | PRN
  Administered 2014-04-06: 10 mL
  Filled 2014-04-06: qty 10

## 2014-04-15 ENCOUNTER — Other Ambulatory Visit: Payer: Self-pay | Admitting: Oncology

## 2014-04-16 ENCOUNTER — Telehealth: Payer: Self-pay | Admitting: Oncology

## 2014-04-16 NOTE — Telephone Encounter (Signed)
Lft msg for pt confirming labs/ov moved to the 19th of Jan and chemo staying on the 20th due to time lapse from MD/chemo, advised pt to call to confirm.... KJ

## 2014-04-17 ENCOUNTER — Telehealth: Payer: Self-pay | Admitting: Oncology

## 2014-04-17 ENCOUNTER — Other Ambulatory Visit (HOSPITAL_BASED_OUTPATIENT_CLINIC_OR_DEPARTMENT_OTHER): Payer: 59

## 2014-04-17 ENCOUNTER — Ambulatory Visit (HOSPITAL_BASED_OUTPATIENT_CLINIC_OR_DEPARTMENT_OTHER): Payer: 59 | Admitting: Oncology

## 2014-04-17 VITALS — BP 147/80 | HR 84 | Temp 97.7°F | Resp 18 | Ht 64.0 in | Wt 131.8 lb

## 2014-04-17 DIAGNOSIS — C20 Malignant neoplasm of rectum: Secondary | ICD-10-CM

## 2014-04-17 DIAGNOSIS — C7800 Secondary malignant neoplasm of unspecified lung: Secondary | ICD-10-CM

## 2014-04-17 DIAGNOSIS — C778 Secondary and unspecified malignant neoplasm of lymph nodes of multiple regions: Secondary | ICD-10-CM

## 2014-04-17 LAB — COMPREHENSIVE METABOLIC PANEL (CC13)
ALBUMIN: 4.3 g/dL (ref 3.5–5.0)
ALT: 34 U/L (ref 0–55)
ANION GAP: 11 meq/L (ref 3–11)
AST: 21 U/L (ref 5–34)
Alkaline Phosphatase: 52 U/L (ref 40–150)
BUN: 14.6 mg/dL (ref 7.0–26.0)
CHLORIDE: 102 meq/L (ref 98–109)
CO2: 27 mEq/L (ref 22–29)
Calcium: 10.3 mg/dL (ref 8.4–10.4)
Creatinine: 0.7 mg/dL (ref 0.6–1.1)
EGFR: 86 mL/min/{1.73_m2} — ABNORMAL LOW (ref >90)
Glucose: 165 mg/dl — ABNORMAL HIGH (ref 70–140)
POTASSIUM: 4 meq/L (ref 3.5–5.1)
SODIUM: 140 meq/L (ref 136–145)
Total Bilirubin: 0.39 mg/dL (ref 0.20–1.20)
Total Protein: 6.9 g/dL (ref 6.4–8.3)

## 2014-04-17 LAB — CBC WITH DIFFERENTIAL/PLATELET
BASO%: 0.9 % (ref 0.0–2.0)
Basophils Absolute: 0 10*3/uL (ref 0.0–0.1)
EOS ABS: 0.1 10*3/uL (ref 0.0–0.5)
EOS%: 3.4 % (ref 0.0–7.0)
HCT: 35.3 % (ref 34.8–46.6)
HEMOGLOBIN: 11.1 g/dL — AB (ref 11.6–15.9)
LYMPH#: 0.5 10*3/uL — AB (ref 0.9–3.3)
LYMPH%: 16.4 % (ref 14.0–49.7)
MCH: 27.6 pg (ref 25.1–34.0)
MCHC: 31.4 g/dL — AB (ref 31.5–36.0)
MCV: 88.1 fL (ref 79.5–101.0)
MONO#: 0.3 10*3/uL (ref 0.1–0.9)
MONO%: 10.1 % (ref 0.0–14.0)
NEUT%: 69.2 % (ref 38.4–76.8)
NEUTROS ABS: 2 10*3/uL (ref 1.5–6.5)
Platelets: 210 10*3/uL (ref 145–400)
RBC: 4.01 10*6/uL (ref 3.70–5.45)
RDW: 17.7 % — ABNORMAL HIGH (ref 11.2–14.5)
WBC: 3 10*3/uL — AB (ref 3.9–10.3)

## 2014-04-17 NOTE — Progress Notes (Signed)
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 She feels well. She completed another cycle of FOLFIRI/Avastin 04/04/2014. No mouth sores, hand/foot pain, bleeding, or symptom of thrombosis. Mild exertional dyspnea persists. No change in the mild edema at the left lower leg and foot.  Objective:  Vital signs in last 24 hours:  Blood pressure 147/80, pulse 84, temperature 97.7 F (36.5 C), temperature source Oral, resp. rate 18, height $RemoveBe'5\' 4"'DGwxvYlrV$  (1.626 m), weight 131 lb 12.8 oz (59.784 kg), SpO2 100 %.    HEENT: No thrush or ulcers Resp: Lungs with decreased breath sounds at the right lower chest, no respiratory distress Cardio: Regular rate and rhythm GI: No hepatomegaly, nontender, no mass Vascular: Trace edema at the left lower leg and foot  Skin: Palms without erythema   Portacath/PICC-without erythema  Lab Results:  Lab Results  Component Value Date   WBC 3.0* 04/17/2014   HGB 11.1* 04/17/2014   HCT 35.3 04/17/2014   MCV 88.1 04/17/2014   PLT 210 04/17/2014   NEUTROABS 2.0 04/17/2014   Medications: I have reviewed the patient's current medications.  Assessment/Plan: 1. Metastatic rectal cancer diagnosed July 2014.  Colonoscopy on 10/25/2012 showed a bulky circumferential friable mass lesion in the rectum at approximately 4 cm above the dentate line. The mass was subtotally obstructing and would not permit passage of the colonoscope proximal. Biopsy showed adenocarcinoma.   Staging CT scans 10/25/2012 showed a right lower lobe lung mass measuring 5 x 4.6 cm; 0.8 cm nodule right upper lobe; masslike rectosigmoid wall thickening, surrounding stranding and adjacent lymphadenopathy.   PET scan 11/02/2012 showed hypermetabolism in the area of apparent circumferential rectal wall thickening; the 5 cm right lower lobe pulmonary mass was hypermetabolic with SUV max equal 8. No other  hypermetabolic lung lesions were evident. Small bilateral pulmonary parenchymal nodules were noted.   She completed 5 cycles of FOLFOX chemotherapy 11/22/2012 through 02/13/2013.   Status post right thoracotomy, bilobectomy (right lower and middle lobectomy) with en bloc resection of the diaphragm 02/27/2013. Pathology showed adenocarcinoma consistent with metastatic colorectal adenocarcinoma, 4.3 cm, negative margins, no lymph node involvement. Negative soft tissue biopsy of the diaphragm x2. Positive for K-ras mutation-codon 13   She completed concurrent radiation and Xeloda for treatment of the rectal tumor 03/06/2013 through 04/11/2013.   Status post low anterior resection with a diverting loop ileostomy on 05/31/2013 at [facility] in [place]. Final pathology showed moderately differentiated adenocarcinoma located in the upper rectum; the tumor measured 2.5 x 2.2 cm; there was invasion through the muscularis propria into pericolic adipose tissue; tumor budding was present. Lymphatic status negative; vascular status negative; proximal and distal surgical margins negative; 2 of 17 lymph nodes showed metastatic adenocarcinoma. Mesenteric tumor deposits were present. Perineural invasion present. [facility] confirmed an additional positive lymph node and the tumor was staged as a T4a   CT abdomen/pelvis 05/12/2013. Metastatic nodules in the lung increased. Chronic loculated right base pneumothorax. Diffuse mucosal thickening of the sigmoid portion of the colon.   Restaging CTs 07/25/2013 with enlargement of bilateral lung nodules, no other evidence of metastatic disease   Ileostomy takedown 08/14/2013   Chest CT 10/02/2013 revealed enlargement of bilateral lung nodules and increased right pleural fluid   Cycle 1 of FOLFIRI/Avastin 10/03/2013   Cycle 6 of FOLFIRI/Avastin 12/12/2013   Restaging CT 12/22/2013 with a decrease in the size of bilateral lung nodules, no  evidence of disease progression   Cycle 7 FOLFIRI/Avastin 12/25/2013  Cycle 8 FOLFIRI/Avastin 01/10/2014  Cycle 9 FOLFIRI/Avastin 01/24/2014  Cycle 10 FOLFIRI/Avastin 02/07/2014  Cycle of 11 FOLFIRI/Avastin 02/21/2014  Restaging CT 03/05/2014 with a decrease in the size of 2 dominant right lung nodules, no new nodules, new pleural gas collection at the right apex  Cycle 12 FOLFIRI/Avastin 03/07/2014  Cycle 13 FOLFIRI/Avastin 03/20/2014  Cycle 14 FOLFIRI/Avastin 04/04/2014  Initiation of maintenance Xeloda/Avastin 04/18/2014 2. Status post right VATS, drainage of pleural effusion, visceral and parietal pleural decortication 12/21/2012. Pleural fluid culture positive for viridans Streptococcus. Right pleura biopsies negative for malignancy. Findings favored empyema. 3. Diabetes. 4. Frequent bowel movements, diminished pelvic muscle tone-we made a referral to the pelvic physical therapy clinic 5. Pain in the left leg with tenderness at the left calf and left ischium-MRI of the lumbar spine and pelvis on 10/13/2011 with no evidence of metastatic disease. A right iliac bone "lesion" was felt to most likely represent heterogenous marrow. Edema and enhancement was noted at the left sciatic nerve compared to the right side with no mass lesion 6. Hypertension-maintained on amlodipine 7. Left foot fracture 8. 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 The plan is to begin maintenance therapy with every other week Xeloda, and every three-week Avastin on 04/18/2014. We again reviewed the potential toxicities associated with Xeloda including the chance for diarrhea. She will discontinue Xeloda and contact us if she develops diarrhea.  Dr. [name] will return for an office visit and Avastin on 05/09/2014.  Betsy Coder, MD  04/17/2014  2:23 PM

## 2014-04-17 NOTE — Telephone Encounter (Signed)
s.w pt and advised on Jan and Feb appts ...ok a;nd aware

## 2014-04-18 ENCOUNTER — Ambulatory Visit: Payer: 59 | Admitting: Oncology

## 2014-04-18 ENCOUNTER — Other Ambulatory Visit: Payer: 59

## 2014-04-18 ENCOUNTER — Ambulatory Visit (HOSPITAL_BASED_OUTPATIENT_CLINIC_OR_DEPARTMENT_OTHER): Payer: 59

## 2014-04-18 DIAGNOSIS — C20 Malignant neoplasm of rectum: Secondary | ICD-10-CM | POA: Diagnosis not present

## 2014-04-18 DIAGNOSIS — C7801 Secondary malignant neoplasm of right lung: Secondary | ICD-10-CM

## 2014-04-18 DIAGNOSIS — C7800 Secondary malignant neoplasm of unspecified lung: Principal | ICD-10-CM

## 2014-04-18 DIAGNOSIS — Z5112 Encounter for antineoplastic immunotherapy: Secondary | ICD-10-CM

## 2014-04-18 MED ORDER — SODIUM CHLORIDE 0.9 % IJ SOLN
10.0000 mL | INTRAMUSCULAR | Status: DC | PRN
  Administered 2014-04-18: 10 mL
  Filled 2014-04-18: qty 10

## 2014-04-18 MED ORDER — SODIUM CHLORIDE 0.9 % IV SOLN
7.5000 mg/kg | Freq: Once | INTRAVENOUS | Status: AC
  Administered 2014-04-18: 425 mg via INTRAVENOUS
  Filled 2014-04-18: qty 17

## 2014-04-18 MED ORDER — HEPARIN SOD (PORK) LOCK FLUSH 100 UNIT/ML IV SOLN
500.0000 [IU] | Freq: Once | INTRAVENOUS | Status: AC | PRN
  Administered 2014-04-18: 500 [IU]
  Filled 2014-04-18: qty 5

## 2014-04-18 MED ORDER — SODIUM CHLORIDE 0.9 % IV SOLN
Freq: Once | INTRAVENOUS | Status: AC
  Administered 2014-04-18: 14:00:00 via INTRAVENOUS

## 2014-04-18 NOTE — Patient Instructions (Signed)
[place] Discharge Instructions for Patients Receiving Chemotherapy  Today you received the following chemotherapy agents Avastin  To help prevent nausea and vomiting after your treatment, we encourage you to take your nausea medication as prescribed   If you develop nausea and vomiting that is not controlled by your nausea medication, call the clinic.   BELOW ARE SYMPTOMS THAT SHOULD BE REPORTED IMMEDIATELY:  *FEVER GREATER THAN 100.5 F  *CHILLS WITH OR WITHOUT FEVER  NAUSEA AND VOMITING THAT IS NOT CONTROLLED WITH YOUR NAUSEA MEDICATION  *UNUSUAL SHORTNESS OF BREATH  *UNUSUAL BRUISING OR BLEEDING  TENDERNESS IN MOUTH AND THROAT WITH OR WITHOUT PRESENCE OF ULCERS  *URINARY PROBLEMS  *BOWEL PROBLEMS  UNUSUAL RASH Items with * indicate a potential emergency and should be followed up as soon as possible.  Feel free to call the clinic you have any questions or concerns. The clinic phone number is [phone].

## 2014-04-23 ENCOUNTER — Telehealth: Payer: Self-pay | Admitting: *Deleted

## 2014-04-24 ENCOUNTER — Ambulatory Visit: Payer: 59 | Admitting: Thoracic Surgery (Cardiothoracic Vascular Surgery)

## 2014-04-25 ENCOUNTER — Other Ambulatory Visit: Payer: Self-pay | Admitting: *Deleted

## 2014-04-25 DIAGNOSIS — C20 Malignant neoplasm of rectum: Secondary | ICD-10-CM

## 2014-04-25 MED ORDER — OXYCODONE-ACETAMINOPHEN 10-325 MG PO TABS: 1.0000 | ORAL_TABLET | ORAL | Status: DC | PRN

## 2014-04-25 NOTE — Telephone Encounter (Signed)
Unable to print, second order entered by different nurse.

## 2014-05-01 ENCOUNTER — Ambulatory Visit: Payer: 59 | Admitting: Thoracic Surgery (Cardiothoracic Vascular Surgery)

## 2014-05-06 ENCOUNTER — Other Ambulatory Visit: Payer: Self-pay | Admitting: Oncology

## 2014-05-07 ENCOUNTER — Other Ambulatory Visit: Payer: Self-pay | Admitting: Internal Medicine

## 2014-05-07 ENCOUNTER — Telehealth: Payer: Self-pay | Admitting: Family Medicine

## 2014-05-07 ENCOUNTER — Other Ambulatory Visit: Payer: Self-pay | Admitting: Family Medicine

## 2014-05-07 DIAGNOSIS — E119 Type 2 diabetes mellitus without complications: Secondary | ICD-10-CM

## 2014-05-07 NOTE — Telephone Encounter (Signed)
Ok to refillx 3 months  At that time needs hg a1c and ROV   Reviewed record  Has been under active rx and lab monitoring otherwise but  Oncologist  And special sits.

## 2014-05-07 NOTE — Telephone Encounter (Signed)
Pt did not come in for her follow up visit.

## 2014-05-07 NOTE — Telephone Encounter (Signed)
The patient needs a lab and follow up appointment in the next 3 months.  I have placed the order for lab work.  Please help the pt to make both appointments.  Thanks!

## 2014-05-07 NOTE — Telephone Encounter (Signed)
Sent to the pharmacy by e-scribe. Message sent to scheduling to help the pt get an appt.

## 2014-05-07 NOTE — Telephone Encounter (Signed)
Pt has been sch

## 2014-05-09 ENCOUNTER — Telehealth: Payer: Self-pay | Admitting: *Deleted

## 2014-05-09 ENCOUNTER — Ambulatory Visit (HOSPITAL_BASED_OUTPATIENT_CLINIC_OR_DEPARTMENT_OTHER): Payer: 59

## 2014-05-09 ENCOUNTER — Ambulatory Visit (HOSPITAL_BASED_OUTPATIENT_CLINIC_OR_DEPARTMENT_OTHER): Payer: 59 | Admitting: Oncology

## 2014-05-09 ENCOUNTER — Other Ambulatory Visit: Payer: Self-pay | Admitting: Oncology

## 2014-05-09 ENCOUNTER — Other Ambulatory Visit (HOSPITAL_BASED_OUTPATIENT_CLINIC_OR_DEPARTMENT_OTHER): Payer: 59

## 2014-05-09 ENCOUNTER — Telehealth: Payer: Self-pay | Admitting: Oncology

## 2014-05-09 ENCOUNTER — Other Ambulatory Visit: Payer: Self-pay | Admitting: *Deleted

## 2014-05-09 VITALS — BP 157/70 | HR 83 | Temp 98.1°F | Resp 18 | Ht 64.0 in | Wt 130.0 lb

## 2014-05-09 DIAGNOSIS — C7800 Secondary malignant neoplasm of unspecified lung: Secondary | ICD-10-CM

## 2014-05-09 DIAGNOSIS — Z5112 Encounter for antineoplastic immunotherapy: Secondary | ICD-10-CM | POA: Diagnosis not present

## 2014-05-09 DIAGNOSIS — C20 Malignant neoplasm of rectum: Secondary | ICD-10-CM

## 2014-05-09 LAB — COMPREHENSIVE METABOLIC PANEL (CC13)
ALBUMIN: 4.4 g/dL (ref 3.5–5.0)
ALT: 21 U/L (ref 0–55)
ANION GAP: 11 meq/L (ref 3–11)
AST: 19 U/L (ref 5–34)
Alkaline Phosphatase: 55 U/L (ref 40–150)
BUN: 13 mg/dL (ref 7.0–26.0)
CO2: 28 mEq/L (ref 22–29)
CREATININE: 0.7 mg/dL (ref 0.6–1.1)
Calcium: 10.4 mg/dL (ref 8.4–10.4)
Chloride: 102 mEq/L (ref 98–109)
EGFR: 89 mL/min/{1.73_m2} — ABNORMAL LOW (ref >90)
GLUCOSE: 157 mg/dL — AB (ref 70–140)
Potassium: 4.2 mEq/L (ref 3.5–5.1)
Sodium: 141 mEq/L (ref 136–145)
Total Bilirubin: 0.5 mg/dL (ref 0.20–1.20)
Total Protein: 7.1 g/dL (ref 6.4–8.3)

## 2014-05-09 LAB — CBC WITH DIFFERENTIAL/PLATELET
BASO%: 1.1 % (ref 0.0–2.0)
Basophils Absolute: 0.1 10*3/uL (ref 0.0–0.1)
EOS ABS: 0.2 10*3/uL (ref 0.0–0.5)
EOS%: 3.2 % (ref 0.0–7.0)
HCT: 36.8 % (ref 34.8–46.6)
HGB: 11.8 g/dL (ref 11.6–15.9)
LYMPH#: 0.8 10*3/uL — AB (ref 0.9–3.3)
LYMPH%: 15.6 % (ref 14.0–49.7)
MCH: 28.5 pg (ref 25.1–34.0)
MCHC: 32 g/dL (ref 31.5–36.0)
MCV: 89.1 fL (ref 79.5–101.0)
MONO#: 0.4 10*3/uL (ref 0.1–0.9)
MONO%: 7.8 % (ref 0.0–14.0)
NEUT%: 72.3 % (ref 38.4–76.8)
NEUTROS ABS: 3.5 10*3/uL (ref 1.5–6.5)
Platelets: 303 10*3/uL (ref 145–400)
RBC: 4.13 10*6/uL (ref 3.70–5.45)
RDW: 18.2 % — AB (ref 11.2–14.5)
WBC: 4.9 10*3/uL (ref 3.9–10.3)

## 2014-05-09 MED ORDER — CAPECITABINE 500 MG PO TABS: ORAL_TABLET | ORAL | Status: DC

## 2014-05-09 MED ORDER — BEVACIZUMAB CHEMO INJECTION 400 MG/16ML
7.5000 mg/kg | Freq: Once | INTRAVENOUS | Status: AC
  Administered 2014-05-09: 425 mg via INTRAVENOUS
  Filled 2014-05-09: qty 17

## 2014-05-09 MED ORDER — HEPARIN SOD (PORK) LOCK FLUSH 100 UNIT/ML IV SOLN
500.0000 [IU] | Freq: Once | INTRAVENOUS | Status: AC | PRN
  Administered 2014-05-09: 500 [IU]
  Filled 2014-05-09: qty 5

## 2014-05-09 MED ORDER — SODIUM CHLORIDE 0.9 % IV SOLN
Freq: Once | INTRAVENOUS | Status: AC
  Administered 2014-05-09: 10:00:00 via INTRAVENOUS

## 2014-05-09 MED ORDER — OXYCODONE-ACETAMINOPHEN 10-325 MG PO TABS: 1.0000 | ORAL_TABLET | ORAL | Status: DC | PRN

## 2014-05-09 MED ORDER — SODIUM CHLORIDE 0.9 % IJ SOLN
10.0000 mL | INTRAMUSCULAR | Status: DC | PRN
  Administered 2014-05-09: 10 mL
  Filled 2014-05-09: qty 10

## 2014-05-09 NOTE — Telephone Encounter (Signed)
Called pharmacy to inform them of dose change for Xeloda, provider is Dr. Benay Spice. (refill request had been sent to another MD)

## 2014-05-09 NOTE — Patient Instructions (Signed)
Bevacizumab injection What is this medicine? BEVACIZUMAB (be va SIZ yoo mab) is a chemotherapy drug. It targets a protein found in many cancer cell types, and halts cancer growth. This drug treats many cancers including non-small cell lung cancer, ovarian cancer, cervical cancer, and colon or rectal cancer. It is usually given with other chemotherapy drugs. This medicine may be used for other purposes; ask your health care provider or pharmacist if you have questions. COMMON BRAND NAME(S): Avastin What should I tell my health care provider before I take this medicine? They need to know if you have any of these conditions: -blood clots -heart disease, including heart failure, heart attack, or chest pain (angina) -high blood pressure -infection (especially a virus infection such as chickenpox, cold sores, or herpes) -kidney disease -lung disease -prior chemotherapy with doxorubicin, daunorubicin, epirubicin, or other anthracycline type chemotherapy agents -recent or ongoing radiation therapy -recent surgery -stroke -an unusual or allergic reaction to bevacizumab, hamster proteins, mouse proteins, other medicines, foods, dyes, or preservatives -pregnant or trying to get pregnant -breast-feeding How should I use this medicine? This medicine is for infusion into a vein. It is given by a health care professional in a hospital or clinic setting. Talk to your pediatrician regarding the use of this medicine in children. Special care may be needed. Overdosage: If you think you have taken too much of this medicine contact a poison control center or emergency room at once. NOTE: This medicine is only for you. Do not share this medicine with others. What if I miss a dose? It is important not to miss your dose. Call your doctor or health care professional if you are unable to keep an appointment. What may interact with this medicine? Interactions are not expected. This list may not describe all  possible interactions. Give your health care provider a list of all the medicines, herbs, non-prescription drugs, or dietary supplements you use. Also tell them if you smoke, drink alcohol, or use illegal drugs. Some items may interact with your medicine. What should I watch for while using this medicine? Your condition will be monitored carefully while you are receiving this medicine. You will need important blood work and urine testing done while you are taking this medicine. During your treatment, let your health care professional know if you have any unusual symptoms, such as difficulty breathing. This medicine may rarely cause 'gastrointestinal perforation' (holes in the stomach, intestines or colon), a serious side effect requiring surgery to repair. This medicine should be started at least 28 days following major surgery and the site of the surgery should be totally healed. Check with your doctor before scheduling dental work or surgery while you are receiving this treatment. Talk to your doctor if you have recently had surgery or if you have a wound that has not healed. Do not become pregnant while taking this medicine. Women should inform their doctor if they wish to become pregnant or think they might be pregnant. There is a potential for serious side effects to an unborn child. Talk to your health care professional or pharmacist for more information. Do not breast-feed an infant while taking this medicine. This medicine has caused ovarian failure in some women. This medicine may interfere with the ability to have a child. You should talk to your doctor or health care professional if you are concerned about your fertility. What side effects may I notice from receiving this medicine? Side effects that you should report to your doctor or health care professional  as soon as possible: -allergic reactions like skin rash, itching or hives, swelling of the face, lips, or tongue -signs of infection -  fever or chills, cough, sore throat, pain or trouble passing urine -signs of decreased platelets or bleeding - bruising, pinpoint red spots on the skin, black, tarry stools, nosebleeds, blood in the urine -breathing problems -changes in vision -chest pain -confusion -jaw pain, especially after dental work -mouth sores -seizures -severe abdominal pain -severe headache -sudden numbness or weakness of the face, arm or leg -swelling of legs or ankles -symptoms of a stroke: change in mental awareness, inability to talk or move one side of the body (especially in patients with lung cancer) -trouble passing urine or change in the amount of urine -trouble speaking or understanding -trouble walking, dizziness, loss of balance or coordination Side effects that usually do not require medical attention (report to your doctor or health care professional if they continue or are bothersome): -constipation -diarrhea -dry skin -headache -loss of appetite -nausea, vomiting This list may not describe all possible side effects. Call your doctor for medical advice about side effects. You may report side effects to FDA at 1-800-FDA-1088. Where should I keep my medicine? This drug is given in a hospital or clinic and will not be stored at home. NOTE: This sheet is a summary. It may not cover all possible information. If you have questions about this medicine, talk to your doctor, pharmacist, or health care provider.  2015, [employer]. (2013-02-14 11:38:34)

## 2014-05-09 NOTE — Addendum Note (Signed)
Addended by: Brien Few on: 05/09/2014 11:14 AM   Modules accepted: Orders

## 2014-05-09 NOTE — Telephone Encounter (Signed)
Pt confirmed labs/ov per 02/10 POF, gave pt AVS.... KJ, sent msg to add Avastin, sent msg to have MD visit at 8:30, sent msg to MD pt requesting written prescription for her [place].Marland KitchenMarland KitchenMarland Kitchen

## 2014-05-09 NOTE — Progress Notes (Signed)
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 She began maintenance Xeloda last month. She reports tolerating the Xeloda well. No mouth sores, hand/foot pain, or diarrhea. She has intermittent "cramping "abdominal pain. The left foot edema has improved. No bleeding or symptom of thrombosis.  Objective:  Vital signs in last 24 hours:  Blood pressure 157/70, pulse 83, temperature 98.1 F (36.7 C), temperature source Oral, resp. rate 18, height _0  (1.626 m), weight 130 lb (58.968 kg), SpO2 99 %.    HEENT: ? Mild thrush at the right buccal mucosa, no ulcers Resp: Decreased breath sounds at the right lower chest, no respiratory distress Cardio: Regular rate and rhythm GI: No hepatomegaly, nontender Vascular: Trace edema at the left foot  Skin: Palms without erythema   Portacath/PICC-without erythema  Lab Results:  Lab Results  Component Value Date   WBC 4.9 05/09/2014   HGB 11.8 05/09/2014   HCT 36.8 05/09/2014   MCV 89.1 05/09/2014   PLT 303 05/09/2014   NEUTROABS 3.5 05/09/2014     Lab Results  Component Value Date   CEA 2.2 12/25/2013    Imaging:  No results found.  Medications: I have reviewed the patient's current medications.  Assessment/Plan: 1. Metastatic rectal cancer diagnosed July 2014.  Colonoscopy on 10/25/2012 showed a bulky circumferential friable mass lesion in the rectum at approximately 4 cm above the dentate line. The mass was subtotally obstructing and would not permit passage of the colonoscope proximal. Biopsy showed adenocarcinoma.   Staging CT scans 10/25/2012 showed a right lower lobe lung mass measuring 5 x 4.6 cm; 0.8 cm nodule right upper lobe; masslike rectosigmoid wall thickening, surrounding stranding and adjacent lymphadenopathy.   PET scan 11/02/2012 showed hypermetabolism in the area of apparent circumferential rectal wall thickening; the 5  cm right lower lobe pulmonary mass was hypermetabolic with SUV max equal 8. No other hypermetabolic lung lesions were evident. Small bilateral pulmonary parenchymal nodules were noted.   She completed 5 cycles of FOLFOX chemotherapy 11/22/2012 through 02/13/2013.   Status post right thoracotomy, bilobectomy (right lower and middle lobectomy) with en bloc resection of the diaphragm 02/27/2013. Pathology showed adenocarcinoma consistent with metastatic colorectal adenocarcinoma, 4.3 cm, negative margins, no lymph node involvement. Negative soft tissue biopsy of the diaphragm x2. Positive for K-ras mutation-codon 13   She completed concurrent radiation and Xeloda for treatment of the rectal tumor 03/06/2013 through 04/11/2013.   Status post low anterior resection with a diverting loop ileostomy on 05/31/2013 at [facility] in [place]. Final pathology showed moderately differentiated adenocarcinoma located in the upper rectum; the tumor measured 2.5 x 2.2 cm; there was invasion through the muscularis propria into pericolic adipose tissue; tumor budding was present. Lymphatic status negative; vascular status negative; proximal and distal surgical margins negative; 2 of 17 lymph nodes showed metastatic adenocarcinoma. Mesenteric tumor deposits were present. Perineural invasion present. [facility] an additional positive [facility] node and the tumor was staged as a T4a   CT abdomen/pelvis 05/12/2013. Metastatic nodules in the lung increased. Chronic loculated right base pneumothorax. Diffuse mucosal thickening of the sigmoid portion of the colon.   Restaging CTs 07/25/2013 with enlargement of bilateral lung nodules, no other evidence of metastatic disease   Ileostomy takedown 08/14/2013   Chest CT 10/02/2013 revealed enlargement of bilateral lung nodules and increased right pleural fluid   Cycle 1 of FOLFIRI/Avastin 10/03/2013   Cycle 6 of  FOLFIRI/Avastin 12/12/2013    Restaging CT 12/22/2013 with a decrease in the size of bilateral lung nodules, no evidence of disease progression   Cycle 7 FOLFIRI/Avastin 12/25/2013  Cycle 8 FOLFIRI/Avastin 01/10/2014  Cycle 9 FOLFIRI/Avastin 01/24/2014  Cycle 10 FOLFIRI/Avastin 02/07/2014  Cycle of 11 FOLFIRI/Avastin 02/21/2014  Restaging CT 03/05/2014 with a decrease in the size of 2 dominant right lung nodules, no new nodules, new pleural gas collection at the right apex  Cycle 12 FOLFIRI/Avastin 03/07/2014  Cycle 13 FOLFIRI/Avastin 03/20/2014  Cycle 14 FOLFIRI/Avastin 04/04/2014  Initiation of maintenance Xeloda/Avastin 04/18/2014 2. Status post right VATS, drainage of pleural effusion, visceral and parietal pleural decortication 12/21/2012. Pleural fluid culture positive for viridans Streptococcus. Right pleura biopsies negative for malignancy. Findings favored empyema. 3. Diabetes. 4. Frequent bowel movements, diminished pelvic muscle tone-we made a referral to the pelvic physical therapy clinic 5. Pain in the left leg with tenderness at the left calf and left ischium-MRI of the lumbar spine and pelvis on 10/13/2011 with no evidence of metastatic disease. A right iliac bone "lesion" was felt to most likely represent heterogenous marrow. Edema and enhancement was noted at the left sciatic nerve compared to the right side with no mass lesion 6. Hypertension-maintained on amlodipine 7. Left foot fracture 8. BRIP1 mutation   Disposition:  She tolerated the first 2 weeks of Xeloda well. We dose reduced the Xeloda secondary to her history of diarrhea with Xeloda in the past. We will dose escalate the Xeloda by 500 mg daily beginning with the next cycle.  She continues every three-week Avastin. She will return for an office visit and Avastin in 3 weeks.  Betsy Coder, MD  05/09/2014  8:53 AM

## 2014-05-09 NOTE — Telephone Encounter (Signed)
Per staff message and POF I have scheduled appts. Advised scheduler of appts. [name]  

## 2014-05-18 NOTE — Telephone Encounter (Signed)
none

## 2014-05-27 ENCOUNTER — Other Ambulatory Visit: Payer: Self-pay | Admitting: Oncology

## 2014-05-30 ENCOUNTER — Other Ambulatory Visit: Payer: 59

## 2014-05-30 ENCOUNTER — Telehealth: Payer: Self-pay | Admitting: *Deleted

## 2014-05-30 ENCOUNTER — Ambulatory Visit: Payer: 59 | Admitting: Oncology

## 2014-05-30 ENCOUNTER — Ambulatory Visit: Payer: 59

## 2014-05-30 ENCOUNTER — Telehealth: Payer: Self-pay | Admitting: Oncology

## 2014-05-30 NOTE — Telephone Encounter (Signed)
Message from Dr. [name] apologizing.  "Looked at the wrong schedule.  Can you call and let me know if Need to come in before 12:00 for labs and see him."  This nurse left message for collaborative nurse.

## 2014-05-30 NOTE — Telephone Encounter (Signed)
Per Dr. Benay Spice; notified pt re-scheduled MD appt for 3/4 @ 8:15 and then will  tx.  Pt verbalized understanding and expressed appreciation for call.

## 2014-05-30 NOTE — Telephone Encounter (Signed)
Labs/ov r/s from 03/02 to 03/04 per 03/02 POF, sent msg to r/s chemo and sent msg to MD/AM to override apt 03/04 at 8:15 with MD.... Pt is aware of the time.... KJ

## 2014-05-30 NOTE — Telephone Encounter (Signed)
Left voice message on home/cell #'s re: missed appt today; please call office to re-schedule.

## 2014-05-30 NOTE — Telephone Encounter (Signed)
Per staff message and POF I have scheduled appts. Advised scheduler of appts. [name]  

## 2014-06-01 ENCOUNTER — Telehealth: Payer: Self-pay | Admitting: *Deleted

## 2014-06-01 ENCOUNTER — Telehealth: Payer: Self-pay | Admitting: Oncology

## 2014-06-01 ENCOUNTER — Other Ambulatory Visit: Payer: Self-pay | Admitting: *Deleted

## 2014-06-01 ENCOUNTER — Ambulatory Visit (HOSPITAL_BASED_OUTPATIENT_CLINIC_OR_DEPARTMENT_OTHER): Payer: 59

## 2014-06-01 ENCOUNTER — Ambulatory Visit (HOSPITAL_BASED_OUTPATIENT_CLINIC_OR_DEPARTMENT_OTHER): Payer: 59 | Admitting: Oncology

## 2014-06-01 ENCOUNTER — Other Ambulatory Visit (HOSPITAL_BASED_OUTPATIENT_CLINIC_OR_DEPARTMENT_OTHER): Payer: 59

## 2014-06-01 VITALS — BP 161/78 | HR 80 | Temp 98.3°F | Resp 18 | Ht 64.0 in | Wt 131.3 lb

## 2014-06-01 DIAGNOSIS — C7801 Secondary malignant neoplasm of right lung: Secondary | ICD-10-CM | POA: Diagnosis not present

## 2014-06-01 DIAGNOSIS — C20 Malignant neoplasm of rectum: Secondary | ICD-10-CM

## 2014-06-01 DIAGNOSIS — C7800 Secondary malignant neoplasm of unspecified lung: Principal | ICD-10-CM

## 2014-06-01 DIAGNOSIS — Z5112 Encounter for antineoplastic immunotherapy: Secondary | ICD-10-CM

## 2014-06-01 LAB — COMPREHENSIVE METABOLIC PANEL (CC13)
ALBUMIN: 4.4 g/dL (ref 3.5–5.0)
ALT: 17 U/L (ref 0–55)
ANION GAP: 11 meq/L (ref 3–11)
AST: 18 U/L (ref 5–34)
Alkaline Phosphatase: 48 U/L (ref 40–150)
BILIRUBIN TOTAL: 0.67 mg/dL (ref 0.20–1.20)
BUN: 12.9 mg/dL (ref 7.0–26.0)
CO2: 28 meq/L (ref 22–29)
Calcium: 10.9 mg/dL — ABNORMAL HIGH (ref 8.4–10.4)
Chloride: 103 mEq/L (ref 98–109)
Creatinine: 0.7 mg/dL (ref 0.6–1.1)
EGFR: 89 mL/min/{1.73_m2} — AB (ref >90)
GLUCOSE: 145 mg/dL — AB (ref 70–140)
POTASSIUM: 4.3 meq/L (ref 3.5–5.1)
Sodium: 142 mEq/L (ref 136–145)
TOTAL PROTEIN: 7.1 g/dL (ref 6.4–8.3)

## 2014-06-01 LAB — CBC WITH DIFFERENTIAL/PLATELET
BASO%: 1.5 % (ref 0.0–2.0)
BASOS ABS: 0.1 10*3/uL (ref 0.0–0.1)
EOS%: 3 % (ref 0.0–7.0)
Eosinophils Absolute: 0.1 10*3/uL (ref 0.0–0.5)
HEMATOCRIT: 36.5 % (ref 34.8–46.6)
HEMOGLOBIN: 11.7 g/dL (ref 11.6–15.9)
LYMPH#: 0.5 10*3/uL — AB (ref 0.9–3.3)
LYMPH%: 12.6 % — ABNORMAL LOW (ref 14.0–49.7)
MCH: 28.6 pg (ref 25.1–34.0)
MCHC: 32.1 g/dL (ref 31.5–36.0)
MCV: 89 fL (ref 79.5–101.0)
MONO#: 0.3 10*3/uL (ref 0.1–0.9)
MONO%: 7.8 % (ref 0.0–14.0)
NEUT#: 3.2 10*3/uL (ref 1.5–6.5)
NEUT%: 75.1 % (ref 38.4–76.8)
Platelets: 247 10*3/uL (ref 145–400)
RBC: 4.1 10*6/uL (ref 3.70–5.45)
RDW: 20.3 % — AB (ref 11.2–14.5)
WBC: 4.2 10*3/uL (ref 3.9–10.3)

## 2014-06-01 LAB — CEA: CEA: 2 ng/mL (ref 0.0–5.0)

## 2014-06-01 LAB — UA PROTEIN, DIPSTICK - CHCC: Protein, ur: 30 mg/dL

## 2014-06-01 MED ORDER — SODIUM CHLORIDE 0.9 % IV SOLN
Freq: Once | INTRAVENOUS | Status: AC
  Administered 2014-06-01: 09:00:00 via INTRAVENOUS

## 2014-06-01 MED ORDER — SODIUM CHLORIDE 0.9 % IJ SOLN
10.0000 mL | INTRAMUSCULAR | Status: DC | PRN
  Administered 2014-06-01: 10 mL
  Filled 2014-06-01: qty 10

## 2014-06-01 MED ORDER — HEPARIN SOD (PORK) LOCK FLUSH 100 UNIT/ML IV SOLN
500.0000 [IU] | Freq: Once | INTRAVENOUS | Status: AC | PRN
  Administered 2014-06-01: 500 [IU]
  Filled 2014-06-01: qty 5

## 2014-06-01 MED ORDER — BEVACIZUMAB CHEMO INJECTION 400 MG/16ML
7.5000 mg/kg | Freq: Once | INTRAVENOUS | Status: AC
  Administered 2014-06-01: 425 mg via INTRAVENOUS
  Filled 2014-06-01: qty 17

## 2014-06-01 MED ORDER — OXYCODONE-ACETAMINOPHEN 10-325 MG PO TABS: 1.0000 | ORAL_TABLET | ORAL | Status: DC | PRN

## 2014-06-01 NOTE — Telephone Encounter (Signed)
Gave avs & calendar for March. Sent messge to schedule treatment.

## 2014-06-01 NOTE — Patient Instructions (Signed)
[facility] Discharge Instructions for Patients Receiving Chemotherapy  Today you received the following chemotherapy agents Avastin.  To help prevent nausea and vomiting after your treatment, we encourage you to take your nausea medication as prescribed.   If you develop nausea and vomiting that is not controlled by your nausea medication, call the clinic.   BELOW ARE SYMPTOMS THAT SHOULD BE REPORTED IMMEDIATELY:  *FEVER GREATER THAN 100.5 F  *CHILLS WITH OR WITHOUT FEVER  NAUSEA AND VOMITING THAT IS NOT CONTROLLED WITH YOUR NAUSEA MEDICATION  *UNUSUAL SHORTNESS OF BREATH  *UNUSUAL BRUISING OR BLEEDING  TENDERNESS IN MOUTH AND THROAT WITH OR WITHOUT PRESENCE OF ULCERS  *URINARY PROBLEMS  *BOWEL PROBLEMS  UNUSUAL RASH Items with * indicate a potential emergency and should be followed up as soon as possible.  Feel free to call the clinic you have any questions or concerns. The clinic phone number is [phone].

## 2014-06-01 NOTE — Telephone Encounter (Signed)
Per staff message and POF I have scheduled appts. Advised scheduler of appts. [name]  

## 2014-06-01 NOTE — Progress Notes (Signed)
[name] PROGRESS NOTE   Diagnosis: Rectal cancer  INTERVAL HISTORY:   Dr. [name] returns after missing an appointment earlier this week. She reports feeling well. No mouth sores or diarrhea. No hand or foot pain. She continues every other week Xeloda and every three-week Avastin. No bleeding or symptom of thrombosis. She reports a mild nonproductive cough and "aching "at the left anterior chest.  Objective:  Vital signs in last 24 hours:  Blood pressure 161/78, pulse 80, temperature 98.3 F (36.8 C), temperature source Oral, resp. rate 18, height $RemoveBe'5\' 4"'mIuBmvlGH$  (1.626 m), weight 131 lb 4.8 oz (59.557 kg).    HEENT: No thrush or ulcers Resp: Lungs with a decrease in breath sounds at the right lower chest, no respiratory distress Cardiac: Regular rate and rhythm GI: No hepatosplenomegaly, nontender Vascular: Trace edema at the left lower leg and foot  Skin: Palms without erythema, mild hyperpigmentation   Portacath/PICC-without erythema  Lab Results:  Lab Results  Component Value Date   WBC 4.9 05/09/2014   HGB 11.8 05/09/2014   HCT 36.8 05/09/2014   MCV 89.1 05/09/2014   PLT 303 05/09/2014   NEUTROABS 3.5 05/09/2014      Lab Results  Component Value Date   CEA 2.2 12/25/2013    Medications: I have reviewed the patient's current medications.  Assessment/Plan: 1. Metastatic rectal cancer diagnosed July 2014.  Colonoscopy on 10/25/2012 showed a bulky circumferential friable mass lesion in the rectum at approximately 4 cm above the dentate line. The mass was subtotally obstructing and would not permit passage of the colonoscope proximal. Biopsy showed adenocarcinoma.   Staging CT scans 10/25/2012 showed a right lower lobe lung mass measuring 5 x 4.6 cm; 0.8 cm nodule right upper lobe; masslike rectosigmoid wall thickening, surrounding stranding and adjacent lymphadenopathy.   PET scan 11/02/2012 showed hypermetabolism in the area of apparent  circumferential rectal wall thickening; the 5 cm right lower lobe pulmonary mass was hypermetabolic with SUV max equal 8. No other hypermetabolic lung lesions were evident. Small bilateral pulmonary parenchymal nodules were noted.   She completed 5 cycles of FOLFOX chemotherapy 11/22/2012 through 02/13/2013.   Status post right thoracotomy, bilobectomy (right lower and middle lobectomy) with en bloc resection of the diaphragm 02/27/2013. Pathology showed adenocarcinoma consistent with metastatic colorectal adenocarcinoma, 4.3 cm, negative margins, no lymph node involvement. Negative soft tissue biopsy of the diaphragm x2. Positive for K-ras mutation-codon 13   She completed concurrent radiation and Xeloda for treatment of the rectal tumor 03/06/2013 through 04/11/2013.   Status post low anterior resection with a diverting loop ileostomy on 05/31/2013 at [facility] in [place]. Final pathology showed moderately differentiated adenocarcinoma located in the upper rectum; the tumor measured 2.5 x 2.2 cm; there was invasion through the muscularis propria into pericolic adipose tissue; tumor budding was present. Lymphatic status negative; vascular status negative; proximal and distal surgical margins negative; 2 of 17 lymph nodes showed metastatic adenocarcinoma. Mesenteric tumor deposits were present. Perineural invasion present. [facility] confirmed an additional positive lymph node and the tumor was staged as a T4a   CT abdomen/pelvis 05/12/2013. Metastatic nodules in the lung increased. Chronic loculated right base pneumothorax. Diffuse mucosal thickening of the sigmoid portion of the colon.   Restaging CTs 07/25/2013 with enlargement of bilateral lung nodules, no other evidence of metastatic disease   Ileostomy takedown 08/14/2013   Chest CT 10/02/2013 revealed enlargement of bilateral lung nodules and increased right pleural fluid   Cycle 1  of FOLFIRI/Avastin 10/03/2013    Cycle 6 of FOLFIRI/Avastin 12/12/2013   Restaging CT 12/22/2013 with a decrease in the size of bilateral lung nodules, no evidence of disease progression   Cycle 7 FOLFIRI/Avastin 12/25/2013  Cycle 8 FOLFIRI/Avastin 01/10/2014  Cycle 9 FOLFIRI/Avastin 01/24/2014  Cycle 10 FOLFIRI/Avastin 02/07/2014  Cycle of 11 FOLFIRI/Avastin 02/21/2014  Restaging CT 03/05/2014 with a decrease in the size of 2 dominant right lung nodules, no new nodules, new pleural gas collection at the right apex  Cycle 12 FOLFIRI/Avastin 03/07/2014  Cycle 13 FOLFIRI/Avastin 03/20/2014  Cycle 14 FOLFIRI/Avastin 04/04/2014  Initiation of maintenance Xeloda/Avastin 04/18/2014 2. Status post right VATS, drainage of pleural effusion, visceral and parietal pleural decortication 12/21/2012. Pleural fluid culture positive for viridans Streptococcus. Right pleura biopsies negative for malignancy. Findings favored empyema. 3. Diabetes. 4. Frequent bowel movements, diminished pelvic muscle tone-we made a referral to the pelvic physical therapy clinic 5. Pain in the left leg with tenderness at the left calf and left ischium-MRI of the lumbar spine and pelvis on 10/13/2011 with no evidence of metastatic disease. A right iliac bone "lesion" was felt to most likely represent heterogenous marrow. Edema and enhancement was noted at the left sciatic nerve compared to the right side with no mass lesion 6. Hypertension-maintained on amlodipine 7. Left foot fracture 8. BRIP1 mutation    Disposition:  She appears stable. The plan is to continue Xeloda/Avastin. She will return for an office visit and Avastin on 06/20/2014. Dr. [name] will be scheduled for a restaging chest CT after the 06/20/2014 visit.  Betsy Coder, MD  06/01/2014  8:36 AM

## 2014-06-04 ENCOUNTER — Other Ambulatory Visit: Payer: Self-pay | Admitting: Internal Medicine

## 2014-06-11 ENCOUNTER — Other Ambulatory Visit: Payer: Self-pay | Admitting: *Deleted

## 2014-06-11 ENCOUNTER — Telehealth: Payer: Self-pay | Admitting: *Deleted

## 2014-06-11 MED ORDER — CAPECITABINE 500 MG PO TABS: ORAL_TABLET | ORAL | Status: DC

## 2014-06-11 NOTE — Telephone Encounter (Signed)
Patient called to say she is due to restart Xeloda tomorrow, but has no refills on the Rx and no Xeloda left.  Please refill at [facility].

## 2014-06-13 ENCOUNTER — Other Ambulatory Visit: Payer: Self-pay | Admitting: Dermatology

## 2014-06-20 ENCOUNTER — Ambulatory Visit (HOSPITAL_BASED_OUTPATIENT_CLINIC_OR_DEPARTMENT_OTHER): Payer: 59

## 2014-06-20 ENCOUNTER — Telehealth: Payer: Self-pay | Admitting: *Deleted

## 2014-06-20 ENCOUNTER — Ambulatory Visit (HOSPITAL_BASED_OUTPATIENT_CLINIC_OR_DEPARTMENT_OTHER): Payer: 59 | Admitting: Oncology

## 2014-06-20 ENCOUNTER — Telehealth: Payer: Self-pay | Admitting: Oncology

## 2014-06-20 ENCOUNTER — Other Ambulatory Visit (HOSPITAL_BASED_OUTPATIENT_CLINIC_OR_DEPARTMENT_OTHER): Payer: 59

## 2014-06-20 VITALS — BP 148/71 | HR 80 | Temp 98.1°F | Resp 18 | Ht 64.0 in | Wt 129.4 lb

## 2014-06-20 DIAGNOSIS — C7801 Secondary malignant neoplasm of right lung: Secondary | ICD-10-CM

## 2014-06-20 DIAGNOSIS — C20 Malignant neoplasm of rectum: Secondary | ICD-10-CM

## 2014-06-20 DIAGNOSIS — C7800 Secondary malignant neoplasm of unspecified lung: Principal | ICD-10-CM

## 2014-06-20 DIAGNOSIS — Z5112 Encounter for antineoplastic immunotherapy: Secondary | ICD-10-CM | POA: Diagnosis not present

## 2014-06-20 DIAGNOSIS — I10 Essential (primary) hypertension: Secondary | ICD-10-CM | POA: Diagnosis not present

## 2014-06-20 DIAGNOSIS — E119 Type 2 diabetes mellitus without complications: Secondary | ICD-10-CM | POA: Diagnosis not present

## 2014-06-20 LAB — COMPREHENSIVE METABOLIC PANEL (CC13)
ALBUMIN: 4.4 g/dL (ref 3.5–5.0)
ALK PHOS: 47 U/L (ref 40–150)
ALT: 32 U/L (ref 0–55)
AST: 25 U/L (ref 5–34)
Anion Gap: 10 mEq/L (ref 3–11)
BILIRUBIN TOTAL: 0.7 mg/dL (ref 0.20–1.20)
BUN: 13 mg/dL (ref 7.0–26.0)
CO2: 28 mEq/L (ref 22–29)
Calcium: 10.6 mg/dL — ABNORMAL HIGH (ref 8.4–10.4)
Chloride: 103 mEq/L (ref 98–109)
Creatinine: 0.7 mg/dL (ref 0.6–1.1)
EGFR: 88 mL/min/{1.73_m2} — ABNORMAL LOW (ref >90)
GLUCOSE: 136 mg/dL (ref 70–140)
POTASSIUM: 4.2 meq/L (ref 3.5–5.1)
Sodium: 141 mEq/L (ref 136–145)
Total Protein: 7.1 g/dL (ref 6.4–8.3)

## 2014-06-20 LAB — CBC WITH DIFFERENTIAL/PLATELET
BASO%: 1.3 % (ref 0.0–2.0)
BASOS ABS: 0.1 10*3/uL (ref 0.0–0.1)
EOS ABS: 0.1 10*3/uL (ref 0.0–0.5)
EOS%: 3.5 % (ref 0.0–7.0)
HCT: 36 % (ref 34.8–46.6)
HEMOGLOBIN: 11.6 g/dL (ref 11.6–15.9)
LYMPH#: 0.8 10*3/uL — AB (ref 0.9–3.3)
LYMPH%: 20.8 % (ref 14.0–49.7)
MCH: 28.2 pg (ref 25.1–34.0)
MCHC: 32.1 g/dL (ref 31.5–36.0)
MCV: 87.9 fL (ref 79.5–101.0)
MONO#: 0.4 10*3/uL (ref 0.1–0.9)
MONO%: 9.1 % (ref 0.0–14.0)
NEUT%: 65.3 % (ref 38.4–76.8)
NEUTROS ABS: 2.5 10*3/uL (ref 1.5–6.5)
Platelets: 270 10*3/uL (ref 145–400)
RBC: 4.1 10*6/uL (ref 3.70–5.45)
RDW: 22.2 % — ABNORMAL HIGH (ref 11.2–14.5)
WBC: 3.9 10*3/uL (ref 3.9–10.3)

## 2014-06-20 MED ORDER — HEPARIN SOD (PORK) LOCK FLUSH 100 UNIT/ML IV SOLN
500.0000 [IU] | Freq: Once | INTRAVENOUS | Status: AC
  Administered 2014-06-20: 500 [IU] via INTRAVENOUS
  Filled 2014-06-20: qty 5

## 2014-06-20 MED ORDER — SODIUM CHLORIDE 0.9 % IV SOLN
7.5000 mg/kg | Freq: Once | INTRAVENOUS | Status: AC
  Administered 2014-06-20: 425 mg via INTRAVENOUS
  Filled 2014-06-20: qty 17

## 2014-06-20 MED ORDER — OXYCODONE-ACETAMINOPHEN 10-325 MG PO TABS: 1.0000 | ORAL_TABLET | ORAL | Status: DC | PRN

## 2014-06-20 MED ORDER — SODIUM CHLORIDE 0.9 % IJ SOLN
10.0000 mL | INTRAMUSCULAR | Status: DC | PRN
  Administered 2014-06-20: 10 mL via INTRAVENOUS
  Filled 2014-06-20: qty 10

## 2014-06-20 MED ORDER — SODIUM CHLORIDE 0.9 % IV SOLN
Freq: Once | INTRAVENOUS | Status: AC
  Administered 2014-06-20: 10:00:00 via INTRAVENOUS

## 2014-06-20 NOTE — Telephone Encounter (Signed)
Per staff message and POF I have scheduled appts. Advised scheduler of appts. [name]  

## 2014-06-20 NOTE — Patient Instructions (Signed)
[facility] Discharge Instructions for Patients Receiving Chemotherapy  Today you received the following chemotherapy agents avastin  To help prevent nausea and vomiting after your treatment, we encourage you to take your nausea medication as directed   If you develop nausea and vomiting that is not controlled by your nausea medication, call the clinic.   BELOW ARE SYMPTOMS THAT SHOULD BE REPORTED IMMEDIATELY:  *FEVER GREATER THAN 100.5 F  *CHILLS WITH OR WITHOUT FEVER  NAUSEA AND VOMITING THAT IS NOT CONTROLLED WITH YOUR NAUSEA MEDICATION  *UNUSUAL SHORTNESS OF BREATH  *UNUSUAL BRUISING OR BLEEDING  TENDERNESS IN MOUTH AND THROAT WITH OR WITHOUT PRESENCE OF ULCERS  *URINARY PROBLEMS  *BOWEL PROBLEMS  UNUSUAL RASH Items with * indicate a potential emergency and should be followed up as soon as possible.  Feel free to call the clinic you have any questions or concerns. The clinic phone number is [phone].

## 2014-06-20 NOTE — Telephone Encounter (Addendum)
Pt confirmed labs/ov per 03/23 POF, gave pt AVS and Calendar.Cherylann Banas, sent msg to add chemo

## 2014-06-20 NOTE — Progress Notes (Signed)
[facility] OFFICE PROGRESS NOTE   Diagnosis: Rectal cancer  INTERVAL HISTORY:   Dr. [name] returns as scheduled. She continues every other week Xeloda. No mouth sores, diarrhea, or hand/foot pain. She has intermittent discomfort at the left anterior chest. No dyspnea. She complains of malaise after taking Xeloda.   Objective:  Vital signs in last 24 hours:  Blood pressure 148/71, pulse 80, temperature 98.1 F (36.7 C), temperature source Oral, resp. rate 18, height $RemoveBe'5\' 4"'IRACZJhYu$  (1.626 m), weight 129 lb 6.4 oz (58.695 kg).    HEENT: No thrush or ulcers Resp: Lungs clear bilaterally Cardio: Regular rate and rhythm GI: No hepatomegaly, nontender Vascular: Trace edema at the left lower leg and foot  Skin: Palms without erythema   Portacath/PICC-without erythema  Lab Results:  Lab Results  Component Value Date   WBC 3.9 06/20/2014   HGB 11.6 06/20/2014   HCT 36.0 06/20/2014   MCV 87.9 06/20/2014   PLT 270 06/20/2014   NEUTROABS 2.5 06/20/2014     Lab Results  Component Value Date   CEA 2.0 06/01/2014    Imaging:  No results found.  Medications: I have reviewed the patient's current medications.  Assessment/Plan: 1. Metastatic rectal cancer diagnosed July 2014.  Colonoscopy on 10/25/2012 showed a bulky circumferential friable mass lesion in the rectum at approximately 4 cm above the dentate line. The mass was subtotally obstructing and would not permit passage of the colonoscope proximal. Biopsy showed adenocarcinoma.   Staging CT scans 10/25/2012 showed a right lower lobe lung mass measuring 5 x 4.6 cm; 0.8 cm nodule right upper lobe; masslike rectosigmoid wall thickening, surrounding stranding and adjacent lymphadenopathy.   PET scan 11/02/2012 showed hypermetabolism in the area of apparent circumferential rectal wall thickening; the 5 cm right lower lobe pulmonary mass was hypermetabolic with SUV max equal 8. No other hypermetabolic lung lesions  were evident. Small bilateral pulmonary parenchymal nodules were noted.   She completed 5 cycles of FOLFOX chemotherapy 11/22/2012 through 02/13/2013.   Status post right thoracotomy, bilobectomy (right lower and middle lobectomy) with en bloc resection of the diaphragm 02/27/2013. Pathology showed adenocarcinoma consistent with metastatic colorectal adenocarcinoma, 4.3 cm, negative margins, no lymph node involvement. Negative soft tissue biopsy of the diaphragm x2. Positive for K-ras mutation-codon 13   She completed concurrent radiation and Xeloda for treatment of the rectal tumor 03/06/2013 through 04/11/2013.   Status post low anterior resection with a diverting loop ileostomy on 05/31/2013 at [facility] in [place]. Final pathology showed moderately differentiated adenocarcinoma located in the upper rectum; the tumor measured 2.5 x 2.2 cm; there was invasion through the muscularis propria into pericolic adipose tissue; tumor budding was present. Lymphatic status negative; vascular status negative; proximal and distal surgical margins negative; 2 of 17 lymph nodes showed metastatic adenocarcinoma. Mesenteric tumor deposits were present. Perineural invasion present. [facility] confirmed an additional positive [facility] node and the tumor was staged as a T4a   CT abdomen/pelvis 05/12/2013. Metastatic nodules in the lung increased. Chronic loculated right base pneumothorax. Diffuse mucosal thickening of the sigmoid portion of the colon.   Restaging CTs 07/25/2013 with enlargement of bilateral lung nodules, no other evidence of metastatic disease   Ileostomy takedown 08/14/2013   Chest CT 10/02/2013 revealed enlargement of bilateral lung nodules and increased right pleural fluid   Cycle 1 of FOLFIRI/Avastin 10/03/2013   Cycle 6 of FOLFIRI/Avastin 12/12/2013   Restaging CT 12/22/2013 with a decrease in the size of bilateral lung nodules,  no evidence of disease progression    Cycle 7 FOLFIRI/Avastin 12/25/2013  Cycle 8 FOLFIRI/Avastin 01/10/2014  Cycle 9 FOLFIRI/Avastin 01/24/2014  Cycle 10 FOLFIRI/Avastin 02/07/2014  Cycle of 11 FOLFIRI/Avastin 02/21/2014  Restaging CT 03/05/2014 with a decrease in the size of 2 dominant right lung nodules, no new nodules, new pleural gas collection at the right apex  Cycle 12 FOLFIRI/Avastin 03/07/2014  Cycle 13 FOLFIRI/Avastin 03/20/2014  Cycle 14 FOLFIRI/Avastin 04/04/2014  Initiation of maintenance Xeloda/Avastin 04/18/2014 2. Status post right VATS, drainage of pleural effusion, visceral and parietal pleural decortication 12/21/2012. Pleural fluid culture positive for viridans Streptococcus. Right pleura biopsies negative for malignancy. Findings favored empyema. 3. Diabetes. 4. Frequent bowel movements, diminished pelvic muscle tone-we made a referral to the pelvic physical therapy clinic 5. Pain in the left leg with tenderness at the left calf and left ischium-MRI of the lumbar spine and pelvis on 10/13/2011 with no evidence of metastatic disease. A right iliac bone "lesion" was felt to most likely represent heterogenous marrow. Edema and enhancement was noted at the left sciatic nerve compared to the right side with no mass lesion 6. Hypertension-maintained on amlodipine 7. Left foot fracture 8. BRIP1 mutation  Disposition:  She appears stable. She will complete another treatment with Avastin today. Dr. [name] will continue every other week Xeloda. She will be scheduled for a restaging CT prior to office visit 07/11/2014.  Betsy Coder, MD  06/20/2014  12:50 PM

## 2014-06-25 ENCOUNTER — Telehealth: Payer: Self-pay | Admitting: *Deleted

## 2014-06-25 NOTE — Telephone Encounter (Signed)
Pt called requesting CT appt per MD last office visit.  Per Radiology scheduling--- called and left voice message on pt cell# that appt has been scheduled for 07/10/14 arrive at [facility] 7:45am; if any questions or time does not work to re-schedule by calling radiology.

## 2014-07-02 ENCOUNTER — Other Ambulatory Visit: Payer: Self-pay | Admitting: *Deleted

## 2014-07-02 ENCOUNTER — Telehealth: Payer: Self-pay | Admitting: Oncology

## 2014-07-02 MED ORDER — CAPECITABINE 500 MG PO TABS: ORAL_TABLET | ORAL | Status: DC

## 2014-07-02 NOTE — Telephone Encounter (Signed)
S/w pt confirming labs moved to day of CT and confirmed with Dr. Benay Spice, also confirmed MD visit r/s with chemo after pt's other MD visit outside of the [place].... Also sent a msg to MD/Desk nurse pt is requesting refill on capecitabine (XELODA) 500 MG tablet, if MD wants her to go back on it she will run out by her next visit..... KJ

## 2014-07-02 NOTE — Telephone Encounter (Signed)
Ok to refill xeloda if she does not have enough to last until 4/12. She will have CT on 4/11 to decide on need for xeloda beyond 4/12

## 2014-07-03 NOTE — Telephone Encounter (Signed)
Called pt to clarify whether she has enough Xeloda to get through the current cycle. She is off this week, due to resume Xeloda on 4/12. Instructed her to wait until office visit to pick up Xeloda, in case regimen changes after restaging scans. She voiced understanding.

## 2014-07-08 ENCOUNTER — Other Ambulatory Visit: Payer: Self-pay | Admitting: Oncology

## 2014-07-09 ENCOUNTER — Encounter (HOSPITAL_COMMUNITY): Payer: Self-pay

## 2014-07-09 ENCOUNTER — Other Ambulatory Visit (HOSPITAL_BASED_OUTPATIENT_CLINIC_OR_DEPARTMENT_OTHER): Payer: 59

## 2014-07-09 ENCOUNTER — Ambulatory Visit (HOSPITAL_COMMUNITY)
Admission: RE | Admit: 2014-07-09 | Discharge: 2014-07-09 | Disposition: A | Discharge status: Home / Self Care | Payer: 59 | Source: Ambulatory Visit | Attending: Oncology | Admitting: Oncology

## 2014-07-09 DIAGNOSIS — C7801 Secondary malignant neoplasm of right lung: Secondary | ICD-10-CM | POA: Insufficient documentation

## 2014-07-09 DIAGNOSIS — Z08 Encounter for follow-up examination after completed treatment for malignant neoplasm: Secondary | ICD-10-CM | POA: Diagnosis not present

## 2014-07-09 DIAGNOSIS — Z923 Personal history of irradiation: Secondary | ICD-10-CM | POA: Insufficient documentation

## 2014-07-09 DIAGNOSIS — C20 Malignant neoplasm of rectum: Secondary | ICD-10-CM | POA: Insufficient documentation

## 2014-07-09 DIAGNOSIS — C7802 Secondary malignant neoplasm of left lung: Secondary | ICD-10-CM | POA: Insufficient documentation

## 2014-07-09 DIAGNOSIS — Z9221 Personal history of antineoplastic chemotherapy: Secondary | ICD-10-CM | POA: Diagnosis not present

## 2014-07-09 DIAGNOSIS — Z79899 Other long term (current) drug therapy: Secondary | ICD-10-CM | POA: Insufficient documentation

## 2014-07-09 DIAGNOSIS — Z902 Acquired absence of lung [part of]: Secondary | ICD-10-CM | POA: Insufficient documentation

## 2014-07-09 DIAGNOSIS — C7800 Secondary malignant neoplasm of unspecified lung: Secondary | ICD-10-CM

## 2014-07-09 LAB — COMPREHENSIVE METABOLIC PANEL (CC13)
ALBUMIN: 4.5 g/dL (ref 3.5–5.0)
ALK PHOS: 44 U/L (ref 40–150)
ALT: 20 U/L (ref 0–55)
AST: 18 U/L (ref 5–34)
Anion Gap: 9 mEq/L (ref 3–11)
BUN: 11.8 mg/dL (ref 7.0–26.0)
CHLORIDE: 103 meq/L (ref 98–109)
CO2: 28 mEq/L (ref 22–29)
Calcium: 10.4 mg/dL (ref 8.4–10.4)
Creatinine: 0.7 mg/dL (ref 0.6–1.1)
EGFR: 88 mL/min/{1.73_m2} — ABNORMAL LOW (ref >90)
Glucose: 136 mg/dl (ref 70–140)
POTASSIUM: 4.1 meq/L (ref 3.5–5.1)
Sodium: 141 mEq/L (ref 136–145)
Total Bilirubin: 0.43 mg/dL (ref 0.20–1.20)
Total Protein: 7.2 g/dL (ref 6.4–8.3)

## 2014-07-09 LAB — CBC WITH DIFFERENTIAL/PLATELET
BASO%: 1.2 % (ref 0.0–2.0)
Basophils Absolute: 0.1 10*3/uL (ref 0.0–0.1)
EOS%: 2.4 % (ref 0.0–7.0)
Eosinophils Absolute: 0.1 10*3/uL (ref 0.0–0.5)
HCT: 38 % (ref 34.8–46.6)
HGB: 12.1 g/dL (ref 11.6–15.9)
LYMPH#: 0.9 10*3/uL (ref 0.9–3.3)
LYMPH%: 20.4 % (ref 14.0–49.7)
MCH: 28.4 pg (ref 25.1–34.0)
MCHC: 31.9 g/dL (ref 31.5–36.0)
MCV: 89 fL (ref 79.5–101.0)
MONO#: 0.5 10*3/uL (ref 0.1–0.9)
MONO%: 10.8 % (ref 0.0–14.0)
NEUT#: 2.8 10*3/uL (ref 1.5–6.5)
NEUT%: 65.2 % (ref 38.4–76.8)
Platelets: 262 10*3/uL (ref 145–400)
RBC: 4.26 10*6/uL (ref 3.70–5.45)
RDW: 25.6 % — ABNORMAL HIGH (ref 11.2–14.5)
WBC: 4.4 10*3/uL (ref 3.9–10.3)

## 2014-07-09 LAB — UA PROTEIN, DIPSTICK - CHCC: PROTEIN: NEGATIVE mg/dL

## 2014-07-09 MED ORDER — IOHEXOL 300 MG/ML  SOLN
80.0000 mL | Freq: Once | INTRAMUSCULAR | Status: AC | PRN
  Administered 2014-07-09: 80 mL via INTRAVENOUS

## 2014-07-10 ENCOUNTER — Ambulatory Visit (HOSPITAL_BASED_OUTPATIENT_CLINIC_OR_DEPARTMENT_OTHER): Payer: 59 | Admitting: Oncology

## 2014-07-10 ENCOUNTER — Ambulatory Visit (HOSPITAL_COMMUNITY): Payer: 59

## 2014-07-10 ENCOUNTER — Other Ambulatory Visit: Payer: 59

## 2014-07-10 ENCOUNTER — Ambulatory Visit (HOSPITAL_BASED_OUTPATIENT_CLINIC_OR_DEPARTMENT_OTHER): Payer: 59

## 2014-07-10 ENCOUNTER — Ambulatory Visit (INDEPENDENT_AMBULATORY_CARE_PROVIDER_SITE_OTHER): Payer: 59 | Admitting: Thoracic Surgery (Cardiothoracic Vascular Surgery)

## 2014-07-10 ENCOUNTER — Encounter: Payer: Self-pay | Admitting: Thoracic Surgery (Cardiothoracic Vascular Surgery)

## 2014-07-10 VITALS — BP 140/64 | HR 92 | Resp 20 | Ht 64.0 in | Wt 130.0 lb

## 2014-07-10 VITALS — BP 151/80 | HR 86 | Temp 98.1°F | Ht 64.0 in | Wt 131.7 lb

## 2014-07-10 DIAGNOSIS — C7802 Secondary malignant neoplasm of left lung: Secondary | ICD-10-CM

## 2014-07-10 DIAGNOSIS — C20 Malignant neoplasm of rectum: Secondary | ICD-10-CM

## 2014-07-10 DIAGNOSIS — Z5112 Encounter for antineoplastic immunotherapy: Secondary | ICD-10-CM | POA: Diagnosis not present

## 2014-07-10 DIAGNOSIS — C7800 Secondary malignant neoplasm of unspecified lung: Principal | ICD-10-CM

## 2014-07-10 DIAGNOSIS — C7801 Secondary malignant neoplasm of right lung: Secondary | ICD-10-CM

## 2014-07-10 DIAGNOSIS — Z902 Acquired absence of lung [part of]: Secondary | ICD-10-CM

## 2014-07-10 DIAGNOSIS — Z9889 Other specified postprocedural states: Secondary | ICD-10-CM

## 2014-07-10 MED ORDER — SODIUM CHLORIDE 0.9 % IJ SOLN
10.0000 mL | INTRAMUSCULAR | Status: DC | PRN
Start: 2014-07-10 — End: 2014-07-10
  Administered 2014-07-10: 10 mL
  Filled 2014-07-10: qty 10

## 2014-07-10 MED ORDER — SODIUM CHLORIDE 0.9 % IV SOLN
5.0000 mg/kg | Freq: Once | INTRAVENOUS | Status: AC
  Administered 2014-07-10: 300 mg via INTRAVENOUS
  Filled 2014-07-10: qty 12

## 2014-07-10 MED ORDER — SODIUM CHLORIDE 0.9 % IV SOLN
Freq: Once | INTRAVENOUS | Status: AC
  Administered 2014-07-10: 11:00:00 via INTRAVENOUS

## 2014-07-10 MED ORDER — HEPARIN SOD (PORK) LOCK FLUSH 100 UNIT/ML IV SOLN
500.0000 [IU] | Freq: Once | INTRAVENOUS | Status: AC | PRN
  Administered 2014-07-10: 500 [IU]
  Filled 2014-07-10: qty 5

## 2014-07-10 NOTE — Progress Notes (Signed)
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 She initially had an isolated lung metastasis. She initially was treated with chemotherapy, but developed a an empyema. We drained the empyema and ultimately resected the lung metastasis. This required an en bloc resection of a portion of the diaphragm. She subsequently had evidence of early recurrence with multiple lung nodules.   She completed a 12 cycle chemotherapy regimen with FOLFIRI/Avastin. She then was treated with an oral agent, Xeloda, which she tolerated well. She saw Dr. Benay Spice this morning. Her repeat CT showed minimal enlargement of lung nodules. She is being started back on the FOLFIRI/Avastin combination.  She feels well. She says her weight is stable. With diet modifications and control of fluid intake she has improved her bowel function. She's not had any respiratory difficulties.  Past Medical History  Diagnosis Date  . Postmenopausal HRT (hormone replacement therapy) 07/01/2011    surgical menopause  . Hx of hyperglycemia 07/01/2011  . Hx of compression fracture of spine 1985    t12 after a  20 foot fall  . Hx of fracture of wrist     after fall   . 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 Hx of colonic polyps   . Early cataract     stoneburner   . High frequency hearing loss     [facility] followed   . Diabetes mellitus without complication   . Complication of anesthesia     pt hadpanic attacks with versed, no versed  . Status post chemotherapy     completed a total of three cycles of Folfox   . Colon cancer     rectal cancer with lung metastasis  . Skin cancer     multiple  . Heart murmur, systolic 0/04/1113    innocent per Dr. Gershon Mussel   . Asthma     as a child none since 36 years old  . Headache(784.0)   . Family history of malignant  neoplasm of breast   . Family history of malignant neoplasm of ovary   . 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 Fracture of left foot     2015 trip      Current Outpatient Prescriptions  Medication Sig Dispense Refill  . ACCU-CHEK AVIVA PLUS test strip CHECK BLOOD GLUCOSE (SUGAR) TWICE DAILY 100 each 6  . amLODipine (NORVASC) 10 MG tablet Take 1 tablet (10 mg total) by mouth daily. 90 tablet 3  . Blood Glucose Monitoring Suppl (ACCU-CHEK AVIVA PLUS) W/DEVICE KIT AS DIRECTED 1 kit 0  . Calcium Carbonate (CALCIUM 600 PO) Take 1 tablet by mouth daily.     . capecitabine (XELODA) 500 MG tablet Take 3 tablets PO (1,500 mg) QAM 2 tabs (1,000 mg) QPM on Days 1-7 and Day 15-21 70 tablet 0  . estradiol (MINIVELLE) 0.0375 MG/24HR Place 1 patch onto the skin 2 (two) times a week. Sundays and Thursdays    . JANUVIA 100 MG tablet TAKE 1 TABLET BY MOUTH ONCE DAILY 30 tablet 2  .  metFORMIN (GLUCOPHAGE) 1000 MG tablet TAKE ONE TABLET TWICE DAILY WITH A MEAL 180 tablet 0  . methylTESTOSTERone (ANDROID) 10 MG capsule Take 10 mg by mouth.    . Multiple Vitamin (MULTIVITAMIN) tablet Take 1 tablet by mouth daily. Senior    . OVER THE COUNTER MEDICATION Place 1 drop into both ears 2 (two) times a week. Ear drops to decrease wax build up    . oxyCODONE-acetaminophen (PERCOCET) 10-325 MG per tablet Take 1 tablet by mouth every 4 (four) hours as needed for pain. 60 tablet 0  . psyllium (METAMUCIL) 58.6 % powder Take 1 packet by mouth daily.     No current facility-administered medications for this visit.    Physical Exam BP 140/64 mmHg  Pulse 92  Resp 20  Ht _0  (1.626 m)  Wt 130 lb (58.968 kg)  BMI 22.30 kg/m2  SpO53 67% 69 year old woman in no acute distress Well-developed and well-nourished Alert and oriented 3 with no focal deficits No cervical or  supraclavicular adenopathy Cardiac regular rate and rhythm normal S1 and S2 Lungs with diminished breath sounds right base, otherwise clear  Diagnostic Tests: CT of the chest reviewed there has been minimal enlargement of bilateral lung nodules.  Impression: 69 year old woman with stage IV rectal cancer. She had a lobectomy with en bloc resection of the diaphragm back in 2014. She currently is being treated with chemotherapy. Her CT scan yesterday did show minimal enlargement of lung nodules although slight variation in the area of the cut and some inter-observer variability could be affecting that measurement. In any event, even if enlarged, the change has been minimal.  She talked again about focused radiation. She is not wanting to have that at this time due to concerns about damage to surrounding lung.   Plan: She will continue to be with Dr. Kavin Leech  I will see her back in about 3 months with a PA and lateral chest x-ray to check on her progress  Melrose Nakayama, MD [facility] [phone]

## 2014-07-10 NOTE — Patient Instructions (Signed)
[facility] Discharge Instructions for Patients Receiving Chemotherapy  Today you received the following chemotherapy agent: Avastin   To help prevent nausea and vomiting after your treatment, we encourage you to take your nausea medication as prescribed.    If you develop nausea and vomiting that is not controlled by your nausea medication, call the clinic.   BELOW ARE SYMPTOMS THAT SHOULD BE REPORTED IMMEDIATELY:  *FEVER GREATER THAN 100.5 F  *CHILLS WITH OR WITHOUT FEVER  NAUSEA AND VOMITING THAT IS NOT CONTROLLED WITH YOUR NAUSEA MEDICATION  *UNUSUAL SHORTNESS OF BREATH  *UNUSUAL BRUISING OR BLEEDING  TENDERNESS IN MOUTH AND THROAT WITH OR WITHOUT PRESENCE OF ULCERS  *URINARY PROBLEMS  *BOWEL PROBLEMS  UNUSUAL RASH Items with * indicate a potential emergency and should be followed up as soon as possible.  Feel free to call the clinic you have any questions or concerns. The clinic phone number is [phone].  Please show the [place] at check-in to the Emergency Department and triage nurse.

## 2014-07-10 NOTE — Progress Notes (Signed)
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 She feels well. No mouth sores or diarrhea. No bleeding or symptom of thrombosis. She is working.  Objective:  Vital signs in last 24 hours:  Blood pressure 151/80, pulse 86, temperature 98.1 F (36.7 C), temperature source Oral, height $RemoveBefo'5\' 4"'yoCCqpgNqpS$  (1.626 m), weight 131 lb 11.2 oz (59.739 kg), SpO2 100 %.    HEENT: No thrush or ulcers Lymphatics: No cervical, supraclavicular, or axillary nodes Resp: Decreased breath sounds at the right lower chest, no respiratory distress Cardio: Regular rate and rhythm GI: No hepatomegaly, nontender Vascular: Trace edema at the left foot  Skin: Mild hyperpigmentation of the palms   Portacath/PICC-without erythema  Lab Results:  Lab Results  Component Value Date   WBC 4.4 07/09/2014   HGB 12.1 07/09/2014   HCT 38.0 07/09/2014   MCV 89.0 07/09/2014   PLT 262 07/09/2014   NEUTROABS 2.8 07/09/2014      Lab Results  Component Value Date   CEA 2.0 06/01/2014    Imaging:  Ct Chest W Contrast  07/09/2014   CLINICAL DATA:  Followup metastatic rectal carcinoma to lung. Previous surgery, radiation therapy and chemotherapy. On going oral chemotherapy. Restaging.  EXAM: CT CHEST WITH CONTRAST  TECHNIQUE: Multidetector CT imaging of the chest was performed during intravenous contrast administration.  CONTRAST:  29mL OMNIPAQUE IOHEXOL 300 MG/ML  SOLN  COMPARISON:  03/05/2014  FINDINGS: Mediastinum/Lymph Nodes: No masses or pathologically enlarged lymph nodes identified.  Lungs/Pleura: Postop changes from prior right lower lobectomy are stable. Bilateral pulmonary metastases show mild enlargement since previous study. Two index nodules in the right lung measure 1.3 x 1.6 cm on image 18 compared to 10 x 10 mm previously, and 1.3 x 1.9 cm on image 28 compared to 1.1 x 1.4 cm previously. Index nodule in the left lower lobe  measures 9 mm on image 34 compared to 6 mm previously.  Right apical scarring with probable tiny subpleural bleb is unchanged in appearance.  Musculoskeletal/Soft Tissues: No suspicious bone lesions or other significant chest wall abnormality. Old lower thoracic spine vertebral body compression fracture appears stable.  Upper Abdomen:  Unremarkable.  IMPRESSION: Mild interval progression of bilateral pulmonary metastases compared to prior study.  No significant change in appearance of right apical scarring and probable small subpleural bleb.   Electronically Signed   By: Earle Gell M.D.   On: 07/09/2014 15:17    Medications: I have reviewed the patient's current medications.  Assessment/Plan: 1. Metastatic rectal cancer diagnosed July 2014.  Colonoscopy on 10/25/2012 showed a bulky circumferential friable mass lesion in the rectum at approximately 4 cm above the dentate line. The mass was subtotally obstructing and would not permit passage of the colonoscope proximal. Biopsy showed adenocarcinoma.   Staging CT scans 10/25/2012 showed a right lower lobe lung mass measuring 5 x 4.6 cm; 0.8 cm nodule right upper lobe; masslike rectosigmoid wall thickening, surrounding stranding and adjacent lymphadenopathy.   PET scan 11/02/2012 showed hypermetabolism in the area of apparent circumferential rectal wall thickening; the 5 cm right lower lobe pulmonary mass was hypermetabolic with SUV max equal 8. No other hypermetabolic lung lesions were evident. Small bilateral pulmonary parenchymal nodules were noted.   She completed 5 cycles of FOLFOX chemotherapy 11/22/2012 through 02/13/2013.   Status post right thoracotomy, bilobectomy (right lower and middle lobectomy) with en bloc resection of the diaphragm 02/27/2013. Pathology showed adenocarcinoma consistent  with metastatic colorectal adenocarcinoma, 4.3 cm, negative margins, no lymph node involvement. Negative soft tissue biopsy of the diaphragm x2.  Positive for K-ras mutation-codon 13   She completed concurrent radiation and Xeloda for treatment of the rectal tumor 03/06/2013 through 04/11/2013.   Status post low anterior resection with a diverting loop ileostomy on 05/31/2013 at [facility] in [place]. Final pathology showed moderately differentiated adenocarcinoma located in the upper rectum; the tumor measured 2.5 x 2.2 cm; there was invasion through the muscularis propria into pericolic adipose tissue; tumor budding was present. Lymphatic status negative; vascular status negative; proximal and distal surgical margins negative; 2 of 17 lymph nodes showed metastatic adenocarcinoma. Mesenteric tumor deposits were present. Perineural invasion present. [facility] confirmed an additional positive [facility] node and the tumor was staged as a T4a   CT abdomen/pelvis 05/12/2013. Metastatic nodules in the lung increased. Chronic loculated right base pneumothorax. Diffuse mucosal thickening of the sigmoid portion of the colon.   Restaging CTs 07/25/2013 with enlargement of bilateral lung nodules, no other evidence of metastatic disease   Ileostomy takedown 08/14/2013   Chest CT 10/02/2013 revealed enlargement of bilateral lung nodules and increased right pleural fluid   Cycle 1 of FOLFIRI/Avastin 10/03/2013   Cycle 6 of FOLFIRI/Avastin 12/12/2013   Restaging CT 12/22/2013 with a decrease in the size of bilateral lung nodules, no evidence of disease progression   Cycle 7 FOLFIRI/Avastin 12/25/2013  Cycle 8 FOLFIRI/Avastin 01/10/2014  Cycle 9 FOLFIRI/Avastin 01/24/2014  Cycle 10 FOLFIRI/Avastin 02/07/2014  Cycle of 11 FOLFIRI/Avastin 02/21/2014  Restaging CT 03/05/2014 with a decrease in the size of 2 dominant right lung nodules, no new nodules, new pleural gas collection at the right apex  Cycle 12 FOLFIRI/Avastin 03/07/2014  Cycle 13 FOLFIRI/Avastin 03/20/2014  Cycle 14 FOLFIRI/Avastin 04/04/2014  Initiation  of maintenance Xeloda/Avastin 04/18/2014  CT chest 07/09/2014 revealed slight enlargement of lung nodules 2. Status post right VATS, drainage of pleural effusion, visceral and parietal pleural decortication 12/21/2012. Pleural fluid culture positive for viridans Streptococcus. Right pleura biopsies negative for malignancy. Findings favored empyema. 3. Diabetes. 4. Frequent bowel movements, diminished pelvic muscle tone-we made a referral to the pelvic physical therapy clinic 5. Pain in the left leg with tenderness at the left calf and left ischium-MRI of the lumbar spine and pelvis on 10/13/2011 with no evidence of metastatic disease. A right iliac bone "lesion" was felt to most likely represent heterogenous marrow. Edema and enhancement was noted at the left sciatic nerve compared to the right side with no mass lesion 6. Hypertension-maintained on amlodipine 7. Left foot fracture 8. 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 I reviewed the CT images with her. There is minimal enlargement of bilateral lung nodules compared to the CT from 4 months ago. We discussed treatment options including a treatment break, resuming FOLFIRI/Avastin, and switching to oxaliplatin-based chemotherapy. She has not "failed "FOLFIRI and there very minimal enlargement of lung nodules with no new sites of metastatic disease. She is not comfortable with observation. We decided to resume FOLFIRI/Avastin.  She will be treated with Avastin today and return for FOLFIRI/Avastin on 07/25/2014.   Betsy Coder, MD  07/10/2014  8:30 AM

## 2014-07-11 ENCOUNTER — Other Ambulatory Visit: Payer: 59

## 2014-07-11 ENCOUNTER — Telehealth: Payer: Self-pay | Admitting: Oncology

## 2014-07-11 ENCOUNTER — Ambulatory Visit: Payer: 59 | Admitting: Oncology

## 2014-07-11 ENCOUNTER — Telehealth: Payer: Self-pay | Admitting: *Deleted

## 2014-07-11 ENCOUNTER — Ambulatory Visit: Payer: 59

## 2014-07-11 NOTE — Telephone Encounter (Signed)
Per staff message and POF I have scheduled appts. Advised scheduler of appts. [name]  

## 2014-07-11 NOTE — Telephone Encounter (Signed)
Confirm appointment for April 27.

## 2014-07-22 ENCOUNTER — Other Ambulatory Visit: Payer: Self-pay | Admitting: Oncology

## 2014-07-25 ENCOUNTER — Other Ambulatory Visit: Payer: Self-pay | Admitting: *Deleted

## 2014-07-25 ENCOUNTER — Telehealth: Payer: Self-pay | Admitting: Oncology

## 2014-07-25 ENCOUNTER — Ambulatory Visit (HOSPITAL_BASED_OUTPATIENT_CLINIC_OR_DEPARTMENT_OTHER): Payer: 59

## 2014-07-25 ENCOUNTER — Ambulatory Visit (HOSPITAL_BASED_OUTPATIENT_CLINIC_OR_DEPARTMENT_OTHER): Payer: 59 | Admitting: Oncology

## 2014-07-25 ENCOUNTER — Other Ambulatory Visit (HOSPITAL_BASED_OUTPATIENT_CLINIC_OR_DEPARTMENT_OTHER): Payer: 59

## 2014-07-25 VITALS — BP 175/79 | HR 75

## 2014-07-25 VITALS — BP 189/76 | HR 76 | Temp 98.4°F | Resp 18 | Ht 64.0 in | Wt 129.9 lb

## 2014-07-25 DIAGNOSIS — C7800 Secondary malignant neoplasm of unspecified lung: Principal | ICD-10-CM

## 2014-07-25 DIAGNOSIS — C7802 Secondary malignant neoplasm of left lung: Secondary | ICD-10-CM | POA: Diagnosis not present

## 2014-07-25 DIAGNOSIS — C20 Malignant neoplasm of rectum: Secondary | ICD-10-CM

## 2014-07-25 DIAGNOSIS — Z5112 Encounter for antineoplastic immunotherapy: Secondary | ICD-10-CM | POA: Diagnosis not present

## 2014-07-25 DIAGNOSIS — C7801 Secondary malignant neoplasm of right lung: Secondary | ICD-10-CM | POA: Diagnosis not present

## 2014-07-25 DIAGNOSIS — Z5111 Encounter for antineoplastic chemotherapy: Secondary | ICD-10-CM

## 2014-07-25 DIAGNOSIS — I10 Essential (primary) hypertension: Secondary | ICD-10-CM | POA: Diagnosis not present

## 2014-07-25 DIAGNOSIS — E119 Type 2 diabetes mellitus without complications: Secondary | ICD-10-CM

## 2014-07-25 LAB — CBC WITH DIFFERENTIAL/PLATELET
BASO%: 1.6 % (ref 0.0–2.0)
Basophils Absolute: 0.1 10*3/uL (ref 0.0–0.1)
EOS%: 3.5 % (ref 0.0–7.0)
Eosinophils Absolute: 0.1 10*3/uL (ref 0.0–0.5)
HCT: 37.4 % (ref 34.8–46.6)
HGB: 11.9 g/dL (ref 11.6–15.9)
LYMPH#: 0.8 10*3/uL — AB (ref 0.9–3.3)
LYMPH%: 19.2 % (ref 14.0–49.7)
MCH: 27.7 pg (ref 25.1–34.0)
MCHC: 31.8 g/dL (ref 31.5–36.0)
MCV: 87.2 fL (ref 79.5–101.0)
MONO#: 0.4 10*3/uL (ref 0.1–0.9)
MONO%: 9.9 % (ref 0.0–14.0)
NEUT#: 2.7 10*3/uL (ref 1.5–6.5)
NEUT%: 65.8 % (ref 38.4–76.8)
Platelets: 239 10*3/uL (ref 145–400)
RBC: 4.29 10*6/uL (ref 3.70–5.45)
RDW: 23.7 % — AB (ref 11.2–14.5)
WBC: 4.1 10*3/uL (ref 3.9–10.3)

## 2014-07-25 LAB — COMPREHENSIVE METABOLIC PANEL (CC13)
ALBUMIN: 4.4 g/dL (ref 3.5–5.0)
ALT: 14 U/L (ref 0–55)
ANION GAP: 12 meq/L — AB (ref 3–11)
AST: 15 U/L (ref 5–34)
Alkaline Phosphatase: 43 U/L (ref 40–150)
BUN: 15.1 mg/dL (ref 7.0–26.0)
CHLORIDE: 103 meq/L (ref 98–109)
CO2: 26 meq/L (ref 22–29)
Calcium: 10.5 mg/dL — ABNORMAL HIGH (ref 8.4–10.4)
Creatinine: 0.7 mg/dL (ref 0.6–1.1)
EGFR: 88 mL/min/{1.73_m2} — ABNORMAL LOW (ref >90)
Glucose: 135 mg/dl (ref 70–140)
Potassium: 4.1 mEq/L (ref 3.5–5.1)
Sodium: 141 mEq/L (ref 136–145)
TOTAL PROTEIN: 7.1 g/dL (ref 6.4–8.3)
Total Bilirubin: 0.47 mg/dL (ref 0.20–1.20)

## 2014-07-25 LAB — UA PROTEIN, DIPSTICK - CHCC: Protein, ur: <30 mg/dL

## 2014-07-25 MED ORDER — OXYCODONE-ACETAMINOPHEN 10-325 MG PO TABS: 1.0000 | ORAL_TABLET | ORAL | Status: DC | PRN

## 2014-07-25 MED ORDER — PROCHLORPERAZINE MALEATE 5 MG PO TABS: 5.0000 mg | ORAL_TABLET | Freq: Four times a day (QID) | ORAL | Status: DC | PRN

## 2014-07-25 MED ORDER — SODIUM CHLORIDE 0.9 % IV SOLN
5.0000 mg/kg | Freq: Once | INTRAVENOUS | Status: AC
  Administered 2014-07-25: 300 mg via INTRAVENOUS
  Filled 2014-07-25: qty 12

## 2014-07-25 MED ORDER — FLUOROURACIL CHEMO INJECTION 5 GM/100ML
2400.0000 mg/m2 | INTRAVENOUS | Status: DC
  Administered 2014-07-25: 3900 mg via INTRAVENOUS
  Filled 2014-07-25: qty 78

## 2014-07-25 MED ORDER — HYDROCHLOROTHIAZIDE 12.5 MG PO CAPS: 12.5000 mg | ORAL_CAPSULE | Freq: Every day | ORAL | Status: DC

## 2014-07-25 MED ORDER — SODIUM CHLORIDE 0.9 % IV SOLN
Freq: Once | INTRAVENOUS | Status: AC
  Administered 2014-07-25: 10:00:00 via INTRAVENOUS
  Filled 2014-07-25: qty 8

## 2014-07-25 MED ORDER — FLUOROURACIL CHEMO INJECTION 2.5 GM/50ML
400.0000 mg/m2 | Freq: Once | INTRAVENOUS | Status: AC
  Administered 2014-07-25: 650 mg via INTRAVENOUS
  Filled 2014-07-25: qty 13

## 2014-07-25 MED ORDER — ATROPINE SULFATE 1 MG/ML IJ SOLN
INTRAMUSCULAR | Status: AC
  Filled 2014-07-25: qty 1

## 2014-07-25 MED ORDER — ATROPINE SULFATE 1 MG/ML IJ SOLN
0.5000 mg | Freq: Once | INTRAMUSCULAR | Status: AC | PRN
  Administered 2014-07-25: 0.5 mg via INTRAVENOUS

## 2014-07-25 MED ORDER — LEUCOVORIN CALCIUM INJECTION 350 MG
405.0000 mg/m2 | Freq: Once | INTRAVENOUS | Status: AC
  Administered 2014-07-25: 660 mg via INTRAVENOUS
  Filled 2014-07-25: qty 33

## 2014-07-25 MED ORDER — SODIUM CHLORIDE 0.9 % IV SOLN
Freq: Once | INTRAVENOUS | Status: AC
  Administered 2014-07-25: 10:00:00 via INTRAVENOUS

## 2014-07-25 MED ORDER — IRINOTECAN HCL CHEMO INJECTION 100 MG/5ML
184.0000 mg/m2 | Freq: Once | INTRAVENOUS | Status: AC
  Administered 2014-07-25: 300 mg via INTRAVENOUS
  Filled 2014-07-25: qty 15

## 2014-07-25 NOTE — Patient Instructions (Signed)
[facility] Discharge Instructions for Patients Receiving Chemotherapy  Today you received the following chemotherapy agents: Avastin, Camptosar, Leucovorin and Fluorouracil. To help prevent nausea and vomiting after your treatment, we encourage you to take your nausea medication: Compazine. Take one every six hours as needed.   If you develop nausea and vomiting that is not controlled by your nausea medication, call the clinic.   BELOW ARE SYMPTOMS THAT SHOULD BE REPORTED IMMEDIATELY:  *FEVER GREATER THAN 100.5 F  *CHILLS WITH OR WITHOUT FEVER  NAUSEA AND VOMITING THAT IS NOT CONTROLLED WITH YOUR NAUSEA MEDICATION  *UNUSUAL SHORTNESS OF BREATH  *UNUSUAL BRUISING OR BLEEDING  TENDERNESS IN MOUTH AND THROAT WITH OR WITHOUT PRESENCE OF ULCERS  *URINARY PROBLEMS  *BOWEL PROBLEMS  UNUSUAL RASH Items with * indicate a potential emergency and should be followed up as soon as possible.  Feel free to call the clinic you have any questions or concerns. The clinic phone number is [phone].  Please show the [facility] at check-in to the Emergency Department and triage nurse.

## 2014-07-25 NOTE — Progress Notes (Signed)
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 She was treated with Avastin 07/10/2014. No bleeding or symptom of thrombosis. She reports increased bilateral leg swelling last weekend. She discontinued amlodipine and the swelling has resolved. She has been exercising by walking.   Objective:  Vital signs in last 24 hours:  Blood pressure 189/76, pulse 76, temperature 98.4 F (36.9 C), temperature source Oral, resp. rate 18, height $RemoveBe'5\' 4"'QlIJSiTPF$  (1.626 m), weight 129 lb 14.4 oz (58.922 kg), SpO2 99 %.    HEENT: No thrush or ulcers Resp: Decreased presence of a right lower chest Cardio: Regular rate and rhythm GI: No hepatosplenomegaly, nontender Vascular: The left lower leg and foot slightly larger than the right side. No edema.   Portacath/PICC-without erythema  Lab Results:  Lab Results  Component Value Date   WBC 4.1 07/25/2014   HGB 11.9 07/25/2014   HCT 37.4 07/25/2014   MCV 87.2 07/25/2014   PLT 239 07/25/2014   NEUTROABS 2.7 07/25/2014     Lab Results  Component Value Date   CEA 2.0 06/01/2014    Medications: I have reviewed the patient's current medications.  Assessment/Plan: 1. Metastatic rectal cancer diagnosed July 2014.  Colonoscopy on 10/25/2012 showed a bulky circumferential friable mass lesion in the rectum at approximately 4 cm above the dentate line. The mass was subtotally obstructing and would not permit passage of the colonoscope proximal. Biopsy showed adenocarcinoma.   Staging CT scans 10/25/2012 showed a right lower lobe lung mass measuring 5 x 4.6 cm; 0.8 cm nodule right upper lobe; masslike rectosigmoid wall thickening, surrounding stranding and adjacent lymphadenopathy.   PET scan 11/02/2012 showed hypermetabolism in the area of apparent circumferential rectal wall thickening; the 5 cm right lower lobe pulmonary mass was hypermetabolic with SUV max equal 8. No  other hypermetabolic lung lesions were evident. Small bilateral pulmonary parenchymal nodules were noted.   She completed 5 cycles of FOLFOX chemotherapy 11/22/2012 through 02/13/2013.   Status post right thoracotomy, bilobectomy (right lower and middle lobectomy) with en bloc resection of the diaphragm 02/27/2013. Pathology showed adenocarcinoma consistent with metastatic colorectal adenocarcinoma, 4.3 cm, negative margins, no lymph node involvement. Negative soft tissue biopsy of the diaphragm x2. Positive for K-ras mutation-codon 13   She completed concurrent radiation and Xeloda for treatment of the rectal tumor 03/06/2013 through 04/11/2013.   Status post low anterior resection with a diverting loop ileostomy on 05/31/2013 at [facility] in [place]. Final pathology showed moderately differentiated adenocarcinoma located in the upper rectum; the tumor measured 2.5 x 2.2 cm; there was invasion through the muscularis propria into pericolic adipose tissue; tumor budding was present. Lymphatic status negative; vascular status negative; proximal and distal surgical margins negative; 2 of 17 lymph nodes showed metastatic adenocarcinoma. Mesenteric tumor deposits were present. Perineural invasion present. [facility] confirmed an additional positive lymph node and the tumor was staged as a T4a   CT abdomen/pelvis 05/12/2013. Metastatic nodules in the lung increased. Chronic loculated right base pneumothorax. Diffuse mucosal thickening of the sigmoid portion of the colon.   Restaging CTs 07/25/2013 with enlargement of bilateral lung nodules, no other evidence of metastatic disease   Ileostomy takedown 08/14/2013   Chest CT 10/02/2013 revealed enlargement of bilateral lung nodules and increased right pleural fluid   Cycle 1 of FOLFIRI/Avastin 10/03/2013   Cycle 6 of FOLFIRI/Avastin 12/12/2013   Restaging CT 12/22/2013 with a decrease in the size  of bilateral lung nodules,  no evidence of disease progression   Cycle 7 FOLFIRI/Avastin 12/25/2013  Cycle 8 FOLFIRI/Avastin 01/10/2014  Cycle 9 FOLFIRI/Avastin 01/24/2014  Cycle 10 FOLFIRI/Avastin 02/07/2014  Cycle of 11 FOLFIRI/Avastin 02/21/2014  Restaging CT 03/05/2014 with a decrease in the size of 2 dominant right lung nodules, no new nodules, new pleural gas collection at the right apex  Cycle 12 FOLFIRI/Avastin 03/07/2014  Cycle 13 FOLFIRI/Avastin 03/20/2014  Cycle 14 FOLFIRI/Avastin 04/04/2014  Initiation of maintenance Xeloda/Avastin 04/18/2014  CT chest 07/09/2014 revealed slight enlargement of lung nodules  Treatment with single agent Avastin 07/10/2014  Resumption of FOLFIRI/Avastin on a 2 week schedule for 27 2016 2. Status post right VATS, drainage of pleural effusion, visceral and parietal pleural decortication 12/21/2012. Pleural fluid culture positive for viridans Streptococcus. Right pleura biopsies negative for malignancy. Findings favored empyema. 3. Diabetes. 4. Frequent bowel movements, diminished pelvic muscle tone-we made a referral to the pelvic physical therapy clinic 5. Pain in the left leg with tenderness at the left calf and left ischium-MRI of the lumbar spine and pelvis on 10/13/2011 with no evidence of metastatic disease. A right iliac bone "lesion" was felt to most likely represent heterogenous marrow. Edema and enhancement was noted at the left sciatic nerve compared to the right side with no mass lesion 6. Hypertension 7. Left foot fracture 8. 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 The plan is to resume FOLFIRI/Avastin today. She discontinued Norvasc secondary to edema. Her blood pressure is elevated today. We will repeat the blood pressure while she is in the chemotherapy room today. If persistently elevated we will add a different antihypertensive agent. She will return for an office visit and chemotherapy in 2 weeks.  Betsy Coder,  MD  07/25/2014  9:08 AM

## 2014-07-25 NOTE — Progress Notes (Signed)
1235: Pt reported lower abdominal cramping and nausea. Camptosar infusion complete. Leucovorin infusion paused, Atropine administered, pt reported relief within minutes. Leucovorin resumed, 5FU push and pump administered.  Informed pt Compazine Rx was sent electronically to her pharmacy.

## 2014-07-25 NOTE — Telephone Encounter (Signed)
pt will get updated sched in chemo

## 2014-07-27 ENCOUNTER — Ambulatory Visit (HOSPITAL_BASED_OUTPATIENT_CLINIC_OR_DEPARTMENT_OTHER): Payer: 59

## 2014-07-27 ENCOUNTER — Telehealth: Payer: Self-pay

## 2014-07-27 VITALS — BP 146/84 | HR 85 | Temp 97.9°F | Resp 18

## 2014-07-27 DIAGNOSIS — C7802 Secondary malignant neoplasm of left lung: Secondary | ICD-10-CM | POA: Diagnosis not present

## 2014-07-27 DIAGNOSIS — C20 Malignant neoplasm of rectum: Secondary | ICD-10-CM | POA: Diagnosis not present

## 2014-07-27 DIAGNOSIS — C7800 Secondary malignant neoplasm of unspecified lung: Principal | ICD-10-CM

## 2014-07-27 DIAGNOSIS — C7801 Secondary malignant neoplasm of right lung: Secondary | ICD-10-CM

## 2014-07-27 MED ORDER — SODIUM CHLORIDE 0.9 % IJ SOLN
10.0000 mL | INTRAMUSCULAR | Status: DC | PRN
  Administered 2014-07-27: 10 mL
  Filled 2014-07-27: qty 10

## 2014-07-27 MED ORDER — HEPARIN SOD (PORK) LOCK FLUSH 100 UNIT/ML IV SOLN
500.0000 [IU] | Freq: Once | INTRAVENOUS | Status: AC | PRN
  Administered 2014-07-27: 500 [IU]
  Filled 2014-07-27: qty 5

## 2014-07-27 NOTE — Patient Instructions (Signed)

## 2014-07-27 NOTE — Telephone Encounter (Signed)
Note from Memory Dance, RN for chemo f/u to be done, and re-teach imodium instructions. Pt denies having to use any imodium, states she does not have any diarrhea. States her stomach has been a little crampy (like period cramps) but is controlling the pain. Pt also states she has been on chemo, and hasn't started in new tx but denies any problems with her tx. Informed pt to call with any questions or concerns. Pt verbalized understanding.

## 2014-08-04 ENCOUNTER — Other Ambulatory Visit: Payer: Self-pay | Admitting: Oncology

## 2014-08-08 ENCOUNTER — Ambulatory Visit (HOSPITAL_BASED_OUTPATIENT_CLINIC_OR_DEPARTMENT_OTHER): Payer: 59 | Admitting: Oncology

## 2014-08-08 ENCOUNTER — Telehealth: Payer: Self-pay | Admitting: Oncology

## 2014-08-08 ENCOUNTER — Telehealth: Payer: Self-pay | Admitting: *Deleted

## 2014-08-08 ENCOUNTER — Other Ambulatory Visit (HOSPITAL_BASED_OUTPATIENT_CLINIC_OR_DEPARTMENT_OTHER): Payer: 59

## 2014-08-08 ENCOUNTER — Ambulatory Visit (HOSPITAL_BASED_OUTPATIENT_CLINIC_OR_DEPARTMENT_OTHER): Payer: 59

## 2014-08-08 VITALS — BP 185/88 | HR 116 | Temp 98.6°F | Resp 18 | Ht 64.0 in | Wt 125.6 lb

## 2014-08-08 VITALS — BP 144/72 | HR 85

## 2014-08-08 DIAGNOSIS — C7800 Secondary malignant neoplasm of unspecified lung: Principal | ICD-10-CM

## 2014-08-08 DIAGNOSIS — C20 Malignant neoplasm of rectum: Secondary | ICD-10-CM

## 2014-08-08 DIAGNOSIS — Z5112 Encounter for antineoplastic immunotherapy: Secondary | ICD-10-CM | POA: Diagnosis not present

## 2014-08-08 DIAGNOSIS — C7801 Secondary malignant neoplasm of right lung: Secondary | ICD-10-CM

## 2014-08-08 DIAGNOSIS — C7802 Secondary malignant neoplasm of left lung: Secondary | ICD-10-CM

## 2014-08-08 DIAGNOSIS — I10 Essential (primary) hypertension: Secondary | ICD-10-CM | POA: Diagnosis not present

## 2014-08-08 DIAGNOSIS — Z5111 Encounter for antineoplastic chemotherapy: Secondary | ICD-10-CM | POA: Diagnosis not present

## 2014-08-08 DIAGNOSIS — E119 Type 2 diabetes mellitus without complications: Secondary | ICD-10-CM

## 2014-08-08 LAB — CBC WITH DIFFERENTIAL/PLATELET
BASO%: 1.1 % (ref 0.0–2.0)
Basophils Absolute: 0 10*3/uL (ref 0.0–0.1)
EOS ABS: 0.1 10*3/uL (ref 0.0–0.5)
EOS%: 3.3 % (ref 0.0–7.0)
HEMATOCRIT: 39.1 % (ref 34.8–46.6)
HGB: 12.8 g/dL (ref 11.6–15.9)
LYMPH%: 23.9 % (ref 14.0–49.7)
MCH: 28.4 pg (ref 25.1–34.0)
MCHC: 32.8 g/dL (ref 31.5–36.0)
MCV: 86.6 fL (ref 79.5–101.0)
MONO#: 0.3 10*3/uL (ref 0.1–0.9)
MONO%: 8.1 % (ref 0.0–14.0)
NEUT#: 2 10*3/uL (ref 1.5–6.5)
NEUT%: 63.6 % (ref 38.4–76.8)
Platelets: 221 10*3/uL (ref 145–400)
RBC: 4.52 10*6/uL (ref 3.70–5.45)
RDW: 23.5 % — AB (ref 11.2–14.5)
WBC: 3.1 10*3/uL — ABNORMAL LOW (ref 3.9–10.3)
lymph#: 0.7 10*3/uL — ABNORMAL LOW (ref 0.9–3.3)

## 2014-08-08 LAB — COMPREHENSIVE METABOLIC PANEL (CC13)
ALK PHOS: 48 U/L (ref 40–150)
ALT: 18 U/L (ref 0–55)
AST: 19 U/L (ref 5–34)
Albumin: 4.6 g/dL (ref 3.5–5.0)
Anion Gap: 13 mEq/L — ABNORMAL HIGH (ref 3–11)
BILIRUBIN TOTAL: 0.5 mg/dL (ref 0.20–1.20)
BUN: 14.1 mg/dL (ref 7.0–26.0)
CO2: 29 mEq/L (ref 22–29)
CREATININE: 0.7 mg/dL (ref 0.6–1.1)
Calcium: 11.1 mg/dL — ABNORMAL HIGH (ref 8.4–10.4)
Chloride: 101 mEq/L (ref 98–109)
EGFR: 83 mL/min/{1.73_m2} — AB (ref >90)
GLUCOSE: 149 mg/dL — AB (ref 70–140)
Potassium: 4.2 mEq/L (ref 3.5–5.1)
Sodium: 143 mEq/L (ref 136–145)
Total Protein: 7.3 g/dL (ref 6.4–8.3)

## 2014-08-08 MED ORDER — SODIUM CHLORIDE 0.9 % IV SOLN
Freq: Once | INTRAVENOUS | Status: AC
  Administered 2014-08-08: 10:00:00 via INTRAVENOUS

## 2014-08-08 MED ORDER — SODIUM CHLORIDE 0.9 % IV SOLN
2400.0000 mg/m2 | INTRAVENOUS | Status: DC
  Administered 2014-08-08: 3900 mg via INTRAVENOUS
  Filled 2014-08-08: qty 78

## 2014-08-08 MED ORDER — DEXAMETHASONE SODIUM PHOSPHATE 100 MG/10ML IJ SOLN
Freq: Once | INTRAMUSCULAR | Status: AC
  Administered 2014-08-08: 10:00:00 via INTRAVENOUS
  Filled 2014-08-08: qty 8

## 2014-08-08 MED ORDER — SODIUM CHLORIDE 0.9 % IV SOLN
5.0000 mg/kg | Freq: Once | INTRAVENOUS | Status: AC
  Administered 2014-08-08: 300 mg via INTRAVENOUS
  Filled 2014-08-08: qty 12

## 2014-08-08 MED ORDER — ATROPINE SULFATE 1 MG/ML IJ SOLN
INTRAMUSCULAR | Status: AC
  Filled 2014-08-08: qty 1

## 2014-08-08 MED ORDER — ATROPINE SULFATE 1 MG/ML IJ SOLN
0.5000 mg | Freq: Once | INTRAMUSCULAR | Status: AC | PRN
  Administered 2014-08-08: 0.5 mg via INTRAVENOUS

## 2014-08-08 MED ORDER — IRINOTECAN HCL CHEMO INJECTION 100 MG/5ML
184.0000 mg/m2 | Freq: Once | INTRAVENOUS | Status: AC
  Administered 2014-08-08: 300 mg via INTRAVENOUS
  Filled 2014-08-08: qty 15

## 2014-08-08 MED ORDER — LEUCOVORIN CALCIUM INJECTION 350 MG
400.0000 mg/m2 | Freq: Once | INTRAVENOUS | Status: AC
  Administered 2014-08-08: 652 mg via INTRAVENOUS
  Filled 2014-08-08: qty 32.6

## 2014-08-08 MED ORDER — FLUOROURACIL CHEMO INJECTION 2.5 GM/50ML
400.0000 mg/m2 | Freq: Once | INTRAVENOUS | Status: AC
  Administered 2014-08-08: 650 mg via INTRAVENOUS
  Filled 2014-08-08: qty 13

## 2014-08-08 NOTE — Telephone Encounter (Signed)
per pof to sch pt appt-sent MW email to sch trmt-gave tp copy of sch

## 2014-08-08 NOTE — Progress Notes (Signed)
[place] OFFICE PROGRESS NOTE   Diagnosis: Rectal cancer  INTERVAL HISTORY:   Dr. [name] returns as scheduled. She completed another cycle of FOLFIRI/Avastin 07/25/2014. She tolerated the treatment well. No nausea or diarrhea. She is exercising.  Objective:  Vital signs in last 24 hours:  Blood pressure 185/88, pulse 116, temperature 98.6 F (37 C), temperature source Oral, resp. rate 18, height $RemoveBe'5\' 4"'kUXljGdRt$  (1.626 m), weight 125 lb 9.6 oz (56.972 kg), SpO2 95 %.    HEENT: No thrush or ulcers Resp: Decreased breath sounds at the right lower chest, no respiratory distress Cardio: Regular rhythm GI: No hepatosplenomegaly, nontender, no mass Vascular: No leg edema   Portacath/PICC-without erythema  Lab Results:  Lab Results  Component Value Date   WBC 3.1* 08/08/2014   HGB 12.8 08/08/2014   HCT 39.1 08/08/2014   MCV 86.6 08/08/2014   PLT 221 08/08/2014   NEUTROABS 2.0 08/08/2014   calcium 11.1, albumin 4.6   Lab Results  Component Value Date   CEA 2.0 06/01/2014    Medications: I have reviewed the patient's current medications.  Assessment/Plan: 1. Metastatic rectal cancer diagnosed July 2014.  Colonoscopy on 10/25/2012 showed a bulky circumferential friable mass lesion in the rectum at approximately 4 cm above the dentate line. The mass was subtotally obstructing and would not permit passage of the colonoscope proximal. Biopsy showed adenocarcinoma.   Staging CT scans 10/25/2012 showed a right lower lobe lung mass measuring 5 x 4.6 cm; 0.8 cm nodule right upper lobe; masslike rectosigmoid wall thickening, surrounding stranding and adjacent lymphadenopathy.   PET scan 11/02/2012 showed hypermetabolism in the area of apparent circumferential rectal wall thickening; the 5 cm right lower lobe pulmonary mass was hypermetabolic with SUV max equal 8. No other hypermetabolic lung lesions were evident. Small bilateral pulmonary parenchymal nodules were noted.    She completed 5 cycles of FOLFOX chemotherapy 11/22/2012 through 02/13/2013.   Status post right thoracotomy, bilobectomy (right lower and middle lobectomy) with en bloc resection of the diaphragm 02/27/2013. Pathology showed adenocarcinoma consistent with metastatic colorectal adenocarcinoma, 4.3 cm, negative margins, no lymph node involvement. Negative soft tissue biopsy of the diaphragm x2. Positive for K-ras mutation-codon 13   She completed concurrent radiation and Xeloda for treatment of the rectal tumor 03/06/2013 through 04/11/2013.   Status post low anterior resection with a diverting loop ileostomy on 05/31/2013 at [facility] in [place]. Final pathology showed moderately differentiated adenocarcinoma located in the upper rectum; the tumor measured 2.5 x 2.2 cm; there was invasion through the muscularis propria into pericolic adipose tissue; tumor budding was present. Lymphatic status negative; vascular status negative; proximal and distal surgical margins negative; 2 of 17 lymph nodes showed metastatic adenocarcinoma. Mesenteric tumor deposits were present. Perineural invasion present. Pathology consult at [facility] confirmed an additional positive lymph node and the tumor was staged as a T4a   CT abdomen/pelvis 05/12/2013. Metastatic nodules in the lung increased. Chronic loculated right base pneumothorax. Diffuse mucosal thickening of the sigmoid portion of the colon.   Restaging CTs 07/25/2013 with enlargement of bilateral lung nodules, no other evidence of metastatic disease   Ileostomy takedown 08/14/2013   Chest CT 10/02/2013 revealed enlargement of bilateral lung nodules and increased right pleural fluid   Cycle 1 of FOLFIRI/Avastin 10/03/2013   Cycle 6 of FOLFIRI/Avastin 12/12/2013   Restaging CT 12/22/2013 with a decrease in the size of bilateral lung nodules, no evidence of disease progression   Cycle 7 FOLFIRI/Avastin 12/25/2013  Cycle 8 FOLFIRI/Avastin  01/10/2014  Cycle 9 FOLFIRI/Avastin 01/24/2014  Cycle 10 FOLFIRI/Avastin 02/07/2014  Cycle of 11 FOLFIRI/Avastin 02/21/2014  Restaging CT 03/05/2014 with a decrease in the size of 2 dominant right lung nodules, no new nodules, new pleural gas collection at the right apex  Cycle 12 FOLFIRI/Avastin 03/07/2014  Cycle 13 FOLFIRI/Avastin 03/20/2014  Cycle 14 FOLFIRI/Avastin 04/04/2014  Initiation of maintenance Xeloda/Avastin 04/18/2014  CT chest 07/09/2014 revealed slight enlargement of lung nodules  Treatment with single agent Avastin 07/10/2014  Resumption of FOLFIRI/Avastin on a 2 week schedule 07/25/2014 2. Status post right VATS, drainage of pleural effusion, visceral and parietal pleural decortication 12/21/2012. Pleural fluid culture positive for viridans Streptococcus. Right pleura biopsies negative for malignancy. Findings favored empyema. 3. Diabetes. 4. Frequent bowel movements, diminished pelvic muscle tone-we made a referral to the pelvic physical therapy clinic 5. Pain in the left leg with tenderness at the left calf and left ischium-MRI of the lumbar spine and pelvis on 10/13/2011 with no evidence of metastatic disease. A right iliac bone "lesion" was felt to most likely represent heterogenous marrow. Edema and enhancement was noted at the left sciatic nerve compared to the right side with no mass lesion 6. Hypertension-on HCTZ 7. Left foot fracture 8. 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 She had coffee immediately prior to the blood pressure/pulse check today. We will repeat her vital signs when she arrives in the chemotherapy room. The plan is to continue FOLFIRI/Avastin every 2 weeks. She will return for an office visit in 2 weeks. The calcium has been intermittently elevated, likely secondary to the calcium supplement she takes in the morning prior to blood draws here.  Betsy Coder, MD  08/08/2014  8:44 AM

## 2014-08-08 NOTE — Patient Instructions (Addendum)
[facility] Discharge Instructions for Patients Receiving Chemotherapy  Today you received the following chemotherapy agents Camptosar, Leucovorin, 5-FU, Avastin  To help prevent nausea and vomiting after your treatment, we encourage you to take your nausea medication     If you develop nausea and vomiting that is not controlled by your nausea medication, call the clinic.   BELOW ARE SYMPTOMS THAT SHOULD BE REPORTED IMMEDIATELY:  *FEVER GREATER THAN 100.5 F  *CHILLS WITH OR WITHOUT FEVER  NAUSEA AND VOMITING THAT IS NOT CONTROLLED WITH YOUR NAUSEA MEDICATION  *UNUSUAL SHORTNESS OF BREATH  *UNUSUAL BRUISING OR BLEEDING  TENDERNESS IN MOUTH AND THROAT WITH OR WITHOUT PRESENCE OF ULCERS  *URINARY PROBLEMS  *BOWEL PROBLEMS  UNUSUAL RASH Items with * indicate a potential emergency and should be followed up as soon as possible.  Feel free to call the clinic you have any questions or concerns. The clinic phone number is [phone].  Please show the [facility] at check-in to the Emergency Department and triage nurse.

## 2014-08-08 NOTE — Telephone Encounter (Signed)
Per staff message and POF I have scheduled appts. Advised scheduler of appts. [name]  

## 2014-08-10 ENCOUNTER — Ambulatory Visit (HOSPITAL_BASED_OUTPATIENT_CLINIC_OR_DEPARTMENT_OTHER): Payer: 59

## 2014-08-10 ENCOUNTER — Other Ambulatory Visit: Payer: Self-pay | Admitting: Internal Medicine

## 2014-08-10 VITALS — BP 156/76 | HR 86 | Temp 98.6°F | Resp 18

## 2014-08-10 DIAGNOSIS — C7800 Secondary malignant neoplasm of unspecified lung: Principal | ICD-10-CM

## 2014-08-10 DIAGNOSIS — C20 Malignant neoplasm of rectum: Secondary | ICD-10-CM

## 2014-08-10 DIAGNOSIS — C7802 Secondary malignant neoplasm of left lung: Secondary | ICD-10-CM | POA: Diagnosis not present

## 2014-08-10 DIAGNOSIS — C7801 Secondary malignant neoplasm of right lung: Secondary | ICD-10-CM | POA: Diagnosis not present

## 2014-08-10 MED ORDER — SODIUM CHLORIDE 0.9 % IJ SOLN
3.0000 mL | INTRAMUSCULAR | Status: DC | PRN
  Filled 2014-08-10: qty 10

## 2014-08-10 MED ORDER — HEPARIN SOD (PORK) LOCK FLUSH 100 UNIT/ML IV SOLN
500.0000 [IU] | Freq: Once | INTRAVENOUS | Status: AC | PRN
  Administered 2014-08-10: 500 [IU]
  Filled 2014-08-10: qty 5

## 2014-08-10 MED ORDER — SODIUM CHLORIDE 0.9 % IJ SOLN
10.0000 mL | INTRAMUSCULAR | Status: DC | PRN
  Administered 2014-08-10: 10 mL
  Filled 2014-08-10: qty 10

## 2014-08-10 NOTE — Patient Instructions (Signed)

## 2014-08-13 ENCOUNTER — Other Ambulatory Visit (INDEPENDENT_AMBULATORY_CARE_PROVIDER_SITE_OTHER): Payer: 59

## 2014-08-13 DIAGNOSIS — E119 Type 2 diabetes mellitus without complications: Secondary | ICD-10-CM | POA: Diagnosis not present

## 2014-08-13 LAB — HEMOGLOBIN A1C: Hgb A1c MFr Bld: 6.5 % (ref 4.6–6.5)

## 2014-08-16 ENCOUNTER — Encounter: Payer: Self-pay | Admitting: Internal Medicine

## 2014-08-16 ENCOUNTER — Ambulatory Visit (INDEPENDENT_AMBULATORY_CARE_PROVIDER_SITE_OTHER): Payer: 59 | Admitting: Internal Medicine

## 2014-08-16 VITALS — BP 140/70 | Temp 97.9°F | Ht 63.0 in | Wt 120.5 lb

## 2014-08-16 DIAGNOSIS — R490 Dysphonia: Secondary | ICD-10-CM

## 2014-08-16 DIAGNOSIS — I10 Essential (primary) hypertension: Secondary | ICD-10-CM | POA: Diagnosis not present

## 2014-08-16 DIAGNOSIS — R4189 Other symptoms and signs involving cognitive functions and awareness: Secondary | ICD-10-CM

## 2014-08-16 DIAGNOSIS — Z299 Encounter for prophylactic measures, unspecified: Secondary | ICD-10-CM

## 2014-08-16 DIAGNOSIS — F919 Conduct disorder, unspecified: Secondary | ICD-10-CM

## 2014-08-16 DIAGNOSIS — E119 Type 2 diabetes mellitus without complications: Secondary | ICD-10-CM

## 2014-08-16 DIAGNOSIS — T451X5A Adverse effect of antineoplastic and immunosuppressive drugs, initial encounter: Secondary | ICD-10-CM

## 2014-08-16 DIAGNOSIS — C7800 Secondary malignant neoplasm of unspecified lung: Secondary | ICD-10-CM

## 2014-08-16 DIAGNOSIS — C20 Malignant neoplasm of rectum: Secondary | ICD-10-CM

## 2014-08-16 DIAGNOSIS — R634 Abnormal weight loss: Secondary | ICD-10-CM

## 2014-08-16 DIAGNOSIS — R4689 Other symptoms and signs involving appearance and behavior: Secondary | ICD-10-CM

## 2014-08-16 DIAGNOSIS — G620 Drug-induced polyneuropathy: Secondary | ICD-10-CM

## 2014-08-16 DIAGNOSIS — Z418 Encounter for other procedures for purposes other than remedying health state: Secondary | ICD-10-CM

## 2014-08-16 NOTE — Patient Instructions (Signed)
Will send a flag to Dr.  Benay Spice.    About getting thyroid  ( and lipid ) panel.   Also advisability of td  And zostavax( prob not) .  Will also   Consider   Your request for  Low dose concerta.   For the memory issues.    Chemo brain .

## 2014-08-16 NOTE — Progress Notes (Signed)
Pre visit review using our clinic review tool, if applicable. No additional management support is needed unless otherwise documented below in the visit note.   Chief Complaint  Patient presents with  . Follow-up    HPI: Patient  Anne [name]  69 y.o. comes in today for Chronic disease management  Care visit . In undergoing chemo rx for metastatic rectal cancer to lungs   Every other week . This last time alternating chemo.  No coughing vominytinhg. But severe diarrhea     Weight  Not hungry and hard to eat . No nausea    Just starting to eat again .  DM sugars good  In general denies lows thinks med do not have se .  Bp has been good .  having issues with memory related to rx and cancer  Not progressive but present for her. Ask a bout trial of low dose concerta .   No current cp sob cv sx .  Has been hoarse for about 4-6 weeks without cough HB ne meds illness   Brother   Died   Massive heart attack .  Was 400 pound  After fall. Humeral fx .  Sis under active chemo for colon cancer  genetic markers high risk  in family retiring in July 16   Health Maintenance  Topic Date Due  . FOOT EXAM  03/14/1954  . TETANUS/TDAP  03/15/1963  . ZOSTAVAX  03/14/2004  . DEXA SCAN  03/14/2009  . MAMMOGRAM  02/11/2013  . URINE MICROALBUMIN  10/17/2013  . INFLUENZA VACCINE  10/29/2014  . HEMOGLOBIN A1C  02/13/2015  . OPHTHALMOLOGY EXAM  03/06/2015  . COLONOSCOPY  10/26/2022  . PNA vac Low Risk Adult  Completed   Health Maintenance Review LIFESTYLE:   neg td , activity as tolerated no sugar drinks   ROS:  GEN/ HEENT: No fever,  headaches vision problems hearing changes, CV/ PULM; No chest pain shortness of breath cough, syncope,edema  change in exercise tolerance. GI /GU: No adominal pain, vomiting, . No blood in the stool. No significant GU symptoms. SKIN/HEME: ,no acute skin rashes suspicious lesions or bleeding. No lymphadenopathy, nodules, masses.  NEURO/ PSYCH:  No newneurologic  signs  Had numbness peripheral toes from chemo some dec memeoryy and attention   Denies clinical depression. IMM/ Allergy: No unusual infections.  Allergy .   REST of 12 system review negative except as per HPI   Past Medical History  Diagnosis Date  . Postmenopausal HRT (hormone replacement therapy) 07/01/2011    surgical menopause  . Hx of hyperglycemia 07/01/2011  . Hx of compression fracture of spine 1985    t12 after a  20 foot fall  . Hx of fracture of wrist     after fall   . 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 Hx of colonic polyps   . Early cataract     stoneburner   . High frequency hearing loss     [facility] followed   . Diabetes mellitus without complication   . Complication of anesthesia     pt hadpanic attacks with versed, no versed  . Status post chemotherapy     completed a total of three cycles of Folfox   . Colon cancer     rectal cancer with lung metastasis  . Skin cancer     multiple  . Heart murmur, systolic 07/01/2011    innocent per Dr. Elijah Birk   . Asthma  as a child none since 40 years old  . Headache(784.0)   . Family history of malignant neoplasm of breast   . Family history of malignant neoplasm of ovary   . 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 Fracture of left foot     2015 trip    Past Surgical History  Procedure Laterality Date  . Tonsillectomy  1952  . Adenoidectomy  1952  . Wrist surgery  1957    left reduction  . Compression fraction  1985    T12  . Anterior cruciate ligament repair  1993    right Murphy  . Cosmetic surgery  1999-2007    holderness blepharoplasty facial rhytidectomy mastopexy abdominoplasty   . Dental implant    . Dilation and curettage of uterus  1977  . Total abdominal hysterectomy  2005    with cystocele repair   . Colonoscopy N/A 10/24/2012    Procedure: COLONOSCOPY;   Surgeon: Hilarie Fredrickson, MD;  Location: [facility] ENDOSCOPY;  Service: Endoscopy;  Laterality: N/A;  . Colonoscopy N/A 10/25/2012    Procedure: COLONOSCOPY;  Surgeon: Hilarie Fredrickson, MD;  Location: [facility] ENDOSCOPY;  Service: Endoscopy;  Laterality: N/A;  . Video bronchoscopy Bilateral 10/27/2012    Procedure: VIDEO BRONCHOSCOPY WITH FLUORO;  Surgeon: Coralyn Helling, MD;  Location: [facility] ENDOSCOPY;  Service: Endoscopy;  Laterality: Bilateral;  . Portacath placement Left 11/21/2012    Procedure: INSERTION PORT-A-CATH;  Surgeon: Loreli Slot, MD;  Location: [facility] OR;  Service: Thoracic;  Laterality: Left;  . Video assisted thoracoscopy Right 12/21/2012    Procedure: VIDEO ASSISTED THORACOSCOPY;  Surgeon: Loreli Slot, MD;  Location: [facility] OR;  Service: Thoracic;  Laterality: Right;  . Pleural effusion drainage Right 12/21/2012    Procedure: DRAINAGE OF PLEURAL EFFUSION;  Surgeon: Loreli Slot, MD;  Location: [facility] OR;  Service: Thoracic;  Laterality: Right;  . Decortication Right 12/21/2012    Procedure: DECORTICATION;  Surgeon: Loreli Slot, MD;  Location: [facility] OR;  Service: Thoracic;  Laterality: Right;  . Thorocotomy with lobectomy Right 02/27/2013    Procedure: THOROCOTOMY WITH BI- LOBECTOMY;  Surgeon: Loreli Slot, MD;  Location: [facility] OR;  Service: Thoracic;  Laterality: Right;    Family History  Problem Relation Age of Onset  . Diabetes Father   . Thyroid disease Father   . Heart attack Father     died age 15  . Skin cancer Father   . Dementia Mother   . Hypertension Mother   . Hyperlipidemia Mother   . Obesity Mother   . Cancer Brother     retro lymphosarcoma agent orange  . Breast cancer Sister 26    BRIP1 pathogenic mutation  . Ovarian cancer Sister 5    currently 15  . Diabetes Brother   . Obesity Brother   . Hypertension Sister   . Asthma Sister   . Cancer Paternal Uncle     unk. primary; deceased 66    History   Social History  . Marital Status: Widowed     Spouse Name: N/A  . Number of Children: 2  . Years of Education: N/A   Occupational History  . PHYSICIAN    Social History Main Topics  . Smoking status: Never Smoker   . Smokeless tobacco: Never Used  . Alcohol Use: 1.0 oz/week  2 drink(s) per week     Comment: occ  . Drug Use: No  . Sexual Activity: No   Other Topics Concern  . None   Social History Narrative   Divorced  2006 after 57 years marriage    MD developmental Academic librarian    Social etoh   NON smoker   exercise 4 x per week  Has personal trainer   G4P2    Outpatient Prescriptions Prior to Visit  Medication Sig Dispense Refill  . ACCU-CHEK AVIVA PLUS test strip CHECK BLOOD GLUCOSE (SUGAR) TWICE DAILY 100 each 6  . Blood Glucose Monitoring Suppl (ACCU-CHEK AVIVA PLUS) W/DEVICE KIT AS DIRECTED 1 kit 0  . Calcium Carbonate (CALCIUM 600 PO) Take 1 tablet by mouth daily.     Marland Kitchen estradiol (MINIVELLE) 0.0375 MG/24HR Place 1 patch onto the skin 2 (two) times a week. Sundays and Thursdays    . hydrochlorothiazide (MICROZIDE) 12.5 MG capsule Take 1 capsule (12.5 mg total) by mouth daily. 30 capsule 1  . JANUVIA 100 MG tablet TAKE 1 TABLET BY MOUTH ONCE DAILY 30 tablet 2  . metFORMIN (GLUCOPHAGE) 1000 MG tablet TAKE ONE TABLET TWICE DAILY WITH A MEAL 180 tablet 1  . methylTESTOSTERone (ANDROID) 10 MG capsule Take 10 mg by mouth.    . Multiple Vitamin (MULTIVITAMIN) tablet Take 1 tablet by mouth daily. Senior    . OVER THE COUNTER MEDICATION Place 1 drop into both ears 2 (two) times a week. Ear drops to decrease wax build up    . oxyCODONE-acetaminophen (PERCOCET) 10-325 MG per tablet Take 1 tablet by mouth every 4 (four) hours as needed for pain. 60 tablet 0  . psyllium (METAMUCIL) 58.6 % powder Take 1 packet by mouth daily.    . prochlorperazine (COMPAZINE) 5 MG tablet Take 1 tablet (5 mg total) by mouth every 6 (six) hours as needed for nausea or vomiting. 30 tablet 0   No facility-administered medications prior  to visit.     EXAM:  BP 140/70 mmHg  Temp(Src) 97.9 F (36.6 C) (Oral)  Ht $R'5\' 3"'Eu$  (1.6 m)  Wt 120 lb 8 oz (54.658 kg)  BMI 21.35 kg/m2  Body mass index is 21.35 kg/(m^2). Wt Readings from Last 3 Encounters:  08/16/14 120 lb 8 oz (54.658 kg)  08/08/14 125 lb 9.6 oz (56.972 kg)  07/25/14 129 lb 14.4 oz (58.922 kg)    Physical Exam: Vital signs reviewed YIR:SWNI is a well-developed slender but alert cooperative    who appearsr stated age in no acute distress.  Mild hoarseness  HEENT: normocephalic atraumatic , Eyes: PERRL EOM's full, conjunctiva clear, Nares: paten,t no deformity discharge or tenderness., Ears: no deformity EAC's clear TMs with normal landmarks. Mouth: clear OP, no lesions, edema.  Moist mucous membranes. Dentition in adequate repair. NECK: supple without masses, thyromegaly or bruits. CHEST/PULM:  Clear to auscultation and percussion breath sounds equal no wheeze , rales or rhonchi. No chest wall deformities or tenderness. CV: PMI is nondisplaced, S1 S2 no gallops, murmurs, rubs. Peripheral pulses are full without delay.No JVD .  ABDOMEN: Bowel sounds normal nontender  No guard or rebound, no hepato splenomegal no CVA tenderness.   Extremtities:  No clubbing cyanosis or edema, no acute joint swelling or redness no focal atrophy NEURO:  Oriented x3, cranial nerves 3-12 appear to be intact, no obvious focal weakness,gait within normal limits no abnormal reflexes or asymmetrical dec sense grossly distal toes no lesions  SKIN: No acute rashes normal turgor,  color, no bruising or petechiae. PSYCH: Oriented, good eye contact, no obvious depression anxiety nl speech  LN: no cervical axillary iadenopathy  Lab Results  Component Value Date   WBC 3.1* 08/08/2014   HGB 12.8 08/08/2014   HCT 39.1 08/08/2014   PLT 221 08/08/2014   GLUCOSE 149* 08/08/2014   CHOL 111 10/17/2012   TRIG 109.0 10/17/2012   HDL 35.60* 10/17/2012   LDLCALC 54 10/17/2012   ALT 18 08/08/2014    AST 19 08/08/2014   NA 143 08/08/2014   K 4.2 08/08/2014   CL 97 05/12/2013   CREATININE 0.7 08/08/2014   BUN 14.1 08/08/2014   CO2 29 08/08/2014   TSH 1.81 10/17/2012   INR 0.99 02/24/2013   HGBA1C 6.5 08/13/2014   MICROALBUR 2.2* 10/17/2012    ASSESSMENT AND PLAN:  Discussed the following assessment and plan:  Type 2 diabetes mellitus without complication  Essential hypertension - controlled  Rectal cancer metastasized to lung  Hoarseness - no ob cause  check tsh freet24 nex blood test fu if progressive  Loss of weight - recnet after chemo  Preventive measure - dsic immuniz ? if td shingles vaccine  but in active rx . ask cancer team.  Cognitive and behavioral changes - onset after cancer rx  fam hx dementia.  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 Pt denies  Se of current meds pres rive reeval  if needed Patient Care Team: Burnis Medin, MD as PCP - General (Internal Medicine) Gatha Mayer, MD (Gastroenterology) Aloha Gell, MD as Attending Physician (Obstetrics and Gynecology) Vicie Mutters, MD Shon Hough, MD (Ophthalmology) Elsie Saas, MD (Orthopedic Surgery) Franchot Gallo, MD as Attending Physician (Urology) Rolm Bookbinder, MD as Attending Physician (Dermatology) Consuela Mimes, MD as Consulting Physician (Oncology) Melrose Nakayama, MD as Consulting Physician (Cardiothoracic Surgery) Ladell Pier, MD as Consulting Physician (Oncology) Patient Instructions  Will send a flag to Dr.  Benay Spice.    About getting thyroid  ( and lipid ) panel.   Also advisability of td  And zostavax( prob not) .  Will also   Consider   Your request for  Low dose concerta.   For the memory issues.    Chemo brain .     Standley Brooking. Panosh M.D. Total visit 40 mins > 50% spent counseling and coordinating care as indicated in above note and in instructions to patient .

## 2014-08-19 ENCOUNTER — Other Ambulatory Visit: Payer: Self-pay | Admitting: Oncology

## 2014-08-20 ENCOUNTER — Other Ambulatory Visit: Payer: Self-pay | Admitting: Oncology

## 2014-08-20 DIAGNOSIS — R490 Dysphonia: Secondary | ICD-10-CM

## 2014-08-20 DIAGNOSIS — E785 Hyperlipidemia, unspecified: Secondary | ICD-10-CM

## 2014-08-20 DIAGNOSIS — E119 Type 2 diabetes mellitus without complications: Secondary | ICD-10-CM

## 2014-08-22 ENCOUNTER — Other Ambulatory Visit (HOSPITAL_BASED_OUTPATIENT_CLINIC_OR_DEPARTMENT_OTHER): Payer: 59

## 2014-08-22 ENCOUNTER — Ambulatory Visit (HOSPITAL_BASED_OUTPATIENT_CLINIC_OR_DEPARTMENT_OTHER): Payer: 59

## 2014-08-22 ENCOUNTER — Ambulatory Visit (HOSPITAL_BASED_OUTPATIENT_CLINIC_OR_DEPARTMENT_OTHER): Payer: Medicare Other | Admitting: Oncology

## 2014-08-22 ENCOUNTER — Telehealth: Payer: Self-pay | Admitting: Oncology

## 2014-08-22 ENCOUNTER — Telehealth: Payer: Self-pay | Admitting: *Deleted

## 2014-08-22 VITALS — BP 173/75 | HR 88 | Temp 98.5°F | Resp 18 | Ht 63.0 in | Wt 124.5 lb

## 2014-08-22 VITALS — BP 150/85 | HR 80

## 2014-08-22 DIAGNOSIS — C20 Malignant neoplasm of rectum: Secondary | ICD-10-CM

## 2014-08-22 DIAGNOSIS — Z5112 Encounter for antineoplastic immunotherapy: Secondary | ICD-10-CM

## 2014-08-22 DIAGNOSIS — C7800 Secondary malignant neoplasm of unspecified lung: Principal | ICD-10-CM

## 2014-08-22 DIAGNOSIS — R197 Diarrhea, unspecified: Secondary | ICD-10-CM

## 2014-08-22 DIAGNOSIS — R490 Dysphonia: Secondary | ICD-10-CM

## 2014-08-22 DIAGNOSIS — D709 Neutropenia, unspecified: Secondary | ICD-10-CM

## 2014-08-22 DIAGNOSIS — C7801 Secondary malignant neoplasm of right lung: Secondary | ICD-10-CM

## 2014-08-22 DIAGNOSIS — C7802 Secondary malignant neoplasm of left lung: Secondary | ICD-10-CM

## 2014-08-22 DIAGNOSIS — E119 Type 2 diabetes mellitus without complications: Secondary | ICD-10-CM

## 2014-08-22 DIAGNOSIS — I10 Essential (primary) hypertension: Secondary | ICD-10-CM | POA: Diagnosis not present

## 2014-08-22 DIAGNOSIS — Z5111 Encounter for antineoplastic chemotherapy: Secondary | ICD-10-CM | POA: Diagnosis not present

## 2014-08-22 LAB — COMPREHENSIVE METABOLIC PANEL (CC13)
ALK PHOS: 43 U/L (ref 40–150)
ALT: 45 U/L (ref 0–55)
ANION GAP: 12 meq/L — AB (ref 3–11)
AST: 31 U/L (ref 5–34)
Albumin: 4.4 g/dL (ref 3.5–5.0)
BILIRUBIN TOTAL: 0.58 mg/dL (ref 0.20–1.20)
BUN: 13.2 mg/dL (ref 7.0–26.0)
CO2: 27 mEq/L (ref 22–29)
CREATININE: 0.7 mg/dL (ref 0.6–1.1)
Calcium: 10.5 mg/dL — ABNORMAL HIGH (ref 8.4–10.4)
Chloride: 101 mEq/L (ref 98–109)
EGFR: 85 mL/min/{1.73_m2} — AB (ref >90)
Glucose: 119 mg/dl (ref 70–140)
Potassium: 4 mEq/L (ref 3.5–5.1)
SODIUM: 139 meq/L (ref 136–145)
Total Protein: 7 g/dL (ref 6.4–8.3)

## 2014-08-22 LAB — CBC WITH DIFFERENTIAL/PLATELET
BASO%: 1.2 % (ref 0.0–2.0)
Basophils Absolute: 0 10*3/uL (ref 0.0–0.1)
EOS%: 2.5 % (ref 0.0–7.0)
Eosinophils Absolute: 0.1 10*3/uL (ref 0.0–0.5)
HEMATOCRIT: 36.8 % (ref 34.8–46.6)
HGB: 12.2 g/dL (ref 11.6–15.9)
LYMPH%: 29.8 % (ref 14.0–49.7)
MCH: 28.4 pg (ref 25.1–34.0)
MCHC: 33.2 g/dL (ref 31.5–36.0)
MCV: 85.6 fL (ref 79.5–101.0)
MONO#: 0.2 10*3/uL (ref 0.1–0.9)
MONO%: 8.9 % (ref 0.0–14.0)
NEUT%: 57.6 % (ref 38.4–76.8)
NEUTROS ABS: 1.3 10*3/uL — AB (ref 1.5–6.5)
Platelets: 211 10*3/uL (ref 145–400)
RBC: 4.3 10*6/uL (ref 3.70–5.45)
RDW: 22.7 % — ABNORMAL HIGH (ref 11.2–14.5)
WBC: 2.3 10*3/uL — AB (ref 3.9–10.3)
lymph#: 0.7 10*3/uL — ABNORMAL LOW (ref 0.9–3.3)

## 2014-08-22 LAB — LIPID PANEL
CHOLESTEROL: 195 mg/dL (ref 0–200)
HDL: 55 mg/dL (ref >=46)
LDL Cholesterol: 117 mg/dL — ABNORMAL HIGH (ref 0–99)
Total CHOL/HDL Ratio: 3.5 Ratio
Triglycerides: 116 mg/dL (ref <150)
VLDL: 23 mg/dL (ref 0–40)

## 2014-08-22 LAB — T4, FREE: FREE T4: 1.43 ng/dL (ref 0.80–1.80)

## 2014-08-22 LAB — UA PROTEIN, DIPSTICK - CHCC: Protein, ur: <30 mg/dL

## 2014-08-22 LAB — TSH CHCC: TSH: 1.916 m(IU)/L (ref 0.308–3.960)

## 2014-08-22 MED ORDER — SODIUM CHLORIDE 0.9 % IV SOLN
Freq: Once | INTRAVENOUS | Status: AC
  Administered 2014-08-22: 10:00:00 via INTRAVENOUS

## 2014-08-22 MED ORDER — FLUOROURACIL CHEMO INJECTION 2.5 GM/50ML
400.0000 mg/m2 | Freq: Once | INTRAVENOUS | Status: AC
  Administered 2014-08-22: 650 mg via INTRAVENOUS
  Filled 2014-08-22: qty 13

## 2014-08-22 MED ORDER — ATROPINE SULFATE 1 MG/ML IJ SOLN
INTRAMUSCULAR | Status: AC
  Filled 2014-08-22: qty 1

## 2014-08-22 MED ORDER — HEPARIN SOD (PORK) LOCK FLUSH 100 UNIT/ML IV SOLN
500.0000 [IU] | Freq: Once | INTRAVENOUS | Status: DC | PRN
  Filled 2014-08-22: qty 5

## 2014-08-22 MED ORDER — SODIUM CHLORIDE 0.9 % IV SOLN
5.0000 mg/kg | Freq: Once | INTRAVENOUS | Status: AC
  Administered 2014-08-22: 300 mg via INTRAVENOUS
  Filled 2014-08-22: qty 12

## 2014-08-22 MED ORDER — SODIUM CHLORIDE 0.9 % IV SOLN
Freq: Once | INTRAVENOUS | Status: AC
  Administered 2014-08-22: 10:00:00 via INTRAVENOUS
  Filled 2014-08-22: qty 8

## 2014-08-22 MED ORDER — OXYCODONE-ACETAMINOPHEN 10-325 MG PO TABS: 1.0000 | ORAL_TABLET | ORAL | Status: DC | PRN

## 2014-08-22 MED ORDER — METHYLPHENIDATE HCL ER (OSM) 18 MG PO TBCR: 18.0000 mg | EXTENDED_RELEASE_TABLET | Freq: Every day | ORAL | Status: DC

## 2014-08-22 MED ORDER — SODIUM CHLORIDE 0.9 % IJ SOLN
10.0000 mL | INTRAMUSCULAR | Status: DC | PRN
  Filled 2014-08-22: qty 10

## 2014-08-22 MED ORDER — IRINOTECAN HCL CHEMO INJECTION 100 MG/5ML
184.0000 mg/m2 | Freq: Once | INTRAVENOUS | Status: AC
  Administered 2014-08-22: 300 mg via INTRAVENOUS
  Filled 2014-08-22: qty 15

## 2014-08-22 MED ORDER — ATROPINE SULFATE 1 MG/ML IJ SOLN
0.5000 mg | Freq: Once | INTRAMUSCULAR | Status: AC | PRN
  Administered 2014-08-22: 0.5 mg via INTRAVENOUS

## 2014-08-22 MED ORDER — FLUOROURACIL CHEMO INJECTION 5 GM/100ML
2400.0000 mg/m2 | INTRAVENOUS | Status: DC
  Administered 2014-08-22: 3900 mg via INTRAVENOUS
  Filled 2014-08-22: qty 78

## 2014-08-22 MED ORDER — LEUCOVORIN CALCIUM INJECTION 350 MG
400.0000 mg/m2 | Freq: Once | INTRAMUSCULAR | Status: AC
  Administered 2014-08-22: 652 mg via INTRAVENOUS
  Filled 2014-08-22: qty 32.6

## 2014-08-22 NOTE — Progress Notes (Signed)
[facility] OFFICE PROGRESS NOTE   Diagnosis: Rectal cancer  INTERVAL HISTORY:   Dr. [name] returns as scheduled. She completed another cycle of FOLFIRI on 08/08/2014. She reports 3 days of diarrhea, relieved with Imodium. The diarrhea has resolved. No mouth sores, nausea/vomiting, bleeding, or symptom of thrombosis.  Objective:  Vital signs in last 24 hours:  Blood pressure 173/75, pulse 88, temperature 98.5 F (36.9 C), temperature source Oral, resp. rate 18, height $RemoveBe'5\' 3"'QnpJmfrwN$  (1.6 m), weight 124 lb 8 oz (56.473 kg), SpO2 100 %.    HEENT: No thrush or ulcers Resp: Decreased breath sounds at the right chest, no respiratory distress Cardio: Regular rate and rhythm GI: No hepatomegaly, nontender Vascular: No leg edema   Portacath/PICC-without erythema  Lab Results:  Lab Results  Component Value Date   WBC 2.3* 08/22/2014   HGB 12.2 08/22/2014   HCT 36.8 08/22/2014   MCV 85.6 08/22/2014   PLT 211 08/22/2014   NEUTROABS 1.3* 08/22/2014     Lab Results  Component Value Date   CEA 2.0 06/01/2014    Medications: I have reviewed the patient's current medications.  Assessment/Plan: 1. Metastatic rectal cancer diagnosed July 2014.  Colonoscopy on 10/25/2012 showed a bulky circumferential friable mass lesion in the rectum at approximately 4 cm above the dentate line. The mass was subtotally obstructing and would not permit passage of the colonoscope proximal. Biopsy showed adenocarcinoma.   Staging CT scans 10/25/2012 showed a right lower lobe lung mass measuring 5 x 4.6 cm; 0.8 cm nodule right upper lobe; masslike rectosigmoid wall thickening, surrounding stranding and adjacent lymphadenopathy.   PET scan 11/02/2012 showed hypermetabolism in the area of apparent circumferential rectal wall thickening; the 5 cm right lower lobe pulmonary mass was hypermetabolic with SUV max equal 8. No other hypermetabolic lung lesions were evident. Small bilateral pulmonary  parenchymal nodules were noted.   She completed 5 cycles of FOLFOX chemotherapy 11/22/2012 through 02/13/2013.   Status post right thoracotomy, bilobectomy (right lower and middle lobectomy) with en bloc resection of the diaphragm 02/27/2013. Pathology showed adenocarcinoma consistent with metastatic colorectal adenocarcinoma, 4.3 cm, negative margins, no lymph node involvement. Negative soft tissue biopsy of the diaphragm x2. Positive for K-ras mutation-codon 13   She completed concurrent radiation and Xeloda for treatment of the rectal tumor 03/06/2013 through 04/11/2013.   Status post low anterior resection with a diverting loop ileostomy on 05/31/2013 at [facility] in [place]. Final pathology showed moderately differentiated adenocarcinoma located in the upper rectum; the tumor measured 2.5 x 2.2 cm; there was invasion through the muscularis propria into pericolic adipose tissue; tumor budding was present. Lymphatic status negative; vascular status negative; proximal and distal surgical margins negative; 2 of 17 lymph nodes showed metastatic adenocarcinoma. Mesenteric tumor deposits were present. Perineural invasion present. [facility] confirmed an additional positive lymph node and the tumor was staged as a T4a   CT abdomen/pelvis 05/12/2013. Metastatic nodules in the lung increased. Chronic loculated right base pneumothorax. Diffuse mucosal thickening of the sigmoid portion of the colon.   Restaging CTs 07/25/2013 with enlargement of bilateral lung nodules, no other evidence of metastatic disease   Ileostomy takedown 08/14/2013   Chest CT 10/02/2013 revealed enlargement of bilateral lung nodules and increased right pleural fluid   Cycle 1 of FOLFIRI/Avastin 10/03/2013   Cycle 6 of FOLFIRI/Avastin 12/12/2013   Restaging CT 12/22/2013 with a decrease in the size of bilateral lung nodules, no evidence of disease progression   Cycle 7  FOLFIRI/Avastin  12/25/2013  Cycle 8 FOLFIRI/Avastin 01/10/2014  Cycle 9 FOLFIRI/Avastin 01/24/2014  Cycle 10 FOLFIRI/Avastin 02/07/2014  Cycle of 11 FOLFIRI/Avastin 02/21/2014  Restaging CT 03/05/2014 with a decrease in the size of 2 dominant right lung nodules, no new nodules, new pleural gas collection at the right apex  Cycle 12 FOLFIRI/Avastin 03/07/2014  Cycle 13 FOLFIRI/Avastin 03/20/2014  Cycle 14 FOLFIRI/Avastin 04/04/2014  Initiation of maintenance Xeloda/Avastin 04/18/2014  CT chest 07/09/2014 revealed slight enlargement of lung nodules  Treatment with single agent Avastin 07/10/2014  Resumption of FOLFIRI/Avastin on a 2 week schedule 07/25/2014 2. Status post right VATS, drainage of pleural effusion, visceral and parietal pleural decortication 12/21/2012. Pleural fluid culture positive for viridans Streptococcus. Right pleura biopsies negative for malignancy. Findings favored empyema. 3. Diabetes. 4. Frequent bowel movements, diminished pelvic muscle tone-we made a referral to the pelvic physical therapy clinic 5. Pain in the left leg with tenderness at the left calf and left ischium-MRI of the lumbar spine and pelvis on 10/13/2011 with no evidence of metastatic disease. A right iliac bone "lesion" was felt to most likely represent heterogenous marrow. Edema and enhancement was noted at the left sciatic nerve compared to the right side with no mass lesion 6. Hypertension-on HCTZ 7. Left foot fracture 8. BRIP1 mutation     Disposition:  She has completed 2 cycles of FOLFIRI/Avastin since resuming therapy. The plan is complete 5-6 cycles prior to a restaging CT. She will use Imodium for diarrhea and knows to contact us if this does not help.  She requests a prescription for Concerta to use for attention difficulty. We prescribed the lowest dose of extended release Concerta today.  Dr. [name] will return for an office visit and chemotherapy in 2 weeks.  She has mild neutropenia  today. We discussed the risk/benefit of continuing chemotherapy. We decided to proceed with FOLFIRI. She will receive Neulasta support. We reviewed the potential toxicities associated with Neulasta. She agrees to proceed.  Betsy Coder, MD  08/22/2014  9:01 AM

## 2014-08-22 NOTE — Progress Notes (Signed)
Per Dr. Benay Spice: Proceed with treatment despite ANC 1.3. Pt to receive Neulasta with pump disconnect.

## 2014-08-22 NOTE — Patient Instructions (Addendum)
[facility] Discharge Instructions for Patients Receiving Chemotherapy  Today you received the following chemotherapy agents: Avastin, Leucovorin, Irinotecan, 5FU (push/pump)  To help prevent nausea and vomiting after your treatment, we encourage you to take your nausea medication as prescribed by your physician.  If you develop nausea and vomiting that is not controlled by your nausea medication, call the clinic.   BELOW ARE SYMPTOMS THAT SHOULD BE REPORTED IMMEDIATELY:  *FEVER GREATER THAN 100.5 F  *CHILLS WITH OR WITHOUT FEVER  NAUSEA AND VOMITING THAT IS NOT CONTROLLED WITH YOUR NAUSEA MEDICATION  *UNUSUAL SHORTNESS OF BREATH  *UNUSUAL BRUISING OR BLEEDING  TENDERNESS IN MOUTH AND THROAT WITH OR WITHOUT PRESENCE OF ULCERS  *URINARY PROBLEMS  *BOWEL PROBLEMS  UNUSUAL RASH Items with * indicate a potential emergency and should be followed up as soon as possible.  Feel free to call the clinic you have any questions or concerns. The clinic phone number is [phone].  Please show the [facility] at check-in to the Emergency Department and triage nurse.

## 2014-08-22 NOTE — Telephone Encounter (Signed)
Per staff message and POF I have scheduled appts. Advised scheduler of appts. [name]  

## 2014-08-22 NOTE — Telephone Encounter (Signed)
Pt confirmed labs/ov per 05/25 POF, gave pt AVS and Calendar.... KJ, sent msg to add chemo

## 2014-08-24 ENCOUNTER — Ambulatory Visit (HOSPITAL_BASED_OUTPATIENT_CLINIC_OR_DEPARTMENT_OTHER): Payer: 59

## 2014-08-24 VITALS — BP 154/86 | HR 77 | Temp 98.4°F

## 2014-08-24 DIAGNOSIS — C7801 Secondary malignant neoplasm of right lung: Secondary | ICD-10-CM

## 2014-08-24 DIAGNOSIS — C7802 Secondary malignant neoplasm of left lung: Secondary | ICD-10-CM

## 2014-08-24 DIAGNOSIS — C20 Malignant neoplasm of rectum: Secondary | ICD-10-CM

## 2014-08-24 DIAGNOSIS — Z5189 Encounter for other specified aftercare: Secondary | ICD-10-CM | POA: Diagnosis not present

## 2014-08-24 DIAGNOSIS — C7800 Secondary malignant neoplasm of unspecified lung: Principal | ICD-10-CM

## 2014-08-24 MED ORDER — SODIUM CHLORIDE 0.9 % IJ SOLN
10.0000 mL | INTRAMUSCULAR | Status: AC | PRN
  Administered 2014-08-24: 10 mL
  Filled 2014-08-24: qty 10

## 2014-08-24 MED ORDER — PEGFILGRASTIM INJECTION 6 MG/0.6ML ~~LOC~~
6.0000 mg | PREFILLED_SYRINGE | Freq: Once | SUBCUTANEOUS | Status: AC
  Administered 2014-08-24: 6 mg via SUBCUTANEOUS
  Filled 2014-08-24: qty 0.6

## 2014-08-24 MED ORDER — HEPARIN SOD (PORK) LOCK FLUSH 100 UNIT/ML IV SOLN
500.0000 [IU] | Freq: Once | INTRAVENOUS | Status: AC | PRN
  Administered 2014-08-24: 500 [IU]
  Filled 2014-08-24: qty 5

## 2014-08-24 NOTE — Progress Notes (Signed)
Pt was here early this AM for pump d/c and still had chemo left in pump.  Pt was instructed to return at 4:30PM for pump d/c per MD. Pt here for pump d/c at 1630, 30.1 ml left in bag.  OK to d/c per Dr. Benay Spice.

## 2014-08-29 ENCOUNTER — Telehealth: Payer: Self-pay | Admitting: Oncology

## 2014-08-29 ENCOUNTER — Other Ambulatory Visit: Payer: Self-pay | Admitting: Oncology

## 2014-08-29 DIAGNOSIS — C20 Malignant neoplasm of rectum: Secondary | ICD-10-CM

## 2014-08-29 DIAGNOSIS — C7800 Secondary malignant neoplasm of unspecified lung: Principal | ICD-10-CM

## 2014-08-29 NOTE — Telephone Encounter (Signed)
Gave and printed appt sched and avs for pt for June and JULY

## 2014-09-01 ENCOUNTER — Other Ambulatory Visit: Payer: Self-pay | Admitting: Oncology

## 2014-09-05 ENCOUNTER — Other Ambulatory Visit (HOSPITAL_BASED_OUTPATIENT_CLINIC_OR_DEPARTMENT_OTHER): Payer: 59

## 2014-09-05 ENCOUNTER — Ambulatory Visit (HOSPITAL_BASED_OUTPATIENT_CLINIC_OR_DEPARTMENT_OTHER): Payer: 59

## 2014-09-05 ENCOUNTER — Ambulatory Visit (HOSPITAL_BASED_OUTPATIENT_CLINIC_OR_DEPARTMENT_OTHER): Payer: 59 | Admitting: Nurse Practitioner

## 2014-09-05 VITALS — BP 156/81 | HR 92 | Temp 98.5°F | Resp 18 | Ht 63.0 in | Wt 121.4 lb

## 2014-09-05 VITALS — BP 120/70 | HR 75 | Temp 99.1°F | Resp 18

## 2014-09-05 DIAGNOSIS — C7802 Secondary malignant neoplasm of left lung: Secondary | ICD-10-CM | POA: Diagnosis not present

## 2014-09-05 DIAGNOSIS — I10 Essential (primary) hypertension: Secondary | ICD-10-CM

## 2014-09-05 DIAGNOSIS — R110 Nausea: Secondary | ICD-10-CM

## 2014-09-05 DIAGNOSIS — C7800 Secondary malignant neoplasm of unspecified lung: Principal | ICD-10-CM

## 2014-09-05 DIAGNOSIS — Z5111 Encounter for antineoplastic chemotherapy: Secondary | ICD-10-CM | POA: Diagnosis not present

## 2014-09-05 DIAGNOSIS — E119 Type 2 diabetes mellitus without complications: Secondary | ICD-10-CM

## 2014-09-05 DIAGNOSIS — C20 Malignant neoplasm of rectum: Secondary | ICD-10-CM

## 2014-09-05 DIAGNOSIS — Z5112 Encounter for antineoplastic immunotherapy: Secondary | ICD-10-CM | POA: Diagnosis not present

## 2014-09-05 DIAGNOSIS — C7801 Secondary malignant neoplasm of right lung: Secondary | ICD-10-CM | POA: Diagnosis not present

## 2014-09-05 LAB — COMPREHENSIVE METABOLIC PANEL (CC13)
ALBUMIN: 4.5 g/dL (ref 3.5–5.0)
ALT: 43 U/L (ref 0–55)
AST: 30 U/L (ref 5–34)
Alkaline Phosphatase: 71 U/L (ref 40–150)
Anion Gap: 11 mEq/L (ref 3–11)
BILIRUBIN TOTAL: 0.37 mg/dL (ref 0.20–1.20)
BUN: 10.4 mg/dL (ref 7.0–26.0)
CO2: 28 mEq/L (ref 22–29)
CREATININE: 0.8 mg/dL (ref 0.6–1.1)
Calcium: 10.6 mg/dL — ABNORMAL HIGH (ref 8.4–10.4)
Chloride: 102 mEq/L (ref 98–109)
EGFR: 79 mL/min/{1.73_m2} — AB (ref >90)
Glucose: 133 mg/dl (ref 70–140)
Potassium: 4 mEq/L (ref 3.5–5.1)
Sodium: 140 mEq/L (ref 136–145)
Total Protein: 7.3 g/dL (ref 6.4–8.3)

## 2014-09-05 LAB — CBC WITH DIFFERENTIAL/PLATELET
BASO%: 0.2 % (ref 0.0–2.0)
Basophils Absolute: 0 10*3/uL (ref 0.0–0.1)
EOS%: 0.2 % (ref 0.0–7.0)
Eosinophils Absolute: 0 10*3/uL (ref 0.0–0.5)
HCT: 38.6 % (ref 34.8–46.6)
HGB: 12.7 g/dL (ref 11.6–15.9)
LYMPH%: 8.7 % — AB (ref 14.0–49.7)
MCH: 28.9 pg (ref 25.1–34.0)
MCHC: 32.9 g/dL (ref 31.5–36.0)
MCV: 87.7 fL (ref 79.5–101.0)
MONO#: 0.7 10*3/uL (ref 0.1–0.9)
MONO%: 6.2 % (ref 0.0–14.0)
NEUT#: 9.2 10*3/uL — ABNORMAL HIGH (ref 1.5–6.5)
NEUT%: 84.7 % — AB (ref 38.4–76.8)
Platelets: 152 10*3/uL (ref 145–400)
RBC: 4.4 10*6/uL (ref 3.70–5.45)
RDW: 20.9 % — ABNORMAL HIGH (ref 11.2–14.5)
WBC: 10.9 10*3/uL — ABNORMAL HIGH (ref 3.9–10.3)
lymph#: 0.9 10*3/uL (ref 0.9–3.3)

## 2014-09-05 MED ORDER — ATROPINE SULFATE 1 MG/ML IJ SOLN
0.5000 mg | Freq: Once | INTRAMUSCULAR | Status: AC | PRN
  Administered 2014-09-05: 0.5 mg via INTRAVENOUS

## 2014-09-05 MED ORDER — DEXAMETHASONE SODIUM PHOSPHATE 100 MG/10ML IJ SOLN
Freq: Once | INTRAMUSCULAR | Status: AC
  Administered 2014-09-05: 11:00:00 via INTRAVENOUS
  Filled 2014-09-05: qty 8

## 2014-09-05 MED ORDER — FLUOROURACIL CHEMO INJECTION 2.5 GM/50ML
400.0000 mg/m2 | Freq: Once | INTRAVENOUS | Status: AC
  Administered 2014-09-05: 650 mg via INTRAVENOUS
  Filled 2014-09-05: qty 13

## 2014-09-05 MED ORDER — SODIUM CHLORIDE 0.9 % IV SOLN
Freq: Once | INTRAVENOUS | Status: AC
  Administered 2014-09-05: 11:00:00 via INTRAVENOUS

## 2014-09-05 MED ORDER — SODIUM CHLORIDE 0.9 % IV SOLN
5.0000 mg/kg | Freq: Once | INTRAVENOUS | Status: AC
  Administered 2014-09-05: 300 mg via INTRAVENOUS
  Filled 2014-09-05: qty 12

## 2014-09-05 MED ORDER — LORAZEPAM 2 MG/ML IJ SOLN
INTRAMUSCULAR | Status: AC
  Filled 2014-09-05: qty 1

## 2014-09-05 MED ORDER — IRINOTECAN HCL CHEMO INJECTION 100 MG/5ML
184.0000 mg/m2 | Freq: Once | INTRAVENOUS | Status: AC
  Administered 2014-09-05: 300 mg via INTRAVENOUS
  Filled 2014-09-05: qty 15

## 2014-09-05 MED ORDER — LEUCOVORIN CALCIUM INJECTION 350 MG
400.0000 mg/m2 | Freq: Once | INTRAMUSCULAR | Status: AC
  Administered 2014-09-05: 652 mg via INTRAVENOUS
  Filled 2014-09-05: qty 32.6

## 2014-09-05 MED ORDER — LORAZEPAM 2 MG/ML IJ SOLN
0.5000 mg | Freq: Once | INTRAMUSCULAR | Status: AC
  Administered 2014-09-05: 0.5 mg via INTRAVENOUS

## 2014-09-05 MED ORDER — ATROPINE SULFATE 1 MG/ML IJ SOLN
INTRAMUSCULAR | Status: AC
  Filled 2014-09-05: qty 1

## 2014-09-05 MED ORDER — SODIUM CHLORIDE 0.9 % IV SOLN
2400.0000 mg/m2 | INTRAVENOUS | Status: DC
  Administered 2014-09-05: 3900 mg via INTRAVENOUS
  Filled 2014-09-05: qty 78

## 2014-09-05 NOTE — Progress Notes (Signed)
[facility] OFFICE PROGRESS NOTE   Diagnosis:  Rectal cancer  INTERVAL HISTORY:   Dr. [name] returns as scheduled. She completed another cycle of FOLFIRI/Avastin 08/22/2014. She denies nausea/vomiting. She developed 3 ulcers at the lower lip. The ulcers have resolved. No ulcers within the mouth. She had frequent formed stools for a few days after the chemotherapy. No loose stools. She noted increased fatigue following the most recent cycle. She denies any achy pain following the Neulasta injection. She denies shortness of breath. She has occasional discomfort to the left of the sternum. She describes the discomfort as "achy". This has been occurring for many months and is not exertion related. No associated symptoms. No leg swelling or calf pain. She denies any bleeding. No abdominal pain.  Objective:  Vital signs in last 24 hours:  Blood pressure 156/81, pulse 92, temperature 98.5 F (36.9 C), temperature source Oral, resp. rate 18, height $RemoveBe'5\' 3"'yIpSzbaEG$  (1.6 m), weight 121 lb 6.4 oz (55.067 kg), SpO2 99 %.    HEENT: No thrush or ulcers. Resp: Lungs are clear. Breath sounds diminished right lower lung field. No respiratory distress. Cardio: Regular rate and rhythm. GI: Abdomen soft and nontender. No hepatomegaly. Vascular: No leg edema. Calves soft and nontender. Port-A-Cath without erythema.    Lab Results:  Lab Results  Component Value Date   WBC 10.9* 09/05/2014   HGB 12.7 09/05/2014   HCT 38.6 09/05/2014   MCV 87.7 09/05/2014   PLT 152 09/05/2014   NEUTROABS 9.2* 09/05/2014    Imaging:  No results found.  Medications: I have reviewed the patient's current medications.  Assessment/Plan: 1. Metastatic rectal cancer diagnosed July 2014.  Colonoscopy on 10/25/2012 showed a bulky circumferential friable mass lesion in the rectum at approximately 4 cm above the dentate line. The mass was subtotally obstructing and would not permit passage of the colonoscope  proximal. Biopsy showed adenocarcinoma.   Staging CT scans 10/25/2012 showed a right lower lobe lung mass measuring 5 x 4.6 cm; 0.8 cm nodule right upper lobe; masslike rectosigmoid wall thickening, surrounding stranding and adjacent lymphadenopathy.   PET scan 11/02/2012 showed hypermetabolism in the area of apparent circumferential rectal wall thickening; the 5 cm right lower lobe pulmonary mass was hypermetabolic with SUV max equal 8. No other hypermetabolic lung lesions were evident. Small bilateral pulmonary parenchymal nodules were noted.   She completed 5 cycles of FOLFOX chemotherapy 11/22/2012 through 02/13/2013.   Status post right thoracotomy, bilobectomy (right lower and middle lobectomy) with en bloc resection of the diaphragm 02/27/2013. Pathology showed adenocarcinoma consistent with metastatic colorectal adenocarcinoma, 4.3 cm, negative margins, no lymph node involvement. Negative soft tissue biopsy of the diaphragm x2. Positive for K-ras mutation-codon 13   She completed concurrent radiation and Xeloda for treatment of the rectal tumor 03/06/2013 through 04/11/2013.   Status post low anterior resection with a diverting loop ileostomy on 05/31/2013 at [facility] in [place]. Final pathology showed moderately differentiated adenocarcinoma located in the upper rectum; the tumor measured 2.5 x 2.2 cm; there was invasion through the muscularis propria into pericolic adipose tissue; tumor budding was present. Lymphatic status negative; vascular status negative; proximal and distal surgical margins negative; 2 of 17 lymph nodes showed metastatic adenocarcinoma. Mesenteric tumor deposits were present. Perineural invasion present. [facility] confirmed an additional positive lymph node and the tumor was staged as a T4a   CT abdomen/pelvis 05/12/2013. Metastatic nodules in the lung increased. Chronic loculated right base pneumothorax. Diffuse mucosal thickening of  the  sigmoid portion of the colon.   Restaging CTs 07/25/2013 with enlargement of bilateral lung nodules, no other evidence of metastatic disease   Ileostomy takedown 08/14/2013   Chest CT 10/02/2013 revealed enlargement of bilateral lung nodules and increased right pleural fluid   Cycle 1 of FOLFIRI/Avastin 10/03/2013   Cycle 6 of FOLFIRI/Avastin 12/12/2013   Restaging CT 12/22/2013 with a decrease in the size of bilateral lung nodules, no evidence of disease progression   Cycle 7 FOLFIRI/Avastin 12/25/2013  Cycle 8 FOLFIRI/Avastin 01/10/2014  Cycle 9 FOLFIRI/Avastin 01/24/2014  Cycle 10 FOLFIRI/Avastin 02/07/2014  Cycle of 11 FOLFIRI/Avastin 02/21/2014  Restaging CT 03/05/2014 with a decrease in the size of 2 dominant right lung nodules, no new nodules, new pleural gas collection at the right apex  Cycle 12 FOLFIRI/Avastin 03/07/2014  Cycle 13 FOLFIRI/Avastin 03/20/2014  Cycle 14 FOLFIRI/Avastin 04/04/2014  Initiation of maintenance Xeloda/Avastin 04/18/2014  CT chest 07/09/2014 revealed slight enlargement of lung nodules  Treatment with single agent Avastin 07/10/2014  Resumption of FOLFIRI/Avastin on a 2 week schedule 07/25/2014 2. Status post right VATS, drainage of pleural effusion, visceral and parietal pleural decortication 12/21/2012. Pleural fluid culture positive for viridans Streptococcus. Right pleura biopsies negative for malignancy. Findings favored empyema. 3. Diabetes. 4. Frequent bowel movements, diminished pelvic muscle tone-we made a referral to the pelvic physical therapy clinic 5. Pain in the left leg with tenderness at the left calf and left ischium-MRI of the lumbar spine and pelvis on 10/13/2011 with no evidence of metastatic disease. A right iliac bone "lesion" was felt to most likely represent heterogenous marrow. Edema and enhancement was noted at the left sciatic nerve compared to the right side with no mass lesion 6. Hypertension-on  HCTZ 7. Left foot fracture 8. BRIP1 mutation    Disposition: 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 Since resuming FOLFIRI/Avastin she has completed 3 cycles. Plan to proceed with cycle 4 today as scheduled. She will return for a follow-up visit and cycle 5 in 2 weeks. She will contact the office in the interim with any problems.    Ned Card ANP/GNP-BC   09/05/2014  9:47 AM

## 2014-09-05 NOTE — Patient Instructions (Signed)
[facility] Discharge Instructions for Patients Receiving Chemotherapy  Today you received the following chemotherapy agents: Avastin, Camptosar, Leucovorin and Fluorouracil.  To help prevent nausea and vomiting after your treatment, we encourage you to take your nausea medication as needed.   If you develop nausea and vomiting that is not controlled by your nausea medication, call the clinic.   BELOW ARE SYMPTOMS THAT SHOULD BE REPORTED IMMEDIATELY:  *FEVER GREATER THAN 100.5 F  *CHILLS WITH OR WITHOUT FEVER  NAUSEA AND VOMITING THAT IS NOT CONTROLLED WITH YOUR NAUSEA MEDICATION  *UNUSUAL SHORTNESS OF BREATH  *UNUSUAL BRUISING OR BLEEDING  TENDERNESS IN MOUTH AND THROAT WITH OR WITHOUT PRESENCE OF ULCERS  *URINARY PROBLEMS  *BOWEL PROBLEMS  UNUSUAL RASH Items with * indicate a potential emergency and should be followed up as soon as possible.  Feel free to call the clinic should you have any questions or concerns. The clinic phone number is [phone].  Please show the [facility] at check-in to the Emergency Department and triage nurse.

## 2014-09-06 ENCOUNTER — Telehealth: Payer: Self-pay | Admitting: Oncology

## 2014-09-06 ENCOUNTER — Other Ambulatory Visit: Payer: Self-pay | Admitting: Internal Medicine

## 2014-09-06 NOTE — Telephone Encounter (Signed)
Sent to the pharmacy by e-scribe. 

## 2014-09-06 NOTE — Telephone Encounter (Signed)
Filled on 08/13/14 for for six months.  Request too soon.

## 2014-09-07 ENCOUNTER — Ambulatory Visit: Payer: 59

## 2014-09-07 ENCOUNTER — Ambulatory Visit (HOSPITAL_BASED_OUTPATIENT_CLINIC_OR_DEPARTMENT_OTHER): Payer: 59

## 2014-09-07 VITALS — BP 133/80 | HR 102 | Temp 98.4°F | Resp 18

## 2014-09-07 DIAGNOSIS — C7802 Secondary malignant neoplasm of left lung: Secondary | ICD-10-CM | POA: Diagnosis not present

## 2014-09-07 DIAGNOSIS — C7801 Secondary malignant neoplasm of right lung: Secondary | ICD-10-CM | POA: Diagnosis not present

## 2014-09-07 DIAGNOSIS — C20 Malignant neoplasm of rectum: Secondary | ICD-10-CM

## 2014-09-07 DIAGNOSIS — Z95828 Presence of other vascular implants and grafts: Secondary | ICD-10-CM

## 2014-09-07 DIAGNOSIS — Z5189 Encounter for other specified aftercare: Secondary | ICD-10-CM

## 2014-09-07 DIAGNOSIS — C7800 Secondary malignant neoplasm of unspecified lung: Principal | ICD-10-CM

## 2014-09-07 MED ORDER — HEPARIN SOD (PORK) LOCK FLUSH 100 UNIT/ML IV SOLN
500.0000 [IU] | Freq: Once | INTRAVENOUS | Status: AC
  Administered 2014-09-07: 500 [IU] via INTRAVENOUS
  Filled 2014-09-07: qty 5

## 2014-09-07 MED ORDER — SODIUM CHLORIDE 0.9 % IJ SOLN
10.0000 mL | INTRAMUSCULAR | Status: DC | PRN
  Administered 2014-09-07: 10 mL via INTRAVENOUS
  Filled 2014-09-07: qty 10

## 2014-09-07 MED ORDER — PEGFILGRASTIM INJECTION 6 MG/0.6ML ~~LOC~~
6.0000 mg | PREFILLED_SYRINGE | Freq: Once | SUBCUTANEOUS | Status: AC
  Administered 2014-09-07: 6 mg via SUBCUTANEOUS
  Filled 2014-09-07: qty 0.6

## 2014-09-07 MED ORDER — SODIUM CHLORIDE 0.9 % IJ SOLN
10.0000 mL | INTRAMUSCULAR | Status: DC | PRN
  Filled 2014-09-07: qty 10

## 2014-09-07 MED ORDER — HEPARIN SOD (PORK) LOCK FLUSH 100 UNIT/ML IV SOLN
500.0000 [IU] | Freq: Once | INTRAVENOUS | Status: DC | PRN
  Filled 2014-09-07: qty 5

## 2014-09-07 NOTE — Patient Instructions (Signed)

## 2014-09-07 NOTE — Telephone Encounter (Signed)
S/w pt confirming inj moved after pump d/c for today's visit.... KJ

## 2014-09-07 NOTE — Progress Notes (Signed)
Neulasta injection given by flush nurse with pump DC.

## 2014-09-12 ENCOUNTER — Other Ambulatory Visit: Payer: Self-pay | Admitting: *Deleted

## 2014-09-12 ENCOUNTER — Encounter: Payer: Self-pay | Admitting: Nurse Practitioner

## 2014-09-12 ENCOUNTER — Ambulatory Visit (HOSPITAL_BASED_OUTPATIENT_CLINIC_OR_DEPARTMENT_OTHER): Payer: 59

## 2014-09-12 ENCOUNTER — Ambulatory Visit (HOSPITAL_BASED_OUTPATIENT_CLINIC_OR_DEPARTMENT_OTHER): Payer: 59 | Admitting: Nurse Practitioner

## 2014-09-12 VITALS — BP 150/81 | HR 102 | Temp 98.1°F | Resp 16

## 2014-09-12 VITALS — BP 158/79 | HR 92 | Temp 98.1°F | Resp 18 | Wt 121.7 lb

## 2014-09-12 DIAGNOSIS — R530 Neoplastic (malignant) related fatigue: Secondary | ICD-10-CM | POA: Insufficient documentation

## 2014-09-12 DIAGNOSIS — C7801 Secondary malignant neoplasm of right lung: Secondary | ICD-10-CM

## 2014-09-12 DIAGNOSIS — E860 Dehydration: Secondary | ICD-10-CM

## 2014-09-12 DIAGNOSIS — C7802 Secondary malignant neoplasm of left lung: Secondary | ICD-10-CM

## 2014-09-12 DIAGNOSIS — K1231 Oral mucositis (ulcerative) due to antineoplastic therapy: Secondary | ICD-10-CM

## 2014-09-12 DIAGNOSIS — R634 Abnormal weight loss: Secondary | ICD-10-CM | POA: Diagnosis not present

## 2014-09-12 DIAGNOSIS — K130 Diseases of lips: Secondary | ICD-10-CM

## 2014-09-12 DIAGNOSIS — C20 Malignant neoplasm of rectum: Secondary | ICD-10-CM

## 2014-09-12 DIAGNOSIS — R630 Anorexia: Secondary | ICD-10-CM | POA: Diagnosis not present

## 2014-09-12 DIAGNOSIS — C7800 Secondary malignant neoplasm of unspecified lung: Secondary | ICD-10-CM

## 2014-09-12 MED ORDER — HEPARIN SOD (PORK) LOCK FLUSH 100 UNIT/ML IV SOLN
500.0000 [IU] | Freq: Once | INTRAVENOUS | Status: AC
  Administered 2014-09-12: 500 [IU] via INTRAVENOUS
  Filled 2014-09-12: qty 5

## 2014-09-12 MED ORDER — SODIUM CHLORIDE 0.9 % IJ SOLN
10.0000 mL | INTRAMUSCULAR | Status: DC | PRN
  Administered 2014-09-12: 10 mL via INTRAVENOUS
  Filled 2014-09-12: qty 10

## 2014-09-12 MED ORDER — MAGIC MOUTHWASH: 5.0000 mL | ORAL | Status: DC | PRN

## 2014-09-12 MED ORDER — SODIUM CHLORIDE 0.9 % IV SOLN
INTRAVENOUS | Status: DC
  Administered 2014-09-12: 12:00:00 via INTRAVENOUS

## 2014-09-12 NOTE — Progress Notes (Signed)
SYMPTOM MANAGEMENT CLINIC   HPI: Anne [name] 69 y.o. female diagnosed with rectal cancer; with lung metastasis.  Currently undergoing Folfiri/Avastin chemotherapy regimen.  Patient is complaining of progressive fatigue/weakness since her last chemotherapy.  She states that she has minimal appetite and continues to lose weight as well.  She also has some mucositis.  She feels slightly dehydrated today.  Patient denies any specific nausea, vomiting, diarrhea, or constipation.  Chest denies any recent fevers or chills.   HPI  ROS  Past Medical History  Diagnosis Date  . Postmenopausal HRT (hormone replacement therapy) 07/01/2011    surgical menopause  . Hx of hyperglycemia 07/01/2011  . Hx of compression fracture of spine 1985    t12 after a  20 foot fall  . Hx of fracture of wrist     after fall   . 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 Hx of colonic polyps   . Early cataract     stoneburner   . High frequency hearing loss     [facility] followed   . Diabetes mellitus without complication   . Complication of anesthesia     pt hadpanic attacks with versed, no versed  . Status post chemotherapy     completed a total of three cycles of Folfox   . Colon cancer     rectal cancer with lung metastasis  . Skin cancer     multiple  . Heart murmur, systolic 08/30/1306    innocent per Dr. Gershon Mussel   . Asthma     as a child none since 56 years old  . Headache(784.0)   . Family history of malignant neoplasm of breast   . Family history of malignant neoplasm of ovary   . 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 Fracture of left foot     2015 trip    Past Surgical History  Procedure Laterality Date  . Tonsillectomy  1952  . Adenoidectomy  1952  . Wrist surgery  1957    left reduction  . Compression fraction  1985    T12  . Anterior cruciate  ligament repair  1993    right Murphy  . Cosmetic surgery  1999-2007    holderness blepharoplasty facial rhytidectomy mastopexy abdominoplasty   . Dental implant    . Dilation and curettage of uterus  1977  . Total abdominal hysterectomy  2005    with cystocele repair   . Colonoscopy N/A 10/24/2012    Procedure: COLONOSCOPY;  Surgeon: Irene Shipper, MD;  Location: [facility];  Service: Endoscopy;  Laterality: N/A;  . Colonoscopy N/A 10/25/2012    Procedure: COLONOSCOPY;  Surgeon: Irene Shipper, MD;  Location: [facility];  Service: Endoscopy;  Laterality: N/A;  . Video bronchoscopy Bilateral 10/27/2012    Procedure: VIDEO BRONCHOSCOPY WITH FLUORO;  Surgeon: Chesley Mires, MD;  Location: [facility] ENDOSCOPY;  Service: Endoscopy;  Laterality: Bilateral;  . Portacath placement Left 11/21/2012    Procedure: INSERTION PORT-A-CATH;  Surgeon: Melrose Nakayama, MD;  Location: [place];  Service: Thoracic;  Laterality: Left;  . Video assisted thoracoscopy Right 12/21/2012    Procedure: VIDEO ASSISTED THORACOSCOPY;  Surgeon: Melrose Nakayama, MD;  Location: [place];  Service: Thoracic;  Laterality: Right;  . Pleural effusion drainage Right 12/21/2012  Procedure: DRAINAGE OF PLEURAL EFFUSION;  Surgeon: Melrose Nakayama, MD;  Location: [place];  Service: Thoracic;  Laterality: Right;  . Decortication Right 12/21/2012    Procedure: DECORTICATION;  Surgeon: Melrose Nakayama, MD;  Location: [place];  Service: Thoracic;  Laterality: Right;  . Thorocotomy with lobectomy Right 02/27/2013    Procedure: THOROCOTOMY WITH BI- LOBECTOMY;  Surgeon: Melrose Nakayama, MD;  Location: [place];  Service: Thoracic;  Laterality: Right;    has Heart murmur, systolic; Chest pain, unspecified; Postmenopausal HRT (hormone replacement therapy); Hx of hyperglycemia; High frequency hearing loss; Hx of colonic polyps; Early cataract; Bright red blood per rectum; DM type 2 (diabetes mellitus, type 2); Lung mass; Rectal cancer  metastasized to lung; Anemia; Elevated blood pressure reading; S/P thoracotomy; Left upper arm pain; Shortness of breath; Abdominal pain; Family history of malignant neoplasm of breast; Family history of malignant neoplasm of ovary; 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    is allergic to ativan; midazolam; zolpidem tartrate; bacitracin; moviprep; and neomycin-bacitracin zn-polymyx.    Medication List       This list is accurate as of: 09/12/14 11:49 AM.  Always use your most recent med list.               ACCU-CHEK AVIVA PLUS test strip  Generic drug:  glucose blood  CHECK BLOOD GLUCOSE (SUGAR) TWICE DAILY     ACCU-CHEK AVIVA PLUS W/DEVICE Kit  AS DIRECTED     CALCIUM 600 PO  Take 1 tablet by mouth daily.     hydrochlorothiazide 12.5 MG capsule  Commonly known as:  MICROZIDE  Take 1 capsule (12.5 mg total) by mouth daily.     JANUVIA 100 MG tablet  Generic drug:  sitaGLIPtin  TAKE 1 TABLET BY MOUTH ONCE DAILY     magic mouthwash Soln  Take 5-10 mLs by mouth every 4 (four) hours as needed for mouth pain.     metFORMIN 1000 MG tablet  Commonly known as:  GLUCOPHAGE  TAKE ONE TABLET TWICE DAILY WITH A MEAL     methylphenidate 18 MG CR tablet  Commonly known as:  CONCERTA  Take 1 tablet (18 mg total) by mouth daily.     methylTESTOSTERone 10 MG capsule  Commonly known as:  ANDROID  Take 10 mg by mouth.     MINIVELLE 0.0375 MG/24HR  Generic drug:  estradiol  Place 1 patch onto the skin 2 (two) times a week. Sundays and Thursdays     multivitamin tablet  Take 1 tablet by mouth daily. Senior     OVER THE COUNTER MEDICATION  Place 1 drop into both ears 2 (two) times a week. Ear drops to decrease wax build up       oxyCODONE-acetaminophen 10-325 MG per tablet  Commonly known as:  PERCOCET  Take 1 tablet by mouth every 4 (four) hours as needed for pain.     psyllium 58.6 % powder  Commonly known as:  METAMUCIL  Take 1 packet by mouth daily.         PHYSICAL EXAMINATION  Oncology Vitals 09/12/2014 09/07/2014 09/05/2014 09/05/2014 08/24/2014 08/24/2014 08/22/2014  Height - - - 160 cm - - -  Weight 55.203 kg - - 55.067 kg - - -  Weight (lbs) 121 lbs 11 oz - - 121  lbs 6 oz - - -  BMI (kg/m2) - - - 21.51 kg/m2 - - -  Temp 98.1 98.4 99.1 98.5 98 98.4 -  Pulse 92 102 75 92 89 77 80  Resp $Rem'18 18 18 18 'bVlI$ - - -  SpO2 98 99 - 99 - - -  BSA (m2) - - - 1.56 m2 - - -   BP Readings from Last 3 Encounters:  09/12/14 158/79  09/07/14 133/80  09/05/14 120/70    Physical Exam  Constitutional: She is oriented to person, place, and time.  Patient appears fatigued, mildly weak, frail, and chronically ill.  HENT:  Head: Normocephalic and atraumatic.  Patient has a large lesion to the inside lower lip; as well as some irritation and sensitivity to her oral mucosa.  No obvious thrush noted.  Eyes: Conjunctivae and EOM are normal. Pupils are equal, round, and reactive to light. Right eye exhibits no discharge. Left eye exhibits no discharge. No scleral icterus.  Neck: Normal range of motion.  Pulmonary/Chest: Effort normal. No respiratory distress.  Musculoskeletal: Normal range of motion.  Neurological: She is alert and oriented to person, place, and time. Gait normal.  Psychiatric: Affect normal.  Nursing note and vitals reviewed.   LABORATORY DATA:. No visits with results within 3 Day(s) from this visit. Latest known visit with results is:  Appointment on 09/05/2014  Component Date Value Ref Range Status  . WBC 09/05/2014 10.9* 3.9 - 10.3 10e3/uL Final  . NEUT# 09/05/2014 9.2* 1.5 - 6.5 10e3/uL Final  . HGB 09/05/2014 12.7  11.6 - 15.9 g/dL Final  . HCT 09/05/2014 38.6  34.8 - 46.6 % Final  . Platelets  09/05/2014 152  145 - 400 10e3/uL Final  . MCV 09/05/2014 87.7  79.5 - 101.0 fL Final  . MCH 09/05/2014 28.9  25.1 - 34.0 pg Final  . MCHC 09/05/2014 32.9  31.5 - 36.0 g/dL Final  . RBC 09/05/2014 4.40  3.70 - 5.45 10e6/uL Final  . RDW 09/05/2014 20.9* 11.2 - 14.5 % Final  . lymph# 09/05/2014 0.9  0.9 - 3.3 10e3/uL Final  . MONO# 09/05/2014 0.7  0.1 - 0.9 10e3/uL Final  . Eosinophils Absolute 09/05/2014 0.0  0.0 - 0.5 10e3/uL Final  . Basophils Absolute 09/05/2014 0.0  0.0 - 0.1 10e3/uL Final  . NEUT% 09/05/2014 84.7* 38.4 - 76.8 % Final  . LYMPH% 09/05/2014 8.7* 14.0 - 49.7 % Final  . MONO% 09/05/2014 6.2  0.0 - 14.0 % Final  . EOS% 09/05/2014 0.2  0.0 - 7.0 % Final  . BASO% 09/05/2014 0.2  0.0 - 2.0 % Final  . Sodium 09/05/2014 140  136 - 145 mEq/L Final  . Potassium 09/05/2014 4.0  3.5 - 5.1 mEq/L Final  . Chloride 09/05/2014 102  98 - 109 mEq/L Final  . CO2 09/05/2014 28  22 - 29 mEq/L Final  . Glucose 09/05/2014 133  70 - 140 mg/dl Final  . BUN 09/05/2014 10.4  7.0 - 26.0 mg/dL Final  . Creatinine 09/05/2014 0.8  0.6 - 1.1 mg/dL Final  . Total Bilirubin 09/05/2014 0.37  0.20 - 1.20 mg/dL Final  . Alkaline Phosphatase 09/05/2014 71  40 - 150 U/L Final  . AST 09/05/2014 30  5 - 34 U/L Final  . ALT 09/05/2014 43  0 - 55 U/L Final  . Total Protein 09/05/2014 7.3  6.4 - 8.3 g/dL Final  . Albumin 09/05/2014 4.5  3.5 - 5.0 g/dL Final  . Calcium 09/05/2014 10.6*  8.4 - 10.4 mg/dL Final  . Anion Gap 09/05/2014 11  3 - 11 mEq/L Final  . EGFR 09/05/2014 79* >90 ml/min/1.73 m2 Final   eGFR is calculated using the CKD-EPI Creatinine Equation (2009)     RADIOGRAPHIC STUDIES: No results found.  ASSESSMENT/PLAN:    Rectal cancer metastasized to lung Patient initiated cycle 4 of her Folfiri/Avastin chemotherapy on 09/05/2014.  She had her chemotherapy pump discontinued on 09/07/2014.  Patient is scheduled to return on 09/19/2014 for labs, follow up visit, and her next cycle of  chemotherapy.  Anorexia Patient is complaining of minimal appetite; and continues to lose weight.  Patient states that she tries to eat a high-protein diet as much as possible; and continues to try pushing fluids.  Patient was encouraged to eat multiple small meals throughout the day.  Will order a nutrition referral for patient.  Weight loss Patient is complaining of minimal appetite; and continues to lose weight.  Patient states that she tries to eat a high-protein diet as much as possible; and continues to try pushing fluids.  Patient was encouraged to eat multiple small meals throughout the day.  Will order a nutrition referral for patient.  Neoplastic malignant related fatigue Patient is complaining of progressive fatigue/weakness since her last chemotherapy.  Advised patient that her progressive fatigue and weakness is most likely a cumulative effect of the chemotherapy.  However, patient may also be experiencing some mild dehydration; since she is experiencing some mucositis issues today.  Will give patient IV fluid rehydration today to see if that helps.  Mucositis due to chemotherapy Patient is complaining of some mucositis since her last chemotherapy.  On exam.-Patient has a large lesion to the inside of her lower lip; as well as some irritation and sensitivity to her oral mucosa.  Patient was advised to try Orajel over-the-counter; as well as baking soda/salt swish and spit.  She was also prescribed Magic mouthwash as well.    Dehydration Patient is complaining of progressive fatigue/weakness since her last chemotherapy.  Advised patient that her progressive fatigue and weakness is most likely a cumulative effect of the chemotherapy.  However, patient may also be experiencing some mild dehydration; since she is experiencing some mucositis issues today.  Will give patient IV fluid rehydration today to see if that helps.  Patient was encouraged to push fluids at home is  much as possible.  Patient stated understanding of all instructions; and was in agreement with this plan of care. The patient knows to call the clinic with any problems, questions or concerns.   Review/collaboration with Dr. Benay Spice regarding all aspects of patient's visit today.   Total time spent with patient was 25 minutes;  with greater than 75 percent of that time spent in face to face counseling regarding patient's symptoms,  and coordination of care and follow up.  Disclaimer: This note was dictated with voice recognition software. Similar sounding words can inadvertently be transcribed and may not be corrected upon review.   Drue Second, NP 09/12/2014

## 2014-09-12 NOTE — Assessment & Plan Note (Signed)
Patient is complaining of minimal appetite; and continues to lose weight.  Patient states that she tries to eat a high-protein diet as much as possible; and continues to try pushing fluids.  Patient was encouraged to eat multiple small meals throughout the day.  Will order a nutrition referral for patient.

## 2014-09-12 NOTE — Assessment & Plan Note (Signed)
Patient is complaining of some mucositis since her last chemotherapy.  On exam.-Patient has a large lesion to the inside of her lower lip; as well as some irritation and sensitivity to her oral mucosa.  Patient was advised to try Orajel over-the-counter; as well as baking soda/salt swish and spit.  She was also prescribed Magic mouthwash as well.

## 2014-09-12 NOTE — Assessment & Plan Note (Signed)
Patient initiated cycle 4 of her Folfiri/Avastin chemotherapy on 09/05/2014.  She had her chemotherapy pump discontinued on 09/07/2014.  Patient is scheduled to return on 09/19/2014 for labs, follow up visit, and her next cycle of chemotherapy.

## 2014-09-12 NOTE — Assessment & Plan Note (Signed)
Patient is complaining of progressive fatigue/weakness since her last chemotherapy.  Advised patient that her progressive fatigue and weakness is most likely a cumulative effect of the chemotherapy.  However, patient may also be experiencing some mild dehydration; since she is experiencing some mucositis issues today.  Will give patient IV fluid rehydration today to see if that helps.  Patient was encouraged to push fluids at home is much as possible.

## 2014-09-12 NOTE — Patient Instructions (Signed)
Dehydration, Adult Dehydration is when you lose more fluids from the body than you take in. Vital organs like the kidneys, brain, and heart cannot function without a proper amount of fluids and salt. Any loss of fluids from the body can cause dehydration.  CAUSES   Vomiting.  Diarrhea.  Excessive sweating.  Excessive urine output.  Fever. SYMPTOMS  Mild dehydration  Thirst.  Dry lips.  Slightly dry mouth. Moderate dehydration  Very dry mouth.  Sunken eyes.  Skin does not bounce back quickly when lightly pinched and released.  Dark urine and decreased urine production.  Decreased tear production.  Headache. Severe dehydration  Very dry mouth.  Extreme thirst.  Rapid, weak pulse (more than 100 beats per minute at rest).  Cold hands and feet.  Not able to sweat in spite of heat and temperature.  Rapid breathing.  Blue lips.  Confusion and lethargy.  Difficulty being awakened.  Minimal urine production.  No tears. DIAGNOSIS  Your caregiver will diagnose dehydration based on your symptoms and your exam. Blood and urine tests will help confirm the diagnosis. The diagnostic evaluation should also identify the cause of dehydration. TREATMENT  Treatment of mild or moderate dehydration can often be done at home by increasing the amount of fluids that you drink. It is best to drink small amounts of fluid more often. Drinking too much at one time can make vomiting worse. Refer to the home care instructions below. Severe dehydration needs to be treated at the hospital where you will probably be given intravenous (IV) fluids that contain water and electrolytes. HOME CARE INSTRUCTIONS   Ask your caregiver about specific rehydration instructions.  Drink enough fluids to keep your urine clear or pale yellow.  Drink small amounts frequently if you have nausea and vomiting.  Eat as you normally do.  Avoid:  Foods or drinks high in sugar.  Carbonated  drinks.  Juice.  Extremely hot or cold fluids.  Drinks with caffeine.  Fatty, greasy foods.  Alcohol.  Tobacco.  Overeating.  Gelatin desserts.  Wash your hands well to avoid spreading bacteria and viruses.  Only take over-the-counter or prescription medicines for pain, discomfort, or fever as directed by your caregiver.  Ask your caregiver if you should continue all prescribed and over-the-counter medicines.  Keep all follow-up appointments with your caregiver. SEEK MEDICAL CARE IF:  You have abdominal pain and it increases or stays in one area (localizes).  You have a rash, stiff neck, or severe headache.  You are irritable, sleepy, or difficult to awaken.  You are weak, dizzy, or extremely thirsty. SEEK IMMEDIATE MEDICAL CARE IF:   You are unable to keep fluids down or you get worse despite treatment.  You have frequent episodes of vomiting or diarrhea.  You have blood or green matter (bile) in your vomit.  You have blood in your stool or your stool looks black and tarry.  You have not urinated in 6 to 8 hours, or you have only urinated a small amount of very dark urine.  You have a fever.  You faint. MAKE SURE YOU:   Understand these instructions.  Will watch your condition.  Will get help right away if you are not doing well or get worse. Document Released: 03/16/2005 Document Revised: 06/08/2011 Document Reviewed: 11/03/2010 Exit[employer] Patient Information 2015 [employer]. This information is not intended to replace advice given to you by your health care provider. Make sure you discuss any questions you have with your health care   provider.  

## 2014-09-12 NOTE — Assessment & Plan Note (Signed)
Patient is complaining of progressive fatigue/weakness since her last chemotherapy.  Advised patient that her progressive fatigue and weakness is most likely a cumulative effect of the chemotherapy.  However, patient may also be experiencing some mild dehydration; since she is experiencing some mucositis issues today.  Will give patient IV fluid rehydration today to see if that helps.

## 2014-09-14 ENCOUNTER — Ambulatory Visit (HOSPITAL_BASED_OUTPATIENT_CLINIC_OR_DEPARTMENT_OTHER): Payer: 59 | Admitting: Oncology

## 2014-09-14 ENCOUNTER — Other Ambulatory Visit: Payer: Self-pay | Admitting: *Deleted

## 2014-09-14 ENCOUNTER — Encounter (HOSPITAL_COMMUNITY): Payer: Self-pay

## 2014-09-14 ENCOUNTER — Other Ambulatory Visit: Payer: 59

## 2014-09-14 ENCOUNTER — Ambulatory Visit (HOSPITAL_COMMUNITY)
Admission: RE | Admit: 2014-09-14 | Discharge: 2014-09-14 | Disposition: A | Discharge status: Home / Self Care | Payer: 59 | Source: Ambulatory Visit | Attending: Oncology | Admitting: Oncology

## 2014-09-14 ENCOUNTER — Other Ambulatory Visit (HOSPITAL_BASED_OUTPATIENT_CLINIC_OR_DEPARTMENT_OTHER): Payer: 59 | Admitting: Oncology

## 2014-09-14 VITALS — BP 156/77 | HR 84 | Temp 98.0°F | Resp 22 | Wt 120.4 lb

## 2014-09-14 DIAGNOSIS — R0602 Shortness of breath: Secondary | ICD-10-CM | POA: Insufficient documentation

## 2014-09-14 DIAGNOSIS — Z79899 Other long term (current) drug therapy: Secondary | ICD-10-CM | POA: Diagnosis not present

## 2014-09-14 DIAGNOSIS — C7800 Secondary malignant neoplasm of unspecified lung: Principal | ICD-10-CM

## 2014-09-14 DIAGNOSIS — C7802 Secondary malignant neoplasm of left lung: Secondary | ICD-10-CM

## 2014-09-14 DIAGNOSIS — C20 Malignant neoplasm of rectum: Secondary | ICD-10-CM | POA: Diagnosis present

## 2014-09-14 DIAGNOSIS — I10 Essential (primary) hypertension: Secondary | ICD-10-CM

## 2014-09-14 DIAGNOSIS — C7801 Secondary malignant neoplasm of right lung: Secondary | ICD-10-CM | POA: Diagnosis not present

## 2014-09-14 DIAGNOSIS — R0609 Other forms of dyspnea: Secondary | ICD-10-CM

## 2014-09-14 DIAGNOSIS — R630 Anorexia: Secondary | ICD-10-CM | POA: Diagnosis not present

## 2014-09-14 DIAGNOSIS — E119 Type 2 diabetes mellitus without complications: Secondary | ICD-10-CM

## 2014-09-14 DIAGNOSIS — R5381 Other malaise: Secondary | ICD-10-CM

## 2014-09-14 LAB — CBC WITH DIFFERENTIAL/PLATELET
BASO%: 0.4 % (ref 0.0–2.0)
Basophils Absolute: 0.1 10*3/uL (ref 0.0–0.1)
EOS ABS: 0.1 10*3/uL (ref 0.0–0.5)
EOS%: 0.4 % (ref 0.0–7.0)
HEMATOCRIT: 34.9 % (ref 34.8–46.6)
HGB: 11.7 g/dL (ref 11.6–15.9)
LYMPH%: 6.1 % — AB (ref 14.0–49.7)
MCH: 29 pg (ref 25.1–34.0)
MCHC: 33.5 g/dL (ref 31.5–36.0)
MCV: 86.6 fL (ref 79.5–101.0)
MONO#: 1 10*3/uL — AB (ref 0.1–0.9)
MONO%: 6.8 % (ref 0.0–14.0)
NEUT%: 86.3 % — ABNORMAL HIGH (ref 38.4–76.8)
NEUTROS ABS: 12.3 10*3/uL — AB (ref 1.5–6.5)
PLATELETS: 181 10*3/uL (ref 145–400)
RBC: 4.03 10*6/uL (ref 3.70–5.45)
RDW: 20.8 % — ABNORMAL HIGH (ref 11.2–14.5)
WBC: 14.2 10*3/uL — ABNORMAL HIGH (ref 3.9–10.3)
lymph#: 0.9 10*3/uL (ref 0.9–3.3)
nRBC: 0 % (ref 0–0)

## 2014-09-14 LAB — COMPREHENSIVE METABOLIC PANEL (CC13)
ALBUMIN: 4.4 g/dL (ref 3.5–5.0)
ALK PHOS: 107 U/L (ref 40–150)
ALT: 78 U/L — ABNORMAL HIGH (ref 0–55)
AST: 47 U/L — ABNORMAL HIGH (ref 5–34)
Anion Gap: 11 mEq/L (ref 3–11)
BILIRUBIN TOTAL: 0.46 mg/dL (ref 0.20–1.20)
BUN: 8.2 mg/dL (ref 7.0–26.0)
CO2: 26 mEq/L (ref 22–29)
Calcium: 10.3 mg/dL (ref 8.4–10.4)
Chloride: 102 mEq/L (ref 98–109)
Creatinine: 0.7 mg/dL (ref 0.6–1.1)
EGFR: 88 mL/min/{1.73_m2} — ABNORMAL LOW (ref >90)
Glucose: 142 mg/dl — ABNORMAL HIGH (ref 70–140)
POTASSIUM: 3.5 meq/L (ref 3.5–5.1)
Sodium: 139 mEq/L (ref 136–145)
TOTAL PROTEIN: 6.9 g/dL (ref 6.4–8.3)

## 2014-09-14 MED ORDER — SODIUM CHLORIDE 0.9 % IJ SOLN
10.0000 mL | INTRAMUSCULAR | Status: DC | PRN
  Administered 2014-09-14: 10 mL via INTRAVENOUS
  Filled 2014-09-14: qty 10

## 2014-09-14 MED ORDER — IOHEXOL 350 MG/ML SOLN
100.0000 mL | Freq: Once | INTRAVENOUS | Status: AC | PRN
  Administered 2014-09-14: 80 mL via INTRAVENOUS

## 2014-09-14 MED ORDER — HEPARIN SOD (PORK) LOCK FLUSH 100 UNIT/ML IV SOLN
500.0000 [IU] | Freq: Once | INTRAVENOUS | Status: AC
  Administered 2014-09-14: 500 [IU] via INTRAVENOUS
  Filled 2014-09-14: qty 5

## 2014-09-14 NOTE — Progress Notes (Signed)
TC to pt- she is feeling better today but still having bouts of "racing heartbeats". Pt is waiting to be seen by Dr. Benay Spice while we were on the phone. Pt understands to call clinic with any concerns or questions.

## 2014-09-14 NOTE — Progress Notes (Signed)
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 She completed another cycle of FOLFIRI/Avastin on 09/05/2014. She reports increased malaise over the past 2 weeks. She has exertional dyspnea and has noted tachycardia. Mild intermittent left anterior chest discomfort has been present for months. No fever. No diarrhea. She developed soreness at the mouth with an ulcer at the lower inner lip.  Objective:  Vital signs in last 24 hours:  Blood pressure 156/77, pulse 84, temperature 98 F (36.7 C), temperature source Oral, resp. rate 22, weight 120 lb 6.4 oz (54.613 kg), SpO2 100 %.    HEENT: Healing ulceration at the mid lower inner lip, no thrush Resp: Decreased breath sounds at the right lower chest, no respiratory distress Cardio: Regular rate and rhythm GI: No hepatosplenomegaly, nontender Vascular: No leg edema  Portacath/PICC-without erythema  Lab Results:  Lab Results  Component Value Date   WBC 14.2* 09/14/2014   HGB 11.7 09/14/2014   HCT 34.9 09/14/2014   MCV 86.6 09/14/2014   PLT 181 09/14/2014   NEUTROABS 12.3* 09/14/2014      Lab Results  Component Value Date   CEA 2.0 06/01/2014    Imaging:  Ct Angio Chest Pe W/cm &/or Wo Cm  09/14/2014   CLINICAL DATA:  68 year old female with history of rectal cancer currently undergoing chemotherapy. Patient has shortness of breath.  EXAM: CT ANGIOGRAPHY CHEST WITH CONTRAST  TECHNIQUE: Multidetector CT imaging of the chest was performed using the standard protocol during bolus administration of intravenous contrast. Multiplanar CT image reconstructions and MIPs were obtained to evaluate the vascular anatomy.  CONTRAST:  72mL OMNIPAQUE IOHEXOL 350 MG/ML SOLN  COMPARISON:  Most recent prior chest CT 07/09/2014  FINDINGS: Mediastinum: Unremarkable CT appearance of the thyroid gland. No suspicious mediastinal or hilar adenopathy. No  soft tissue mediastinal mass. The thoracic esophagus is unremarkable.  Heart/Vascular: Adequate opacification of the pulmonary arteries to the proximal subsegmental level. No evidence of central filling defect to suggest acute pulmonary embolus. Tortuous but normal caliber thoracic aorta. No aneurysm or dissection. Cardiac contours are within normal limits for size. No pericardial effusion. Left subclavian approach port catheter remains in good position with the tip at the mid to distal SVC.  Lungs/Pleura: Surgical changes of prior right lower lobectomy. Associated elevation of the right hemidiaphragm. Macro lobulated nodule in the medial aspect of the right upper lobe may be slightly larger at 15 x 14 mm compared to 15 x 13 mm previously. Small adjacent satellite nodules are also slightly more full in appearance with a larger measuring 5 mm compared to 4 mm previously. The ovoid nodule in the more inferior aspect of the right upper lobe measures unchanged at 19 x 13 mm. Small adjacent satellite nodules are also insignificantly changed. Stable 7 mm nodule in the left upper lobe along numb major fissure. Stable 9 mm nodule in the left lower lobe (image 56 series 7). A 10 mm nodule in the anterior aspect of the left lower lobe is slightly more prominent than previously noted at 9 mm (image 76 series 7). Mild paraseptal emphysema on the right.  The lungs are otherwise clear. No evidence of focal airspace consolidation, pulmonary edema, pleural effusion or pneumothorax.  Bones/Soft Tissues: Stable T12 compression fracture. Multilevel degenerative disc disease and endplate spurring. No acute fracture or aggressive appearing lytic or blastic osseous lesion.  Upper Abdomen: Visualized upper abdominal organs are  unremarkable.  Review of the MIP images confirms the above findings.  IMPRESSION: 1. No evidence of acute pulmonary embolus, pneumonia or other acute cardiopulmonary process. 2. Essentially stable multifocal  pulmonary metastatic disease. Many of the nodules measure exactly the same as seen on 07/09/2014 while some subjectively appear more full/solid and several measure up to 1 mm larger. These very small differences in size and appearance may be within measurement and slice selection error. Recommend continued attention on follow-up imaging. 3. Stable T12 compression fracture.   Electronically Signed   By: Jacqulynn Cadet M.D.   On: 09/14/2014 13:42    Medications: I have reviewed the patient's current medications.  Assessment/Plan: 1. Metastatic rectal cancer diagnosed July 2014.  Colonoscopy on 10/25/2012 showed a bulky circumferential friable mass lesion in the rectum at approximately 4 cm above the dentate line. The mass was subtotally obstructing and would not permit passage of the colonoscope proximal. Biopsy showed adenocarcinoma.   Staging CT scans 10/25/2012 showed a right lower lobe lung mass measuring 5 x 4.6 cm; 0.8 cm nodule right upper lobe; masslike rectosigmoid wall thickening, surrounding stranding and adjacent lymphadenopathy.   PET scan 11/02/2012 showed hypermetabolism in the area of apparent circumferential rectal wall thickening; the 5 cm right lower lobe pulmonary mass was hypermetabolic with SUV max equal 8. No other hypermetabolic lung lesions were evident. Small bilateral pulmonary parenchymal nodules were noted.   She completed 5 cycles of FOLFOX chemotherapy 11/22/2012 through 02/13/2013.   Status post right thoracotomy, bilobectomy (right lower and middle lobectomy) with en bloc resection of the diaphragm 02/27/2013. Pathology showed adenocarcinoma consistent with metastatic colorectal adenocarcinoma, 4.3 cm, negative margins, no lymph node involvement. Negative soft tissue biopsy of the diaphragm x2. Positive for K-ras mutation-codon 13   She completed concurrent radiation and Xeloda for treatment of the rectal tumor 03/06/2013 through 04/11/2013.   Status post  low anterior resection with a diverting loop ileostomy on 05/31/2013 at [facility] in [place]. Final pathology showed moderately differentiated adenocarcinoma located in the upper rectum; the tumor measured 2.5 x 2.2 cm; there was invasion through the muscularis propria into pericolic adipose tissue; tumor budding was present. Lymphatic status negative; vascular status negative; proximal and distal surgical margins negative; 2 of 17 lymph nodes showed metastatic adenocarcinoma. Mesenteric tumor deposits were present. Perineural invasion present. [facility] an additional positive lymph node and the tumor was staged as a T4a   CT abdomen/pelvis 05/12/2013. Metastatic nodules in the lung increased. Chronic loculated right base pneumothorax. Diffuse mucosal thickening of the sigmoid portion of the colon.   Restaging CTs 07/25/2013 with enlargement of bilateral lung nodules, no other evidence of metastatic disease   Ileostomy takedown 08/14/2013   Chest CT 10/02/2013 revealed enlargement of bilateral lung nodules and increased right pleural fluid   Cycle 1 of FOLFIRI/Avastin 10/03/2013   Cycle 6 of FOLFIRI/Avastin 12/12/2013   Restaging CT 12/22/2013 with a decrease in the size of bilateral lung nodules, no evidence of disease progression   Cycle 7 FOLFIRI/Avastin 12/25/2013  Cycle 8 FOLFIRI/Avastin 01/10/2014  Cycle 9 FOLFIRI/Avastin 01/24/2014  Cycle 10 FOLFIRI/Avastin 02/07/2014  Cycle of 11 FOLFIRI/Avastin 02/21/2014  Restaging CT 03/05/2014 with a decrease in the size of 2 dominant right lung nodules, no new nodules, new pleural gas collection at the right apex  Cycle 12 FOLFIRI/Avastin 03/07/2014  Cycle 13 FOLFIRI/Avastin 03/20/2014  Cycle 14 FOLFIRI/Avastin 04/04/2014  Initiation of maintenance Xeloda/Avastin 04/18/2014  CT chest 07/09/2014 revealed slight enlargement of lung nodules  Treatment with single agent Avastin  07/10/2014  Resumption of FOLFIRI/Avastin on a 2 week schedule 07/25/2014  CT chest 09/14/2014 with stable lung nodules 2. Status post right VATS, drainage of pleural effusion, visceral and parietal pleural decortication 12/21/2012. Pleural fluid culture positive for viridans Streptococcus. Right pleura biopsies negative for malignancy. Findings favored empyema. 3. Diabetes. 4. Frequent bowel movements, diminished pelvic muscle tone-we made a referral to the pelvic physical therapy clinic 5. Pain in the left leg with tenderness at the left calf and left ischium-MRI of the lumbar spine and pelvis on 10/13/2011 with no evidence of metastatic disease. A right iliac bone "lesion" was felt to most likely represent heterogenous marrow. Edema and enhancement was noted at the left sciatic nerve compared to the right side with no mass lesion 6. Hypertension-on HCTZ 7. History of a Left foot fracture 8. BRIP1 mutation 9. 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 She has developed malaise, anorexia, and exertional dyspnea over the past few weeks. I was concerned her symptoms could be related to a cardiopulmonary process such as a pulmonary embolism, pericardial/pleural effusion, or tumor progression in the chest. A chest CT reveals no explanation for her symptoms. The lung nodules are essentially unchanged. The exertional dyspnea may be related to deconditioning in addition to chronic lung disease following chest surgery.  She reports feeling better after drinking fluids in the office today. She had mild exertional dyspnea when ambulating in the hall at the [facility] earlier today. The oxygen saturation is adequate. She plans to push fluids over the next several days. She will contact us for increased dyspnea. She will return for an office visit as scheduled on 09/19/2014. The plan is to continue FOLFIRI/Avastin unless there is clear evidence of  disease progression or treatment-related toxicity.  Approximate 40 minutes were spent with the patient today. The majority of the time was used for counseling and coordination of care.  Betsy Coder, MD  09/14/2014  2:33 PM

## 2014-09-14 NOTE — Patient Outreach (Signed)
High cost referral related to diagnosis of rectal cancer with metastasis to the lungs July 2014. Currently receiving  FOLFIRI/Avastin every 2 weeks at the [facility] ; most recent cycle completed on 09/05/14. Unsuccessful outreach call to [facility] member to assess for care management needs, will attempt to reach member next week. Barrington Ellison RN,CCM,CDE [place] Management Coordinator [facility] Office Phone [phone] Office Fax [phone]

## 2014-09-16 ENCOUNTER — Other Ambulatory Visit: Payer: Self-pay | Admitting: Oncology

## 2014-09-19 ENCOUNTER — Ambulatory Visit: Payer: Medicare Other

## 2014-09-19 ENCOUNTER — Encounter: Payer: Medicare Other | Admitting: Nutrition

## 2014-09-19 ENCOUNTER — Other Ambulatory Visit (HOSPITAL_BASED_OUTPATIENT_CLINIC_OR_DEPARTMENT_OTHER): Payer: 59

## 2014-09-19 ENCOUNTER — Ambulatory Visit (HOSPITAL_BASED_OUTPATIENT_CLINIC_OR_DEPARTMENT_OTHER): Payer: Medicare Other | Admitting: Nurse Practitioner

## 2014-09-19 ENCOUNTER — Ambulatory Visit (HOSPITAL_BASED_OUTPATIENT_CLINIC_OR_DEPARTMENT_OTHER): Payer: 59

## 2014-09-19 VITALS — BP 167/76 | HR 90 | Temp 98.4°F | Resp 18 | Ht 63.0 in | Wt 123.5 lb

## 2014-09-19 VITALS — BP 130/76 | HR 100 | Resp 18

## 2014-09-19 DIAGNOSIS — E119 Type 2 diabetes mellitus without complications: Secondary | ICD-10-CM

## 2014-09-19 DIAGNOSIS — C7802 Secondary malignant neoplasm of left lung: Secondary | ICD-10-CM | POA: Diagnosis not present

## 2014-09-19 DIAGNOSIS — R000 Tachycardia, unspecified: Secondary | ICD-10-CM

## 2014-09-19 DIAGNOSIS — I10 Essential (primary) hypertension: Secondary | ICD-10-CM

## 2014-09-19 DIAGNOSIS — C20 Malignant neoplasm of rectum: Secondary | ICD-10-CM

## 2014-09-19 DIAGNOSIS — Z5112 Encounter for antineoplastic immunotherapy: Secondary | ICD-10-CM

## 2014-09-19 DIAGNOSIS — R0609 Other forms of dyspnea: Secondary | ICD-10-CM | POA: Diagnosis not present

## 2014-09-19 DIAGNOSIS — C7801 Secondary malignant neoplasm of right lung: Secondary | ICD-10-CM

## 2014-09-19 DIAGNOSIS — K1230 Oral mucositis (ulcerative), unspecified: Secondary | ICD-10-CM

## 2014-09-19 DIAGNOSIS — Z5111 Encounter for antineoplastic chemotherapy: Secondary | ICD-10-CM | POA: Diagnosis not present

## 2014-09-19 DIAGNOSIS — Z95828 Presence of other vascular implants and grafts: Secondary | ICD-10-CM

## 2014-09-19 DIAGNOSIS — C7800 Secondary malignant neoplasm of unspecified lung: Principal | ICD-10-CM

## 2014-09-19 LAB — COMPREHENSIVE METABOLIC PANEL (CC13)
ALT: 72 U/L — AB (ref 0–55)
ANION GAP: 9 meq/L (ref 3–11)
AST: 33 U/L (ref 5–34)
Albumin: 4.3 g/dL (ref 3.5–5.0)
Alkaline Phosphatase: 88 U/L (ref 40–150)
BILIRUBIN TOTAL: 0.4 mg/dL (ref 0.20–1.20)
BUN: 19.7 mg/dL (ref 7.0–26.0)
CO2: 28 meq/L (ref 22–29)
Calcium: 10 mg/dL (ref 8.4–10.4)
Chloride: 104 mEq/L (ref 98–109)
Creatinine: 0.7 mg/dL (ref 0.6–1.1)
EGFR: 88 mL/min/{1.73_m2} — AB (ref >90)
GLUCOSE: 125 mg/dL (ref 70–140)
Potassium: 3.8 mEq/L (ref 3.5–5.1)
Sodium: 141 mEq/L (ref 136–145)
Total Protein: 6.8 g/dL (ref 6.4–8.3)

## 2014-09-19 LAB — UA PROTEIN, DIPSTICK - CHCC: Protein, ur: <30 mg/dL

## 2014-09-19 LAB — CBC WITH DIFFERENTIAL/PLATELET
BASO%: 0.5 % (ref 0.0–2.0)
Basophils Absolute: 0.1 10*3/uL (ref 0.0–0.1)
EOS%: 0.6 % (ref 0.0–7.0)
Eosinophils Absolute: 0.1 10*3/uL (ref 0.0–0.5)
HCT: 36.1 % (ref 34.8–46.6)
HEMOGLOBIN: 11.9 g/dL (ref 11.6–15.9)
LYMPH#: 1.2 10*3/uL (ref 0.9–3.3)
LYMPH%: 8.9 % — ABNORMAL LOW (ref 14.0–49.7)
MCH: 28.7 pg (ref 25.1–34.0)
MCHC: 32.9 g/dL (ref 31.5–36.0)
MCV: 87.3 fL (ref 79.5–101.0)
MONO#: 0.6 10*3/uL (ref 0.1–0.9)
MONO%: 4 % (ref 0.0–14.0)
NEUT#: 12 10*3/uL — ABNORMAL HIGH (ref 1.5–6.5)
NEUT%: 86 % — ABNORMAL HIGH (ref 38.4–76.8)
Platelets: 193 10*3/uL (ref 145–400)
RBC: 4.13 10*6/uL (ref 3.70–5.45)
RDW: 24.1 % — AB (ref 11.2–14.5)
WBC: 13.9 10*3/uL — ABNORMAL HIGH (ref 3.9–10.3)

## 2014-09-19 MED ORDER — ATROPINE SULFATE 1 MG/ML IJ SOLN
INTRAMUSCULAR | Status: AC
  Filled 2014-09-19: qty 1

## 2014-09-19 MED ORDER — OXYCODONE-ACETAMINOPHEN 10-325 MG PO TABS: 1.0000 | ORAL_TABLET | ORAL | Status: DC | PRN

## 2014-09-19 MED ORDER — IRINOTECAN HCL CHEMO INJECTION 100 MG/5ML
184.0000 mg/m2 | Freq: Once | INTRAVENOUS | Status: AC
  Administered 2014-09-19: 300 mg via INTRAVENOUS
  Filled 2014-09-19: qty 15

## 2014-09-19 MED ORDER — ATROPINE SULFATE 1 MG/ML IJ SOLN
0.5000 mg | Freq: Once | INTRAMUSCULAR | Status: AC | PRN
  Administered 2014-09-19: 0.5 mg via INTRAVENOUS

## 2014-09-19 MED ORDER — LEUCOVORIN CALCIUM INJECTION 350 MG
400.0000 mg/m2 | Freq: Once | INTRAVENOUS | Status: AC
  Administered 2014-09-19: 652 mg via INTRAVENOUS
  Filled 2014-09-19: qty 32.6

## 2014-09-19 MED ORDER — SODIUM CHLORIDE 0.9 % IV SOLN
Freq: Once | INTRAVENOUS | Status: AC
  Administered 2014-09-19: 10:00:00 via INTRAVENOUS
  Filled 2014-09-19: qty 8

## 2014-09-19 MED ORDER — FLUOROURACIL CHEMO INJECTION 500 MG/10ML
280.0000 mg/m2 | Freq: Once | INTRAVENOUS | Status: AC
  Administered 2014-09-19: 450 mg via INTRAVENOUS
  Filled 2014-09-19: qty 9

## 2014-09-19 MED ORDER — FLUOROURACIL CHEMO INJECTION 5 GM/100ML
1680.0000 mg/m2 | INTRAVENOUS | Status: DC
  Administered 2014-09-19: 2650 mg via INTRAVENOUS
  Filled 2014-09-19: qty 53

## 2014-09-19 MED ORDER — SODIUM CHLORIDE 0.9 % IV SOLN
Freq: Once | INTRAVENOUS | Status: AC
  Administered 2014-09-19: 10:00:00 via INTRAVENOUS

## 2014-09-19 MED ORDER — SODIUM CHLORIDE 0.9 % IV SOLN
5.0000 mg/kg | Freq: Once | INTRAVENOUS | Status: AC
  Administered 2014-09-19: 300 mg via INTRAVENOUS
  Filled 2014-09-19: qty 12

## 2014-09-19 MED ORDER — SODIUM CHLORIDE 0.9 % IJ SOLN
10.0000 mL | INTRAMUSCULAR | Status: DC | PRN
  Administered 2014-09-19: 10 mL via INTRAVENOUS
  Filled 2014-09-19: qty 10

## 2014-09-19 NOTE — Progress Notes (Signed)
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 She completed another cycle of FOLFIRI/Avastin 09/05/2014. She was seen in an unscheduled visit on 09/14/2014 for evaluation of dyspnea and tachycardia. Chest CT showed no escalation for the symptoms. Lung nodules were essentially unchanged.  She overall is feeling better. She continues to have exertional dyspnea and episodes of tachycardia. Oral intake is better. No nausea or vomiting. No diarrhea. Lip ulcers are healing. She denies any bleeding. No leg swelling or calf pain.  Objective:  Vital signs in last 24 hours:  Blood pressure 167/76, pulse 90, temperature 98.4 F (36.9 C), temperature source Oral, resp. rate 18, height $RemoveBe'5\' 3"'rHlbeQHzr$  (1.6 m), weight 123 lb 8 oz (56.019 kg), SpO2 100 %.    HEENT: No thrush or ulcers. Resp: Breath sounds diminished at the right lower lung field. No respiratory distress. Cardio: Regular rate and rhythm. GI: Abdomen soft and nontender. No hepatomegaly. Vascular: No leg edema. Soft and nontender. Port-A-Cath without erythema.    Lab Results:  Lab Results  Component Value Date   WBC 13.9* 09/19/2014   HGB 11.9 09/19/2014   HCT 36.1 09/19/2014   MCV 87.3 09/19/2014   PLT 193 09/19/2014   NEUTROABS 12.0* 09/19/2014    Imaging:  No results found.  Medications: I have reviewed the patient's current medications.  Assessment/Plan: 1. Metastatic rectal cancer diagnosed July 2014.  Colonoscopy on 10/25/2012 showed a bulky circumferential friable mass lesion in the rectum at approximately 4 cm above the dentate line. The mass was subtotally obstructing and would not permit passage of the colonoscope proximal. Biopsy showed adenocarcinoma.   Staging CT scans 10/25/2012 showed a right lower lobe lung mass measuring 5 x 4.6 cm; 0.8 cm nodule right upper lobe; masslike rectosigmoid wall thickening, surrounding stranding  and adjacent lymphadenopathy.   PET scan 11/02/2012 showed hypermetabolism in the area of apparent circumferential rectal wall thickening; the 5 cm right lower lobe pulmonary mass was hypermetabolic with SUV max equal 8. No other hypermetabolic lung lesions were evident. Small bilateral pulmonary parenchymal nodules were noted.   She completed 5 cycles of FOLFOX chemotherapy 11/22/2012 through 02/13/2013.   Status post right thoracotomy, bilobectomy (right lower and middle lobectomy) with en bloc resection of the diaphragm 02/27/2013. Pathology showed adenocarcinoma consistent with metastatic colorectal adenocarcinoma, 4.3 cm, negative margins, no lymph node involvement. Negative soft tissue biopsy of the diaphragm x2. Positive for K-ras mutation-codon 13   She completed concurrent radiation and Xeloda for treatment of the rectal tumor 03/06/2013 through 04/11/2013.   Status post low anterior resection with a diverting loop ileostomy on 05/31/2013 at [facility] in [place]. Final pathology showed moderately differentiated adenocarcinoma located in the upper rectum; the tumor measured 2.5 x 2.2 cm; there was invasion through the muscularis propria into pericolic adipose tissue; tumor budding was present. Lymphatic status negative; vascular status negative; proximal and distal surgical margins negative; 2 of 17 lymph nodes showed metastatic adenocarcinoma. Mesenteric tumor deposits were present. Perineural invasion present. [facility] confirmed an additional positive lymph node and the tumor was staged as a T4a   CT abdomen/pelvis 05/12/2013. Metastatic nodules in the lung increased. Chronic loculated right base pneumothorax. Diffuse mucosal thickening of the sigmoid portion of the colon.   Restaging CTs 07/25/2013 with enlargement of bilateral lung nodules, no other evidence of metastatic disease   Ileostomy takedown 08/14/2013   Chest CT 10/02/2013 revealed enlargement of  bilateral lung nodules and increased right pleural fluid   Cycle 1 of FOLFIRI/Avastin 10/03/2013   Cycle 6 of FOLFIRI/Avastin 12/12/2013   Restaging CT 12/22/2013 with a decrease in the size of bilateral lung nodules, no evidence of disease progression   Cycle 7 FOLFIRI/Avastin 12/25/2013  Cycle 8 FOLFIRI/Avastin 01/10/2014  Cycle 9 FOLFIRI/Avastin 01/24/2014  Cycle 10 FOLFIRI/Avastin 02/07/2014  Cycle of 11 FOLFIRI/Avastin 02/21/2014  Restaging CT 03/05/2014 with a decrease in the size of 2 dominant right lung nodules, no new nodules, new pleural gas collection at the right apex  Cycle 12 FOLFIRI/Avastin 03/07/2014  Cycle 13 FOLFIRI/Avastin 03/20/2014  Cycle 14 FOLFIRI/Avastin 04/04/2014  Initiation of maintenance Xeloda/Avastin 04/18/2014  CT chest 07/09/2014 revealed slight enlargement of lung nodules  Treatment with single agent Avastin 07/10/2014  Resumption of FOLFIRI/Avastin on a 2 week schedule 07/25/2014  CT angio chest 09/14/2014 with essentially stable multifocal pulmonary metastatic disease. 2. Status post right VATS, drainage of pleural effusion, visceral and parietal pleural decortication 12/21/2012. Pleural fluid culture positive for viridans Streptococcus. Right pleura biopsies negative for malignancy. Findings favored empyema. 3. Diabetes. 4. Frequent bowel movements, diminished pelvic muscle tone-we made a referral to the pelvic physical therapy clinic 5. Pain in the left leg with tenderness at the left calf and left ischium-MRI of the lumbar spine and pelvis on 10/13/2011 with no evidence of metastatic disease. A right iliac bone "lesion" was felt to most likely represent heterogenous marrow. Edema and enhancement was noted at the left sciatic nerve compared to the right side with no mass lesion 6. Hypertension-on HCTZ 7. Left foot fracture 8. BRIP1 mutation 9. Mucositis most likely related to 5-fluorouracil. Dose reduction of 5 fluorouracil beginning  09/19/2014.   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 Plan to continue every 2 week FOLFIRI/Avastin for now. She is considering changing to a 3 week schedule toward the end of July/beginning of August.  She has developed mucositis most likely related to the 5-fluorouracil. The 5-fluorouracil will be dose reduced beginning with today's treatment.  She will return for a follow-up visit and the next cycle of FOLFIRI/Avastin in 2 weeks. She will contact the office in the interim with any problems.  Plan reviewed with Dr. Benay Spice.    Ned Card ANP/GNP-BC   09/19/2014  9:12 AM

## 2014-09-19 NOTE — Patient Instructions (Signed)
[place] Discharge Instructions for Patients Receiving Chemotherapy  Today you received the following chemotherapy agents FIRI/Avastin.  To help prevent nausea and vomiting after your treatment, we encourage you to take your nausea medication as directed.    If you develop nausea and vomiting that is not controlled by your nausea medication, call the clinic.   BELOW ARE SYMPTOMS THAT SHOULD BE REPORTED IMMEDIATELY:  *FEVER GREATER THAN 100.5 F  *CHILLS WITH OR WITHOUT FEVER  NAUSEA AND VOMITING THAT IS NOT CONTROLLED WITH YOUR NAUSEA MEDICATION  *UNUSUAL SHORTNESS OF BREATH  *UNUSUAL BRUISING OR BLEEDING  TENDERNESS IN MOUTH AND THROAT WITH OR WITHOUT PRESENCE OF ULCERS  *URINARY PROBLEMS  *BOWEL PROBLEMS  UNUSUAL RASH Items with * indicate a potential emergency and should be followed up as soon as possible.  Feel free to call the clinic you have any questions or concerns. The clinic phone number is [phone].  Please show the [place] at check-in to the Emergency Department and triage nurse.

## 2014-09-19 NOTE — Patient Instructions (Signed)

## 2014-09-21 ENCOUNTER — Other Ambulatory Visit: Payer: Self-pay | Admitting: Oncology

## 2014-09-21 ENCOUNTER — Ambulatory Visit (HOSPITAL_BASED_OUTPATIENT_CLINIC_OR_DEPARTMENT_OTHER): Payer: 59

## 2014-09-21 DIAGNOSIS — C20 Malignant neoplasm of rectum: Secondary | ICD-10-CM

## 2014-09-21 DIAGNOSIS — C7801 Secondary malignant neoplasm of right lung: Secondary | ICD-10-CM

## 2014-09-21 DIAGNOSIS — C7800 Secondary malignant neoplasm of unspecified lung: Principal | ICD-10-CM

## 2014-09-21 DIAGNOSIS — Z5189 Encounter for other specified aftercare: Secondary | ICD-10-CM | POA: Diagnosis not present

## 2014-09-21 DIAGNOSIS — C7802 Secondary malignant neoplasm of left lung: Secondary | ICD-10-CM | POA: Diagnosis not present

## 2014-09-21 MED ORDER — HEPARIN SOD (PORK) LOCK FLUSH 100 UNIT/ML IV SOLN
500.0000 [IU] | Freq: Once | INTRAVENOUS | Status: AC | PRN
  Administered 2014-09-21: 500 [IU]
  Filled 2014-09-21: qty 5

## 2014-09-21 MED ORDER — SODIUM CHLORIDE 0.9 % IJ SOLN
10.0000 mL | INTRAMUSCULAR | Status: DC | PRN
  Administered 2014-09-21: 10 mL
  Filled 2014-09-21: qty 10

## 2014-09-21 MED ORDER — PEGFILGRASTIM INJECTION 6 MG/0.6ML ~~LOC~~
6.0000 mg | PREFILLED_SYRINGE | Freq: Once | SUBCUTANEOUS | Status: AC
  Administered 2014-09-21: 6 mg via SUBCUTANEOUS
  Filled 2014-09-21: qty 0.6

## 2014-09-24 ENCOUNTER — Other Ambulatory Visit: Payer: Self-pay

## 2014-09-26 ENCOUNTER — Encounter: Payer: Self-pay | Admitting: Nurse Practitioner

## 2014-09-26 NOTE — Progress Notes (Signed)
Briefly visited with Dr. [name] while she was sitting with her sister receiving chemotherapy today.  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  She states that all diarrhea has resolved since using the Imodium.  She is also happy to report that she no longer has any nausea; and is not vomiting.  She states her appetite has improved within the past few days as well; and she is trying to push fluids.  She has gained a little weight back.  She denies any recent fevers or chills.  Advised both patient and her sister to call/return if she develops any worsening symptoms whatsoever.  Both patient and her sister stated understanding of all instructions; and were in agreement with this plan of care.

## 2014-10-03 ENCOUNTER — Other Ambulatory Visit: Payer: Self-pay | Admitting: *Deleted

## 2014-10-03 ENCOUNTER — Ambulatory Visit (HOSPITAL_BASED_OUTPATIENT_CLINIC_OR_DEPARTMENT_OTHER): Payer: 59 | Admitting: Oncology

## 2014-10-03 ENCOUNTER — Ambulatory Visit: Payer: 59

## 2014-10-03 ENCOUNTER — Other Ambulatory Visit (HOSPITAL_BASED_OUTPATIENT_CLINIC_OR_DEPARTMENT_OTHER): Payer: 59

## 2014-10-03 VITALS — BP 154/73 | HR 74 | Temp 97.5°F | Resp 18 | Ht 63.0 in | Wt 122.2 lb

## 2014-10-03 DIAGNOSIS — C20 Malignant neoplasm of rectum: Secondary | ICD-10-CM

## 2014-10-03 DIAGNOSIS — C7801 Secondary malignant neoplasm of right lung: Secondary | ICD-10-CM

## 2014-10-03 DIAGNOSIS — C7800 Secondary malignant neoplasm of unspecified lung: Principal | ICD-10-CM

## 2014-10-03 DIAGNOSIS — I10 Essential (primary) hypertension: Secondary | ICD-10-CM

## 2014-10-03 DIAGNOSIS — C7802 Secondary malignant neoplasm of left lung: Secondary | ICD-10-CM

## 2014-10-03 LAB — CBC WITH DIFFERENTIAL/PLATELET
BASO%: 1.4 % (ref 0.0–2.0)
Basophils Absolute: 0.1 10*3/uL (ref 0.0–0.1)
EOS%: 1 % (ref 0.0–7.0)
Eosinophils Absolute: 0.1 10*3/uL (ref 0.0–0.5)
HEMATOCRIT: 37.5 % (ref 34.8–46.6)
HGB: 12.4 g/dL (ref 11.6–15.9)
LYMPH%: 9.9 % — ABNORMAL LOW (ref 14.0–49.7)
MCH: 29.2 pg (ref 25.1–34.0)
MCHC: 33 g/dL (ref 31.5–36.0)
MCV: 88.4 fL (ref 79.5–101.0)
MONO#: 0.6 10*3/uL (ref 0.1–0.9)
MONO%: 5.6 % (ref 0.0–14.0)
NEUT#: 8.7 10*3/uL — ABNORMAL HIGH (ref 1.5–6.5)
NEUT%: 82.1 % — AB (ref 38.4–76.8)
Platelets: 184 10*3/uL (ref 145–400)
RBC: 4.25 10*6/uL (ref 3.70–5.45)
RDW: 23 % — AB (ref 11.2–14.5)
WBC: 10.6 10*3/uL — ABNORMAL HIGH (ref 3.9–10.3)
lymph#: 1 10*3/uL (ref 0.9–3.3)

## 2014-10-03 LAB — COMPREHENSIVE METABOLIC PANEL (CC13)
ALBUMIN: 4.4 g/dL (ref 3.5–5.0)
ALT: 51 U/L (ref 0–55)
AST: 28 U/L (ref 5–34)
Alkaline Phosphatase: 94 U/L (ref 40–150)
Anion Gap: 10 mEq/L (ref 3–11)
BUN: 17.9 mg/dL (ref 7.0–26.0)
CO2: 27 mEq/L (ref 22–29)
Calcium: 11.2 mg/dL — ABNORMAL HIGH (ref 8.4–10.4)
Chloride: 103 mEq/L (ref 98–109)
Creatinine: 0.8 mg/dL (ref 0.6–1.1)
EGFR: 79 mL/min/{1.73_m2} — ABNORMAL LOW (ref >90)
Glucose: 119 mg/dl (ref 70–140)
Potassium: 4.2 mEq/L (ref 3.5–5.1)
SODIUM: 140 meq/L (ref 136–145)
Total Bilirubin: 0.37 mg/dL (ref 0.20–1.20)
Total Protein: 7.1 g/dL (ref 6.4–8.3)

## 2014-10-03 LAB — UA PROTEIN, DIPSTICK - CHCC

## 2014-10-03 MED ORDER — METHYLPHENIDATE HCL ER (OSM) 27 MG PO TBCR: 18.0000 mg | EXTENDED_RELEASE_TABLET | Freq: Every day | ORAL | Status: DC

## 2014-10-03 NOTE — Progress Notes (Signed)
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 She reports developing severe diarrhea on days 4 and 6 following chemotherapy. There was associated malaise. She has spent half of her time in bed over the past week. She developed mouth sores that have resolved. The diarrhea has resolved and she now has an improved appetite/energy level. No bleeding. No dyspnea. The Concerta has helped her memory. She requests an increased dose.  Objective:  Vital signs in last 24 hours:  Blood pressure 154/73, pulse 74, temperature 97.5 F (36.4 C), temperature source Oral, resp. rate 18, height _0  (1.6 m), weight 122 lb 3.2 oz (55.43 kg), SpO2 100 %.    HEENT: No thrush or ulcers Resp: Decreased breath sounds Route the right chest, no respiratory distress Cardio: Regular rate and rhythm GI: No hepatosplenomegaly, nontender Vascular: No leg edema   Portacath/PICC-without erythema  Lab Results:  Lab Results  Component Value Date   WBC 10.6* 10/03/2014   HGB 12.4 10/03/2014   HCT 37.5 10/03/2014   MCV 88.4 10/03/2014   PLT 184 10/03/2014   NEUTROABS 8.7* 10/03/2014     Medications: I have reviewed the patient's current medications.  Assessment/Plan: 1. Metastatic rectal cancer diagnosed July 2014.  Colonoscopy on 10/25/2012 showed a bulky circumferential friable mass lesion in the rectum at approximately 4 cm above the dentate line. The mass was subtotally obstructing and would not permit passage of the colonoscope proximal. Biopsy showed adenocarcinoma.   Staging CT scans 10/25/2012 showed a right lower lobe lung mass measuring 5 x 4.6 cm; 0.8 cm nodule right upper lobe; masslike rectosigmoid wall thickening, surrounding stranding and adjacent lymphadenopathy.   PET scan 11/02/2012 showed hypermetabolism in the area of apparent circumferential rectal wall thickening;  the 5 cm right lower lobe pulmonary mass was hypermetabolic with SUV max equal 8. No other hypermetabolic lung lesions were evident. Small bilateral pulmonary parenchymal nodules were noted.   She completed 5 cycles of FOLFOX chemotherapy 11/22/2012 through 02/13/2013.   Status post right thoracotomy, bilobectomy (right lower and middle lobectomy) with en bloc resection of the diaphragm 02/27/2013. Pathology showed adenocarcinoma consistent with metastatic colorectal adenocarcinoma, 4.3 cm, negative margins, no lymph node involvement. Negative soft tissue biopsy of the diaphragm x2. Positive for K-ras mutation-codon 13   She completed concurrent radiation and Xeloda for treatment of the rectal tumor 03/06/2013 through 04/11/2013.   Status post low anterior resection with a diverting loop ileostomy on 05/31/2013 at [facility] in [place]. Final pathology showed moderately differentiated adenocarcinoma located in the upper rectum; the tumor measured 2.5 x 2.2 cm; there was invasion through the muscularis propria into pericolic adipose tissue; tumor budding was present. Lymphatic status negative; vascular status negative; proximal and distal surgical margins negative; 2 of 17 lymph nodes showed metastatic adenocarcinoma. Mesenteric tumor deposits were present. Perineural invasion present. [facility] consult at [facility] confirmed an additional positive lymph node and the tumor was staged as a T4a   CT abdomen/pelvis 05/12/2013. Metastatic nodules in the lung increased. Chronic loculated right base pneumothorax. Diffuse mucosal thickening of the sigmoid portion of the colon.   Restaging CTs 07/25/2013 with enlargement of bilateral lung nodules, no other evidence of metastatic disease   Ileostomy takedown 08/14/2013   Chest CT 10/02/2013 revealed enlargement of bilateral lung nodules and increased right pleural fluid   Cycle 1 of FOLFIRI/Avastin 10/03/2013   Cycle 6 of FOLFIRI/Avastin 12/12/2013  Restaging CT 12/22/2013 with a decrease in the size of bilateral lung nodules, no evidence of disease progression   Cycle 7 FOLFIRI/Avastin 12/25/2013  Cycle 8 FOLFIRI/Avastin 01/10/2014  Cycle 9 FOLFIRI/Avastin 01/24/2014  Cycle 10 FOLFIRI/Avastin 02/07/2014  Cycle of 11 FOLFIRI/Avastin 02/21/2014  Restaging CT 03/05/2014 with a decrease in the size of 2 dominant right lung nodules, no new nodules, new pleural gas collection at the right apex  Cycle 12 FOLFIRI/Avastin 03/07/2014  Cycle 13 FOLFIRI/Avastin 03/20/2014  Cycle 14 FOLFIRI/Avastin 04/04/2014  Initiation of maintenance Xeloda/Avastin 04/18/2014  CT chest 07/09/2014 revealed slight enlargement of lung nodules  Treatment with single agent Avastin 07/10/2014  Resumption of FOLFIRI/Avastin on a 2 week schedule 07/25/2014  CT angio chest 09/14/2014 with essentially stable multifocal pulmonary metastatic disease.  5-fluorouracil dose reduced beginning 09/19/2014  FOLFIRI/Avastin switch to a 3 week schedule beginning with treatment 10/10/2014 secondary to malaise, diarrhea, and mucositis, irinotecan dose reduced 2. Status post right VATS, drainage of pleural effusion, visceral and parietal pleural decortication 12/21/2012. Pleural fluid culture positive for viridans Streptococcus. Right pleura biopsies negative for malignancy. Findings favored empyema. 3. Diabetes. 4. Frequent bowel movements, diminished pelvic muscle tone-we made a referral to the pelvic physical therapy clinic 5. Pain in the left leg with tenderness at the left calf and left ischium-MRI of the lumbar spine and pelvis on 10/13/2011 with no evidence of metastatic disease. A right iliac bone "lesion" was felt to most likely represent heterogenous marrow. Edema and enhancement was noted at the left sciatic nerve compared to the right side with no mass lesion 6. Hypertension-on HCTZ 7. Left foot fracture 8. BRIP1 mutation 9. Mucositis most likely related  to 5-fluorouracil. Dose reduction of 5 fluorouracil beginning 09/19/2014.   Disposition:  She developed increased diarrhea and malaise following the most recent cycle of chemotherapy. I discussed treatment options with Dr. [name]. We decided to change to an every 3 week chemotherapy schedule and the irinotecan will be dose reduced. She will return for FOLFIRI/Avastin 10/10/2014. She will be scheduled for an office visit and chemotherapy on 10/31/2014.  Betsy Coder, MD  10/03/2014  11:35 AM

## 2014-10-05 ENCOUNTER — Ambulatory Visit: Payer: Medicare Other

## 2014-10-05 ENCOUNTER — Telehealth: Payer: Self-pay | Admitting: *Deleted

## 2014-10-05 NOTE — Telephone Encounter (Signed)
Per staff message and POF I have scheduled appts. Advised scheduler of appts. [name]  

## 2014-10-10 ENCOUNTER — Other Ambulatory Visit (HOSPITAL_BASED_OUTPATIENT_CLINIC_OR_DEPARTMENT_OTHER): Payer: 59

## 2014-10-10 ENCOUNTER — Ambulatory Visit (HOSPITAL_BASED_OUTPATIENT_CLINIC_OR_DEPARTMENT_OTHER): Payer: 59

## 2014-10-10 VITALS — BP 149/78 | HR 83 | Temp 98.4°F | Resp 18

## 2014-10-10 DIAGNOSIS — C20 Malignant neoplasm of rectum: Secondary | ICD-10-CM

## 2014-10-10 DIAGNOSIS — Z5112 Encounter for antineoplastic immunotherapy: Secondary | ICD-10-CM

## 2014-10-10 DIAGNOSIS — C7801 Secondary malignant neoplasm of right lung: Secondary | ICD-10-CM

## 2014-10-10 DIAGNOSIS — C7800 Secondary malignant neoplasm of unspecified lung: Principal | ICD-10-CM

## 2014-10-10 DIAGNOSIS — Z5111 Encounter for antineoplastic chemotherapy: Secondary | ICD-10-CM | POA: Diagnosis not present

## 2014-10-10 DIAGNOSIS — C7802 Secondary malignant neoplasm of left lung: Secondary | ICD-10-CM | POA: Diagnosis not present

## 2014-10-10 LAB — COMPREHENSIVE METABOLIC PANEL (CC13)
ALT: 32 U/L (ref 0–55)
AST: 21 U/L (ref 5–34)
Albumin: 4.4 g/dL (ref 3.5–5.0)
Alkaline Phosphatase: 76 U/L (ref 40–150)
Anion Gap: 9 mEq/L (ref 3–11)
BILIRUBIN TOTAL: 0.41 mg/dL (ref 0.20–1.20)
BUN: 16.6 mg/dL (ref 7.0–26.0)
CO2: 28 meq/L (ref 22–29)
CREATININE: 0.7 mg/dL (ref 0.6–1.1)
Calcium: 10.6 mg/dL — ABNORMAL HIGH (ref 8.4–10.4)
Chloride: 103 mEq/L (ref 98–109)
EGFR: 87 mL/min/{1.73_m2} — ABNORMAL LOW (ref >90)
GLUCOSE: 167 mg/dL — AB (ref 70–140)
POTASSIUM: 3.8 meq/L (ref 3.5–5.1)
SODIUM: 140 meq/L (ref 136–145)
Total Protein: 7.4 g/dL (ref 6.4–8.3)

## 2014-10-10 LAB — CBC WITH DIFFERENTIAL/PLATELET
BASO%: 0.7 % (ref 0.0–2.0)
BASOS ABS: 0.1 10*3/uL (ref 0.0–0.1)
EOS%: 1.2 % (ref 0.0–7.0)
Eosinophils Absolute: 0.1 10*3/uL (ref 0.0–0.5)
HCT: 39.6 % (ref 34.8–46.6)
HEMOGLOBIN: 13 g/dL (ref 11.6–15.9)
LYMPH%: 10.2 % — ABNORMAL LOW (ref 14.0–49.7)
MCH: 29.5 pg (ref 25.1–34.0)
MCHC: 32.9 g/dL (ref 31.5–36.0)
MCV: 89.6 fL (ref 79.5–101.0)
MONO#: 0.5 10*3/uL (ref 0.1–0.9)
MONO%: 5.1 % (ref 0.0–14.0)
NEUT%: 82.8 % — AB (ref 38.4–76.8)
NEUTROS ABS: 8.9 10*3/uL — AB (ref 1.5–6.5)
Platelets: 241 10*3/uL (ref 145–400)
RBC: 4.42 10*6/uL (ref 3.70–5.45)
RDW: 22.9 % — AB (ref 11.2–14.5)
WBC: 10.8 10*3/uL — AB (ref 3.9–10.3)
lymph#: 1.1 10*3/uL (ref 0.9–3.3)

## 2014-10-10 MED ORDER — ATROPINE SULFATE 1 MG/ML IJ SOLN
INTRAMUSCULAR | Status: AC
  Filled 2014-10-10: qty 1

## 2014-10-10 MED ORDER — SODIUM CHLORIDE 0.9 % IV SOLN
7.2000 mg/kg | Freq: Once | INTRAVENOUS | Status: AC
  Administered 2014-10-10: 400 mg via INTRAVENOUS
  Filled 2014-10-10: qty 16

## 2014-10-10 MED ORDER — SODIUM CHLORIDE 0.9 % IV SOLN
1680.0000 mg/m2 | INTRAVENOUS | Status: DC
  Administered 2014-10-10: 2750 mg via INTRAVENOUS
  Filled 2014-10-10: qty 55

## 2014-10-10 MED ORDER — IRINOTECAN HCL CHEMO INJECTION 100 MG/5ML
135.0000 mg/m2 | Freq: Once | INTRAVENOUS | Status: AC
  Administered 2014-10-10: 220 mg via INTRAVENOUS
  Filled 2014-10-10: qty 11

## 2014-10-10 MED ORDER — LEUCOVORIN CALCIUM INJECTION 350 MG
175.0000 mg/m2 | Freq: Once | INTRAVENOUS | Status: AC
  Administered 2014-10-10: 286 mg via INTRAVENOUS
  Filled 2014-10-10: qty 14.3

## 2014-10-10 MED ORDER — SODIUM CHLORIDE 0.9 % IV SOLN
Freq: Once | INTRAVENOUS | Status: AC
  Administered 2014-10-10: 13:00:00 via INTRAVENOUS
  Filled 2014-10-10: qty 8

## 2014-10-10 MED ORDER — FLUOROURACIL CHEMO INJECTION 500 MG/10ML
175.0000 mg/m2 | Freq: Once | INTRAVENOUS | Status: AC
  Administered 2014-10-10: 300 mg via INTRAVENOUS
  Filled 2014-10-10: qty 6

## 2014-10-10 MED ORDER — SODIUM CHLORIDE 0.9 % IV SOLN: Freq: Once | INTRAVENOUS | Status: DC

## 2014-10-10 MED ORDER — ATROPINE SULFATE 1 MG/ML IJ SOLN
0.5000 mg | Freq: Once | INTRAMUSCULAR | Status: AC | PRN
  Administered 2014-10-10: 0.5 mg via INTRAVENOUS

## 2014-10-10 NOTE — Patient Instructions (Signed)
[facility] Discharge Instructions for Patients Receiving Chemotherapy  Today you received the following chemotherapy agents irinotecan/leucovorin/avastin/5FU  To help prevent nausea and vomiting after your treatment, we encourage you to take your nausea medication as directed   If you develop nausea and vomiting that is not controlled by your nausea medication, call the clinic.   BELOW ARE SYMPTOMS THAT SHOULD BE REPORTED IMMEDIATELY:  *FEVER GREATER THAN 100.5 F  *CHILLS WITH OR WITHOUT FEVER  NAUSEA AND VOMITING THAT IS NOT CONTROLLED WITH YOUR NAUSEA MEDICATION  *UNUSUAL SHORTNESS OF BREATH  *UNUSUAL BRUISING OR BLEEDING  TENDERNESS IN MOUTH AND THROAT WITH OR WITHOUT PRESENCE OF ULCERS  *URINARY PROBLEMS  *BOWEL PROBLEMS  UNUSUAL RASH Items with * indicate a potential emergency and should be followed up as soon as possible.  Feel free to call the clinic you have any questions or concerns. The clinic phone number is [phone].

## 2014-10-12 ENCOUNTER — Ambulatory Visit: Payer: 59

## 2014-10-12 ENCOUNTER — Ambulatory Visit (HOSPITAL_BASED_OUTPATIENT_CLINIC_OR_DEPARTMENT_OTHER): Payer: 59

## 2014-10-12 VITALS — BP 147/71 | HR 80 | Temp 98.7°F | Resp 18

## 2014-10-12 DIAGNOSIS — C20 Malignant neoplasm of rectum: Secondary | ICD-10-CM

## 2014-10-12 DIAGNOSIS — C7802 Secondary malignant neoplasm of left lung: Secondary | ICD-10-CM

## 2014-10-12 DIAGNOSIS — C7801 Secondary malignant neoplasm of right lung: Secondary | ICD-10-CM

## 2014-10-12 DIAGNOSIS — Z5189 Encounter for other specified aftercare: Secondary | ICD-10-CM | POA: Diagnosis not present

## 2014-10-12 DIAGNOSIS — C7800 Secondary malignant neoplasm of unspecified lung: Principal | ICD-10-CM

## 2014-10-12 MED ORDER — HEPARIN SOD (PORK) LOCK FLUSH 100 UNIT/ML IV SOLN
500.0000 [IU] | Freq: Once | INTRAVENOUS | Status: AC | PRN
  Administered 2014-10-12: 500 [IU]
  Filled 2014-10-12: qty 5

## 2014-10-12 MED ORDER — SODIUM CHLORIDE 0.9 % IJ SOLN
10.0000 mL | INTRAMUSCULAR | Status: DC | PRN
Start: 2014-10-12 — End: 2014-10-12
  Administered 2014-10-12: 10 mL
  Filled 2014-10-12: qty 10

## 2014-10-12 MED ORDER — PEGFILGRASTIM INJECTION 6 MG/0.6ML ~~LOC~~
6.0000 mg | PREFILLED_SYRINGE | Freq: Once | SUBCUTANEOUS | Status: AC
  Administered 2014-10-12: 6 mg via SUBCUTANEOUS
  Filled 2014-10-12: qty 0.6

## 2014-10-12 NOTE — Patient Instructions (Signed)
Pegfilgrastim injection What is this medicine? PEGFILGRASTIM (peg fil GRA stim) is a long-acting granulocyte colony-stimulating factor that stimulates the growth of neutrophils, a type of white blood cell important in the body's fight against infection. It is used to reduce the incidence of fever and infection in patients with certain types of cancer who are receiving chemotherapy that affects the bone marrow. This medicine may be used for other purposes; ask your health care provider or pharmacist if you have questions. COMMON BRAND NAME(S): Neulasta What should I tell my health care provider before I take this medicine? They need to know if you have any of these conditions: -latex allergy -ongoing radiation therapy -sickle cell disease -skin reactions to acrylic adhesives (On-Body Injector only) -an unusual or allergic reaction to pegfilgrastim, filgrastim, other medicines, foods, dyes, or preservatives -pregnant or trying to get pregnant -breast-feeding How should I use this medicine? This medicine is for injection under the skin. If you get this medicine at home, you will be taught how to prepare and give the pre-filled syringe or how to use the On-body Injector. Refer to the patient Instructions for Use for detailed instructions. Use exactly as directed. Take your medicine at regular intervals. Do not take your medicine more often than directed. It is important that you put your used needles and syringes in a special sharps container. Do not put them in a trash can. If you do not have a sharps container, call your pharmacist or healthcare provider to get one. Talk to your pediatrician regarding the use of this medicine in children. Special care may be needed. Overdosage: If you think you have taken too much of this medicine contact a poison control center or emergency room at once. NOTE: This medicine is only for you. Do not share this medicine with others. What if I miss a dose? It is  important not to miss your dose. Call your doctor or health care professional if you miss your dose. If you miss a dose due to an On-body Injector failure or leakage, a new dose should be administered as soon as possible using a single prefilled syringe for manual use. What may interact with this medicine? Interactions have not been studied. Give your health care provider a list of all the medicines, herbs, non-prescription drugs, or dietary supplements you use. Also tell them if you smoke, drink alcohol, or use illegal drugs. Some items may interact with your medicine. This list may not describe all possible interactions. Give your health care provider a list of all the medicines, herbs, non-prescription drugs, or dietary supplements you use. Also tell them if you smoke, drink alcohol, or use illegal drugs. Some items may interact with your medicine. What should I watch for while using this medicine? You may need blood work done while you are taking this medicine. If you are going to need a MRI, CT scan, or other procedure, tell your doctor that you are using this medicine (On-Body Injector only). What side effects may I notice from receiving this medicine? Side effects that you should report to your doctor or health care professional as soon as possible: -allergic reactions like skin rash, itching or hives, swelling of the face, lips, or tongue -dizziness -fever -pain, redness, or irritation at site where injected -pinpoint red spots on the skin -shortness of breath or breathing problems -stomach or side pain, or pain at the shoulder -swelling -tiredness -trouble passing urine Side effects that usually do not require medical attention (report to your doctor   or health care professional if they continue or are bothersome): -bone pain -muscle pain This list may not describe all possible side effects. Call your doctor for medical advice about side effects. You may report side effects to FDA at  1-800-FDA-1088. Where should I keep my medicine? Keep out of the reach of children. Store pre-filled syringes in a refrigerator between 2 and 8 degrees C (36 and 46 degrees F). Do not freeze. Keep in carton to protect from light. Throw away this medicine if it is left out of the refrigerator for more than 48 hours. Throw away any unused medicine after the expiration date. NOTE: This sheet is a summary. It may not cover all possible information. If you have questions about this medicine, talk to your doctor, pharmacist, or health care provider.  2015, Elsevier/Gold Standard. (2013-06-15 16:14:05)  

## 2014-10-12 NOTE — Progress Notes (Signed)
Neulasta injection given by flush nurse 

## 2014-10-17 ENCOUNTER — Ambulatory Visit: Payer: Medicare Other | Admitting: Oncology

## 2014-10-17 ENCOUNTER — Other Ambulatory Visit: Payer: Medicare Other

## 2014-10-17 ENCOUNTER — Other Ambulatory Visit: Payer: Self-pay | Admitting: Thoracic Surgery (Cardiothoracic Vascular Surgery)

## 2014-10-17 DIAGNOSIS — C7800 Secondary malignant neoplasm of unspecified lung: Principal | ICD-10-CM

## 2014-10-17 DIAGNOSIS — C20 Malignant neoplasm of rectum: Secondary | ICD-10-CM

## 2014-10-19 ENCOUNTER — Ambulatory Visit: Payer: Medicare Other

## 2014-10-19 ENCOUNTER — Other Ambulatory Visit: Payer: Self-pay | Admitting: *Deleted

## 2014-10-19 NOTE — Patient Outreach (Signed)
Second high cost referral telephone outreach unsuccessful.  Barrington Ellison RN,CCM,CDE [place] Management Coordinator [facility] Office Phone [phone] Office Fax [phone]

## 2014-10-22 ENCOUNTER — Other Ambulatory Visit: Payer: Self-pay | Admitting: *Deleted

## 2014-10-22 NOTE — Patient Outreach (Signed)
3rd unsuccessful high cost referral outreach phone call. Will mail letter to patient with information about [facility] CM services and 24 hour Nurse Call Line. Barrington Ellison RN,CCM,CDE [place] Management Coordinator [facility] Office Phone [phone] Office Fax [phone]

## 2014-10-23 ENCOUNTER — Encounter: Payer: Self-pay | Admitting: Thoracic Surgery (Cardiothoracic Vascular Surgery)

## 2014-10-23 ENCOUNTER — Ambulatory Visit
Admission: RE | Admit: 2014-10-23 | Discharge: 2014-10-23 | Disposition: A | Discharge status: Home / Self Care | Payer: 59 | Source: Ambulatory Visit | Attending: Thoracic Surgery (Cardiothoracic Vascular Surgery) | Admitting: Thoracic Surgery (Cardiothoracic Vascular Surgery)

## 2014-10-23 ENCOUNTER — Ambulatory Visit (INDEPENDENT_AMBULATORY_CARE_PROVIDER_SITE_OTHER): Payer: 59 | Admitting: Thoracic Surgery (Cardiothoracic Vascular Surgery)

## 2014-10-23 VITALS — BP 168/88 | HR 88 | Resp 20 | Ht 63.0 in | Wt 122.0 lb

## 2014-10-23 DIAGNOSIS — Z9889 Other specified postprocedural states: Secondary | ICD-10-CM

## 2014-10-23 DIAGNOSIS — Z902 Acquired absence of lung [part of]: Secondary | ICD-10-CM

## 2014-10-23 DIAGNOSIS — C20 Malignant neoplasm of rectum: Secondary | ICD-10-CM | POA: Diagnosis not present

## 2014-10-23 DIAGNOSIS — C7800 Secondary malignant neoplasm of unspecified lung: Secondary | ICD-10-CM

## 2014-10-23 NOTE — Progress Notes (Signed)
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  She is a 69 year old woman who had metastatic colon cancer. She had a large lung metastasis. She developed an empyema which required a decortication. She subsequently required a bilobectomy in December 2014. She is currently receiving chemotherapy. She was having a lot of difficulty with that with general malaise and nausea. She was on a 2 week cycle and would just barely recover before her next dose. She has now moved to a 3 week cycle and is doing a lot better with that.  She's not having any chest pain or shortness of breath.  Past Medical History  Diagnosis Date  . Postmenopausal HRT (hormone replacement therapy) 07/01/2011    surgical menopause  . Hx of hyperglycemia 07/01/2011  . Hx of compression fracture of spine 1985    t12 after a  20 foot fall  . Hx of fracture of wrist     after fall   . 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 Hx of colonic polyps   . Early cataract     stoneburner   . High frequency hearing loss     [facility] followed   . Complication of anesthesia     pt hadpanic attacks with versed, no versed  . Status post chemotherapy     completed a total of three cycles of Folfox   . Colon cancer     rectal cancer with lung metastasis  . Skin cancer     multiple  . Heart murmur, systolic 07/01/2011    innocent per Dr. Elijah Birk   . Asthma     as a child none since 39 years old  . Headache(784.0)   . Family history of malignant neoplasm of breast   . Family history of malignant neoplasm of ovary   . 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 Fracture of left foot     2015 trip  . Diabetes mellitus without complication     Past Surgical History  Procedure Laterality  Date  . Tonsillectomy  1952  . Adenoidectomy  1952  . Wrist surgery  1957    left reduction  . Compression fraction  1985    T12  . Anterior cruciate ligament repair  1993    right [place]  . Cosmetic surgery  1999-2007    holderness blepharoplasty facial rhytidectomy mastopexy abdominoplasty   . Dental implant    . Dilation and curettage of uterus  1977  . Total abdominal hysterectomy  2005    with cystocele repair   . Colonoscopy N/A 10/24/2012    Procedure: COLONOSCOPY;  Surgeon: Hilarie Fredrickson, MD;  Location: [facility] ENDOSCOPY;  Service: Endoscopy;  Laterality: N/A;  . Colonoscopy N/A 10/25/2012    Procedure: COLONOSCOPY;  Surgeon: Hilarie Fredrickson, MD;  Location: [facility] ENDOSCOPY;  Service: Endoscopy;  Laterality: N/A;  . Video bronchoscopy Bilateral 10/27/2012    Procedure: VIDEO BRONCHOSCOPY WITH FLUORO;  Surgeon: Coralyn Helling, MD;  Location: [facility] ENDOSCOPY;  Service: Endoscopy;  Laterality: Bilateral;  . Portacath placement Left 11/21/2012    Procedure: INSERTION  PORT-A-CATH;  Surgeon: Melrose Nakayama, MD;  Location: [facility];  Service: Thoracic;  Laterality: Left;  . Video assisted thoracoscopy Right 12/21/2012    Procedure: VIDEO ASSISTED THORACOSCOPY;  Surgeon: Melrose Nakayama, MD;  Location: [facility];  Service: Thoracic;  Laterality: Right;  . Pleural effusion drainage Right 12/21/2012    Procedure: DRAINAGE OF PLEURAL EFFUSION;  Surgeon: Melrose Nakayama, MD;  Location: [place] [facility];  Service: Thoracic;  Laterality: Right;  . Decortication Right 12/21/2012    Procedure: DECORTICATION;  Surgeon: Melrose Nakayama, MD;  Location: [facility];  Service: Thoracic;  Laterality: Right;  . Thorocotomy with lobectomy Right 02/27/2013    Procedure: THOROCOTOMY WITH BI- LOBECTOMY;  Surgeon: Melrose Nakayama, MD;  Location: [facility];  Service: Thoracic;  Laterality: Right;      Current Outpatient Prescriptions  Medication Sig Dispense Refill  . ACCU-CHEK AVIVA PLUS test strip CHECK BLOOD GLUCOSE (SUGAR)  TWICE DAILY 100 each 6  . Alum & Mag Hydroxide-Simeth (MAGIC MOUTHWASH) SOLN Take 5-10 mLs by mouth every 4 (four) hours as needed for mouth pain. 240 mL 0  . Blood Glucose Monitoring Suppl (ACCU-CHEK AVIVA PLUS) W/DEVICE KIT AS DIRECTED 1 kit 0  . Calcium Carbonate (CALCIUM 600 PO) Take 1 tablet by mouth daily.     Marland Kitchen estradiol (MINIVELLE) 0.0375 MG/24HR Place 1 patch onto the skin 2 (two) times a week. Sundays and Thursdays    . hydrochlorothiazide (MICROZIDE) 12.5 MG capsule TAKE 1 CAPSULE BY MOUTH ONCE DAILY 30 capsule 1  . JANUVIA 100 MG tablet TAKE 1 TABLET BY MOUTH ONCE DAILY 30 tablet 5  . metFORMIN (GLUCOPHAGE) 1000 MG tablet TAKE ONE TABLET TWICE DAILY WITH A MEAL 180 tablet 1  . methylphenidate 27 MG PO CR tablet Take 1 tablet (27 mg total) by mouth daily. 30 tablet 0  . methylTESTOSTERone (ANDROID) 10 MG capsule Take 10 mg by mouth.    . Multiple Vitamin (MULTIVITAMIN) tablet Take 1 tablet by mouth daily. Senior    . OVER THE COUNTER MEDICATION Place 1 drop into both ears 2 (two) times a week. Ear drops to decrease wax build up    . oxyCODONE-acetaminophen (PERCOCET) 10-325 MG per tablet Take 1 tablet by mouth every 4 (four) hours as needed for pain. 60 tablet 0  . psyllium (METAMUCIL) 58.6 % powder Take 1 packet by mouth daily.     No current facility-administered medications for this visit.   Facility-Administered Medications Ordered in Other Visits  Medication Dose Route Frequency Provider Last Rate Last Dose  . sodium chloride 0.9 % injection 10 mL  10 mL Intracatheter PRN Ladell Pier, MD   10 mL at 08/24/14 1637    Physical Exam BP 168/88 mmHg  Pulse 88  Resp 20  Ht $R'5\' 3"'Hv$  (1.6 m)  Wt 122 lb (55.339 kg)  BMI 21.62 kg/m2  SpO44 57% 69 year old woman in no acute distress Well-developed and well-nourished Alert and Oriented 3 with no neurologic deficits No cervical or subclavicular adenopathy Absent breath sounds right lower chest Incision well-healed  Diagnostic  Tests:  I personally reviewed her chest x-ray as well as her CT scan from June. She had stable disease on her CT scan. Her chest x-ray shows a right pleural effusion, there is no evidence of recurrent disease.  Impression: 69 year old woman with stage IV colon cancer. She has stable disease and is currently undergoing chemotherapy. She says that she is hoping that there will be an immunotherapy option available to  her soon.  Her chest x-ray shows a right pleural effusion occupying the space formally occupied by the lower and middle lobes. This is to be expected.  Dr. Benay Spice is managing her chemotherapy and doing CT scans as indicated.  Plan: I will see her back in 3 months with a repeat chest x-ray  I spent 10 minutes with Dr. [name] during this visit, greater than 50% was counseling.  Melrose Nakayama, MD [facility] [phone]

## 2014-10-24 ENCOUNTER — Other Ambulatory Visit: Payer: Self-pay | Admitting: Nurse Practitioner

## 2014-10-24 DIAGNOSIS — C20 Malignant neoplasm of rectum: Secondary | ICD-10-CM

## 2014-10-24 MED ORDER — OXYCODONE-ACETAMINOPHEN 10-325 MG PO TABS: 1.0000 | ORAL_TABLET | ORAL | Status: DC | PRN

## 2014-10-28 ENCOUNTER — Other Ambulatory Visit: Payer: Self-pay | Admitting: Oncology

## 2014-10-31 ENCOUNTER — Other Ambulatory Visit: Payer: Medicare Other

## 2014-10-31 ENCOUNTER — Ambulatory Visit (HOSPITAL_BASED_OUTPATIENT_CLINIC_OR_DEPARTMENT_OTHER): Payer: 59

## 2014-10-31 ENCOUNTER — Ambulatory Visit (HOSPITAL_BASED_OUTPATIENT_CLINIC_OR_DEPARTMENT_OTHER): Payer: 59 | Admitting: Oncology

## 2014-10-31 ENCOUNTER — Other Ambulatory Visit (HOSPITAL_BASED_OUTPATIENT_CLINIC_OR_DEPARTMENT_OTHER): Payer: 59

## 2014-10-31 ENCOUNTER — Ambulatory Visit: Payer: Medicare Other | Admitting: Oncology

## 2014-10-31 ENCOUNTER — Telehealth: Payer: Self-pay | Admitting: *Deleted

## 2014-10-31 VITALS — BP 154/75 | HR 76 | Temp 98.5°F | Resp 18 | Ht 63.0 in | Wt 125.8 lb

## 2014-10-31 VITALS — BP 136/55

## 2014-10-31 DIAGNOSIS — C7801 Secondary malignant neoplasm of right lung: Secondary | ICD-10-CM | POA: Diagnosis not present

## 2014-10-31 DIAGNOSIS — C7802 Secondary malignant neoplasm of left lung: Secondary | ICD-10-CM

## 2014-10-31 DIAGNOSIS — C20 Malignant neoplasm of rectum: Secondary | ICD-10-CM | POA: Diagnosis not present

## 2014-10-31 DIAGNOSIS — C7800 Secondary malignant neoplasm of unspecified lung: Principal | ICD-10-CM

## 2014-10-31 DIAGNOSIS — I10 Essential (primary) hypertension: Secondary | ICD-10-CM | POA: Diagnosis not present

## 2014-10-31 DIAGNOSIS — Z5112 Encounter for antineoplastic immunotherapy: Secondary | ICD-10-CM | POA: Diagnosis not present

## 2014-10-31 DIAGNOSIS — E119 Type 2 diabetes mellitus without complications: Secondary | ICD-10-CM

## 2014-10-31 DIAGNOSIS — Z5111 Encounter for antineoplastic chemotherapy: Secondary | ICD-10-CM

## 2014-10-31 DIAGNOSIS — K1230 Oral mucositis (ulcerative), unspecified: Secondary | ICD-10-CM

## 2014-10-31 LAB — CBC WITH DIFFERENTIAL/PLATELET
BASO%: 0.8 % (ref 0.0–2.0)
Basophils Absolute: 0.1 10*3/uL (ref 0.0–0.1)
EOS%: 2.7 % (ref 0.0–7.0)
Eosinophils Absolute: 0.3 10*3/uL (ref 0.0–0.5)
HEMATOCRIT: 36.8 % (ref 34.8–46.6)
HEMOGLOBIN: 12 g/dL (ref 11.6–15.9)
LYMPH%: 10.1 % — AB (ref 14.0–49.7)
MCH: 29.7 pg (ref 25.1–34.0)
MCHC: 32.6 g/dL (ref 31.5–36.0)
MCV: 91.1 fL (ref 79.5–101.0)
MONO#: 0.5 10*3/uL (ref 0.1–0.9)
MONO%: 5.7 % (ref 0.0–14.0)
NEUT#: 7.4 10*3/uL — ABNORMAL HIGH (ref 1.5–6.5)
NEUT%: 80.7 % — AB (ref 38.4–76.8)
Platelets: 180 10*3/uL (ref 145–400)
RBC: 4.04 10*6/uL (ref 3.70–5.45)
RDW: 18.7 % — AB (ref 11.2–14.5)
WBC: 9.2 10*3/uL (ref 3.9–10.3)
lymph#: 0.9 10*3/uL (ref 0.9–3.3)

## 2014-10-31 LAB — COMPREHENSIVE METABOLIC PANEL (CC13)
ALBUMIN: 4 g/dL (ref 3.5–5.0)
ALK PHOS: 74 U/L (ref 40–150)
ALT: 25 U/L (ref 0–55)
ANION GAP: 7 meq/L (ref 3–11)
AST: 16 U/L (ref 5–34)
BUN: 14.8 mg/dL (ref 7.0–26.0)
CALCIUM: 10.4 mg/dL (ref 8.4–10.4)
CO2: 32 mEq/L — ABNORMAL HIGH (ref 22–29)
Chloride: 101 mEq/L (ref 98–109)
Creatinine: 0.7 mg/dL (ref 0.6–1.1)
EGFR: 90 mL/min/{1.73_m2} — ABNORMAL LOW (ref >90)
Glucose: 140 mg/dl (ref 70–140)
Potassium: 4.1 mEq/L (ref 3.5–5.1)
Sodium: 140 mEq/L (ref 136–145)
TOTAL PROTEIN: 6.7 g/dL (ref 6.4–8.3)
Total Bilirubin: 0.38 mg/dL (ref 0.20–1.20)

## 2014-10-31 LAB — UA PROTEIN, DIPSTICK - CHCC: Protein, ur: NEGATIVE mg/dL

## 2014-10-31 MED ORDER — LEUCOVORIN CALCIUM INJECTION 350 MG
175.0000 mg/m2 | Freq: Once | INTRAVENOUS | Status: AC
  Administered 2014-10-31: 286 mg via INTRAVENOUS
  Filled 2014-10-31: qty 14.3

## 2014-10-31 MED ORDER — IRINOTECAN HCL CHEMO INJECTION 100 MG/5ML
135.0000 mg/m2 | Freq: Once | INTRAVENOUS | Status: AC
  Administered 2014-10-31: 220 mg via INTRAVENOUS
  Filled 2014-10-31: qty 11

## 2014-10-31 MED ORDER — SODIUM CHLORIDE 0.9 % IV SOLN
1680.0000 mg/m2 | INTRAVENOUS | Status: DC
  Administered 2014-10-31: 2750 mg via INTRAVENOUS
  Filled 2014-10-31: qty 55

## 2014-10-31 MED ORDER — ATROPINE SULFATE 1 MG/ML IJ SOLN
INTRAMUSCULAR | Status: AC
  Filled 2014-10-31: qty 1

## 2014-10-31 MED ORDER — SODIUM CHLORIDE 0.9 % IV SOLN
Freq: Once | INTRAVENOUS | Status: AC
  Administered 2014-10-31: 09:00:00 via INTRAVENOUS
  Filled 2014-10-31: qty 8

## 2014-10-31 MED ORDER — FLUOROURACIL CHEMO INJECTION 500 MG/10ML
175.0000 mg/m2 | Freq: Once | INTRAVENOUS | Status: AC
  Administered 2014-10-31: 300 mg via INTRAVENOUS
  Filled 2014-10-31: qty 6

## 2014-10-31 MED ORDER — HEPARIN SOD (PORK) LOCK FLUSH 100 UNIT/ML IV SOLN
500.0000 [IU] | Freq: Once | INTRAVENOUS | Status: DC | PRN
  Filled 2014-10-31: qty 5

## 2014-10-31 MED ORDER — ATROPINE SULFATE 1 MG/ML IJ SOLN
0.5000 mg | Freq: Once | INTRAMUSCULAR | Status: AC | PRN
  Administered 2014-10-31: 0.5 mg via INTRAVENOUS

## 2014-10-31 MED ORDER — SODIUM CHLORIDE 0.9 % IV SOLN
400.0000 mg | Freq: Once | INTRAVENOUS | Status: AC
  Administered 2014-10-31: 400 mg via INTRAVENOUS
  Filled 2014-10-31: qty 16

## 2014-10-31 MED ORDER — SODIUM CHLORIDE 0.9 % IV SOLN
Freq: Once | INTRAVENOUS | Status: AC
  Administered 2014-10-31: 09:00:00 via INTRAVENOUS

## 2014-10-31 MED ORDER — SODIUM CHLORIDE 0.9 % IJ SOLN
10.0000 mL | INTRAMUSCULAR | Status: DC | PRN
  Filled 2014-10-31: qty 10

## 2014-10-31 NOTE — Patient Instructions (Signed)
[facility] Discharge Instructions for Patients Receiving Chemotherapy  Today you received the following chemotherapy agents Avastin/Irinotecan/Leucovorin/Fluorouracil.  To help prevent nausea and vomiting after your treatment, we encourage you to take your nausea medication as directed.   If you develop nausea and vomiting that is not controlled by your nausea medication, call the clinic.   BELOW ARE SYMPTOMS THAT SHOULD BE REPORTED IMMEDIATELY:  *FEVER GREATER THAN 100.5 F  *CHILLS WITH OR WITHOUT FEVER  NAUSEA AND VOMITING THAT IS NOT CONTROLLED WITH YOUR NAUSEA MEDICATION  *UNUSUAL SHORTNESS OF BREATH  *UNUSUAL BRUISING OR BLEEDING  TENDERNESS IN MOUTH AND THROAT WITH OR WITHOUT PRESENCE OF ULCERS  *URINARY PROBLEMS  *BOWEL PROBLEMS  UNUSUAL RASH Items with * indicate a potential emergency and should be followed up as soon as possible.  Feel free to call the clinic you have any questions or concerns. The clinic phone number is [phone].  Please show the [place] at check-in to the Emergency Department and triage nurse.

## 2014-10-31 NOTE — Telephone Encounter (Signed)
Per staff message and POF I have scheduled appts. Advised scheduler of appts. [name]  

## 2014-10-31 NOTE — Progress Notes (Signed)
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 She reports an improved energy level with the 3 week interval for chemotherapy. She completed another cycle of FOLFIRI/Avastin on 10/10/2014. No new complaint.  Objective:  Vital signs in last 24 hours:  Blood pressure 154/75, pulse 76, temperature 98.5 F (36.9 C), temperature source Oral, resp. rate 18, height _0  (1.6 m), weight 125 lb 12.8 oz (57.063 kg), SpO2 100 %.    HEENT: No thrush or ulcers Resp: Decreased breath sounds at the right lower chest Cardio: Regular rate and rhythm GI: No hepatomegaly, nontender, no mass Vascular: No leg edema  Skin: Palms without erythema   Portacath/PICC-without erythema  Lab Results:  Lab Results  Component Value Date   WBC 9.2 10/31/2014   HGB 12.0 10/31/2014   HCT 36.8 10/31/2014   MCV 91.1 10/31/2014   PLT 180 10/31/2014   NEUTROABS 7.4* 10/31/2014    Lab Results  Component Value Date   CEA 2.0 06/01/2014    Medications: I have reviewed the patient's current medications.  Assessment/Plan: 1. Metastatic rectal cancer diagnosed July 2014.  Colonoscopy on 10/25/2012 showed a bulky circumferential friable mass lesion in the rectum at approximately 4 cm above the dentate line. The mass was subtotally obstructing and would not permit passage of the colonoscope proximal. Biopsy showed adenocarcinoma.   Staging CT scans 10/25/2012 showed a right lower lobe lung mass measuring 5 x 4.6 cm; 0.8 cm nodule right upper lobe; masslike rectosigmoid wall thickening, surrounding stranding and adjacent lymphadenopathy.   PET scan 11/02/2012 showed hypermetabolism in the area of apparent circumferential rectal wall thickening; the 5 cm right lower lobe pulmonary mass was hypermetabolic with SUV max equal 8. No other hypermetabolic lung lesions were evident. Small bilateral pulmonary parenchymal nodules were  noted.   She completed 5 cycles of FOLFOX chemotherapy 11/22/2012 through 02/13/2013.   Status post right thoracotomy, bilobectomy (right lower and middle lobectomy) with en bloc resection of the diaphragm 02/27/2013. Pathology showed adenocarcinoma consistent with metastatic colorectal adenocarcinoma, 4.3 cm, negative margins, no lymph node involvement. Negative soft tissue biopsy of the diaphragm x2. Positive for K-ras mutation-codon 13   She completed concurrent radiation and Xeloda for treatment of the rectal tumor 03/06/2013 through 04/11/2013.   Status post low anterior resection with a diverting loop ileostomy on 05/31/2013 at [facility] in [place]. Final pathology showed moderately differentiated adenocarcinoma located in the upper rectum; the tumor measured 2.5 x 2.2 cm; there was invasion through the muscularis propria into pericolic adipose tissue; tumor budding was present. Lymphatic status negative; vascular status negative; proximal and distal surgical margins negative; 2 of 17 lymph nodes showed metastatic adenocarcinoma. Mesenteric tumor deposits were present. Perineural invasion present. [facility] an additional positive [facility] node and the tumor was staged as a T4a   CT abdomen/pelvis 05/12/2013. Metastatic nodules in the lung increased. Chronic loculated right base pneumothorax. Diffuse mucosal thickening of the sigmoid portion of the colon.   Restaging CTs 07/25/2013 with enlargement of bilateral lung nodules, no other evidence of metastatic disease   Ileostomy takedown 08/14/2013   Chest CT 10/02/2013 revealed enlargement of bilateral lung nodules and increased right pleural fluid   Cycle 1 of FOLFIRI/Avastin 10/03/2013   Cycle 6 of FOLFIRI/Avastin 12/12/2013   Restaging CT 12/22/2013 with a decrease in the size of bilateral lung nodules, no evidence of disease progression   Cycle 7 FOLFIRI/Avastin 12/25/2013  Cycle 8  FOLFIRI/Avastin 01/10/2014  Cycle 9 FOLFIRI/Avastin 01/24/2014  Cycle 10 FOLFIRI/Avastin 02/07/2014  Cycle of 11 FOLFIRI/Avastin 02/21/2014  Restaging CT 03/05/2014 with a decrease in the size of 2 dominant right lung nodules, no new nodules, new pleural gas collection at the right apex  Cycle 12 FOLFIRI/Avastin 03/07/2014  Cycle 13 FOLFIRI/Avastin 03/20/2014  Cycle 14 FOLFIRI/Avastin 04/04/2014  Initiation of maintenance Xeloda/Avastin 04/18/2014  CT chest 07/09/2014 revealed slight enlargement of lung nodules  Treatment with single agent Avastin 07/10/2014  Resumption of FOLFIRI/Avastin on a 2 week schedule 07/25/2014  CT angio chest 09/14/2014 with essentially stable multifocal pulmonary metastatic disease.  5-fluorouracil dose reduced beginning 09/19/2014  FOLFIRI/Avastin switch to a 3 week schedule beginning with treatment 10/10/2014 secondary to malaise, diarrhea, and mucositis, irinotecan dose reduced 2. Status post right VATS, drainage of pleural effusion, visceral and parietal pleural decortication 12/21/2012. Pleural fluid culture positive for viridans Streptococcus. Right pleura biopsies negative for malignancy. Findings favored empyema. 3. Diabetes. 4. Frequent bowel movements, diminished pelvic muscle tone-we made a referral to the pelvic physical therapy clinic 5. Pain in the left leg with tenderness at the left calf and left ischium-MRI of the lumbar spine and pelvis on 10/13/2011 with no evidence of metastatic disease. A right iliac bone "lesion" was felt to most likely represent heterogenous marrow. Edema and enhancement was noted at the left sciatic nerve compared to the right side with no mass lesion 6. Hypertension-on HCTZ 7. Left foot fracture 8. BRIP1 mutation 9. Mucositis most likely related to 5-fluorouracil. Dose reduction of 5 fluorouracil beginning 09/19/2014.  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 She is tolerating the chemotherapy well at a 3  week interval and with a dose reduction of the irinotecan. The plan is to continue FOLFIRI/Avastin every 3 weeks. She will return for an office visit and chemotherapy in 3 weeks. We will plan for a restaging chest CT in October.  Betsy Coder, MD  10/31/2014  8:32 AM

## 2014-11-02 ENCOUNTER — Ambulatory Visit (HOSPITAL_BASED_OUTPATIENT_CLINIC_OR_DEPARTMENT_OTHER): Payer: 59

## 2014-11-02 ENCOUNTER — Ambulatory Visit: Payer: 59

## 2014-11-02 VITALS — BP 139/68 | HR 77 | Temp 98.5°F | Resp 18

## 2014-11-02 DIAGNOSIS — Z5189 Encounter for other specified aftercare: Secondary | ICD-10-CM

## 2014-11-02 DIAGNOSIS — C20 Malignant neoplasm of rectum: Secondary | ICD-10-CM | POA: Diagnosis not present

## 2014-11-02 DIAGNOSIS — C7801 Secondary malignant neoplasm of right lung: Secondary | ICD-10-CM

## 2014-11-02 DIAGNOSIS — C7800 Secondary malignant neoplasm of unspecified lung: Principal | ICD-10-CM

## 2014-11-02 DIAGNOSIS — C7802 Secondary malignant neoplasm of left lung: Secondary | ICD-10-CM

## 2014-11-02 MED ORDER — HEPARIN SOD (PORK) LOCK FLUSH 100 UNIT/ML IV SOLN
500.0000 [IU] | Freq: Once | INTRAVENOUS | Status: AC | PRN
  Administered 2014-11-02: 500 [IU]
  Filled 2014-11-02: qty 5

## 2014-11-02 MED ORDER — PEGFILGRASTIM INJECTION 6 MG/0.6ML ~~LOC~~
6.0000 mg | PREFILLED_SYRINGE | Freq: Once | SUBCUTANEOUS | Status: AC
  Administered 2014-11-02: 6 mg via SUBCUTANEOUS
  Filled 2014-11-02: qty 0.6

## 2014-11-02 MED ORDER — SODIUM CHLORIDE 0.9 % IJ SOLN
10.0000 mL | INTRAMUSCULAR | Status: DC | PRN
  Administered 2014-11-02: 10 mL
  Filled 2014-11-02: qty 10

## 2014-11-02 NOTE — Progress Notes (Signed)
Neulasta injection given by flush nurse 

## 2014-11-02 NOTE — Patient Instructions (Signed)
[place] Discharge Instructions for Patients Receiving Chemotherapy  This week you received the following chemotherapy agents irinotecan, 5 fu, leucovorin To help prevent nausea and vomiting after your treatment, we encourage you to take your nausea medication    If you develop nausea and vomiting that is not controlled by your nausea medication, call the clinic.   BELOW ARE SYMPTOMS THAT SHOULD BE REPORTED IMMEDIATELY:  *FEVER GREATER THAN 100.5 F  *CHILLS WITH OR WITHOUT FEVER  NAUSEA AND VOMITING THAT IS NOT CONTROLLED WITH YOUR NAUSEA MEDICATION  *UNUSUAL SHORTNESS OF BREATH  *UNUSUAL BRUISING OR BLEEDING  TENDERNESS IN MOUTH AND THROAT WITH OR WITHOUT PRESENCE OF ULCERS  *URINARY PROBLEMS  *BOWEL PROBLEMS  UNUSUAL RASH Items with * indicate a potential emergency and should be followed up as soon as possible.  Feel free to call the clinic you have any questions or concerns. The clinic phone number is [phone].  Please show the [place] at check-in to the Emergency Department and triage nurse.

## 2014-11-18 ENCOUNTER — Other Ambulatory Visit: Payer: Self-pay | Admitting: Oncology

## 2014-11-20 ENCOUNTER — Telehealth: Payer: Self-pay | Admitting: Oncology

## 2014-11-20 NOTE — Telephone Encounter (Signed)
Pt called, I called back confirmed labs/ov/chemo .... Cherylann Banas

## 2014-11-21 ENCOUNTER — Telehealth: Payer: Self-pay | Admitting: Oncology

## 2014-11-21 ENCOUNTER — Other Ambulatory Visit (HOSPITAL_BASED_OUTPATIENT_CLINIC_OR_DEPARTMENT_OTHER): Payer: 59

## 2014-11-21 ENCOUNTER — Ambulatory Visit (HOSPITAL_BASED_OUTPATIENT_CLINIC_OR_DEPARTMENT_OTHER): Payer: 59 | Admitting: Oncology

## 2014-11-21 ENCOUNTER — Ambulatory Visit (HOSPITAL_BASED_OUTPATIENT_CLINIC_OR_DEPARTMENT_OTHER): Payer: 59

## 2014-11-21 VITALS — BP 171/78 | HR 82 | Temp 98.8°F | Resp 18 | Ht 63.0 in | Wt 126.2 lb

## 2014-11-21 VITALS — BP 160/73 | HR 87

## 2014-11-21 DIAGNOSIS — C7802 Secondary malignant neoplasm of left lung: Secondary | ICD-10-CM

## 2014-11-21 DIAGNOSIS — C7801 Secondary malignant neoplasm of right lung: Secondary | ICD-10-CM

## 2014-11-21 DIAGNOSIS — C20 Malignant neoplasm of rectum: Secondary | ICD-10-CM | POA: Diagnosis not present

## 2014-11-21 DIAGNOSIS — C7800 Secondary malignant neoplasm of unspecified lung: Principal | ICD-10-CM

## 2014-11-21 DIAGNOSIS — K1231 Oral mucositis (ulcerative) due to antineoplastic therapy: Secondary | ICD-10-CM | POA: Diagnosis not present

## 2014-11-21 DIAGNOSIS — Z5111 Encounter for antineoplastic chemotherapy: Secondary | ICD-10-CM

## 2014-11-21 DIAGNOSIS — E119 Type 2 diabetes mellitus without complications: Secondary | ICD-10-CM

## 2014-11-21 DIAGNOSIS — Z5112 Encounter for antineoplastic immunotherapy: Secondary | ICD-10-CM | POA: Diagnosis not present

## 2014-11-21 DIAGNOSIS — I10 Essential (primary) hypertension: Secondary | ICD-10-CM

## 2014-11-21 LAB — CBC WITH DIFFERENTIAL/PLATELET
BASO%: 0.6 % (ref 0.0–2.0)
Basophils Absolute: 0.1 10*3/uL (ref 0.0–0.1)
EOS%: 1.2 % (ref 0.0–7.0)
Eosinophils Absolute: 0.1 10*3/uL (ref 0.0–0.5)
HCT: 37.8 % (ref 34.8–46.6)
HEMOGLOBIN: 12.2 g/dL (ref 11.6–15.9)
LYMPH%: 9 % — AB (ref 14.0–49.7)
MCH: 29.3 pg (ref 25.1–34.0)
MCHC: 32.3 g/dL (ref 31.5–36.0)
MCV: 90.6 fL (ref 79.5–101.0)
MONO#: 0.5 10*3/uL (ref 0.1–0.9)
MONO%: 4.2 % (ref 0.0–14.0)
NEUT%: 85 % — ABNORMAL HIGH (ref 38.4–76.8)
NEUTROS ABS: 10 10*3/uL — AB (ref 1.5–6.5)
Platelets: 220 10*3/uL (ref 145–400)
RBC: 4.17 10*6/uL (ref 3.70–5.45)
RDW: 17.9 % — ABNORMAL HIGH (ref 11.2–14.5)
WBC: 11.7 10*3/uL — AB (ref 3.9–10.3)
lymph#: 1.1 10*3/uL (ref 0.9–3.3)

## 2014-11-21 LAB — UA PROTEIN, DIPSTICK - CHCC: Protein, ur: <30 mg/dL

## 2014-11-21 LAB — COMPREHENSIVE METABOLIC PANEL (CC13)
ALBUMIN: 4.2 g/dL (ref 3.5–5.0)
ALK PHOS: 72 U/L (ref 40–150)
ALT: 34 U/L (ref 0–55)
AST: 19 U/L (ref 5–34)
Anion Gap: 12 mEq/L — ABNORMAL HIGH (ref 3–11)
BUN: 21.5 mg/dL (ref 7.0–26.0)
CO2: 27 meq/L (ref 22–29)
CREATININE: 0.7 mg/dL (ref 0.6–1.1)
Calcium: 10.4 mg/dL (ref 8.4–10.4)
Chloride: 101 mEq/L (ref 98–109)
EGFR: 84 mL/min/{1.73_m2} — AB (ref >90)
GLUCOSE: 148 mg/dL — AB (ref 70–140)
Potassium: 3.9 mEq/L (ref 3.5–5.1)
SODIUM: 140 meq/L (ref 136–145)
TOTAL PROTEIN: 6.8 g/dL (ref 6.4–8.3)
Total Bilirubin: 0.51 mg/dL (ref 0.20–1.20)

## 2014-11-21 MED ORDER — ATROPINE SULFATE 1 MG/ML IJ SOLN
0.5000 mg | Freq: Once | INTRAMUSCULAR | Status: AC | PRN
  Administered 2014-11-21: 0.5 mg via INTRAVENOUS

## 2014-11-21 MED ORDER — SODIUM CHLORIDE 0.9 % IJ SOLN
10.0000 mL | INTRAMUSCULAR | Status: DC | PRN
  Filled 2014-11-21: qty 10

## 2014-11-21 MED ORDER — ATROPINE SULFATE 1 MG/ML IJ SOLN
INTRAMUSCULAR | Status: AC
  Filled 2014-11-21: qty 1

## 2014-11-21 MED ORDER — LEUCOVORIN CALCIUM INJECTION 350 MG
175.0000 mg/m2 | Freq: Once | INTRAVENOUS | Status: AC
  Administered 2014-11-21: 286 mg via INTRAVENOUS
  Filled 2014-11-21: qty 14.3

## 2014-11-21 MED ORDER — SODIUM CHLORIDE 0.9 % IV SOLN
Freq: Once | INTRAVENOUS | Status: AC
  Administered 2014-11-21: 10:00:00 via INTRAVENOUS
  Filled 2014-11-21: qty 8

## 2014-11-21 MED ORDER — SODIUM CHLORIDE 0.9 % IV SOLN
7.0000 mg/kg | Freq: Once | INTRAVENOUS | Status: AC
  Administered 2014-11-21: 400 mg via INTRAVENOUS
  Filled 2014-11-21: qty 16

## 2014-11-21 MED ORDER — SODIUM CHLORIDE 0.9 % IV SOLN
Freq: Once | INTRAVENOUS | Status: AC
  Administered 2014-11-21: 09:00:00 via INTRAVENOUS

## 2014-11-21 MED ORDER — SODIUM CHLORIDE 0.9 % IV SOLN
1680.0000 mg/m2 | INTRAVENOUS | Status: DC
  Administered 2014-11-21: 2750 mg via INTRAVENOUS
  Filled 2014-11-21: qty 55

## 2014-11-21 MED ORDER — HEPARIN SOD (PORK) LOCK FLUSH 100 UNIT/ML IV SOLN
500.0000 [IU] | Freq: Once | INTRAVENOUS | Status: DC | PRN
  Filled 2014-11-21: qty 5

## 2014-11-21 MED ORDER — FLUOROURACIL CHEMO INJECTION 500 MG/10ML
175.0000 mg/m2 | Freq: Once | INTRAVENOUS | Status: AC
  Administered 2014-11-21: 300 mg via INTRAVENOUS
  Filled 2014-11-21: qty 6

## 2014-11-21 MED ORDER — DEXTROSE 5 % IV SOLN
135.0000 mg/m2 | Freq: Once | INTRAVENOUS | Status: AC
  Administered 2014-11-21: 220 mg via INTRAVENOUS
  Filled 2014-11-21: qty 11

## 2014-11-21 NOTE — Progress Notes (Signed)
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 She completed another cycle of FOLFIRI/Avastin 10/31/2014. She tolerated the chemotherapy well. She took a trip to the beach. She reports intermittent tachycardia. No bleeding or symptom of thrombosis.  Objective:  Vital signs in last 24 hours:  Blood pressure 171/78, pulse 82, temperature 98.8 F (37.1 C), temperature source Oral, resp. rate 18, height 5' 3" (1.6 m), weight 126 lb 3.2 oz (57.244 kg), SpO2 98 %.    HEENT: No thrush or ulcers Resp: Decreased breath sounds at the right lower chest, no respiratory distress Cardio: Regular rate and rhythm GI: No hepatomegaly, nontender Vascular: No leg edema  Skin: Mild palmar erythema   Portacath/PICC-without erythema  Lab Results:  Lab Results  Component Value Date   WBC 11.7* 11/21/2014   HGB 12.2 11/21/2014   HCT 37.8 11/21/2014   MCV 90.6 11/21/2014   PLT 220 11/21/2014   NEUTROABS 10.0* 11/21/2014      Lab Results  Component Value Date   CEA 2.0 06/01/2014     Medications: I have reviewed the patient's current medications.  Assessment/Plan: 1. Metastatic rectal cancer diagnosed July 2014.  Colonoscopy on 10/25/2012 showed a bulky circumferential friable mass lesion in the rectum at approximately 4 cm above the dentate line. The mass was subtotally obstructing and would not permit passage of the colonoscope proximal. Biopsy showed adenocarcinoma.   Staging CT scans 10/25/2012 showed a right lower lobe lung mass measuring 5 x 4.6 cm; 0.8 cm nodule right upper lobe; masslike rectosigmoid wall thickening, surrounding stranding and adjacent lymphadenopathy.   PET scan 11/02/2012 showed hypermetabolism in the area of apparent circumferential rectal wall thickening; the 5 cm right lower lobe pulmonary mass was hypermetabolic with SUV max equal 8. No other hypermetabolic lung lesions  were evident. Small bilateral pulmonary parenchymal nodules were noted.   She completed 5 cycles of FOLFOX chemotherapy 11/22/2012 through 02/13/2013.   Status post right thoracotomy, bilobectomy (right lower and middle lobectomy) with en bloc resection of the diaphragm 02/27/2013. Pathology showed adenocarcinoma consistent with metastatic colorectal adenocarcinoma, 4.3 cm, negative margins, no lymph node involvement. Negative soft tissue biopsy of the diaphragm x2. Positive for K-ras mutation-codon 13   She completed concurrent radiation and Xeloda for treatment of the rectal tumor 03/06/2013 through 04/11/2013.   Status post low anterior resection with a diverting loop ileostomy on 05/31/2013 at [facility] in [place]. Final pathology showed moderately differentiated adenocarcinoma located in the upper rectum; the tumor measured 2.5 x 2.2 cm; there was invasion through the muscularis propria into pericolic adipose tissue; tumor budding was present. Lymphatic status negative; vascular status negative; proximal and distal surgical margins negative; 2 of 17 lymph nodes showed metastatic adenocarcinoma. Mesenteric tumor deposits were present. Perineural invasion present. [facility] confirmed an additional positive lymph node and the tumor was staged as a T4a   CT abdomen/pelvis 05/12/2013. Metastatic nodules in the lung increased. Chronic loculated right base pneumothorax. Diffuse mucosal thickening of the sigmoid portion of the colon.   Restaging CTs 07/25/2013 with enlargement of bilateral lung nodules, no other evidence of metastatic disease   Ileostomy takedown 08/14/2013   Chest CT 10/02/2013 revealed enlargement of bilateral lung nodules and increased right pleural fluid   Cycle 1 of FOLFIRI/Avastin 10/03/2013   Cycle 6 of FOLFIRI/Avastin 12/12/2013   Restaging CT 12/22/2013 with a decrease in the size of bilateral lung nodules, no evidence  of disease progression    Cycle 7 FOLFIRI/Avastin 12/25/2013  Cycle 8 FOLFIRI/Avastin 01/10/2014  Cycle 9 FOLFIRI/Avastin 01/24/2014  Cycle 10 FOLFIRI/Avastin 02/07/2014  Cycle of 11 FOLFIRI/Avastin 02/21/2014  Restaging CT 03/05/2014 with a decrease in the size of 2 dominant right lung nodules, no new nodules, new pleural gas collection at the right apex  Cycle 12 FOLFIRI/Avastin 03/07/2014  Cycle 13 FOLFIRI/Avastin 03/20/2014  Cycle 14 FOLFIRI/Avastin 04/04/2014  Initiation of maintenance Xeloda/Avastin 04/18/2014  CT chest 07/09/2014 revealed slight enlargement of lung nodules  Treatment with single agent Avastin 07/10/2014  Resumption of FOLFIRI/Avastin on a 2 week schedule 07/25/2014  CT angio chest 09/14/2014 with essentially stable multifocal pulmonary metastatic disease.  5-fluorouracil dose reduced beginning 09/19/2014  FOLFIRI/Avastin switch to a 3 week schedule beginning with treatment 10/10/2014 secondary to malaise, diarrhea, and mucositis, irinotecan dose reduced 2. Status post right VATS, drainage of pleural effusion, visceral and parietal pleural decortication 12/21/2012. Pleural fluid culture positive for viridans Streptococcus. Right pleura biopsies negative for malignancy. Findings favored empyema. 3. Diabetes. 4. Frequent bowel movements, diminished pelvic muscle tone-we made a referral to the pelvic physical therapy clinic 5. Pain in the left leg with tenderness at the left calf and left ischium-MRI of the lumbar spine and pelvis on 10/13/2011 with no evidence of metastatic disease. A right iliac bone "lesion" was felt to most likely represent heterogenous marrow. Edema and enhancement was noted at the left sciatic nerve compared to the right side with no mass lesion 6. Hypertension-on HCTZ 7. Left foot fracture 8. BRIP1 mutation 9. Mucositis most likely related to 5-fluorouracil. Dose reduction of 5 fluorouracil beginning 09/19/2014.    Disposition:  Dr. [name] appears  stable. The plan is to continue FOLFIRI/Avastin on a 3 week schedule. She will complete another cycle today. She will return for an office visit and chemotherapy in 3 weeks. She will be scheduled for a restaging chest CT after the cycle of chemotherapy 12/12/2014.  Betsy Coder, MD  11/21/2014  8:57 AM

## 2014-11-21 NOTE — Telephone Encounter (Signed)
Added appt per pof pt will get new sched in chemo

## 2014-11-21 NOTE — Patient Instructions (Signed)
[place] [facility] Discharge Instructions for Patients Receiving Chemotherapy  Today you received the following chemotherapy agents:  Avastin, camptosar,Leucovorin, 5-FU  To help prevent nausea and vomiting after your treatment, we encourage you to take your nausea medication.   If you develop nausea and vomiting that is not controlled by your nausea medication, call the clinic.   BELOW ARE SYMPTOMS THAT SHOULD BE REPORTED IMMEDIATELY:  *FEVER GREATER THAN 100.5 F  *CHILLS WITH OR WITHOUT FEVER  NAUSEA AND VOMITING THAT IS NOT CONTROLLED WITH YOUR NAUSEA MEDICATION  *UNUSUAL SHORTNESS OF BREATH  *UNUSUAL BRUISING OR BLEEDING  TENDERNESS IN MOUTH AND THROAT WITH OR WITHOUT PRESENCE OF ULCERS  *URINARY PROBLEMS  *BOWEL PROBLEMS  UNUSUAL RASH Items with * indicate a potential emergency and should be followed up as soon as possible.  Feel free to call the clinic you have any questions or concerns. The clinic phone number is [phone].  Please show the [place] at check-in to the Emergency Department and triage nurse.

## 2014-11-23 ENCOUNTER — Ambulatory Visit: Payer: 59

## 2014-11-23 ENCOUNTER — Other Ambulatory Visit: Payer: Self-pay | Admitting: Oncology

## 2014-11-23 ENCOUNTER — Ambulatory Visit (HOSPITAL_BASED_OUTPATIENT_CLINIC_OR_DEPARTMENT_OTHER): Payer: 59

## 2014-11-23 VITALS — BP 144/70 | HR 80 | Temp 97.7°F | Resp 20

## 2014-11-23 DIAGNOSIS — C20 Malignant neoplasm of rectum: Secondary | ICD-10-CM

## 2014-11-23 DIAGNOSIS — C7801 Secondary malignant neoplasm of right lung: Secondary | ICD-10-CM | POA: Diagnosis not present

## 2014-11-23 DIAGNOSIS — C7800 Secondary malignant neoplasm of unspecified lung: Principal | ICD-10-CM

## 2014-11-23 DIAGNOSIS — C7802 Secondary malignant neoplasm of left lung: Secondary | ICD-10-CM

## 2014-11-23 MED ORDER — PEGFILGRASTIM INJECTION 6 MG/0.6ML ~~LOC~~
6.0000 mg | PREFILLED_SYRINGE | Freq: Once | SUBCUTANEOUS | Status: AC
  Administered 2014-11-23: 6 mg via SUBCUTANEOUS
  Filled 2014-11-23: qty 0.6

## 2014-11-23 MED ORDER — HEPARIN SOD (PORK) LOCK FLUSH 100 UNIT/ML IV SOLN
500.0000 [IU] | Freq: Once | INTRAVENOUS | Status: AC | PRN
  Administered 2014-11-23: 500 [IU]
  Filled 2014-11-23: qty 5

## 2014-11-23 MED ORDER — SODIUM CHLORIDE 0.9 % IJ SOLN
10.0000 mL | INTRAMUSCULAR | Status: DC | PRN
  Administered 2014-11-23: 10 mL
  Filled 2014-11-23: qty 10

## 2014-11-23 NOTE — Patient Instructions (Signed)

## 2014-11-23 NOTE — Progress Notes (Signed)
Neulasta injection given by [facility] nurse,

## 2014-11-26 ENCOUNTER — Other Ambulatory Visit: Payer: Self-pay | Admitting: Oncology

## 2014-12-05 ENCOUNTER — Other Ambulatory Visit: Payer: Self-pay | Admitting: Oncology

## 2014-12-05 ENCOUNTER — Other Ambulatory Visit: Payer: Self-pay | Admitting: Nurse Practitioner

## 2014-12-05 DIAGNOSIS — C20 Malignant neoplasm of rectum: Secondary | ICD-10-CM

## 2014-12-05 MED ORDER — OXYCODONE-ACETAMINOPHEN 10-325 MG PO TABS: 1.0000 | ORAL_TABLET | ORAL | Status: DC | PRN

## 2014-12-12 ENCOUNTER — Ambulatory Visit (HOSPITAL_BASED_OUTPATIENT_CLINIC_OR_DEPARTMENT_OTHER): Payer: 59 | Admitting: Nurse Practitioner

## 2014-12-12 ENCOUNTER — Other Ambulatory Visit (HOSPITAL_BASED_OUTPATIENT_CLINIC_OR_DEPARTMENT_OTHER): Payer: 59

## 2014-12-12 ENCOUNTER — Encounter: Payer: Self-pay | Admitting: Nurse Practitioner

## 2014-12-12 ENCOUNTER — Ambulatory Visit (HOSPITAL_BASED_OUTPATIENT_CLINIC_OR_DEPARTMENT_OTHER): Payer: 59

## 2014-12-12 VITALS — BP 162/88 | HR 83 | Temp 98.4°F | Resp 18 | Ht 63.0 in | Wt 126.4 lb

## 2014-12-12 VITALS — BP 145/68 | HR 78

## 2014-12-12 DIAGNOSIS — C7801 Secondary malignant neoplasm of right lung: Secondary | ICD-10-CM

## 2014-12-12 DIAGNOSIS — C7802 Secondary malignant neoplasm of left lung: Secondary | ICD-10-CM

## 2014-12-12 DIAGNOSIS — Z5111 Encounter for antineoplastic chemotherapy: Secondary | ICD-10-CM

## 2014-12-12 DIAGNOSIS — C7800 Secondary malignant neoplasm of unspecified lung: Principal | ICD-10-CM

## 2014-12-12 DIAGNOSIS — C20 Malignant neoplasm of rectum: Secondary | ICD-10-CM | POA: Diagnosis not present

## 2014-12-12 DIAGNOSIS — Z5112 Encounter for antineoplastic immunotherapy: Secondary | ICD-10-CM | POA: Diagnosis not present

## 2014-12-12 DIAGNOSIS — I10 Essential (primary) hypertension: Secondary | ICD-10-CM | POA: Diagnosis not present

## 2014-12-12 DIAGNOSIS — E119 Type 2 diabetes mellitus without complications: Secondary | ICD-10-CM

## 2014-12-12 LAB — CBC WITH DIFFERENTIAL/PLATELET
BASO%: 1.4 % (ref 0.0–2.0)
BASOS ABS: 0.1 10*3/uL (ref 0.0–0.1)
EOS%: 2.3 % (ref 0.0–7.0)
Eosinophils Absolute: 0.2 10*3/uL (ref 0.0–0.5)
HEMATOCRIT: 39.8 % (ref 34.8–46.6)
HEMOGLOBIN: 13.3 g/dL (ref 11.6–15.9)
LYMPH#: 1.1 10*3/uL (ref 0.9–3.3)
LYMPH%: 13.6 % — ABNORMAL LOW (ref 14.0–49.7)
MCH: 29.9 pg (ref 25.1–34.0)
MCHC: 33.3 g/dL (ref 31.5–36.0)
MCV: 89.7 fL (ref 79.5–101.0)
MONO#: 0.5 10*3/uL (ref 0.1–0.9)
MONO%: 5.9 % (ref 0.0–14.0)
NEUT#: 6 10*3/uL (ref 1.5–6.5)
NEUT%: 76.8 % (ref 38.4–76.8)
PLATELETS: 190 10*3/uL (ref 145–400)
RBC: 4.44 10*6/uL (ref 3.70–5.45)
RDW: 17.4 % — AB (ref 11.2–14.5)
WBC: 7.8 10*3/uL (ref 3.9–10.3)

## 2014-12-12 LAB — COMPREHENSIVE METABOLIC PANEL (CC13)
ALBUMIN: 3.9 g/dL (ref 3.5–5.0)
ALK PHOS: 62 U/L (ref 40–150)
ALT: 23 U/L (ref 0–55)
ANION GAP: 11 meq/L (ref 3–11)
AST: 18 U/L (ref 5–34)
BUN: 15.8 mg/dL (ref 7.0–26.0)
CALCIUM: 10.8 mg/dL — AB (ref 8.4–10.4)
CO2: 29 mEq/L (ref 22–29)
CREATININE: 0.7 mg/dL (ref 0.6–1.1)
Chloride: 102 mEq/L (ref 98–109)
EGFR: 84 mL/min/{1.73_m2} — ABNORMAL LOW (ref >90)
Glucose: 159 mg/dl — ABNORMAL HIGH (ref 70–140)
Potassium: 4 mEq/L (ref 3.5–5.1)
Sodium: 141 mEq/L (ref 136–145)
Total Bilirubin: 0.38 mg/dL (ref 0.20–1.20)
Total Protein: 6.8 g/dL (ref 6.4–8.3)

## 2014-12-12 LAB — UA PROTEIN, DIPSTICK - CHCC: PROTEIN: NEGATIVE mg/dL

## 2014-12-12 MED ORDER — AMLODIPINE BESYLATE 5 MG PO TABS: 5.0000 mg | ORAL_TABLET | Freq: Every day | ORAL | Status: DC

## 2014-12-12 MED ORDER — ATROPINE SULFATE 1 MG/ML IJ SOLN
0.5000 mg | Freq: Once | INTRAMUSCULAR | Status: AC | PRN
Start: 2014-12-12 — End: 2014-12-12
  Administered 2014-12-12: 0.5 mg via INTRAVENOUS

## 2014-12-12 MED ORDER — IRINOTECAN HCL CHEMO INJECTION 100 MG/5ML
135.0000 mg/m2 | Freq: Once | INTRAVENOUS | Status: AC
  Administered 2014-12-12: 220 mg via INTRAVENOUS
  Filled 2014-12-12: qty 11

## 2014-12-12 MED ORDER — SODIUM CHLORIDE 0.9 % IV SOLN
1680.0000 mg/m2 | INTRAVENOUS | Status: DC
  Administered 2014-12-12: 2750 mg via INTRAVENOUS
  Filled 2014-12-12: qty 55

## 2014-12-12 MED ORDER — SODIUM CHLORIDE 0.9 % IV SOLN
7.3000 mg/kg | Freq: Once | INTRAVENOUS | Status: AC
  Administered 2014-12-12: 400 mg via INTRAVENOUS
  Filled 2014-12-12: qty 16

## 2014-12-12 MED ORDER — SODIUM CHLORIDE 0.9 % IV SOLN
Freq: Once | INTRAVENOUS | Status: AC
  Administered 2014-12-12: 10:00:00 via INTRAVENOUS

## 2014-12-12 MED ORDER — SODIUM CHLORIDE 0.9 % IJ SOLN
10.0000 mL | INTRAMUSCULAR | Status: DC | PRN
  Filled 2014-12-12: qty 10

## 2014-12-12 MED ORDER — FLUOROURACIL CHEMO INJECTION 500 MG/10ML
175.0000 mg/m2 | Freq: Once | INTRAVENOUS | Status: AC
  Administered 2014-12-12: 300 mg via INTRAVENOUS
  Filled 2014-12-12: qty 6

## 2014-12-12 MED ORDER — ATROPINE SULFATE 1 MG/ML IJ SOLN
INTRAMUSCULAR | Status: AC
  Filled 2014-12-12: qty 1

## 2014-12-12 MED ORDER — HEPARIN SOD (PORK) LOCK FLUSH 100 UNIT/ML IV SOLN
500.0000 [IU] | Freq: Once | INTRAVENOUS | Status: DC | PRN
  Filled 2014-12-12: qty 5

## 2014-12-12 MED ORDER — SODIUM CHLORIDE 0.9 % IV SOLN
Freq: Once | INTRAVENOUS | Status: AC
  Administered 2014-12-12: 11:00:00 via INTRAVENOUS
  Filled 2014-12-12: qty 8

## 2014-12-12 MED ORDER — LEUCOVORIN CALCIUM INJECTION 350 MG
175.0000 mg/m2 | Freq: Once | INTRAVENOUS | Status: AC
  Administered 2014-12-12: 286 mg via INTRAVENOUS
  Filled 2014-12-12: qty 14.3

## 2014-12-12 NOTE — Progress Notes (Signed)
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 She completed another cycle of FOLFIRI/Avastin beginning 11/21/2014. She denies nausea/vomiting. No mouth sores. No diarrhea. No bleeding. No shortness of breath. For many months she has had an occasional "ache" at the left chest that resolves spontaneously and has no associated symptoms. She denies leg swelling and calf pain.  Objective:  Vital signs in last 24 hours:  Blood pressure 162/88, pulse 83, temperature 98.4 F (36.9 C), temperature source Oral, resp. rate 18, height $RemoveBe'5\' 3"'oxuurZllb$  (1.6 m), weight 126 lb 6.4 oz (57.335 kg), SpO2 100 %.    HEENT: No thrush or ulcers. Resp: Lungs clear bilaterally. Breath sounds are diminished at the right lower chest. No respiratory distress. Cardio: Regular rate and rhythm. GI: Abdomen soft and nontender. No hepatomegaly. Vascular: No leg edema. Calves soft and nontender. Port-A-Cath without erythema.  Lab Results:  Lab Results  Component Value Date   WBC 7.8 12/12/2014   HGB 13.3 12/12/2014   HCT 39.8 12/12/2014   MCV 89.7 12/12/2014   PLT 190 12/12/2014   NEUTROABS 6.0 12/12/2014    Imaging:  No results found.  Medications: I have reviewed the patient's current medications.  Assessment/Plan: 1. Metastatic rectal cancer diagnosed July 2014.  Colonoscopy on 10/25/2012 showed a bulky circumferential friable mass lesion in the rectum at approximately 4 cm above the dentate line. The mass was subtotally obstructing and would not permit passage of the colonoscope proximal. Biopsy showed adenocarcinoma.   Staging CT scans 10/25/2012 showed a right lower lobe lung mass measuring 5 x 4.6 cm; 0.8 cm nodule right upper lobe; masslike rectosigmoid wall thickening, surrounding stranding and adjacent lymphadenopathy.   PET scan 11/02/2012 showed hypermetabolism in the area of apparent circumferential rectal wall  thickening; the 5 cm right lower lobe pulmonary mass was hypermetabolic with SUV max equal 8. No other hypermetabolic lung lesions were evident. Small bilateral pulmonary parenchymal nodules were noted.   She completed 5 cycles of FOLFOX chemotherapy 11/22/2012 through 02/13/2013.   Status post right thoracotomy, bilobectomy (right lower and middle lobectomy) with en bloc resection of the diaphragm 02/27/2013. Pathology showed adenocarcinoma consistent with metastatic colorectal adenocarcinoma, 4.3 cm, negative margins, no lymph node involvement. Negative soft tissue biopsy of the diaphragm x2. Positive for K-ras mutation-codon 13   She completed concurrent radiation and Xeloda for treatment of the rectal tumor 03/06/2013 through 04/11/2013.   Status post low anterior resection with a diverting loop ileostomy on 05/31/2013 at [facility] in [place]. Final pathology showed moderately differentiated adenocarcinoma located in the upper rectum; the tumor measured 2.5 x 2.2 cm; there was invasion through the muscularis propria into pericolic adipose tissue; tumor budding was present. Lymphatic status negative; vascular status negative; proximal and distal surgical margins negative; 2 of 17 lymph nodes showed metastatic adenocarcinoma. Mesenteric tumor deposits were present. Perineural invasion present. [facility] confirmed an additional positive lymph node and the tumor was staged as a T4a   CT abdomen/pelvis 05/12/2013. Metastatic nodules in the lung increased. Chronic loculated right base pneumothorax. Diffuse mucosal thickening of the sigmoid portion of the colon.   Restaging CTs 07/25/2013 with enlargement of bilateral lung nodules, no other evidence of metastatic disease   Ileostomy takedown 08/14/2013   Chest CT 10/02/2013 revealed enlargement of bilateral lung nodules and increased right pleural fluid   Cycle 1 of FOLFIRI/Avastin 10/03/2013   Cycle 6 of FOLFIRI/Avastin  12/12/2013  Restaging CT 12/22/2013 with a decrease in the size of bilateral lung nodules, no evidence of disease progression   Cycle 7 FOLFIRI/Avastin 12/25/2013  Cycle 8 FOLFIRI/Avastin 01/10/2014  Cycle 9 FOLFIRI/Avastin 01/24/2014  Cycle 10 FOLFIRI/Avastin 02/07/2014  Cycle of 11 FOLFIRI/Avastin 02/21/2014  Restaging CT 03/05/2014 with a decrease in the size of 2 dominant right lung nodules, no new nodules, new pleural gas collection at the right apex  Cycle 12 FOLFIRI/Avastin 03/07/2014  Cycle 13 FOLFIRI/Avastin 03/20/2014  Cycle 14 FOLFIRI/Avastin 04/04/2014  Initiation of maintenance Xeloda/Avastin 04/18/2014  CT chest 07/09/2014 revealed slight enlargement of lung nodules  Treatment with single agent Avastin 07/10/2014  Resumption of FOLFIRI/Avastin on a 2 week schedule 07/25/2014  CT angio chest 09/14/2014 with essentially stable multifocal pulmonary metastatic disease.  5-fluorouracil dose reduced beginning 09/19/2014  FOLFIRI/Avastin switch to a 3 week schedule beginning with treatment 10/10/2014 secondary to malaise, diarrhea, and mucositis, irinotecan dose reduced 2. Status post right VATS, drainage of pleural effusion, visceral and parietal pleural decortication 12/21/2012. Pleural fluid culture positive for viridans Streptococcus. Right pleura biopsies negative for malignancy. Findings favored empyema. 3. Diabetes. 4. Frequent bowel movements, diminished pelvic muscle tone-we made a referral to the pelvic physical therapy clinic 5. Pain in the left leg with tenderness at the left calf and left ischium-MRI of the lumbar spine and pelvis on 10/13/2011 with no evidence of metastatic disease. A right iliac bone "lesion" was felt to most likely represent heterogenous marrow. Edema and enhancement was noted at the left sciatic nerve compared to the right side with no mass lesion 6. Hypertension-on HCTZ 7. Left foot fracture 8. BRIP1 mutation 9. Mucositis most  likely related to 5-fluorouracil. Dose reduction of 5 fluorouracil beginning 09/19/2014.   Disposition: 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 Plan to proceed with FOLFIRI/Avastin today as scheduled. Referral made for restaging chest CT in approximately 2 weeks.  Blood pressure now consistently mildly elevated. She reports she is not taking blood pressure medication. I sent a prescription to her pharmacy for Norvasc 5 mg daily.  She will return for a follow-up visit and the next cycle of FOLFIRI/Avastin in 3 weeks. She will contact the office in the interim with any problems.    Ned Card ANP/GNP-BC   12/12/2014  9:36 AM

## 2014-12-12 NOTE — Patient Instructions (Signed)
[facility] Discharge Instructions for Patients Receiving Chemotherapy  Today you received the following chemotherapy agents avastin, irinotecan, leukovorin, fluorouricil  To help prevent nausea and vomiting after your treatment, we encourage you to take your nausea medication   If you develop nausea and vomiting that is not controlled by your nausea medication, call the clinic.   BELOW ARE SYMPTOMS THAT SHOULD BE REPORTED IMMEDIATELY:  *FEVER GREATER THAN 100.5 F  *CHILLS WITH OR WITHOUT FEVER  NAUSEA AND VOMITING THAT IS NOT CONTROLLED WITH YOUR NAUSEA MEDICATION  *UNUSUAL SHORTNESS OF BREATH  *UNUSUAL BRUISING OR BLEEDING  TENDERNESS IN MOUTH AND THROAT WITH OR WITHOUT PRESENCE OF ULCERS  *URINARY PROBLEMS  *BOWEL PROBLEMS  UNUSUAL RASH Items with * indicate a potential emergency and should be followed up as soon as possible.  Feel free to call the clinic you have any questions or concerns. The clinic phone number is [phone].  Please show the [place] at check-in to the Emergency Department and triage nurse.

## 2014-12-13 ENCOUNTER — Telehealth: Payer: Self-pay | Admitting: *Deleted

## 2014-12-13 NOTE — Telephone Encounter (Signed)
Patient called concerned that her pump was going to run out in the middle of the night.  Her sister's pump has more in it and they got it close to the same time.  I was able to calculate both pumps and they are both going at the correct rate.  Anne [name] should be running at about 3.110m/hr and after approx. 25 hrs of the vol of 1524mshe should have about 7058mleft and she has 67.4.     Let her know that if it does finish before her time, to go ahead and come in.  She has an appt. At 10:45 for injection and 12noon for pump d/c.  She appreciated my figuring out the mathematical calculations for her pump.

## 2014-12-14 ENCOUNTER — Ambulatory Visit: Payer: 59

## 2014-12-14 ENCOUNTER — Ambulatory Visit (HOSPITAL_BASED_OUTPATIENT_CLINIC_OR_DEPARTMENT_OTHER): Payer: 59

## 2014-12-14 VITALS — BP 145/70 | HR 73 | Temp 98.4°F | Resp 18

## 2014-12-14 DIAGNOSIS — C7802 Secondary malignant neoplasm of left lung: Secondary | ICD-10-CM | POA: Diagnosis not present

## 2014-12-14 DIAGNOSIS — C7800 Secondary malignant neoplasm of unspecified lung: Principal | ICD-10-CM

## 2014-12-14 DIAGNOSIS — C7801 Secondary malignant neoplasm of right lung: Secondary | ICD-10-CM

## 2014-12-14 DIAGNOSIS — C20 Malignant neoplasm of rectum: Secondary | ICD-10-CM | POA: Diagnosis not present

## 2014-12-14 DIAGNOSIS — Z5189 Encounter for other specified aftercare: Secondary | ICD-10-CM

## 2014-12-14 MED ORDER — HEPARIN SOD (PORK) LOCK FLUSH 100 UNIT/ML IV SOLN
500.0000 [IU] | Freq: Once | INTRAVENOUS | Status: AC | PRN
  Administered 2014-12-14: 500 [IU]
  Filled 2014-12-14: qty 5

## 2014-12-14 MED ORDER — PEGFILGRASTIM INJECTION 6 MG/0.6ML ~~LOC~~
6.0000 mg | PREFILLED_SYRINGE | Freq: Once | SUBCUTANEOUS | Status: AC
  Administered 2014-12-14: 6 mg via SUBCUTANEOUS
  Filled 2014-12-14: qty 0.6

## 2014-12-14 MED ORDER — SODIUM CHLORIDE 0.9 % IJ SOLN
10.0000 mL | INTRAMUSCULAR | Status: DC | PRN
  Administered 2014-12-14: 10 mL
  Filled 2014-12-14: qty 10

## 2014-12-14 NOTE — Progress Notes (Signed)
Neulasta given by infusion nurse

## 2014-12-14 NOTE — Patient Instructions (Signed)
Pegfilgrastim injection What is this medicine? PEGFILGRASTIM (peg fil GRA stim) is a long-acting granulocyte colony-stimulating factor that stimulates the growth of neutrophils, a type of white blood cell important in the body's fight against infection. It is used to reduce the incidence of fever and infection in patients with certain types of cancer who are receiving chemotherapy that affects the bone marrow. This medicine may be used for other purposes; ask your health care provider or pharmacist if you have questions. COMMON BRAND NAME(S): Neulasta What should I tell my health care provider before I take this medicine? They need to know if you have any of these conditions: -latex allergy -ongoing radiation therapy -sickle cell disease -skin reactions to acrylic adhesives (On-Body Injector only) -an unusual or allergic reaction to pegfilgrastim, filgrastim, other medicines, foods, dyes, or preservatives -pregnant or trying to get pregnant -breast-feeding How should I use this medicine? This medicine is for injection under the skin. If you get this medicine at home, you will be taught how to prepare and give the pre-filled syringe or how to use the On-body Injector. Refer to the patient Instructions for Use for detailed instructions. Use exactly as directed. Take your medicine at regular intervals. Do not take your medicine more often than directed. It is important that you put your used needles and syringes in a special sharps container. Do not put them in a trash can. If you do not have a sharps container, call your pharmacist or healthcare provider to get one. Talk to your pediatrician regarding the use of this medicine in children. Special care may be needed. Overdosage: If you think you have taken too much of this medicine contact a poison control center or emergency room at once. NOTE: This medicine is only for you. Do not share this medicine with others. What if I miss a dose? It is  important not to miss your dose. Call your doctor or health care professional if you miss your dose. If you miss a dose due to an On-body Injector failure or leakage, a new dose should be administered as soon as possible using a single prefilled syringe for manual use. What may interact with this medicine? Interactions have not been studied. Give your health care provider a list of all the medicines, herbs, non-prescription drugs, or dietary supplements you use. Also tell them if you smoke, drink alcohol, or use illegal drugs. Some items may interact with your medicine. This list may not describe all possible interactions. Give your health care provider a list of all the medicines, herbs, non-prescription drugs, or dietary supplements you use. Also tell them if you smoke, drink alcohol, or use illegal drugs. Some items may interact with your medicine. What should I watch for while using this medicine? You may need blood work done while you are taking this medicine. If you are going to need a MRI, CT scan, or other procedure, tell your doctor that you are using this medicine (On-Body Injector only). What side effects may I notice from receiving this medicine? Side effects that you should report to your doctor or health care professional as soon as possible: -allergic reactions like skin rash, itching or hives, swelling of the face, lips, or tongue -dizziness -fever -pain, redness, or irritation at site where injected -pinpoint red spots on the skin -shortness of breath or breathing problems -stomach or side pain, or pain at the shoulder -swelling -tiredness -trouble passing urine Side effects that usually do not require medical attention (report to your doctor   or health care professional if they continue or are bothersome): -bone pain -muscle pain This list may not describe all possible side effects. Call your doctor for medical advice about side effects. You may report side effects to FDA at  1-800-FDA-1088. Where should I keep my medicine? Keep out of the reach of children. Store pre-filled syringes in a refrigerator between 2 and 8 degrees C (36 and 46 degrees F). Do not freeze. Keep in carton to protect from light. Throw away this medicine if it is left out of the refrigerator for more than 48 hours. Throw away any unused medicine after the expiration date. NOTE: This sheet is a summary. It may not cover all possible information. If you have questions about this medicine, talk to your doctor, pharmacist, or health care provider.  2015, Elsevier/Gold Standard. (2013-06-15 16:14:05)  

## 2014-12-26 ENCOUNTER — Ambulatory Visit (HOSPITAL_COMMUNITY)
Admission: RE | Admit: 2014-12-26 | Discharge: 2014-12-26 | Disposition: A | Discharge status: Home / Self Care | Payer: 59 | Source: Ambulatory Visit | Attending: Nurse Practitioner | Admitting: Nurse Practitioner

## 2014-12-26 DIAGNOSIS — C7800 Secondary malignant neoplasm of unspecified lung: Secondary | ICD-10-CM | POA: Diagnosis present

## 2014-12-26 DIAGNOSIS — C20 Malignant neoplasm of rectum: Secondary | ICD-10-CM | POA: Insufficient documentation

## 2014-12-26 MED ORDER — IOHEXOL 300 MG/ML  SOLN
75.0000 mL | Freq: Once | INTRAMUSCULAR | Status: AC | PRN
  Administered 2014-12-26: 75 mL via INTRAVENOUS

## 2014-12-28 ENCOUNTER — Other Ambulatory Visit: Payer: Self-pay | Admitting: Oncology

## 2014-12-30 ENCOUNTER — Other Ambulatory Visit: Payer: Self-pay | Admitting: Oncology

## 2015-01-02 ENCOUNTER — Other Ambulatory Visit: Payer: Self-pay | Admitting: *Deleted

## 2015-01-02 ENCOUNTER — Encounter: Payer: Self-pay | Admitting: *Deleted

## 2015-01-02 ENCOUNTER — Telehealth: Payer: Self-pay | Admitting: Oncology

## 2015-01-02 ENCOUNTER — Ambulatory Visit (HOSPITAL_BASED_OUTPATIENT_CLINIC_OR_DEPARTMENT_OTHER): Payer: 59 | Admitting: Oncology

## 2015-01-02 ENCOUNTER — Other Ambulatory Visit (HOSPITAL_BASED_OUTPATIENT_CLINIC_OR_DEPARTMENT_OTHER): Payer: 59

## 2015-01-02 ENCOUNTER — Telehealth: Payer: Self-pay | Admitting: *Deleted

## 2015-01-02 ENCOUNTER — Ambulatory Visit (HOSPITAL_BASED_OUTPATIENT_CLINIC_OR_DEPARTMENT_OTHER): Payer: 59

## 2015-01-02 VITALS — BP 138/69 | HR 73

## 2015-01-02 VITALS — BP 179/69 | HR 80 | Temp 98.2°F | Resp 20 | Ht 63.0 in | Wt 131.0 lb

## 2015-01-02 DIAGNOSIS — C20 Malignant neoplasm of rectum: Secondary | ICD-10-CM

## 2015-01-02 DIAGNOSIS — C7801 Secondary malignant neoplasm of right lung: Secondary | ICD-10-CM

## 2015-01-02 DIAGNOSIS — K1230 Oral mucositis (ulcerative), unspecified: Secondary | ICD-10-CM | POA: Diagnosis not present

## 2015-01-02 DIAGNOSIS — I10 Essential (primary) hypertension: Secondary | ICD-10-CM

## 2015-01-02 DIAGNOSIS — C7802 Secondary malignant neoplasm of left lung: Secondary | ICD-10-CM

## 2015-01-02 DIAGNOSIS — C7800 Secondary malignant neoplasm of unspecified lung: Secondary | ICD-10-CM

## 2015-01-02 DIAGNOSIS — E119 Type 2 diabetes mellitus without complications: Secondary | ICD-10-CM

## 2015-01-02 DIAGNOSIS — Z23 Encounter for immunization: Secondary | ICD-10-CM

## 2015-01-02 DIAGNOSIS — Z5111 Encounter for antineoplastic chemotherapy: Secondary | ICD-10-CM | POA: Diagnosis not present

## 2015-01-02 LAB — CBC WITH DIFFERENTIAL/PLATELET
BASO%: 1.2 % (ref 0.0–2.0)
BASOS ABS: 0.1 10*3/uL (ref 0.0–0.1)
EOS%: 2.4 % (ref 0.0–7.0)
Eosinophils Absolute: 0.2 10*3/uL (ref 0.0–0.5)
HCT: 38.6 % (ref 34.8–46.6)
HEMOGLOBIN: 12.7 g/dL (ref 11.6–15.9)
LYMPH%: 9.8 % — ABNORMAL LOW (ref 14.0–49.7)
MCH: 29.5 pg (ref 25.1–34.0)
MCHC: 32.9 g/dL (ref 31.5–36.0)
MCV: 89.4 fL (ref 79.5–101.0)
MONO#: 0.5 10*3/uL (ref 0.1–0.9)
MONO%: 6.2 % (ref 0.0–14.0)
NEUT#: 7.1 10*3/uL — ABNORMAL HIGH (ref 1.5–6.5)
NEUT%: 80.4 % — AB (ref 38.4–76.8)
Platelets: 223 10*3/uL (ref 145–400)
RBC: 4.32 10*6/uL (ref 3.70–5.45)
RDW: 17.7 % — AB (ref 11.2–14.5)
WBC: 8.8 10*3/uL (ref 3.9–10.3)
lymph#: 0.9 10*3/uL (ref 0.9–3.3)

## 2015-01-02 LAB — COMPREHENSIVE METABOLIC PANEL (CC13)
ALBUMIN: 4 g/dL (ref 3.5–5.0)
ALK PHOS: 60 U/L (ref 40–150)
ALT: 15 U/L (ref 0–55)
ANION GAP: 8 meq/L (ref 3–11)
AST: 14 U/L (ref 5–34)
BUN: 13.8 mg/dL (ref 7.0–26.0)
CALCIUM: 10.3 mg/dL (ref 8.4–10.4)
CHLORIDE: 104 meq/L (ref 98–109)
CO2: 28 mEq/L (ref 22–29)
CREATININE: 0.6 mg/dL (ref 0.6–1.1)
EGFR: 90 mL/min/{1.73_m2} — ABNORMAL LOW (ref >90)
Glucose: 142 mg/dl — ABNORMAL HIGH (ref 70–140)
Potassium: 4.2 mEq/L (ref 3.5–5.1)
Sodium: 141 mEq/L (ref 136–145)
Total Bilirubin: 0.38 mg/dL (ref 0.20–1.20)
Total Protein: 6.7 g/dL (ref 6.4–8.3)

## 2015-01-02 LAB — CEA: CEA: 9 ng/mL — AB (ref 0.0–5.0)

## 2015-01-02 LAB — UA PROTEIN, DIPSTICK - CHCC: PROTEIN: NEGATIVE mg/dL

## 2015-01-02 MED ORDER — INFLUENZA VAC SPLIT QUAD 0.5 ML IM SUSY
0.5000 mL | PREFILLED_SYRINGE | Freq: Once | INTRAMUSCULAR | Status: AC
  Administered 2015-01-02: 0.5 mL via INTRAMUSCULAR
  Filled 2015-01-02: qty 0.5

## 2015-01-02 MED ORDER — DEXTROSE 5 % IV SOLN
Freq: Once | INTRAVENOUS | Status: AC
  Administered 2015-01-02: 12:00:00 via INTRAVENOUS

## 2015-01-02 MED ORDER — SODIUM CHLORIDE 0.9 % IV SOLN
1680.0000 mg/m2 | INTRAVENOUS | Status: DC
  Administered 2015-01-02: 2700 mg via INTRAVENOUS
  Filled 2015-01-02: qty 54

## 2015-01-02 MED ORDER — OXYCODONE-ACETAMINOPHEN 10-325 MG PO TABS: 1.0000 | ORAL_TABLET | ORAL | Status: DC | PRN

## 2015-01-02 MED ORDER — SODIUM CHLORIDE 0.9 % IV SOLN
Freq: Once | INTRAVENOUS | Status: AC
  Administered 2015-01-02: 10:00:00 via INTRAVENOUS

## 2015-01-02 MED ORDER — FLUOROURACIL CHEMO INJECTION 500 MG/10ML
200.0000 mg/m2 | Freq: Once | INTRAVENOUS | Status: AC
  Administered 2015-01-02: 300 mg via INTRAVENOUS
  Filled 2015-01-02: qty 6

## 2015-01-02 MED ORDER — SODIUM CHLORIDE 0.9 % IV SOLN
Freq: Once | INTRAVENOUS | Status: AC
  Administered 2015-01-02: 10:00:00 via INTRAVENOUS
  Filled 2015-01-02: qty 4

## 2015-01-02 MED ORDER — LEUCOVORIN CALCIUM INJECTION 350 MG
200.0000 mg/m2 | Freq: Once | INTRAMUSCULAR | Status: AC
  Administered 2015-01-02: 320 mg via INTRAVENOUS
  Filled 2015-01-02: qty 16

## 2015-01-02 MED ORDER — SODIUM CHLORIDE 0.9 % IV SOLN
5.0000 mg/kg | Freq: Once | INTRAVENOUS | Status: AC
  Administered 2015-01-02: 275 mg via INTRAVENOUS
  Filled 2015-01-02: qty 11

## 2015-01-02 MED ORDER — OXALIPLATIN CHEMO INJECTION 100 MG/20ML
85.0000 mg/m2 | Freq: Once | INTRAVENOUS | Status: AC
  Administered 2015-01-02: 135 mg via INTRAVENOUS
  Filled 2015-01-02: qty 20

## 2015-01-02 NOTE — Telephone Encounter (Signed)
per pof to sch pt appt-sent MW email to sch trmt-pt to get updated sch b4 leaving

## 2015-01-02 NOTE — Progress Notes (Signed)
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 She feels well. No new complaint. She completed another cycle of FOLFIRI/Avastin on 12/12/2014.  Objective:  Vital signs in last 24 hours:  Blood pressure 179/69, pulse 80, temperature 98.2 F (36.8 C), temperature source Oral, resp. rate 20, height _0  (1.6 m), weight 131 lb (59.421 kg), SpO2 100 %.    HEENT: No thrush or ulcers Resp: Decreased breath sounds at the right lower chest, no respiratory distress Cardio: Regular rate and rhythm GI: No hepatomegaly, no mass, nontender Vascular: Trace edema at the left ankle  Portacath/PICC-without erythema  Lab Results:  Lab Results  Component Value Date   WBC 8.8 01/02/2015   HGB 12.7 01/02/2015   HCT 38.6 01/02/2015   MCV 89.4 01/02/2015   PLT 223 01/02/2015   NEUTROABS 7.1* 01/02/2015    Lab Results  Component Value Date   NA 141 01/02/2015    Lab Results  Component Value Date   CEA 2.0 06/01/2014    Imaging:  CT of the chest 12/26/2014, compared 09/14/2014-increase in the size of bilateral lung nodules.  I reviewed the CT images with Dr. [name] and her family   Medications: I have reviewed the patient's current medications.  Assessment/Plan: 1. Metastatic rectal cancer diagnosed July 2014.  Colonoscopy on 10/25/2012 showed a bulky circumferential friable mass lesion in the rectum at approximately 4 cm above the dentate line. The mass was subtotally obstructing and would not permit passage of the colonoscope proximal. Biopsy showed adenocarcinoma.   Staging CT scans 10/25/2012 showed a right lower lobe lung mass measuring 5 x 4.6 cm; 0.8 cm nodule right upper lobe; masslike rectosigmoid wall thickening, surrounding stranding and adjacent lymphadenopathy.   PET scan 11/02/2012 showed hypermetabolism in the area of apparent circumferential rectal wall thickening; the 5 cm  right lower lobe pulmonary mass was hypermetabolic with SUV max equal 8. No other hypermetabolic lung lesions were evident. Small bilateral pulmonary parenchymal nodules were noted.   She completed 5 cycles of FOLFOX chemotherapy 11/22/2012 through 02/13/2013.   Status post right thoracotomy, bilobectomy (right lower and middle lobectomy) with en bloc resection of the diaphragm 02/27/2013. Pathology showed adenocarcinoma consistent with metastatic colorectal adenocarcinoma, 4.3 cm, negative margins, no lymph node involvement. Negative soft tissue biopsy of the diaphragm x2. Positive for K-ras mutation-codon 13   She completed concurrent radiation and Xeloda for treatment of the rectal tumor 03/06/2013 through 04/11/2013.   Status post low anterior resection with a diverting loop ileostomy on 05/31/2013 at [facility] in [place]. Final pathology showed moderately differentiated adenocarcinoma located in the upper rectum; the tumor measured 2.5 x 2.2 cm; there was invasion through the muscularis propria into pericolic adipose tissue; tumor budding was present. Lymphatic status negative; vascular status negative; proximal and distal surgical margins negative; 2 of 17 lymph nodes showed metastatic adenocarcinoma. Mesenteric tumor deposits were present. Perineural invasion present. [facility] confirmed an additional positive [facility] node and the tumor was staged as a T4a   CT abdomen/pelvis 05/12/2013. Metastatic nodules in the lung increased. Chronic loculated right base pneumothorax. Diffuse mucosal thickening of the sigmoid portion of the colon.   Restaging CTs 07/25/2013 with enlargement of bilateral lung nodules, no other evidence of metastatic disease   Ileostomy takedown 08/14/2013   Chest CT 10/02/2013 revealed enlargement of bilateral lung nodules and increased right pleural fluid   Cycle 1 of FOLFIRI/Avastin 10/03/2013  Cycle 6 of FOLFIRI/Avastin 12/12/2013    Restaging CT 12/22/2013 with a decrease in the size of bilateral lung nodules, no evidence of disease progression   Cycle 7 FOLFIRI/Avastin 12/25/2013  Cycle 8 FOLFIRI/Avastin 01/10/2014  Cycle 9 FOLFIRI/Avastin 01/24/2014  Cycle 10 FOLFIRI/Avastin 02/07/2014  Cycle of 11 FOLFIRI/Avastin 02/21/2014  Restaging CT 03/05/2014 with a decrease in the size of 2 dominant right lung nodules, no new nodules, new pleural gas collection at the right apex  Cycle 12 FOLFIRI/Avastin 03/07/2014  Cycle 13 FOLFIRI/Avastin 03/20/2014  Cycle 14 FOLFIRI/Avastin 04/04/2014  Initiation of maintenance Xeloda/Avastin 04/18/2014  CT chest 07/09/2014 revealed slight enlargement of lung nodules  Treatment with single agent Avastin 07/10/2014  Resumption of FOLFIRI/Avastin on a 2 week schedule 07/25/2014  CT angio chest 09/14/2014 with essentially stable multifocal pulmonary metastatic disease.  5-fluorouracil dose reduced beginning 09/19/2014  FOLFIRI/Avastin switch to a 3 week schedule beginning with treatment 10/10/2014 secondary to malaise, diarrhea, and mucositis, irinotecan dose reduced  CT 12/26/2014 confirmed an increase in the size of bilateral lung nodules  FOLFOX/Avastin initiated on a 2 week schedule 01/02/2015 2. Status post right VATS, drainage of pleural effusion, visceral and parietal pleural decortication 12/21/2012. Pleural fluid culture positive for viridans Streptococcus. Right pleura biopsies negative for malignancy. Findings favored empyema. 3. Diabetes. 4. Frequent bowel movements, diminished pelvic muscle tone-we made a referral to the pelvic physical therapy clinic 5. Pain in the left leg with tenderness at the left calf and left ischium-MRI of the lumbar spine and pelvis on 10/13/2011 with no evidence of metastatic disease. A right iliac bone "lesion" was felt to most likely represent heterogenous marrow. Edema and enhancement was noted at the left sciatic nerve compared  to the right side with no mass lesion 6. Hypertension-on HCTZ 7. Left foot fracture 8. BRIP1 mutation 9. Mucositis most likely related to 5-fluorouracil. Dose reduction of 5 fluorouracil beginning 09/19/2014.    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 The restaging CT reveals enlargement of bilateral lung nodules. We reviewed the CT images and discussed treatment options. She she is not a candidate for EGFR inhibitor therapy secondary to the K-ras mutation. She received 5 cycles of FOLFOX and the neo-adjuvant setting. She has not developed disease progression on FOLFOX and is now almost 2 years out from the last treatment with oxaliplatin. I recommend proceeding with FOLFOX/Avastin. She agrees. We reviewed the potential toxicities associated with oxaliplatin including the chance for an allergic reaction and progressive neuropathy.  We will be sure the tumor has been tested for microsatellite instability and mismatch repair protein expression.  Dr. [name] will receive an influenza vaccine today. She will return for an office visit and chemotherapy in 2 weeks.  Betsy Coder, MD  01/02/2015  9:03 AM

## 2015-01-02 NOTE — Progress Notes (Unsigned)
Dr. Benay Spice requesting MSI testing and IHC testing on her pathology from [facility] dated 05/31/2013. Case [id number]. Slides from this case reviewed here on 07/26/2013. Request emailed to [facility] Pathology department.

## 2015-01-02 NOTE — Telephone Encounter (Signed)
Per staff message and POF I have scheduled appts. Advised scheduler of appts. [name]  

## 2015-01-02 NOTE — Patient Instructions (Signed)
[facility] Discharge Instructions for Patients Receiving Chemotherapy  Today you received the following chemotherapy agents: Avastin, Oxaliplatin, Leucovorin and Fluorouracil.  To help prevent nausea and vomiting after your treatment, we encourage you to take your nausea medication: Compazine. Take one every six hours as needed.   If you develop nausea and vomiting that is not controlled by your nausea medication, call the clinic.   BELOW ARE SYMPTOMS THAT SHOULD BE REPORTED IMMEDIATELY:  *FEVER GREATER THAN 100.5 F  *CHILLS WITH OR WITHOUT FEVER  NAUSEA AND VOMITING THAT IS NOT CONTROLLED WITH YOUR NAUSEA MEDICATION  *UNUSUAL SHORTNESS OF BREATH  *UNUSUAL BRUISING OR BLEEDING  TENDERNESS IN MOUTH AND THROAT WITH OR WITHOUT PRESENCE OF ULCERS  *URINARY PROBLEMS  *BOWEL PROBLEMS  UNUSUAL RASH Items with * indicate a potential emergency and should be followed up as soon as possible.  Feel free to call the clinic should you have any questions or concerns. The clinic phone number is [phone].  Please show the [place] at check-in to the Emergency Department and triage nurse.

## 2015-01-03 ENCOUNTER — Other Ambulatory Visit: Payer: Self-pay | Admitting: *Deleted

## 2015-01-04 ENCOUNTER — Ambulatory Visit: Payer: Medicare Other

## 2015-01-04 ENCOUNTER — Ambulatory Visit (HOSPITAL_BASED_OUTPATIENT_CLINIC_OR_DEPARTMENT_OTHER): Payer: 59

## 2015-01-04 DIAGNOSIS — C20 Malignant neoplasm of rectum: Secondary | ICD-10-CM

## 2015-01-04 DIAGNOSIS — C7802 Secondary malignant neoplasm of left lung: Secondary | ICD-10-CM | POA: Diagnosis not present

## 2015-01-04 DIAGNOSIS — C7801 Secondary malignant neoplasm of right lung: Secondary | ICD-10-CM | POA: Diagnosis not present

## 2015-01-04 DIAGNOSIS — Z452 Encounter for adjustment and management of vascular access device: Secondary | ICD-10-CM

## 2015-01-04 DIAGNOSIS — C7800 Secondary malignant neoplasm of unspecified lung: Principal | ICD-10-CM

## 2015-01-04 MED ORDER — HEPARIN SOD (PORK) LOCK FLUSH 100 UNIT/ML IV SOLN
250.0000 [IU] | Freq: Once | INTRAVENOUS | Status: DC | PRN
  Filled 2015-01-04: qty 5

## 2015-01-04 MED ORDER — ALTEPLASE 2 MG IJ SOLR
2.0000 mg | Freq: Once | INTRAMUSCULAR | Status: DC | PRN
  Filled 2015-01-04: qty 2

## 2015-01-04 MED ORDER — SODIUM CHLORIDE 0.9 % IJ SOLN
3.0000 mL | INTRAMUSCULAR | Status: DC | PRN
  Filled 2015-01-04: qty 10

## 2015-01-04 MED ORDER — COLD PACK MISC ONCOLOGY
1.0000 | Freq: Once | Status: DC | PRN
  Filled 2015-01-04: qty 1

## 2015-01-04 MED ORDER — SODIUM CHLORIDE 0.9 % IJ SOLN
10.0000 mL | INTRAMUSCULAR | Status: DC | PRN
  Administered 2015-01-04: 10 mL
  Filled 2015-01-04: qty 10

## 2015-01-04 MED ORDER — HEPARIN SOD (PORK) LOCK FLUSH 100 UNIT/ML IV SOLN
500.0000 [IU] | Freq: Once | INTRAVENOUS | Status: AC | PRN
  Administered 2015-01-04: 500 [IU]
  Filled 2015-01-04: qty 5

## 2015-01-13 ENCOUNTER — Other Ambulatory Visit: Payer: Self-pay | Admitting: Oncology

## 2015-01-16 ENCOUNTER — Ambulatory Visit (HOSPITAL_BASED_OUTPATIENT_CLINIC_OR_DEPARTMENT_OTHER): Payer: 59

## 2015-01-16 ENCOUNTER — Encounter: Payer: Self-pay | Admitting: *Deleted

## 2015-01-16 ENCOUNTER — Ambulatory Visit: Payer: 59

## 2015-01-16 ENCOUNTER — Other Ambulatory Visit (HOSPITAL_BASED_OUTPATIENT_CLINIC_OR_DEPARTMENT_OTHER): Payer: 59

## 2015-01-16 ENCOUNTER — Ambulatory Visit (HOSPITAL_BASED_OUTPATIENT_CLINIC_OR_DEPARTMENT_OTHER): Payer: 59 | Admitting: Oncology

## 2015-01-16 ENCOUNTER — Telehealth: Payer: Self-pay | Admitting: *Deleted

## 2015-01-16 ENCOUNTER — Telehealth: Payer: Self-pay | Admitting: Oncology

## 2015-01-16 VITALS — BP 146/69 | HR 74

## 2015-01-16 VITALS — BP 160/77 | HR 81 | Temp 98.3°F | Resp 16 | Ht 63.0 in | Wt 131.3 lb

## 2015-01-16 DIAGNOSIS — C7801 Secondary malignant neoplasm of right lung: Secondary | ICD-10-CM

## 2015-01-16 DIAGNOSIS — K1231 Oral mucositis (ulcerative) due to antineoplastic therapy: Secondary | ICD-10-CM

## 2015-01-16 DIAGNOSIS — Z5111 Encounter for antineoplastic chemotherapy: Secondary | ICD-10-CM

## 2015-01-16 DIAGNOSIS — C20 Malignant neoplasm of rectum: Secondary | ICD-10-CM | POA: Diagnosis not present

## 2015-01-16 DIAGNOSIS — Z5112 Encounter for antineoplastic immunotherapy: Secondary | ICD-10-CM

## 2015-01-16 DIAGNOSIS — C7802 Secondary malignant neoplasm of left lung: Secondary | ICD-10-CM

## 2015-01-16 DIAGNOSIS — C7800 Secondary malignant neoplasm of unspecified lung: Principal | ICD-10-CM

## 2015-01-16 DIAGNOSIS — E119 Type 2 diabetes mellitus without complications: Secondary | ICD-10-CM

## 2015-01-16 DIAGNOSIS — Z95828 Presence of other vascular implants and grafts: Secondary | ICD-10-CM

## 2015-01-16 DIAGNOSIS — I10 Essential (primary) hypertension: Secondary | ICD-10-CM

## 2015-01-16 LAB — CBC WITH DIFFERENTIAL/PLATELET
BASO%: 0.9 % (ref 0.0–2.0)
Basophils Absolute: 0.1 10*3/uL (ref 0.0–0.1)
EOS%: 5.4 % (ref 0.0–7.0)
Eosinophils Absolute: 0.3 10*3/uL (ref 0.0–0.5)
HCT: 36.2 % (ref 34.8–46.6)
HGB: 12 g/dL (ref 11.6–15.9)
LYMPH%: 12 % — AB (ref 14.0–49.7)
MCH: 29.1 pg (ref 25.1–34.0)
MCHC: 33.1 g/dL (ref 31.5–36.0)
MCV: 87.9 fL (ref 79.5–101.0)
MONO#: 0.5 10*3/uL (ref 0.1–0.9)
MONO%: 9.2 % (ref 0.0–14.0)
NEUT#: 4.3 10*3/uL (ref 1.5–6.5)
NEUT%: 72.5 % (ref 38.4–76.8)
PLATELETS: 207 10*3/uL (ref 145–400)
RBC: 4.13 10*6/uL (ref 3.70–5.45)
RDW: 18.2 % — ABNORMAL HIGH (ref 11.2–14.5)
WBC: 5.9 10*3/uL (ref 3.9–10.3)
lymph#: 0.7 10*3/uL — ABNORMAL LOW (ref 0.9–3.3)

## 2015-01-16 LAB — COMPREHENSIVE METABOLIC PANEL (CC13)
ALT: 21 U/L (ref 0–55)
ANION GAP: 8 meq/L (ref 3–11)
AST: 19 U/L (ref 5–34)
Albumin: 4 g/dL (ref 3.5–5.0)
Alkaline Phosphatase: 42 U/L (ref 40–150)
BUN: 13.6 mg/dL (ref 7.0–26.0)
CHLORIDE: 104 meq/L (ref 98–109)
CO2: 28 meq/L (ref 22–29)
Calcium: 10.2 mg/dL (ref 8.4–10.4)
Creatinine: 0.6 mg/dL (ref 0.6–1.1)
GLUCOSE: 136 mg/dL (ref 70–140)
Potassium: 4.1 mEq/L (ref 3.5–5.1)
SODIUM: 140 meq/L (ref 136–145)
Total Bilirubin: 0.38 mg/dL (ref 0.20–1.20)
Total Protein: 6.6 g/dL (ref 6.4–8.3)

## 2015-01-16 LAB — UA PROTEIN, DIPSTICK - CHCC: Protein, ur: NEGATIVE mg/dL

## 2015-01-16 MED ORDER — SODIUM CHLORIDE 0.9 % IV SOLN
1680.0000 mg/m2 | INTRAVENOUS | Status: DC
  Administered 2015-01-16: 2700 mg via INTRAVENOUS
  Filled 2015-01-16: qty 54

## 2015-01-16 MED ORDER — SODIUM CHLORIDE 0.9 % IJ SOLN
10.0000 mL | INTRAMUSCULAR | Status: DC | PRN
  Filled 2015-01-16: qty 10

## 2015-01-16 MED ORDER — METHYLPHENIDATE HCL ER (OSM) 27 MG PO TBCR: 27.0000 mg | EXTENDED_RELEASE_TABLET | Freq: Every day | ORAL | Status: DC

## 2015-01-16 MED ORDER — OXALIPLATIN CHEMO INJECTION 100 MG/20ML
85.0000 mg/m2 | Freq: Once | INTRAVENOUS | Status: AC
  Administered 2015-01-16: 135 mg via INTRAVENOUS
  Filled 2015-01-16: qty 10

## 2015-01-16 MED ORDER — DEXTROSE 5 % IV SOLN
Freq: Once | INTRAVENOUS | Status: AC
  Administered 2015-01-16: 11:00:00 via INTRAVENOUS

## 2015-01-16 MED ORDER — SODIUM CHLORIDE 0.9 % IV SOLN
Freq: Once | INTRAVENOUS | Status: AC
  Administered 2015-01-16: 10:00:00 via INTRAVENOUS
  Filled 2015-01-16: qty 4

## 2015-01-16 MED ORDER — FLUOROURACIL CHEMO INJECTION 500 MG/10ML
200.0000 mg/m2 | Freq: Once | INTRAVENOUS | Status: AC
  Administered 2015-01-16: 300 mg via INTRAVENOUS
  Filled 2015-01-16: qty 6

## 2015-01-16 MED ORDER — LEUCOVORIN CALCIUM INJECTION 350 MG
200.0000 mg/m2 | Freq: Once | INTRAVENOUS | Status: AC
  Administered 2015-01-16: 320 mg via INTRAVENOUS
  Filled 2015-01-16: qty 16

## 2015-01-16 MED ORDER — SODIUM CHLORIDE 0.9 % IV SOLN
5.0000 mg/kg | Freq: Once | INTRAVENOUS | Status: AC
  Administered 2015-01-16: 275 mg via INTRAVENOUS
  Filled 2015-01-16: qty 11

## 2015-01-16 MED ORDER — SODIUM CHLORIDE 0.9 % IV SOLN
Freq: Once | INTRAVENOUS | Status: AC
  Administered 2015-01-16: 10:00:00 via INTRAVENOUS

## 2015-01-16 NOTE — Telephone Encounter (Signed)
per pof to sch pt appt-gave pt copy of avs-sent MW emailt to sch trmt-pt to get updated sch b4 leaving trmt room

## 2015-01-16 NOTE — Telephone Encounter (Signed)
Per staff message and POF I have scheduled appts. Advised scheduler of appts. [name]  

## 2015-01-16 NOTE — Progress Notes (Signed)
Oncology Nurse Navigator Documentation  Oncology Nurse Navigator Flowsheets 01/16/2015  Navigator Encounter Type Treatment  Treatment Phase Treatment #2 new regimen  Barriers/Navigation Needs No barriers at this time  Interventions None required  Time Spent with Patient 10  Main complaint is fatigue. Eating well and spirits are good. Her sister is excellent support for her. She has enjoyed speaking w/nurses about child behavior and how to manage. Has been bringing in books to some staff. She wants to do some lectures for staff in future when she is finished with chemo.

## 2015-01-16 NOTE — Progress Notes (Addendum)
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 She competed a cycle of FOLFOX/Avastin 12/12/2014. No nausea/vomiting, mouth sores, or diarrhea. She reports increased malaise over the past 2 weeks. No symptom of thrombosis. No bleeding. No change in neuropathy symptoms at the feet.  Objective:  Vital signs in last 24 hours:  Blood pressure 160/77, pulse 81, temperature 98.3 F (36.8 C), temperature source Oral, resp. rate 16, height $RemoveBe'5\' 3"'SZCIuGmDo$  (1.6 m), weight 131 lb 4.8 oz (59.557 kg), SpO2 100 %.    HEENT: No thrush or ulcers Resp: Decreased breath sounds at the right lower chest Cardio: Regular rate and rhythm GI: No hepatomegaly, nontender Vascular: No leg edema    Portacath/PICC-without erythema  Lab Results:  Lab Results  Component Value Date   WBC 5.9 01/16/2015   HGB 12.0 01/16/2015   HCT 36.2 01/16/2015   MCV 87.9 01/16/2015   PLT 207 01/16/2015   NEUTROABS 4.3 01/16/2015      Lab Results  Component Value Date   CEA 9.0* 01/02/2015    Medications: I have reviewed the patient's current medications.  Assessment/Plan: 1. Metastatic rectal cancer diagnosed July 2014. MSI stable on the March 2015 resection specimen  Colonoscopy on 10/25/2012 showed a bulky circumferential friable mass lesion in the rectum at approximately 4 cm above the dentate line. The mass was subtotally obstructing and would not permit passage of the colonoscope proximal. Biopsy showed adenocarcinoma.   Staging CT scans 10/25/2012 showed a right lower lobe lung mass measuring 5 x 4.6 cm; 0.8 cm nodule right upper lobe; masslike rectosigmoid wall thickening, surrounding stranding and adjacent lymphadenopathy.   PET scan 11/02/2012 showed hypermetabolism in the area of apparent circumferential rectal wall thickening; the 5 cm right lower lobe pulmonary mass was hypermetabolic with SUV max equal 8. No other  hypermetabolic lung lesions were evident. Small bilateral pulmonary parenchymal nodules were noted.   She completed 5 cycles of FOLFOX chemotherapy 11/22/2012 through 02/13/2013.   Status post right thoracotomy, bilobectomy (right lower and middle lobectomy) with en bloc resection of the diaphragm 02/27/2013. Pathology showed adenocarcinoma consistent with metastatic colorectal adenocarcinoma, 4.3 cm, negative margins, no lymph node involvement. Negative soft tissue biopsy of the diaphragm x2. Positive for K-ras mutation-codon 13   She completed concurrent radiation and Xeloda for treatment of the rectal tumor 03/06/2013 through 04/11/2013.   Status post low anterior resection with a diverting loop ileostomy on 05/31/2013 at [facility] in [place]. Final pathology showed moderately differentiated adenocarcinoma located in the upper rectum; the tumor measured 2.5 x 2.2 cm; there was invasion through the muscularis propria into pericolic adipose tissue; tumor budding was present. Lymphatic status negative; vascular status negative; proximal and distal surgical margins negative; 2 of 17 lymph nodes showed metastatic adenocarcinoma. Mesenteric tumor deposits were present. Perineural invasion present. [facility] confirmed an additional positive lymph node and the tumor was staged as a T4a   CT abdomen/pelvis 05/12/2013. Metastatic nodules in the lung increased. Chronic loculated right base pneumothorax. Diffuse mucosal thickening of the sigmoid portion of the colon.   Restaging CTs 07/25/2013 with enlargement of bilateral lung nodules, no other evidence of metastatic disease   Ileostomy takedown 08/14/2013   Chest CT 10/02/2013 revealed enlargement of bilateral lung nodules and increased right pleural fluid   Cycle 1 of FOLFIRI/Avastin 10/03/2013   Cycle 6 of FOLFIRI/Avastin 12/12/2013   Restaging CT 12/22/2013 with a decrease in the  size of bilateral lung nodules, no  evidence of disease progression   Cycle 7 FOLFIRI/Avastin 12/25/2013  Cycle 8 FOLFIRI/Avastin 01/10/2014  Cycle 9 FOLFIRI/Avastin 01/24/2014  Cycle 10 FOLFIRI/Avastin 02/07/2014  Cycle of 11 FOLFIRI/Avastin 02/21/2014  Restaging CT 03/05/2014 with a decrease in the size of 2 dominant right lung nodules, no new nodules, new pleural gas collection at the right apex  Cycle 12 FOLFIRI/Avastin 03/07/2014  Cycle 13 FOLFIRI/Avastin 03/20/2014  Cycle 14 FOLFIRI/Avastin 04/04/2014  Initiation of maintenance Xeloda/Avastin 04/18/2014  CT chest 07/09/2014 revealed slight enlargement of lung nodules  Treatment with single agent Avastin 07/10/2014  Resumption of FOLFIRI/Avastin on a 2 week schedule 07/25/2014  CT angio chest 09/14/2014 with essentially stable multifocal pulmonary metastatic disease.  5-fluorouracil dose reduced beginning 09/19/2014  FOLFIRI/Avastin switch to a 3 week schedule beginning with treatment 10/10/2014 secondary to malaise, diarrhea, and mucositis, irinotecan dose reduced  CT 12/26/2014 confirmed an increase in the size of bilateral lung nodules  FOLFOX/Avastin initiated on a 2 week schedule 01/02/2015 2. Status post right VATS, drainage of pleural effusion, visceral and parietal pleural decortication 12/21/2012. Pleural fluid culture positive for viridans Streptococcus. Right pleura biopsies negative for malignancy. Findings favored empyema. 3. Diabetes. 4. Frequent bowel movements, diminished pelvic muscle tone-we made a referral to the pelvic physical therapy clinic 5. Pain in the left leg with tenderness at the left calf and left ischium-MRI of the lumbar spine and pelvis on 10/13/2011 with no evidence of metastatic disease. A right iliac bone "lesion" was felt to most likely represent heterogenous marrow. Edema and enhancement was noted at the left sciatic nerve compared to the right side with no mass lesion 6. Hypertension-on HCTZ 7. Left foot  fracture 8. BRIP1 mutation 9. Mucositis most likely related to 5-fluorouracil. Dose reduction of 5 fluorouracil beginning 09/19/2014.    Disposition:  She has completed one cycle of salvage therapy with FOLFOX/Avastin. She experienced increased malaise following this cycle of chemotherapy. We discussed switching to a 3 week interval. She would like to stay on the 2 week schedule for now. She will complete a second cycle of FOLFOX/Avastin today and return for an office visit in 2 weeks.  Betsy Coder, MD  01/16/2015  9:41 AM

## 2015-01-16 NOTE — Patient Instructions (Signed)
[facility] Discharge Instructions for Patients Receiving Chemotherapy  Today you received the following chemotherapy agents: Avastin, Oxaliplatin, Leucovorin and Fluorouracil.  To help prevent nausea and vomiting after your treatment, we encourage you to take your nausea medication: Compazine. Take one every six hours as needed.   If you develop nausea and vomiting that is not controlled by your nausea medication, call the clinic.   BELOW ARE SYMPTOMS THAT SHOULD BE REPORTED IMMEDIATELY:  *FEVER GREATER THAN 100.5 F  *CHILLS WITH OR WITHOUT FEVER  NAUSEA AND VOMITING THAT IS NOT CONTROLLED WITH YOUR NAUSEA MEDICATION  *UNUSUAL SHORTNESS OF BREATH  *UNUSUAL BRUISING OR BLEEDING  TENDERNESS IN MOUTH AND THROAT WITH OR WITHOUT PRESENCE OF ULCERS  *URINARY PROBLEMS  *BOWEL PROBLEMS  UNUSUAL RASH Items with * indicate a potential emergency and should be followed up as soon as possible.  Feel free to call the clinic should you have any questions or concerns. The clinic phone number is [phone].  Please show the [place] at check-in to the Emergency Department and triage nurse.

## 2015-01-18 ENCOUNTER — Ambulatory Visit (HOSPITAL_BASED_OUTPATIENT_CLINIC_OR_DEPARTMENT_OTHER): Payer: 59

## 2015-01-18 ENCOUNTER — Ambulatory Visit: Payer: 59

## 2015-01-18 VITALS — BP 149/86 | HR 81 | Temp 98.3°F | Resp 20

## 2015-01-18 DIAGNOSIS — C20 Malignant neoplasm of rectum: Secondary | ICD-10-CM

## 2015-01-18 DIAGNOSIS — C7801 Secondary malignant neoplasm of right lung: Secondary | ICD-10-CM

## 2015-01-18 DIAGNOSIS — C7802 Secondary malignant neoplasm of left lung: Secondary | ICD-10-CM

## 2015-01-18 MED ORDER — HEPARIN SOD (PORK) LOCK FLUSH 100 UNIT/ML IV SOLN
500.0000 [IU] | Freq: Once | INTRAVENOUS | Status: AC | PRN
  Administered 2015-01-18: 500 [IU]
  Filled 2015-01-18: qty 5

## 2015-01-18 MED ORDER — SODIUM CHLORIDE 0.9 % IJ SOLN
10.0000 mL | INTRAMUSCULAR | Status: DC | PRN
  Administered 2015-01-18: 10 mL
  Filled 2015-01-18: qty 10

## 2015-01-18 NOTE — Patient Instructions (Signed)

## 2015-01-18 NOTE — Progress Notes (Signed)
Pt completed in injection rm

## 2015-01-22 ENCOUNTER — Other Ambulatory Visit: Payer: Self-pay | Admitting: Internal Medicine

## 2015-01-22 NOTE — Telephone Encounter (Signed)
Sent in

## 2015-01-23 ENCOUNTER — Other Ambulatory Visit: Payer: Medicare Other

## 2015-01-23 ENCOUNTER — Ambulatory Visit: Payer: Medicare Other

## 2015-01-23 ENCOUNTER — Other Ambulatory Visit: Payer: Self-pay | Admitting: Oncology

## 2015-01-23 ENCOUNTER — Ambulatory Visit: Payer: Medicare Other | Admitting: Oncology

## 2015-01-25 ENCOUNTER — Ambulatory Visit: Payer: Medicare Other

## 2015-01-27 ENCOUNTER — Other Ambulatory Visit: Payer: Self-pay | Admitting: Oncology

## 2015-01-28 ENCOUNTER — Other Ambulatory Visit: Payer: Self-pay | Admitting: Thoracic Surgery (Cardiothoracic Vascular Surgery)

## 2015-01-28 DIAGNOSIS — C3490 Malignant neoplasm of unspecified part of unspecified bronchus or lung: Secondary | ICD-10-CM

## 2015-01-29 ENCOUNTER — Ambulatory Visit
Admission: RE | Admit: 2015-01-29 | Discharge: 2015-01-29 | Disposition: A | Discharge status: Home / Self Care | Payer: 59 | Source: Ambulatory Visit | Attending: Thoracic Surgery (Cardiothoracic Vascular Surgery) | Admitting: Thoracic Surgery (Cardiothoracic Vascular Surgery)

## 2015-01-29 ENCOUNTER — Encounter: Payer: Self-pay | Admitting: Thoracic Surgery (Cardiothoracic Vascular Surgery)

## 2015-01-29 ENCOUNTER — Ambulatory Visit (INDEPENDENT_AMBULATORY_CARE_PROVIDER_SITE_OTHER): Payer: 59 | Admitting: Thoracic Surgery (Cardiothoracic Vascular Surgery)

## 2015-01-29 VITALS — BP 148/86 | HR 86 | Resp 16 | Ht 63.0 in | Wt 128.0 lb

## 2015-01-29 DIAGNOSIS — C7800 Secondary malignant neoplasm of unspecified lung: Secondary | ICD-10-CM

## 2015-01-29 DIAGNOSIS — C20 Malignant neoplasm of rectum: Secondary | ICD-10-CM | POA: Diagnosis not present

## 2015-01-29 DIAGNOSIS — C3490 Malignant neoplasm of unspecified part of unspecified bronchus or lung: Secondary | ICD-10-CM

## 2015-01-29 DIAGNOSIS — Z902 Acquired absence of lung [part of]: Secondary | ICD-10-CM | POA: Diagnosis not present

## 2015-01-29 NOTE — Progress Notes (Signed)
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  She is a 69 year old woman with metastatic colon cancer. We did a bilobectomy for metastatic disease in December 2014. She's had multiple other lung metastases. She's currently on FOLFOX and Avastin that 2 week intervals. She had changed to every 3 weeks a while back to the toxicity, but had progression of disease so returned to 2 week cycles.  She continues to feel very poorly after treatments. It takes about a week for her to recover. She has been having a more frequent cough. Her dyspnea is stable.  Past Medical History  Diagnosis Date  . Postmenopausal HRT (hormone replacement therapy) 07/01/2011    surgical menopause  . Hx of hyperglycemia 07/01/2011  . Hx of compression fracture of spine 1985    t12 after a  20 foot fall  . Hx of fracture of wrist     after fall   . 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 Hx of colonic polyps   . Early cataract     stoneburner   . High frequency hearing loss     [facility] followed   . Complication of anesthesia     pt hadpanic attacks with versed, no versed  . Status post chemotherapy     completed a total of three cycles of Folfox   . Colon cancer ([place])     rectal cancer with lung metastasis  . Skin cancer     multiple  . Heart murmur, systolic 10/01/9161    innocent per Dr. Gershon Mussel   . Asthma     as a child none since 39 years old  . Headache(784.0)   . Family history of malignant neoplasm of breast   . Family history of malignant neoplasm of ovary   . 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 Fracture of left foot     2015 trip  . Diabetes mellitus without complication [facility])       Current Outpatient Prescriptions  Medication Sig  Dispense Refill  . ACCU-CHEK AVIVA PLUS test strip CHECK BLOOD GLUCOSE (SUGAR) TWICE DAILY 100 each 6  . amLODipine (NORVASC) 10 MG tablet TAKE ONE TABLET EACH DAY 90 tablet 1  . Blood Glucose Monitoring Suppl (ACCU-CHEK AVIVA PLUS) W/DEVICE KIT AS DIRECTED 1 kit 0  . estradiol (MINIVELLE) 0.0375 MG/24HR Place 1 patch onto the skin 2 (two) times a week. Sundays and Thursdays    . hydrochlorothiazide (MICROZIDE) 12.5 MG capsule TAKE 1 CAPSULE BY MOUTH ONCE DAILY 30 capsule 1  . JANUVIA 100 MG tablet TAKE 1 TABLET BY MOUTH ONCE DAILY 30 tablet 5  . metFORMIN (GLUCOPHAGE) 1000 MG tablet TAKE ONE TABLET TWICE DAILY WITH A MEAL 180 tablet 1  . methylphenidate 27 MG PO CR tablet Take 1 tablet (27 mg total) by mouth daily. 30 tablet 0  . methylTESTOSTERone (ANDROID) 10 MG capsule Take 10 mg by mouth.    . Multiple Vitamin (MULTIVITAMIN) tablet Take 1 tablet by mouth daily. Senior    . OVER THE COUNTER MEDICATION Place 1 drop into both ears  2 (two) times a week. Ear drops to decrease wax build up    . oxyCODONE-acetaminophen (PERCOCET) 10-325 MG tablet Take 1 tablet by mouth every 4 (four) hours as needed for pain. 60 tablet 0  . psyllium (METAMUCIL) 58.6 % powder Take 1 packet by mouth daily.     No current facility-administered medications for this visit.   Facility-Administered Medications Ordered in Other Visits  Medication Dose Route Frequency Provider Last Rate Last Dose  . sodium chloride 0.9 % injection 10 mL  10 mL Intracatheter PRN Ladell Pier, MD   10 mL at 08/24/14 1637    Physical Exam BP 148/86 mmHg  Pulse 86  Resp 16  Ht $R'5\' 3"'zv$  (1.6 m)  Wt 128 lb (58.06 kg)  BMI 22.68 kg/m2  SpO53 74% 69 year old woman in no acute distress Well-developed Alert and oriented 3 no focal deficits Cardiac regular rate and rhythm normal S1 and S2 Lungs absent breath sounds right base, otherwise clear  Diagnostic Tests: I personally reviewed her Chest x-ray, there has been no significant  change and compared to her film from July. There is possibly slight enlargement of one of the left lung nodules.   Impression: 69 year old woman with stage IV colon cancer with lung metastases. She had a bilobectomy back in 2014. There is no surgical option currently. She will continue with chemotherapy. She sees Dr. Benay Spice later this week.   Plan: Return in 6 months with PA and lateral chest x-ray  I spent 5 minutes with Dr. [name] during this visit, greater than 50% was in counseling  Melrose Nakayama, MD [facility] [phone]

## 2015-01-30 ENCOUNTER — Ambulatory Visit (HOSPITAL_BASED_OUTPATIENT_CLINIC_OR_DEPARTMENT_OTHER): Payer: 59

## 2015-01-30 ENCOUNTER — Telehealth: Payer: Self-pay | Admitting: Oncology

## 2015-01-30 ENCOUNTER — Ambulatory Visit (HOSPITAL_BASED_OUTPATIENT_CLINIC_OR_DEPARTMENT_OTHER): Payer: 59 | Admitting: Oncology

## 2015-01-30 ENCOUNTER — Telehealth: Payer: Self-pay | Admitting: *Deleted

## 2015-01-30 ENCOUNTER — Other Ambulatory Visit (HOSPITAL_BASED_OUTPATIENT_CLINIC_OR_DEPARTMENT_OTHER): Payer: 59

## 2015-01-30 VITALS — BP 153/76 | HR 83 | Temp 98.6°F | Resp 18 | Ht 63.0 in | Wt 129.5 lb

## 2015-01-30 VITALS — BP 112/59 | HR 74

## 2015-01-30 DIAGNOSIS — Z5112 Encounter for antineoplastic immunotherapy: Secondary | ICD-10-CM

## 2015-01-30 DIAGNOSIS — C7802 Secondary malignant neoplasm of left lung: Secondary | ICD-10-CM

## 2015-01-30 DIAGNOSIS — Z5111 Encounter for antineoplastic chemotherapy: Secondary | ICD-10-CM | POA: Diagnosis not present

## 2015-01-30 DIAGNOSIS — K219 Gastro-esophageal reflux disease without esophagitis: Secondary | ICD-10-CM | POA: Diagnosis not present

## 2015-01-30 DIAGNOSIS — C20 Malignant neoplasm of rectum: Secondary | ICD-10-CM

## 2015-01-30 DIAGNOSIS — I10 Essential (primary) hypertension: Secondary | ICD-10-CM

## 2015-01-30 DIAGNOSIS — C7801 Secondary malignant neoplasm of right lung: Secondary | ICD-10-CM | POA: Diagnosis not present

## 2015-01-30 DIAGNOSIS — C7800 Secondary malignant neoplasm of unspecified lung: Principal | ICD-10-CM

## 2015-01-30 LAB — COMPREHENSIVE METABOLIC PANEL (CC13)
ALBUMIN: 4.2 g/dL (ref 3.5–5.0)
ALK PHOS: 39 U/L — AB (ref 40–150)
ALT: 23 U/L (ref 0–55)
AST: 21 U/L (ref 5–34)
Anion Gap: 8 mEq/L (ref 3–11)
BILIRUBIN TOTAL: 0.48 mg/dL (ref 0.20–1.20)
BUN: 10.8 mg/dL (ref 7.0–26.0)
CALCIUM: 10.2 mg/dL (ref 8.4–10.4)
CO2: 27 mEq/L (ref 22–29)
CREATININE: 0.7 mg/dL (ref 0.6–1.1)
Chloride: 104 mEq/L (ref 98–109)
EGFR: 89 mL/min/{1.73_m2} — ABNORMAL LOW (ref >90)
GLUCOSE: 131 mg/dL (ref 70–140)
Potassium: 3.5 mEq/L (ref 3.5–5.1)
SODIUM: 140 meq/L (ref 136–145)
TOTAL PROTEIN: 6.7 g/dL (ref 6.4–8.3)

## 2015-01-30 LAB — CBC WITH DIFFERENTIAL/PLATELET
BASO%: 1.1 % (ref 0.0–2.0)
Basophils Absolute: 0.1 10*3/uL (ref 0.0–0.1)
EOS%: 5.1 % (ref 0.0–7.0)
Eosinophils Absolute: 0.2 10*3/uL (ref 0.0–0.5)
HEMATOCRIT: 36.8 % (ref 34.8–46.6)
HEMOGLOBIN: 12 g/dL (ref 11.6–15.9)
LYMPH#: 0.8 10*3/uL — AB (ref 0.9–3.3)
LYMPH%: 17.8 % (ref 14.0–49.7)
MCH: 28.6 pg (ref 25.1–34.0)
MCHC: 32.6 g/dL (ref 31.5–36.0)
MCV: 87.8 fL (ref 79.5–101.0)
MONO#: 0.6 10*3/uL (ref 0.1–0.9)
MONO%: 12.1 % (ref 0.0–14.0)
NEUT%: 63.9 % (ref 38.4–76.8)
NEUTROS ABS: 3 10*3/uL (ref 1.5–6.5)
Platelets: 139 10*3/uL — ABNORMAL LOW (ref 145–400)
RBC: 4.19 10*6/uL (ref 3.70–5.45)
RDW: 16.8 % — AB (ref 11.2–14.5)
WBC: 4.7 10*3/uL (ref 3.9–10.3)

## 2015-01-30 LAB — UA PROTEIN, DIPSTICK - CHCC

## 2015-01-30 MED ORDER — SODIUM CHLORIDE 0.9 % IV SOLN
Freq: Once | INTRAVENOUS | Status: AC
  Administered 2015-01-30: 10:00:00 via INTRAVENOUS
  Filled 2015-01-30: qty 4

## 2015-01-30 MED ORDER — LEUCOVORIN CALCIUM INJECTION 350 MG
200.0000 mg/m2 | Freq: Once | INTRAVENOUS | Status: AC
  Administered 2015-01-30: 320 mg via INTRAVENOUS
  Filled 2015-01-30: qty 16

## 2015-01-30 MED ORDER — DEXTROSE 5 % IV SOLN
Freq: Once | INTRAVENOUS | Status: AC
  Administered 2015-01-30: 11:00:00 via INTRAVENOUS

## 2015-01-30 MED ORDER — FLUOROURACIL CHEMO INJECTION 500 MG/10ML
200.0000 mg/m2 | Freq: Once | INTRAVENOUS | Status: AC
  Administered 2015-01-30: 300 mg via INTRAVENOUS
  Filled 2015-01-30: qty 6

## 2015-01-30 MED ORDER — SODIUM CHLORIDE 0.9 % IV SOLN
1680.0000 mg/m2 | INTRAVENOUS | Status: DC
  Administered 2015-01-30: 2700 mg via INTRAVENOUS
  Filled 2015-01-30: qty 54

## 2015-01-30 MED ORDER — SODIUM CHLORIDE 0.9 % IV SOLN
Freq: Once | INTRAVENOUS | Status: AC
  Administered 2015-01-30: 10:00:00 via INTRAVENOUS

## 2015-01-30 MED ORDER — OXALIPLATIN CHEMO INJECTION 100 MG/20ML
85.0000 mg/m2 | Freq: Once | INTRAVENOUS | Status: AC
  Administered 2015-01-30: 135 mg via INTRAVENOUS
  Filled 2015-01-30: qty 20

## 2015-01-30 MED ORDER — SODIUM CHLORIDE 0.9 % IV SOLN
5.0000 mg/kg | Freq: Once | INTRAVENOUS | Status: AC
  Administered 2015-01-30: 275 mg via INTRAVENOUS
  Filled 2015-01-30: qty 11

## 2015-01-30 NOTE — Progress Notes (Signed)
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 She completed another cycle of FOLFOX 01/16/2015. 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 She develops malaise following chemotherapy. She has noted increased reflux symptoms, relieved with "Tums ". She saw Dr. Roxan Hockey yesterday. No nausea/vomiting or diarrhea.  She gets out of the house daily.  Objective:  Vital signs in last 24 hours:  Blood pressure 153/76, pulse 83, temperature 98.6 F (37 C), temperature source Oral, resp. rate 18, height $RemoveBe'5\' 3"'ENBBeTeas$  (1.6 m), weight 129 lb 8 oz (58.741 kg), SpO2 99 %.    HEENT: No thrush or ulcers Resp: Decreased breath sounds at the right lower chest, no respiratory distress Cardio: Regular rate and rhythm GI: No hepatomegaly, nontender Vascular: Trace edema at the left foot Neuro: The vibratory sense is intact at the fingertip bilaterally     Portacath/PICC-without erythema  Lab Results:  Lab Results  Component Value Date   WBC 4.7 01/30/2015   HGB 12.0 01/30/2015   HCT 36.8 01/30/2015   MCV 87.8 01/30/2015   PLT 139* 01/30/2015   NEUTROABS 3.0 01/30/2015      Lab Results  Component Value Date   CEA 9.0* 01/02/2015    Imaging:  Dg Chest 2 View  01/29/2015  CLINICAL DATA:  Colon cancer. EXAM: CHEST  2 VIEW COMPARISON:  CT 12/26/2014.  Chest x-ray 10/23/2014 . FINDINGS: Prior port noted in stable position. The mediastinum hilar structures are unremarkable. Heart size is stable. Pulmonary nodules are again present. Postsurgical changes right lung again noted. No pneumothorax. No acute osseous abnormality. Stable lower thoracic vertebral body compression fracture . IMPRESSION: 1. Prior poor catheter stable position. Stable postsurgical changes right lung. 2. Persistent pulmonary nodules consistent with metastatic disease. No interim change. Electronically Signed   By: Marcello Moores  Register   On:  01/29/2015 09:16    Medications: I have reviewed the patient's current medications.  Assessment/Plan: 1. Metastatic rectal cancer diagnosed July 2014. MSI stable on the March 2015 resection specimen  Colonoscopy on 10/25/2012 showed a bulky circumferential friable mass lesion in the rectum at approximately 4 cm above the dentate line. The mass was subtotally obstructing and would not permit passage of the colonoscope proximal. Biopsy showed adenocarcinoma.   Staging CT scans 10/25/2012 showed a right lower lobe lung mass measuring 5 x 4.6 cm; 0.8 cm nodule right upper lobe; masslike rectosigmoid wall thickening, surrounding stranding and adjacent lymphadenopathy.   PET scan 11/02/2012 showed hypermetabolism in the area of apparent circumferential rectal wall thickening; the 5 cm right lower lobe pulmonary mass was hypermetabolic with SUV max equal 8. No other hypermetabolic lung lesions were evident. Small bilateral pulmonary parenchymal nodules were noted.   She completed 5 cycles of FOLFOX chemotherapy 11/22/2012 through 02/13/2013.   Status post right thoracotomy, bilobectomy (right lower and middle lobectomy) with en bloc resection of the diaphragm 02/27/2013. Pathology showed adenocarcinoma consistent with metastatic colorectal adenocarcinoma, 4.3 cm, negative margins, no lymph node involvement. Negative soft tissue biopsy of the diaphragm x2. Positive for K-ras mutation-codon 13   She completed concurrent radiation and Xeloda for treatment of the rectal tumor 03/06/2013 through 04/11/2013.   Status post low anterior resection with a diverting loop ileostomy on 05/31/2013 at [facility] in [place]. Final pathology showed moderately differentiated adenocarcinoma located in the upper rectum; the tumor measured 2.5 x 2.2 cm; there was invasion through the muscularis propria into pericolic adipose tissue; tumor  budding was present. Lymphatic status negative; vascular status negative;  proximal and distal surgical margins negative; 2 of 17 lymph nodes showed metastatic adenocarcinoma. Mesenteric tumor deposits were present. Perineural invasion present. [facility] an additional positive [facility] node and the tumor was staged as a T4a   CT abdomen/pelvis 05/12/2013. Metastatic nodules in the lung increased. Chronic loculated right base pneumothorax. Diffuse mucosal thickening of the sigmoid portion of the colon.   Restaging CTs 07/25/2013 with enlargement of bilateral lung nodules, no other evidence of metastatic disease   Ileostomy takedown 08/14/2013   Chest CT 10/02/2013 revealed enlargement of bilateral lung nodules and increased right pleural fluid   Cycle 1 of FOLFIRI/Avastin 10/03/2013   Cycle 6 of FOLFIRI/Avastin 12/12/2013   Restaging CT 12/22/2013 with a decrease in the size of bilateral lung nodules, no evidence of disease progression   Cycle 7 FOLFIRI/Avastin 12/25/2013  Cycle 8 FOLFIRI/Avastin 01/10/2014  Cycle 9 FOLFIRI/Avastin 01/24/2014  Cycle 10 FOLFIRI/Avastin 02/07/2014  Cycle of 11 FOLFIRI/Avastin 02/21/2014  Restaging CT 03/05/2014 with a decrease in the size of 2 dominant right lung nodules, no new nodules, new pleural gas collection at the right apex  Cycle 12 FOLFIRI/Avastin 03/07/2014  Cycle 13 FOLFIRI/Avastin 03/20/2014  Cycle 14 FOLFIRI/Avastin 04/04/2014  Initiation of maintenance Xeloda/Avastin 04/18/2014  CT chest 07/09/2014 revealed slight enlargement of lung nodules  Treatment with single agent Avastin 07/10/2014  Resumption of FOLFIRI/Avastin on a 2 week schedule 07/25/2014  CT angio chest 09/14/2014 with essentially stable multifocal pulmonary metastatic disease.  5-fluorouracil dose reduced beginning 09/19/2014  FOLFIRI/Avastin switch to a 3 week schedule beginning with treatment 10/10/2014 secondary to malaise, diarrhea, and mucositis, irinotecan dose reduced  CT 12/26/2014 confirmed an  increase in the size of bilateral lung nodules  FOLFOX/Avastin initiated on a 2 week schedule 01/02/2015 2. Status post right VATS, drainage of pleural effusion, visceral and parietal pleural decortication 12/21/2012. Pleural fluid culture positive for viridans Streptococcus. Right pleura biopsies negative for malignancy. Findings favored empyema. 3. Diabetes. 4. Frequent bowel movements, diminished pelvic muscle tone-we made a referral to the pelvic physical therapy clinic 5. Pain in the left leg with tenderness at the left calf and left ischium-MRI of the lumbar spine and pelvis on 10/13/2011 with no evidence of metastatic disease. A right iliac bone "lesion" was felt to most likely represent heterogenous marrow. Edema and enhancement was noted at the left sciatic nerve compared to the right side with no mass lesion 6. Hypertension-on HCTZ 7. Left foot fracture 8. BRIP1 mutation 9. Mucositis most likely related to 5-fluorouracil. Dose reduction of 5 fluorouracil beginning 09/19/2014.    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 She is tolerated chemotherapy well. The plan is to proceed with cycle 3 today. She will return for an office visit and chemotherapy in 2 weeks. She will let us know if the reflux symptoms are not relieved with "Tums ". The plan is to obtain a restaging CT of the chest after approximate 5 cycles of FOLFOX. We will check a CEA when she returns in 2 weeks.  Anne Coder, MD  01/30/2015  2:46 PM

## 2015-01-30 NOTE — Patient Instructions (Signed)
[place] Discharge Instructions for Patients Receiving Chemotherapy  Today you received the following chemotherapy agents avastin, oxaliplatin, leucovorin, fluorouracil  To help prevent nausea and vomiting after your treatment, we encourage you to take your nausea medication    If you develop nausea and vomiting that is not controlled by your nausea medication, call the clinic.   BELOW ARE SYMPTOMS THAT SHOULD BE REPORTED IMMEDIATELY:  *FEVER GREATER THAN 100.5 F  *CHILLS WITH OR WITHOUT FEVER  NAUSEA AND VOMITING THAT IS NOT CONTROLLED WITH YOUR NAUSEA MEDICATION  *UNUSUAL SHORTNESS OF BREATH  *UNUSUAL BRUISING OR BLEEDING  TENDERNESS IN MOUTH AND THROAT WITH OR WITHOUT PRESENCE OF ULCERS  *URINARY PROBLEMS  *BOWEL PROBLEMS  UNUSUAL RASH Items with * indicate a potential emergency and should be followed up as soon as possible.  Feel free to call the clinic you have any questions or concerns. The clinic phone number is [phone].  Please show the [place] at check-in to the Emergency Department and triage nurse.

## 2015-01-30 NOTE — Progress Notes (Signed)
Pt to receive pneumonia vaccine on Friday with pump d/c.

## 2015-01-30 NOTE — Telephone Encounter (Signed)
per pof to sch tp appt-sent MW emailt os ch pt trmt & pump d/c-pt to get updated copy b4 leaving

## 2015-01-30 NOTE — Telephone Encounter (Signed)
Per staff message and POF I have scheduled appts. Advised scheduler of appts. [name]  

## 2015-02-01 ENCOUNTER — Other Ambulatory Visit: Payer: Self-pay | Admitting: Oncology

## 2015-02-01 ENCOUNTER — Ambulatory Visit (HOSPITAL_BASED_OUTPATIENT_CLINIC_OR_DEPARTMENT_OTHER): Payer: 59

## 2015-02-01 ENCOUNTER — Ambulatory Visit: Payer: 59

## 2015-02-01 VITALS — BP 144/73 | HR 78 | Temp 98.3°F | Resp 17

## 2015-02-01 DIAGNOSIS — C7802 Secondary malignant neoplasm of left lung: Secondary | ICD-10-CM | POA: Diagnosis not present

## 2015-02-01 DIAGNOSIS — C20 Malignant neoplasm of rectum: Secondary | ICD-10-CM | POA: Diagnosis not present

## 2015-02-01 DIAGNOSIS — C7801 Secondary malignant neoplasm of right lung: Secondary | ICD-10-CM | POA: Diagnosis not present

## 2015-02-01 DIAGNOSIS — C7800 Secondary malignant neoplasm of unspecified lung: Principal | ICD-10-CM

## 2015-02-01 MED ORDER — SODIUM CHLORIDE 0.9 % IJ SOLN
10.0000 mL | INTRAMUSCULAR | Status: DC | PRN
  Administered 2015-02-01: 10 mL
  Filled 2015-02-01: qty 10

## 2015-02-01 MED ORDER — HEPARIN SOD (PORK) LOCK FLUSH 100 UNIT/ML IV SOLN
500.0000 [IU] | Freq: Once | INTRAVENOUS | Status: AC | PRN
  Administered 2015-02-01: 500 [IU]
  Filled 2015-02-01: qty 5

## 2015-02-01 NOTE — Progress Notes (Signed)
No injections needed today per Dr. Benay Spice

## 2015-02-01 NOTE — Patient Instructions (Signed)

## 2015-02-05 ENCOUNTER — Other Ambulatory Visit: Payer: Self-pay | Admitting: Oncology

## 2015-02-06 ENCOUNTER — Other Ambulatory Visit: Payer: Self-pay | Admitting: Internal Medicine

## 2015-02-08 NOTE — Telephone Encounter (Signed)
Sent in

## 2015-02-10 ENCOUNTER — Other Ambulatory Visit: Payer: Self-pay | Admitting: Oncology

## 2015-02-13 ENCOUNTER — Telehealth: Payer: Self-pay | Admitting: Oncology

## 2015-02-13 ENCOUNTER — Other Ambulatory Visit (HOSPITAL_BASED_OUTPATIENT_CLINIC_OR_DEPARTMENT_OTHER): Payer: 59

## 2015-02-13 ENCOUNTER — Ambulatory Visit (HOSPITAL_BASED_OUTPATIENT_CLINIC_OR_DEPARTMENT_OTHER): Payer: 59 | Admitting: Oncology

## 2015-02-13 ENCOUNTER — Ambulatory Visit (HOSPITAL_BASED_OUTPATIENT_CLINIC_OR_DEPARTMENT_OTHER): Payer: 59

## 2015-02-13 VITALS — BP 126/68

## 2015-02-13 DIAGNOSIS — C7800 Secondary malignant neoplasm of unspecified lung: Principal | ICD-10-CM

## 2015-02-13 DIAGNOSIS — C20 Malignant neoplasm of rectum: Secondary | ICD-10-CM

## 2015-02-13 DIAGNOSIS — R05 Cough: Secondary | ICD-10-CM

## 2015-02-13 DIAGNOSIS — C7801 Secondary malignant neoplasm of right lung: Secondary | ICD-10-CM

## 2015-02-13 DIAGNOSIS — C7802 Secondary malignant neoplasm of left lung: Secondary | ICD-10-CM

## 2015-02-13 DIAGNOSIS — Z5112 Encounter for antineoplastic immunotherapy: Secondary | ICD-10-CM | POA: Diagnosis not present

## 2015-02-13 DIAGNOSIS — K1230 Oral mucositis (ulcerative), unspecified: Secondary | ICD-10-CM

## 2015-02-13 DIAGNOSIS — Z5111 Encounter for antineoplastic chemotherapy: Secondary | ICD-10-CM

## 2015-02-13 DIAGNOSIS — E119 Type 2 diabetes mellitus without complications: Secondary | ICD-10-CM

## 2015-02-13 DIAGNOSIS — I10 Essential (primary) hypertension: Secondary | ICD-10-CM

## 2015-02-13 LAB — COMPREHENSIVE METABOLIC PANEL (CC13)
ALT: 51 U/L (ref 0–55)
AST: 34 U/L (ref 5–34)
Albumin: 4.2 g/dL (ref 3.5–5.0)
Alkaline Phosphatase: 43 U/L (ref 40–150)
Anion Gap: 9 mEq/L (ref 3–11)
BUN: 9 mg/dL (ref 7.0–26.0)
CHLORIDE: 102 meq/L (ref 98–109)
CO2: 31 mEq/L — ABNORMAL HIGH (ref 22–29)
CREATININE: 0.7 mg/dL (ref 0.6–1.1)
Calcium: 10.2 mg/dL (ref 8.4–10.4)
EGFR: 88 mL/min/{1.73_m2} — ABNORMAL LOW (ref >90)
GLUCOSE: 129 mg/dL (ref 70–140)
POTASSIUM: 3.7 meq/L (ref 3.5–5.1)
SODIUM: 141 meq/L (ref 136–145)
Total Bilirubin: 0.53 mg/dL (ref 0.20–1.20)
Total Protein: 6.7 g/dL (ref 6.4–8.3)

## 2015-02-13 LAB — CBC WITH DIFFERENTIAL/PLATELET
BASO%: 1 % (ref 0.0–2.0)
BASOS ABS: 0 10*3/uL (ref 0.0–0.1)
EOS%: 3.8 % (ref 0.0–7.0)
Eosinophils Absolute: 0.2 10*3/uL (ref 0.0–0.5)
HCT: 37.7 % (ref 34.8–46.6)
HGB: 12.4 g/dL (ref 11.6–15.9)
LYMPH%: 15.6 % (ref 14.0–49.7)
MCH: 28.6 pg (ref 25.1–34.0)
MCHC: 32.8 g/dL (ref 31.5–36.0)
MCV: 87.1 fL (ref 79.5–101.0)
MONO#: 0.6 10*3/uL (ref 0.1–0.9)
MONO%: 11.4 % (ref 0.0–14.0)
NEUT#: 3.4 10*3/uL (ref 1.5–6.5)
NEUT%: 68.2 % (ref 38.4–76.8)
Platelets: 151 10*3/uL (ref 145–400)
RBC: 4.33 10*6/uL (ref 3.70–5.45)
RDW: 18.1 % — ABNORMAL HIGH (ref 11.2–14.5)
WBC: 5 10*3/uL (ref 3.9–10.3)
lymph#: 0.8 10*3/uL — ABNORMAL LOW (ref 0.9–3.3)

## 2015-02-13 LAB — CEA: CEA: 10.2 ng/mL — ABNORMAL HIGH (ref 0.0–5.0)

## 2015-02-13 LAB — UA PROTEIN, DIPSTICK - CHCC: Protein, ur: <30 mg/dL

## 2015-02-13 MED ORDER — LEUCOVORIN CALCIUM INJECTION 350 MG
200.0000 mg/m2 | Freq: Once | INTRAVENOUS | Status: AC
  Administered 2015-02-13: 320 mg via INTRAVENOUS
  Filled 2015-02-13: qty 16

## 2015-02-13 MED ORDER — SODIUM CHLORIDE 0.9 % IV SOLN
Freq: Once | INTRAVENOUS | Status: AC
  Administered 2015-02-13: 10:00:00 via INTRAVENOUS
  Filled 2015-02-13: qty 4

## 2015-02-13 MED ORDER — OXALIPLATIN CHEMO INJECTION 100 MG/20ML
85.0000 mg/m2 | Freq: Once | INTRAVENOUS | Status: AC
  Administered 2015-02-13: 135 mg via INTRAVENOUS
  Filled 2015-02-13: qty 27

## 2015-02-13 MED ORDER — SODIUM CHLORIDE 0.9 % IJ SOLN
10.0000 mL | INTRAMUSCULAR | Status: DC | PRN
  Filled 2015-02-13: qty 10

## 2015-02-13 MED ORDER — OXYCODONE-ACETAMINOPHEN 10-325 MG PO TABS: 1.0000 | ORAL_TABLET | ORAL | Status: DC | PRN

## 2015-02-13 MED ORDER — SODIUM CHLORIDE 0.9 % IV SOLN
1680.0000 mg/m2 | INTRAVENOUS | Status: DC
  Administered 2015-02-13: 2700 mg via INTRAVENOUS
  Filled 2015-02-13: qty 54

## 2015-02-13 MED ORDER — FLUOROURACIL CHEMO INJECTION 500 MG/10ML
200.0000 mg/m2 | Freq: Once | INTRAVENOUS | Status: AC
  Administered 2015-02-13: 300 mg via INTRAVENOUS
  Filled 2015-02-13: qty 6

## 2015-02-13 MED ORDER — SODIUM CHLORIDE 0.9 % IV SOLN
5.0000 mg/kg | Freq: Once | INTRAVENOUS | Status: AC
  Administered 2015-02-13: 275 mg via INTRAVENOUS
  Filled 2015-02-13: qty 11

## 2015-02-13 MED ORDER — DEXTROSE 5 % IV SOLN
Freq: Once | INTRAVENOUS | Status: AC
  Administered 2015-02-13: 11:00:00 via INTRAVENOUS

## 2015-02-13 MED ORDER — SODIUM CHLORIDE 0.9 % IV SOLN
Freq: Once | INTRAVENOUS | Status: AC
  Administered 2015-02-13: 10:00:00 via INTRAVENOUS

## 2015-02-13 NOTE — Patient Instructions (Addendum)
[facility] Discharge Instructions for Patients Receiving Chemotherapy  Today you received the following chemotherapy agents Avastin, Oxaliplatin, Leucovorin and Fluorouracil.  To help prevent nausea and vomiting after your treatment, we encourage you to take your nausea medication as directed.  If you develop nausea and vomiting that is not controlled by your nausea medication, call the clinic.   BELOW ARE SYMPTOMS THAT SHOULD BE REPORTED IMMEDIATELY:  *FEVER GREATER THAN 100.5 F  *CHILLS WITH OR WITHOUT FEVER  NAUSEA AND VOMITING THAT IS NOT CONTROLLED WITH YOUR NAUSEA MEDICATION  *UNUSUAL SHORTNESS OF BREATH  *UNUSUAL BRUISING OR BLEEDING  TENDERNESS IN MOUTH AND THROAT WITH OR WITHOUT PRESENCE OF ULCERS  *URINARY PROBLEMS  *BOWEL PROBLEMS  UNUSUAL RASH Items with * indicate a potential emergency and should be followed up as soon as possible.  Feel free to call the clinic you have any questions or concerns. The clinic phone number is [phone].  Please show the [facility] [facility] at check-in to the Emergency Department and triage nurse.

## 2015-02-13 NOTE — Progress Notes (Signed)
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 She completed another cycle of FOLFOX/Avastin on 01/30/2015. No nausea/vomiting or diarrhea. She has noted an increased cough. The cough is occasionally productive. No hemoptysis. No dyspnea. She has noted erythema at the feet and mild numbness at the distal feet and toes. This has not changed since resuming oxaliplatin.   Objective:  Vital signs in last 24 hours:  There were no vitals taken for this visit.    HEENT: No thrush or ulcers Resp: Lungs clear with decreased breath sounds at the right lower chest, no respiratory distress Cardio: Regular rate and rhythm GI: No hepatomegaly, nontender Vascular: Trace edema at the left ankle Neuro: Mild decrease in vibratory sense at the fingertips bilaterally  Skin: Faint erythema and hyperpigmentation at the lower legs and feet   Portacath/PICC-without erythema  Lab Results:  Lab Results  Component Value Date   WBC 5.0 02/13/2015   HGB 12.4 02/13/2015   HCT 37.7 02/13/2015   MCV 87.1 02/13/2015   PLT 151 02/13/2015   NEUTROABS 3.4 02/13/2015      Lab Results  Component Value Date   CEA 9.0* 01/02/2015    Medications: I have reviewed the patient's current medications.  Assessment/Plan: 1. Metastatic rectal cancer diagnosed July 2014. MSI stable on the March 2015 resection specimen  Colonoscopy on 10/25/2012 showed a bulky circumferential friable mass lesion in the rectum at approximately 4 cm above the dentate line. The mass was subtotally obstructing and would not permit passage of the colonoscope proximal. Biopsy showed adenocarcinoma.   Staging CT scans 10/25/2012 showed a right lower lobe lung mass measuring 5 x 4.6 cm; 0.8 cm nodule right upper lobe; masslike rectosigmoid wall thickening, surrounding stranding and adjacent lymphadenopathy.   PET scan 11/02/2012 showed hypermetabolism  in the area of apparent circumferential rectal wall thickening; the 5 cm right lower lobe pulmonary mass was hypermetabolic with SUV max equal 8. No other hypermetabolic lung lesions were evident. Small bilateral pulmonary parenchymal nodules were noted.   She completed 5 cycles of FOLFOX chemotherapy 11/22/2012 through 02/13/2013.   Status post right thoracotomy, bilobectomy (right lower and middle lobectomy) with en bloc resection of the diaphragm 02/27/2013. Pathology showed adenocarcinoma consistent with metastatic colorectal adenocarcinoma, 4.3 cm, negative margins, no lymph node involvement. Negative soft tissue biopsy of the diaphragm x2. Positive for K-ras mutation-codon 13   She completed concurrent radiation and Xeloda for treatment of the rectal tumor 03/06/2013 through 04/11/2013.   Status post low anterior resection with a diverting loop ileostomy on 05/31/2013 at [facility] in [place]. Final pathology showed moderately differentiated adenocarcinoma located in the upper rectum; the tumor measured 2.5 x 2.2 cm; there was invasion through the muscularis propria into pericolic adipose tissue; tumor budding was present. Lymphatic status negative; vascular status negative; proximal and distal surgical margins negative; 2 of 17 lymph nodes showed metastatic adenocarcinoma. Mesenteric tumor deposits were present. Perineural invasion present. [facility] confirmed an additional positive lymph node and the tumor was staged as a T4a   CT abdomen/pelvis 05/12/2013. Metastatic nodules in the lung increased. Chronic loculated right base pneumothorax. Diffuse mucosal thickening of the sigmoid portion of the colon.   Restaging CTs 07/25/2013 with enlargement of bilateral lung nodules, no other evidence of metastatic disease   Ileostomy takedown 08/14/2013   Chest CT 10/02/2013 revealed enlargement of bilateral lung nodules and increased right pleural fluid  Cycle 1 of  FOLFIRI/Avastin 10/03/2013   Cycle 6 of FOLFIRI/Avastin 12/12/2013   Restaging CT 12/22/2013 with a decrease in the size of bilateral lung nodules, no evidence of disease progression   Cycle 7 FOLFIRI/Avastin 12/25/2013  Cycle 8 FOLFIRI/Avastin 01/10/2014  Cycle 9 FOLFIRI/Avastin 01/24/2014  Cycle 10 FOLFIRI/Avastin 02/07/2014  Cycle of 11 FOLFIRI/Avastin 02/21/2014  Restaging CT 03/05/2014 with a decrease in the size of 2 dominant right lung nodules, no new nodules, new pleural gas collection at the right apex  Cycle 12 FOLFIRI/Avastin 03/07/2014  Cycle 13 FOLFIRI/Avastin 03/20/2014  Cycle 14 FOLFIRI/Avastin 04/04/2014  Initiation of maintenance Xeloda/Avastin 04/18/2014  CT chest 07/09/2014 revealed slight enlargement of lung nodules  Treatment with single agent Avastin 07/10/2014  Resumption of FOLFIRI/Avastin on a 2 week schedule 07/25/2014  CT angio chest 09/14/2014 with essentially stable multifocal pulmonary metastatic disease.  5-fluorouracil dose reduced beginning 09/19/2014  FOLFIRI/Avastin switch to a 3 week schedule beginning with treatment 10/10/2014 secondary to malaise, diarrhea, and mucositis, irinotecan dose reduced  CT 12/26/2014 confirmed an increase in the size of bilateral lung nodules  FOLFOX/Avastin initiated on a 2 week schedule 01/02/2015 2. Status post right VATS, drainage of pleural effusion, visceral and parietal pleural decortication 12/21/2012. Pleural fluid culture positive for viridans Streptococcus. Right pleura biopsies negative for malignancy. Findings favored empyema. 3. Diabetes. 4. Frequent bowel movements, diminished pelvic muscle tone-we made a referral to the pelvic physical therapy clinic 5. Pain in the left leg with tenderness at the left calf and left ischium-MRI of the lumbar spine and pelvis on 10/13/2011 with no evidence of metastatic disease. A right iliac bone "lesion" was felt to most likely represent heterogenous  marrow. Edema and enhancement was noted at the left sciatic nerve compared to the right side with no mass lesion 6. Hypertension-on HCTZ 7. Left foot fracture 8. BRIP1 mutation 9. Mucositis most likely related to 5-fluorouracil. Dose reduction of 5 fluorouracil beginning 09/19/2014.   Disposition:  Her overall status appears unchanged. The plan is to complete a fourth cycle of salvage therapy with FOLFOX/Avastin today. She will return for an office visit and chemotherapy on 02/25/2015. She will contact us for worsening of the cough. The cough is likely related to the chest tumor burden/surgery.  Betsy Coder, MD  02/13/2015  9:32 AM

## 2015-02-13 NOTE — Telephone Encounter (Signed)
Gave and printd appt sched and avs for pt for NOV and DEC

## 2015-02-15 ENCOUNTER — Ambulatory Visit (HOSPITAL_BASED_OUTPATIENT_CLINIC_OR_DEPARTMENT_OTHER): Payer: 59

## 2015-02-15 VITALS — BP 138/68 | HR 85 | Temp 98.4°F | Resp 18

## 2015-02-15 DIAGNOSIS — C7801 Secondary malignant neoplasm of right lung: Secondary | ICD-10-CM

## 2015-02-15 DIAGNOSIS — C7802 Secondary malignant neoplasm of left lung: Secondary | ICD-10-CM

## 2015-02-15 DIAGNOSIS — C20 Malignant neoplasm of rectum: Secondary | ICD-10-CM

## 2015-02-15 DIAGNOSIS — C7800 Secondary malignant neoplasm of unspecified lung: Principal | ICD-10-CM

## 2015-02-15 MED ORDER — SODIUM CHLORIDE 0.9 % IJ SOLN
10.0000 mL | INTRAMUSCULAR | Status: DC | PRN
  Administered 2015-02-15: 10 mL
  Filled 2015-02-15: qty 10

## 2015-02-15 MED ORDER — HEPARIN SOD (PORK) LOCK FLUSH 100 UNIT/ML IV SOLN
500.0000 [IU] | Freq: Once | INTRAVENOUS | Status: AC | PRN
  Administered 2015-02-15: 500 [IU]
  Filled 2015-02-15: qty 5

## 2015-02-15 NOTE — Patient Instructions (Signed)

## 2015-02-24 ENCOUNTER — Other Ambulatory Visit: Payer: Self-pay | Admitting: Oncology

## 2015-02-25 ENCOUNTER — Ambulatory Visit (HOSPITAL_BASED_OUTPATIENT_CLINIC_OR_DEPARTMENT_OTHER): Payer: Self-pay

## 2015-02-25 ENCOUNTER — Ambulatory Visit (HOSPITAL_BASED_OUTPATIENT_CLINIC_OR_DEPARTMENT_OTHER): Payer: Self-pay | Admitting: Oncology

## 2015-02-25 ENCOUNTER — Other Ambulatory Visit (HOSPITAL_BASED_OUTPATIENT_CLINIC_OR_DEPARTMENT_OTHER): Payer: Self-pay

## 2015-02-25 VITALS — BP 156/72 | HR 74 | Temp 97.9°F | Resp 18 | Ht 63.0 in | Wt 126.8 lb

## 2015-02-25 DIAGNOSIS — C7801 Secondary malignant neoplasm of right lung: Secondary | ICD-10-CM

## 2015-02-25 DIAGNOSIS — C20 Malignant neoplasm of rectum: Secondary | ICD-10-CM

## 2015-02-25 DIAGNOSIS — Z5112 Encounter for antineoplastic immunotherapy: Secondary | ICD-10-CM

## 2015-02-25 DIAGNOSIS — E119 Type 2 diabetes mellitus without complications: Secondary | ICD-10-CM

## 2015-02-25 DIAGNOSIS — C7802 Secondary malignant neoplasm of left lung: Secondary | ICD-10-CM

## 2015-02-25 DIAGNOSIS — C7800 Secondary malignant neoplasm of unspecified lung: Principal | ICD-10-CM

## 2015-02-25 DIAGNOSIS — Z5111 Encounter for antineoplastic chemotherapy: Secondary | ICD-10-CM

## 2015-02-25 DIAGNOSIS — K1230 Oral mucositis (ulcerative), unspecified: Secondary | ICD-10-CM

## 2015-02-25 DIAGNOSIS — I10 Essential (primary) hypertension: Secondary | ICD-10-CM

## 2015-02-25 LAB — COMPREHENSIVE METABOLIC PANEL (CC13)
ALBUMIN: 4.4 g/dL (ref 3.5–5.0)
ALK PHOS: 40 U/L (ref 40–150)
ALT: 33 U/L (ref 0–55)
ANION GAP: 12 meq/L — AB (ref 3–11)
AST: 28 U/L (ref 5–34)
BILIRUBIN TOTAL: 0.53 mg/dL (ref 0.20–1.20)
BUN: 7.9 mg/dL (ref 7.0–26.0)
CO2: 28 mEq/L (ref 22–29)
CREATININE: 0.8 mg/dL (ref 0.6–1.1)
Calcium: 10.8 mg/dL — ABNORMAL HIGH (ref 8.4–10.4)
Chloride: 100 mEq/L (ref 98–109)
EGFR: 80 mL/min/{1.73_m2} — AB (ref >90)
GLUCOSE: 138 mg/dL (ref 70–140)
Potassium: 3.8 mEq/L (ref 3.5–5.1)
SODIUM: 140 meq/L (ref 136–145)
TOTAL PROTEIN: 7.3 g/dL (ref 6.4–8.3)

## 2015-02-25 LAB — CBC WITH DIFFERENTIAL/PLATELET
BASO%: 1 % (ref 0.0–2.0)
Basophils Absolute: 0 10*3/uL (ref 0.0–0.1)
EOS%: 3.2 % (ref 0.0–7.0)
Eosinophils Absolute: 0.1 10*3/uL (ref 0.0–0.5)
HCT: 37.4 % (ref 34.8–46.6)
HGB: 12.3 g/dL (ref 11.6–15.9)
LYMPH%: 25.6 % (ref 14.0–49.7)
MCH: 28.8 pg (ref 25.1–34.0)
MCHC: 32.9 g/dL (ref 31.5–36.0)
MCV: 87.3 fL (ref 79.5–101.0)
MONO#: 0.6 10*3/uL (ref 0.1–0.9)
MONO%: 16.4 % — AB (ref 0.0–14.0)
NEUT%: 53.8 % (ref 38.4–76.8)
NEUTROS ABS: 2 10*3/uL (ref 1.5–6.5)
PLATELETS: 179 10*3/uL (ref 145–400)
RBC: 4.28 10*6/uL (ref 3.70–5.45)
RDW: 17.4 % — ABNORMAL HIGH (ref 11.2–14.5)
WBC: 3.8 10*3/uL — AB (ref 3.9–10.3)
lymph#: 1 10*3/uL (ref 0.9–3.3)

## 2015-02-25 LAB — UA PROTEIN, DIPSTICK - CHCC: Protein, ur: 30 mg/dL

## 2015-02-25 MED ORDER — SODIUM CHLORIDE 0.9 % IV SOLN
1680.0000 mg/m2 | INTRAVENOUS | Status: DC
  Administered 2015-02-25: 2700 mg via INTRAVENOUS
  Filled 2015-02-25: qty 54

## 2015-02-25 MED ORDER — DEXTROSE 5 % IV SOLN
Freq: Once | INTRAVENOUS | Status: AC
  Administered 2015-02-25: 10:00:00 via INTRAVENOUS

## 2015-02-25 MED ORDER — SODIUM CHLORIDE 0.9 % IV SOLN
5.0000 mg/kg | Freq: Once | INTRAVENOUS | Status: AC
  Administered 2015-02-25: 275 mg via INTRAVENOUS
  Filled 2015-02-25: qty 11

## 2015-02-25 MED ORDER — OXALIPLATIN CHEMO INJECTION 100 MG/20ML
85.0000 mg/m2 | Freq: Once | INTRAVENOUS | Status: AC
  Administered 2015-02-25: 135 mg via INTRAVENOUS
  Filled 2015-02-25: qty 27

## 2015-02-25 MED ORDER — SODIUM CHLORIDE 0.9 % IV SOLN
Freq: Once | INTRAVENOUS | Status: AC
  Administered 2015-02-25: 10:00:00 via INTRAVENOUS
  Filled 2015-02-25: qty 4

## 2015-02-25 MED ORDER — FLUOROURACIL CHEMO INJECTION 500 MG/10ML
200.0000 mg/m2 | Freq: Once | INTRAVENOUS | Status: AC
  Administered 2015-02-25: 300 mg via INTRAVENOUS
  Filled 2015-02-25: qty 6

## 2015-02-25 MED ORDER — SODIUM CHLORIDE 0.9 % IV SOLN
Freq: Once | INTRAVENOUS | Status: AC
  Administered 2015-02-25: 10:00:00 via INTRAVENOUS

## 2015-02-25 MED ORDER — LEUCOVORIN CALCIUM INJECTION 350 MG
200.0000 mg/m2 | Freq: Once | INTRAVENOUS | Status: AC
  Administered 2015-02-25: 320 mg via INTRAVENOUS
  Filled 2015-02-25: qty 16

## 2015-02-25 NOTE — Patient Instructions (Signed)
[facility] Discharge Instructions for Patients Receiving Chemotherapy  Today you received the following chemotherapy agents Avastin, Oxaliplatin, Leucovorin and Fluorouracil.  To help prevent nausea and vomiting after your treatment, we encourage you to take your nausea medication as directed.  If you develop nausea and vomiting that is not controlled by your nausea medication, call the clinic.   BELOW ARE SYMPTOMS THAT SHOULD BE REPORTED IMMEDIATELY:  *FEVER GREATER THAN 100.5 F  *CHILLS WITH OR WITHOUT FEVER  NAUSEA AND VOMITING THAT IS NOT CONTROLLED WITH YOUR NAUSEA MEDICATION  *UNUSUAL SHORTNESS OF BREATH  *UNUSUAL BRUISING OR BLEEDING  TENDERNESS IN MOUTH AND THROAT WITH OR WITHOUT PRESENCE OF ULCERS  *URINARY PROBLEMS  *BOWEL PROBLEMS  UNUSUAL RASH Items with * indicate a potential emergency and should be followed up as soon as possible.  Feel free to call the clinic you have any questions or concerns. The clinic phone number is [phone].  Please show the [place] at check-in to the Emergency Department and triage nurse.

## 2015-02-25 NOTE — Progress Notes (Signed)
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 She completed another cycle of FOLFOX/Avastin 02/13/2015. She reports malaise lasting approximately 4 days following chemotherapy. No nausea/vomiting, mouth sores, diarrhea, or bleeding. She has noted intermittent discomfort in the left breast for the past 2 weeks. No chest wall pain or pleuritic pain.  Objective:  Vital signs in last 24 hours:  Blood pressure 156/72, pulse 74, temperature 97.9 F (36.6 C), temperature source Oral, resp. rate 18, height 5' 3" (1.6 m), weight 126 lb 12.8 oz (57.516 kg), SpO2 99 %.    HEENT: No thrush or ulcers Resp: Lungs clear bilaterally with decreased breath sounds at the right lower chest, no respiratory distress Cardio: Regular rate and rhythm GI: Soft and nontender, no hepatomegaly Vascular: Trace edema at the left lower leg and ankle-no erythema Breast: Left breast without mass or tenderness     Portacath/PICC-without erythema  Lab Results:  Lab Results  Component Value Date   WBC 3.8* 02/25/2015   HGB 12.3 02/25/2015   HCT 37.4 02/25/2015   MCV 87.3 02/25/2015   PLT 179 02/25/2015   NEUTROABS 2.0 02/25/2015      Lab Results  Component Value Date   CEA 10.2* 02/13/2015    Medications: I have reviewed the patient's current medications.  Assessment/Plan: 1. Metastatic rectal cancer diagnosed July 2014. MSI stable on the March 2015 resection specimen  Colonoscopy on 10/25/2012 showed a bulky circumferential friable mass lesion in the rectum at approximately 4 cm above the dentate line. The mass was subtotally obstructing and would not permit passage of the colonoscope proximal. Biopsy showed adenocarcinoma.   Staging CT scans 10/25/2012 showed a right lower lobe lung mass measuring 5 x 4.6 cm; 0.8 cm nodule right upper lobe; masslike rectosigmoid wall thickening, surrounding stranding and adjacent  lymphadenopathy.   PET scan 11/02/2012 showed hypermetabolism in the area of apparent circumferential rectal wall thickening; the 5 cm right lower lobe pulmonary mass was hypermetabolic with SUV max equal 8. No other hypermetabolic lung lesions were evident. Small bilateral pulmonary parenchymal nodules were noted.   She completed 5 cycles of FOLFOX chemotherapy 11/22/2012 through 02/13/2013.   Status post right thoracotomy, bilobectomy (right lower and middle lobectomy) with en bloc resection of the diaphragm 02/27/2013. Pathology showed adenocarcinoma consistent with metastatic colorectal adenocarcinoma, 4.3 cm, negative margins, no lymph node involvement. Negative soft tissue biopsy of the diaphragm x2. Positive for K-ras mutation-codon 13   She completed concurrent radiation and Xeloda for treatment of the rectal tumor 03/06/2013 through 04/11/2013.   Status post low anterior resection with a diverting loop ileostomy on 05/31/2013 at [facility] in [place]. Final pathology showed moderately differentiated adenocarcinoma located in the upper rectum; the tumor measured 2.5 x 2.2 cm; there was invasion through the muscularis propria into pericolic adipose tissue; tumor budding was present. Lymphatic status negative; vascular status negative; proximal and distal surgical margins negative; 2 of 17 lymph nodes showed metastatic adenocarcinoma. Mesenteric tumor deposits were present. Perineural invasion present. [facility] confirmed an additional positive lymph node and the tumor was staged as a T4a   CT abdomen/pelvis 05/12/2013. Metastatic nodules in the lung increased. Chronic loculated right base pneumothorax. Diffuse mucosal thickening of the sigmoid portion of the colon.   Restaging CTs 07/25/2013 with enlargement of bilateral lung nodules, no other evidence of metastatic disease   Ileostomy takedown 08/14/2013   Chest CT 10/02/2013 revealed enlargement of  bilateral lung  nodules and increased right pleural fluid   Cycle 1 of FOLFIRI/Avastin 10/03/2013   Cycle 6 of FOLFIRI/Avastin 12/12/2013   Restaging CT 12/22/2013 with a decrease in the size of bilateral lung nodules, no evidence of disease progression   Cycle 7 FOLFIRI/Avastin 12/25/2013  Cycle 8 FOLFIRI/Avastin 01/10/2014  Cycle 9 FOLFIRI/Avastin 01/24/2014  Cycle 10 FOLFIRI/Avastin 02/07/2014  Cycle of 11 FOLFIRI/Avastin 02/21/2014  Restaging CT 03/05/2014 with a decrease in the size of 2 dominant right lung nodules, no new nodules, new pleural gas collection at the right apex  Cycle 12 FOLFIRI/Avastin 03/07/2014  Cycle 13 FOLFIRI/Avastin 03/20/2014  Cycle 14 FOLFIRI/Avastin 04/04/2014  Initiation of maintenance Xeloda/Avastin 04/18/2014  CT chest 07/09/2014 revealed slight enlargement of lung nodules  Treatment with single agent Avastin 07/10/2014  Resumption of FOLFIRI/Avastin on a 2 week schedule 07/25/2014  CT angio chest 09/14/2014 with essentially stable multifocal pulmonary metastatic disease.  5-fluorouracil dose reduced beginning 09/19/2014  FOLFIRI/Avastin switch to a 3 week schedule beginning with treatment 10/10/2014 secondary to malaise, diarrhea, and mucositis, irinotecan dose reduced  CT 12/26/2014 confirmed an increase in the size of bilateral lung nodules  FOLFOX/Avastin initiated on a 2 week schedule 01/02/2015 2. Status post right VATS, drainage of pleural effusion, visceral and parietal pleural decortication 12/21/2012. Pleural fluid culture positive for viridans Streptococcus. Right pleura biopsies negative for malignancy. Findings favored empyema. 3. Diabetes. 4. Frequent bowel movements, diminished pelvic muscle tone-we made a referral to the pelvic physical therapy clinic 5. Pain in the left leg with tenderness at the left calf and left ischium-MRI of the lumbar spine and pelvis on 10/13/2011 with no evidence of metastatic disease. A right iliac bone  "lesion" was felt to most likely represent heterogenous marrow. Edema and enhancement was noted at the left sciatic nerve compared to the right side with no mass lesion 6. Hypertension-on HCTZ 7. Left foot fracture 8. BRIP1 mutation 9. Mucositis most likely related to 5-fluorouracil. Dose reduction of 5 fluorouracil beginning 09/19/2014.     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 The plan is to proceed with another cycle of FOLFOX/Avastin today. She plans a trip to [place] later this week. She will be scheduled for a restaging CT on 03/20/2015 and an office visit 03/21/2015. Dr. [name] will contact us in the interim as needed.  Betsy Coder, MD  02/25/2015  9:10 AM

## 2015-02-26 ENCOUNTER — Other Ambulatory Visit: Payer: Self-pay | Admitting: Internal Medicine

## 2015-02-26 NOTE — Telephone Encounter (Signed)
Ok x 5  Please request if  Hg a1c can be done at her next hematology blood test  Dx dm controlled

## 2015-02-27 ENCOUNTER — Ambulatory Visit (HOSPITAL_BASED_OUTPATIENT_CLINIC_OR_DEPARTMENT_OTHER): Payer: 59

## 2015-02-27 ENCOUNTER — Telehealth: Payer: Self-pay | Admitting: Oncology

## 2015-02-27 ENCOUNTER — Telehealth: Payer: Self-pay | Admitting: *Deleted

## 2015-02-27 VITALS — BP 128/74 | HR 88 | Temp 97.8°F | Resp 18

## 2015-02-27 DIAGNOSIS — C7800 Secondary malignant neoplasm of unspecified lung: Principal | ICD-10-CM

## 2015-02-27 DIAGNOSIS — C7802 Secondary malignant neoplasm of left lung: Secondary | ICD-10-CM

## 2015-02-27 DIAGNOSIS — C20 Malignant neoplasm of rectum: Secondary | ICD-10-CM

## 2015-02-27 DIAGNOSIS — C7801 Secondary malignant neoplasm of right lung: Secondary | ICD-10-CM

## 2015-02-27 MED ORDER — SODIUM CHLORIDE 0.9 % IJ SOLN
10.0000 mL | INTRAMUSCULAR | Status: DC | PRN
  Administered 2015-02-27: 10 mL
  Filled 2015-02-27: qty 10

## 2015-02-27 MED ORDER — HEPARIN SOD (PORK) LOCK FLUSH 100 UNIT/ML IV SOLN
500.0000 [IU] | Freq: Once | INTRAVENOUS | Status: AC | PRN
  Administered 2015-02-27: 500 [IU]
  Filled 2015-02-27: qty 5

## 2015-02-27 NOTE — Telephone Encounter (Signed)
-----   Message from Ladell Pier, MD sent at 02/25/2015  6:44 PM EST ----- Please call patient, ca++ mildly elevated, may be due to hctz, is she taking calcium, check bmet day of pump d/c

## 2015-02-27 NOTE — Patient Instructions (Signed)

## 2015-02-27 NOTE — Telephone Encounter (Signed)
Spoke with patient re lab for 12/1 @ 8:30 am. Other appointments remain the same.

## 2015-02-27 NOTE — Telephone Encounter (Signed)
Per Dr. Benay Spice; notified pt that calcium is mildly elevated and MD wants to re-check before she goes on her trip out of the country.  Pt states she is taking tums at times.  Pt verbalized understanding that schedulers will call with time 03/01/15.

## 2015-02-28 ENCOUNTER — Other Ambulatory Visit (HOSPITAL_BASED_OUTPATIENT_CLINIC_OR_DEPARTMENT_OTHER): Payer: Medicare Other

## 2015-02-28 ENCOUNTER — Telehealth: Payer: Self-pay | Admitting: *Deleted

## 2015-02-28 DIAGNOSIS — C20 Malignant neoplasm of rectum: Secondary | ICD-10-CM | POA: Diagnosis present

## 2015-02-28 DIAGNOSIS — C7800 Secondary malignant neoplasm of unspecified lung: Principal | ICD-10-CM

## 2015-02-28 LAB — BASIC METABOLIC PANEL (CC13)
Anion Gap: 10 mEq/L (ref 3–11)
BUN: 10.1 mg/dL (ref 7.0–26.0)
CO2: 26 meq/L (ref 22–29)
Calcium: 10.2 mg/dL (ref 8.4–10.4)
Chloride: 103 mEq/L (ref 98–109)
Creatinine: 0.7 mg/dL (ref 0.6–1.1)
EGFR: 86 mL/min/{1.73_m2} — AB (ref >90)
GLUCOSE: 141 mg/dL — AB (ref 70–140)
POTASSIUM: 3.7 meq/L (ref 3.5–5.1)
SODIUM: 139 meq/L (ref 136–145)

## 2015-02-28 LAB — CEA: CEA: 10.7 ng/mL — ABNORMAL HIGH (ref 0.0–5.0)

## 2015-02-28 NOTE — Telephone Encounter (Signed)
Per Dr. Benay Spice; left voice message on cell# that calcium is normal, Dr. Benay Spice wishing her a great trip; call if questions.

## 2015-02-28 NOTE — Telephone Encounter (Signed)
-----   Message from Anne Pier, MD sent at 02/28/2015  3:36 PM EST ----- Please call patient, calcium is normal, have a great trip

## 2015-03-18 ENCOUNTER — Other Ambulatory Visit: Payer: Self-pay | Admitting: *Deleted

## 2015-03-18 DIAGNOSIS — C20 Malignant neoplasm of rectum: Secondary | ICD-10-CM

## 2015-03-18 MED ORDER — OXYCODONE-ACETAMINOPHEN 10-325 MG PO TABS: 1.0000 | ORAL_TABLET | ORAL | Status: DC | PRN

## 2015-03-18 NOTE — Telephone Encounter (Signed)
Left voice message on Pt and sister's # that re-fill is ready for p/u

## 2015-03-19 ENCOUNTER — Other Ambulatory Visit: Payer: Self-pay | Admitting: *Deleted

## 2015-03-19 ENCOUNTER — Encounter (HOSPITAL_COMMUNITY): Payer: Self-pay

## 2015-03-20 ENCOUNTER — Encounter (HOSPITAL_COMMUNITY): Payer: Self-pay

## 2015-03-20 ENCOUNTER — Telehealth: Payer: Self-pay | Admitting: *Deleted

## 2015-03-20 ENCOUNTER — Other Ambulatory Visit: Payer: Self-pay | Admitting: *Deleted

## 2015-03-20 ENCOUNTER — Ambulatory Visit (HOSPITAL_COMMUNITY)
Admission: RE | Admit: 2015-03-20 | Discharge: 2015-03-20 | Disposition: A | Discharge status: Home / Self Care | Payer: Medicare Other | Source: Ambulatory Visit | Attending: Oncology | Admitting: Oncology

## 2015-03-20 ENCOUNTER — Other Ambulatory Visit (HOSPITAL_BASED_OUTPATIENT_CLINIC_OR_DEPARTMENT_OTHER): Payer: Medicare Other

## 2015-03-20 ENCOUNTER — Telehealth: Payer: Self-pay | Admitting: Oncology

## 2015-03-20 ENCOUNTER — Ambulatory Visit (HOSPITAL_BASED_OUTPATIENT_CLINIC_OR_DEPARTMENT_OTHER): Payer: Medicare Other | Admitting: Oncology

## 2015-03-20 VITALS — BP 160/76 | HR 79 | Temp 98.4°F | Resp 18 | Ht 63.0 in | Wt 129.9 lb

## 2015-03-20 DIAGNOSIS — C7800 Secondary malignant neoplasm of unspecified lung: Secondary | ICD-10-CM

## 2015-03-20 DIAGNOSIS — C20 Malignant neoplasm of rectum: Secondary | ICD-10-CM | POA: Diagnosis present

## 2015-03-20 DIAGNOSIS — E119 Type 2 diabetes mellitus without complications: Secondary | ICD-10-CM | POA: Diagnosis not present

## 2015-03-20 DIAGNOSIS — C7802 Secondary malignant neoplasm of left lung: Secondary | ICD-10-CM | POA: Diagnosis not present

## 2015-03-20 DIAGNOSIS — C7801 Secondary malignant neoplasm of right lung: Secondary | ICD-10-CM | POA: Diagnosis not present

## 2015-03-20 DIAGNOSIS — Z902 Acquired absence of lung [part of]: Secondary | ICD-10-CM | POA: Diagnosis not present

## 2015-03-20 LAB — COMPREHENSIVE METABOLIC PANEL
ALBUMIN: 4.2 g/dL (ref 3.5–5.0)
ALK PHOS: 47 U/L (ref 40–150)
ALT: 16 U/L (ref 0–55)
AST: 20 U/L (ref 5–34)
Anion Gap: 10 mEq/L (ref 3–11)
BUN: 14.9 mg/dL (ref 7.0–26.0)
CO2: 26 meq/L (ref 22–29)
Calcium: 10.2 mg/dL (ref 8.4–10.4)
Chloride: 104 mEq/L (ref 98–109)
Creatinine: 0.7 mg/dL (ref 0.6–1.1)
EGFR: 88 mL/min/{1.73_m2} — ABNORMAL LOW (ref >90)
GLUCOSE: 127 mg/dL (ref 70–140)
POTASSIUM: 4.1 meq/L (ref 3.5–5.1)
SODIUM: 140 meq/L (ref 136–145)
TOTAL PROTEIN: 7.3 g/dL (ref 6.4–8.3)
Total Bilirubin: 0.48 mg/dL (ref 0.20–1.20)

## 2015-03-20 LAB — UA PROTEIN, DIPSTICK - CHCC: PROTEIN: NEGATIVE mg/dL

## 2015-03-20 LAB — CBC WITH DIFFERENTIAL/PLATELET
BASO%: 1.8 % (ref 0.0–2.0)
Basophils Absolute: 0.1 10*3/uL (ref 0.0–0.1)
EOS%: 1.8 % (ref 0.0–7.0)
Eosinophils Absolute: 0.1 10*3/uL (ref 0.0–0.5)
HEMATOCRIT: 37 % (ref 34.8–46.6)
HEMOGLOBIN: 11.9 g/dL (ref 11.6–15.9)
LYMPH#: 0.7 10*3/uL — AB (ref 0.9–3.3)
LYMPH%: 22.3 % (ref 14.0–49.7)
MCH: 29.2 pg (ref 25.1–34.0)
MCHC: 32.2 g/dL (ref 31.5–36.0)
MCV: 90.7 fL (ref 79.5–101.0)
MONO#: 0.6 10*3/uL (ref 0.1–0.9)
MONO%: 16.6 % — AB (ref 0.0–14.0)
NEUT#: 1.9 10*3/uL (ref 1.5–6.5)
NEUT%: 57.5 % (ref 38.4–76.8)
Platelets: 192 10*3/uL (ref 145–400)
RBC: 4.08 10*6/uL (ref 3.70–5.45)
RDW: 17.9 % — AB (ref 11.2–14.5)
WBC: 3.3 10*3/uL — ABNORMAL LOW (ref 3.9–10.3)

## 2015-03-20 MED ORDER — IOHEXOL 300 MG/ML  SOLN
75.0000 mL | Freq: Once | INTRAMUSCULAR | Status: AC | PRN
  Administered 2015-03-20: 75 mL via INTRAVENOUS

## 2015-03-20 MED ORDER — METHYLPHENIDATE HCL ER (OSM) 27 MG PO TBCR: 27.0000 mg | EXTENDED_RELEASE_TABLET | Freq: Every day | ORAL | Status: DC

## 2015-03-20 NOTE — Telephone Encounter (Signed)
per pof to sch pt appt-sent MW emailt o sch pt trmt-pt to get updated scf 12/22 in trmt room

## 2015-03-20 NOTE — Progress Notes (Addendum)
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 She completed another cycle of FOLFOX/Avastin 02/25/2015. She then took a trip to [place]. She felt well while in [place]. She has noted an increased cough. No significant dyspnea. No other complaint. She [place] and attributes this to decreased feeling  At the sole of her foot. She generally  Has no problem with ambulation.  Objective:  Vital signs in last 24 hours:  Blood pressure 160/76, pulse 79, temperature 98.4 F (36.9 C), temperature source Oral, resp. rate 18, height '5\' 3"'$  (1.6 m), weight 129 lb 14.4 oz (58.922 kg), SpO2 99 %.    HEENT:  No thrush or ulcers Lymphatics:  No cervical, supra-clavicular, axillary, or inguinal nodes Resp:  Lungs clear bilaterally with decreased breath sounds at the right lower chest, no respiratory distress Cardio:  Regular rate and rhythm GI:  No hepatomegaly, no mass, nontender Vascular:  Trace edema at the left foot     Portacath/PICC-without erythema  Lab Results:  Lab Results  Component Value Date   WBC 3.3* 03/20/2015   HGB 11.9 03/20/2015   HCT 37.0 03/20/2015   MCV 90.7 03/20/2015   PLT 192 03/20/2015   NEUTROABS 1.9 03/20/2015      Lab Results  Component Value Date   CEA 10.7* 02/28/2015    Imaging:  Ct Chest W Contrast  03/20/2015  CLINICAL DATA:  Metastatic rectal cancer with lung nodules, status post resection, cough EXAM: CT CHEST WITH CONTRAST TECHNIQUE: Multidetector CT imaging of the chest was performed during intravenous contrast administration. CONTRAST:  70m OMNIPAQUE IOHEXOL 300 MG/ML  SOLN COMPARISON:  CT chest dated 12/26/2014 FINDINGS: Mediastinum/Nodes: Heart is normal in size. No pericardial effusion. Three vessel coronary atherosclerosis. Mild atherosclerotic calcifications of the aortic arch. Left chest port terminates in the mid SVC. 7 mm  short axis subcarinal node. Visualized thyroid is mildly heterogeneous/ nodular. Lungs/Pleura: Status post right middle and lower lobectomy. Accessory apical right upper lobe bronchus (series 5/ image 26). Bilateral pulmonary nodules/metastases, mildly progressed, including: --1.9 x 2.1 cm nodule medially in the right upper lobe (series 5/ image 21), previously 1.8 x 2.0 cm --12 mm nodule in the posterior left upper lobe (series 5/ image 28), previously 11 mm --2.8 x 1.9 cm nodule posteriorly in the right upper lobe (series 5/ image 32), with adjacent satellite nodularity (series 5/ images 32-36), previously 2.7 x 1.7 cm --14 mm nodule in the left lower lobe (series 5/ image 37), previously 11 mm Trace right pleural effusion. Mild paraseptal emphysematous changes in the right lung. No pneumothorax. Upper abdomen: Visualized upper abdomen is notable for a 14 mm short axis portacaval node and associated periportal soft tissue/inflammatory changes in the right upper abdomen (series 2/ image 56), grossly unchanged. Musculoskeletal: Degenerative changes of the visualized thoracolumbar spine. Mild anterior wedging at T12. IMPRESSION: Status post right middle and lower lobectomy. Progression of bilateral pulmonary nodules/metastases, as above. Electronically Signed   By: SJulian HyM.D.   On: 03/20/2015 10:03    Medications: I have reviewed the patient's current medications.  Assessment/Plan: 1. Metastatic rectal cancer diagnosed July 2014. MSI stable on the March 2015 resection specimen. Foundation 1  Testing confirmed a KRAS G13 mutation, APC mutation, and BRIP1 mutation.  Colonoscopy on 10/25/2012 showed a bulky circumferential friable mass lesion in the rectum at approximately 4 cm above the dentate line.  The mass was subtotally obstructing and would not permit passage of the colonoscope proximal. Biopsy showed adenocarcinoma.   Staging CT scans 10/25/2012 showed a right lower lobe lung mass measuring  5 x 4.6 cm; 0.8 cm nodule right upper lobe; masslike rectosigmoid wall thickening, surrounding stranding and adjacent lymphadenopathy.   PET scan 11/02/2012 showed hypermetabolism in the area of apparent circumferential rectal wall thickening; the 5 cm right lower lobe pulmonary mass was hypermetabolic with SUV max equal 8. No other hypermetabolic lung lesions were evident. Small bilateral pulmonary parenchymal nodules were noted.   She completed 5 cycles of FOLFOX chemotherapy 11/22/2012 through 02/13/2013.   Status post right thoracotomy, bilobectomy (right lower and middle lobectomy) with en bloc resection of the diaphragm 02/27/2013. Pathology showed adenocarcinoma consistent with metastatic colorectal adenocarcinoma, 4.3 cm, negative margins, no lymph node involvement. Negative soft tissue biopsy of the diaphragm x2. Positive for K-ras mutation-codon 13   She completed concurrent radiation and Xeloda for treatment of the rectal tumor 03/06/2013 through 04/11/2013.   Status post low anterior resection with a diverting loop ileostomy on 05/31/2013 at [facility] in [place]. Final pathology showed moderately differentiated adenocarcinoma located in the upper rectum; the tumor measured 2.5 x 2.2 cm; there was invasion through the muscularis propria into pericolic adipose tissue; tumor budding was present. Lymphatic status negative; vascular status negative; proximal and distal surgical margins negative; 2 of 17 lymph nodes showed metastatic adenocarcinoma. Mesenteric tumor deposits were present. Perineural invasion present. [facility] confirmed an additional positive lymph node and the tumor was staged as a T4a   CT abdomen/pelvis 05/12/2013. Metastatic nodules in the lung increased. Chronic loculated right base pneumothorax. Diffuse mucosal thickening of the sigmoid portion of the colon.   Restaging CTs 07/25/2013 with enlargement of bilateral lung nodules, no other evidence of  metastatic disease   Ileostomy takedown 08/14/2013   Chest CT 10/02/2013 revealed enlargement of bilateral lung nodules and increased right pleural fluid   Cycle 1 of FOLFIRI/Avastin 10/03/2013   Cycle 6 of FOLFIRI/Avastin 12/12/2013   Restaging CT 12/22/2013 with a decrease in the size of bilateral lung nodules, no evidence of disease progression   Cycle 7 FOLFIRI/Avastin 12/25/2013  Cycle 8 FOLFIRI/Avastin 01/10/2014  Cycle 9 FOLFIRI/Avastin 01/24/2014  Cycle 10 FOLFIRI/Avastin 02/07/2014  Cycle of 11 FOLFIRI/Avastin 02/21/2014  Restaging CT 03/05/2014 with a decrease in the size of 2 dominant right lung nodules, no new nodules, new pleural gas collection at the right apex  Cycle 12 FOLFIRI/Avastin 03/07/2014  Cycle 13 FOLFIRI/Avastin 03/20/2014  Cycle 14 FOLFIRI/Avastin 04/04/2014  Initiation of maintenance Xeloda/Avastin 04/18/2014  CT chest 07/09/2014 revealed slight enlargement of lung nodules  Treatment with single agent Avastin 07/10/2014  Resumption of FOLFIRI/Avastin on a 2 week schedule 07/25/2014  CT angio chest 09/14/2014 with essentially stable multifocal pulmonary metastatic disease.  5-fluorouracil dose reduced beginning 09/19/2014  FOLFIRI/Avastin switch to a 3 week schedule beginning with treatment 10/10/2014 secondary to malaise, diarrhea, and mucositis, irinotecan dose reduced  CT 12/26/2014 confirmed an increase in the size of bilateral lung nodules  FOLFOX/Avastin initiated on a 2 week schedule 01/02/2015   CT chest 03/20/2015 with slight enlargement of multiple lung nodules 2. Status post right VATS, drainage of pleural effusion, visceral and parietal pleural decortication 12/21/2012. Pleural fluid culture positive for viridans Streptococcus. Right pleura biopsies negative for malignancy. Findings favored empyema. 3. Diabetes. 4. Frequent bowel movements, diminished pelvic muscle tone-we made a referral to the pelvic physical therapy  clinic 5. Pain in  the left leg with tenderness at the left calf and left ischium-MRI of the lumbar spine and pelvis on 10/13/2011 with no evidence of metastatic disease. A right iliac bone "lesion" was felt to most likely represent heterogenous marrow. Edema and enhancement was noted at the left sciatic nerve compared to the right side with no mass lesion 6. Hypertension-on HCTZ 7. Left foot fracture 8. BRIP1 mutation 9. Mucositis most likely related to 5-fluorouracil. Dose reduction of 5 fluorouracil beginning 09/19/2014.    Disposition:   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 The chest CT reveals slight progression of lung nodules. I reviewed the CT with a radiologist and it appears the lung nodules have slowly enlarged over the past year. The CEA is now mildly elevated.   I discussed treatment options with Dr. [name] including continuation of FOLFOX/Avastin. We also discussed Lonsurf therapy. She has a BRIP1 mutation Lelon Frohlich may also be a candidate for a Parp inhibitor.     We decided to continue FOLFOX Avastin for now. I will consult with Dr. Reynaldo Minium to discuss standard and clinical trial options.   Dr. [name] is scheduled for chemotherapy 03/21/2015. She will return for an office visit and chemotherapy on 04/02/2014.   Approximately 40 minutes were spent with the patient today. The majority of the time was used for counseling and coordination of care.  Betsy Coder, MD  03/20/2015  10:44 AM  Addendum: The plan is to refer her to Dr. Reynaldo Minium to consider enrollment on an immunotherapy trial. She will complete a final planned cycle of FOLFOX/Avastin 03/21/2015.

## 2015-03-20 NOTE — Telephone Encounter (Signed)
Per staff message and POF I have scheduled appts. Advised scheduler of appts and first available given on 1/4 [name]

## 2015-03-21 ENCOUNTER — Telehealth: Payer: Self-pay | Admitting: Oncology

## 2015-03-21 ENCOUNTER — Other Ambulatory Visit: Payer: Self-pay | Admitting: Oncology

## 2015-03-21 ENCOUNTER — Ambulatory Visit: Payer: Medicare Other | Admitting: Oncology

## 2015-03-21 ENCOUNTER — Other Ambulatory Visit: Payer: 59

## 2015-03-21 ENCOUNTER — Ambulatory Visit: Payer: 59 | Admitting: Nurse Practitioner

## 2015-03-21 ENCOUNTER — Ambulatory Visit (HOSPITAL_BASED_OUTPATIENT_CLINIC_OR_DEPARTMENT_OTHER): Payer: Medicare Other

## 2015-03-21 VITALS — BP 140/59 | HR 78 | Temp 99.0°F | Resp 18

## 2015-03-21 DIAGNOSIS — C7801 Secondary malignant neoplasm of right lung: Secondary | ICD-10-CM

## 2015-03-21 DIAGNOSIS — C20 Malignant neoplasm of rectum: Secondary | ICD-10-CM | POA: Diagnosis not present

## 2015-03-21 DIAGNOSIS — Z5111 Encounter for antineoplastic chemotherapy: Secondary | ICD-10-CM

## 2015-03-21 DIAGNOSIS — C7802 Secondary malignant neoplasm of left lung: Secondary | ICD-10-CM

## 2015-03-21 DIAGNOSIS — C7800 Secondary malignant neoplasm of unspecified lung: Principal | ICD-10-CM

## 2015-03-21 DIAGNOSIS — Z5112 Encounter for antineoplastic immunotherapy: Secondary | ICD-10-CM | POA: Diagnosis not present

## 2015-03-21 LAB — CEA: CEA: 12.7 ng/mL — ABNORMAL HIGH (ref 0.0–5.0)

## 2015-03-21 MED ORDER — BEVACIZUMAB CHEMO INJECTION 400 MG/16ML
5.0000 mg/kg | Freq: Once | INTRAVENOUS | Status: AC
  Administered 2015-03-21: 275 mg via INTRAVENOUS
  Filled 2015-03-21: qty 11

## 2015-03-21 MED ORDER — LEUCOVORIN CALCIUM INJECTION 350 MG
200.0000 mg/m2 | Freq: Once | INTRAVENOUS | Status: AC
  Administered 2015-03-21: 320 mg via INTRAVENOUS
  Filled 2015-03-21: qty 16

## 2015-03-21 MED ORDER — FLUOROURACIL CHEMO INJECTION 500 MG/10ML
200.0000 mg/m2 | Freq: Once | INTRAVENOUS | Status: AC
  Administered 2015-03-21: 300 mg via INTRAVENOUS
  Filled 2015-03-21: qty 6

## 2015-03-21 MED ORDER — SODIUM CHLORIDE 0.9 % IV SOLN
Freq: Once | INTRAVENOUS | Status: AC
  Administered 2015-03-21: 09:00:00 via INTRAVENOUS

## 2015-03-21 MED ORDER — SODIUM CHLORIDE 0.9 % IJ SOLN
10.0000 mL | INTRAMUSCULAR | Status: DC | PRN
  Filled 2015-03-21: qty 10

## 2015-03-21 MED ORDER — OXALIPLATIN CHEMO INJECTION 100 MG/20ML
85.0000 mg/m2 | Freq: Once | INTRAVENOUS | Status: AC
  Administered 2015-03-21: 135 mg via INTRAVENOUS
  Filled 2015-03-21: qty 27

## 2015-03-21 MED ORDER — DEXTROSE 5 % IV SOLN
Freq: Once | INTRAVENOUS | Status: AC
  Administered 2015-03-21: 10:00:00 via INTRAVENOUS

## 2015-03-21 MED ORDER — HEPARIN SOD (PORK) LOCK FLUSH 100 UNIT/ML IV SOLN
500.0000 [IU] | Freq: Once | INTRAVENOUS | Status: DC | PRN
  Filled 2015-03-21: qty 5

## 2015-03-21 MED ORDER — SODIUM CHLORIDE 0.9 % IV SOLN
Freq: Once | INTRAVENOUS | Status: AC
  Administered 2015-03-21: 09:00:00 via INTRAVENOUS
  Filled 2015-03-21: qty 4

## 2015-03-21 MED ORDER — SODIUM CHLORIDE 0.9 % IV SOLN
1680.0000 mg/m2 | INTRAVENOUS | Status: DC
  Administered 2015-03-21: 2700 mg via INTRAVENOUS
  Filled 2015-03-21: qty 54

## 2015-03-21 NOTE — Telephone Encounter (Signed)
appt scheduled with Dr. Reynaldo Minium on 04/04/15 at [place] for pt [facility] will mail out packet

## 2015-03-21 NOTE — Patient Instructions (Signed)
[facility] Discharge Instructions for Patients Receiving Chemotherapy  Today you received the following chemotherapy agents Avastin/Oxaliplatin/Leucovorin/Fluorouracil.  To help prevent nausea and vomiting after your treatment, we encourage you to take your nausea medication as directed.   If you develop nausea and vomiting that is not controlled by your nausea medication, call the clinic.   BELOW ARE SYMPTOMS THAT SHOULD BE REPORTED IMMEDIATELY:  *FEVER GREATER THAN 100.5 F  *CHILLS WITH OR WITHOUT FEVER  NAUSEA AND VOMITING THAT IS NOT CONTROLLED WITH YOUR NAUSEA MEDICATION  *UNUSUAL SHORTNESS OF BREATH  *UNUSUAL BRUISING OR BLEEDING  TENDERNESS IN MOUTH AND THROAT WITH OR WITHOUT PRESENCE OF ULCERS  *URINARY PROBLEMS  *BOWEL PROBLEMS  UNUSUAL RASH Items with * indicate a potential emergency and should be followed up as soon as possible.  Feel free to call the clinic you have any questions or concerns. The clinic phone number is [phone].  Please show the [facility] at check-in to the Emergency Department and triage nurse.

## 2015-03-21 NOTE — Progress Notes (Signed)
Per Dr. Benay Spice pt states her pump will be d/c'd at 1730 12/22.  Chemo rate not adjusted, pt will just have pump d/c'd at appt time and rest of chemo will not infuse.  Pt aware of plan and in agreement.

## 2015-03-22 ENCOUNTER — Ambulatory Visit (HOSPITAL_BASED_OUTPATIENT_CLINIC_OR_DEPARTMENT_OTHER): Payer: Medicare Other

## 2015-03-22 VITALS — BP 156/72 | HR 75 | Temp 98.0°F | Resp 16

## 2015-03-22 DIAGNOSIS — C7802 Secondary malignant neoplasm of left lung: Secondary | ICD-10-CM | POA: Diagnosis not present

## 2015-03-22 DIAGNOSIS — C20 Malignant neoplasm of rectum: Secondary | ICD-10-CM

## 2015-03-22 DIAGNOSIS — C7800 Secondary malignant neoplasm of unspecified lung: Principal | ICD-10-CM

## 2015-03-22 DIAGNOSIS — C7801 Secondary malignant neoplasm of right lung: Secondary | ICD-10-CM

## 2015-03-22 MED ORDER — SODIUM CHLORIDE 0.9 % IJ SOLN
10.0000 mL | INTRAMUSCULAR | Status: DC | PRN
  Administered 2015-03-22: 10 mL
  Filled 2015-03-22: qty 10

## 2015-03-22 MED ORDER — HEPARIN SOD (PORK) LOCK FLUSH 100 UNIT/ML IV SOLN
500.0000 [IU] | Freq: Once | INTRAVENOUS | Status: AC | PRN
  Administered 2015-03-22: 500 [IU]
  Filled 2015-03-22: qty 5

## 2015-03-22 NOTE — Patient Instructions (Signed)

## 2015-03-26 ENCOUNTER — Telehealth: Payer: Self-pay | Admitting: *Deleted

## 2015-03-26 NOTE — Telephone Encounter (Signed)
Pt called reports "my cough has been consistent and has increased since Friday last week and wanted to know what Dr. Benay Spice thinks; more productive since last week; but did not wake up or keep me up coughing last night"  Pt denies fever/chill/shaking, no diarrhea/vomiting; eating and drinking well.  Per Dr. Benay Spice; informed pt that MD said to just watch from now; can get OTC med to help with symptoms; would not recommend cxr since pt was just scanned on 12/21; if worse to call office. Pt verbalized understanding and expressed appreciation.

## 2015-04-01 ENCOUNTER — Other Ambulatory Visit: Payer: Self-pay | Admitting: Oncology

## 2015-04-03 ENCOUNTER — Telehealth: Payer: Self-pay | Admitting: *Deleted

## 2015-04-03 ENCOUNTER — Other Ambulatory Visit: Payer: Medicare Other

## 2015-04-03 ENCOUNTER — Ambulatory Visit: Payer: Medicare Other | Admitting: Oncology

## 2015-04-03 ENCOUNTER — Ambulatory Visit: Payer: Medicare Other

## 2015-04-03 NOTE — Telephone Encounter (Signed)
Called pt to follow up after missed visit this morning. Spoke with sister, she reports she is "ill." Has had a bad cough/ and cold that is not getting better and did not have enough energy to come in to office. Cough productive yellow sputum.  Spoke with pt, she reports cough seems to be improving, denies dyspnea or fever. Was seen at [facility] 04/02/15. Dr. Dennison Nancy will send information re: clinical trials she may be eligible for.

## 2015-04-04 ENCOUNTER — Encounter: Payer: Self-pay | Admitting: *Deleted

## 2015-04-04 ENCOUNTER — Telehealth: Payer: Self-pay | Admitting: Oncology

## 2015-04-04 NOTE — Telephone Encounter (Signed)
Per 1/5 pof lab/fu/tx 1/12. Inf at capacity 1/12. Per desk appointments can be 1/9 and 1/11 if patient agrees. Patient agrees and was given new appointments for 1/9 and 1/11 - appointments for 1/18 cxd due to 1/4 tx delayed til 1/9.

## 2015-04-05 ENCOUNTER — Other Ambulatory Visit: Payer: Self-pay | Admitting: *Deleted

## 2015-04-07 ENCOUNTER — Other Ambulatory Visit: Payer: Self-pay | Admitting: Oncology

## 2015-04-08 ENCOUNTER — Telehealth: Payer: Self-pay | Admitting: Pharmacist

## 2015-04-08 ENCOUNTER — Ambulatory Visit: Payer: Medicare Other

## 2015-04-08 ENCOUNTER — Telehealth: Payer: Self-pay | Admitting: Oncology

## 2015-04-08 ENCOUNTER — Ambulatory Visit (HOSPITAL_BASED_OUTPATIENT_CLINIC_OR_DEPARTMENT_OTHER): Payer: Medicare Other | Admitting: Oncology

## 2015-04-08 ENCOUNTER — Other Ambulatory Visit (HOSPITAL_BASED_OUTPATIENT_CLINIC_OR_DEPARTMENT_OTHER): Payer: Medicare Other

## 2015-04-08 ENCOUNTER — Other Ambulatory Visit: Payer: Self-pay | Admitting: *Deleted

## 2015-04-08 VITALS — BP 157/69 | HR 76 | Temp 97.8°F | Resp 16 | Ht 63.0 in | Wt 123.4 lb

## 2015-04-08 DIAGNOSIS — E119 Type 2 diabetes mellitus without complications: Secondary | ICD-10-CM

## 2015-04-08 DIAGNOSIS — I10 Essential (primary) hypertension: Secondary | ICD-10-CM

## 2015-04-08 DIAGNOSIS — C20 Malignant neoplasm of rectum: Secondary | ICD-10-CM

## 2015-04-08 DIAGNOSIS — C7800 Secondary malignant neoplasm of unspecified lung: Principal | ICD-10-CM

## 2015-04-08 LAB — COMPREHENSIVE METABOLIC PANEL
ALT: 16 U/L (ref 0–55)
ANION GAP: 11 meq/L (ref 3–11)
AST: 19 U/L (ref 5–34)
Albumin: 4.3 g/dL (ref 3.5–5.0)
Alkaline Phosphatase: 46 U/L (ref 40–150)
BUN: 8.7 mg/dL (ref 7.0–26.0)
CO2: 30 meq/L — AB (ref 22–29)
CREATININE: 0.7 mg/dL (ref 0.6–1.1)
Calcium: 10.4 mg/dL (ref 8.4–10.4)
Chloride: 100 mEq/L (ref 98–109)
EGFR: 84 mL/min/{1.73_m2} — ABNORMAL LOW (ref >90)
GLUCOSE: 137 mg/dL (ref 70–140)
Potassium: 3.8 mEq/L (ref 3.5–5.1)
Sodium: 141 mEq/L (ref 136–145)
TOTAL PROTEIN: 7.8 g/dL (ref 6.4–8.3)
Total Bilirubin: 0.38 mg/dL (ref 0.20–1.20)

## 2015-04-08 LAB — CBC WITH DIFFERENTIAL/PLATELET
BASO%: 0.7 % (ref 0.0–2.0)
BASOS ABS: 0 10*3/uL (ref 0.0–0.1)
EOS%: 3.9 % (ref 0.0–7.0)
Eosinophils Absolute: 0.2 10*3/uL (ref 0.0–0.5)
HCT: 39 % (ref 34.8–46.6)
HGB: 12.8 g/dL (ref 11.6–15.9)
LYMPH#: 0.8 10*3/uL — AB (ref 0.9–3.3)
LYMPH%: 19.6 % (ref 14.0–49.7)
MCH: 29.2 pg (ref 25.1–34.0)
MCHC: 32.8 g/dL (ref 31.5–36.0)
MCV: 89 fL (ref 79.5–101.0)
MONO#: 0.6 10*3/uL (ref 0.1–0.9)
MONO%: 14.5 % — ABNORMAL HIGH (ref 0.0–14.0)
NEUT#: 2.5 10*3/uL (ref 1.5–6.5)
NEUT%: 61.3 % (ref 38.4–76.8)
PLATELETS: 260 10*3/uL (ref 145–400)
RBC: 4.38 10*6/uL (ref 3.70–5.45)
RDW: 16.8 % — ABNORMAL HIGH (ref 11.2–14.5)
WBC: 4.1 10*3/uL (ref 3.9–10.3)

## 2015-04-08 LAB — UA PROTEIN, DIPSTICK - CHCC: Protein, ur: <30 mg/dL

## 2015-04-08 MED ORDER — OXYCODONE-ACETAMINOPHEN 10-325 MG PO TABS: 1.0000 | ORAL_TABLET | ORAL | Status: DC | PRN

## 2015-04-08 MED ORDER — ONDANSETRON HCL 8 MG PO TABS: 8.0000 mg | ORAL_TABLET | Freq: Three times a day (TID) | ORAL | Status: AC | PRN

## 2015-04-08 MED FILL — ONDANSETRON HCL 8 MG TABLET: 8 | 10 days supply | Qty: 30 | Fill #0

## 2015-04-08 MED FILL — JANUVIA 100 MG TABLET: 100 | 30 days supply | Qty: 30 | Fill #1 | Status: TO

## 2015-04-08 MED FILL — OXYCODONE-APAP 10-325 TAB: 10-325 | 10 days supply | Qty: 60 | Fill #0

## 2015-04-08 MED FILL — MINIVELLE 0.0375 MG PATCH: 0.0375 | 28 days supply | Qty: 8 | Fill #5

## 2015-04-08 NOTE — Progress Notes (Signed)
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 She completed another cycle of FOLFOX/Avastin 03/21/2015. She developed a productive cough last week. There was associated anorexia. The cough has improved. No change in baseline dyspnea.  She was seen by Dr. Dennison Nancy at [facility] on 04/02/2015. She cannot remember specifics of the conversation. She was not offered enrollment on a specific clinical trial.  Objective:  Vital signs in last 24 hours:  Blood pressure 157/69, pulse 76, temperature 97.8 F (36.6 C), temperature source Oral, resp. rate 16, height '5\' 3"'$  (1.6 m), weight 123 lb 6.4 oz (55.974 kg), SpO2 100 %.   Resp: Lungs with decreased breath sounds at the right lower chest, no respiratory distress Cardio: Regular rate and rhythm GI: No hepatomegaly, nontender Vascular: No leg edema   Portacath/PICC-without erythema  Lab Results:  Lab Results  Component Value Date   WBC 4.1 04/08/2015   HGB 12.8 04/08/2015   HCT 39.0 04/08/2015   MCV 89.0 04/08/2015   PLT 260 04/08/2015   NEUTROABS 2.5 04/08/2015     Lab Results  Component Value Date   CEA 12.7* 03/20/2015   Medications: I have reviewed the patient's current medications.  Assessment/Plan: 1. Metastatic rectal cancer diagnosed July 2014. MSI stable on the March 2015 resection specimen. Foundation 1 Testing confirmed a KRAS G13 mutation, APC mutation, and BRIP1 mutation.  Colonoscopy on 10/25/2012 showed a bulky circumferential friable mass lesion in the rectum at approximately 4 cm above the dentate line. The mass was subtotally obstructing and would not permit passage of the colonoscope proximal. Biopsy showed adenocarcinoma.   Staging CT scans 10/25/2012 showed a right lower lobe lung mass measuring 5 x 4.6 cm; 0.8 cm nodule right upper lobe; masslike rectosigmoid wall thickening, surrounding stranding and adjacent lymphadenopathy.    PET scan 11/02/2012 showed hypermetabolism in the area of apparent circumferential rectal wall thickening; the 5 cm right lower lobe pulmonary mass was hypermetabolic with SUV max equal 8. No other hypermetabolic lung lesions were evident. Small bilateral pulmonary parenchymal nodules were noted.   She completed 5 cycles of FOLFOX chemotherapy 11/22/2012 through 02/13/2013.   Status post right thoracotomy, bilobectomy (right lower and middle lobectomy) with en bloc resection of the diaphragm 02/27/2013. Pathology showed adenocarcinoma consistent with metastatic colorectal adenocarcinoma, 4.3 cm, negative margins, no lymph node involvement. Negative soft tissue biopsy of the diaphragm x2. Positive for K-ras mutation-codon 13   She completed concurrent radiation and Xeloda for treatment of the rectal tumor 03/06/2013 through 04/11/2013.   Status post low anterior resection with a diverting loop ileostomy on 05/31/2013 at [facility] in [place]. Final pathology showed moderately differentiated adenocarcinoma located in the upper rectum; the tumor measured 2.5 x 2.2 cm; there was invasion through the muscularis propria into pericolic adipose tissue; tumor budding was present. Lymphatic status negative; vascular status negative; proximal and distal surgical margins negative; 2 of 17 lymph nodes showed metastatic adenocarcinoma. Mesenteric tumor deposits were present. Perineural invasion present. [facility] confirmed an additional positive lymph node and the tumor was staged as a T4a   CT abdomen/pelvis 05/12/2013. Metastatic nodules in the lung increased. Chronic loculated right base pneumothorax. Diffuse mucosal thickening of the sigmoid portion of the colon.   Restaging CTs 07/25/2013 with enlargement of bilateral lung nodules, no other evidence of metastatic disease   Ileostomy takedown 08/14/2013   Chest CT 10/02/2013 revealed enlargement of bilateral lung nodules and  increased right pleural fluid   Cycle 1 of FOLFIRI/Avastin 10/03/2013   Cycle 6 of FOLFIRI/Avastin 12/12/2013   Restaging CT 12/22/2013 with a decrease in the size of bilateral lung nodules, no evidence of disease progression   Cycle 7 FOLFIRI/Avastin 12/25/2013  Cycle 8 FOLFIRI/Avastin 01/10/2014  Cycle 9 FOLFIRI/Avastin 01/24/2014  Cycle 10 FOLFIRI/Avastin 02/07/2014  Cycle of 11 FOLFIRI/Avastin 02/21/2014  Restaging CT 03/05/2014 with a decrease in the size of 2 dominant right lung nodules, no new nodules, new pleural gas collection at the right apex  Cycle 12 FOLFIRI/Avastin 03/07/2014  Cycle 13 FOLFIRI/Avastin 03/20/2014  Cycle 14 FOLFIRI/Avastin 04/04/2014  Initiation of maintenance Xeloda/Avastin 04/18/2014  CT chest 07/09/2014 revealed slight enlargement of lung nodules  Treatment with single agent Avastin 07/10/2014  Resumption of FOLFIRI/Avastin on a 2 week schedule 07/25/2014  CT angio chest 09/14/2014 with essentially stable multifocal pulmonary metastatic disease.  5-fluorouracil dose reduced beginning 09/19/2014  FOLFIRI/Avastin switch to a 3 week schedule beginning with treatment 10/10/2014 secondary to malaise, diarrhea, and mucositis, irinotecan dose reduced  CT 12/26/2014 confirmed an increase in the size of bilateral lung nodules  FOLFOX/Avastin initiated on a 2 week schedule 01/02/2015  CT chest 03/20/2015 with slight enlargement of multiple lung nodules  Treatment switch to Lonsurf beginning 04/15/2015 2. Status post right VATS, drainage of pleural effusion, visceral and parietal pleural decortication 12/21/2012. Pleural fluid culture positive for viridans Streptococcus. Right pleura biopsies negative for malignancy. Findings favored empyema. 3. Diabetes. 4. Frequent bowel movements, diminished pelvic muscle tone-we made a referral to the pelvic physical therapy clinic 5. Pain in the left leg with tenderness at the left calf and left  ischium-MRI of the lumbar spine and pelvis on 10/13/2011 with no evidence of metastatic disease. A right iliac bone "lesion" was felt to most likely represent heterogenous marrow. Edema and enhancement was noted at the left sciatic nerve compared to the right side with no mass lesion 6. Hypertension-on HCTZ 7. Left foot fracture 8. BRIP1 mutation 9. Mucositis most likely related to 5-fluorouracil. Dose reduction of 5 fluorouracil beginning 09/19/2014.   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 The productive cough last week was likely related to a viral upper respiratory infection. She will contact us for progression of the cough or development of dyspnea.  We discussed standard treatment options including regorafenib and Lonsurf. She agrees to proceed with Lonsurf. We reviewed the potential toxicities associated with this agent including the chance for a rash, diarrhea, hematologic toxicity, and nausea. She agrees to proceed.  I will contact the GI oncology service at [facility] to discuss clinical trial eligibility.   Dr. [name] will return for an office visit and Port-A-Cath flush 04/30/2015.  Betsy Coder, MD  04/08/2015  8:43 AM

## 2015-04-08 NOTE — Telephone Encounter (Signed)
04/08/15: Rx for Lonsurf faxed to [facility]

## 2015-04-08 NOTE — Telephone Encounter (Signed)
Scheduled patient per pof, avs report printed.  °

## 2015-04-09 LAB — CEA (PARALLEL TESTING): CEA: 17.2 ng/mL — ABNORMAL HIGH (ref 0.0–5.0)

## 2015-04-09 LAB — CEA: CEA: 53 ng/mL — ABNORMAL HIGH (ref 0.0–4.7)

## 2015-04-10 ENCOUNTER — Telehealth: Payer: Self-pay | Admitting: *Deleted

## 2015-04-10 NOTE — Telephone Encounter (Signed)
Spoke to Dr. [name] re: "what's the plan"  Informed her that Dr. Benay Spice has written script for [facility] and it has been sent off to process through her insurance; process usually takes 5-7 business days and pharmacy will call when med is ready to pick-up; confirmed she is to start med when she receives notification.  Pt verbalized understanding and confirmed appt for 04/30/15.

## 2015-04-10 NOTE — Telephone Encounter (Signed)
Voicemail: "This is Bari Mantis O'Boyle sister of Dr. Karle Plumber.  We saw Dr. Benay Spice this week.  No F/U appointment and she has questions related to treatment plan or treatment.  Please call me by noon on mobile number [phone].  After this time call the home [phone]."   Call forwarded.

## 2015-04-11 ENCOUNTER — Telehealth: Payer: Self-pay | Admitting: *Deleted

## 2015-04-11 ENCOUNTER — Other Ambulatory Visit: Payer: Self-pay | Admitting: Oncology

## 2015-04-11 ENCOUNTER — Encounter: Payer: Self-pay | Admitting: Internal Medicine

## 2015-04-11 MED ORDER — TRIFLURIDINE-TIPIRACIL 20-8.19 MG PO TABS: ORAL_TABLET | ORAL | Status: DC

## 2015-04-11 MED ORDER — TRIFLURIDINE-TIPIRACIL 15-6.14 MG PO TABS: ORAL_TABLET | ORAL | Status: DC

## 2015-04-11 NOTE — Telephone Encounter (Signed)
Received call from pt's sister re: Lonsurf, have not received yet "do we need to move up the appt to see Dr. Benay Spice because she hasn't started anything yet?".  Called and spoke with pt and re-iterated that with getting oral chemo--usually takes 5-7 business days to process and she could call [facility] to see where it stands.  Pt states that they did call her and co-pay was going to be $2,600 for one of them (script is for Lonsurf '20mg'$  and '15mg'$ ).  "do I need to see him any earlier since I haven't been on anything for 3 weeks?"  Per Dr. Benay Spice; notified her that she does not need to come in earlier than appt on 1/31; needs to start her 1 cycle of Lonsurf and then MD will evaluate how she is doing.  Pt seemed slightly confused; re-iterated again that it takes time to process through insurance.  Office called [facility] and they stated they are waiting on pt to bring in additional paperwork re: insurance and med is on hold; reported one co-pay is $2,600 and the other is $3000.  Called pt back and informed her [facility] is waiting on her to bring in paperwork to continue to process through insurance.  Pt verbalized understanding states "I'm not sure how to do that but I will call them"

## 2015-04-16 ENCOUNTER — Telehealth: Payer: Self-pay | Admitting: Pharmacist

## 2015-04-16 NOTE — Telephone Encounter (Signed)
04/16/15 - Spoke to patient this afternoon regarding Assistance for Floyd. Patient currently inbetween insurance coverage and cost of lonsurf is > $8000. If patient qualifies for funding we may be able to obtain a month supply for her prior to her insurance being active. Her Daughter, [name], will call me back with her financial information.  Thank you,  Montel Clock, PharmD, [facility]

## 2015-04-16 NOTE — Telephone Encounter (Signed)
"  Calling to find out from Dr. Benay Spice what to do with the [facility] medication.  With the switch from [facility] coverage to [facility], I will not have medication prescription coverage until May 01, 2015.  The Lonsurf will cost $8,800 total for both strengths.  I didn't write a check for this."         Confirmed with cost with Elvina Sidle who report medication is on hold until she has prescription coverage.

## 2015-04-17 ENCOUNTER — Other Ambulatory Visit: Payer: Medicare Other

## 2015-04-17 ENCOUNTER — Ambulatory Visit: Payer: Medicare Other | Admitting: Oncology

## 2015-04-17 ENCOUNTER — Ambulatory Visit: Payer: Medicare Other

## 2015-04-19 ENCOUNTER — Telehealth: Payer: Self-pay | Admitting: *Deleted

## 2015-04-19 NOTE — Telephone Encounter (Signed)
Message from pt reporting her insurance doesn't kick in until 2/1, wonders if that is too long to be off chemo. Reviewed with Dr. Benay Spice: OK to have a break from chemo. The other option is to resume Oxaliplatin.  Discussed with pt, she states she feels better and is gaining weight while off treatment, she will wait to begin [place] in Feb.

## 2015-04-23 ENCOUNTER — Other Ambulatory Visit: Payer: Self-pay | Admitting: Oncology

## 2015-04-30 ENCOUNTER — Ambulatory Visit (HOSPITAL_BASED_OUTPATIENT_CLINIC_OR_DEPARTMENT_OTHER): Payer: Medicare Other | Admitting: Oncology

## 2015-04-30 ENCOUNTER — Ambulatory Visit (HOSPITAL_BASED_OUTPATIENT_CLINIC_OR_DEPARTMENT_OTHER): Payer: Medicare Other

## 2015-04-30 ENCOUNTER — Other Ambulatory Visit (HOSPITAL_BASED_OUTPATIENT_CLINIC_OR_DEPARTMENT_OTHER): Payer: Medicare Other

## 2015-04-30 ENCOUNTER — Ambulatory Visit: Payer: Medicare Other

## 2015-04-30 ENCOUNTER — Telehealth: Payer: Self-pay | Admitting: Oncology

## 2015-04-30 VITALS — BP 162/72 | HR 76 | Temp 98.2°F | Resp 18 | Ht 63.0 in | Wt 127.1 lb

## 2015-04-30 DIAGNOSIS — C20 Malignant neoplasm of rectum: Secondary | ICD-10-CM

## 2015-04-30 DIAGNOSIS — C7800 Secondary malignant neoplasm of unspecified lung: Secondary | ICD-10-CM

## 2015-04-30 DIAGNOSIS — E119 Type 2 diabetes mellitus without complications: Secondary | ICD-10-CM

## 2015-04-30 DIAGNOSIS — Z95828 Presence of other vascular implants and grafts: Secondary | ICD-10-CM

## 2015-04-30 DIAGNOSIS — I10 Essential (primary) hypertension: Secondary | ICD-10-CM

## 2015-04-30 LAB — CBC WITH DIFFERENTIAL/PLATELET
BASO%: 1.2 % (ref 0.0–2.0)
Basophils Absolute: 0.1 10*3/uL (ref 0.0–0.1)
EOS ABS: 0.2 10*3/uL (ref 0.0–0.5)
EOS%: 3.5 % (ref 0.0–7.0)
HCT: 37.6 % (ref 34.8–46.6)
HEMOGLOBIN: 12.4 g/dL (ref 11.6–15.9)
LYMPH%: 20.3 % (ref 14.0–49.7)
MCH: 29.1 pg (ref 25.1–34.0)
MCHC: 33.1 g/dL (ref 31.5–36.0)
MCV: 88 fL (ref 79.5–101.0)
MONO#: 0.4 10*3/uL (ref 0.1–0.9)
MONO%: 7.6 % (ref 0.0–14.0)
NEUT%: 67.4 % (ref 38.4–76.8)
NEUTROS ABS: 3.3 10*3/uL (ref 1.5–6.5)
Platelets: 217 10*3/uL (ref 145–400)
RBC: 4.27 10*6/uL (ref 3.70–5.45)
RDW: 17.2 % — AB (ref 11.2–14.5)
WBC: 5 10*3/uL (ref 3.9–10.3)
lymph#: 1 10*3/uL (ref 0.9–3.3)

## 2015-04-30 MED ORDER — HEPARIN SOD (PORK) LOCK FLUSH 100 UNIT/ML IV SOLN
500.0000 [IU] | Freq: Once | INTRAVENOUS | Status: AC
  Administered 2015-04-30: 500 [IU] via INTRAVENOUS
  Filled 2015-04-30: qty 5

## 2015-04-30 MED ORDER — OXYCODONE-ACETAMINOPHEN 10-325 MG PO TABS: 1.0000 | ORAL_TABLET | ORAL | Status: DC | PRN

## 2015-04-30 MED ORDER — SODIUM CHLORIDE 0.9% FLUSH
10.0000 mL | INTRAVENOUS | Status: DC | PRN
  Administered 2015-04-30: 10 mL via INTRAVENOUS
  Filled 2015-04-30: qty 10

## 2015-04-30 NOTE — Patient Instructions (Signed)

## 2015-04-30 NOTE — Progress Notes (Signed)
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 She reports feeling well. The cough has improved. No dyspnea. She has developed intermittent arm and hand pain. She wonders whether she may be developing arthritis.  Objective:  Vital signs in last 24 hours:  Blood pressure 162/72, pulse 76, temperature 98.2 F (36.8 C), temperature source Oral, resp. rate 18, height 5' 3" (1.6 m), weight 127 lb 1.6 oz (57.652 kg), SpO2 99 %.    HEENT: Neck without mass Resp: Lungs clear bilaterally Cardio: Regular rate and rhythm GI: No hepatosplenomegaly Vascular: No leg edema  Portacath/PICC-without erythema  Lab Results:  Lab Results  Component Value Date   WBC 5.0 04/30/2015   HGB 12.4 04/30/2015   HCT 37.6 04/30/2015   MCV 88.0 04/30/2015   PLT 217 04/30/2015   NEUTROABS 3.3 04/30/2015     Lab Results  Component Value Date   CEA 17.2* 04/08/2015    Medications: I have reviewed the patient's current medications.  Assessment/Plan: 1. Metastatic rectal cancer diagnosed July 2014. MSI stable on the March 2015 resection specimen. Foundation 1 Testing confirmed a KRAS G13 mutation, APC mutation, and BRIP1 mutation.  Colonoscopy on 10/25/2012 showed a bulky circumferential friable mass lesion in the rectum at approximately 4 cm above the dentate line. The mass was subtotally obstructing and would not permit passage of the colonoscope proximal. Biopsy showed adenocarcinoma.   Staging CT scans 10/25/2012 showed a right lower lobe lung mass measuring 5 x 4.6 cm; 0.8 cm nodule right upper lobe; masslike rectosigmoid wall thickening, surrounding stranding and adjacent lymphadenopathy.   PET scan 11/02/2012 showed hypermetabolism in the area of apparent circumferential rectal wall thickening; the 5 cm right lower lobe pulmonary mass was hypermetabolic with SUV max equal 8. No other hypermetabolic lung  lesions were evident. Small bilateral pulmonary parenchymal nodules were noted.   She completed 5 cycles of FOLFOX chemotherapy 11/22/2012 through 02/13/2013.   Status post right thoracotomy, bilobectomy (right lower and middle lobectomy) with en bloc resection of the diaphragm 02/27/2013. Pathology showed adenocarcinoma consistent with metastatic colorectal adenocarcinoma, 4.3 cm, negative margins, no lymph node involvement. Negative soft tissue biopsy of the diaphragm x2. Positive for K-ras mutation-codon 13   She completed concurrent radiation and Xeloda for treatment of the rectal tumor 03/06/2013 through 04/11/2013.   Status post low anterior resection with a diverting loop ileostomy on 05/31/2013 at [facility] in . Final pathology showed moderately differentiated adenocarcinoma located in the upper rectum; the tumor measured 2.5 x 2.2 cm; there was invasion through the muscularis propria into pericolic adipose tissue; tumor budding was present. Lymphatic status negative; vascular status negative; proximal and distal surgical margins negative; 2 of 17 lymph nodes showed metastatic adenocarcinoma. Mesenteric tumor deposits were present. Perineural invasion present. [facility] confirmed an additional positive [facility] node and the tumor was staged as a T4a   CT abdomen/pelvis 05/12/2013. Metastatic nodules in the lung increased. Chronic loculated right base pneumothorax. Diffuse mucosal thickening of the sigmoid portion of the colon.   Restaging CTs 07/25/2013 with enlargement of bilateral lung nodules, no other evidence of metastatic disease   Ileostomy takedown 08/14/2013   Chest CT 10/02/2013 revealed enlargement of bilateral lung nodules and increased right pleural fluid   Cycle 1 of FOLFIRI/Avastin 10/03/2013   Cycle 6 of FOLFIRI/Avastin 12/12/2013   Restaging CT 12/22/2013 with a decrease in the size of bilateral lung nodules, no evidence   of disease  progression   Cycle 7 FOLFIRI/Avastin 12/25/2013  Cycle 8 FOLFIRI/Avastin 01/10/2014  Cycle 9 FOLFIRI/Avastin 01/24/2014  Cycle 10 FOLFIRI/Avastin 02/07/2014  Cycle of 11 FOLFIRI/Avastin 02/21/2014  Restaging CT 03/05/2014 with a decrease in the size of 2 dominant right lung nodules, no new nodules, new pleural gas collection at the right apex  Cycle 12 FOLFIRI/Avastin 03/07/2014  Cycle 13 FOLFIRI/Avastin 03/20/2014  Cycle 14 FOLFIRI/Avastin 04/04/2014  Initiation of maintenance Xeloda/Avastin 04/18/2014  CT chest 07/09/2014 revealed slight enlargement of lung nodules  Treatment with single agent Avastin 07/10/2014  Resumption of FOLFIRI/Avastin on a 2 week schedule 07/25/2014  CT angio chest 09/14/2014 with essentially stable multifocal pulmonary metastatic disease.  5-fluorouracil dose reduced beginning 09/19/2014  FOLFIRI/Avastin switch to a 3 week schedule beginning with treatment 10/10/2014 secondary to malaise, diarrhea, and mucositis, irinotecan dose reduced  CT 12/26/2014 confirmed an increase in the size of bilateral lung nodules  FOLFOX/Avastin initiated on a 2 week schedule 01/02/2015  CT chest 03/20/2015 with slight enlargement of multiple lung nodules  Plan for treatment switch to [place], delayed secondary to prolonged insurance approval 2. Status post right VATS, drainage of pleural effusion, visceral and parietal pleural decortication 12/21/2012. Pleural fluid culture positive for viridans Streptococcus. Right pleura biopsies negative for malignancy. Findings favored empyema. 3. Diabetes. 4. Frequent bowel movements, diminished pelvic muscle tone-we made a referral to the pelvic physical therapy clinic 5. Pain in the left leg with tenderness at the left calf and left ischium-MRI of the lumbar spine and pelvis on 10/13/2011 with no evidence of metastatic disease. A right iliac bone "lesion" was felt to most likely represent heterogenous marrow. Edema and  enhancement was noted at the left sciatic nerve compared to the right side with no mass lesion 6. Hypertension-on HCTZ 7. Left foot fracture 8. BRIP1 mutation 9. Mucositis most likely related to 5-fluorouracil. Dose reduction of 5 fluorouracil beginning 09/19/2014.  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 She has Lonsurf approval now and the prescription is waiting for her to pick up. I will contact the study coordinator at [facility] to see how long it will be before she has a spot on the immunotherapy trial. She will not start Lonsurf if a trial spot will open up in the next few weeks.  Dr. [name] will return for an office visit in approximately one month.  Betsy Coder, MD  04/30/2015  9:07 AM

## 2015-04-30 NOTE — Telephone Encounter (Signed)
Talked with and scheduled this patient’s appointment(s) while patient was here in our office.       [facility]. °

## 2015-05-01 ENCOUNTER — Telehealth: Payer: Self-pay | Admitting: *Deleted

## 2015-05-01 LAB — TESTOSTERONE, FREE, TOTAL, SHBG
SEX HORMONE BINDING: 63.1 nmol/L (ref 17.3–125.0)
TESTOSTERONE FREE: 1.6 pg/mL (ref 0.0–4.2)
Testosterone, Serum: 31 ng/dL (ref 3–41)

## 2015-05-01 MED FILL — HYDROCHLOROTHIAZIDE 12.5 MG: 12.5 | 30 days supply | Qty: 30 | Fill #0 | Status: TO

## 2015-05-01 NOTE — Telephone Encounter (Signed)
Unable to reach pt at home or mobile #. Left message to call office. Per Dr. Benay Spice: Do not pick up [place]. She is # 7 on the list and may start on the study within the next 2-3 weeks. Has she had any vision changes? Blurred vision, diplopia or decreased visual acuity? Will need to have this evaluated at [facility] prior to beginning study.  Spoke with pt, she denies any vision changes. 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 She will not pick up Lonsurf. Understands [facility] will contact with her in the next 2-3 weeks.

## 2015-05-02 MED FILL — OXYCODONE/APAP 10/325 MG TA: 10-325 | 10 days supply | Qty: 60 | Fill #0

## 2015-05-06 MED FILL — JANUVIA 100 MG TABLET: 100 | 30 days supply | Qty: 30 | Fill #2 | Status: TO

## 2015-05-24 ENCOUNTER — Other Ambulatory Visit (HOSPITAL_BASED_OUTPATIENT_CLINIC_OR_DEPARTMENT_OTHER): Payer: Medicare Other

## 2015-05-24 ENCOUNTER — Ambulatory Visit (HOSPITAL_BASED_OUTPATIENT_CLINIC_OR_DEPARTMENT_OTHER): Payer: BLUE CROSS/BLUE SHIELD | Admitting: Oncology

## 2015-05-24 VITALS — BP 156/76 | HR 88 | Temp 98.1°F | Resp 17 | Ht 63.0 in | Wt 126.7 lb

## 2015-05-24 DIAGNOSIS — I10 Essential (primary) hypertension: Secondary | ICD-10-CM

## 2015-05-24 DIAGNOSIS — C7800 Secondary malignant neoplasm of unspecified lung: Secondary | ICD-10-CM | POA: Diagnosis not present

## 2015-05-24 DIAGNOSIS — C20 Malignant neoplasm of rectum: Secondary | ICD-10-CM | POA: Diagnosis present

## 2015-05-24 DIAGNOSIS — E119 Type 2 diabetes mellitus without complications: Secondary | ICD-10-CM

## 2015-05-24 LAB — COMPREHENSIVE METABOLIC PANEL
ALBUMIN: 4.3 g/dL (ref 3.5–5.0)
ALK PHOS: 40 U/L (ref 40–150)
ALT: 15 U/L (ref 0–55)
AST: 18 U/L (ref 5–34)
Anion Gap: 10 mEq/L (ref 3–11)
BILIRUBIN TOTAL: 0.46 mg/dL (ref 0.20–1.20)
BUN: 12.8 mg/dL (ref 7.0–26.0)
CO2: 30 meq/L — AB (ref 22–29)
CREATININE: 0.7 mg/dL (ref 0.6–1.1)
Calcium: 10.2 mg/dL (ref 8.4–10.4)
Chloride: 101 mEq/L (ref 98–109)
EGFR: 89 mL/min/{1.73_m2} — AB (ref >90)
GLUCOSE: 137 mg/dL (ref 70–140)
Potassium: 3.6 mEq/L (ref 3.5–5.1)
SODIUM: 141 meq/L (ref 136–145)
TOTAL PROTEIN: 7.4 g/dL (ref 6.4–8.3)

## 2015-05-24 LAB — CBC WITH DIFFERENTIAL/PLATELET
BASO%: 1.3 % (ref 0.0–2.0)
Basophils Absolute: 0.1 10*3/uL (ref 0.0–0.1)
EOS ABS: 0.1 10*3/uL (ref 0.0–0.5)
EOS%: 3.1 % (ref 0.0–7.0)
HCT: 40.3 % (ref 34.8–46.6)
HEMOGLOBIN: 13.2 g/dL (ref 11.6–15.9)
LYMPH%: 20.6 % (ref 14.0–49.7)
MCH: 28.7 pg (ref 25.1–34.0)
MCHC: 32.7 g/dL (ref 31.5–36.0)
MCV: 87.8 fL (ref 79.5–101.0)
MONO#: 0.3 10*3/uL (ref 0.1–0.9)
MONO%: 6.4 % (ref 0.0–14.0)
NEUT%: 68.6 % (ref 38.4–76.8)
NEUTROS ABS: 3 10*3/uL (ref 1.5–6.5)
Platelets: 230 10*3/uL (ref 145–400)
RBC: 4.59 10*6/uL (ref 3.70–5.45)
RDW: 16 % — AB (ref 11.2–14.5)
WBC: 4.4 10*3/uL (ref 3.9–10.3)
lymph#: 0.9 10*3/uL (ref 0.9–3.3)

## 2015-05-24 NOTE — Progress Notes (Signed)
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 She feels well. No dyspnea. Intermittent discomfort at the left upper anterior chest. Stable bowel function. No new complaint. She has consented to enrollment on an immunotherapy trial at [facility]. She was seen at [facility] yesterday and underwent restaging CT scans that confirmed enlargement of pulmonary nodules.  Objective:  Vital signs in last 24 hours:  Blood pressure 156/76, pulse 88, temperature 98.1 F (36.7 C), temperature source Oral, resp. rate 17, height '5\' 3"'$  (1.6 m), weight 126 lb 11.2 oz (57.471 kg), SpO2 100 %.    Resp: Decreased breath sounds at the right lower chest, no respiratory distress Cardio: Regular rate and rhythm GI: No hepatomegaly, nontender Vascular: No leg edema     Portacath/PICC-without erythema  Lab Results:  Lab Results  Component Value Date   WBC 4.4 05/24/2015   HGB 13.2 05/24/2015   HCT 40.3 05/24/2015   MCV 87.8 05/24/2015   PLT 230 05/24/2015   NEUTROABS 3.0 05/24/2015      Lab Results  Component Value Date   CEA1 53.0* 04/08/2015     Medications: I have reviewed the patient's current medications.  Assessment/Plan: 1. Metastatic rectal cancer diagnosed July 2014. MSI stable on the March 2015 resection specimen. Foundation 1 Testing confirmed a KRAS G13 mutation, APC mutation, and BRIP1 mutation.  Colonoscopy on 10/25/2012 showed a bulky circumferential friable mass lesion in the rectum at approximately 4 cm above the dentate line. The mass was subtotally obstructing and would not permit passage of the colonoscope proximal. Biopsy showed adenocarcinoma.   Staging CT scans 10/25/2012 showed a right lower lobe lung mass measuring 5 x 4.6 cm; 0.8 cm nodule right upper lobe; masslike rectosigmoid wall thickening, surrounding stranding and adjacent lymphadenopathy.   PET scan 11/02/2012 showed  hypermetabolism in the area of apparent circumferential rectal wall thickening; the 5 cm right lower lobe pulmonary mass was hypermetabolic with SUV max equal 8. No other hypermetabolic lung lesions were evident. Small bilateral pulmonary parenchymal nodules were noted.   She completed 5 cycles of FOLFOX chemotherapy 11/22/2012 through 02/13/2013.   Status post right thoracotomy, bilobectomy (right lower and middle lobectomy) with en bloc resection of the diaphragm 02/27/2013. Pathology showed adenocarcinoma consistent with metastatic colorectal adenocarcinoma, 4.3 cm, negative margins, no lymph node involvement. Negative soft tissue biopsy of the diaphragm x2. Positive for K-ras mutation-codon 13   She completed concurrent radiation and Xeloda for treatment of the rectal tumor 03/06/2013 through 04/11/2013.   Status post low anterior resection with a diverting loop ileostomy on 05/31/2013 at [facility] in [place]. Final pathology showed moderately differentiated adenocarcinoma located in the upper rectum; the tumor measured 2.5 x 2.2 cm; there was invasion through the muscularis propria into pericolic adipose tissue; tumor budding was present. Lymphatic status negative; vascular status negative; proximal and distal surgical margins negative; 2 of 17 lymph nodes showed metastatic adenocarcinoma. Mesenteric tumor deposits were present. Perineural invasion present. [facility] confirmed an additional positive [facility] node and the tumor was staged as a T4a   CT abdomen/pelvis 05/12/2013. Metastatic nodules in the lung increased. Chronic loculated right base pneumothorax. Diffuse mucosal thickening of the sigmoid portion of the colon.   Restaging CTs 07/25/2013 with enlargement of bilateral lung nodules, no other evidence of metastatic disease   Ileostomy takedown 08/14/2013   Chest CT 10/02/2013 revealed enlargement of bilateral lung nodules and increased right pleural fluid  Cycle 1 of FOLFIRI/Avastin 10/03/2013   Cycle 6 of FOLFIRI/Avastin 12/12/2013   Restaging CT 12/22/2013 with a decrease in the size of bilateral lung nodules, no evidence of disease progression   Cycle 7 FOLFIRI/Avastin 12/25/2013  Cycle 8 FOLFIRI/Avastin 01/10/2014  Cycle 9 FOLFIRI/Avastin 01/24/2014  Cycle 10 FOLFIRI/Avastin 02/07/2014  Cycle of 11 FOLFIRI/Avastin 02/21/2014  Restaging CT 03/05/2014 with a decrease in the size of 2 dominant right lung nodules, no new nodules, new pleural gas collection at the right apex  Cycle 12 FOLFIRI/Avastin 03/07/2014  Cycle 13 FOLFIRI/Avastin 03/20/2014  Cycle 14 FOLFIRI/Avastin 04/04/2014  Initiation of maintenance Xeloda/Avastin 04/18/2014  CT chest 07/09/2014 revealed slight enlargement of lung nodules  Treatment with single agent Avastin 07/10/2014  Resumption of FOLFIRI/Avastin on a 2 week schedule 07/25/2014  CT angio chest 09/14/2014 with essentially stable multifocal pulmonary metastatic disease.  5-fluorouracil dose reduced beginning 09/19/2014  FOLFIRI/Avastin switch to a 3 week schedule beginning with treatment 10/10/2014 secondary to malaise, diarrhea, and mucositis, irinotecan dose reduced  CT 12/26/2014 confirmed an increase in the size of bilateral lung nodules  FOLFOX/Avastin initiated on a 2 week schedule 01/02/2015  CT chest 03/20/2015 with slight enlargement of multiple lung nodules  Enrollment on an immunotherapy trial at [facility] March 2017 2. Status post right VATS, drainage of pleural effusion, visceral and parietal pleural decortication 12/21/2012. Pleural fluid culture positive for viridans Streptococcus. Right pleura biopsies negative for malignancy. Findings favored empyema. 3. Diabetes. 4. Frequent bowel movements, diminished pelvic muscle tone-we made a referral to the pelvic physical therapy clinic 5. Pain in the left leg with tenderness at the left calf and left ischium-MRI of the lumbar spine  and pelvis on 10/13/2011 with no evidence of metastatic disease. A right iliac bone "lesion" was felt to most likely represent heterogenous marrow. Edema and enhancement was noted at the left sciatic nerve compared to the right side with no mass lesion 6. Hypertension-on HCTZ 7. Left foot fracture 8. BRIP1 mutation 9. Mucositis most likely related to 5-fluorouracil. Dose reduction of 5 fluorouracil beginning 09/19/2014.    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 She plans to begin treatment on the immunotherapy trial within the next few weeks. She will return for an office visit here in 2 months. I am available to see her in the interim as needed.  Betsy Coder, MD  05/24/2015  10:09 AM

## 2015-05-28 ENCOUNTER — Telehealth: Payer: Self-pay | Admitting: Oncology

## 2015-05-28 NOTE — Telephone Encounter (Signed)
Spoke with patient's sister re f/u for 4/21.

## 2015-05-30 MED FILL — MINIVELLE 0.0375 MG PATCH: 0.0375 | 28 days supply | Qty: 8 | Fill #0

## 2015-06-07 ENCOUNTER — Telehealth: Payer: Self-pay | Admitting: Oncology

## 2015-06-07 NOTE — Telephone Encounter (Signed)
Returned call to patient re request for f/u with BS due to starting a new treatment at [facility]. Per Dr. Benay Spice he can see patient 3/16 @ 4 pm or 3/17 @ 8 am. Spoke with patient and she is starting treatment 3/14 @ 7 am at [facility] and will f/u with Dr. Benay Spice 3/17 @ 8 am.

## 2015-06-14 ENCOUNTER — Ambulatory Visit (HOSPITAL_BASED_OUTPATIENT_CLINIC_OR_DEPARTMENT_OTHER): Payer: Medicare Other | Admitting: Oncology

## 2015-06-14 VITALS — BP 161/81 | HR 78 | Temp 98.1°F | Resp 18 | Ht 63.0 in | Wt 125.2 lb

## 2015-06-14 DIAGNOSIS — C7800 Secondary malignant neoplasm of unspecified lung: Secondary | ICD-10-CM

## 2015-06-14 DIAGNOSIS — E119 Type 2 diabetes mellitus without complications: Secondary | ICD-10-CM | POA: Diagnosis not present

## 2015-06-14 DIAGNOSIS — I10 Essential (primary) hypertension: Secondary | ICD-10-CM | POA: Diagnosis not present

## 2015-06-14 DIAGNOSIS — C20 Malignant neoplasm of rectum: Secondary | ICD-10-CM

## 2015-06-14 MED ORDER — METHYLPHENIDATE HCL ER (OSM) 27 MG PO TBCR: 27.0000 mg | EXTENDED_RELEASE_TABLET | Freq: Every day | ORAL | Status: DC

## 2015-06-14 MED ORDER — OXYCODONE-ACETAMINOPHEN 10-325 MG PO TABS: 1.0000 | ORAL_TABLET | ORAL | Status: DC | PRN

## 2015-06-14 NOTE — Progress Notes (Signed)
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 No complaint. She began treatment on the clinical trial 06/11/2015  Objective:  Vital signs in last 24 hours:  Blood pressure 161/81, pulse 78, temperature 98.1 F (36.7 C), temperature source Oral, resp. rate 18, height '5\' 3"'$  (1.6 m), weight 125 lb 3.2 oz (56.79 kg), SpO2 100 %.    Physical examination-not performed today  Lab Results:  Lab Results  Component Value Date   WBC 4.4 05/24/2015   HGB 13.2 05/24/2015   HCT 40.3 05/24/2015   MCV 87.8 05/24/2015   PLT 230 05/24/2015   NEUTROABS 3.0 05/24/2015      Lab Results  Component Value Date   CEA1 53.0* 04/08/2015    Medications: I have reviewed the patient's current medications.  Assessment/Plan: 1. Metastatic rectal cancer diagnosed July 2014. MSI stable on the March 2015 resection specimen. Foundation 1 Testing confirmed a KRAS G13 mutation, APC mutation, and BRIP1 mutation.  Colonoscopy on 10/25/2012 showed a bulky circumferential friable mass lesion in the rectum at approximately 4 cm above the dentate line. The mass was subtotally obstructing and would not permit passage of the colonoscope proximal. Biopsy showed adenocarcinoma.   Staging CT scans 10/25/2012 showed a right lower lobe lung mass measuring 5 x 4.6 cm; 0.8 cm nodule right upper lobe; masslike rectosigmoid wall thickening, surrounding stranding and adjacent lymphadenopathy.   PET scan 11/02/2012 showed hypermetabolism in the area of apparent circumferential rectal wall thickening; the 5 cm right lower lobe pulmonary mass was hypermetabolic with SUV max equal 8. No other hypermetabolic lung lesions were evident. Small bilateral pulmonary parenchymal nodules were noted.   She completed 5 cycles of FOLFOX chemotherapy 11/22/2012 through 02/13/2013.   Status post right  thoracotomy, bilobectomy (right lower and middle lobectomy) with en bloc resection of the diaphragm 02/27/2013. Pathology showed adenocarcinoma consistent with metastatic colorectal adenocarcinoma, 4.3 cm, negative margins, no lymph node involvement. Negative soft tissue biopsy of the diaphragm x2. Positive for K-ras mutation-codon 13   She completed concurrent radiation and Xeloda for treatment of the rectal tumor 03/06/2013 through 04/11/2013.   Status post low anterior resection with a diverting loop ileostomy on 05/31/2013 at [facility] in [place]. Final pathology showed moderately differentiated adenocarcinoma located in the upper rectum; the tumor measured 2.5 x 2.2 cm; there was invasion through the muscularis propria into pericolic adipose tissue; tumor budding was present. Lymphatic status negative; vascular status negative; proximal and distal surgical margins negative; 2 of 17 lymph nodes showed metastatic adenocarcinoma. Mesenteric tumor deposits were present. Perineural invasion present. [facility] an additional positive [facility] node and the tumor was staged as a T4a   CT abdomen/pelvis 05/12/2013. Metastatic nodules in the lung increased. Chronic loculated right base pneumothorax. Diffuse mucosal thickening of the sigmoid portion of the colon.   Restaging CTs 07/25/2013 with enlargement of bilateral lung nodules, no other evidence of metastatic disease   Ileostomy takedown 08/14/2013   Chest CT 10/02/2013 revealed enlargement of bilateral lung nodules and increased right pleural fluid   Cycle 1 of FOLFIRI/Avastin 10/03/2013   Cycle 6 of FOLFIRI/Avastin 12/12/2013   Restaging CT 12/22/2013 with a decrease in the size of bilateral lung nodules, no evidence of disease progression   Cycle 7 FOLFIRI/Avastin 12/25/2013  Cycle 8 FOLFIRI/Avastin 01/10/2014  Cycle 9 FOLFIRI/Avastin 01/24/2014  Cycle 10 FOLFIRI/Avastin 02/07/2014  Cycle of 11  FOLFIRI/Avastin 02/21/2014  Restaging CT 03/05/2014 with a decrease in the size of 2 dominant right lung nodules, no new nodules, new pleural gas collection at the right apex  Cycle 12 FOLFIRI/Avastin 03/07/2014  Cycle 13 FOLFIRI/Avastin 03/20/2014  Cycle 14 FOLFIRI/Avastin 04/04/2014  Initiation of maintenance Xeloda/Avastin 04/18/2014  CT chest 07/09/2014 revealed slight enlargement of lung nodules  Treatment with single agent Avastin 07/10/2014  Resumption of FOLFIRI/Avastin on a 2 week schedule 07/25/2014  CT angio chest 09/14/2014 with essentially stable multifocal pulmonary metastatic disease.  5-fluorouracil dose reduced beginning 09/19/2014  FOLFIRI/Avastin switch to a 3 week schedule beginning with treatment 10/10/2014 secondary to malaise, diarrhea, and mucositis, irinotecan dose reduced  CT 12/26/2014 confirmed an increase in the size of bilateral lung nodules  FOLFOX/Avastin initiated on a 2 week schedule 01/02/2015  CT chest 03/20/2015 with slight enlargement of multiple lung nodules  Enrollment on an immunotherapy trial at [facility] March 2017-treatment began with Nivolumab, Ipilumumab, and cobimetinib 06/11/2015 2. Status post right VATS, drainage of pleural effusion, visceral and parietal pleural decortication 12/21/2012. Pleural fluid culture positive for viridans Streptococcus. Right pleura biopsies negative for malignancy. Findings favored empyema. 3. Diabetes. 4. Frequent bowel movements, diminished pelvic muscle tone-we made a referral to the pelvic physical therapy clinic 5. Pain in the left leg with tenderness at the left calf and left ischium-MRI of the lumbar spine and pelvis on 10/13/2011 with no evidence of metastatic disease. A right iliac bone "lesion" was felt to most likely represent heterogenous marrow. Edema and enhancement was noted at the left sciatic nerve compared to the right side with no mass lesion 6. Hypertension-on HCTZ 7. Left foot  fracture 8. BRIP1 mutation 9. Mucositis most likely related to 5-fluorouracil. Dose reduction of 5 fluorouracil beginning 09/19/2014.  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 She began treatment on the immunotherapy trial at [facility] 06/11/2015. She will be followed closely at [facility] while on study. She will return for an office visit here 07/19/2015. I am available to see her sooner as needed.  Betsy Coder, MD  06/14/2015  4:13 PM

## 2015-07-11 IMAGING — PT NM PET TUM IMG INITIAL (PI) SKULL BASE T - THIGH
5 series · 25 of 25 positions shown · non-contrast
Comparison: CT scan from 10/25/2012

CLINICAL DATA: Initial treatment strategy for rectal cancer.
Circumferential rectal cancer 4 cm from the anal verge.  4 cm right
lung mass.

NUCLEAR MEDICINE PET WHOLE BODY
Fasting Blood Glucose:  123
TECHNIQUE: 19.1 mCi F-18 FDG was injected intravenously. CT data
was obtained and used for attenuation correction and anatomic
localization only.  (This was not acquired as a diagnostic CT
examination.) Additional exam technical data entered on
technologist worksheet.

[Series 1: pet ac · axial · 3.3mm · 4.69mm/px · z∈[-880,-10]mm · 6 of 267 slices shown]
[im 1/267]
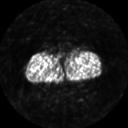
[im 54/267]
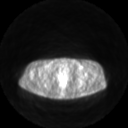
[im 107/267]
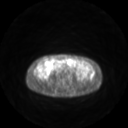
[im 160/267]
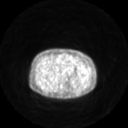
[im 213/267]
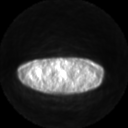
[im 267/267]
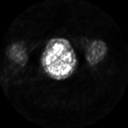

[Series 2: pet nac · axial · 3.3mm · 4.69mm/px · z∈[-880,-10]mm · 6 of 267 slices shown]
[im 1/267]
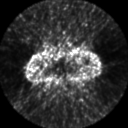
[im 54/267]
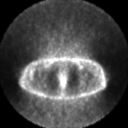
[im 107/267]
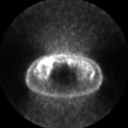
[im 160/267]
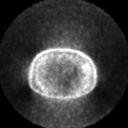
[im 213/267]
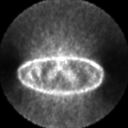
[im 267/267]
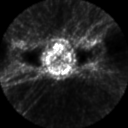

[Series 2: ct images · axial · 3.8mm · 0.98mm/px · z∈[-880,-10]mm · 6 of 267 slices shown]
[im 1/267]
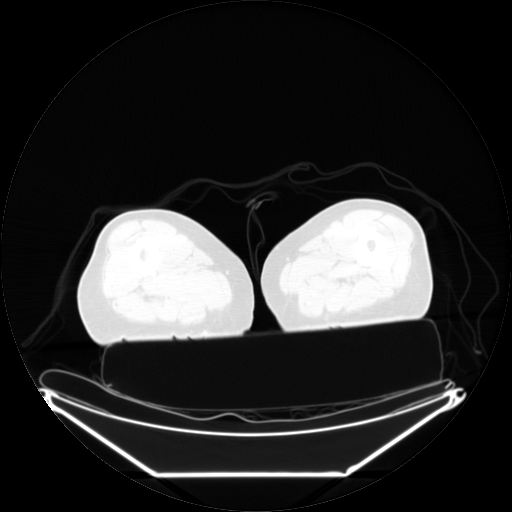
[im 54/267]
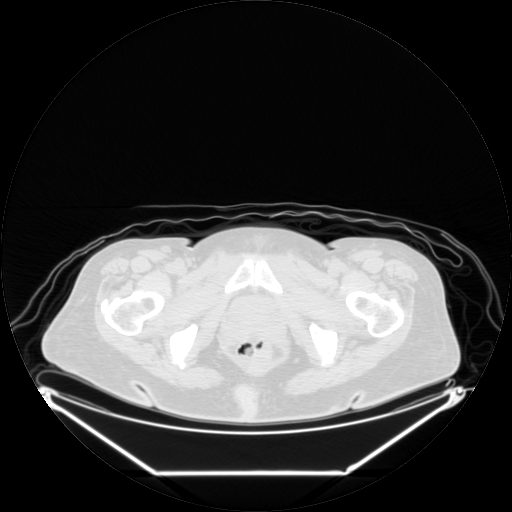
[im 107/267]
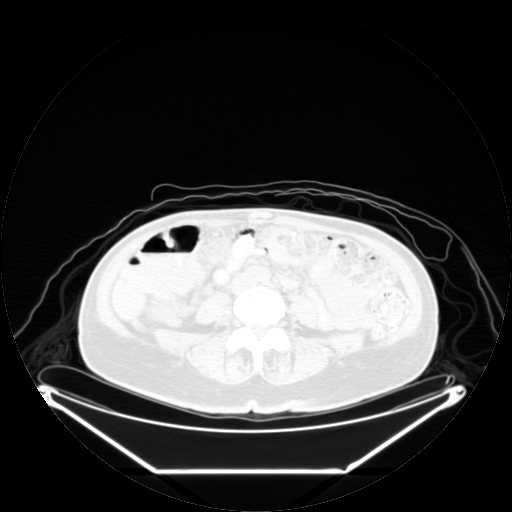
[im 160/267]
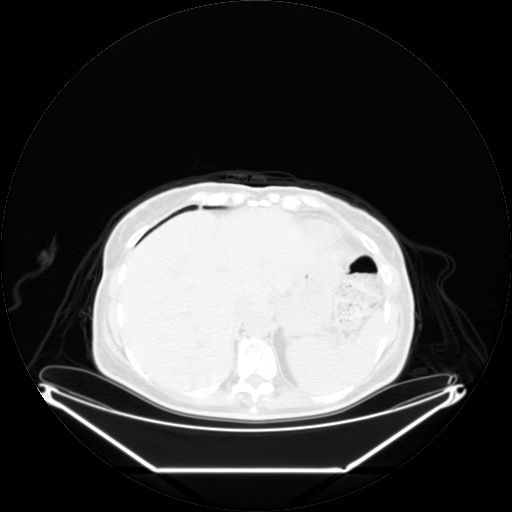
[im 213/267]
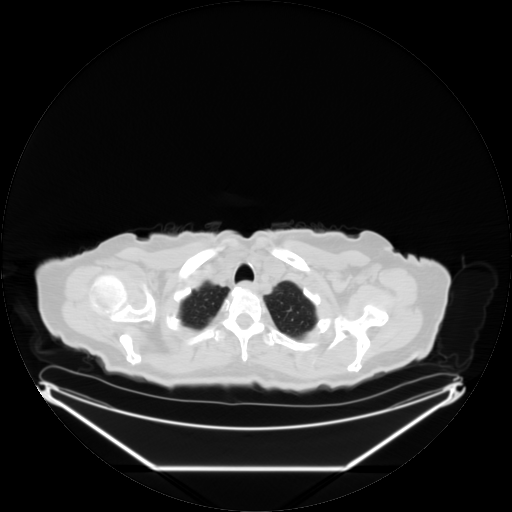
[im 267/267  brain]
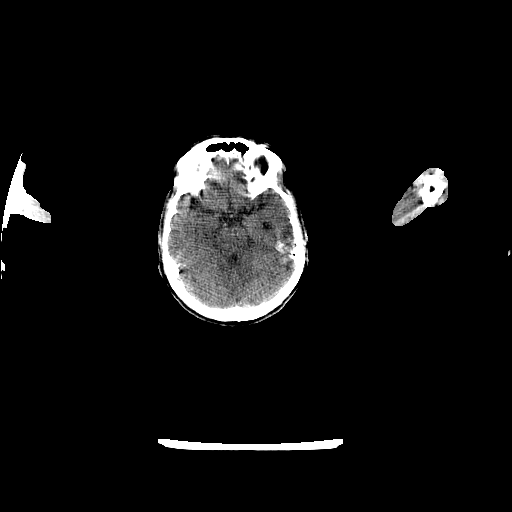

[Series 151: reformatted · axial · 3.3mm · 3.91mm/px · z∈[-880,-10]mm · 6 of 267 slices shown (1 of 2)]
[im 1/267]
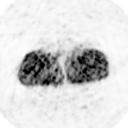
[im 54/267]
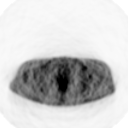
[im 107/267]
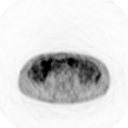
[im 160/267]
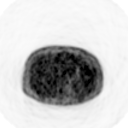
[im 213/267]
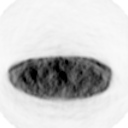
[im 267/267]
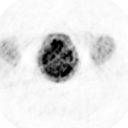

[Series 153: reformatted · coronal · 4.7mm · 6.98mm/px · 1 of 66 slices shown (2 of 2)]
[im 1/66]
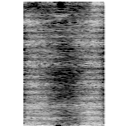

[25 of 25 positions shown; findings below may reference images not displayed]

FINDINGS: Head/Neck:   No hypermetabolic lymph nodes in the neck.

Chest:  The previously characterized 5 cm right lower lobe
pulmonary mass is hypermetabolic with SUV max = 8.  No evidence for
hypermetabolic lymphadenopathy in the mediastinum or either hilum.
No other hypermetabolic lung lesions are evident although the
patient does have bilateral small pulmonary parenchymal nodules
(see posterior right upper lobe on image 73 and posterior right
lower lobe on image 78).  Both of these small nodules are below the
accepted size threshold for reliable resolution on PET imaging.

Abdomen/Pelvis:  No abnormal hypermetabolism is identified within
the liver.  No hypermetabolic lymphadenopathy in the abdomen.

There is a prominent amount of physiologic F D G uptake in the
colon, but this is associated with hypermetabolic uptake in a
region of circumferential rectal wall thickening.  SUV max = 9 for
the rectal hypermetabolism.

Skeleton:  No focal hypermetabolic activity to suggest skeletal
metastasis.
IMPRESSION: Hypermetabolism in the area of apparent circumferential rectal wall
thickening associate with hypermetabolic 5 cm right lower lobe
pulmonary mass.

6 mm cavitary pulmonary nodule the posterior right upper lobe is
not hypermetabolic on PET imaging, but is below the accepted size
threshold for reliable resolution on PET.  A second tiny right
lower lobe nodule is also noted.

## 2015-07-19 ENCOUNTER — Ambulatory Visit (HOSPITAL_BASED_OUTPATIENT_CLINIC_OR_DEPARTMENT_OTHER): Payer: Medicare Other | Admitting: Oncology

## 2015-07-19 ENCOUNTER — Telehealth: Payer: Self-pay | Admitting: Oncology

## 2015-07-19 VITALS — BP 149/77 | HR 99 | Temp 98.4°F | Resp 18 | Ht 63.0 in | Wt 125.6 lb

## 2015-07-19 DIAGNOSIS — C7802 Secondary malignant neoplasm of left lung: Secondary | ICD-10-CM | POA: Diagnosis not present

## 2015-07-19 DIAGNOSIS — C7801 Secondary malignant neoplasm of right lung: Secondary | ICD-10-CM | POA: Diagnosis not present

## 2015-07-19 DIAGNOSIS — C20 Malignant neoplasm of rectum: Secondary | ICD-10-CM

## 2015-07-19 DIAGNOSIS — E119 Type 2 diabetes mellitus without complications: Secondary | ICD-10-CM

## 2015-07-19 MED ORDER — OXYCODONE-ACETAMINOPHEN 10-325 MG PO TABS: 1.0000 | ORAL_TABLET | ORAL | Status: DC | PRN

## 2015-07-19 MED FILL — ESTRADIOL PATCH 0.0375: 0.0375 | 28 days supply | Qty: 8 | Fill #0 | Status: TO

## 2015-07-19 NOTE — Telephone Encounter (Signed)
Gave and printed appt sched and avs for pt for may °

## 2015-07-19 NOTE — Progress Notes (Signed)
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 She continues treatment on study at [facility] with nivolumab and ipilumumab. She has not started Cobimetinib. She has developed a rash over the trunk and extremities. The rash is not pruritic. No diarrhea. Mild exertional dyspnea. She has malaise. No other complaint. 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  Objective:  Vital signs in last 24 hours:  Blood pressure 149/77, pulse 99, temperature 98.4 F (36.9 C), temperature source Oral, resp. rate 18, height '5\' 3"'$  (1.6 m), weight 125 lb 9.6 oz (56.972 kg), SpO2 98 %.    HEENT: No thrush or ulcers Resp: Lungs with decreased breath sounds right lower chest, no respiratory distress Cardio: Regular rate and rhythm GI: No hepatomegaly, no mass, nontender Vascular: No leg edema  Skin: Multiple erythematous lesions over the arms, some are pustular, few erythematous lesions over the trunk. Bandage is in place at biopsy sites over the lower legs.   Portacath/PICC-without erythema   Medications: I have reviewed the patient's current medications.  Assessment/Plan: 1. Metastatic rectal cancer diagnosed July 2014. MSI stable on the March 2015 resection specimen. Foundation 1 Testing confirmed a KRAS G13 mutation, APC mutation, and BRIP1 mutation.  Colonoscopy on 10/25/2012 showed a bulky circumferential friable mass lesion in the rectum at approximately 4 cm above the dentate line. The mass was subtotally obstructing and would not permit passage of the colonoscope proximal. Biopsy showed adenocarcinoma.   Staging CT scans 10/25/2012 showed a right lower lobe lung mass measuring 5 x 4.6 cm; 0.8 cm nodule right upper lobe; masslike rectosigmoid wall thickening, surrounding stranding and adjacent lymphadenopathy.   PET scan 11/02/2012 showed hypermetabolism in the  area of apparent circumferential rectal wall thickening; the 5 cm right lower lobe pulmonary mass was hypermetabolic with SUV max equal 8. No other hypermetabolic lung lesions were evident. Small bilateral pulmonary parenchymal nodules were noted.   She completed 5 cycles of FOLFOX chemotherapy 11/22/2012 through 02/13/2013.   Status post right thoracotomy, bilobectomy (right lower and middle lobectomy) with en bloc resection of the diaphragm 02/27/2013. Pathology showed adenocarcinoma consistent with metastatic colorectal adenocarcinoma, 4.3 cm, negative margins, no lymph node involvement. Negative soft tissue biopsy of the diaphragm x2. Positive for K-ras mutation-codon 13   She completed concurrent radiation and Xeloda for treatment of the rectal tumor 03/06/2013 through 04/11/2013.   Status post low anterior resection with a diverting loop ileostomy on 05/31/2013 at [facility] in [name]. Final pathology showed moderately differentiated adenocarcinoma located in the upper rectum; the tumor measured 2.5 x 2.2 cm; there was invasion through the muscularis propria into pericolic adipose tissue; tumor budding was present. Lymphatic status negative; vascular status negative; proximal and distal surgical margins negative; 2 of 17 lymph nodes showed metastatic adenocarcinoma. Mesenteric tumor deposits were present. Perineural invasion present. [facility] confirmed an additional positive lymph node and the tumor was staged as a T4a   CT abdomen/pelvis 05/12/2013. Metastatic nodules in the lung increased. Chronic loculated right base pneumothorax. Diffuse mucosal thickening of the sigmoid portion of the colon.   Restaging CTs 07/25/2013 with enlargement of bilateral lung nodules, no other evidence of metastatic disease   Ileostomy takedown 08/14/2013   Chest CT 10/02/2013 revealed enlargement of bilateral lung nodules and increased right pleural fluid   Cycle 1 of  FOLFIRI/Avastin 10/03/2013   Cycle 6 of FOLFIRI/Avastin  12/12/2013   Restaging CT 12/22/2013 with a decrease in the size of bilateral lung nodules, no evidence of disease progression   Cycle 7 FOLFIRI/Avastin 12/25/2013  Cycle 8 FOLFIRI/Avastin 01/10/2014  Cycle 9 FOLFIRI/Avastin 01/24/2014  Cycle 10 FOLFIRI/Avastin 02/07/2014  Cycle of 11 FOLFIRI/Avastin 02/21/2014  Restaging CT 03/05/2014 with a decrease in the size of 2 dominant right lung nodules, no new nodules, new pleural gas collection at the right apex  Cycle 12 FOLFIRI/Avastin 03/07/2014  Cycle 13 FOLFIRI/Avastin 03/20/2014  Cycle 14 FOLFIRI/Avastin 04/04/2014  Initiation of maintenance Xeloda/Avastin 04/18/2014  CT chest 07/09/2014 revealed slight enlargement of lung nodules  Treatment with single agent Avastin 07/10/2014  Resumption of FOLFIRI/Avastin on a 2 week schedule 07/25/2014  CT angio chest 09/14/2014 with essentially stable multifocal pulmonary metastatic disease.  5-fluorouracil dose reduced beginning 09/19/2014  FOLFIRI/Avastin switch to a 3 week schedule beginning with treatment 10/10/2014 secondary to malaise, diarrhea, and mucositis, irinotecan dose reduced  CT 12/26/2014 confirmed an increase in the size of bilateral lung nodules  FOLFOX/Avastin initiated on a 2 week schedule 01/02/2015  CT chest 03/20/2015 with slight enlargement of multiple lung nodules  Enrollment on an immunotherapy trial at [facility] March 2017-treatment began with Nivolumab, Ipilumumab, and cobimetinib 06/11/2015, cobimetinib on hold 2. Status post right VATS, drainage of pleural effusion, visceral and parietal pleural decortication 12/21/2012. Pleural fluid culture positive for viridans Streptococcus. Right pleura biopsies negative for malignancy. Findings favored empyema. 3. Diabetes. 4. Frequent bowel movements, diminished pelvic muscle tone-we made a referral to the pelvic physical therapy clinic 5. Pain in the left  leg with tenderness at the left calf and left ischium-MRI of the lumbar spine and pelvis on 10/13/2011 with no evidence of metastatic disease. A right iliac bone "lesion" was felt to most likely represent heterogenous marrow. Edema and enhancement was noted at the left sciatic nerve compared to the right side with no mass lesion 6. Hypertension-on HCTZ 7. Left foot fracture 8. BRIP1 mutation 9. Mucositis most likely related to 5-fluorouracil. Dose reduction of 5 fluorouracil beginning 09/19/2014. 10. 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 She is followed closely at [facility] while on study. She will return for an office visit here 08/21/2015. I am available to see her sooner as needed.  Betsy Coder, MD  07/19/2015  12:37 PM

## 2015-07-26 ENCOUNTER — Other Ambulatory Visit: Payer: Self-pay | Admitting: Internal Medicine

## 2015-07-26 NOTE — Telephone Encounter (Signed)
Sent in

## 2015-07-30 ENCOUNTER — Encounter: Payer: Medicare Other | Admitting: Thoracic Surgery (Cardiothoracic Vascular Surgery)

## 2015-07-30 ENCOUNTER — Other Ambulatory Visit: Payer: Self-pay | Admitting: Oncology

## 2015-08-07 ENCOUNTER — Encounter: Payer: Self-pay | Admitting: Internal Medicine

## 2015-08-08 ENCOUNTER — Other Ambulatory Visit: Payer: Self-pay | Admitting: Internal Medicine

## 2015-08-21 ENCOUNTER — Ambulatory Visit: Payer: Medicare Other | Admitting: Oncology

## 2015-08-23 ENCOUNTER — Telehealth: Payer: Self-pay | Admitting: Oncology

## 2015-08-23 ENCOUNTER — Other Ambulatory Visit: Payer: Self-pay | Admitting: *Deleted

## 2015-08-23 NOTE — Telephone Encounter (Signed)
left msg confirming 6/1 apt

## 2015-08-29 ENCOUNTER — Other Ambulatory Visit: Payer: Self-pay | Admitting: *Deleted

## 2015-08-29 ENCOUNTER — Ambulatory Visit (HOSPITAL_BASED_OUTPATIENT_CLINIC_OR_DEPARTMENT_OTHER): Payer: Medicare Other | Admitting: Oncology

## 2015-08-29 ENCOUNTER — Telehealth: Payer: Self-pay | Admitting: Oncology

## 2015-08-29 VITALS — BP 164/77 | HR 102 | Temp 98.1°F | Resp 18 | Ht 63.0 in | Wt 123.9 lb

## 2015-08-29 DIAGNOSIS — C7801 Secondary malignant neoplasm of right lung: Secondary | ICD-10-CM | POA: Diagnosis not present

## 2015-08-29 DIAGNOSIS — C7650 Malignant neoplasm of unspecified lower limb: Secondary | ICD-10-CM | POA: Diagnosis not present

## 2015-08-29 DIAGNOSIS — R2242 Localized swelling, mass and lump, left lower limb: Secondary | ICD-10-CM

## 2015-08-29 DIAGNOSIS — C7802 Secondary malignant neoplasm of left lung: Secondary | ICD-10-CM | POA: Diagnosis not present

## 2015-08-29 DIAGNOSIS — C20 Malignant neoplasm of rectum: Secondary | ICD-10-CM

## 2015-08-29 DIAGNOSIS — C7800 Secondary malignant neoplasm of unspecified lung: Principal | ICD-10-CM

## 2015-08-29 MED ORDER — OXYCODONE-ACETAMINOPHEN 10-325 MG PO TABS: 1.0000 | ORAL_TABLET | ORAL | Status: DC | PRN

## 2015-08-29 NOTE — Progress Notes (Signed)
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 She continues treatment on study at [facility]. Treatment was placed on hold last week secondary to a diffuse rash. The rash has improved. She reports the rash was painful. No difficulty with bowel function. No dyspnea. She has developed a soft fullness at the site of a squamous cell carcinoma removal at the left lower leg. She is scheduled to see Dr. Ubaldo Glassing later this morning.  Objective:  Vital signs in last 24 hours:  Blood pressure 164/77, pulse 102, temperature 98.1 F (36.7 C), temperature source Oral, resp. rate 18, height '5\' 3"'$  (1.6 m), weight 123 lb 14.4 oz (56.201 kg), SpO2 97 %.    HEENT: Healing ulcers at the lower inner lip, no thrush Resp: Decreased breath sounds at the right lower chest, no respiratory distress Cardio: Regular rate and rhythm GI: No hepatomegaly, nontender Vascular: The left lower leg is slightly larger than the right side  Skin: Fading erythematous rash over the trunk, raised nodular lesion at the left upper lateral calf, 3 cm soft mobile fullness overlying the left lower tibia.   Portacath/PICC-without erythema   Medications: I have reviewed the patient's current medications.  Assessment/Plan: 1. Metastatic rectal cancer diagnosed July 2014. MSI stable on the March 2015 resection specimen. Foundation 1 Testing confirmed a KRAS G13 mutation, APC mutation, and BRIP1 mutation.  Colonoscopy on 10/25/2012 showed a bulky circumferential friable mass lesion in the rectum at approximately 4 cm above the dentate line. The mass was subtotally obstructing and would not permit passage of the colonoscope proximal. Biopsy showed adenocarcinoma.   Staging CT scans 10/25/2012 showed a right lower lobe lung mass measuring 5 x 4.6 cm; 0.8 cm nodule right upper lobe; masslike rectosigmoid wall thickening, surrounding stranding and adjacent  lymphadenopathy.   PET scan 11/02/2012 showed hypermetabolism in the area of apparent circumferential rectal wall thickening; the 5 cm right lower lobe pulmonary mass was hypermetabolic with SUV max equal 8. No other hypermetabolic lung lesions were evident. Small bilateral pulmonary parenchymal nodules were noted.   She completed 5 cycles of FOLFOX chemotherapy 11/22/2012 through 02/13/2013.   Status post right thoracotomy, bilobectomy (right lower and middle lobectomy) with en bloc resection of the diaphragm 02/27/2013. Pathology showed adenocarcinoma consistent with metastatic colorectal adenocarcinoma, 4.3 cm, negative margins, no lymph node involvement. Negative soft tissue biopsy of the diaphragm x2. Positive for K-ras mutation-codon 13   She completed concurrent radiation and Xeloda for treatment of the rectal tumor 03/06/2013 through 04/11/2013.   Status post low anterior resection with a diverting loop ileostomy on 05/31/2013 at [facility] in [place]. Final pathology showed moderately differentiated adenocarcinoma located in the upper rectum; the tumor measured 2.5 x 2.2 cm; there was invasion through the muscularis propria into pericolic adipose tissue; tumor budding was present. Lymphatic status negative; vascular status negative; proximal and distal surgical margins negative; 2 of 17 lymph nodes showed metastatic adenocarcinoma. Mesenteric tumor deposits were present. Perineural invasion present. [facility] confirmed an additional positive lymph node and the tumor was staged as a T4a   CT abdomen/pelvis 05/12/2013. Metastatic nodules in the lung increased. Chronic loculated right base pneumothorax. Diffuse mucosal thickening of the sigmoid portion of the colon.   Restaging CTs 07/25/2013 with enlargement of bilateral lung nodules, no other evidence of metastatic disease   Ileostomy takedown 08/14/2013   Chest CT 10/02/2013 revealed enlargement of bilateral lung  nodules  and increased right pleural fluid   Cycle 1 of FOLFIRI/Avastin 10/03/2013   Cycle 6 of FOLFIRI/Avastin 12/12/2013   Restaging CT 12/22/2013 with a decrease in the size of bilateral lung nodules, no evidence of disease progression   Cycle 7 FOLFIRI/Avastin 12/25/2013  Cycle 8 FOLFIRI/Avastin 01/10/2014  Cycle 9 FOLFIRI/Avastin 01/24/2014  Cycle 10 FOLFIRI/Avastin 02/07/2014  Cycle of 11 FOLFIRI/Avastin 02/21/2014  Restaging CT 03/05/2014 with a decrease in the size of 2 dominant right lung nodules, no new nodules, new pleural gas collection at the right apex  Cycle 12 FOLFIRI/Avastin 03/07/2014  Cycle 13 FOLFIRI/Avastin 03/20/2014  Cycle 14 FOLFIRI/Avastin 04/04/2014  Initiation of maintenance Xeloda/Avastin 04/18/2014  CT chest 07/09/2014 revealed slight enlargement of lung nodules  Treatment with single agent Avastin 07/10/2014  Resumption of FOLFIRI/Avastin on a 2 week schedule 07/25/2014  CT angio chest 09/14/2014 with essentially stable multifocal pulmonary metastatic disease.  5-fluorouracil dose reduced beginning 09/19/2014  FOLFIRI/Avastin switch to a 3 week schedule beginning with treatment 10/10/2014 secondary to malaise, diarrhea, and mucositis, irinotecan dose reduced  CT 12/26/2014 confirmed an increase in the size of bilateral lung nodules  FOLFOX/Avastin initiated on a 2 week schedule 01/02/2015  CT chest 03/20/2015 with slight enlargement of multiple lung nodules  Enrollment on an immunotherapy trial at [facility] March 2017-treatment began with Nivolumab, Ipilumumab, and cobimetinib 06/11/2015, cobimetinib on hold 2. Status post right VATS, drainage of pleural effusion, visceral and parietal pleural decortication 12/21/2012. Pleural fluid culture positive for viridans Streptococcus. Right pleura biopsies negative for malignancy. Findings favored empyema. 3. Diabetes. 4. Frequent bowel movements, diminished pelvic muscle tone-we made a referral to  the pelvic physical therapy clinic 5. Pain in the left leg with tenderness at the left calf and left ischium-MRI of the lumbar spine and pelvis on 10/13/2011 with no evidence of metastatic disease. A right iliac bone "lesion" was felt to most likely represent heterogenous marrow. Edema and enhancement was noted at the left sciatic nerve compared to the right side with no mass lesion 6. Hypertension-on HCTZ 7. Left foot fracture 8. BRIP1 mutation 9. Mucositis most likely related to 5-fluorouracil. Dose reduction of 5 fluorouracil beginning 09/19/2014. 10. Erythematous rash-nivolumab,ipilumumab, or cobimetinib 11. 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 Treatment was placed on hold last week's secondary to a rash. She is scheduled for a follow-up visit 09/03/2015 to decide on resuming therapy. There is no clinical evidence for progression of the rectal cancer.  She has been diagnosed with multiple squamous cell carcinomas over the lower extremity. She has a suspicious nodular lesion at the lateral left lower leg and a soft nodular fullness at the site of her previous biopsy. I am concerned she is developing rapid progression of squamous cell carcinomas. She is seeing Dr. Ubaldo Glassing this morning. I will contact Dr. Dennison Nancy as this may be related to immunosuppression.  Dr. [name] will return for an office visit here in 6 weeks. I am available to see her sooner as needed.  Betsy Coder, MD  08/29/2015  9:11 AM

## 2015-08-29 NOTE — Addendum Note (Signed)
Addended by: San Morelle on: 08/29/2015 10:17 AM   Modules accepted: Medications

## 2015-08-29 NOTE — Telephone Encounter (Signed)
Gave pt apt & avs pt wanted am apt used 7/11 instead of 7/12

## 2015-09-06 ENCOUNTER — Other Ambulatory Visit: Payer: Self-pay | Admitting: Internal Medicine

## 2015-09-09 NOTE — Telephone Encounter (Signed)
Sent to the pharmacy by e-scribe.  Message sent to Dr. Benay Spice to have A1C added to next lab work.

## 2015-09-09 NOTE — Telephone Encounter (Signed)
  Please arrange for her oncology team , Dr Kym Groom all to see if  can add on a HG a1c to her next lab draw,  Refill januvia for 90 days

## 2015-09-10 ENCOUNTER — Telehealth: Payer: Self-pay | Admitting: Family Medicine

## 2015-09-10 NOTE — Telephone Encounter (Signed)
Left a message for a return call.

## 2015-09-10 NOTE — Telephone Encounter (Signed)
-----   Message from Burnis Medin, MD sent at 09/10/2015 12:55 PM EDT ----- Contact patient and see if she can ask for this  When she goes to [facility] . Thanks WP ----- Message -----    From: Hulda Humphrey, CMA    Sent: 09/09/2015   2:02 PM      To: Burnis Medin, MD    ----- Message -----    From: Ladell Pier, MD    Sent: 09/09/2015   1:57 PM      To: Hulda Humphrey, CMA  Her labs are being checked at [facility] with a clinical trial. She is not being seen here frequently ----- Message -----    From: Hulda Humphrey, CMA    Sent: 09/09/2015  11:18 AM      To: Ladell Pier, MD  Dr. Regis Bill would like to have an A1C added on to next set of labs.

## 2015-09-25 ENCOUNTER — Telehealth: Payer: Self-pay | Admitting: Oncology

## 2015-09-25 NOTE — Telephone Encounter (Signed)
Spoke to the pt.  She will ask [facility] if they will order A1C

## 2015-09-25 NOTE — Telephone Encounter (Signed)
cld & spoke to sister and adv of time & date of r/s appt for 7/12'@1'$ 

## 2015-10-04 ENCOUNTER — Telehealth: Payer: Self-pay | Admitting: *Deleted

## 2015-10-04 ENCOUNTER — Telehealth: Payer: Self-pay | Admitting: Internal Medicine

## 2015-10-04 NOTE — Telephone Encounter (Signed)
Pt is going to [facility] on 10-08-15 and needs a order to have a1c drawn. Please fax to attn tonya [phone]

## 2015-10-04 NOTE — Telephone Encounter (Signed)
Received fax from after hours call service: Pt is requesting labs at next visit. Called pt's home she was unavailable. Sister [name] reported Dr. Velora Mediate office is requesting labs, she will ask that they fax order to this office.

## 2015-10-06 ENCOUNTER — Other Ambulatory Visit: Payer: Self-pay | Admitting: Oncology

## 2015-10-07 ENCOUNTER — Other Ambulatory Visit: Payer: Self-pay | Admitting: *Deleted

## 2015-10-07 ENCOUNTER — Telehealth: Payer: Self-pay | Admitting: Oncology

## 2015-10-07 DIAGNOSIS — E119 Type 2 diabetes mellitus without complications: Secondary | ICD-10-CM

## 2015-10-07 NOTE — Telephone Encounter (Signed)
Received fax from Dr. Regis Bill requesting Hgb A1c. OK, per Dr. Benay Spice. Orders entered, will CC results to PCP.

## 2015-10-07 NOTE — Telephone Encounter (Signed)
per pof to sch pt appt-pt aware of appt-no call made

## 2015-10-07 NOTE — Telephone Encounter (Signed)
Order sent to [facility] by fax.

## 2015-10-08 ENCOUNTER — Telehealth: Payer: Self-pay | Admitting: Oncology

## 2015-10-08 ENCOUNTER — Other Ambulatory Visit: Payer: Medicare Other

## 2015-10-08 ENCOUNTER — Ambulatory Visit: Payer: Medicare Other | Admitting: Oncology

## 2015-10-08 ENCOUNTER — Ambulatory Visit (HOSPITAL_BASED_OUTPATIENT_CLINIC_OR_DEPARTMENT_OTHER): Payer: Medicare Other | Admitting: Oncology

## 2015-10-08 VITALS — BP 138/63 | HR 77 | Temp 98.0°F | Resp 18 | Ht 63.0 in | Wt 121.2 lb

## 2015-10-08 DIAGNOSIS — C20 Malignant neoplasm of rectum: Secondary | ICD-10-CM

## 2015-10-08 DIAGNOSIS — C7802 Secondary malignant neoplasm of left lung: Secondary | ICD-10-CM | POA: Diagnosis not present

## 2015-10-08 DIAGNOSIS — R21 Rash and other nonspecific skin eruption: Secondary | ICD-10-CM

## 2015-10-08 DIAGNOSIS — C7801 Secondary malignant neoplasm of right lung: Secondary | ICD-10-CM | POA: Diagnosis not present

## 2015-10-08 DIAGNOSIS — C768 Malignant neoplasm of other specified ill-defined sites: Secondary | ICD-10-CM | POA: Diagnosis not present

## 2015-10-08 DIAGNOSIS — E119 Type 2 diabetes mellitus without complications: Secondary | ICD-10-CM

## 2015-10-08 MED ORDER — METHYLPHENIDATE HCL ER (OSM) 27 MG PO TBCR: 27.0000 mg | EXTENDED_RELEASE_TABLET | Freq: Every day | ORAL | Status: DC

## 2015-10-08 MED ORDER — OXYCODONE-ACETAMINOPHEN 10-325 MG PO TABS: 1.0000 | ORAL_TABLET | ORAL | Status: DC | PRN

## 2015-10-08 NOTE — Progress Notes (Signed)
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 Treatment on the [facility] study remains on hold secondary to a diffuse skin rash. The rash is improving with a steroid taper. She otherwise feels well. She is exercising. She has noted increased swelling at the lower legs and feet after increasing her walking. She plans to leave for a [employer] later this week.  Objective:  Vital signs in last 24 hours:  Blood pressure 138/63, pulse 77, temperature 98 F (36.7 C), temperature source Oral, resp. rate 18, height '5\' 3"'$  (1.6 m), weight 121 lb 3.2 oz (54.976 kg), SpO2 100 %.    HEENT: No thrush or ulcers Resp: Slight decrease in breath sounds at the right lower chest, no respiratory distress Cardio: Regular rate and rhythm GI: No hepatomegaly, nontender Vascular: Trace pitting edema at the left greater than right lower leg and ankle  Skin: Few fading erythematous rash over the trunk and extremities. Biopsy sites at the left leg without evidence of her current tumor   Portacath/PICC-without erythema  Lab Results:  Lab Results  Component Value Date   WBC 4.4 05/24/2015   HGB 13.2 05/24/2015   HCT 40.3 05/24/2015   MCV 87.8 05/24/2015   PLT 230 05/24/2015   NEUTROABS 3.0 05/24/2015    Lab Results  Component Value Date   CEA1 53.0* 04/08/2015    Medications: I have reviewed the patient's current medications.  Assessment/Plan: 1. Metastatic rectal cancer diagnosed July 2014. MSI stable on the March 2015 resection specimen. Foundation 1 Testing confirmed a KRAS G13 mutation, APC mutation, and BRIP1 mutation.  Colonoscopy on 10/25/2012 showed a bulky circumferential friable mass lesion in the rectum at approximately 4 cm above the dentate line. The mass was subtotally obstructing and would not permit passage of the colonoscope proximal. Biopsy showed adenocarcinoma.   Staging CT  scans 10/25/2012 showed a right lower lobe lung mass measuring 5 x 4.6 cm; 0.8 cm nodule right upper lobe; masslike rectosigmoid wall thickening, surrounding stranding and adjacent lymphadenopathy.   PET scan 11/02/2012 showed hypermetabolism in the area of apparent circumferential rectal wall thickening; the 5 cm right lower lobe pulmonary mass was hypermetabolic with SUV max equal 8. No other hypermetabolic lung lesions were evident. Small bilateral pulmonary parenchymal nodules were noted.   She completed 5 cycles of FOLFOX chemotherapy 11/22/2012 through 02/13/2013.   Status post right thoracotomy, bilobectomy (right lower and middle lobectomy) with en bloc resection of the diaphragm 02/27/2013. Pathology showed adenocarcinoma consistent with metastatic colorectal adenocarcinoma, 4.3 cm, negative margins, no lymph node involvement. Negative soft tissue biopsy of the diaphragm x2. Positive for K-ras mutation-codon 13   She completed concurrent radiation and Xeloda for treatment of the rectal tumor 03/06/2013 through 04/11/2013.   Status post low anterior resection with a diverting loop ileostomy on 05/31/2013 at [facility] in [place]. Final pathology showed moderately differentiated adenocarcinoma located in the upper rectum; the tumor measured 2.5 x 2.2 cm; there was invasion through the muscularis propria into pericolic adipose tissue; tumor budding was present. Lymphatic status negative; vascular status negative; proximal and distal surgical margins negative; 2 of 17 lymph nodes showed metastatic adenocarcinoma. Mesenteric tumor deposits were present. Perineural invasion present. [facility] additional [facility] and the tumor was staged as a T4a   CT abdomen/pelvis 05/12/2013. Metastatic nodules in the lung increased. Chronic loculated right base pneumothorax. Diffuse mucosal thickening  of the sigmoid portion of the colon.   Restaging CTs 07/25/2013  with enlargement of bilateral lung nodules, no other evidence of metastatic disease   Ileostomy takedown 08/14/2013   Chest CT 10/02/2013 revealed enlargement of bilateral lung nodules and increased right pleural fluid   Cycle 1 of FOLFIRI/Avastin 10/03/2013   Cycle 6 of FOLFIRI/Avastin 12/12/2013   Restaging CT 12/22/2013 with a decrease in the size of bilateral lung nodules, no evidence of disease progression   Cycle 7 FOLFIRI/Avastin 12/25/2013  Cycle 8 FOLFIRI/Avastin 01/10/2014  Cycle 9 FOLFIRI/Avastin 01/24/2014  Cycle 10 FOLFIRI/Avastin 02/07/2014  Cycle of 11 FOLFIRI/Avastin 02/21/2014  Restaging CT 03/05/2014 with a decrease in the size of 2 dominant right lung nodules, no new nodules, new pleural gas collection at the right apex  Cycle 12 FOLFIRI/Avastin 03/07/2014  Cycle 13 FOLFIRI/Avastin 03/20/2014  Cycle 14 FOLFIRI/Avastin 04/04/2014  Initiation of maintenance Xeloda/Avastin 04/18/2014  CT chest 07/09/2014 revealed slight enlargement of lung nodules  Treatment with single agent Avastin 07/10/2014  Resumption of FOLFIRI/Avastin on a 2 week schedule 07/25/2014  CT angio chest 09/14/2014 with essentially stable multifocal pulmonary metastatic disease.  5-fluorouracil dose reduced beginning 09/19/2014  FOLFIRI/Avastin switch to a 3 week schedule beginning with treatment 10/10/2014 secondary to malaise, diarrhea, and mucositis, irinotecan dose reduced  CT 12/26/2014 confirmed an increase in the size of bilateral lung nodules  FOLFOX/Avastin initiated on a 2 week schedule 01/02/2015  CT chest 03/20/2015 with slight enlargement of multiple lung nodules  Enrollment on an immunotherapy trial at [facility] March 2017-treatment began with Nivolumab, Ipilumumab, and cobimetinib 06/11/2015 2. Status post right VATS, drainage of pleural effusion, visceral and parietal pleural decortication 12/21/2012. Pleural fluid culture positive for viridans Streptococcus. Right  pleura biopsies negative for malignancy. Findings favored empyema. 3. Diabetes. 4. Frequent bowel movements, diminished pelvic muscle tone-we made a referral to the pelvic physical therapy clinic 5. Pain in the left leg with tenderness at the left calf and left ischium-MRI of the lumbar spine and pelvis on 10/13/2011 with no evidence of metastatic disease. A right iliac bone "lesion" was felt to most likely represent heterogenous marrow. Edema and enhancement was noted at the left sciatic nerve compared to the right side with no mass lesion 6. Hypertension-on HCTZ 7. Left foot fracture 8. BRIP1 mutation 9. Mucositis most likely related to 5-fluorouracil. Dose reduction of 5 fluorouracil beginning 09/19/2014. 10. Erythematous rash-nivolumab,ipilumumab, or cobimetinib 11. 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 The skin rash is fading. Treatment placed on hold 09/30/2015 secondary to the rash. She will leave for a [employer] later this week. She is scheduled for follow-up at [facility] to resume treatment on 10/29/2015. Restaging CTs at [facility] on 09/03/2015 revealed stable disease.  She will return for an office visit here on 11/06/2015.  Betsy Coder, MD  10/08/2015  9:52 AM

## 2015-10-08 NOTE — Telephone Encounter (Signed)
per pof to sch pt appt-gave pt copy of avs °

## 2015-10-09 ENCOUNTER — Encounter: Payer: Self-pay | Admitting: Oncology

## 2015-10-09 ENCOUNTER — Telehealth: Payer: Self-pay | Admitting: Medical Oncology

## 2015-10-09 ENCOUNTER — Ambulatory Visit: Payer: Medicare Other | Admitting: Oncology

## 2015-10-09 LAB — HEMOGLOBIN A1C
Est. average glucose Bld gHb Est-mCnc: 169 mg/dL
HEMOGLOBIN A1C: 7.5 % — AB (ref 4.8–5.6)

## 2015-10-09 NOTE — Progress Notes (Signed)
I sent note to [name] to ck with dr. Benay Spice to see if there is an alternative to Concerta. Her insurance will not cover

## 2015-10-09 NOTE — Telephone Encounter (Signed)
-----   Message from Ladell Pier, MD sent at 10/09/2015 11:35 AM EDT ----- Please call patient, hb A1c is mildly elevated

## 2015-10-09 NOTE — Telephone Encounter (Signed)
Message left

## 2015-10-10 ENCOUNTER — Other Ambulatory Visit: Payer: Self-pay | Admitting: *Deleted

## 2015-11-04 ENCOUNTER — Telehealth: Payer: Self-pay | Admitting: *Deleted

## 2015-11-04 NOTE — Telephone Encounter (Signed)
Message from pt asking if there is a new reason she has an appt this week? Returned call, informed her this is a routine visit. Noted pt called over the weekend with pain, she reports pain has resolved. She is unsure if it was related to resuming chemo tx or foods she had eaten.  She will come in as scheduled 8/9.

## 2015-11-06 ENCOUNTER — Ambulatory Visit (HOSPITAL_BASED_OUTPATIENT_CLINIC_OR_DEPARTMENT_OTHER): Payer: Medicare Other | Admitting: Nurse Practitioner

## 2015-11-06 DIAGNOSIS — C7650 Malignant neoplasm of unspecified lower limb: Secondary | ICD-10-CM | POA: Diagnosis not present

## 2015-11-06 DIAGNOSIS — C20 Malignant neoplasm of rectum: Secondary | ICD-10-CM | POA: Diagnosis present

## 2015-11-06 DIAGNOSIS — C7801 Secondary malignant neoplasm of right lung: Secondary | ICD-10-CM | POA: Diagnosis not present

## 2015-11-06 DIAGNOSIS — C7802 Secondary malignant neoplasm of left lung: Secondary | ICD-10-CM | POA: Diagnosis not present

## 2015-11-06 DIAGNOSIS — I10 Essential (primary) hypertension: Secondary | ICD-10-CM

## 2015-11-06 MED ORDER — OXYCODONE-ACETAMINOPHEN 10-325 MG PO TABS: 1.0000 | ORAL_TABLET | ORAL | 0 refills | Status: DC | PRN

## 2015-11-06 NOTE — Progress Notes (Addendum)
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 She resumed treatment on study at [facility] 10/29/2015. The rash has not recurred. She denies nausea/vomiting. Bowels alternate constipation and diarrhea. She has stable abdominal pain. She takes Percocet as needed. She describes her appetite as "fair".  Objective:  Vital signs in last 24 hours:  Blood pressure (!) 148/93, pulse 67, temperature 98 F (36.7 C), temperature source Oral, resp. rate 18, height '5\' 3"'$  (1.6 m), weight 126 lb 1.6 oz (57.2 kg), SpO2 97 %.    HEENT: No thrush or ulcers. Resp: Decreased breath sounds at the right lung base. No respiratory distress. Cardio: Regular rate and rhythm. GI: Abdomen soft and nontender. No hepatomegaly. Vascular: Trace edema at the lower legs bilaterally left greater than right.  Skin: Resolving erythematous rash over the trunk and extremities. Port-A-Cath without erythema.    Lab Results:  Lab Results  Component Value Date   WBC 4.4 05/24/2015   HGB 13.2 05/24/2015   HCT 40.3 05/24/2015   MCV 87.8 05/24/2015   PLT 230 05/24/2015   NEUTROABS 3.0 05/24/2015    Imaging:  No results found.  Medications: I have reviewed the patient's current medications.  Assessment/Plan: 1. Metastatic rectal cancer diagnosed July 2014. MSI stable on the March 2015 resection specimen. Foundation 1 Testing confirmed a KRAS G13 mutation, APC mutation, and BRIP1 mutation.  Colonoscopy on 10/25/2012 showed a bulky circumferential friable mass lesion in the rectum at approximately 4 cm above the dentate line. The mass was subtotally obstructing and would not permit passage of the colonoscope proximal. Biopsy showed adenocarcinoma.   Staging CT scans 10/25/2012 showed a right lower lobe lung mass measuring 5 x 4.6 cm; 0.8 cm nodule right upper lobe; masslike rectosigmoid wall thickening, surrounding stranding and  adjacent lymphadenopathy.   PET scan 11/02/2012 showed hypermetabolism in the area of apparent circumferential rectal wall thickening; the 5 cm right lower lobe pulmonary mass was hypermetabolic with SUV max equal 8. No other hypermetabolic lung lesions were evident. Small bilateral pulmonary parenchymal nodules were noted.   She completed 5 cycles of FOLFOX chemotherapy 11/22/2012 through 02/13/2013.   Status post right thoracotomy, bilobectomy (right lower and middle lobectomy) with en bloc resection of the diaphragm 02/27/2013. Pathology showed adenocarcinoma consistent with metastatic colorectal adenocarcinoma, 4.3 cm, negative margins, no lymph node involvement. Negative soft tissue biopsy of the diaphragm x2. Positive for K-ras mutation-codon 13   She completed concurrent radiation and Xeloda for treatment of the rectal tumor 03/06/2013 through 04/11/2013.   Status post low anterior resection with a diverting loop ileostomy on 05/31/2013 at [facility] in [place]. Final pathology showed moderately differentiated adenocarcinoma located in the upper rectum; the tumor measured 2.5 x 2.2 cm; there was invasion through the muscularis propria into pericolic adipose tissue; tumor budding was present. Lymphatic status negative; vascular status negative; proximal and distal surgical margins negative; 2 of 17 lymph nodes showed metastatic adenocarcinoma. Mesenteric tumor deposits were present. Perineural invasion present. [facility] an additional positive [facility] node and the tumor was staged as a T4a   CT abdomen/pelvis 05/12/2013. Metastatic nodules in the lung increased. Chronic loculated right base pneumothorax. Diffuse mucosal thickening of the sigmoid portion of the colon.   Restaging CTs 07/25/2013 with enlargement of bilateral lung nodules, no other evidence of metastatic disease   Ileostomy takedown 08/14/2013   Chest CT 10/02/2013 revealed enlargement of  bilateral  lung nodules and increased right pleural fluid   Cycle 1 of FOLFIRI/Avastin 10/03/2013   Cycle 6 of FOLFIRI/Avastin 12/12/2013   Restaging CT 12/22/2013 with a decrease in the size of bilateral lung nodules, no evidence of disease progression   Cycle 7 FOLFIRI/Avastin 12/25/2013  Cycle 8 FOLFIRI/Avastin 01/10/2014  Cycle 9 FOLFIRI/Avastin 01/24/2014  Cycle 10 FOLFIRI/Avastin 02/07/2014  Cycle of 11 FOLFIRI/Avastin 02/21/2014  Restaging CT 03/05/2014 with a decrease in the size of 2 dominant right lung nodules, no new nodules, new pleural gas collection at the right apex  Cycle 12 FOLFIRI/Avastin 03/07/2014  Cycle 13 FOLFIRI/Avastin 03/20/2014  Cycle 14 FOLFIRI/Avastin 04/04/2014  Initiation of maintenance Xeloda/Avastin 04/18/2014  CT chest 07/09/2014 revealed slight enlargement of lung nodules  Treatment with single agent Avastin 07/10/2014  Resumption of FOLFIRI/Avastin on a 2 week schedule 07/25/2014  CT angio chest 09/14/2014 with essentially stable multifocal pulmonary metastatic disease.  5-fluorouracil dose reduced beginning 09/19/2014  FOLFIRI/Avastin switch to a 3 week schedule beginning with treatment 10/10/2014 secondary to malaise, diarrhea, and mucositis, irinotecan dose reduced  CT 12/26/2014 confirmed an increase in the size of bilateral lung nodules  FOLFOX/Avastin initiated on a 2 week schedule 01/02/2015  CT chest 03/20/2015 with slight enlargement of multiple lung nodules  Enrollment on an immunotherapy trial at [facility] March 2017-treatment began with Nivolumab, Ipilumumab, and cobimetinib 06/11/2015  Treatment placed on hold due to a skin rash 09/30/2015  Restaging CT scans 10/28/2015 with interval increase in the size of multiple bilateral pulmonary nodules and masses, increased size of right adrenal mass  Treatment resumed 10/29/2015 2. Status post right VATS, drainage of pleural effusion, visceral and parietal pleural decortication  12/21/2012. Pleural fluid culture positive for viridans Streptococcus. Right pleura biopsies negative for malignancy. Findings favored empyema. 3. Diabetes. 4. Frequent bowel movements, diminished pelvic muscle tone-we made a referral to the pelvic physical therapy clinic 5. Pain in the left leg with tenderness at the left calf and left ischium-MRI of the lumbar spine and pelvis on 10/13/2011 with no evidence of metastatic disease. A right iliac bone "lesion" was felt to most likely represent heterogenous marrow. Edema and enhancement was noted at the left sciatic nerve compared to the right side with no mass lesion 6. Hypertension-on HCTZ 7. Left foot fracture 8. BRIP1 mutation 9. Mucositis most likely related to 5-fluorouracil. Dose reduction of 5 fluorouracil beginning 09/19/2014. 10. Erythematous rash-nivolumab,ipilumumab, or cobimetinib 11. 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 She has resumed treatment on study at [facility]. She is seen there every 2 weeks. We scheduled a follow-up visit here in 6 weeks. She was provided with a new Percocet prescription at today's visit.  Patient seen with Dr. Benay Spice.  Ned Card ANP/GNP-BC   11/06/2015  8:45 AM  This was a shared visit with Ned Card. 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 She continues treatment on study at [facility]. She will return for an office visit in 6 weeks. We are available to see her sooner as needed.  Julieanne Manson, M.D.

## 2015-11-11 ENCOUNTER — Emergency Department (HOSPITAL_COMMUNITY): Payer: Medicare Other

## 2015-11-11 ENCOUNTER — Other Ambulatory Visit (HOSPITAL_BASED_OUTPATIENT_CLINIC_OR_DEPARTMENT_OTHER): Payer: Medicare Other | Admitting: *Deleted

## 2015-11-11 ENCOUNTER — Telehealth: Payer: Self-pay | Admitting: Oncology

## 2015-11-11 ENCOUNTER — Ambulatory Visit (HOSPITAL_BASED_OUTPATIENT_CLINIC_OR_DEPARTMENT_OTHER): Payer: Medicare Other | Admitting: Nurse Practitioner

## 2015-11-11 ENCOUNTER — Ambulatory Visit: Payer: Medicare Other

## 2015-11-11 ENCOUNTER — Encounter (HOSPITAL_COMMUNITY): Payer: Self-pay | Admitting: Surgery

## 2015-11-11 ENCOUNTER — Telehealth: Payer: Self-pay | Admitting: *Deleted

## 2015-11-11 ENCOUNTER — Other Ambulatory Visit: Payer: Self-pay | Admitting: *Deleted

## 2015-11-11 ENCOUNTER — Encounter: Payer: Self-pay | Admitting: Nurse Practitioner

## 2015-11-11 ENCOUNTER — Ambulatory Visit (HOSPITAL_COMMUNITY)
Admission: RE | Admit: 2015-11-11 | Discharge: 2015-11-11 | Disposition: A | Discharge status: Home / Self Care | Payer: Medicare Other | Source: Ambulatory Visit | Attending: Nurse Practitioner | Admitting: Nurse Practitioner

## 2015-11-11 ENCOUNTER — Inpatient Hospital Stay (HOSPITAL_COMMUNITY)
Admission: EM | Admit: 2015-11-11 | Discharge: 2015-11-13 | DRG: 389 | Disposition: A | Discharge status: Home / Self Care | Payer: Medicare Other | Attending: Surgery | Admitting: Surgery

## 2015-11-11 ENCOUNTER — Encounter (HOSPITAL_COMMUNITY): Payer: Self-pay | Admitting: Nurse Practitioner

## 2015-11-11 ENCOUNTER — Other Ambulatory Visit: Payer: Self-pay | Admitting: Nurse Practitioner

## 2015-11-11 VITALS — BP 122/60 | HR 75 | Temp 98.3°F | Resp 18 | Ht 63.0 in | Wt 124.8 lb

## 2015-11-11 DIAGNOSIS — C20 Malignant neoplasm of rectum: Secondary | ICD-10-CM

## 2015-11-11 DIAGNOSIS — C7970 Secondary malignant neoplasm of unspecified adrenal gland: Secondary | ICD-10-CM | POA: Diagnosis present

## 2015-11-11 DIAGNOSIS — Z9049 Acquired absence of other specified parts of digestive tract: Secondary | ICD-10-CM

## 2015-11-11 DIAGNOSIS — C7800 Secondary malignant neoplasm of unspecified lung: Secondary | ICD-10-CM | POA: Diagnosis present

## 2015-11-11 DIAGNOSIS — R110 Nausea: Secondary | ICD-10-CM

## 2015-11-11 DIAGNOSIS — E860 Dehydration: Secondary | ICD-10-CM

## 2015-11-11 DIAGNOSIS — R109 Unspecified abdominal pain: Secondary | ICD-10-CM

## 2015-11-11 DIAGNOSIS — Z90710 Acquired absence of both cervix and uterus: Secondary | ICD-10-CM

## 2015-11-11 DIAGNOSIS — R300 Dysuria: Secondary | ICD-10-CM | POA: Insufficient documentation

## 2015-11-11 DIAGNOSIS — E119 Type 2 diabetes mellitus without complications: Secondary | ICD-10-CM | POA: Diagnosis present

## 2015-11-11 DIAGNOSIS — R1030 Lower abdominal pain, unspecified: Secondary | ICD-10-CM

## 2015-11-11 DIAGNOSIS — R609 Edema, unspecified: Secondary | ICD-10-CM | POA: Insufficient documentation

## 2015-11-11 DIAGNOSIS — R21 Rash and other nonspecific skin eruption: Secondary | ICD-10-CM | POA: Diagnosis present

## 2015-11-11 DIAGNOSIS — H9190 Unspecified hearing loss, unspecified ear: Secondary | ICD-10-CM | POA: Diagnosis present

## 2015-11-11 DIAGNOSIS — K219 Gastro-esophageal reflux disease without esophagitis: Secondary | ICD-10-CM | POA: Diagnosis present

## 2015-11-11 DIAGNOSIS — Z9221 Personal history of antineoplastic chemotherapy: Secondary | ICD-10-CM

## 2015-11-11 DIAGNOSIS — K5939 Other megacolon: Secondary | ICD-10-CM

## 2015-11-11 DIAGNOSIS — Z833 Family history of diabetes mellitus: Secondary | ICD-10-CM

## 2015-11-11 DIAGNOSIS — C7971 Secondary malignant neoplasm of right adrenal gland: Secondary | ICD-10-CM | POA: Diagnosis present

## 2015-11-11 DIAGNOSIS — K562 Volvulus: Secondary | ICD-10-CM | POA: Diagnosis not present

## 2015-11-11 DIAGNOSIS — Z86010 Personal history of colonic polyps: Secondary | ICD-10-CM

## 2015-11-11 DIAGNOSIS — R600 Localized edema: Secondary | ICD-10-CM | POA: Insufficient documentation

## 2015-11-11 DIAGNOSIS — Z803 Family history of malignant neoplasm of breast: Secondary | ICD-10-CM

## 2015-11-11 DIAGNOSIS — Z8249 Family history of ischemic heart disease and other diseases of the circulatory system: Secondary | ICD-10-CM

## 2015-11-11 DIAGNOSIS — Z8041 Family history of malignant neoplasm of ovary: Secondary | ICD-10-CM

## 2015-11-11 LAB — CBC WITH DIFFERENTIAL/PLATELET
BASO%: 0.6 % (ref 0.0–2.0)
BASOS ABS: 0.1 10*3/uL (ref 0.0–0.1)
Basophils Absolute: 0 10*3/uL (ref 0.0–0.1)
Basophils Relative: 0 %
EOS ABS: 0.1 10*3/uL (ref 0.0–0.5)
EOS%: 1.5 % (ref 0.0–7.0)
Eosinophils Absolute: 0 10*3/uL (ref 0.0–0.7)
Eosinophils Relative: 0 %
HEMATOCRIT: 39 % (ref 34.8–46.6)
HEMATOCRIT: 35.1 % — AB (ref 36.0–46.0)
HEMOGLOBIN: 12.6 g/dL (ref 11.6–15.9)
Hemoglobin: 11.5 g/dL — ABNORMAL LOW (ref 12.0–15.0)
LYMPH#: 1.4 10*3/uL (ref 0.9–3.3)
LYMPH%: 14.3 % (ref 14.0–49.7)
LYMPHS PCT: 13 %
Lymphs Abs: 1.2 10*3/uL (ref 0.7–4.0)
MCH: 28.5 pg (ref 25.1–34.0)
MCH: 28.5 pg (ref 26.0–34.0)
MCHC: 32.4 g/dL (ref 31.5–36.0)
MCHC: 32.8 g/dL (ref 30.0–36.0)
MCV: 88.1 fL (ref 79.5–101.0)
MCV: 86.9 fL (ref 78.0–100.0)
MONO ABS: 0.4 10*3/uL (ref 0.1–1.0)
MONO#: 0.4 10*3/uL (ref 0.1–0.9)
MONO%: 3.9 % (ref 0.0–14.0)
MONOS PCT: 5 %
NEUT%: 79.7 % — ABNORMAL HIGH (ref 38.4–76.8)
NEUTROS ABS: 7.6 10*3/uL — AB (ref 1.5–6.5)
NEUTROS ABS: 7.4 10*3/uL (ref 1.7–7.7)
Neutrophils Relative %: 82 %
PLATELETS: 322 10*3/uL (ref 145–400)
Platelets: 287 10*3/uL (ref 150–400)
RBC: 4.42 10*6/uL (ref 3.70–5.45)
RBC: 4.04 MIL/uL (ref 3.87–5.11)
RDW: 14.9 % — AB (ref 11.2–14.5)
RDW: 14.5 % (ref 11.5–15.5)
WBC: 9.5 10*3/uL (ref 3.9–10.3)
WBC: 9.1 10*3/uL (ref 4.0–10.5)

## 2015-11-11 LAB — COMPREHENSIVE METABOLIC PANEL
ALBUMIN: 3.4 g/dL — AB (ref 3.5–5.0)
ALBUMIN: 3.7 g/dL (ref 3.5–5.0)
ALK PHOS: 67 U/L (ref 40–150)
ALK PHOS: 56 U/L (ref 38–126)
ALT: 22 U/L (ref 0–55)
ALT: 26 U/L (ref 14–54)
ANION GAP: 8 (ref 5–15)
AST: 35 U/L — ABNORMAL HIGH (ref 5–34)
AST: 34 U/L (ref 15–41)
Anion Gap: 11 mEq/L (ref 3–11)
BILIRUBIN TOTAL: 0.3 mg/dL (ref 0.20–1.20)
BILIRUBIN TOTAL: 0.6 mg/dL (ref 0.3–1.2)
BUN: 17 mg/dL (ref 7.0–26.0)
BUN: 19 mg/dL (ref 6–20)
CALCIUM: 10.3 mg/dL (ref 8.4–10.4)
CALCIUM: 9 mg/dL (ref 8.9–10.3)
CO2: 27 mEq/L (ref 22–29)
CO2: 27 mmol/L (ref 22–32)
CREATININE: 0.4 mg/dL — AB (ref 0.44–1.00)
Chloride: 99 mEq/L (ref 98–109)
Chloride: 101 mmol/L (ref 101–111)
Creatinine: 0.7 mg/dL (ref 0.6–1.1)
EGFR: 82 mL/min/{1.73_m2} — AB (ref >90)
GFR calc non Af Amer: >60 mL/min (ref >60)
GLUCOSE: 149 mg/dL — AB (ref 65–99)
Glucose: 243 mg/dl — ABNORMAL HIGH (ref 70–140)
POTASSIUM: 3.7 meq/L (ref 3.5–5.1)
Potassium: 4.3 mmol/L (ref 3.5–5.1)
Sodium: 137 mEq/L (ref 136–145)
Sodium: 136 mmol/L (ref 135–145)
TOTAL PROTEIN: 7.3 g/dL (ref 6.4–8.3)
TOTAL PROTEIN: 6.7 g/dL (ref 6.5–8.1)

## 2015-11-11 LAB — URINE MICROSCOPIC-ADD ON

## 2015-11-11 LAB — URINALYSIS, ROUTINE W REFLEX MICROSCOPIC
BILIRUBIN URINE: NEGATIVE
Glucose, UA: NEGATIVE mg/dL
Ketones, ur: 15 mg/dL — AB
Nitrite: POSITIVE — AB
PH: 5.5 (ref 5.0–8.0)
Protein, ur: 30 mg/dL — AB
SPECIFIC GRAVITY, URINE: 1.027 (ref 1.005–1.030)

## 2015-11-11 LAB — I-STAT CG4 LACTIC ACID, ED: Lactic Acid, Venous: 0.96 mmol/L (ref 0.5–1.9)

## 2015-11-11 LAB — LIPASE, BLOOD: Lipase: 27 U/L (ref 11–51)

## 2015-11-11 MED ORDER — SODIUM CHLORIDE 0.9 % IV SOLN
Freq: Once | INTRAVENOUS | Status: AC
  Administered 2015-11-11: 15:00:00 via INTRAVENOUS

## 2015-11-11 MED ORDER — IOPAMIDOL (ISOVUE-300) INJECTION 61%
100.0000 mL | Freq: Once | INTRAVENOUS | Status: AC | PRN
  Administered 2015-11-11: 100 mL via INTRAVENOUS

## 2015-11-11 MED ORDER — MORPHINE SULFATE 4 MG/ML IJ SOLN
2.0000 mg | Freq: Once | INTRAMUSCULAR | Status: AC
  Administered 2015-11-11: 2 mg via INTRAVENOUS
  Filled 2015-11-11: qty 1

## 2015-11-11 MED ORDER — DIATRIZOATE MEGLUMINE & SODIUM 66-10 % PO SOLN: 30.0000 mL | Freq: Once | ORAL | Status: DC

## 2015-11-11 MED ORDER — MORPHINE SULFATE (PF) 4 MG/ML IV SOLN
INTRAVENOUS | Status: AC
  Filled 2015-11-11: qty 1

## 2015-11-11 MED ORDER — ONDANSETRON HCL 40 MG/20ML IJ SOLN
8.0000 mg | Freq: Once | INTRAMUSCULAR | Status: AC
  Administered 2015-11-11: 8 mg via INTRAVENOUS
  Filled 2015-11-11: qty 4

## 2015-11-11 MED ORDER — SODIUM CHLORIDE 0.9 % IV SOLN
INTRAVENOUS | Status: AC
  Administered 2015-11-11: 11:00:00 via INTRAVENOUS

## 2015-11-11 NOTE — Assessment & Plan Note (Addendum)
Patient states that she developed chronic nausea and lower generalized abdominal discomfort within the past 24 hours.  She rated the pain at 8 out of 10 on the pain scale initially.  She states that her last regular bowel movement was Saturday evening.  11/09/2015.  She denies any recent fevers or chills.  Exam today reveals bowel sounds positive in all 4 quads.  Abdomen feels slightly firm; and is tender with any palpation.  There are no masses palpated.  Abdominal x-ray obtained today revealed: IMPRESSION: Colonic dilatation in the mid and right upper abdomen. This raises suspicion for possible cecal volvulus. CT may be helpful for further evaluation. No free air is noted.  Dr. Benay Spice in to examine patient as well; and decision was made that patient be transported to the emergency department for further evaluation and management.  Brief history.  Report was called to the emergency department charge nurse; prior to patient being transported to the emergency department via wheelchair.  Per the cancer Center nurse.  Note: Patient should be considered a full code; since there are no advanced directives in the patient's chart.

## 2015-11-11 NOTE — Progress Notes (Signed)
Pt actively vomiting upon arrival. Placed in room in recliner. Port accessed and fluids started per order [name], NP  Pt taken to Radiolgy for Abd films.  Radiology brought pt back to [facility]. Pt seen by Dr. Benay Spice.   Pt taken to ED for suspected SBO. Port flushed and capped.   SYMPTOM MANAGEMENT CLINIC    Chief Complaint: Nausea, diarrhea, dehydration  HPI:  Anne [name] 70 y.o. female diagnosed with rectal cancer with lung metastasis.  Currently undergoing a clinical trial study per [facility].  Patient presented to the cancer today with complaint of nausea and diarrhea.  Also complaining of lower abdominal discomfort.  He denies any recent fevers or chills.   No history exists.    Review of Systems  Constitutional: Positive for malaise/fatigue and weight loss.  Gastrointestinal: Positive for abdominal pain, diarrhea and nausea. Negative for blood in stool and vomiting.  All other systems reviewed and are negative.   Past Medical History:  Diagnosis Date  . Asthma    as a child none since 42 years old  . Colon cancer ([place])    rectal cancer with lung metastasis  . Complication of anesthesia    pt hadpanic attacks with versed, no versed  . Diabetes mellitus without complication ([place])   . Early cataract    stoneburner   . Family history of malignant neoplasm of breast   . Family history of malignant neoplasm of ovary   . Fracture of left foot    2015 trip  . 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 Headache(784.0)   . Heart murmur, systolic 09/05/4852   innocent per Dr. Gershon Mussel   . High frequency hearing loss    [facility] followed   . History of renal stone 6 12   dahlstedt  . Hx of colonic polyps   . Hx of compression fracture of spine 1985   t12 after a  20 foot fall  . Hx of fracture of wrist    after fall   . Hx of  hyperglycemia 07/01/2011  . Postmenopausal HRT (hormone replacement therapy) 07/01/2011   surgical menopause  . Skin cancer    multiple  . Status post chemotherapy    completed a total of three cycles of Folfox     Past Surgical History:  Procedure Laterality Date  . ADENOIDECTOMY  1952  . [place]   right [place]  . COLONOSCOPY N/A 10/24/2012   Procedure: COLONOSCOPY;  Surgeon: Irene Shipper, MD;  Location: [facility] ENDOSCOPY;  Service: Endoscopy;  Laterality: N/A;  . COLONOSCOPY N/A 10/25/2012   Procedure: COLONOSCOPY;  Surgeon: Irene Shipper, MD;  Location: [place];  Service: Endoscopy;  Laterality: N/A;  . compression fraction  1985   T12  . COSMETIC SURGERY  1999-2007   holderness blepharoplasty facial rhytidectomy mastopexy abdominoplasty   . DECORTICATION Right 12/21/2012   Procedure: DECORTICATION;  Surgeon: Melrose Nakayama, MD;  Location: [place];  Service: Thoracic;  Laterality: Right;  . dental implant    . DILATION AND CURETTAGE OF UTERUS  1977  . PLEURAL EFFUSION DRAINAGE Right 12/21/2012   Procedure: DRAINAGE OF PLEURAL EFFUSION;  Surgeon: Melrose Nakayama, MD;  Location: [place];  Service: Thoracic;  Laterality: Right;  . PORTACATH PLACEMENT Left 11/21/2012   Procedure: INSERTION PORT-A-CATH;  Surgeon:  Melrose Nakayama, MD;  Location: [place];  Service: Thoracic;  Laterality: Left;  . THOROCOTOMY WITH LOBECTOMY Right 02/27/2013   Procedure: THOROCOTOMY WITH BI- LOBECTOMY;  Surgeon: Melrose Nakayama, MD;  Location: [place];  Service: Thoracic;  Laterality: Right;  . TONSILLECTOMY  1952  . TOTAL ABDOMINAL HYSTERECTOMY  2005   with cystocele repair   . VIDEO ASSISTED THORACOSCOPY Right 12/21/2012   Procedure: VIDEO ASSISTED THORACOSCOPY;  Surgeon: Melrose Nakayama, MD;  Location: [place];  Service: Thoracic;  Laterality: Right;  Marland Kitchen VIDEO BRONCHOSCOPY Bilateral 10/27/2012   Procedure: VIDEO BRONCHOSCOPY WITH FLUORO;  Surgeon: Chesley Mires, MD;   Location: [facility] ENDOSCOPY;  Service: Endoscopy;  Laterality: Bilateral;  . 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    is allergic to ativan [lorazepam]; midazolam; zolpidem tartrate; bacitracin; moviprep [peg-kcl-nacl-nasulf-na asc-c]; and neomycin-bacitracin zn-polymyx.    Medication List       Accurate as of 11/11/15  6:20 PM. Always use your most recent med list.          amLODipine 10 MG tablet Commonly known as:  NORVASC Take 10 mg by mouth daily.   carbamide peroxide 6.5 % otic solution Commonly known as:  DEBROX Place 5 drops into both ears 2 (two) times daily as needed (for wax removal).   cobimetinib fumarate 20 MG tablet Commonly known as:  COTELLIC Take 60 mg by mouth daily.   diphenhydrAMINE 25 mg capsule Commonly known as:  BENADRYL Take 50 mg by mouth every 6 (six) hours as needed for itching, allergies or sleep.   estrogen-methylTESTOSTERone 1.25-2.5 MG tablet Commonly known as:  ESTRATEST Take 1 tablet by mouth daily.   hydrochlorothiazide 12.5 MG capsule Commonly known as:  MICROZIDE Take 12.5 mg by mouth daily.   hydrocortisone 2.5 % cream Apply 1 application topically 2 (two) times daily as  needed (for itching).   loperamide 2 MG capsule Commonly known as:  IMODIUM Take 2 mg by mouth as needed for diarrhea or loose stools.   loratadine 10 MG tablet Commonly known as:  CLARITIN Take 10 mg by mouth daily.   Melatonin 3 MG Tabs Take 3 mg by mouth at bedtime as needed (for sleep).   metFORMIN 1000 MG tablet Commonly known as:  GLUCOPHAGE Take 1,000 mg by mouth 2 (two) times daily with a meal.   methylphenidate 27 MG CR tablet Commonly known as:  CONCERTA Take 1 tablet (27 mg total) by mouth daily.   MINIVELLE 0.0375 MG/24HR Generic drug:  estradiol Place 1 patch onto the skin 2 (two) times a week.   multivitamin with minerals Tabs tablet Take 1 tablet by mouth daily.   ondansetron 8 MG tablet Commonly known as:  ZOFRAN Take 1 tablet (8 mg total) by mouth every 8 (eight) hours as needed for nausea or vomiting.   OPCON-A 0.027-0.315 % Soln  Generic drug:  Naphazoline-Pheniramine Place 1 drop into both eyes daily.   oxyCODONE-acetaminophen 10-325 MG tablet Commonly known as:  PERCOCET Take 1 tablet by mouth every 4 (four) hours as needed for pain.   PRILOSEC 20 MG capsule Generic drug:  omeprazole Take 20 mg by mouth daily.   sitaGLIPtin 100 MG tablet Commonly known as:  JANUVIA Take 100 mg by mouth daily.   triamcinolone cream 0.5 % Commonly known as:  KENALOG Apply 1 application topically 2 (two) times daily.        PHYSICAL EXAMINATION  Oncology Vitals 11/11/2015 11/11/2015  Height - -  Weight - -  Weight (lbs) - -  BMI (kg/m2) - -  Temp 98.3 98.3  Pulse 68 75  Resp - 18  SpO2 97 100  BSA (m2) - -   BP Readings from Last 2 Encounters:  11/11/15 151/72  11/11/15 122/60    Physical Exam  Constitutional: She is oriented to person, place, and time. Vital signs are normal. She appears malnourished and dehydrated. She appears unhealthy. She appears cachectic.  HENT:  Head: Normocephalic and atraumatic.  Mouth/Throat: Oropharynx is clear  and moist.  Eyes: Conjunctivae and EOM are normal. Pupils are equal, round, and reactive to light. Right eye exhibits no discharge. Left eye exhibits no discharge. No scleral icterus.  Neck: Normal range of motion. Neck supple. No JVD present. No tracheal deviation present. No thyromegaly present.  Cardiovascular: Normal rate, regular rhythm, normal heart sounds and intact distal pulses.   Pulmonary/Chest: Effort normal and breath sounds normal. No respiratory distress. She has no wheezes. She has no rales. She exhibits no tenderness.  Abdominal: Bowel sounds are normal. She exhibits no distension and no mass. There is no rebound and no guarding.  Bowel sounds positive in all 4 quadrants.  There is some mild firmness to generalized abdomen; and some tenderness with palpation.  Musculoskeletal: Normal range of motion. She exhibits edema. She exhibits no tenderness or deformity.  Bilateral lower extremity edema; with the left slightly greater than the right.  No calf tenderness on exam.  Lymphadenopathy:    She has no cervical adenopathy.  Neurological: She is alert and oriented to person, place, and time. Gait normal.  Skin: Skin is warm and dry. No rash noted. No erythema. There is pallor.  Psychiatric: Affect normal.  Nursing note and vitals reviewed.   LABORATORY DATA:. Admission on 11/11/2015  Component Date Value Ref Range Status  . WBC 11/11/2015 9.1  4.0 - 10.5 K/uL Final  . RBC 11/11/2015 4.04  3.87 - 5.11 MIL/uL Final  . Hemoglobin 11/11/2015 11.5* 12.0 - 15.0 g/dL Final  . HCT 11/11/2015 35.1* 36.0 - 46.0 % Final  . MCV 11/11/2015 86.9  78.0 - 100.0 fL Final  . MCH 11/11/2015 28.5  26.0 - 34.0 pg Final  . MCHC 11/11/2015 32.8  30.0 - 36.0 g/dL Final  . RDW 11/11/2015 14.5  11.5 - 15.5 % Final  . Platelets 11/11/2015 287  150 - 400 K/uL Final  . Neutrophils Relative % 11/11/2015 82  % Final  . Neutro Abs 11/11/2015 7.4  1.7 - 7.7 K/uL Final  . Lymphocytes Relative 11/11/2015 13   % Final  . Lymphs Abs 11/11/2015 1.2  0.7 - 4.0 K/uL Final  . Monocytes Relative 11/11/2015 5  % Final  . Monocytes Absolute 11/11/2015 0.4  0.1 - 1.0 K/uL Final  . Eosinophils Relative 11/11/2015 0  % Final  . Eosinophils Absolute 11/11/2015 0.0  0.0 -  0.7 K/uL Final  . Basophils Relative 11/11/2015 0  % Final  . Basophils Absolute 11/11/2015 0.0  0.0 - 0.1 K/uL Final  . Lactic Acid, Venous 11/11/2015 0.96  0.5 - 1.9 mmol/L Final  Orders Only on 11/11/2015  Component Date Value Ref Range Status  . WBC 11/11/2015 9.5  3.9 - 10.3 10e3/uL Final  . NEUT# 11/11/2015 7.6* 1.5 - 6.5 10e3/uL Final  . HGB 11/11/2015 12.6  11.6 - 15.9 g/dL Final  . HCT 11/11/2015 39.0  34.8 - 46.6 % Final  . Platelets 11/11/2015 322  145 - 400 10e3/uL Final  . MCV 11/11/2015 88.1  79.5 - 101.0 fL Final  . MCH 11/11/2015 28.5  25.1 - 34.0 pg Final  . MCHC 11/11/2015 32.4  31.5 - 36.0 g/dL Final  . RBC 11/11/2015 4.42  3.70 - 5.45 10e6/uL Final  . RDW 11/11/2015 14.9* 11.2 - 14.5 % Final  . lymph# 11/11/2015 1.4  0.9 - 3.3 10e3/uL Final  . MONO# 11/11/2015 0.4  0.1 - 0.9 10e3/uL Final  . Eosinophils Absolute 11/11/2015 0.1  0.0 - 0.5 10e3/uL Final  . Basophils Absolute 11/11/2015 0.1  0.0 - 0.1 10e3/uL Final  . NEUT% 11/11/2015 79.7* 38.4 - 76.8 % Final  . LYMPH% 11/11/2015 14.3  14.0 - 49.7 % Final  . MONO% 11/11/2015 3.9  0.0 - 14.0 % Final  . EOS% 11/11/2015 1.5  0.0 - 7.0 % Final  . BASO% 11/11/2015 0.6  0.0 - 2.0 % Final  . Sodium 11/11/2015 137  136 - 145 mEq/L Final  . Potassium 11/11/2015 3.7  3.5 - 5.1 mEq/L Final  . Chloride 11/11/2015 99  98 - 109 mEq/L Final  . CO2 11/11/2015 27  22 - 29 mEq/L Final  . Glucose 11/11/2015 243* 70 - 140 mg/dl Final  . BUN 11/11/2015 17.0  7.0 - 26.0 mg/dL Final  . Creatinine 11/11/2015 0.7  0.6 - 1.1 mg/dL Final  . Total Bilirubin 11/11/2015 0.30  0.20 - 1.20 mg/dL Final  . Alkaline Phosphatase 11/11/2015 67  40 - 150 U/L Final  . AST 11/11/2015 35* 5 - 34 U/L  Final  . ALT 11/11/2015 22  0 - 55 U/L Final  . Total Protein 11/11/2015 7.3  6.4 - 8.3 g/dL Final  . Albumin 11/11/2015 3.4* 3.5 - 5.0 g/dL Final  . Calcium 11/11/2015 10.3  8.4 - 10.4 mg/dL Final  . Anion Gap 11/11/2015 11  3 - 11 mEq/L Final  . EGFR 11/11/2015 82* >90 ml/min/1.73 m2 Final    RADIOGRAPHIC STUDIES: Dg Abd 2 Views  Result Date: 11/11/2015 CLINICAL DATA:  Vomiting and increasing abdominal pain, history of metastatic colon carcinoma EXAM: ABDOMEN - 2 VIEW COMPARISON:  None. FINDINGS: Scattered large and small bowel gas is noted. Some distension of the colon in the midline is noted to 10.8 cm. Air-fluid level is seen suggestive of some obstructive change. Fecal material is noted throughout the colon. No definitive free air is seen. Postsurgical changes are noted in the right lung base. IMPRESSION: Colonic dilatation in the mid and right upper abdomen. This raises suspicion for possible cecal volvulus. CT may be helpful for further evaluation. No free air is noted. Electronically Signed   By: Inez Catalina M.D.   On: 11/11/2015 13:47    ASSESSMENT/PLAN:    Rectal cancer metastasized to lung Patient is currently undergoing a clinical trial study at [facility].  Patient is scheduled to return for follow-up visit with Dr. Benay Spice on 12/18/2015.  Peripheral edema Patient has mild peripheral edema to bilateral lower extremities; with the left slightly greater than the right.  Patient denies any calf pain.  Patient states that her edema to her bilateral lower extremities is chronic for her.  However,-patient states she's had no Doppler ultrasound studies in the past to determine if she is positive for DVT.  Would recommend Doppler ultrasound to rule out any possible DVT to bilateral lower extremities.  Nausea without vomiting Patient reports chronic nausea; but no vomiting for the past 24 hours.  She states that anti--nausea medications have been ineffective for her.  Patient  has remained nothing by mouth while in the cancer Center today.  Patient will be transported to the emergency department for further evaluation and management today.  Dehydration Patient reports chronic nausea and decreased oral intake for the past 24 hours.  She feels dehydrated today; and received approximately 1 L normal saline IV fluid rehydration while at the [facility] today.  Patient will be transported to the emergency department for further evaluation and management this afternoon.  Abdominal pain Patient states that she developed chronic nausea and lower generalized abdominal discomfort within the past 24 hours.  She rated the pain at 8 out of 10 on the pain scale initially.  She states that her last regular bowel movement was Saturday evening.  11/09/2015.  She denies any recent fevers or chills.  Exam today reveals bowel sounds positive in all 4 quads.  Abdomen feels slightly firm; and is tender with any palpation.  There are no masses palpated.  Abdominal x-ray obtained today revealed: IMPRESSION: Colonic dilatation in the mid and right upper abdomen. This raises suspicion for possible cecal volvulus. CT may be helpful for further evaluation. No free air is noted.  Dr. Benay Spice in to examine patient as well; and decision was made that patient be transported to the emergency department for further evaluation and management.  Brief history.  Report was called to the emergency department charge nurse; prior to patient being transported to the emergency department via wheelchair.  Per the cancer Center nurse.  Note: Patient should be considered a full code; since there are no advanced directives in the patient's chart.    Patient stated understanding of all instructions; and was in agreement with this plan of care. The patient knows to call the clinic with any problems, questions or concerns.   Total time spent with patient was 40 minutes;  with greater than 75 percent of that  time spent in face to face counseling regarding patient's symptoms,  and coordination of care and follow up.  Disclaimer:This dictation was prepared with Dragon/digital dictation along with [employer]. Any transcriptional errors that result from this process are unintentional.  Drue Second, NP 11/11/2015  This was a shared visit with Drue Second. 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 She has metastatic rectal cancer and is currently being treated on an immunotherapy trial at [facility]. She presents with acute onset abdominal pain and nausea/vomiting. She reports limited stool output over the past few days. We are concerned she has a bowel obstruction. She will be admitted for further evaluation.  She was last treated with Nivolumab and Ipilumumab on 10/29/2015  Recommendations: 1. Admit for evaluation of possible bowel obstruction 2. Hold Cobimetinib 3. Consult surgery pending ER evaluation  Julieanne Manson, M.D.  Julieanne Manson, M.D.

## 2015-11-11 NOTE — Assessment & Plan Note (Signed)
Patient has mild peripheral edema to bilateral lower extremities; with the left slightly greater than the right.  Patient denies any calf pain.  Patient states that her edema to her bilateral lower extremities is chronic for her.  However,-patient states she's had no Doppler ultrasound studies in the past to determine if she is positive for DVT.  Would recommend Doppler ultrasound to rule out any possible DVT to bilateral lower extremities.

## 2015-11-11 NOTE — H&P (Signed)
Anne [name] is an 70 y.o. female.    General Surgery [facility]  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  Patient has a hx of left colectomy at [facility] two and a half years ago for carcinoma.  She has known pulmonary metastasis and is currently in an immunotherapy trial at [facility].  She is followed locally by Dr. Julieanne Manson.  Additional abdominal surgery includes diverting ileostomy and takedown, and abdominoplasty.  Patient developed abdominal pain and nausea this AM.  She has had at least one previous episode relieved by laxatives.  Patient had plain AXR suspicious for cecal volvulus and then underwent CT scan which is very suspicious of cecal volvulus.  There is no sign of obstruction or ischemia.  Pain has improved and patient states is "2 out of 10" now.  Daughter at bedside.  CT also shows enlargement of pulmonary mets, new adrenal met, and pelvic tumor recurrence.  This was discussed with patient and daughter at time of admission tonight.  Past Medical History:  Diagnosis Date  . Asthma    as a child none since 50 years old  . Colon cancer ([place])    rectal cancer with lung metastasis  . Complication of anesthesia    pt hadpanic attacks with versed, no versed  . Diabetes mellitus without complication ([place])   . Early cataract    stoneburner   . Family history of malignant neoplasm of breast   . Family history of malignant neoplasm of ovary   . Fracture of left foot    2015 trip  . 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 Headache(784.0)   . Heart murmur, systolic 05/31/74   innocent per Dr. Gershon Mussel   . High frequency hearing loss    [facility] followed   . History of renal stone 6 12   dahlstedt  . Hx of colonic  polyps   . Hx of compression fracture of spine 1985   t12 after a  20 foot fall  . Hx of fracture of wrist    after fall   . Hx of hyperglycemia 07/01/2011  . Postmenopausal HRT (hormone replacement therapy) 07/01/2011   surgical menopause  . Skin cancer    multiple  . Status post chemotherapy    completed a total of three cycles of Folfox     Past Surgical History:  Procedure Laterality Date  . ADENOIDECTOMY  1952  . [place]   right [place]  . COLONOSCOPY N/A 10/24/2012   Procedure: COLONOSCOPY;  Surgeon: Irene Shipper, MD;  Location: [facility] ENDOSCOPY;  Service: Endoscopy;  Laterality: N/A;  . COLONOSCOPY N/A 10/25/2012   Procedure: COLONOSCOPY;  Surgeon: Irene Shipper, MD;  Location: [place];  Service: Endoscopy;  Laterality: N/A;  . compression fraction  1985   T12  . COSMETIC SURGERY  1999-2007   holderness blepharoplasty facial rhytidectomy mastopexy abdominoplasty   . DECORTICATION Right 12/21/2012   Procedure: DECORTICATION;  Surgeon: Melrose Nakayama, MD;  Location: [place];  Service: Thoracic;  Laterality: Right;  . dental implant    . DILATION AND CURETTAGE OF UTERUS  1977  . PLEURAL EFFUSION DRAINAGE Right 12/21/2012   Procedure: DRAINAGE OF  PLEURAL EFFUSION;  Surgeon: Loreli Slot, MD;  Location: [facility] OR;  Service: Thoracic;  Laterality: Right;  . PORTACATH PLACEMENT Left 11/21/2012   Procedure: INSERTION PORT-A-CATH;  Surgeon: Loreli Slot, MD;  Location: [facility] OR;  Service: Thoracic;  Laterality: Left;  . THOROCOTOMY WITH LOBECTOMY Right 02/27/2013   Procedure: THOROCOTOMY WITH BI- LOBECTOMY;  Surgeon: Loreli Slot, MD;  Location: [facility] OR;  Service: Thoracic;  Laterality: Right;  . TONSILLECTOMY  1952  . TOTAL ABDOMINAL HYSTERECTOMY  2005   with cystocele repair   . VIDEO ASSISTED THORACOSCOPY Right 12/21/2012   Procedure: VIDEO ASSISTED THORACOSCOPY;  Surgeon: Loreli Slot, MD;  Location: [facility] OR;  Service: Thoracic;   Laterality: Right;  Marland Kitchen VIDEO BRONCHOSCOPY Bilateral 10/27/2012   Procedure: VIDEO BRONCHOSCOPY WITH FLUORO;  Surgeon: Coralyn Helling, MD;  Location: [facility] ENDOSCOPY;  Service: Endoscopy;  Laterality: Bilateral;  . WRIST SURGERY  1957   left reduction    Family History  Problem Relation Age of Onset  . Diabetes Father   . Thyroid disease Father   . Heart attack Father     died age 39  . Skin cancer Father   . Dementia Mother   . Hypertension Mother   . Hyperlipidemia Mother   . Obesity Mother   . Cancer Brother     retro lymphosarcoma agent orange  . Breast cancer Sister 78    BRIP1 pathogenic mutation  . Ovarian cancer Sister 71    currently 49  . Diabetes Brother   . Obesity Brother   . Hypertension Sister   . Asthma Sister   . Cancer Paternal Uncle     unk. primary; deceased 41   Social History:  reports that she has never smoked. She has never used smokeless tobacco. She reports that she drinks about 1.0 oz of alcohol per week . She reports that she does not use drugs.  Allergies:  Allergies  Allergen Reactions  . Ativan [Lorazepam] Anxiety and Other (See Comments)    Reaction:  Panic attacks   . Midazolam Anxiety and Other (See Comments)    Reaction:  Panic attacks   . Zolpidem Tartrate Anxiety and Other (See Comments)    Reaction:  Panic attacks   . Bacitracin Itching and Rash  . Moviprep [Peg-Kcl-Nacl-Nasulf-Na Asc-C] Other (See Comments)    Reaction:  Bloating/GI upset   . Neomycin-Bacitracin Zn-Polymyx Itching and Rash     (Not in a hospital admission)  Results for orders placed or performed during the hospital encounter of 11/11/15 (from the past 48 hour(s))  Urinalysis, Routine w reflex microscopic (not at [facility])     Status: Abnormal   Collection Time: 11/11/15  5:11 PM  Result Value Ref Range   Color, Urine AMBER (A) YELLOW    Comment: BIOCHEMICALS MAY BE AFFECTED BY COLOR   APPearance CLOUDY (A) CLEAR   Specific Gravity, Urine 1.027 1.005 - 1.030   pH 5.5  5.0 - 8.0   Glucose, UA NEGATIVE NEGATIVE mg/dL   Hgb urine dipstick MODERATE (A) NEGATIVE   Bilirubin Urine NEGATIVE NEGATIVE   Ketones, ur 15 (A) NEGATIVE mg/dL   Protein, ur 30 (A) NEGATIVE mg/dL   Nitrite POSITIVE (A) NEGATIVE   Leukocytes, UA TRACE (A) NEGATIVE  Urine microscopic-add on     Status: Abnormal   Collection Time: 11/11/15  5:11 PM  Result Value Ref Range   Squamous Epithelial / LPF 0-5 (A) NONE SEEN   WBC, UA 0-5 0 - 5 WBC/hpf  RBC / HPF 0-5 0 - 5 RBC/hpf   Bacteria, UA MANY (A) NONE SEEN  CBC with Differential/Platelet     Status: Abnormal   Collection Time: 11/11/15  5:54 PM  Result Value Ref Range   WBC 9.1 4.0 - 10.5 K/uL   RBC 4.04 3.87 - 5.11 MIL/uL   Hemoglobin 11.5 (L) 12.0 - 15.0 g/dL   HCT 35.1 (L) 36.0 - 46.0 %   MCV 86.9 78.0 - 100.0 fL   MCH 28.5 26.0 - 34.0 pg   MCHC 32.8 30.0 - 36.0 g/dL   RDW 14.5 11.5 - 15.5 %   Platelets 287 150 - 400 K/uL   Neutrophils Relative % 82 %   Neutro Abs 7.4 1.7 - 7.7 K/uL   Lymphocytes Relative 13 %   Lymphs Abs 1.2 0.7 - 4.0 K/uL   Monocytes Relative 5 %   Monocytes Absolute 0.4 0.1 - 1.0 K/uL   Eosinophils Relative 0 %   Eosinophils Absolute 0.0 0.0 - 0.7 K/uL   Basophils Relative 0 %   Basophils Absolute 0.0 0.0 - 0.1 K/uL  Comprehensive metabolic panel     Status: Abnormal   Collection Time: 11/11/15  5:54 PM  Result Value Ref Range   Sodium 136 135 - 145 mmol/L   Potassium 4.3 3.5 - 5.1 mmol/L   Chloride 101 101 - 111 mmol/L   CO2 27 22 - 32 mmol/L   Glucose, Bld 149 (H) 65 - 99 mg/dL   BUN 19 6 - 20 mg/dL   Creatinine, Ser 0.40 (L) 0.44 - 1.00 mg/dL   Calcium 9.0 8.9 - 10.3 mg/dL   Total Protein 6.7 6.5 - 8.1 g/dL   Albumin 3.7 3.5 - 5.0 g/dL   AST 34 15 - 41 U/L   ALT 26 14 - 54 U/L   Alkaline Phosphatase 56 38 - 126 U/L   Total Bilirubin 0.6 0.3 - 1.2 mg/dL   GFR calc non Af Amer >60 >60 mL/min   GFR calc Af Amer >60 >60 mL/min    Comment: (NOTE) The eGFR has been calculated using the  CKD EPI equation. This calculation has not been validated in all clinical situations. eGFR's persistently <60 mL/min signify possible Chronic Kidney Disease.    Anion gap 8 5 - 15  Lipase, blood     Status: None   Collection Time: 11/11/15  5:54 PM  Result Value Ref Range   Lipase 27 11 - 51 U/L  I-Stat CG4 Lactic Acid, ED     Status: None   Collection Time: 11/11/15  6:03 PM  Result Value Ref Range   Lactic Acid, Venous 0.96 0.5 - 1.9 mmol/L   Ct Abdomen Pelvis W Contrast  Result Date: 11/11/2015 CLINICAL DATA:  Worsening generalized abdominal pain and nausea over past 24 hours. Metastatic rectal carcinoma. EXAM: CT ABDOMEN AND PELVIS WITH CONTRAST TECHNIQUE: Multidetector CT imaging of the abdomen and pelvis was performed using the standard protocol following bolus administration of intravenous contrast. CONTRAST:  126m ISOVUE-300 IOPAMIDOL (ISOVUE-300) INJECTION 61% COMPARISON:  07/25/2013 FINDINGS: Lower chest: Bibasilar pulmonary nodules and masses have increased in size since previous study, consistent with progressive pulmonary metastases. Largest in the medial right lower lobe measures 3.1 x 4.3 cm compared with 1.2 x 1.3 cm previously. New small right pleural effusion also noted. Hepatobiliary: No liver masses identified. Gallbladder is unremarkable. Pancreas: No mass, inflammatory changes, or other significant abnormality. Spleen: Within normal limits in size and appearance. Adrenals/Urinary Tract: New right  adrenal mass measuring 1.9 x 3.3 cm, consistent with adrenal metastasis. Left adrenal or and remains normal in appearance. No evidence of renal masses or hydronephrosis. Unopacified urinary bladder is unremarkable in appearance. Stomach/Bowel: Anorectal surgical anastomosis again seen. Increased rectal wall thickening is seen, with increased perirectal lymphadenopathy, with largest area in the right perirectal region measuring 2.4 x 3.2 cm on image 77/2. This is suspicious for locally  recurrent carcinoma. The cecum is dilated and contains air-fluid level in the right upper quadrant of the abdomen. There is a swirled appearance of the mesenteric in the right abdomen, and this is suspicious for cecal volvulus. No evidence of bowel wall thickening, extraluminal air, or abnormal fluid collections. No evidence of dilated small bowel loops. Vascular/Lymphatic: Perirectal lymphadenopathy, as described above. No other sites of lymphadenopathy identified. No evidence of abdominal aortic aneurysm. Aortic atherosclerosis noted. Reproductive: Prior hysterectomy noted. Adnexal regions are unremarkable in appearance. Other: None. Musculoskeletal: No suspicious bone lesions identified. Superior endplate compression fracture of the L5 vertebral body is seen which is new since previous study in 2015, but is likely chronic. Old T12 vertebral body compression fracture deformity is unchanged. IMPRESSION: Dilated fluid-filled cecum and right upper quadrant abdomen, with swirled appearance of right abdominal mesenteric, suspicious for cecal volvulus. No evidence of bowel wall thickening, extraluminal air, or abscess. Increased rectal wall thickening and perirectal lymphadenopathy, suspicious for local recurrence of carcinoma. New right adrenal mass, highly suspicious for adrenal metastasis. Increased size and number of bibasilar pulmonary nodules and masses, consistent with pulmonary metastases. Electronically Signed   By: Earle Gell M.D.   On: 11/11/2015 20:39   Dg Abd 2 Views  Result Date: 11/11/2015 CLINICAL DATA:  Vomiting and increasing abdominal pain, history of metastatic colon carcinoma EXAM: ABDOMEN - 2 VIEW COMPARISON:  None. FINDINGS: Scattered large and small bowel gas is noted. Some distension of the colon in the midline is noted to 10.8 cm. Air-fluid level is seen suggestive of some obstructive change. Fecal material is noted throughout the colon. No definitive free air is seen. Postsurgical changes  are noted in the right lung base. IMPRESSION: Colonic dilatation in the mid and right upper abdomen. This raises suspicion for possible cecal volvulus. CT may be helpful for further evaluation. No free air is noted. Electronically Signed   By: Inez Catalina M.D.   On: 11/11/2015 13:47    Review of Systems  Constitutional: Negative for chills, diaphoresis and fever.  HENT: Negative.   Eyes: Negative.   Respiratory: Negative.   Cardiovascular: Negative.   Gastrointestinal: Positive for abdominal pain, nausea and vomiting. Negative for blood in stool, constipation and diarrhea.  Genitourinary: Negative.   Musculoskeletal: Negative.   Skin: Negative.   Neurological: Negative.   Endo/Heme/Allergies: Negative.   Psychiatric/Behavioral: Negative.     Blood pressure 132/74, pulse 64, temperature 98.3 F (36.8 C), temperature source Oral, resp. rate 18, SpO2 98 %. Physical Exam  Constitutional: She is oriented to person, place, and time. She appears well-developed and well-nourished. No distress.  HENT:  Head: Normocephalic and atraumatic.  Mouth/Throat: No oropharyngeal exudate.  Eyes: Conjunctivae are normal. Pupils are equal, round, and reactive to light. No scleral icterus.  Neck: Normal range of motion. Neck supple. No tracheal deviation present. No thyromegaly present.  Cardiovascular: Normal rate, regular rhythm and normal heart sounds.   No murmur heard. Respiratory: Effort normal and breath sounds normal. No respiratory distress. She has no wheezes.  Infusion port left upper chest wall, accessed  GI:  Soft. Bowel sounds are normal. She exhibits no distension and no mass. There is tenderness (lower mid abdomen, mild to moderate). There is no rebound and no guarding.  Musculoskeletal: Normal range of motion. She exhibits no edema, tenderness or deformity.  Neurological: She is alert and oriented to person, place, and time.  Skin: Skin is warm and dry. She is not diaphoretic.   Psychiatric: She has a normal mood and affect. Her behavior is normal.     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  Clinically she is stable at this time and does not require immediate operation.  Will repeat abdominal x-ray and evaluate with my partner, Dr. Kaylyn Lim, tomorrow morning.  Will also consult Dr. Julieanne Manson from medical oncology as he knows the patient well.  Operative intervention may be quite complex and may even necessitate ileostomy given multiple resections and pelvic recurrence of carcinoma.  Earnstine Regal, MD, [facility] Office: [place], [place] 11/11/2015, 11:54 PM

## 2015-11-11 NOTE — Assessment & Plan Note (Signed)
Patient reports chronic nausea and decreased oral intake for the past 24 hours.  She feels dehydrated today; and received approximately 1 L normal saline IV fluid rehydration while at the [facility] today.  Patient will be transported to the emergency department for further evaluation and management this afternoon.

## 2015-11-11 NOTE — Assessment & Plan Note (Signed)
Patient reports chronic nausea; but no vomiting for the past 24 hours.  She states that anti--nausea medications have been ineffective for her.  Patient has remained nothing by mouth while in the cancer Center today.  Patient will be transported to the emergency department for further evaluation and management today.

## 2015-11-11 NOTE — Assessment & Plan Note (Signed)
Patient is currently undergoing a clinical trial study at [facility].  Patient is scheduled to return for follow-up visit with Dr. Benay Spice on 12/18/2015.

## 2015-11-11 NOTE — Telephone Encounter (Signed)
Received call from sister Cleda Clarks requesting a call back from nurse about pt's weakness this am.  Spoke with pt and was informed that pt vomited x 2 this am; unable to keep anything down; no po intake for fear of vomiting again.  Pt stated feeling very weak.  Instructed pt to come in at 1030 am for labs and to see Jenny Reichmann, NP symptom management at 11 am today.   Scheduled message sent .  Jenny Reichmann, NP notified. Pt's   Phone    [phone].

## 2015-11-11 NOTE — ED Triage Notes (Signed)
Pt is presented from the cancer center, ongoing treatment for Rectal CA with Mets to lungs, states that she has colon resection and is able to maintain a balance between diarrhea and constipation using different remedies, but recent events affected the balance. Oncology sent her for evaluation with abd CT scan and probable admission.

## 2015-11-11 NOTE — ED Notes (Signed)
Bed: OI51 Expected date:  Expected time:  Means of arrival:  Comments: Ca ctr pt

## 2015-11-11 NOTE — Telephone Encounter (Signed)
Left message for patient re 9/20 f/u per 8/9 los. Schedule mailed. No f/u information in 8/14 los.

## 2015-11-12 ENCOUNTER — Inpatient Hospital Stay (HOSPITAL_COMMUNITY): Payer: Medicare Other

## 2015-11-12 DIAGNOSIS — C7971 Secondary malignant neoplasm of right adrenal gland: Secondary | ICD-10-CM | POA: Diagnosis present

## 2015-11-12 DIAGNOSIS — Z90710 Acquired absence of both cervix and uterus: Secondary | ICD-10-CM | POA: Diagnosis not present

## 2015-11-12 DIAGNOSIS — C7800 Secondary malignant neoplasm of unspecified lung: Secondary | ICD-10-CM

## 2015-11-12 DIAGNOSIS — Z803 Family history of malignant neoplasm of breast: Secondary | ICD-10-CM | POA: Diagnosis not present

## 2015-11-12 DIAGNOSIS — Z86010 Personal history of colonic polyps: Secondary | ICD-10-CM | POA: Diagnosis not present

## 2015-11-12 DIAGNOSIS — K219 Gastro-esophageal reflux disease without esophagitis: Secondary | ICD-10-CM | POA: Diagnosis present

## 2015-11-12 DIAGNOSIS — C20 Malignant neoplasm of rectum: Secondary | ICD-10-CM | POA: Diagnosis present

## 2015-11-12 DIAGNOSIS — Z8249 Family history of ischemic heart disease and other diseases of the circulatory system: Secondary | ICD-10-CM | POA: Diagnosis not present

## 2015-11-12 DIAGNOSIS — Z8041 Family history of malignant neoplasm of ovary: Secondary | ICD-10-CM | POA: Diagnosis not present

## 2015-11-12 DIAGNOSIS — C7970 Secondary malignant neoplasm of unspecified adrenal gland: Secondary | ICD-10-CM | POA: Diagnosis not present

## 2015-11-12 DIAGNOSIS — Z9221 Personal history of antineoplastic chemotherapy: Secondary | ICD-10-CM | POA: Diagnosis not present

## 2015-11-12 DIAGNOSIS — L270 Generalized skin eruption due to drugs and medicaments taken internally: Secondary | ICD-10-CM

## 2015-11-12 DIAGNOSIS — E119 Type 2 diabetes mellitus without complications: Secondary | ICD-10-CM | POA: Diagnosis present

## 2015-11-12 DIAGNOSIS — K562 Volvulus: Principal | ICD-10-CM

## 2015-11-12 DIAGNOSIS — R109 Unspecified abdominal pain: Secondary | ICD-10-CM | POA: Diagnosis present

## 2015-11-12 DIAGNOSIS — H9190 Unspecified hearing loss, unspecified ear: Secondary | ICD-10-CM | POA: Diagnosis present

## 2015-11-12 DIAGNOSIS — R21 Rash and other nonspecific skin eruption: Secondary | ICD-10-CM | POA: Diagnosis present

## 2015-11-12 DIAGNOSIS — Z833 Family history of diabetes mellitus: Secondary | ICD-10-CM | POA: Diagnosis not present

## 2015-11-12 DIAGNOSIS — Z9049 Acquired absence of other specified parts of digestive tract: Secondary | ICD-10-CM | POA: Diagnosis not present

## 2015-11-12 LAB — GLUCOSE, CAPILLARY
GLUCOSE-CAPILLARY: 179 mg/dL — AB (ref 65–99)
Glucose-Capillary: 202 mg/dL — ABNORMAL HIGH (ref 65–99)
Glucose-Capillary: 166 mg/dL — ABNORMAL HIGH (ref 65–99)

## 2015-11-12 LAB — SURGICAL PCR SCREEN
MRSA, PCR: NEGATIVE
Staphylococcus aureus: NEGATIVE

## 2015-11-12 MED ORDER — LINAGLIPTIN 5 MG PO TABS
5.0000 mg | ORAL_TABLET | Freq: Every day | ORAL | Status: DC
Start: 2015-11-12 — End: 2015-11-13
  Administered 2015-11-12: 5 mg via ORAL
  Filled 2015-11-12 (×2): qty 1

## 2015-11-12 MED ORDER — ACETAMINOPHEN 325 MG PO TABS: 650.0000 mg | ORAL_TABLET | Freq: Four times a day (QID) | ORAL | Status: DC | PRN

## 2015-11-12 MED ORDER — HYDROCODONE-ACETAMINOPHEN 5-325 MG PO TABS: 1.0000 | ORAL_TABLET | ORAL | Status: DC | PRN

## 2015-11-12 MED ORDER — ACETAMINOPHEN 650 MG RE SUPP: 650.0000 mg | Freq: Four times a day (QID) | RECTAL | Status: DC | PRN

## 2015-11-12 MED ORDER — KCL IN DEXTROSE-NACL 20-5-0.45 MEQ/L-%-% IV SOLN
INTRAVENOUS | Status: DC
  Administered 2015-11-12: 02:00:00 via INTRAVENOUS
  Filled 2015-11-12 (×3): qty 1000

## 2015-11-12 MED ORDER — PROMETHAZINE HCL 25 MG/ML IJ SOLN
12.5000 mg | Freq: Four times a day (QID) | INTRAMUSCULAR | Status: DC | PRN
  Filled 2015-11-12: qty 1

## 2015-11-12 MED ORDER — POTASSIUM CHLORIDE IN NACL 20-0.9 MEQ/L-% IV SOLN
INTRAVENOUS | Status: DC
  Administered 2015-11-12 – 2015-11-13 (×2): via INTRAVENOUS
  Filled 2015-11-12 (×2): qty 1000

## 2015-11-12 MED ORDER — HYDROMORPHONE HCL 1 MG/ML IJ SOLN: 1.0000 mg | INTRAMUSCULAR | Status: DC | PRN

## 2015-11-12 MED ORDER — INSULIN ASPART 100 UNIT/ML ~~LOC~~ SOLN: 0.0000 [IU] | Freq: Three times a day (TID) | SUBCUTANEOUS | Status: DC

## 2015-11-12 MED ORDER — INSULIN ASPART 100 UNIT/ML ~~LOC~~ SOLN: 0.0000 [IU] | SUBCUTANEOUS | Status: DC

## 2015-11-12 MED ORDER — AMLODIPINE BESYLATE 10 MG PO TABS
10.0000 mg | ORAL_TABLET | Freq: Two times a day (BID) | ORAL | Status: DC
  Administered 2015-11-12: 10 mg via ORAL
  Filled 2015-11-12: qty 1

## 2015-11-12 NOTE — Progress Notes (Signed)
Subjective: Anne [name] feels better no BM minimal "baby flatus," no distension or pain currently.  Anne [name] has BM 1-2 times per week as her normal. Anne [name] says it is soft and this is her normal.    Objective: Vital signs in last 24 hours: Temp:  [97.5 F (36.4 C)-98.5 F (36.9 C)] 97.6 F (36.4 C) (08/15 0922) Pulse Rate:  [64-95] 70 (08/15 0922) Resp:  [16-20] 16 (08/15 0922) BP: (122-151)/(56-78) 136/64 (08/15 0922) SpO2:  [97 %-100 %] 97 % (08/15 0922) Weight:  [56.6 kg (124 lb 12.8 oz)-58.1 kg (128 lb 1.4 oz)] 58.1 kg (128 lb 1.4 oz) (08/15 0135) Last BM Date:  (pre admission) Urine 1100 Afebrile, VSS No labs this Am Film this AM:  No acute abnormality. No evidence of bowel distention. No cecal distention noted on today's exam. Oral contrast noted in the right colon. Follow-up abdominal exam can be obtained to demonstrate oral contrast in the left colon and rectum to ensure the absence of colonic obstruction .   Intake/Output from previous day: 08/14 0701 - 08/15 0700 In: 60 [P.O.:60] Out: 1100 [Urine:1100] Intake/Output this shift: Total I/O In: 0  Out: 400 [Urine:400]  General appearance: alert, cooperative and no distress GI: soft, non-tender; bowel sounds normal; no masses,  no organomegaly  Lab Results:   Recent Labs  11/11/15 1043 11/11/15 1754  WBC 9.5 9.1  HGB 12.6 11.5*  HCT 39.0 35.1*  PLT 322 287    BMET  Recent Labs  11/11/15 1043 11/11/15 1754  NA 137 136  K 3.7 4.3  CL  --  101  CO2 27 27  GLUCOSE 243* 149*  BUN 17.0 19  CREATININE 0.7 0.40*  CALCIUM 10.3 9.0   PT/INR No results for input(s): LABPROT, INR in the last 72 hours.   Recent Labs Lab 11/11/15 1043 11/11/15 1754  AST 35* 34  ALT 22 26  ALKPHOS 67 56  BILITOT 0.30 0.6  PROT 7.3 6.7  ALBUMIN 3.4* 3.7     Lipase     Component Value Date/Time   LIPASE 27 11/11/2015 1754     Studies/Results: Ct Abdomen Pelvis W Contrast  Result Date: 11/11/2015 CLINICAL DATA:   Worsening generalized abdominal pain and nausea over past 24 hours. Metastatic rectal carcinoma. EXAM: CT ABDOMEN AND PELVIS WITH CONTRAST TECHNIQUE: Multidetector CT imaging of the abdomen and pelvis was performed using the standard protocol following bolus administration of intravenous contrast. CONTRAST:  133m ISOVUE-300 IOPAMIDOL (ISOVUE-300) INJECTION 61% COMPARISON:  07/25/2013 FINDINGS: Lower chest: Bibasilar pulmonary nodules and masses have increased in size since previous study, consistent with progressive pulmonary metastases. Largest in the medial right lower lobe measures 3.1 x 4.3 cm compared with 1.2 x 1.3 cm previously. New small right pleural effusion also noted. Hepatobiliary: No liver masses identified. Gallbladder is unremarkable. Pancreas: No mass, inflammatory changes, or other significant abnormality. Spleen: Within normal limits in size and appearance. 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 Left adrenal or and remains normal in appearance. No evidence of renal masses or hydronephrosis. Unopacified urinary bladder is unremarkable in appearance. Stomach/Bowel: Anorectal surgical anastomosis again seen. Increased rectal wall thickening is seen, with increased perirectal lymphadenopathy, with largest area in the right perirectal region measuring 2.4 x 3.2 cm on image 77/2. This is suspicious for locally recurrent carcinoma. The cecum is dilated and contains air-fluid level in the right upper quadrant of the abdomen. There is a swirled appearance of the mesenteric in  the right abdomen, and this is suspicious for cecal volvulus. No evidence of bowel wall thickening, extraluminal air, or abnormal fluid collections. No evidence of dilated small bowel loops. Vascular/Lymphatic: Perirectal lymphadenopathy, as described above. No other sites of lymphadenopathy identified. No evidence of abdominal aortic aneurysm. Aortic atherosclerosis  noted. Reproductive: Prior hysterectomy noted. Adnexal regions are unremarkable in appearance. Other: None. Musculoskeletal: No suspicious bone lesions identified. Superior endplate compression fracture of the L5 vertebral body is seen which is new since previous study in 2015, but is likely chronic. Old T12 vertebral body compression fracture deformity is unchanged. IMPRESSION: Dilated fluid-filled cecum and right upper quadrant abdomen, with swirled appearance of right abdominal mesenteric, suspicious for cecal volvulus. No evidence of bowel wall thickening, extraluminal air, or abscess. Increased rectal wall thickening and perirectal lymphadenopathy, suspicious for local recurrence of carcinoma. New right adrenal mass, highly suspicious for adrenal metastasis. Increased size and number of bibasilar pulmonary nodules and masses, consistent with pulmonary metastases. Electronically Signed   By: Earle Gell M.D.   On: 11/11/2015 20:39   Dg Abd 2 Views  Result Date: 11/11/2015 CLINICAL DATA:  Vomiting and increasing abdominal pain, history of metastatic colon carcinoma EXAM: ABDOMEN - 2 VIEW COMPARISON:  None. FINDINGS: Scattered large and small bowel gas is noted. Some distension of the colon in the midline is noted to 10.8 cm. Air-fluid level is seen suggestive of some obstructive change. Fecal material is noted throughout the colon. No definitive free air is seen. Postsurgical changes are noted in the right lung base. IMPRESSION: Colonic dilatation in the mid and right upper abdomen. This raises suspicion for possible cecal volvulus. CT may be helpful for further evaluation. No free air is noted. Electronically Signed   By: Inez Catalina M.D.   On: 11/11/2015 13:47   Dg Abd Portable 2v  Result Date: 11/12/2015 CLINICAL DATA:  Colorectal cancer. EXAM: PORTABLE ABDOMEN - 2 VIEW COMPARISON:  CT 11/11/2015. FINDINGS: Soft tissue structures are unremarkable. No bowel distention. No free air. Stool noted  throughout the colon. Degenerative changes lumbar spine and both hips. Pelvic calcifications noted most consistent phleboliths. Oral contrast noted in the colon from prior CT. IV contrast in the bladder from prior CT. IMPRESSION: No acute abnormality. No evidence of bowel distention. No cecal distention noted on today's exam. Oral contrast noted in the right colon. Follow-up abdominal exam can be obtained to demonstrate oral contrast in the left colon and rectum to ensure the absence of colonic obstruction . Electronically Signed   By: Marcello Moores  Register   On: 11/12/2015 09:33   Prior to Admission medications   Medication Sig Start Date End Date Taking? Authorizing Provider  amLODipine (NORVASC) 10 MG tablet Take 10 mg by mouth daily.   Yes Historical Provider, MD  carbamide peroxide (DEBROX) 6.5 % otic solution Place 5 drops into both ears 2 (two) times daily as needed (for wax removal).    Yes Historical Provider, MD  cobimetinib fumarate (COTELLIC) 20 MG tablet Take 60 mg by mouth daily.    Yes Historical Provider, MD  diphenhydrAMINE (BENADRYL) 25 mg capsule Take 50 mg by mouth every 6 (six) hours as needed for itching, allergies or sleep.   Yes Historical Provider, MD  estradiol (MINIVELLE) 0.0375 MG/24HR Place 1 patch onto the skin 2 (two) times a week.    Yes Historical Provider, MD  estrogen-methylTESTOSTERone (ESTRATEST) 1.25-2.5 MG tablet Take 1 tablet by mouth daily.   Yes Historical Provider, MD  hydrochlorothiazide (MICROZIDE) 12.5 MG  capsule Take 12.5 mg by mouth daily.   Yes Historical Provider, MD  hydrocortisone 2.5 % cream Apply 1 application topically 2 (two) times daily as needed (for itching).    Yes Historical Provider, MD  loperamide (IMODIUM) 2 MG capsule Take 2 mg by mouth as needed for diarrhea or loose stools.   Yes Historical Provider, MD  loratadine (CLARITIN) 10 MG tablet Take 10 mg by mouth daily.   Yes Historical Provider, MD  Melatonin 3 MG TABS Take 3 mg by mouth at  bedtime as needed (for sleep).   Yes Historical Provider, MD  metFORMIN (GLUCOPHAGE) 1000 MG tablet Take 1,000 mg by mouth 2 (two) times daily with a meal.   Yes Historical Provider, MD  methylphenidate 27 MG PO CR tablet Take 1 tablet (27 mg total) by mouth daily. 10/08/15  Yes Ladell Pier, MD  Multiple Vitamin (MULTIVITAMIN WITH MINERALS) TABS tablet Take 1 tablet by mouth daily.   Yes Historical Provider, MD  Naphazoline-Pheniramine (OPCON-A) 0.027-0.315 % SOLN Place 1 drop into both eyes daily.   Yes Historical Provider, MD  omeprazole (PRILOSEC) 20 MG capsule Take 20 mg by mouth daily.    Yes Historical Provider, MD  oxyCODONE-acetaminophen (PERCOCET) 10-325 MG tablet Take 1 tablet by mouth every 4 (four) hours as needed for pain. 11/06/15  Yes Owens Shark, NP  sitaGLIPtin (JANUVIA) 100 MG tablet Take 100 mg by mouth daily.   Yes Historical Provider, MD  triamcinolone cream (KENALOG) 0.5 % Apply 1 application topically 2 (two) times daily.   Yes Historical Provider, MD  ondansetron (ZOFRAN) 8 MG tablet Take 1 tablet (8 mg total) by mouth every 8 (eight) hours as needed for nausea or vomiting. Patient not taking: Reported on 11/11/2015 04/08/15   Ladell Pier, MD     Medications: . diatrizoate meglumine-sodium  30 mL Oral Once   . 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 Learta Codding - He knows of admission   Plan:  Will review withDr. Hassell Done. Check on follow-up film, and possibly starting clears. Anne [name] supposed go back on immunotherapy at [facility] today.   LOS: 0 days    Anne [name] 11/12/2015 [phone]

## 2015-11-12 NOTE — Progress Notes (Signed)
IP PROGRESS NOTE  Subjective:   She reports feeling much better today. No pain or nausea. No flatus or bowel movement.  Objective: Vital signs in last 24 hours: Blood pressure 136/64, pulse 70, temperature 97.6 F (36.4 C), temperature source Oral, resp. rate 16, height '5\' 4"'$  (1.626 m), weight 128 lb 1.4 oz (58.1 kg), SpO2 97 %.  Intake/Output from previous day: 08/14 0701 - 08/15 0700 In: 86 [P.O.:60] Out: 1100 [Urine:1100]  Physical Exam:  Lungs: Clear bilaterally Cardiac: Regular rate and rhythm Abdomen: Soft and nontender, no mass, nondistended Extremities: Trace edema at the left lower leg and foot Skin: Mild erythematous rash over the trunk and extremities  Portacath/PICC-without erythema  Lab Results:  Recent Labs  11/11/15 1043 11/11/15 1754  WBC 9.5 9.1  HGB 12.6 11.5*  HCT 39.0 35.1*  PLT 322 287    BMET  Recent Labs  11/11/15 1043 11/11/15 1754  NA 137 136  K 3.7 4.3  CL  --  101  CO2 27 27  GLUCOSE 243* 149*  BUN 17.0 19  CREATININE 0.7 0.40*  CALCIUM 10.3 9.0    Studies/Results: Ct Abdomen Pelvis W Contrast  Result Date: 11/11/2015 CLINICAL DATA:  Worsening generalized abdominal pain and nausea over past 24 hours. Metastatic rectal carcinoma. EXAM: CT ABDOMEN AND PELVIS WITH CONTRAST TECHNIQUE: Multidetector CT imaging of the abdomen and pelvis was performed using the standard protocol following bolus administration of intravenous contrast. CONTRAST:  140m ISOVUE-300 IOPAMIDOL (ISOVUE-300) INJECTION 61% COMPARISON:  07/25/2013 FINDINGS: Lower chest: Bibasilar pulmonary nodules and masses have increased in size since previous study, consistent with progressive pulmonary metastases. Largest in the medial right lower lobe measures 3.1 x 4.3 cm compared with 1.2 x 1.3 cm previously. New small right pleural effusion also noted. Hepatobiliary: No liver masses identified. Gallbladder is unremarkable. Pancreas: No mass, inflammatory changes, or other  significant abnormality. Spleen: Within normal limits in size and appearance. 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 Left adrenal or and remains normal in appearance. No evidence of renal masses or hydronephrosis. Unopacified urinary bladder is unremarkable in appearance. Stomach/Bowel: Anorectal surgical anastomosis again seen. Increased rectal wall thickening is seen, with increased perirectal lymphadenopathy, with largest area in the right perirectal region measuring 2.4 x 3.2 cm on image 77/2. This is suspicious for locally recurrent carcinoma. The cecum is dilated and contains air-fluid level in the right upper quadrant of the abdomen. There is a swirled appearance of the mesenteric in the right abdomen, and this is suspicious for cecal volvulus. No evidence of bowel wall thickening, extraluminal air, or abnormal fluid collections. No evidence of dilated small bowel loops. Vascular/Lymphatic: Perirectal lymphadenopathy, as described above. No other sites of lymphadenopathy identified. No evidence of abdominal aortic aneurysm. Aortic atherosclerosis noted. Reproductive: Prior hysterectomy noted. Adnexal regions are unremarkable in appearance. Other: None. Musculoskeletal: No suspicious bone lesions identified. Superior endplate compression fracture of the L5 vertebral body is seen which is new since previous study in 2015, but is likely chronic. Old T12 vertebral body compression fracture deformity is unchanged. IMPRESSION: Dilated fluid-filled cecum and right upper quadrant abdomen, with swirled appearance of right abdominal mesenteric, suspicious for cecal volvulus. No evidence of bowel wall thickening, extraluminal air, or abscess. Increased rectal wall thickening and perirectal lymphadenopathy, suspicious for local recurrence of carcinoma. New right adrenal mass, highly suspicious for adrenal metastasis. Increased size and number of  bibasilar pulmonary nodules and masses, consistent with pulmonary metastases. Electronically  Signed   By: Earle Gell M.D.   On: 11/11/2015 20:39   Dg Abd 2 Views  Result Date: 11/11/2015 CLINICAL DATA:  Vomiting and increasing abdominal pain, history of metastatic colon carcinoma EXAM: ABDOMEN - 2 VIEW COMPARISON:  None. FINDINGS: Scattered large and small bowel gas is noted. Some distension of the colon in the midline is noted to 10.8 cm. Air-fluid level is seen suggestive of some obstructive change. Fecal material is noted throughout the colon. No definitive free air is seen. Postsurgical changes are noted in the right lung base. IMPRESSION: Colonic dilatation in the mid and right upper abdomen. This raises suspicion for possible cecal volvulus. CT may be helpful for further evaluation. No free air is noted. Electronically Signed   By: Inez Catalina M.D.   On: 11/11/2015 13:47   Dg Abd Portable 2v  Result Date: 11/12/2015 CLINICAL DATA:  Colorectal cancer. EXAM: PORTABLE ABDOMEN - 2 VIEW COMPARISON:  CT 11/11/2015. FINDINGS: Soft tissue structures are unremarkable. No bowel distention. No free air. Stool noted throughout the colon. Degenerative changes lumbar spine and both hips. Pelvic calcifications noted most consistent phleboliths. Oral contrast noted in the colon from prior CT. IV contrast in the bladder from prior CT. IMPRESSION: No acute abnormality. No evidence of bowel distention. No cecal distention noted on today's exam. Oral contrast noted in the right colon. Follow-up abdominal exam can be obtained to demonstrate oral contrast in the left colon and rectum to ensure the absence of colonic obstruction . Electronically Signed   By: Marcello Moores  Register   On: 11/12/2015 09:33    Medications: I have reviewed the patient's current medications.  Assessment/Plan:  1. Metastatic rectal cancer, currently being treated on protocol at [facility] with nivolumab,ipilumumab, and cobimetinib 2. Admission  11/11/2015 with acute onset nausea/vomiting and abdominal pain-CT suspicious for cecum volvulus, clinical and x-ray improvement today 3. Rash secondary to systemic therapy  Her clinical status appears much improved today. She appears to have a transient bowel obstruction or volvulus. She is being evaluated by the surgical service. The CT from 11/11/2015 does not appear significantly changed from recent CTs at [facility] based on review of the imaging reports. She has known lung and adrenal metastases. We will need to look at the most recent [facility] study for a direct comparison  as perirectal lymphadenopathy has not been mentioned on studies there.  Recommendations: 1. Management of bowel obstruction per surgery 2. Resume treatment on protocol at [facility] if she remains well over the next few days.   LOS: 0 days   Betsy Coder, MD   11/12/2015, 10:34 AM

## 2015-11-12 NOTE — ED Provider Notes (Signed)
[facility] DEPT Provider Note   CSN: [id number] Arrival date & time: 11/11/15  1618     History   Chief Complaint Chief Complaint  Patient presents with  . Abdominal Pain  . Constipation  . Diarrhea  . Oncology Pt    HPI Anne [name] is a 70 y.o. female.  She is a retired Corporate treasurer. She has a history of metastatic rectal cancer. Status post low anterior resection left hemicolectomy. Known pulmonary intraperitoneal metastases. Following with oncology. She just returned from a 2 week trip to [place]. Did well. Return 5 days ago. Today had an episode of severe right upper quadrant pain and nausea. Was seen at the cancer center and received IV pain medicine and had a abdominal plain film which showed an air-fluid level she was referred to the emergency room. She presents here completely asymptomatic. Denies nausea vomiting or pain. She had an episode of pain earlier that has resolved.  HPI  Past Medical History:  Diagnosis Date  . Asthma    as a child none since 70 years old  . Colon cancer ([place])    rectal cancer with lung metastasis  . Complication of anesthesia    pt hadpanic attacks with versed, no versed  . Diabetes mellitus without complication ([place])   . Early cataract    stoneburner   . Family history of malignant neoplasm of breast   . Family history of malignant neoplasm of ovary   . Fracture of left foot    2015 trip  . 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 Headache(784.0)   . Heart murmur, systolic 06/03/1060   innocent per Dr. Gershon Mussel   . High frequency hearing loss    kraus followed   . History of renal stone 6 12   d[place]stedt  . Hx of colonic polyps   . Hx of compression fracture of spine 1985   t12 after a  20 foot fall  . Hx of fracture of wrist    after fall   . Hx of hyperglycemia 07/01/2011  .  Postmenopausal HRT (hormone replacement therapy) 07/01/2011   surgical menopause  . Skin cancer    multiple  . Status post chemotherapy    completed a total of three cycles of Folfox     Patient Active Problem List   Diagnosis Date Noted  . Nausea without vomiting 11/11/2015  . Peripheral edema 11/11/2015  . 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 Local recurrence of rectal cancer ([place]) 11/11/2015  . Anorexia 09/12/2014  . Weight loss 09/12/2014  . Neoplastic malignant related fatigue 09/12/2014  . Dehydration 09/12/2014  . Type 2 diabetes mellitus without complication ([place]) 69/48/5462  . Essential hypertension 01/05/2014  . 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 Family history of malignant neoplasm of breast   . Family history of malignant neoplasm of ovary   . Abdominal pain 05/12/2013  . Left upper arm pain 03/21/2013  . Shortness of breath 03/21/2013  . S/P thoracotomy 02/27/2013  . Elevated blood pressure reading 11/02/2012  . Rectal cancer metastasized to lung ([place]) 11/01/2012  . DM type 2 (diabetes mellitus, type 2) ([place]) 10/23/2012  . Hx of colonic polyps   . Early cataract   . Heart  murmur, systolic 07/01/2011  . Chest pain, unspecified 07/01/2011  . Postmenopausal HRT (hormone replacement therapy) 07/01/2011  . Hx of hyperglycemia 07/01/2011  . High frequency hearing loss 07/01/2011    Past Surgical History:  Procedure Laterality Date  . ADENOIDECTOMY  1952  . ANTERIOR CRUCIATE LIGAMENT REPAIR  1993   right [place]  . COLONOSCOPY N/A 10/24/2012   Procedure: COLONOSCOPY;  Surgeon: Hilarie Fredrickson, MD;  Location: [facility] ENDOSCOPY;  Service: Endoscopy;  Laterality: N/A;  . COLONOSCOPY N/A 10/25/2012   Procedure: COLONOSCOPY;  Surgeon: Hilarie Fredrickson, MD;  Location: [facility] ENDOSCOPY;  Service: Endoscopy;  Laterality: N/A;  . compression fraction  1985   T12  .  COSMETIC SURGERY  1999-2007   holderness blepharoplasty facial rhytidectomy mastopexy abdominoplasty   . DECORTICATION Right 12/21/2012   Procedure: DECORTICATION;  Surgeon: Loreli Slot, MD;  Location: [facility] OR;  Service: Thoracic;  Laterality: Right;  . dental implant    . DILATION AND CURETTAGE OF UTERUS  1977  . PLEURAL EFFUSION DRAINAGE Right 12/21/2012   Procedure: DRAINAGE OF PLEURAL EFFUSION;  Surgeon: Loreli Slot, MD;  Location: [facility] OR;  Service: Thoracic;  Laterality: Right;  . PORTACATH PLACEMENT Left 11/21/2012   Procedure: INSERTION PORT-A-CATH;  Surgeon: Loreli Slot, MD;  Location: [facility] OR;  Service: Thoracic;  Laterality: Left;  . THOROCOTOMY WITH LOBECTOMY Right 02/27/2013   Procedure: THOROCOTOMY WITH BI- LOBECTOMY;  Surgeon: Loreli Slot, MD;  Location: [facility] OR;  Service: Thoracic;  Laterality: Right;  . TONSILLECTOMY  1952  . TOTAL ABDOMINAL HYSTERECTOMY  2005   with cystocele repair   . VIDEO ASSISTED THORACOSCOPY Right 12/21/2012   Procedure: VIDEO ASSISTED THORACOSCOPY;  Surgeon: Loreli Slot, MD;  Location[facility] OR;  Service: Thoracic;  Laterality: Right;  Marland Kitchen VIDEO BRONCHOSCOPY Bilateral 10/27/2012   Procedure: VIDEO BRONCHOSCOPY WITH FLUORO;  Surgeon: Coralyn Helling, MD;  Location: [facility] ENDOSCOPY;  Service: Endoscopy;  Laterality: Bilateral;  . WRIST SURGERY  1957   left reduction    OB History    No data available       Home Medications    Prior to Admission medications   Medication Sig Start Date End Date Taking? Authorizing Provider  amLODipine (NORVASC) 10 MG tablet Take 10 mg by mouth daily.   Yes Historical Provider, MD  carbamide peroxide (DEBROX) 6.5 % otic solution Place 5 drops into both ears 2 (two) times daily as needed (for wax removal).    Yes Historical Provider, MD  cobimetinib fumarate (COTELLIC) 20 MG tablet Take 60 mg by mouth daily.    Yes Historical Provider, MD  diphenhydrAMINE (BENADRYL) 25 mg capsule Take 50 mg by  mouth every 6 (six) hours as needed for itching, allergies or sleep.   Yes Historical Provider, MD  estradiol (MINIVELLE) 0.0375 MG/24HR Place 1 patch onto the skin 2 (two) times a week.    Yes Historical Provider, MD  estrogen-methylTESTOSTERone (ESTRATEST) 1.25-2.5 MG tablet Take 1 tablet by mouth daily.   Yes Historical Provider, MD  hydrochlorothiazide (MICROZIDE) 12.5 MG capsule Take 12.5 mg by mouth daily.   Yes Historical Provider, MD  hydrocortisone 2.5 % cream Apply 1 application topically 2 (two) times daily as needed (for itching).    Yes Historical Provider, MD  loperamide (IMODIUM) 2 MG capsule Take 2 mg by mouth as needed for diarrhea or loose stools.   Yes Historical Provider, MD  loratadine (CLARITIN) 10 MG tablet Take 10 mg by mouth daily.   Yes  Historical Provider, MD  Melatonin 3 MG TABS Take 3 mg by mouth at bedtime as needed (for sleep).   Yes Historical Provider, MD  metFORMIN (GLUCOPHAGE) 1000 MG tablet Take 1,000 mg by mouth 2 (two) times daily with a meal.   Yes Historical Provider, MD  methylphenidate 27 MG PO CR tablet Take 1 tablet (27 mg total) by mouth daily. 10/08/15  Yes Ladell Pier, MD  Multiple Vitamin (MULTIVITAMIN WITH MINERALS) TABS tablet Take 1 tablet by mouth daily.   Yes Historical Provider, MD  Naphazoline-Pheniramine (OPCON-A) 0.027-0.315 % SOLN Place 1 drop into both eyes daily.   Yes Historical Provider, MD  omeprazole (PRILOSEC) 20 MG capsule Take 20 mg by mouth daily.    Yes Historical Provider, MD  oxyCODONE-acetaminophen (PERCOCET) 10-325 MG tablet Take 1 tablet by mouth every 4 (four) hours as needed for pain. 11/06/15  Yes Owens Shark, NP  sitaGLIPtin (JANUVIA) 100 MG tablet Take 100 mg by mouth daily.   Yes Historical Provider, MD  triamcinolone cream (KENALOG) 0.5 % Apply 1 application topically 2 (two) times daily.   Yes Historical Provider, MD  ondansetron (ZOFRAN) 8 MG tablet Take 1 tablet (8 mg total) by mouth every 8 (eight) hours as needed  for nausea or vomiting. Patient not taking: Reported on 11/11/2015 04/08/15   Ladell Pier, MD    Family History Family History  Problem Relation Age of Onset  . Diabetes Father   . Thyroid disease Father   . Heart attack Father     died age 38  . Skin cancer Father   . Dementia Mother   . Hypertension Mother   . Hyperlipidemia Mother   . Obesity Mother   . Cancer Brother     retro lymphosarcoma agent orange  . Breast cancer Sister 59    BRIP1 pathogenic mutation  . Ovarian cancer Sister 55    currently 2  . Diabetes Brother   . Obesity Brother   . Hypertension Sister   . Asthma Sister   . Cancer Paternal Uncle     unk. primary; deceased 2    Social History Social History  Substance Use Topics  . Smoking status: Never Smoker  . Smokeless tobacco: Never Used  . Alcohol use 1.0 oz/week    2 Standard drinks or equivalent per week     Comment: occ     Allergies   Ativan [lorazepam]; Midazolam; Zolpidem tartrate; Bacitracin; Moviprep [peg-kcl-nacl-nasulf-na asc-c]; and Neomycin-bacitracin zn-polymyx   Review of Systems Review of Systems  Constitutional: Negative for appetite change, chills, diaphoresis, fatigue and fever.  HENT: Negative for mouth sores, sore throat and trouble swallowing.   Eyes: Negative for visual disturbance.  Respiratory: Negative for cough, chest tightness, shortness of breath and wheezing.   Cardiovascular: Negative for chest pain.  Gastrointestinal: Positive for abdominal pain and nausea. Negative for abdominal distention, diarrhea and vomiting.  Endocrine: Negative for polydipsia, polyphagia and polyuria.  Genitourinary: Negative for dysuria, frequency and hematuria.  Musculoskeletal: Negative for gait problem.  Skin: Negative for color change, pallor and rash.  Neurological: Negative for dizziness, syncope, light-headedness and headaches.  Hematological: Does not bruise/bleed easily.  Psychiatric/Behavioral: Negative for behavioral  problems and confusion.     Physical Exam Updated Vital Signs BP 132/74 (BP Location: Left Arm)   Pulse 64   Temp 98.3 F (36.8 C) (Oral)   Resp 18   SpO2 98%   Physical Exam  Constitutional: She is oriented to person, place, and  time. She appears well-developed and well-nourished.  HENT:  Head: Normocephalic.  Eyes: Conjunctivae are normal. Pupils are equal, round, and reactive to light. No scleral icterus.  Neck: Normal range of motion. Neck supple. No thyromegaly present.  Cardiovascular: Normal rate and regular rhythm.  Exam reveals no gallop and no friction rub.   No murmur heard. Pulmonary/Chest: Effort normal and breath sounds normal. No respiratory distress. She has no wheezes. She has no rales.  Abdominal: Soft. Bowel sounds are normal. She exhibits no distension. There is no tenderness. There is no rebound.  Soft benign. Normal active bowel sounds. No tenderness. No peritoneal irritation.  Musculoskeletal: Normal range of motion.  Neurological: She is alert and oriented to person, place, and time.  Skin: Skin is warm and dry. No rash noted.  Psychiatric: She has a normal mood and affect. Her behavior is normal.     ED Treatments / Results  Labs (all labs ordered are listed, but only abnormal results are displayed) Labs Reviewed  CBC WITH DIFFERENTIAL/PLATELET - Abnormal; Notable for the following:       Result Value   Hemoglobin 11.5 (*)    HCT 35.1 (*)    All other components within normal limits  URINALYSIS, ROUTINE W REFLEX MICROSCOPIC (NOT AT [facility]) - Abnormal; Notable for the following:    Color, Urine AMBER (*)    APPearance CLOUDY (*)    Hgb urine dipstick MODERATE (*)    Ketones, ur 15 (*)    Protein, ur 30 (*)    Nitrite POSITIVE (*)    Leukocytes, UA TRACE (*)    All other components within normal limits  COMPREHENSIVE METABOLIC PANEL - Abnormal; Notable for the following:    Glucose, Bld 149 (*)    Creatinine, Ser 0.40 (*)    All other  components within normal limits  URINE MICROSCOPIC-ADD ON - Abnormal; Notable for the following:    Squamous Epithelial / LPF 0-5 (*)    Bacteria, UA MANY (*)    All other components within normal limits  LIPASE, BLOOD  I-STAT CG4 LACTIC ACID, ED    EKG  EKG Interpretation None       Radiology Ct Abdomen Pelvis W Contrast  Result Date: 11/11/2015 CLINICAL DATA:  Worsening generalized abdominal pain and nausea over past 24 hours. Metastatic rectal carcinoma. EXAM: CT ABDOMEN AND PELVIS WITH CONTRAST TECHNIQUE: Multidetector CT imaging of the abdomen and pelvis was performed using the standard protocol following bolus administration of intravenous contrast. CONTRAST:  ISOVUE-300 IOPAMIDOL (ISOVUE-300) INJECTION 61% COMPARISON:  07/25/2013 FINDINGS: Lower chest: Bibasilar pulmonary nodules and masses have increased in size since previous study, consistent with progressive pulmonary metastases. Largest in the medial right lower lobe measures 3.1 x 4.3 cm compared with 1.2 x 1.3 cm previously. New small right pleural effusion also noted. Hepatobiliary: No liver masses identified. Gallbladder is unremarkable. Pancreas: No mass, inflammatory changes, or other significant abnormality. Spleen: Within normal limits in size and appearance. 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 Left adrenal or and remains normal in appearance. No evidence of renal masses or hydronephrosis. Unopacified urinary bladder is unremarkable in appearance. Stomach/Bowel: Anorectal surgical anastomosis again seen. Increased rectal wall thickening is seen, with increased perirectal lymphadenopathy, with largest area in the right perirectal region measuring 2.4 x 3.2 cm on image 77/2. This is suspicious for locally recurrent carcinoma. The cecum is dilated and contains air-fluid level in the right upper quadrant of  the abdomen. There is a swirled appearance of the  mesenteric in the right abdomen, and this is suspicious for cecal volvulus. No evidence of bowel wall thickening, extraluminal air, or abnormal fluid collections. No evidence of dilated small bowel loops. Vascular/Lymphatic: Perirectal lymphadenopathy, as described above. No other sites of lymphadenopathy identified. No evidence of abdominal aortic aneurysm. Aortic atherosclerosis noted. Reproductive: Prior hysterectomy noted. Adnexal regions are unremarkable in appearance. Other: None. Musculoskeletal: No suspicious bone lesions identified. Superior endplate compression fracture of the L5 vertebral body is seen which is new since previous study in 2015, but is likely chronic. Old T12 vertebral body compression fracture deformity is unchanged. IMPRESSION: Dilated fluid-filled cecum and right upper quadrant abdomen, with swirled appearance of right abdominal mesenteric, suspicious for cecal volvulus. No evidence of bowel wall thickening, extraluminal air, or abscess. Increased rectal wall thickening and perirectal lymphadenopathy, suspicious for local recurrence of carcinoma. New right adrenal mass, highly suspicious for adrenal metastasis. Increased size and number of bibasilar pulmonary nodules and masses, consistent with pulmonary metastases. Electronically Signed   By: Earle Gell M.D.   On: 11/11/2015 20:39   Dg Abd 2 Views  Result Date: 11/11/2015 CLINICAL DATA:  Vomiting and increasing abdominal pain, history of metastatic colon carcinoma EXAM: ABDOMEN - 2 VIEW COMPARISON:  None. FINDINGS: Scattered large and small bowel gas is noted. Some distension of the colon in the midline is noted to 10.8 cm. Air-fluid level is seen suggestive of some obstructive change. Fecal material is noted throughout the colon. No definitive free air is seen. Postsurgical changes are noted in the right lung base. IMPRESSION: Colonic dilatation in the mid and right upper abdomen. This raises suspicion for possible cecal volvulus.  CT may be helpful for further evaluation. No free air is noted. Electronically Signed   By: Inez Catalina M.D.   On: 11/11/2015 13:47    Procedures Procedures (including critical care time)  Medications Ordered in ED Medications  diatrizoate meglumine-sodium (GASTROGRAFIN) 66-10 % solution 30 mL (not administered)  dextrose 5 % and 0.45 % NaCl with KCl 20 mEq/L infusion (not administered)  acetaminophen (TYLENOL) tablet 650 mg (not administered)    Or  acetaminophen (TYLENOL) suppository 650 mg (not administered)  HYDROcodone-acetaminophen (NORCO/VICODIN) 5-325 MG per tablet 1-2 tablet (not administered)  HYDROmorphone (DILAUDID) injection 1 mg (not administered)  promethazine (PHENERGAN) injection 12.5 mg (not administered)  iopamidol (ISOVUE-300) 61 % injection 100 mL (100 mLs Intravenous Contrast Given 11/11/15 1942)     Initial Impression / Assessment and Plan / ED Course  I have reviewed the triage vital signs and the nursing notes.  Pertinent labs & imaging results that were available during my care of the patient were reviewed by me and considered in my medical decision making (see chart for details).  Clinical Course    Clinically patient has no tenderness has a benign abdominal exam. Really is only given dressed that she feels fine. I did review her abdominal plain films which were obtained today which do show a right upper quadrant air-fluid level. CT scan was obtained and shows swirled appearance the mesentery and large right upper quadrant colonic air-fluid level all consistent with possible cecal volvulus. I discussed case with Dr. Armandina Gemma of general surgery. He'll see the patient for evaluation.  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

## 2015-11-13 LAB — BASIC METABOLIC PANEL
ANION GAP: 5 (ref 5–15)
BUN: 7 mg/dL (ref 6–20)
CALCIUM: 9.3 mg/dL (ref 8.9–10.3)
CHLORIDE: 103 mmol/L (ref 101–111)
CO2: 28 mmol/L (ref 22–32)
CREATININE: 0.38 mg/dL — AB (ref 0.44–1.00)
GFR calc non Af Amer: >60 mL/min (ref >60)
Glucose, Bld: 145 mg/dL — ABNORMAL HIGH (ref 65–99)
Potassium: 3.8 mmol/L (ref 3.5–5.1)
SODIUM: 136 mmol/L (ref 135–145)

## 2015-11-13 LAB — CBC
HCT: 33.5 % — ABNORMAL LOW (ref 36.0–46.0)
HEMOGLOBIN: 11 g/dL — AB (ref 12.0–15.0)
MCH: 28.9 pg (ref 26.0–34.0)
MCHC: 32.8 g/dL (ref 30.0–36.0)
MCV: 88.2 fL (ref 78.0–100.0)
Platelets: 252 10*3/uL (ref 150–400)
RBC: 3.8 MIL/uL — ABNORMAL LOW (ref 3.87–5.11)
RDW: 14.9 % (ref 11.5–15.5)
WBC: 6.4 10*3/uL (ref 4.0–10.5)

## 2015-11-13 NOTE — Discharge Instructions (Signed)
Volvulus Volvulus is an abnormal twisting of a portion of your digestive tract. Your digestive tract consists of your swallowing tube (esophagus), followed by your stomach, small intestine, and large intestine. This twisting can block the flow of digestion (intestinal obstruction). It can also block the blood flow to the part of the digestive tract that is twisted. Lack of blood flow can cause the twisted part of the digestive tract to die. Volvulus is a medical emergency. There are various types of volvulus:  Sigmoid volvulus is a twisting of the last part of your large intestine. This is the most common type.  Midgut volvulus usually occurs in children who are born with an abnormally positioned small intestine (malrotation).  Cecal volvulus may be caused by scar tissue from previous abdominal surgery.  Gastric volvulus is a rare type of volvulus that occurs when the stomach twists around itself. CAUSES  Volvulus may be caused by many different things. It can be something you are born with (congenital deformity), or it may be a problem that develops from another condition. RISK FACTORS You may have a greater risk of volvulus if you:  Are 25 years of age or older.  Have long-standing (chronic) constipation.  Have part of your stomach located above the area where the stomach and esophagus meet (paraesophageal hernia).  Are bedridden.  Have had previous abdominal surgery.  Live in a long-term care facility. SIGNS AND SYMPTOMS  Signs and symptoms of most types of volvulus include:  Abdominal pain.  Sigmoid volvulus may cause pain in the lower left part of the abdomen.  Cecal volvulus may cause pain in the lower right part of the abdomen.  Gastric and midgut volvulus may cause pain in the upper abdomen.  Bloating and swelling of the abdomen.  Decreased passing of gas or inability to pass gas.  Nausea.  Vomiting.  Constipation.  Tenderness when pressing on the abdomen. As  the condition gets worse, the volvulus can develop a hole (perforation) and leak digestive contents into the abdomen. This can cause late symptoms of volvulus, including:  Severe infection (sepsis).  Bleeding into the abdomen.  Very low blood pressure (shock). DIAGNOSIS  Your health care provider may suspect volvulus if you have sudden signs and symptoms of intestinal obstruction. A physical exam will be done. The health care provider will listen to your abdomen for the sounds of digestion and will feel your abdomen for tenderness. Imaging studies of your abdomen may also be done. These may include:  CT scan. This is the best imaging study for diagnosing volvulus.  Plain X-rays. These may show air and fluid levels and widening above the obstruction.  Ultrasound. TREATMENT  Volvulus is almost always a medical emergency requiring immediate surgery. A surgeon may attempt to do a procedure to untwist the volvulus if possible. If the volvulus cannot be untwisted, the part of the digestive tract involved may need to be removed.   This information is not intended to replace advice given to you by your health care provider. Make sure you discuss any questions you have with your health care provider.   Document Released: 12/09/2000 Document Revised: 04/06/2014 Document Reviewed: 11/29/2013 Elsevier Interactive Patient Education [employer].

## 2015-11-13 NOTE — Discharge Summary (Signed)
Physician Discharge Summary  Patient ID: Anne [name] MRN: [id number] DOB/AGE: 72-Aug-1947 72 y.o.  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  Patient has a hx of left colectomy at [facility] two and a half years ago for carcinoma.  She has known pulmonary metastasis and is currently in an immunotherapy trial at [facility].  She is followed locally by Dr. Julieanne Manson.  Additional abdominal surgery includes diverting ileostomy and takedown, and abdominoplasty.  Patient developed abdominal pain and nausea this AM.  She has had at least one previous episode relieved by laxatives.  Patient had plain AXR suspicious for cecal volvulus and then underwent CT scan which is very suspicious of cecal volvulus.  There is no sign of obstruction or ischemia.  Pain has improved and patient states is "2 out of 10" now.  Daughter at bedside.  CT also shows enlargement of pulmonary mets, new adrenal met, and pelvic tumor recurrence.  This was discussed with patient and daughter at time of admission tonight.  Pt was admitted and kept NPO.  Follow up film the next AM was normal.  She was started back on a diet and this was advanced.  She was up to a soft diet.  She ask to be discharged early to make her chemotherapy at [facility].  She was asymptomatic and discharged home.    CBC Latest Ref Rng & Units 11/13/2015  11/11/2015 11/11/2015  WBC 4.0 - 10.5 K/uL 6.4 9.1 9.5  Hemoglobin 12.0 - 15.0 g/dL 11.0(L) 11.5(L) 12.6  Hematocrit 36.0 - 46.0 % 33.5(L) 35.1(L) 39.0  Platelets 150 - 400 K/uL 252 287 322   CMP Latest Ref Rng & Units 11/13/2015 11/11/2015 11/11/2015  Glucose 65 - 99 mg/dL 145(H) 149(H) 243(H)  BUN 6 - 20 mg/dL 7 19 17.0  Creatinine 0.44 - 1.00 mg/dL 0.38(L) 0.40(L) 0.7  Sodium 135 - 145 mmol/L 136 136 137  Potassium 3.5 - 5.1 mmol/L 3.8 4.3 3.7  Chloride 101 - 111 mmol/L 103 101 -  CO2 22 - 32 mmol/L _0 Calcium 8.9 - 10.3 mg/dL 9.3 9.0 10.3  Total Protein 6.5 - 8.1 g/dL - 6.7 7.3  Total Bilirubin 0.3 - 1.2 mg/dL - 0.6 0.30  Alkaline Phos 38 - 126 U/L - 56 67  AST 15 - 41 U/L - 34 35(H)  ALT 14 - 54 U/L - 26 22    Condition on d/c:  Stable    Disposition: 01-Home or Self Care     Medication List    TAKE these medications   amLODipine 10 MG tablet Commonly known as:  NORVASC Take 10 mg by mouth daily.   carbamide peroxide 6.5 % otic solution Commonly known as:  DEBROX Place 5 drops into both ears 2 (two) times daily as needed (for wax removal).   cobimetinib fumarate 20 MG tablet Commonly known as:  COTELLIC Take 60 mg by mouth daily.  diphenhydrAMINE 25 mg capsule Commonly known as:  BENADRYL Take 50 mg by mouth every 6 (six) hours as needed for itching, allergies or sleep.   estrogen-methylTESTOSTERone 1.25-2.5 MG tablet Commonly known as:  ESTRATEST Take 1 tablet by mouth daily.   hydrochlorothiazide 12.5 MG capsule Commonly known as:  MICROZIDE Take 12.5 mg by mouth daily.   hydrocortisone 2.5 % cream Apply 1 application topically 2 (two) times daily as needed (for itching).   loperamide 2 MG capsule Commonly known as:  IMODIUM Take 2 mg by mouth as needed for diarrhea or loose stools.   loratadine 10 MG tablet Commonly known as:  CLARITIN Take 10 mg by mouth daily.   Melatonin 3 MG Tabs Take 3 mg by mouth at bedtime as needed (for sleep).    metFORMIN 1000 MG tablet Commonly known as:  GLUCOPHAGE Take 1,000 mg by mouth 2 (two) times daily with a meal.   methylphenidate 27 MG CR tablet Commonly known as:  CONCERTA Take 1 tablet (27 mg total) by mouth daily.   MINIVELLE 0.0375 MG/24HR Generic drug:  estradiol Place 1 patch onto the skin 2 (two) times a week.   multivitamin with minerals Tabs tablet Take 1 tablet by mouth daily.   ondansetron 8 MG tablet Commonly known as:  ZOFRAN Take 1 tablet (8 mg total) by mouth every 8 (eight) hours as needed for nausea or vomiting.   OPCON-A 0.027-0.315 % Soln Generic drug:  Naphazoline-Pheniramine Place 1 drop into both eyes daily.   oxyCODONE-acetaminophen 10-325 MG tablet Commonly known as:  PERCOCET Take 1 tablet by mouth every 4 (four) hours as needed for pain.   PRILOSEC 20 MG capsule Generic drug:  omeprazole Take 20 mg by mouth daily.   sitaGLIPtin 100 MG tablet Commonly known as:  JANUVIA Take 100 mg by mouth daily.   triamcinolone cream 0.5 % Commonly known as:  KENALOG Apply 1 application topically 2 (two) times daily.        SignedEarnstine Regal 11/13/2015, 3:47 PM

## 2015-11-13 NOTE — Progress Notes (Signed)
Nursing Discharge Summary  Patient ID: Anne [name] MRN: [id number] DOB/AGE: 1945/07/02 70 y.o.  Admit date: 11/11/2015 Discharge date: 11/13/2015  Discharged Condition: good  Disposition: 01-Home or Self Care    Prescriptions Given: No new prescriptions. Patient follow up appointments and medications dicussed.  Patient verbalized understanding without further questions.   Means of Discharge: Patient transported downstairs to be discharged home via private vehicle  Patient PAC left accessed for her appointment at [facility] with 10cc normal saline for transport.    Signed: Buel Ream 11/13/2015, 11:11 AM

## 2015-11-13 NOTE — Progress Notes (Signed)
Subjective: Eating a soft diet, had a small Bm yesterday.  Feels fine.  Would like to make her Chemo Rx at 10 at [facility]   Objective: Vital signs in last 24 hours: Temp:  [97 F (36.1 C)-99.1 F (37.3 C)] 98.1 F (36.7 C) (08/16 0536) Pulse Rate:  [70-76] 74 (08/16 0536) Resp:  [15-16] 16 (08/16 0536) BP: (136-149)/(64-84) 146/77 (08/16 0536) SpO2:  [97 %-100 %] 97 % (08/16 0536) Last BM Date: 11/09/15 720 PO 2000 URINE. NO BM Afebrile, VSS Afebrile, VSS Intake/Output from previous day: 08/15 0701 - 08/16 0700 In: 1968.8 [P.O.:720; I.V.:1248.8] Out: 2000 [Urine:2000] Intake/Output this shift: No intake/output data recorded.  General appearance: alert, cooperative and no distress GI: soft, non-tender; bowel sounds normal; no masses,  no organomegaly  Lab Results:   Recent Labs  11/11/15 1754 11/13/15 0545  WBC 9.1 6.4  HGB 11.5* 11.0*  HCT 35.1* 33.5*  PLT 287 252    BMET  Recent Labs  11/11/15 1754 11/13/15 0545  NA 136 136  K 4.3 3.8  CL 101 103  CO2 27 28  GLUCOSE 149* 145*  BUN 19 7  CREATININE 0.40* 0.38*  CALCIUM 9.0 9.3   PT/INR No results for input(s): LABPROT, INR in the last 72 hours.   Recent Labs Lab 11/11/15 1043 11/11/15 1754  AST 35* 34  ALT 22 26  ALKPHOS 67 56  BILITOT 0.30 0.6  PROT 7.3 6.7  ALBUMIN 3.4* 3.7     Lipase     Component Value Date/Time   LIPASE 27 11/11/2015 1754     Studies/Results: Ct Abdomen Pelvis W Contrast  Result Date: 11/11/2015 CLINICAL DATA:  Worsening generalized abdominal pain and nausea over past 24 hours. Metastatic rectal carcinoma. EXAM: CT ABDOMEN AND PELVIS WITH CONTRAST TECHNIQUE: Multidetector CT imaging of the abdomen and pelvis was performed using the standard protocol following bolus administration of intravenous contrast. CONTRAST:  130m ISOVUE-300 IOPAMIDOL (ISOVUE-300) INJECTION 61% COMPARISON:  07/25/2013 FINDINGS: Lower chest: Bibasilar pulmonary nodules and masses have  increased in size since previous study, consistent with progressive pulmonary metastases. Largest in the medial right lower lobe measures 3.1 x 4.3 cm compared with 1.2 x 1.3 cm previously. New small right pleural effusion also noted. Hepatobiliary: No liver masses identified. Gallbladder is unremarkable. Pancreas: No mass, inflammatory changes, or other significant abnormality. Spleen: Within normal limits in size and appearance. 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 Left adrenal or and remains normal in appearance. No evidence of renal masses or hydronephrosis. Unopacified urinary bladder is unremarkable in appearance. Stomach/Bowel: Anorectal surgical anastomosis again seen. Increased rectal wall thickening is seen, with increased perirectal lymphadenopathy, with largest area in the right perirectal region measuring 2.4 x 3.2 cm on image 77/2. This is suspicious for locally recurrent carcinoma. The cecum is dilated and contains air-fluid level in the right upper quadrant of the abdomen. There is a swirled appearance of the mesenteric in the right abdomen, and this is suspicious for cecal volvulus. No evidence of bowel wall thickening, extraluminal air, or abnormal fluid collections. No evidence of dilated small bowel loops. Vascular/Lymphatic: Perirectal lymphadenopathy, as described above. No other sites of lymphadenopathy identified. No evidence of abdominal aortic aneurysm. Aortic atherosclerosis noted. Reproductive: Prior hysterectomy noted. Adnexal regions are unremarkable in appearance. Other: None. Musculoskeletal: No suspicious bone lesions identified. Superior endplate compression fracture of the L5 vertebral body is seen which is new since previous study in 2015, but is  likely chronic. Old T12 vertebral body compression fracture deformity is unchanged. IMPRESSION: Dilated fluid-filled cecum and right upper quadrant abdomen, with  swirled appearance of right abdominal mesenteric, suspicious for cecal volvulus. No evidence of bowel wall thickening, extraluminal air, or abscess. Increased rectal wall thickening and perirectal lymphadenopathy, suspicious for local recurrence of carcinoma. New right adrenal mass, highly suspicious for adrenal metastasis. Increased size and number of bibasilar pulmonary nodules and masses, consistent with pulmonary metastases. Electronically Signed   By: Earle Gell M.D.   On: 11/11/2015 20:39   Dg Abd 2 Views  Result Date: 11/11/2015 CLINICAL DATA:  Vomiting and increasing abdominal pain, history of metastatic colon carcinoma EXAM: ABDOMEN - 2 VIEW COMPARISON:  None. FINDINGS: Scattered large and small bowel gas is noted. Some distension of the colon in the midline is noted to 10.8 cm. Air-fluid level is seen suggestive of some obstructive change. Fecal material is noted throughout the colon. No definitive free air is seen. Postsurgical changes are noted in the right lung base. IMPRESSION: Colonic dilatation in the mid and right upper abdomen. This raises suspicion for possible cecal volvulus. CT may be helpful for further evaluation. No free air is noted. Electronically Signed   By: Inez Catalina M.D.   On: 11/11/2015 13:47   Dg Abd Portable 2v  Result Date: 11/12/2015 CLINICAL DATA:  Colorectal cancer. EXAM: PORTABLE ABDOMEN - 2 VIEW COMPARISON:  CT 11/11/2015. FINDINGS: Soft tissue structures are unremarkable. No bowel distention. No free air. Stool noted throughout the colon. Degenerative changes lumbar spine and both hips. Pelvic calcifications noted most consistent phleboliths. Oral contrast noted in the colon from prior CT. IV contrast in the bladder from prior CT. IMPRESSION: No acute abnormality. No evidence of bowel distention. No cecal distention noted on today's exam. Oral contrast noted in the right colon. Follow-up abdominal exam can be obtained to demonstrate oral contrast in the left colon  and rectum to ensure the absence of colonic obstruction . Electronically Signed   By: Marcello Moores  Register   On: 11/12/2015 09:33    Medications: . amLODipine  10 mg Oral BID  . diatrizoate meglumine-sodium  30 mL Oral Once  . insulin aspart  0-9 Units Subcutaneous TID WC  . linagliptin  5 mg Oral Daily   . 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 Learta Codding - He knows of admission FEN:  soft diet ID:  None DVT:  SCD  PLAN:  [place], she can keep her appointment this AM.   LOS: 1 day    Anne [name] 11/13/2015 [id number]

## 2015-11-14 ENCOUNTER — Telehealth: Payer: Self-pay | Admitting: *Deleted

## 2015-11-14 NOTE — Telephone Encounter (Signed)
Message from pt requesting return call on son's cell. Returned call, no answer. No answer on pt's cell. Left message at home # for pt to call office, leave detailed message if I don't answer.

## 2015-11-14 NOTE — Telephone Encounter (Signed)
Patient called stating that she had her port accessed at [facility] emergency room. Patient port was not deaccessed and would like [facility] to do this on Friday 11/15/15. Patient states that she should be back in [place] at 430pm. Appointment scheduled for that time.

## 2015-11-16 ENCOUNTER — Telehealth: Payer: Self-pay | Admitting: *Deleted

## 2015-11-16 NOTE — Telephone Encounter (Signed)
Pt walked into infusion room this morning requesting PAC deaccessed.  She did not have appt..  States was d/c'd from [place] yesterday w/ PAC still accessed.   Flushed and deaccessed PAC for pt... Sent Scheduling message to add Flush appt on next MD appt in Sept..  Pt was appreciative.

## 2015-11-28 ENCOUNTER — Other Ambulatory Visit: Payer: Self-pay | Admitting: Oncology

## 2015-12-09 ENCOUNTER — Other Ambulatory Visit: Payer: Self-pay | Admitting: Internal Medicine

## 2015-12-11 NOTE — Telephone Encounter (Signed)
Do not see that pt had an A1C in care everywhere.

## 2015-12-12 NOTE — Telephone Encounter (Signed)
Tell patient we need to get  A hg a1c  Done   Advise yearly OV and we can do at the visit  But if that is too difficult   For her then Please arrange for HG a1c to be done at one of her blood draws elsewhere .  Dx DM   Ok to refill med x 2 months to get this done.

## 2015-12-13 ENCOUNTER — Telehealth: Payer: Self-pay | Admitting: Family Medicine

## 2015-12-13 ENCOUNTER — Other Ambulatory Visit: Payer: Self-pay | Admitting: *Deleted

## 2015-12-13 MED ORDER — SITAGLIPTIN PHOSPHATE 100 MG PO TABS: 100.0000 mg | ORAL_TABLET | Freq: Every day | ORAL | 1 refills | Status: DC

## 2015-12-13 NOTE — Telephone Encounter (Signed)
Message received from pt.'s sister requesting refill on Januvia.  She stated that they have attempted to get refill through PCP for over a week with no assistance.  Per Dr. Benay Spice, [place] to refill pt.'s Januvia as prescribed.

## 2015-12-13 NOTE — Telephone Encounter (Signed)
Filled for 2 months.  Message sent to scheduling.

## 2015-12-13 NOTE — Telephone Encounter (Signed)
Pt due for yearly OV and A1C.  Please help to schedule this appointment.  Thanks!!  Please let me know if pt is unable to schedule appt.

## 2015-12-17 ENCOUNTER — Telehealth: Payer: Self-pay | Admitting: *Deleted

## 2015-12-17 NOTE — Telephone Encounter (Signed)
Message received from patient's sister requesting additional appt for patient to see Dr. Benay Spice on 12/26/15 when pt.'s son will be in town.  Dr. Benay Spice notified and OK for appt on 12/26/15 at 0830 per Dr. Benay Spice.  Message sent to schedulers.

## 2015-12-17 NOTE — Telephone Encounter (Signed)
Pt decline to make an appt

## 2015-12-17 NOTE — Telephone Encounter (Signed)
Left a message for a return call.

## 2015-12-17 NOTE — Telephone Encounter (Signed)
Please  Ask her to get hg a1c at her next labs with her specialists sent to [place]   I understand if she doesn't want to make appt with [place] at this time She is under  care  with her specialts

## 2015-12-18 ENCOUNTER — Ambulatory Visit (HOSPITAL_BASED_OUTPATIENT_CLINIC_OR_DEPARTMENT_OTHER): Payer: Medicare Other | Admitting: Oncology

## 2015-12-18 VITALS — BP 127/63 | HR 92 | Temp 98.0°F | Resp 16 | Ht 64.0 in | Wt 126.3 lb

## 2015-12-18 DIAGNOSIS — L270 Generalized skin eruption due to drugs and medicaments taken internally: Secondary | ICD-10-CM

## 2015-12-18 DIAGNOSIS — C7800 Secondary malignant neoplasm of unspecified lung: Secondary | ICD-10-CM

## 2015-12-18 DIAGNOSIS — I10 Essential (primary) hypertension: Secondary | ICD-10-CM

## 2015-12-18 DIAGNOSIS — C20 Malignant neoplasm of rectum: Secondary | ICD-10-CM

## 2015-12-18 NOTE — Progress Notes (Signed)
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 She continues treatment on protocol at [facility]. She continues to have a diffuse pruritic rash. She was last treated with Nivolumab On 12/11/2015. She reports intermittent diarrhea. No consistent pain. No recurrence of the obstructive symptoms. Restaging CTs on 12/11/2015 revealed stable disease in the lungs and 2 new suspicious liver lesions.  Objective:  Vital signs in last 24 hours:  Blood pressure 127/63, pulse 92, temperature 98 F (36.7 C), temperature source Oral, resp. rate 16, height 5\' 4"  (1.626 m), weight 126 lb 4.8 oz (57.3 kg), SpO2 97 %.    HEENT: No thrush or ulcers  Resp: Decreased breath sounds throughout the right chest, no respiratory distress  Cardio: Regular rate and rhythm  GI: No hepatosplenomegaly, nontender  Vascular: Trace edema at the left lower leg   Skin:Maculopapular erythematous rash over the trunk and extremities   Portacath/PICC-without erythema  Lab Results:  Lab Results  Component Value Date   WBC 6.4 11/13/2015   HGB 11.0 (L) 11/13/2015   HCT 33.5 (L) 11/13/2015   MCV 88.2 11/13/2015   PLT 252 11/13/2015   NEUTROABS 7.4 11/11/2015     Medications: I have reviewed the patient's current medications.  Assessment/Plan: 1. Metastatic rectal cancer diagnosed July 2014. MSI stable on the March 2015 resection specimen. Foundation 1 Testing confirmed a KRAS G13 mutation, APC mutation, and BRIP1 mutation.  Colonoscopy on 10/25/2012 showed a bulky circumferential friable mass lesion in the rectum at approximately 4 cm above the dentate line. The mass was subtotally obstructing and would not permit passage of the colonoscope proximal. Biopsy showed adenocarcinoma.   Staging CT scans 10/25/2012 showed a right lower lobe lung mass measuring 5 x 4.6 cm; 0.8 cm nodule right upper lobe; masslike rectosigmoid wall  thickening, surrounding stranding and adjacent lymphadenopathy.   PET scan 11/02/2012 showed hypermetabolism in the area of apparent circumferential rectal wall thickening; the 5 cm right lower lobe pulmonary mass was hypermetabolic with SUV max equal 8. No other hypermetabolic lung lesions were evident. Small bilateral pulmonary parenchymal nodules were noted.   She completed 5 cycles of FOLFOX chemotherapy 11/22/2012 through 02/13/2013.   Status post right thoracotomy, bilobectomy (right lower and middle lobectomy) with en bloc resection of the diaphragm 02/27/2013. Pathology showed adenocarcinoma consistent with metastatic colorectal adenocarcinoma, 4.3 cm, negative margins, no lymph node involvement. Negative soft tissue biopsy of the diaphragm x2.Positive for K-ras mutation-codon 13   She completed concurrent radiation and Xeloda for treatment of the rectal tumor 03/06/2013 through 04/11/2013.   Status post low anterior resection with a diverting loop ileostomy on 05/31/2013 at [facility] in [place]. Final pathology showed moderately differentiated adenocarcinoma located in the upper rectum; the tumor measured 2.5 x 2.2 cm; there was invasion through the muscularis propria into pericolic adipose tissue; tumor budding was present. Lymphatic status negative; vascular status negative; proximal and distal surgical margins negative; 2 of 17 lymph nodes showed metastatic adenocarcinoma. Mesenteric tumor deposits were present. Perineural invasion present. [facility] confirmed an additional positive lymph node and the tumor was staged as a T4a   CT abdomen/pelvis 05/12/2013. Metastatic nodules in the lung increased. Chronic loculated right base pneumothorax. Diffuse mucosal thickening of the sigmoid portion of the colon.   Restaging CTs 07/25/2013 with enlargement of bilateral lung nodules, no other evidence of metastatic disease   Ileostomy takedown 08/14/2013   Chest CT  10/02/2013  revealed enlargement of bilateral lung nodules and increased right pleural fluid   Cycle 1 of FOLFIRI/Avastin 10/03/2013   Cycle 6 of FOLFIRI/Avastin 12/12/2013   Restaging CT 12/22/2013 with a decrease in the size of bilateral lung nodules, no evidence of disease progression   Cycle 7 FOLFIRI/Avastin 12/25/2013  Cycle 8 FOLFIRI/Avastin 01/10/2014  Cycle 9 FOLFIRI/Avastin 01/24/2014  Cycle 10 FOLFIRI/Avastin 02/07/2014  Cycle of 11 FOLFIRI/Avastin 02/21/2014  Restaging CT 03/05/2014 with a decrease in the size of 2 dominant right lung nodules, no new nodules, new pleural gas collection at the right apex  Cycle 12 FOLFIRI/Avastin 03/07/2014  Cycle 13 FOLFIRI/Avastin 03/20/2014  Cycle 14 FOLFIRI/Avastin 04/04/2014  Initiation of maintenance Xeloda/Avastin 04/18/2014  CT chest 07/09/2014 revealed slight enlargement of lung nodules  Treatment with single agent Avastin 07/10/2014  Resumption of FOLFIRI/Avastin on a 2 week schedule 07/25/2014  CT angio chest 09/14/2014 with essentially stable multifocal pulmonary metastatic disease.  5-fluorouracil dose reduced beginning 09/19/2014  FOLFIRI/Avastin switch to a 3 week schedule beginning with treatment 10/10/2014 secondary to malaise, diarrhea, and mucositis, irinotecan dose reduced  CT 12/26/2014 confirmed an increase in the size of bilateral lung nodules  FOLFOX/Avastin initiated on a 2 week schedule 01/02/2015  CT chest 03/20/2015 with slight enlargement of multiple lung nodules  Enrollment on an immunotherapy trial at [facility] March 2017-treatment began with Nivolumab, Ipilumumab, and cobimetinib 06/11/2015  Treatment placed on hold due to a skin rash 09/30/2015  Restaging CT scans 10/28/2015 with interval increase in the size of multiple bilateral pulmonary nodules and masses, increased size of right adrenal mass  Treatment resumed 10/29/2015  [facility] at [facility] 12/11/2015-stable lung nodules/masses, 2  new liver lesions suspicious for metastases, stable adrenal mass  2. Status post right VATS, drainage of pleural effusion, visceral and parietal pleural decortication 12/21/2012. Pleural fluid culture positive for viridans Streptococcus. Right pleura biopsies negative for malignancy. Findings favored empyema. 3. Diabetes. 4. Frequent bowel movements, diminished pelvic muscle tone-we made a referral to the pelvic physical therapy clinic 5. Pain in the left leg with tenderness at the left calf and left ischium-MRI of the lumbar spine and pelvis on 10/13/2011 with no evidence of metastatic disease. A right iliac bone "lesion" was felt to most likely represent heterogenous marrow. Edema and enhancement was noted at the left sciatic nerve compared to the right side with no mass lesion 6. Hypertension-on HCTZ 7. Left foot fracture 8. BRIP1 mutation 9. Mucositis most likely related to 5-fluorouracil. Dose reduction of 5 fluorouracil beginning 09/19/2014. 10. Erythematous rash-nivolumab,ipilumumab, or cobimetinib 11. 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 She has a persistent pruritic skin rash secondary to the current systemic therapy. The restaging CT at [facility] last week revealed 2 new liver lesions suspicious for disease progression. She is scheduled to see Dr. Dennison Nancy on 12/25/2015.  I discussed the CT findings and treatment options with Dr. [name] and her daughter. A decision will be made next week on continuing protocol therapy. If she comes off protocol we will consider  Lonsurf or repeat treatment with FOLFOX while investigating additional clinical trial eligibility.  She is scheduled for a return visit here on 12/26/2015.  Betsy Coder, MD  12/18/2015  9:31 AM

## 2015-12-26 ENCOUNTER — Telehealth: Payer: Self-pay | Admitting: Oncology

## 2015-12-26 ENCOUNTER — Ambulatory Visit (HOSPITAL_BASED_OUTPATIENT_CLINIC_OR_DEPARTMENT_OTHER): Payer: Medicare Other

## 2015-12-26 ENCOUNTER — Ambulatory Visit (HOSPITAL_BASED_OUTPATIENT_CLINIC_OR_DEPARTMENT_OTHER): Payer: Medicare Other | Admitting: Oncology

## 2015-12-26 VITALS — BP 124/63 | HR 73 | Temp 98.5°F | Resp 18 | Ht 64.0 in | Wt 123.2 lb

## 2015-12-26 DIAGNOSIS — C7650 Malignant neoplasm of unspecified lower limb: Secondary | ICD-10-CM

## 2015-12-26 DIAGNOSIS — Z23 Encounter for immunization: Secondary | ICD-10-CM

## 2015-12-26 DIAGNOSIS — E119 Type 2 diabetes mellitus without complications: Secondary | ICD-10-CM

## 2015-12-26 DIAGNOSIS — C7800 Secondary malignant neoplasm of unspecified lung: Secondary | ICD-10-CM

## 2015-12-26 DIAGNOSIS — E279 Disorder of adrenal gland, unspecified: Secondary | ICD-10-CM

## 2015-12-26 DIAGNOSIS — I10 Essential (primary) hypertension: Secondary | ICD-10-CM

## 2015-12-26 DIAGNOSIS — C20 Malignant neoplasm of rectum: Secondary | ICD-10-CM

## 2015-12-26 DIAGNOSIS — K769 Liver disease, unspecified: Secondary | ICD-10-CM | POA: Diagnosis not present

## 2015-12-26 DIAGNOSIS — Z95828 Presence of other vascular implants and grafts: Secondary | ICD-10-CM

## 2015-12-26 DIAGNOSIS — L270 Generalized skin eruption due to drugs and medicaments taken internally: Secondary | ICD-10-CM | POA: Diagnosis not present

## 2015-12-26 LAB — CBC WITH DIFFERENTIAL/PLATELET
BASO%: 0.8 % (ref 0.0–2.0)
BASOS ABS: 0 10*3/uL (ref 0.0–0.1)
EOS ABS: 0.3 10*3/uL (ref 0.0–0.5)
EOS%: 5.7 % (ref 0.0–7.0)
HCT: 33.3 % — ABNORMAL LOW (ref 34.8–46.6)
HEMOGLOBIN: 10.8 g/dL — AB (ref 11.6–15.9)
LYMPH%: 12.6 % — AB (ref 14.0–49.7)
MCH: 27.4 pg (ref 25.1–34.0)
MCHC: 32.4 g/dL (ref 31.5–36.0)
MCV: 84.6 fL (ref 79.5–101.0)
MONO#: 0.4 10*3/uL (ref 0.1–0.9)
MONO%: 7.3 % (ref 0.0–14.0)
NEUT#: 4.5 10*3/uL (ref 1.5–6.5)
NEUT%: 73.6 % (ref 38.4–76.8)
Platelets: 325 10*3/uL (ref 145–400)
RBC: 3.94 10*6/uL (ref 3.70–5.45)
RDW: 16.3 % — ABNORMAL HIGH (ref 11.2–14.5)
WBC: 6.1 10*3/uL (ref 3.9–10.3)
lymph#: 0.8 10*3/uL — ABNORMAL LOW (ref 0.9–3.3)

## 2015-12-26 LAB — COMPREHENSIVE METABOLIC PANEL
ALBUMIN: 3.5 g/dL (ref 3.5–5.0)
ALK PHOS: 88 U/L (ref 40–150)
ALT: 15 U/L (ref 0–55)
AST: 18 U/L (ref 5–34)
Anion Gap: 11 mEq/L (ref 3–11)
BUN: 12.9 mg/dL (ref 7.0–26.0)
CHLORIDE: 102 meq/L (ref 98–109)
CO2: 27 mEq/L (ref 22–29)
Calcium: 10.2 mg/dL (ref 8.4–10.4)
Creatinine: 0.7 mg/dL (ref 0.6–1.1)
EGFR: 87 mL/min/{1.73_m2} — AB (ref >90)
GLUCOSE: 119 mg/dL (ref 70–140)
POTASSIUM: 3.8 meq/L (ref 3.5–5.1)
SODIUM: 139 meq/L (ref 136–145)
Total Bilirubin: <0.3 mg/dL (ref 0.20–1.20)
Total Protein: 7.3 g/dL (ref 6.4–8.3)

## 2015-12-26 LAB — CEA (IN HOUSE-CHCC): CEA (CHCC-In House): 72.98 ng/mL — ABNORMAL HIGH (ref 0.00–5.00)

## 2015-12-26 MED ORDER — HEPARIN SOD (PORK) LOCK FLUSH 100 UNIT/ML IV SOLN
500.0000 [IU] | Freq: Once | INTRAVENOUS | Status: AC | PRN
  Administered 2015-12-26: 500 [IU] via INTRAVENOUS
  Filled 2015-12-26: qty 5

## 2015-12-26 MED ORDER — SODIUM CHLORIDE 0.9 % IJ SOLN
10.0000 mL | INTRAMUSCULAR | Status: DC | PRN
  Administered 2015-12-26: 10 mL via INTRAVENOUS
  Filled 2015-12-26: qty 10

## 2015-12-26 MED ORDER — OXYCODONE-ACETAMINOPHEN 10-325 MG PO TABS: 1.0000 | ORAL_TABLET | ORAL | 0 refills | Status: DC | PRN

## 2015-12-26 MED ORDER — INFLUENZA VAC SPLIT QUAD 0.5 ML IM SUSY
0.5000 mL | PREFILLED_SYRINGE | Freq: Once | INTRAMUSCULAR | Status: AC
  Administered 2015-12-26: 0.5 mL via INTRAMUSCULAR
  Filled 2015-12-26: qty 0.5

## 2015-12-26 NOTE — Progress Notes (Signed)
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 No new complaint. The skin rash is fading. She reports she has been taken off of the immunotherapy trial at [facility] secondary to disease progression. She returns with her family to discuss treatment options.  Objective:  Vital signs in last 24 hours:  Blood pressure 124/63, pulse 73, temperature 98.5 F (36.9 C), temperature source Oral, resp. rate 18, height '5\' 4"'$  (1.626 m), weight 123 lb 3.2 oz (55.9 kg), SpO2 98 %.  Resp: Decreased breath sounds at the right compared to the left chest, no respiratory distress Cardio: Regular rate and rhythm GI: No hepatosplenomegaly, nontender, no mass Vascular: No leg edema  Skin: Erythematous rash over the trunk and extremities   Portacath/PICC-without erythema  Lab Results:  Lab Results  Component Value Date   WBC 6.4 11/13/2015   HGB 11.0 (L) 11/13/2015   HCT 33.5 (L) 11/13/2015   MCV 88.2 11/13/2015   PLT 252 11/13/2015   NEUTROABS 7.4 11/11/2015     Medications: I have reviewed the patient's current medications.  Assessment/Plan: 1. Metastatic rectal cancer diagnosed July 2014. MSI stable on the March 2015 resection specimen. Foundation 1 Testing confirmed a KRAS G13 mutation, APC mutation, and BRIP1 mutation.  Colonoscopy on 10/25/2012 showed a bulky circumferential friable mass lesion in the rectum at approximately 4 cm above the dentate line. The mass was subtotally obstructing and would not permit passage of the colonoscope proximal. Biopsy showed adenocarcinoma.   Staging CT scans 10/25/2012 showed a right lower lobe lung mass measuring 5 x 4.6 cm; 0.8 cm nodule right upper lobe; masslike rectosigmoid wall thickening, surrounding stranding and adjacent lymphadenopathy.   PET scan 11/02/2012 showed hypermetabolism in the area of apparent circumferential rectal wall thickening; the 5 cm right  lower lobe pulmonary mass was hypermetabolic with SUV max equal 8. No other hypermetabolic lung lesions were evident. Small bilateral pulmonary parenchymal nodules were noted.   She completed 5 cycles of FOLFOX chemotherapy 11/22/2012 through 02/13/2013.   Status post right thoracotomy, bilobectomy (right lower and middle lobectomy) with en bloc resection of the diaphragm 02/27/2013. Pathology showed adenocarcinoma consistent with metastatic colorectal adenocarcinoma, 4.3 cm, negative margins, no lymph node involvement. Negative soft tissue biopsy of the diaphragm x2.Positive for K-ras mutation-codon 13   She completed concurrent radiation and Xeloda for treatment of the rectal tumor 03/06/2013 through 04/11/2013.   Status post low anterior resection with a diverting loop ileostomy on 05/31/2013 at [facility] in [place]. Final pathology showed moderately differentiated adenocarcinoma located in the upper rectum; the tumor measured 2.5 x 2.2 cm; there was invasion through the muscularis propria into pericolic adipose tissue; tumor budding was present. Lymphatic status negative; vascular status negative; proximal and distal surgical margins negative; 2 of 17 lymph nodes showed metastatic adenocarcinoma. Mesenteric tumor deposits were present. Perineural invasion present. [facility] additional [facility] and the tumor was staged as a T4a   CT abdomen/pelvis 05/12/2013. Metastatic nodules in the lung increased. Chronic loculated right base pneumothorax. Diffuse mucosal thickening of the sigmoid portion of the colon.   Restaging CTs 07/25/2013 with enlargement of bilateral lung nodules, no other evidence of metastatic disease   Ileostomy takedown 08/14/2013   Chest CT 10/02/2013 revealed enlargement of bilateral lung nodules and increased right pleural fluid   Cycle 1 of FOLFIRI/Avastin 10/03/2013   Cycle 6 of FOLFIRI/Avastin 12/12/2013   Restaging  CT 12/22/2013 with a decrease in the size of bilateral lung nodules, no evidence of disease progression   Cycle 7 FOLFIRI/Avastin 12/25/2013  Cycle 8 FOLFIRI/Avastin 01/10/2014  Cycle 9 FOLFIRI/Avastin 01/24/2014  Cycle 10 FOLFIRI/Avastin 02/07/2014  Cycle of 11 FOLFIRI/Avastin 02/21/2014  Restaging CT 03/05/2014 with a decrease in the size of 2 dominant right lung nodules, no new nodules, new pleural gas collection at the right apex  Cycle 12 FOLFIRI/Avastin 03/07/2014  Cycle 13 FOLFIRI/Avastin 03/20/2014  Cycle 14 FOLFIRI/Avastin 04/04/2014  Initiation of maintenance Xeloda/Avastin 04/18/2014  CT chest 07/09/2014 revealed slight enlargement of lung nodules  Treatment with single agent Avastin 07/10/2014  Resumption of FOLFIRI/Avastin on a 2 week schedule 07/25/2014  CT angio chest 09/14/2014 with essentially stable multifocal pulmonary metastatic disease.  5-fluorouracil dose reduced beginning 09/19/2014  FOLFIRI/Avastin switch to a 3 week schedule beginning with treatment 10/10/2014 secondary to malaise, diarrhea, and mucositis, irinotecan dose reduced  CT 12/26/2014 confirmed an increase in the size of bilateral lung nodules  FOLFOX/Avastin initiated on a 2 week schedule 01/02/2015  CT chest 03/20/2015 with slight enlargement of multiple lung nodules  Enrollment on an immunotherapy trial at [facility] March 2017-treatment began with Nivolumab, Ipilumumab, and cobimetinib 06/11/2015  Treatment placed on hold due to a skin rash 09/30/2015  Restaging CT scans 10/28/2015 with interval increase in the size of multiple bilateral pulmonary nodules and masses, increased size of right adrenal mass  Treatment resumed 10/29/2015  [facility] CT[facility] at [facility] 12/11/2015-stable lung nodules/masses, 2 new liver lesions suspicious for metastases, stable adrenal mass   Taken off of the [facility] study secondary to disease progression 2. Status post right VATS, drainage of pleural effusion,  visceral and parietal pleural decortication 12/21/2012. Pleural fluid culture positive for viridans Streptococcus. Right pleura biopsies negative for malignancy. Findings favored empyema. 3. Diabetes. 4. Frequent bowel movements, diminished pelvic muscle tone-we made a referral to the pelvic physical therapy clinic 5. Pain in the left leg with tenderness at the left calf and left ischium-MRI of the lumbar spine and pelvis on 10/13/2011 with no evidence of metastatic disease. A right iliac bone "lesion" was felt to most likely represent heterogenous marrow. Edema and enhancement was noted at the left sciatic nerve compared to the right side with no mass lesion 6. Hypertension-on HCTZ 7. Left foot fracture 8. BRIP1 mutation 9. Mucositis most likely related to 5-fluorouracil. Dose reduction of 5 fluorouracil beginning 09/19/2014. 10. Erythematous rash-nivolumab,ipilumumab, or cobimetinib 11. 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 Admission 11/11/2015 with a cecal volvulus-spontaneously resolved     Disposition:  Dr. [name] appears unchanged. She has discontinued treatment on the immunotherapy trial at [facility]. We discussed treatment options.  We initially decided to begin FOLFOX, but realize she had received 2 separate courses of oxaliplatin-based therapy . I recommend Lonsurf. We will prescribe the Lonsurf and begin the insurance approval process today. The hope is to begin Lonsurf next week.  We will continue investigating clinical trial options. Dr. [name] maintains an excellent performance status.   She received an influenza vaccine today.  Betsy Coder, MD  12/26/2015  8:31 AM

## 2015-12-26 NOTE — Telephone Encounter (Signed)
GAVE PATIENT AVS REPORT AND APPOINTMENTS FOR October  °

## 2015-12-26 NOTE — Addendum Note (Signed)
Addended by: Betsy Coder B on: 12/26/2015 09:49 AM   Modules accepted: Orders

## 2015-12-27 ENCOUNTER — Other Ambulatory Visit: Payer: Self-pay | Admitting: *Deleted

## 2015-12-27 LAB — CEA: CEA1: 158.2 ng/mL — AB (ref 0.0–4.7)

## 2015-12-27 MED ORDER — TRIFLURIDINE-TIPIRACIL 20-8.19 MG PO TABS: ORAL_TABLET | ORAL | 0 refills | Status: DC

## 2015-12-31 ENCOUNTER — Telehealth: Payer: Self-pay | Admitting: *Deleted

## 2015-12-31 NOTE — Telephone Encounter (Signed)
Call received from patient's sister Romie Levee inquiring about start of Lonsurf and question about f/u appointment with Dr. Benay Spice.  Per Evelena Peat in pharmacy, [place] prescription will be sent out today.  Call placed back to patient to inform her that [facility] prescription has been sent out and that patient's next appt is on Wednesday, October 18th at [place] with L. Marcello Moores NP.  Pt appreciative of call back and has no questions or concerns at this time.

## 2016-01-01 ENCOUNTER — Encounter: Payer: Self-pay | Admitting: Pharmacist

## 2016-01-01 NOTE — Progress Notes (Signed)
Oral Chemotherapy Pharmacist Encounter Lonsurf -- UPDATE 01/03/2016 11:08 AM  Lonsurf prescription requires prior authorization. The '20mg'$  trifluridine tablets are also only dispensed in quantities of 20, 40, and 60. Current prescription is written for 50 tablets (3 in am and 2 in pm) and can not be processed.   Case discussed with Dr. Benay Spice.   A new prescription will be written for the '15mg'$  trifluridine tablets for patient to take 4 tablets in the am and 4 tablets in the pm for '60mg'$  total twice daily.   This will allow for dose reductions if necessary. New prescription will be sent to [facility] for benefits analysis.  Oral Chemo Clinic will continue to follow.  Johny Drilling, PharmD, BCPS Pharmacy: [phone] Oral Chemo Clinic: [phone] 01/03/2016 11:12 AM     Oral Chemotherapy Pharmacist Encounter  Received prescription for Lonsurf. Labs from 9/28 reviewed and ok fir treatment. Current medication list in Epic reviewed, no significant DDI's with Lonsurf identified.  Prescription will be sent to [facility] for benefits analysis.  Oral Chemo Clinic will continue to follow.   Johny Drilling, PharmD, BCPS 01/01/2016  9:10 AM Oral Chemotherapy Clinic [phone]

## 2016-01-03 ENCOUNTER — Other Ambulatory Visit: Payer: Self-pay | Admitting: *Deleted

## 2016-01-03 MED ORDER — TRIFLURIDINE-TIPIRACIL 15-6.14 MG PO TABS: 35.0000 mg/m2 | ORAL_TABLET | Freq: Two times a day (BID) | ORAL | 0 refills | Status: DC

## 2016-01-03 NOTE — Telephone Encounter (Signed)
No need to do test  Only if  She is having blood work done and not inconvenient.

## 2016-01-03 NOTE — Telephone Encounter (Signed)
Spoke to [employer] (sister) and she informed me that the pt is no longer being seen at [facility].  Treatment did not work. Still seeing Dr. Benay Spice.  Left expectancy is not long.  Cancer has spread.  Will see if we can send a message to Dr. Benay Spice to do lab work.

## 2016-01-06 ENCOUNTER — Telehealth: Payer: Self-pay | Admitting: *Deleted

## 2016-01-06 NOTE — Telephone Encounter (Signed)
"  I called on Friday.  I'm calling for my sister who's to start [place].  The order has not been sent to [place].  Could we expedite this."

## 2016-01-06 NOTE — Telephone Encounter (Signed)
Message sent to Dr. Benay Spice to see if he can add on lab work for A1C.

## 2016-01-07 MED FILL — *LONSURF 15 MG-6.14 MG TAB: 15-6.14 | 28 days supply | Qty: 80 | Fill #0

## 2016-01-08 ENCOUNTER — Other Ambulatory Visit: Payer: Self-pay | Admitting: *Deleted

## 2016-01-08 ENCOUNTER — Telehealth: Payer: Self-pay | Admitting: Pharmacist

## 2016-01-08 DIAGNOSIS — E119 Type 2 diabetes mellitus without complications: Secondary | ICD-10-CM

## 2016-01-08 NOTE — Telephone Encounter (Signed)
Received communication from [place] that  [facility] was ready for pick-up. Patient stated that she would pick it up today.  Copay is $2069. Patient is aware. Will look into assistance for patient before her next refill.  Dr. Gearldine Shown nurse notified for scheduling purposes.  Raul Del, PharmD, [place], BCOP Oral oncology clinc [phone]

## 2016-01-10 ENCOUNTER — Telehealth: Payer: Self-pay | Admitting: *Deleted

## 2016-01-10 NOTE — Telephone Encounter (Signed)
Called pt, she plans to begin [place] on 10/16. Reviewed instructions to take twice daily Mon-Fri for two weeks. She voiced understanding. Office visit will be moved out to the week of 10/30.

## 2016-01-14 ENCOUNTER — Telehealth: Payer: Self-pay | Admitting: *Deleted

## 2016-01-14 NOTE — Telephone Encounter (Signed)
Call received from patient's sister asking if patient's appt could be moved up from scheduled date of 01/30/16.  Call placed back to patient's sister and patient answered the phone stating that she did not want to change appointment from 01/30/16.

## 2016-01-15 ENCOUNTER — Ambulatory Visit: Payer: Medicare Other | Admitting: Nurse Practitioner

## 2016-01-29 ENCOUNTER — Other Ambulatory Visit: Payer: Self-pay | Admitting: *Deleted

## 2016-01-29 DIAGNOSIS — C20 Malignant neoplasm of rectum: Secondary | ICD-10-CM

## 2016-01-29 DIAGNOSIS — C7800 Secondary malignant neoplasm of unspecified lung: Principal | ICD-10-CM

## 2016-01-30 ENCOUNTER — Ambulatory Visit (HOSPITAL_BASED_OUTPATIENT_CLINIC_OR_DEPARTMENT_OTHER): Payer: Medicare Other | Admitting: Nurse Practitioner

## 2016-01-30 ENCOUNTER — Other Ambulatory Visit: Payer: Self-pay

## 2016-01-30 ENCOUNTER — Other Ambulatory Visit (HOSPITAL_BASED_OUTPATIENT_CLINIC_OR_DEPARTMENT_OTHER): Payer: Medicare Other

## 2016-01-30 DIAGNOSIS — K769 Liver disease, unspecified: Secondary | ICD-10-CM

## 2016-01-30 DIAGNOSIS — C7802 Secondary malignant neoplasm of left lung: Secondary | ICD-10-CM | POA: Diagnosis not present

## 2016-01-30 DIAGNOSIS — C7650 Malignant neoplasm of unspecified lower limb: Secondary | ICD-10-CM | POA: Diagnosis not present

## 2016-01-30 DIAGNOSIS — C20 Malignant neoplasm of rectum: Secondary | ICD-10-CM | POA: Diagnosis present

## 2016-01-30 DIAGNOSIS — Z23 Encounter for immunization: Secondary | ICD-10-CM

## 2016-01-30 DIAGNOSIS — E119 Type 2 diabetes mellitus without complications: Secondary | ICD-10-CM

## 2016-01-30 DIAGNOSIS — C7800 Secondary malignant neoplasm of unspecified lung: Secondary | ICD-10-CM

## 2016-01-30 DIAGNOSIS — C7801 Secondary malignant neoplasm of right lung: Secondary | ICD-10-CM

## 2016-01-30 DIAGNOSIS — E279 Disorder of adrenal gland, unspecified: Secondary | ICD-10-CM

## 2016-01-30 LAB — CBC WITH DIFFERENTIAL/PLATELET
BASO%: 0.6 % (ref 0.0–2.0)
Basophils Absolute: 0 10*3/uL (ref 0.0–0.1)
EOS%: 1.8 % (ref 0.0–7.0)
Eosinophils Absolute: 0 10*3/uL (ref 0.0–0.5)
HEMATOCRIT: 32.7 % — AB (ref 34.8–46.6)
HEMOGLOBIN: 10.6 g/dL — AB (ref 11.6–15.9)
LYMPH#: 0.6 10*3/uL — AB (ref 0.9–3.3)
LYMPH%: 24.6 % (ref 14.0–49.7)
MCH: 26.7 pg (ref 25.1–34.0)
MCHC: 32.3 g/dL (ref 31.5–36.0)
MCV: 82.7 fL (ref 79.5–101.0)
MONO#: 0.1 10*3/uL (ref 0.1–0.9)
MONO%: 5.3 % (ref 0.0–14.0)
NEUT%: 67.7 % (ref 38.4–76.8)
NEUTROS ABS: 1.8 10*3/uL (ref 1.5–6.5)
Platelets: 254 10*3/uL (ref 145–400)
RBC: 3.95 10*6/uL (ref 3.70–5.45)
RDW: 17.4 % — AB (ref 11.2–14.5)
WBC: 2.6 10*3/uL — AB (ref 3.9–10.3)

## 2016-01-30 LAB — COMPREHENSIVE METABOLIC PANEL
ALT: 117 U/L — AB (ref 0–55)
AST: 48 U/L — AB (ref 5–34)
Albumin: 3.8 g/dL (ref 3.5–5.0)
Alkaline Phosphatase: 123 U/L (ref 40–150)
Anion Gap: 9 mEq/L (ref 3–11)
BILIRUBIN TOTAL: 0.36 mg/dL (ref 0.20–1.20)
BUN: 13.3 mg/dL (ref 7.0–26.0)
CALCIUM: 10.4 mg/dL (ref 8.4–10.4)
CHLORIDE: 101 meq/L (ref 98–109)
CO2: 28 meq/L (ref 22–29)
CREATININE: 0.7 mg/dL (ref 0.6–1.1)
EGFR: 87 mL/min/{1.73_m2} — ABNORMAL LOW (ref >90)
Glucose: 119 mg/dl (ref 70–140)
Potassium: 3.9 mEq/L (ref 3.5–5.1)
Sodium: 138 mEq/L (ref 136–145)
TOTAL PROTEIN: 7.6 g/dL (ref 6.4–8.3)

## 2016-01-30 MED ORDER — TRIFLURIDINE-TIPIRACIL 15-6.14 MG PO TABS: 35.0000 mg/m2 | ORAL_TABLET | Freq: Two times a day (BID) | ORAL | 0 refills | Status: DC

## 2016-01-30 MED ORDER — OXYCODONE-ACETAMINOPHEN 10-325 MG PO TABS: 1.0000 | ORAL_TABLET | ORAL | 0 refills | Status: DC | PRN

## 2016-01-30 MED FILL — LONSURF 15 MG-6.14 MG TAB: 15-6.14 | 28 days supply | Qty: 80 | Fill #0

## 2016-01-30 NOTE — Progress Notes (Addendum)
[facility] OFFICE PROGRESS NOTE   Diagnosis:  Rectal cancer  INTERVAL HISTORY:   Dr. Ferrell returns as scheduled. She began cycle 1 Lonsurf 01/13/2016. She denies nausea/vomiting. No mouth sores. No change in baseline bowel habits which consist of alternating diarrhea and constipation. Persistent rash on the back, predated current treatment. The rash is pruritic.  Objective:  Vital signs in last 24 hours:  Blood pressure 131/68, pulse 87, temperature 98.6 F (37 C), resp. rate 18, height 5' 4" (1.626 m), weight 120 lb 4.8 oz (54.6 kg), SpO2 97 %.    HEENT: No thrush or ulcers. Resp: Lungs clear bilaterally. Breath sounds are diminished at the right lung base. No respiratory distress. Cardio: Regular rate and rhythm. GI: Abdomen soft and nontender. No organomegaly. No mass. Vascular: No leg edema. Skin: Erythematous rash scattered over the back.  Port-A-Cath without erythema.  Lab Results:  Lab Results  Component Value Date   WBC 2.6 (L) 01/30/2016   HGB 10.6 (L) 01/30/2016   HCT 32.7 (L) 01/30/2016   MCV 82.7 01/30/2016   PLT 254 01/30/2016   NEUTROABS 1.8 01/30/2016    Imaging:  No results found.  Medications: I have reviewed the patient's current medications.  Assessment/Plan: 1. Metastatic rectal cancer diagnosed July 2014. MSI stable on the March 2015 resection specimen. Foundation 1 Testing confirmed a KRAS G13 mutation, APC mutation, and BRIP1 mutation.  Colonoscopy on 10/25/2012 showed a bulky circumferential friable mass lesion in the rectum at approximately 4 cm above the dentate line. The mass was subtotally obstructing and would not permit passage of the colonoscope proximal. Biopsy showed adenocarcinoma.   Staging CT scans 10/25/2012 showed a right lower lobe lung mass measuring 5 x 4.6 cm; 0.8 cm nodule right upper lobe; masslike rectosigmoid wall thickening, surrounding stranding and adjacent lymphadenopathy.   PET scan 11/02/2012  showed hypermetabolism in the area of apparent circumferential rectal wall thickening; the 5 cm right lower lobe pulmonary mass was hypermetabolic with SUV max equal 8. No other hypermetabolic lung lesions were evident. Small bilateral pulmonary parenchymal nodules were noted.   She completed 5 cycles of FOLFOX chemotherapy 11/22/2012 through 02/13/2013.   Status post right thoracotomy, bilobectomy (right lower and middle lobectomy) with en bloc resection of the diaphragm 02/27/2013. Pathology showed adenocarcinoma consistent with metastatic colorectal adenocarcinoma, 4.3 cm, negative margins, no lymph node involvement. Negative soft tissue biopsy of the diaphragm x2.Positive for K-ras mutation-codon 13   She completed concurrent radiation and Xeloda for treatment of the rectal tumor 03/06/2013 through 04/11/2013.   Status post low anterior resection with a diverting loop ileostomy on 05/31/2013 at [facility] in [place]. Final pathology showed moderately differentiated adenocarcinoma located in the upper rectum; the tumor measured 2.5 x 2.2 cm; there was invasion through the muscularis propria into pericolic adipose tissue; tumor budding was present. Lymphatic status negative; vascular status negative; proximal and distal surgical margins negative; 2 of 17 lymph nodes showed metastatic adenocarcinoma. Mesenteric tumor deposits were present. Perineural invasion present. [facility] confirmed an additional positive [facility] node and the tumor was staged as a T4a   CT abdomen/pelvis 05/12/2013. Metastatic nodules in the lung increased. Chronic loculated right base pneumothorax. Diffuse mucosal thickening of the sigmoid portion of the colon.   Restaging CTs 07/25/2013 with enlargement of bilateral lung nodules, no other evidence of metastatic disease   Ileostomy takedown 08/14/2013   Chest CT 10/02/2013 revealed enlargement of bilateral lung nodules and increased right pleural fluid      Cycle 1 of FOLFIRI/Avastin 10/03/2013   Cycle 6 of FOLFIRI/Avastin 12/12/2013   Restaging CT 12/22/2013 with a decrease in the size of bilateral lung nodules, no evidence of disease progression   Cycle 7 FOLFIRI/Avastin 12/25/2013  Cycle 8 FOLFIRI/Avastin 01/10/2014  Cycle 9 FOLFIRI/Avastin 01/24/2014  Cycle 10 FOLFIRI/Avastin 02/07/2014  Cycle of 11 FOLFIRI/Avastin 02/21/2014  Restaging CT 03/05/2014 with a decrease in the size of 2 dominant right lung nodules, no new nodules, new pleural gas collection at the right apex  Cycle 12 FOLFIRI/Avastin 03/07/2014  Cycle 13 FOLFIRI/Avastin 03/20/2014  Cycle 14 FOLFIRI/Avastin 04/04/2014  Initiation of maintenance Xeloda/Avastin 04/18/2014  CT chest 07/09/2014 revealed slight enlargement of lung nodules  Treatment with single agent Avastin 07/10/2014  Resumption of FOLFIRI/Avastin on a 2 week schedule 07/25/2014  CT angio chest 09/14/2014 with essentially stable multifocal pulmonary metastatic disease.  5-fluorouracil dose reduced beginning 09/19/2014  FOLFIRI/Avastin switch to a 3 week schedule beginning with treatment 10/10/2014 secondary to malaise, diarrhea, and mucositis, irinotecan dose reduced  CT 12/26/2014 confirmed an increase in the size of bilateral lung nodules  FOLFOX/Avastin initiated on a 2 week schedule 01/02/2015  CT chest 03/20/2015 with slight enlargement of multiple lung nodules  Enrollment on an immunotherapy trial at [facility] March 2017-treatment began with Nivolumab, Ipilumumab, and cobimetinib 06/11/2015  Treatment placed on hold due to a skin rash 09/30/2015  Restaging CT scans 10/28/2015 with interval increase in the size of multiple bilateral pulmonary nodules and masses, increased size of right adrenal mass  Treatment resumed 10/29/2015  Restaging CTs at [facility] 12/11/2015-stable lung nodules/masses, 2 new liver lesions suspicious for metastases, stable adrenal mass   Taken off of the [facility]  study secondary to disease progression  Cycle 1 Lonsurf 01/13/2016 2. Status post right VATS, drainage of pleural effusion, visceral and parietal pleural decortication 12/21/2012. Pleural fluid culture positive for viridans Streptococcus. Right pleura biopsies negative for malignancy. Findings favored empyema. 3. Diabetes. 4. Frequent bowel movements, diminished pelvic muscle tone-we made a referral to the pelvic physical therapy clinic 5. Pain in the left leg with tenderness at the left calf and left ischium-MRI of the lumbar spine and pelvis on 10/13/2011 with no evidence of metastatic disease. A right iliac bone "lesion" was felt to most likely represent heterogenous marrow. Edema and enhancement was noted at the left sciatic nerve compared to the right side with no mass lesion 6. Hypertension-on HCTZ 7. Left foot fracture 8. BRIP1 mutation 9. Mucositis most likely related to 5-fluorouracil. Dose reduction of 5 fluorouracil beginning 09/19/2014. 10. Erythematous rash-nivolumab,ipilumumab, or cobimetinib 11. 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 Admission 11/11/2015 with a cecal volvulus-spontaneously resolved   Disposition: Dr. Ferrell appears stable. She has completed 1 cycle of Lonsurf. She will return for a follow-up CBC 02/07/2016 with the plan to begin cycle 2 Lonsurf 02/10/2016 if counts are adequate. She will return for a follow-up visit and labs in one month. She will contact the office in the interim with any problems.  Patient seen with Dr. Sherrill.    Thomas, Lisa ANP/GNP-BC   01/30/2016  12:20 PM  This was a shared visit with Lisa Thomas. Dr. Dibbern appears well. The plan is to proceed with a second cycle of Lonsurf beginning 02/10/2016.  We will plan for a restaging CT after 3 cycles of Lonsurf.  Brad Sherrill, M.D.      

## 2016-01-31 LAB — HEMOGLOBIN A1C
Est. average glucose Bld gHb Est-mCnc: 163 mg/dL
Hemoglobin A1c: 7.3 % — ABNORMAL HIGH (ref 4.8–5.6)

## 2016-02-03 ENCOUNTER — Encounter: Payer: Self-pay | Admitting: Family Medicine

## 2016-02-04 ENCOUNTER — Telehealth: Payer: Self-pay | Admitting: *Deleted

## 2016-02-04 NOTE — Telephone Encounter (Signed)
Message received from patient's sister, Romie Levee questioning no set appt for this Friday for labs.  Per POF from 01/30/16, pt is to have labs this Friday, 02/07/16.  Message sent to scheduling to address schedule needs sent on 01/30/16.

## 2016-02-07 ENCOUNTER — Ambulatory Visit (HOSPITAL_BASED_OUTPATIENT_CLINIC_OR_DEPARTMENT_OTHER): Payer: Medicare Other

## 2016-02-07 ENCOUNTER — Other Ambulatory Visit (HOSPITAL_BASED_OUTPATIENT_CLINIC_OR_DEPARTMENT_OTHER): Payer: Medicare Other

## 2016-02-07 ENCOUNTER — Other Ambulatory Visit: Payer: Self-pay | Admitting: Nurse Practitioner

## 2016-02-07 ENCOUNTER — Telehealth: Payer: Self-pay | Admitting: Nurse Practitioner

## 2016-02-07 ENCOUNTER — Telehealth: Payer: Self-pay | Admitting: *Deleted

## 2016-02-07 VITALS — BP 161/79 | HR 80 | Temp 98.1°F | Resp 18

## 2016-02-07 DIAGNOSIS — Z95828 Presence of other vascular implants and grafts: Secondary | ICD-10-CM

## 2016-02-07 DIAGNOSIS — C7800 Secondary malignant neoplasm of unspecified lung: Secondary | ICD-10-CM

## 2016-02-07 DIAGNOSIS — C7802 Secondary malignant neoplasm of left lung: Secondary | ICD-10-CM

## 2016-02-07 DIAGNOSIS — C20 Malignant neoplasm of rectum: Secondary | ICD-10-CM

## 2016-02-07 DIAGNOSIS — C7801 Secondary malignant neoplasm of right lung: Secondary | ICD-10-CM

## 2016-02-07 LAB — COMPREHENSIVE METABOLIC PANEL
ALBUMIN: 3.7 g/dL (ref 3.5–5.0)
ALK PHOS: 89 U/L (ref 40–150)
ALT: 26 U/L (ref 0–55)
ANION GAP: 10 meq/L (ref 3–11)
AST: 14 U/L (ref 5–34)
BUN: 14.9 mg/dL (ref 7.0–26.0)
CO2: 24 mEq/L (ref 22–29)
Calcium: 10.3 mg/dL (ref 8.4–10.4)
Chloride: 104 mEq/L (ref 98–109)
Creatinine: 0.6 mg/dL (ref 0.6–1.1)
EGFR: 89 mL/min/{1.73_m2} — AB (ref >90)
GLUCOSE: 121 mg/dL (ref 70–140)
POTASSIUM: 3.7 meq/L (ref 3.5–5.1)
SODIUM: 137 meq/L (ref 136–145)
Total Bilirubin: 0.35 mg/dL (ref 0.20–1.20)
Total Protein: 7.3 g/dL (ref 6.4–8.3)

## 2016-02-07 LAB — CBC WITH DIFFERENTIAL/PLATELET
BASO%: 0.6 % (ref 0.0–2.0)
BASOS ABS: 0 10*3/uL (ref 0.0–0.1)
EOS ABS: 0 10*3/uL (ref 0.0–0.5)
EOS%: 2.3 % (ref 0.0–7.0)
HCT: 31.1 % — ABNORMAL LOW (ref 34.8–46.6)
HEMOGLOBIN: 9.9 g/dL — AB (ref 11.6–15.9)
LYMPH%: 34.7 % (ref 14.0–49.7)
MCH: 27 pg (ref 25.1–34.0)
MCHC: 31.8 g/dL (ref 31.5–36.0)
MCV: 84.7 fL (ref 79.5–101.0)
MONO#: 0.3 10*3/uL (ref 0.1–0.9)
MONO%: 18.5 % — AB (ref 0.0–14.0)
NEUT#: 0.8 10*3/uL — ABNORMAL LOW (ref 1.5–6.5)
NEUT%: 43.9 % (ref 38.4–76.8)
PLATELETS: 216 10*3/uL (ref 145–400)
RBC: 3.67 10*6/uL — ABNORMAL LOW (ref 3.70–5.45)
RDW: 19 % — AB (ref 11.2–14.5)
WBC: 1.7 10*3/uL — ABNORMAL LOW (ref 3.9–10.3)
lymph#: 0.6 10*3/uL — ABNORMAL LOW (ref 0.9–3.3)

## 2016-02-07 MED ORDER — ESTRADIOL 0.0375 MG/24HR TD PTTW: 1.0000 | MEDICATED_PATCH | TRANSDERMAL | 1 refills | Status: AC

## 2016-02-07 MED ORDER — SODIUM CHLORIDE 0.9 % IJ SOLN
10.0000 mL | INTRAMUSCULAR | Status: DC | PRN
  Administered 2016-02-07: 10 mL via INTRAVENOUS
  Filled 2016-02-07: qty 10

## 2016-02-07 MED ORDER — HEPARIN SOD (PORK) LOCK FLUSH 100 UNIT/ML IV SOLN
500.0000 [IU] | Freq: Once | INTRAVENOUS | Status: AC | PRN
  Administered 2016-02-07: 500 [IU] via INTRAVENOUS
  Filled 2016-02-07: qty 5

## 2016-02-07 NOTE — Patient Instructions (Signed)

## 2016-02-07 NOTE — Telephone Encounter (Signed)
I notified Dr. Frutoso Chase the labs from today show neutropenia. She will not begin the next cycle of Lonsurf on 02/10/2016. She will return for a follow-up CBC in one week.

## 2016-02-07 NOTE — Telephone Encounter (Signed)
Call from pt requesting refill on Estradiol patch. Pt wants to continue this for bone health. Discussed with Dr. Benay Spice: OK to refill. Script sent electronically.

## 2016-02-10 ENCOUNTER — Other Ambulatory Visit: Payer: Self-pay | Admitting: Internal Medicine

## 2016-02-11 ENCOUNTER — Other Ambulatory Visit: Payer: Self-pay | Admitting: *Deleted

## 2016-02-11 MED ORDER — EST ESTROGENS-METHYLTEST 1.25-2.5 MG PO TABS: 1.0000 | ORAL_TABLET | Freq: Every day | ORAL | 0 refills | Status: DC

## 2016-02-11 NOTE — Telephone Encounter (Signed)
Received call from pt requesting refills of regular home meds.   Stated she will see her PCP in 3 weeks;  However, pt will run out of meds.  Per pt,  PCP will not refill until pt is seen.  Meds are: 1.   Metformin  1000 mg   BID    #   60. 2.   Norvasc   10 mg   Daily. 3.   Methyltestosterone    1.25 mg  Daily. Requested these refills to be sent to  [facility]. Pt's    Phone     [phone].

## 2016-02-12 MED ORDER — METFORMIN HCL 1000 MG PO TABS: 1000.0000 mg | ORAL_TABLET | Freq: Two times a day (BID) | ORAL | 1 refills | Status: DC

## 2016-02-12 MED ORDER — AMLODIPINE BESYLATE 10 MG PO TABS: 10.0000 mg | ORAL_TABLET | Freq: Every day | ORAL | 1 refills | Status: DC

## 2016-02-12 NOTE — Addendum Note (Signed)
Addended by: Brien Few on: 02/12/2016 03:18 PM   Modules accepted: Orders

## 2016-02-12 NOTE — Telephone Encounter (Signed)
Per Dr. Benay Spice: OK to refill maintenance medications. Called pt to inform her refills were sent in. She voiced appreciation.

## 2016-02-13 NOTE — Telephone Encounter (Signed)
Ok x 6 months 

## 2016-02-13 NOTE — Telephone Encounter (Signed)
Sent to the pharmacy by e-scribe for 6 months per [facility].

## 2016-02-14 ENCOUNTER — Telehealth: Payer: Self-pay | Admitting: Nurse Practitioner

## 2016-02-14 ENCOUNTER — Telehealth: Payer: Self-pay | Admitting: *Deleted

## 2016-02-14 ENCOUNTER — Ambulatory Visit (HOSPITAL_BASED_OUTPATIENT_CLINIC_OR_DEPARTMENT_OTHER): Payer: Medicare Other

## 2016-02-14 DIAGNOSIS — C20 Malignant neoplasm of rectum: Secondary | ICD-10-CM

## 2016-02-14 DIAGNOSIS — C7800 Secondary malignant neoplasm of unspecified lung: Principal | ICD-10-CM

## 2016-02-14 LAB — CBC WITH DIFFERENTIAL/PLATELET
BASO%: 1.1 % (ref 0.0–2.0)
Basophils Absolute: 0 10*3/uL (ref 0.0–0.1)
EOS%: 0.8 % (ref 0.0–7.0)
Eosinophils Absolute: 0 10*3/uL (ref 0.0–0.5)
HCT: 33.9 % — ABNORMAL LOW (ref 34.8–46.6)
HGB: 10.9 g/dL — ABNORMAL LOW (ref 11.6–15.9)
LYMPH#: 0.7 10*3/uL — AB (ref 0.9–3.3)
LYMPH%: 21.8 % (ref 14.0–49.7)
MCH: 27.5 pg (ref 25.1–34.0)
MCHC: 32.1 g/dL (ref 31.5–36.0)
MCV: 85.6 fL (ref 79.5–101.0)
MONO#: 0.4 10*3/uL (ref 0.1–0.9)
MONO%: 13.1 % (ref 0.0–14.0)
NEUT%: 63.2 % (ref 38.4–76.8)
NEUTROS ABS: 2.1 10*3/uL (ref 1.5–6.5)
Platelets: 249 10*3/uL (ref 145–400)
RBC: 3.96 10*6/uL (ref 3.70–5.45)
RDW: 20.5 % — ABNORMAL HIGH (ref 11.2–14.5)
WBC: 3.4 10*3/uL — AB (ref 3.9–10.3)

## 2016-02-14 NOTE — Telephone Encounter (Signed)
I spoke with Dr. Laroy Apple sister regarding today's labs. Blood counts are adequate to proceed with cycle 2 lonsurf beginning 02/17/2016. She will relay the message to Dr. [name].

## 2016-02-14 NOTE — Telephone Encounter (Signed)
Left message at home # for pt to begin Lonsurf on 11/20 as planned. Labs look better.

## 2016-02-19 ENCOUNTER — Telehealth: Payer: Self-pay

## 2016-02-19 NOTE — Telephone Encounter (Signed)
Sister Jones Skene called to verify pt appt date/times. Done.

## 2016-02-24 ENCOUNTER — Ambulatory Visit (HOSPITAL_BASED_OUTPATIENT_CLINIC_OR_DEPARTMENT_OTHER): Payer: Medicare Other | Admitting: Oncology

## 2016-02-24 ENCOUNTER — Telehealth: Payer: Self-pay | Admitting: *Deleted

## 2016-02-24 DIAGNOSIS — R079 Chest pain, unspecified: Secondary | ICD-10-CM

## 2016-02-24 DIAGNOSIS — C7800 Secondary malignant neoplasm of unspecified lung: Secondary | ICD-10-CM

## 2016-02-24 DIAGNOSIS — Z23 Encounter for immunization: Secondary | ICD-10-CM

## 2016-02-24 DIAGNOSIS — C20 Malignant neoplasm of rectum: Secondary | ICD-10-CM

## 2016-02-24 MED ORDER — OXYCODONE-ACETAMINOPHEN 10-325 MG PO TABS: 1.0000 | ORAL_TABLET | ORAL | 0 refills | Status: DC | PRN

## 2016-02-24 NOTE — Telephone Encounter (Signed)
"  I have sharp pain that started over the weekend to the right chest area at diaphragm under the rib cage.  This is not cardiac pain.  Rate as a 4 to 5 on pain scale.  Using oxycodone- APAP every four hours and at the rate I'm going I'll run out today.  Pain doe not go away but goes down to a 2 to 3 on the pain scale.  Do I need to come in?" Asked what time she can come in today.  "I do not have transportation.  My sister is caring for our 73 yr old mother.  I'll have to work something out.  Return number home: [phone] or cell: [phone].

## 2016-02-24 NOTE — Telephone Encounter (Signed)
Per Dr. Benay Spice: Work in to be seen today. Returned call to pt, she agrees to appointment today at [facility]

## 2016-02-24 NOTE — Progress Notes (Signed)
Dr [name] is completing a second cycle of Lonsurf.she appears to be tolerating the chemotherapy well. The etiology of the pain at the right costal margin is unclear. 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 She will continue Percocet as needed for pain. She will return as scheduled on 03/03/2016. We will see her sooner as needed.   The plan is to obtain a restaging chest CT after 3 cycles of Lonsurf.  02/24/2016  3:18 PM

## 2016-02-26 ENCOUNTER — Telehealth: Payer: Self-pay | Admitting: *Deleted

## 2016-02-26 ENCOUNTER — Other Ambulatory Visit: Payer: Self-pay | Admitting: Oncology

## 2016-02-26 ENCOUNTER — Ambulatory Visit
Admission: RE | Admit: 2016-02-26 | Discharge: 2016-02-26 | Disposition: A | Discharge status: Home / Self Care | Payer: Medicare Other | Source: Ambulatory Visit | Attending: Oncology | Admitting: Oncology

## 2016-02-26 DIAGNOSIS — C801 Malignant (primary) neoplasm, unspecified: Secondary | ICD-10-CM

## 2016-02-26 NOTE — Telephone Encounter (Signed)
Received [facility] CT on CD via [employer]. Will drop off in radiology to be uploaded.

## 2016-03-03 ENCOUNTER — Ambulatory Visit (HOSPITAL_BASED_OUTPATIENT_CLINIC_OR_DEPARTMENT_OTHER): Payer: Medicare Other | Admitting: Oncology

## 2016-03-03 ENCOUNTER — Other Ambulatory Visit: Payer: Self-pay | Admitting: *Deleted

## 2016-03-03 ENCOUNTER — Other Ambulatory Visit (HOSPITAL_BASED_OUTPATIENT_CLINIC_OR_DEPARTMENT_OTHER): Payer: Medicare Other | Admitting: *Deleted

## 2016-03-03 ENCOUNTER — Telehealth: Payer: Self-pay | Admitting: Oncology

## 2016-03-03 ENCOUNTER — Other Ambulatory Visit (HOSPITAL_BASED_OUTPATIENT_CLINIC_OR_DEPARTMENT_OTHER): Payer: Medicare Other

## 2016-03-03 ENCOUNTER — Ambulatory Visit: Payer: Medicare Other

## 2016-03-03 VITALS — BP 127/57 | HR 82 | Temp 98.0°F | Resp 18 | Ht 64.0 in | Wt 118.0 lb

## 2016-03-03 DIAGNOSIS — I10 Essential (primary) hypertension: Secondary | ICD-10-CM

## 2016-03-03 DIAGNOSIS — C7802 Secondary malignant neoplasm of left lung: Secondary | ICD-10-CM

## 2016-03-03 DIAGNOSIS — C20 Malignant neoplasm of rectum: Secondary | ICD-10-CM

## 2016-03-03 DIAGNOSIS — B029 Zoster without complications: Secondary | ICD-10-CM | POA: Diagnosis not present

## 2016-03-03 DIAGNOSIS — C7650 Malignant neoplasm of unspecified lower limb: Secondary | ICD-10-CM

## 2016-03-03 DIAGNOSIS — C7801 Secondary malignant neoplasm of right lung: Secondary | ICD-10-CM

## 2016-03-03 DIAGNOSIS — C7800 Secondary malignant neoplasm of unspecified lung: Secondary | ICD-10-CM

## 2016-03-03 DIAGNOSIS — D709 Neutropenia, unspecified: Secondary | ICD-10-CM

## 2016-03-03 DIAGNOSIS — Z95828 Presence of other vascular implants and grafts: Secondary | ICD-10-CM

## 2016-03-03 LAB — COMPREHENSIVE METABOLIC PANEL
ALT: 49 U/L (ref 0–55)
ANION GAP: 10 meq/L (ref 3–11)
AST: 30 U/L (ref 5–34)
Albumin: 3.8 g/dL (ref 3.5–5.0)
Alkaline Phosphatase: 88 U/L (ref 40–150)
BUN: 17.3 mg/dL (ref 7.0–26.0)
CHLORIDE: 100 meq/L (ref 98–109)
CO2: 26 meq/L (ref 22–29)
CREATININE: 0.6 mg/dL (ref 0.6–1.1)
Calcium: 10.1 mg/dL (ref 8.4–10.4)
EGFR: 89 mL/min/{1.73_m2} — ABNORMAL LOW (ref >90)
Glucose: 147 mg/dl — ABNORMAL HIGH (ref 70–140)
Potassium: 3.9 mEq/L (ref 3.5–5.1)
Sodium: 136 mEq/L (ref 136–145)
Total Bilirubin: 0.42 mg/dL (ref 0.20–1.20)
Total Protein: 7.3 g/dL (ref 6.4–8.3)

## 2016-03-03 LAB — CBC WITH DIFFERENTIAL/PLATELET
BASO%: 0.5 % (ref 0.0–2.0)
Basophils Absolute: 0 10*3/uL (ref 0.0–0.1)
EOS%: 1.3 % (ref 0.0–7.0)
Eosinophils Absolute: 0 10*3/uL (ref 0.0–0.5)
HCT: 29.7 % — ABNORMAL LOW (ref 34.8–46.6)
HGB: 9.7 g/dL — ABNORMAL LOW (ref 11.6–15.9)
LYMPH%: 15.6 % (ref 14.0–49.7)
MCH: 27.6 pg (ref 25.1–34.0)
MCHC: 32.6 g/dL (ref 31.5–36.0)
MCV: 84.4 fL (ref 79.5–101.0)
MONO#: 0.1 10*3/uL (ref 0.1–0.9)
MONO%: 4.8 % (ref 0.0–14.0)
NEUT#: 1.3 10*3/uL — ABNORMAL LOW (ref 1.5–6.5)
NEUT%: 77.8 % — AB (ref 38.4–76.8)
PLATELETS: 145 10*3/uL (ref 145–400)
RBC: 3.51 10*6/uL — AB (ref 3.70–5.45)
RDW: 19.4 % — ABNORMAL HIGH (ref 11.2–14.5)
WBC: 1.7 10*3/uL — ABNORMAL LOW (ref 3.9–10.3)
lymph#: 0.3 10*3/uL — ABNORMAL LOW (ref 0.9–3.3)

## 2016-03-03 LAB — CEA (IN HOUSE-CHCC): CEA (CHCC-In House): 77.06 ng/mL — ABNORMAL HIGH (ref 0.00–5.00)

## 2016-03-03 LAB — TECHNOLOGIST REVIEW

## 2016-03-03 MED ORDER — VALACYCLOVIR HCL 1 G PO TABS: 1000.0000 mg | ORAL_TABLET | Freq: Three times a day (TID) | ORAL | 0 refills | Status: DC

## 2016-03-03 MED ORDER — HEPARIN SOD (PORK) LOCK FLUSH 100 UNIT/ML IV SOLN
500.0000 [IU] | Freq: Once | INTRAVENOUS | Status: AC
  Administered 2016-03-03: 500 [IU] via INTRAVENOUS
  Filled 2016-03-03: qty 5

## 2016-03-03 MED ORDER — SODIUM CHLORIDE 0.9 % IJ SOLN
10.0000 mL | INTRAMUSCULAR | Status: DC | PRN
  Administered 2016-03-03: 10 mL via INTRAVENOUS
  Filled 2016-03-03: qty 10

## 2016-03-03 MED ORDER — SODIUM CHLORIDE 0.9% FLUSH
10.0000 mL | INTRAVENOUS | Status: DC | PRN
  Administered 2016-03-03: 10 mL via INTRAVENOUS
  Filled 2016-03-03: qty 10

## 2016-03-03 NOTE — Progress Notes (Signed)
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 The right costal pain has resolved, but she has developed a rash at the right anterior and posterior chest wall. She feels that she may have "shingles ". She continues to have irregular bowel habits, but no frank diarrhea. She completed the second cycle of Lonsurf beginning 02/17/2016.  Objective:  Vital signs in last 24 hours:  Blood pressure (!) 127/57, pulse 82, temperature 98 F (36.7 C), temperature source Oral, resp. rate 18, height 5' 4" (1.626 m), weight 118 lb (53.5 kg), SpO2 98 %.    HEENT: No thrush Resp: Decreased breath sounds at the right lower chest, no respiratory distress Cardio: Regular rate and rhythm GI: No hepatomegaly, nontender, no mass Vascular: No leg edema  Skin: Vesicular rash at the right lower posterior and anterior chest wall   Portacath/PICC-without erythema  Lab Results:  Lab Results  Component Value Date   WBC 1.7 (L) 03/03/2016   HGB 9.7 (L) 03/03/2016   HCT 29.7 (L) 03/03/2016   MCV 84.4 03/03/2016   PLT 145 03/03/2016   NEUTROABS 1.3 (L) 03/03/2016     Medications: I have reviewed the patient's current medications.  Assessment/Plan: 1. Metastatic rectal cancer diagnosed July 2014. MSI stable on the March 2015 resection specimen. Foundation 1 Testing confirmed a KRAS G13 mutation, APC mutation, and BRIP1 mutation.  Colonoscopy on 10/25/2012 showed a bulky circumferential friable mass lesion in the rectum at approximately 4 cm above the dentate line. The mass was subtotally obstructing and would not permit passage of the colonoscope proximal. Biopsy showed adenocarcinoma.   Staging CT scans 10/25/2012 showed a right lower lobe lung mass measuring 5 x 4.6 cm; 0.8 cm nodule right upper lobe; masslike rectosigmoid wall thickening, surrounding stranding and adjacent lymphadenopathy.   PET scan 11/02/2012  showed hypermetabolism in the area of apparent circumferential rectal wall thickening; the 5 cm right lower lobe pulmonary mass was hypermetabolic with SUV max equal 8. No other hypermetabolic lung lesions were evident. Small bilateral pulmonary parenchymal nodules were noted.   She completed 5 cycles of FOLFOX chemotherapy 11/22/2012 through 02/13/2013.   Status post right thoracotomy, bilobectomy (right lower and middle lobectomy) with en bloc resection of the diaphragm 02/27/2013. Pathology showed adenocarcinoma consistent with metastatic colorectal adenocarcinoma, 4.3 cm, negative margins, no lymph node involvement. Negative soft tissue biopsy of the diaphragm x2.Positive for K-ras mutation-codon 13   She completed concurrent radiation and Xeloda for treatment of the rectal tumor 03/06/2013 through 04/11/2013.   Status post low anterior resection with a diverting loop ileostomy on 05/31/2013 at [facility] in [place]. Final pathology showed moderately differentiated adenocarcinoma located in the upper rectum; the tumor measured 2.5 x 2.2 cm; there was invasion through the muscularis propria into pericolic adipose tissue; tumor budding was present. Lymphatic status negative; vascular status negative; proximal and distal surgical margins negative; 2 of 17 lymph nodes showed metastatic adenocarcinoma. Mesenteric tumor deposits were present. Perineural invasion present. [facility] an additional positive lymph node and the tumor was staged as a T4a   CT abdomen/pelvis 05/12/2013. Metastatic nodules in the lung increased. Chronic loculated right base pneumothorax. Diffuse mucosal thickening of the sigmoid portion of the colon.   Restaging CTs 07/25/2013 with enlargement of bilateral lung nodules, no other evidence of metastatic disease   Ileostomy takedown 08/14/2013   Chest CT 10/02/2013 revealed enlargement of bilateral lung nodules and increased  right pleural fluid    Cycle 1 of FOLFIRI/Avastin 10/03/2013   Cycle 6 of FOLFIRI/Avastin 12/12/2013   Restaging CT 12/22/2013 with a decrease in the size of bilateral lung nodules, no evidence of disease progression   Cycle 7 FOLFIRI/Avastin 12/25/2013  Cycle 8 FOLFIRI/Avastin 01/10/2014  Cycle 9 FOLFIRI/Avastin 01/24/2014  Cycle 10 FOLFIRI/Avastin 02/07/2014  Cycle of 11 FOLFIRI/Avastin 02/21/2014  Restaging CT 03/05/2014 with a decrease in the size of 2 dominant right lung nodules, no new nodules, new pleural gas collection at the right apex  Cycle 12 FOLFIRI/Avastin 03/07/2014  Cycle 13 FOLFIRI/Avastin 03/20/2014  Cycle 14 FOLFIRI/Avastin 04/04/2014  Initiation of maintenance Xeloda/Avastin 04/18/2014  CT chest 07/09/2014 revealed slight enlargement of lung nodules  Treatment with single agent Avastin 07/10/2014  Resumption of FOLFIRI/Avastin on a 2 week schedule 07/25/2014  CT angio chest 09/14/2014 with essentially stable multifocal pulmonary metastatic disease.  5-fluorouracil dose reduced beginning 09/19/2014  FOLFIRI/Avastin switch to a 3 week schedule beginning with treatment 10/10/2014 secondary to malaise, diarrhea, and mucositis, irinotecan dose reduced  CT 12/26/2014 confirmed an increase in the size of bilateral lung nodules  FOLFOX/Avastin initiated on a 2 week schedule 01/02/2015  CT chest 03/20/2015 with slight enlargement of multiple lung nodules  Enrollment on an immunotherapy trial at [facility] March 2017-treatment began with Nivolumab, Ipilumumab, and cobimetinib 06/11/2015  Treatment placed on hold due to a skin rash 09/30/2015  Restaging CT scans 10/28/2015 with interval increase in the size of multiple bilateral pulmonary nodules and masses, increased size of right adrenal mass  Treatment resumed 10/29/2015  [facility]aging CTs at [facility] 12/11/2015-stable lung nodules/masses, 2 new liver lesions suspicious for metastases, stable adrenal mass   Taken off of the [facility]  study secondary to disease progression  Cycle 1 Lonsurf 01/13/2016  Cycle 2 Lonsurf 02/17/2016 2. Status post right VATS, drainage of pleural effusion, visceral and parietal pleural decortication 12/21/2012. Pleural fluid culture positive for viridans Streptococcus. Right pleura biopsies negative for malignancy. Findings favored empyema. 3. Diabetes. 4. Frequent bowel movements, diminished pelvic muscle tone-we made a referral to the pelvic physical therapy clinic 5. Pain in the left leg with tenderness at the left calf and left ischium-MRI of the lumbar spine and pelvis on 10/13/2011 with no evidence of metastatic disease. A right iliac bone "lesion" was felt to most likely represent heterogenous marrow. Edema and enhancement was noted at the left sciatic nerve compared to the right side with no mass lesion 6. Hypertension-on HCTZ 7. Left foot fracture 8. BRIP1 mutation 9. Mucositis most likely related to 5-fluorouracil. Dose reduction of 5 fluorouracil beginning 09/19/2014. 10. Erythematous rash-nivolumab,ipilumumab, or cobimetinib 11. 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 Admission 11/11/2015 with a cecal volvulus-spontaneously resolved 13. 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 She has completed 2 cycles of Lonsurf. She is tolerating the treatment well. She has mild neutropenia today. She will complete a course of Valtrex for treatment of the zoster rash. Dr. [name] will return for a CBC on 03/13/2016 per the plan is to initiate a third cycle of Lonsurf on 03/16/2016.  She will be scheduled for a restaging CT 04/08/2016 and an office visit 04/10/2016.  Approximately 25 minutes were spent with the patient. Betsy Coder, MD  03/03/2016  10:18 AM

## 2016-03-03 NOTE — Telephone Encounter (Signed)
Appointments scheduled per, 03/03/16 los. A copy of the AVS report and appointment schedule was given to the patient, per 03/03/16 los.

## 2016-03-03 NOTE — Patient Instructions (Signed)

## 2016-03-04 LAB — CEA: CEA: 181.8 ng/mL — ABNORMAL HIGH (ref 0.0–4.7)

## 2016-03-09 ENCOUNTER — Telehealth: Payer: Self-pay | Admitting: *Deleted

## 2016-03-09 NOTE — Telephone Encounter (Signed)
Received refill request from [facility] for HCTZ. Per Dr. Benay Spice: DC Hydrochlorothiazide. Called pt with instructions. She voiced understanding. Pt reports shingles rash is improving slowly.

## 2016-03-09 NOTE — Telephone Encounter (Signed)
Message from [place] with [facility] requesting follow up information on patient. Office note faxed electronically.

## 2016-03-12 ENCOUNTER — Telehealth: Payer: Self-pay | Admitting: *Deleted

## 2016-03-12 DIAGNOSIS — C20 Malignant neoplasm of rectum: Secondary | ICD-10-CM

## 2016-03-12 DIAGNOSIS — Z23 Encounter for immunization: Secondary | ICD-10-CM

## 2016-03-12 DIAGNOSIS — C7800 Secondary malignant neoplasm of unspecified lung: Secondary | ICD-10-CM

## 2016-03-12 NOTE — Telephone Encounter (Signed)
"  I'm calling for my sister who comes in tomorrow morning at 10:15 for lab and flush.  She needs a refill on the Oxycodone tomorrow.  Could you also tell Dr. Benay Spice she is in her second week of shingles and they are drying up slowly.  Give him the update that it's healing."

## 2016-03-13 ENCOUNTER — Other Ambulatory Visit (HOSPITAL_BASED_OUTPATIENT_CLINIC_OR_DEPARTMENT_OTHER): Payer: Medicare Other

## 2016-03-13 ENCOUNTER — Ambulatory Visit (HOSPITAL_BASED_OUTPATIENT_CLINIC_OR_DEPARTMENT_OTHER): Payer: Medicare Other

## 2016-03-13 ENCOUNTER — Telehealth: Payer: Self-pay | Admitting: *Deleted

## 2016-03-13 DIAGNOSIS — C7650 Malignant neoplasm of unspecified lower limb: Secondary | ICD-10-CM

## 2016-03-13 DIAGNOSIS — C7801 Secondary malignant neoplasm of right lung: Secondary | ICD-10-CM | POA: Diagnosis not present

## 2016-03-13 DIAGNOSIS — C7802 Secondary malignant neoplasm of left lung: Secondary | ICD-10-CM

## 2016-03-13 DIAGNOSIS — C20 Malignant neoplasm of rectum: Secondary | ICD-10-CM

## 2016-03-13 DIAGNOSIS — C7800 Secondary malignant neoplasm of unspecified lung: Principal | ICD-10-CM

## 2016-03-13 LAB — CBC WITH DIFFERENTIAL/PLATELET
BASO%: 0.6 % (ref 0.0–2.0)
BASOS ABS: 0 10*3/uL (ref 0.0–0.1)
EOS ABS: 0 10*3/uL (ref 0.0–0.5)
EOS%: 1.2 % (ref 0.0–7.0)
HEMATOCRIT: 29.7 % — AB (ref 34.8–46.6)
HGB: 9.9 g/dL — ABNORMAL LOW (ref 11.6–15.9)
LYMPH%: 43.7 % (ref 14.0–49.7)
MCH: 28.3 pg (ref 25.1–34.0)
MCHC: 33.3 g/dL (ref 31.5–36.0)
MCV: 84.9 fL (ref 79.5–101.0)
MONO#: 0.4 10*3/uL (ref 0.1–0.9)
MONO%: 23.4 % — ABNORMAL HIGH (ref 0.0–14.0)
NEUT%: 31.1 % — ABNORMAL LOW (ref 38.4–76.8)
NEUTROS ABS: 0.5 10*3/uL — AB (ref 1.5–6.5)
NRBC: 0 % (ref 0–0)
PLATELETS: 308 10*3/uL (ref 145–400)
RBC: 3.5 10*6/uL — ABNORMAL LOW (ref 3.70–5.45)
RDW: 20.6 % — ABNORMAL HIGH (ref 11.2–14.5)
WBC: 1.7 10*3/uL — AB (ref 3.9–10.3)
lymph#: 0.7 10*3/uL — ABNORMAL LOW (ref 0.9–3.3)

## 2016-03-13 MED ORDER — SODIUM CHLORIDE 0.9% FLUSH
10.0000 mL | INTRAVENOUS | Status: AC | PRN
  Administered 2016-03-13: 10 mL
  Filled 2016-03-13: qty 10

## 2016-03-13 MED ORDER — OXYCODONE-ACETAMINOPHEN 10-325 MG PO TABS: 1.0000 | ORAL_TABLET | ORAL | 0 refills | Status: DC | PRN

## 2016-03-13 MED ORDER — HEPARIN SOD (PORK) LOCK FLUSH 100 UNIT/ML IV SOLN
500.0000 [IU] | INTRAVENOUS | Status: AC | PRN
  Administered 2016-03-13: 500 [IU]
  Filled 2016-03-13: qty 5

## 2016-03-13 NOTE — Telephone Encounter (Signed)
ANC 0.5. Per Dr. Benay Spice: [name]. Check lab early next week. Call office for fever/ chills or other signs of infection.

## 2016-03-13 NOTE — Telephone Encounter (Signed)
Called pt with instructions to [place]. Neutropenic precautions reviewed, call for signs of infection. She voiced understanding. Message to schedulers to call pt with lab appt for next week.

## 2016-03-13 NOTE — Telephone Encounter (Signed)
Script in book for pick up.

## 2016-03-16 ENCOUNTER — Other Ambulatory Visit: Payer: Self-pay | Admitting: *Deleted

## 2016-03-16 DIAGNOSIS — C7800 Secondary malignant neoplasm of unspecified lung: Principal | ICD-10-CM

## 2016-03-16 DIAGNOSIS — C20 Malignant neoplasm of rectum: Secondary | ICD-10-CM

## 2016-03-16 NOTE — Telephone Encounter (Signed)
Second call for prescription pick up tomorrow.  Informed her the oxycodone prescription is in the book ready for pick up tomorrow.

## 2016-03-17 ENCOUNTER — Other Ambulatory Visit (HOSPITAL_BASED_OUTPATIENT_CLINIC_OR_DEPARTMENT_OTHER): Payer: Medicare Other

## 2016-03-17 ENCOUNTER — Telehealth: Payer: Self-pay

## 2016-03-17 ENCOUNTER — Ambulatory Visit (HOSPITAL_BASED_OUTPATIENT_CLINIC_OR_DEPARTMENT_OTHER): Payer: Medicare Other

## 2016-03-17 DIAGNOSIS — Z95828 Presence of other vascular implants and grafts: Secondary | ICD-10-CM

## 2016-03-17 DIAGNOSIS — C7801 Secondary malignant neoplasm of right lung: Secondary | ICD-10-CM

## 2016-03-17 DIAGNOSIS — C7650 Malignant neoplasm of unspecified lower limb: Secondary | ICD-10-CM

## 2016-03-17 DIAGNOSIS — C20 Malignant neoplasm of rectum: Secondary | ICD-10-CM

## 2016-03-17 DIAGNOSIS — C7800 Secondary malignant neoplasm of unspecified lung: Principal | ICD-10-CM

## 2016-03-17 DIAGNOSIS — C7802 Secondary malignant neoplasm of left lung: Secondary | ICD-10-CM | POA: Diagnosis not present

## 2016-03-17 LAB — CBC WITH DIFFERENTIAL/PLATELET
BASO%: 0.4 % (ref 0.0–2.0)
BASOS ABS: 0 10*3/uL (ref 0.0–0.1)
EOS ABS: 0 10*3/uL (ref 0.0–0.5)
EOS%: 0.4 % (ref 0.0–7.0)
HCT: 30.1 % — ABNORMAL LOW (ref 34.8–46.6)
HGB: 9.9 g/dL — ABNORMAL LOW (ref 11.6–15.9)
LYMPH%: 28.8 % (ref 14.0–49.7)
MCH: 28.6 pg (ref 25.1–34.0)
MCHC: 32.9 g/dL (ref 31.5–36.0)
MCV: 87 fL (ref 79.5–101.0)
MONO#: 0.5 10*3/uL (ref 0.1–0.9)
MONO%: 18.7 % — AB (ref 0.0–14.0)
NEUT%: 51.7 % (ref 38.4–76.8)
NEUTROS ABS: 1.3 10*3/uL — AB (ref 1.5–6.5)
PLATELETS: 250 10*3/uL (ref 145–400)
RBC: 3.46 10*6/uL — AB (ref 3.70–5.45)
RDW: 22.1 % — ABNORMAL HIGH (ref 11.2–14.5)
WBC: 2.6 10*3/uL — AB (ref 3.9–10.3)
lymph#: 0.7 10*3/uL — ABNORMAL LOW (ref 0.9–3.3)
nRBC: 0 % (ref 0–0)

## 2016-03-17 MED ORDER — SODIUM CHLORIDE 0.9 % IJ SOLN
10.0000 mL | INTRAMUSCULAR | Status: DC | PRN
Start: 2016-03-17 — End: 2016-03-17
  Administered 2016-03-17: 10 mL via INTRAVENOUS
  Filled 2016-03-17: qty 10

## 2016-03-17 MED ORDER — HEPARIN SOD (PORK) LOCK FLUSH 100 UNIT/ML IV SOLN
500.0000 [IU] | Freq: Once | INTRAVENOUS | Status: AC | PRN
  Administered 2016-03-17: 500 [IU] via INTRAVENOUS
  Filled 2016-03-17: qty 5

## 2016-03-17 NOTE — Patient Instructions (Signed)

## 2016-03-17 NOTE — Telephone Encounter (Signed)
Dr Julien Nordmann stated it was OK to start [place]. 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 And feeling very weak and tired. It takes all her energy to get out of bed and get dressed for breakfast. She decided not to take lonsurf. She is requesting labs on 26th. And will restart lonsurf at that time after Dr Benay Spice checks labs. inbasket sent.

## 2016-03-18 ENCOUNTER — Telehealth: Payer: Self-pay

## 2016-03-18 NOTE — Telephone Encounter (Signed)
-----   Message from Owens Shark, NP sent at 03/18/2016  1:25 PM EST ----- Check cbc 12/22 please

## 2016-03-18 NOTE — Telephone Encounter (Signed)
Called pt, spoke with patients sister- pt to have labs 12/22 for CBC check. Scheduling to call pt for time.

## 2016-03-19 ENCOUNTER — Other Ambulatory Visit: Payer: Self-pay | Admitting: *Deleted

## 2016-03-19 DIAGNOSIS — C20 Malignant neoplasm of rectum: Secondary | ICD-10-CM

## 2016-03-19 DIAGNOSIS — C7800 Secondary malignant neoplasm of unspecified lung: Principal | ICD-10-CM

## 2016-03-20 ENCOUNTER — Other Ambulatory Visit: Payer: Self-pay | Admitting: Oncology

## 2016-03-20 ENCOUNTER — Ambulatory Visit (HOSPITAL_BASED_OUTPATIENT_CLINIC_OR_DEPARTMENT_OTHER): Payer: Medicare Other

## 2016-03-20 ENCOUNTER — Telehealth: Payer: Self-pay | Admitting: *Deleted

## 2016-03-20 ENCOUNTER — Other Ambulatory Visit (HOSPITAL_BASED_OUTPATIENT_CLINIC_OR_DEPARTMENT_OTHER): Payer: Medicare Other

## 2016-03-20 ENCOUNTER — Other Ambulatory Visit: Payer: Self-pay | Admitting: *Deleted

## 2016-03-20 DIAGNOSIS — C7650 Malignant neoplasm of unspecified lower limb: Secondary | ICD-10-CM | POA: Diagnosis not present

## 2016-03-20 DIAGNOSIS — Z95828 Presence of other vascular implants and grafts: Secondary | ICD-10-CM

## 2016-03-20 DIAGNOSIS — C7800 Secondary malignant neoplasm of unspecified lung: Principal | ICD-10-CM

## 2016-03-20 DIAGNOSIS — C7801 Secondary malignant neoplasm of right lung: Secondary | ICD-10-CM | POA: Diagnosis not present

## 2016-03-20 DIAGNOSIS — C7802 Secondary malignant neoplasm of left lung: Secondary | ICD-10-CM

## 2016-03-20 DIAGNOSIS — C20 Malignant neoplasm of rectum: Secondary | ICD-10-CM

## 2016-03-20 LAB — CBC WITH DIFFERENTIAL/PLATELET
BASO%: 1 % (ref 0.0–2.0)
Basophils Absolute: 0 10*3/uL (ref 0.0–0.1)
EOS ABS: 0 10*3/uL (ref 0.0–0.5)
EOS%: 0.3 % (ref 0.0–7.0)
HCT: 31.2 % — ABNORMAL LOW (ref 34.8–46.6)
HEMOGLOBIN: 10.3 g/dL — AB (ref 11.6–15.9)
LYMPH%: 20.7 % (ref 14.0–49.7)
MCH: 29.1 pg (ref 25.1–34.0)
MCHC: 33 g/dL (ref 31.5–36.0)
MCV: 88.1 fL (ref 79.5–101.0)
MONO#: 0.5 10*3/uL (ref 0.1–0.9)
MONO%: 11.9 % (ref 0.0–14.0)
NEUT%: 66.1 % (ref 38.4–76.8)
NEUTROS ABS: 2.6 10*3/uL (ref 1.5–6.5)
Platelets: 210 10*3/uL (ref 145–400)
RBC: 3.54 10*6/uL — ABNORMAL LOW (ref 3.70–5.45)
RDW: 22.9 % — AB (ref 11.2–14.5)
WBC: 3.9 10*3/uL (ref 3.9–10.3)
lymph#: 0.8 10*3/uL — ABNORMAL LOW (ref 0.9–3.3)

## 2016-03-20 MED ORDER — HEPARIN SOD (PORK) LOCK FLUSH 100 UNIT/ML IV SOLN
500.0000 [IU] | Freq: Once | INTRAVENOUS | Status: AC | PRN
  Administered 2016-03-20: 500 [IU] via INTRAVENOUS
  Filled 2016-03-20: qty 5

## 2016-03-20 MED ORDER — SODIUM CHLORIDE 0.9 % IJ SOLN
10.0000 mL | INTRAMUSCULAR | Status: DC | PRN
Start: 2016-03-20 — End: 2016-03-20
  Administered 2016-03-20: 10 mL via INTRAVENOUS
  Filled 2016-03-20: qty 10

## 2016-03-20 MED ORDER — TRIFLURIDINE-TIPIRACIL 15-6.14 MG PO TABS: ORAL_TABLET | ORAL | 0 refills | Status: DC

## 2016-03-20 NOTE — Telephone Encounter (Signed)
Call placed to patient to inform her per L. Thomas NP that [place] is higher and to start Lonsurf, but to decrease dose to 3 tablets BID.  Patient states that she would like to start [place] on Monday.  L. Thomas notified of start date per pt. Above information repeated to patient's sister [name] as well.  Patient and patient's sister verbalize an understanding of all information and have no further questions at this time.

## 2016-03-24 ENCOUNTER — Other Ambulatory Visit (HOSPITAL_BASED_OUTPATIENT_CLINIC_OR_DEPARTMENT_OTHER): Payer: Medicare Other

## 2016-03-24 ENCOUNTER — Telehealth: Payer: Self-pay | Admitting: *Deleted

## 2016-03-24 ENCOUNTER — Ambulatory Visit (HOSPITAL_BASED_OUTPATIENT_CLINIC_OR_DEPARTMENT_OTHER): Payer: Medicare Other

## 2016-03-24 ENCOUNTER — Other Ambulatory Visit: Payer: Self-pay | Admitting: *Deleted

## 2016-03-24 DIAGNOSIS — C7801 Secondary malignant neoplasm of right lung: Secondary | ICD-10-CM

## 2016-03-24 DIAGNOSIS — C7802 Secondary malignant neoplasm of left lung: Secondary | ICD-10-CM | POA: Diagnosis not present

## 2016-03-24 DIAGNOSIS — C20 Malignant neoplasm of rectum: Secondary | ICD-10-CM

## 2016-03-24 DIAGNOSIS — C7800 Secondary malignant neoplasm of unspecified lung: Principal | ICD-10-CM

## 2016-03-24 DIAGNOSIS — Z95828 Presence of other vascular implants and grafts: Secondary | ICD-10-CM

## 2016-03-24 DIAGNOSIS — C7650 Malignant neoplasm of unspecified lower limb: Secondary | ICD-10-CM

## 2016-03-24 LAB — CBC WITH DIFFERENTIAL/PLATELET
BASO%: 0.4 % (ref 0.0–2.0)
BASOS ABS: 0 10*3/uL (ref 0.0–0.1)
EOS ABS: 0 10*3/uL (ref 0.0–0.5)
EOS%: 0.4 % (ref 0.0–7.0)
HCT: 31.4 % — ABNORMAL LOW (ref 34.8–46.6)
HEMOGLOBIN: 10.3 g/dL — AB (ref 11.6–15.9)
LYMPH%: 14.5 % (ref 14.0–49.7)
MCH: 29.3 pg (ref 25.1–34.0)
MCHC: 32.8 g/dL (ref 31.5–36.0)
MCV: 89.2 fL (ref 79.5–101.0)
MONO#: 0.6 10*3/uL (ref 0.1–0.9)
MONO%: 10.7 % (ref 0.0–14.0)
NEUT#: 4.2 10*3/uL (ref 1.5–6.5)
NEUT%: 74 % (ref 38.4–76.8)
PLATELETS: 223 10*3/uL (ref 145–400)
RBC: 3.52 10*6/uL — ABNORMAL LOW (ref 3.70–5.45)
RDW: 23.1 % — AB (ref 11.2–14.5)
WBC: 5.7 10*3/uL (ref 3.9–10.3)
lymph#: 0.8 10*3/uL — ABNORMAL LOW (ref 0.9–3.3)

## 2016-03-24 MED ORDER — HEPARIN SOD (PORK) LOCK FLUSH 100 UNIT/ML IV SOLN
500.0000 [IU] | Freq: Once | INTRAVENOUS | Status: AC | PRN
  Administered 2016-03-24: 500 [IU] via INTRAVENOUS
  Filled 2016-03-24: qty 5

## 2016-03-24 MED ORDER — TRIFLURIDINE-TIPIRACIL 15-6.14 MG PO TABS: ORAL_TABLET | ORAL | 0 refills | Status: DC

## 2016-03-24 MED ORDER — SODIUM CHLORIDE 0.9 % IJ SOLN
10.0000 mL | INTRAMUSCULAR | Status: DC | PRN
  Administered 2016-03-24: 10 mL via INTRAVENOUS
  Filled 2016-03-24: qty 10

## 2016-03-24 MED FILL — LONSURF 15 MG-6.14 MG TAB: 15-6.14 | 28 days supply | Qty: 60 | Fill #0

## 2016-03-24 NOTE — Telephone Encounter (Signed)
Call placed to patient's sister, [name] to inform her of CBC results from today.  Pt is currently at the hairdresser and is feeling well per [name].  All questions answered.  Rosemary appreciative of call back.

## 2016-03-24 NOTE — Telephone Encounter (Signed)
"  This is [name] calling for my sister.  She had labs today.  Does not have access to a computer.  Could someone call the lab results to her when they come in today.  Thanks."

## 2016-04-02 ENCOUNTER — Telehealth: Payer: Self-pay

## 2016-04-02 NOTE — Telephone Encounter (Addendum)
Returned call to pt, she reports the lesions have "cooled down" this evening. Declined office visit. "There's nothing to do about it. Just wanted Dr. Benay Spice to be aware." She will call prior to visit if anything changes.

## 2016-04-02 NOTE — Telephone Encounter (Signed)
Pt and sister called. Pt's shingles improved with 1 week course of valtrex but did not go away. They are now flaring up again. Red and painful. 3-4/10 she uses aleve with partial relief. At night it is 5-6/10 and she uses percocet to help and then she can sleep. Shingles are on R waistline in dermatomal pattern. She has labs and CT on 1/10 and sees Dr Benay Spice on 1/12.

## 2016-04-06 ENCOUNTER — Other Ambulatory Visit: Payer: Self-pay | Admitting: Nurse Practitioner

## 2016-04-07 ENCOUNTER — Other Ambulatory Visit: Payer: Self-pay | Admitting: Internal Medicine

## 2016-04-08 ENCOUNTER — Ambulatory Visit (HOSPITAL_COMMUNITY)
Admission: RE | Admit: 2016-04-08 | Discharge: 2016-04-08 | Disposition: A | Discharge status: Home / Self Care | Payer: Medicare Other | Source: Ambulatory Visit | Attending: Oncology | Admitting: Oncology

## 2016-04-08 ENCOUNTER — Ambulatory Visit (HOSPITAL_BASED_OUTPATIENT_CLINIC_OR_DEPARTMENT_OTHER): Payer: Medicare Other

## 2016-04-08 ENCOUNTER — Encounter (HOSPITAL_COMMUNITY): Payer: Self-pay

## 2016-04-08 ENCOUNTER — Other Ambulatory Visit (HOSPITAL_BASED_OUTPATIENT_CLINIC_OR_DEPARTMENT_OTHER): Payer: Medicare Other

## 2016-04-08 DIAGNOSIS — C20 Malignant neoplasm of rectum: Secondary | ICD-10-CM | POA: Diagnosis present

## 2016-04-08 DIAGNOSIS — C7650 Malignant neoplasm of unspecified lower limb: Secondary | ICD-10-CM

## 2016-04-08 DIAGNOSIS — C7800 Secondary malignant neoplasm of unspecified lung: Principal | ICD-10-CM

## 2016-04-08 DIAGNOSIS — E279 Disorder of adrenal gland, unspecified: Secondary | ICD-10-CM | POA: Insufficient documentation

## 2016-04-08 DIAGNOSIS — C7801 Secondary malignant neoplasm of right lung: Secondary | ICD-10-CM | POA: Diagnosis not present

## 2016-04-08 DIAGNOSIS — C787 Secondary malignant neoplasm of liver and intrahepatic bile duct: Secondary | ICD-10-CM | POA: Insufficient documentation

## 2016-04-08 DIAGNOSIS — C7802 Secondary malignant neoplasm of left lung: Secondary | ICD-10-CM | POA: Diagnosis not present

## 2016-04-08 DIAGNOSIS — Z95828 Presence of other vascular implants and grafts: Secondary | ICD-10-CM

## 2016-04-08 DIAGNOSIS — R590 Localized enlarged lymph nodes: Secondary | ICD-10-CM | POA: Insufficient documentation

## 2016-04-08 DIAGNOSIS — R918 Other nonspecific abnormal finding of lung field: Secondary | ICD-10-CM | POA: Diagnosis present

## 2016-04-08 LAB — CBC WITH DIFFERENTIAL/PLATELET
BASO%: 0.6 % (ref 0.0–2.0)
Basophils Absolute: 0 10*3/uL (ref 0.0–0.1)
EOS ABS: 0.1 10*3/uL (ref 0.0–0.5)
EOS%: 3 % (ref 0.0–7.0)
HCT: 29.5 % — ABNORMAL LOW (ref 34.8–46.6)
HGB: 9.8 g/dL — ABNORMAL LOW (ref 11.6–15.9)
LYMPH%: 21.4 % (ref 14.0–49.7)
MCH: 29.3 pg (ref 25.1–34.0)
MCHC: 33.2 g/dL (ref 31.5–36.0)
MCV: 88.3 fL (ref 79.5–101.0)
MONO#: 0.1 10*3/uL (ref 0.1–0.9)
MONO%: 3.3 % (ref 0.0–14.0)
NEUT%: 71.7 % (ref 38.4–76.8)
NEUTROS ABS: 2.4 10*3/uL (ref 1.5–6.5)
NRBC: 0 % (ref 0–0)
Platelets: 167 10*3/uL (ref 145–400)
RBC: 3.34 10*6/uL — AB (ref 3.70–5.45)
RDW: 21.8 % — AB (ref 11.2–14.5)
WBC: 3.3 10*3/uL — AB (ref 3.9–10.3)
lymph#: 0.7 10*3/uL — ABNORMAL LOW (ref 0.9–3.3)

## 2016-04-08 LAB — COMPREHENSIVE METABOLIC PANEL
ALT: 54 U/L (ref 0–55)
AST: 33 U/L (ref 5–34)
Albumin: 3.8 g/dL (ref 3.5–5.0)
Alkaline Phosphatase: 77 U/L (ref 40–150)
Anion Gap: 9 mEq/L (ref 3–11)
BILIRUBIN TOTAL: 0.36 mg/dL (ref 0.20–1.20)
BUN: 19.7 mg/dL (ref 7.0–26.0)
CHLORIDE: 104 meq/L (ref 98–109)
CO2: 27 meq/L (ref 22–29)
CREATININE: 0.7 mg/dL (ref 0.6–1.1)
Calcium: 10.3 mg/dL (ref 8.4–10.4)
EGFR: 89 mL/min/{1.73_m2} — ABNORMAL LOW (ref >90)
GLUCOSE: 129 mg/dL (ref 70–140)
Potassium: 4.2 mEq/L (ref 3.5–5.1)
SODIUM: 141 meq/L (ref 136–145)
TOTAL PROTEIN: 7.2 g/dL (ref 6.4–8.3)

## 2016-04-08 LAB — CEA (IN HOUSE-CHCC): CEA (CHCC-In House): 133.42 ng/mL — ABNORMAL HIGH (ref 0.00–5.00)

## 2016-04-08 MED ORDER — SODIUM CHLORIDE 0.9 % IJ SOLN
10.0000 mL | INTRAMUSCULAR | Status: DC | PRN
  Administered 2016-04-08: 10 mL via INTRAVENOUS
  Filled 2016-04-08: qty 10

## 2016-04-08 MED ORDER — IOPAMIDOL (ISOVUE-300) INJECTION 61%
75.0000 mL | Freq: Once | INTRAVENOUS | Status: AC | PRN
  Administered 2016-04-08: 60 mL via INTRAVENOUS

## 2016-04-08 MED ORDER — IOPAMIDOL (ISOVUE-300) INJECTION 61%
INTRAVENOUS | Status: AC
  Filled 2016-04-08: qty 75

## 2016-04-08 NOTE — Patient Instructions (Signed)

## 2016-04-10 ENCOUNTER — Telehealth: Payer: Self-pay | Admitting: Oncology

## 2016-04-10 ENCOUNTER — Ambulatory Visit (HOSPITAL_BASED_OUTPATIENT_CLINIC_OR_DEPARTMENT_OTHER): Payer: Medicare Other | Admitting: Oncology

## 2016-04-10 DIAGNOSIS — C7801 Secondary malignant neoplasm of right lung: Secondary | ICD-10-CM

## 2016-04-10 DIAGNOSIS — I10 Essential (primary) hypertension: Secondary | ICD-10-CM | POA: Diagnosis not present

## 2016-04-10 DIAGNOSIS — B029 Zoster without complications: Secondary | ICD-10-CM | POA: Diagnosis not present

## 2016-04-10 DIAGNOSIS — C20 Malignant neoplasm of rectum: Secondary | ICD-10-CM

## 2016-04-10 DIAGNOSIS — R634 Abnormal weight loss: Secondary | ICD-10-CM | POA: Diagnosis not present

## 2016-04-10 DIAGNOSIS — Z23 Encounter for immunization: Secondary | ICD-10-CM

## 2016-04-10 DIAGNOSIS — R970 Elevated carcinoembryonic antigen [CEA]: Secondary | ICD-10-CM

## 2016-04-10 DIAGNOSIS — E119 Type 2 diabetes mellitus without complications: Secondary | ICD-10-CM | POA: Diagnosis not present

## 2016-04-10 DIAGNOSIS — C7650 Malignant neoplasm of unspecified lower limb: Secondary | ICD-10-CM | POA: Diagnosis not present

## 2016-04-10 DIAGNOSIS — C7802 Secondary malignant neoplasm of left lung: Secondary | ICD-10-CM | POA: Diagnosis not present

## 2016-04-10 DIAGNOSIS — C7800 Secondary malignant neoplasm of unspecified lung: Secondary | ICD-10-CM

## 2016-04-10 MED ORDER — OXYCODONE-ACETAMINOPHEN 10-325 MG PO TABS: 1.0000 | ORAL_TABLET | ORAL | 0 refills | Status: DC | PRN

## 2016-04-10 NOTE — Progress Notes (Signed)
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 She completed a third cycle of Lonsurf beginning 03/25/2016. The zoster rash at the right chest wall is healing. She continues to have tenderness over the chest wall. She has a good appetite in the mornings, but not later in the day. No new complaint.    Objective:  Vital signs in last 24 hours:  Blood pressure (!) 141/69, pulse (!) 102, temperature 98.5 F (36.9 C), temperature source Oral, resp. rate 18, weight 114 lb 14.4 oz (52.1 kg), SpO2 97 %.  Resp:  decreased breath size of the right lower chest, no respiratory distress Cardio:  regular rate and rhythm GI:  no hepatomegaly, nontender Vascular:  no leg edema  Sk[place] zoster rash at the right posterior lateral chest   Portacath/PICC-without erythema  Lab Results:  Lab Results  Component Value Date   WBC 3.3 (L) 04/08/2016   HGB 9.8 (L) 04/08/2016   HCT 29.5 (L) 04/08/2016   MCV 88.3 04/08/2016   PLT 167 04/08/2016   NEUTROABS 2.4 04/08/2016    CEA-133.42   Imaging:  Ct Chest W Contrast  Result Date: 04/08/2016 CLINICAL DATA:  Followup metastatic rectal carcinoma to lung. Adrenal mass. Ongoing chemotherapy. Previous radiation therapy. EXAM: CT CHEST WITH CONTRAST TECHNIQUE: Multidetector CT imaging of the chest was performed during intravenous contrast administration. CONTRAST:  62m ISOVUE-300 IOPAMIDOL (ISOVUE-300) INJECTION 61% COMPARISON:  [place] Cardiovascular: No acute findings. Aortic and coronary artery atherosclerosis. Mediastinum/Nodes: 8 mm low-attenuation right paratracheal lymph node and 13 mm low-attenuation left hilar lymph noted remains stable. No new lymphadenopathy identified. Lungs/Pleura: Bilateral pulmonary metastases again demonstrated. Increased size of mass in the medial right lower lobe, currently measuring 4.6 x 3.9 cm  on image 66/2 compared with 4.3 x 3.2 cm previously. Pulmonary mass in the left lower lobe currently measures 3.1 x 2.3 cm on image 84/5 compared to 3.0 x 2.1 cm previously. Several new sub-cm pulmonary nodules are also seen in both lungs. This consistent with mild progression of pulmonary metastases. Elevation of right hemidiaphragm and mild right posterolateral pleural thickening remains stable. No evidence of pleural effusion. Upper Abdomen: Heterogeneous low-attenuation right adrenal mass measures 4.7 x 2.4 cm, compared to 4.4 x 2 .3 cm previously, consistent with adrenal metastasis. Increased size and number of several low-attenuation liver lesions also seen, consistent progression of liver metastases. Mild low-attenuation porta hepatis lymphadenopathy shows no significant change. Musculoskeletal:  No suspicious bone lesions. IMPRESSION: Mild progression of bilateral pulmonary metastases. Mild progression of liver metastases. Mildly increased size of right adrenal mass, consistent with metastatic disease. Stable mild mediastinal and left hilar lymphadenopathy. Stable mild right pleural thickening. Electronically Signed   By: JEarle GellM.D.   On: 04/08/2016 14:51    Medications: I have reviewed the patient's current medications.  Assessment/Plan: 1. Metastatic rectal cancer diagnosed July 2014. MSI stable on the March 2015 resection specimen. Foundation 1 Testing confirmed a KRAS G13 mutation, APC mutation, and BRIP1 mutation.  Colonoscopy on 10/25/2012 showed a bulky circumferential friable mass lesion in the rectum at approximately 4 cm above the dentate line. The mass was subtotally obstructing and would not permit passage of the colonoscope proximal. Biopsy showed adenocarcinoma.   Staging CT scans 10/25/2012 showed a right lower lobe lung mass measuring 5 x 4.6 cm; 0.8 cm nodule right upper lobe; masslike rectosigmoid wall thickening, surrounding stranding and adjacent lymphadenopathy.  PET scan 11/02/2012 showed hypermetabolism in the area of apparent circumferential rectal wall thickening; the 5 cm right lower lobe pulmonary mass was hypermetabolic with SUV max equal 8. No other hypermetabolic lung lesions were evident. Small bilateral pulmonary parenchymal nodules were noted.   She completed 5 cycles of FOLFOX chemotherapy 11/22/2012 through 02/13/2013.   Status post right thoracotomy, bilobectomy (right lower and middle lobectomy) with en bloc resection of the diaphragm 02/27/2013. Pathology showed adenocarcinoma consistent with metastatic colorectal adenocarcinoma, 4.3 cm, negative margins, no lymph node involvement. Negative soft tissue biopsy of the diaphragm x2.Positive for K-ras mutation-codon 13   She completed concurrent radiation and Xeloda for treatment of the rectal tumor 03/06/2013 through 04/11/2013.   Status post low anterior resection with a diverting loop ileostomy on 05/31/2013 at [facility] in [place]. Final pathology showed moderately differentiated adenocarcinoma located in the upper rectum; the tumor measured 2.5 x 2.2 cm; there was invasion through the muscularis propria into pericolic adipose tissue; tumor budding was present. Lymphatic status negative; vascular status negative; proximal and distal surgical margins negative; 2 of 17 lymph nodes showed metastatic adenocarcinoma. Mesenteric tumor deposits were present. Perineural invasion present. [facility] confirmed an additional positive lymph node and the tumor was staged as a T4a   CT abdomen/pelvis 05/12/2013. Metastatic nodules in the lung increased. Chronic loculated right base pneumothorax. Diffuse mucosal thickening of the sigmoid portion of the colon.   Restaging CTs 07/25/2013 with enlargement of bilateral lung nodules, no other evidence of metastatic disease   Ileostomy takedown 08/14/2013   Chest CT 10/02/2013 revealed enlargement of bilateral lung nodules and  increased right pleural fluid   Cycle 1 of FOLFIRI/Avastin 10/03/2013   Cycle 6 of FOLFIRI/Avastin 12/12/2013   Restaging CT 12/22/2013 with a decrease in the size of bilateral lung nodules, no evidence of disease progression   Cycle 7 FOLFIRI/Avastin 12/25/2013  Cycle 8 FOLFIRI/Avastin 01/10/2014  Cycle 9 FOLFIRI/Avastin 01/24/2014  Cycle 10 FOLFIRI/Avastin 02/07/2014  Cycle of 11 FOLFIRI/Avastin 02/21/2014  Restaging CT 03/05/2014 with a decrease in the size of 2 dominant right lung nodules, no new nodules, new pleural gas collection at the right apex  Cycle 12 FOLFIRI/Avastin 03/07/2014  Cycle 13 FOLFIRI/Avastin 03/20/2014  Cycle 14 FOLFIRI/Avastin 04/04/2014  Initiation of maintenance Xeloda/Avastin 04/18/2014  CT chest 07/09/2014 revealed slight enlargement of lung nodules  Treatment with single agent Avastin 07/10/2014  Resumption of FOLFIRI/Avastin on a 2 week schedule 07/25/2014  CT angio chest 09/14/2014 with essentially stable multifocal pulmonary metastatic disease.  5-fluorouracil dose reduced beginning 09/19/2014  FOLFIRI/Avastin switch to a 3 week schedule beginning with treatment 10/10/2014 secondary to malaise, diarrhea, and mucositis, irinotecan dose reduced  CT 12/26/2014 confirmed an increase in the size of bilateral lung nodules  FOLFOX/Avastin initiated on a 2 week schedule 01/02/2015  CT chest 03/20/2015 with slight enlargement of multiple lung nodules  Enrollment on an immunotherapy trial at [facility] March 2017-treatment began with Nivolumab, Ipilumumab, and cobimetinib 06/11/2015  Treatment placed on hold due to a skin rash 09/30/2015  Restaging CT scans 10/28/2015 with interval increase in the size of multiple bilateral pulmonary nodules and masses, increased size of right adrenal mass  Treatment resumed 10/29/2015  [facility] CT[facility] at [facility] 12/11/2015-stable lung nodules/masses, 2 new liver lesions suspicious for metastases, stable adrenal  mass   Taken off of the [facility] study secondary to disease progression  Cycle 1 Lonsurf 01/13/2016  Cycle 2 Lonsurf 02/17/2016  Cycle 3 Lonsurf 03/25/2016   CT chest 04/08/2016-enlargement of lung  metastases, a few new less than 1 cm metastases, new and enlarging liver lesions, enlarging right adrenal metastasis   2. Status post right VATS, drainage of pleural effusion, visceral and parietal pleural decortication 12/21/2012. Pleural fluid culture positive for viridans Streptococcus. Right pleura biopsies negative for malignancy. Findings favored empyema. 3. Diabetes. 4. Frequent bowel movements, diminished pelvic muscle tone-we made a referral to the pelvic physical therapy clinic 5. Pain in the left leg with tenderness at the left calf and left ischium-MRI of the lumbar spine and pelvis on 10/13/2011 with no evidence of metastatic disease. A right iliac bone "lesion" was felt to most likely represent heterogenous marrow. Edema and enhancement was noted at the left sciatic nerve compared to the right side with no mass lesion 6. Hypertension-on HCTZ 7. Left foot fracture 8. BRIP1 mutation 9. Mucositis most likely related to 5-fluorouracil. Dose reduction of 5 fluorouracil beginning 09/19/2014. 10. Erythematous rash-nivolumab,ipilumumab, or cobimetinib 11. 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 Admission 11/11/2015 with a cecal volvulus-spontaneously resolved 13. Zoster rash 03/03/2016-treated with Valtrex   Disposition:     Dr. [name] completed 3 cycles of salvage therapy with Lonsurf. The CEA is higher and the restaging CT reveals evidence of disease progression. The overall pattern is of clinical progression with the elevated CEA, progressive disease on CT, and weight loss. I recommended she discontinue Lonsurf.  I reviewed the CT images with her. Standard systemic treatment options are limited based on her treatment history. I do not recommend regular recommend.  I will check into clinical trial availability at [facility]. I will also look into treatment options in the setting of the BRIP1 mutation.  Dr. [name] will return for an office visit and further discussion in 3 weeks.  Approximately 30 minutes were spent with the patient today. The majority of the time was used for counseling and coordination of care.         Betsy Coder, MD  04/10/2016  2:33 PM

## 2016-04-10 NOTE — Telephone Encounter (Signed)
Appointments scheduled per 1/12 LOS. Patient given AVS report and calendars with future scheduled appointments.

## 2016-04-21 ENCOUNTER — Telehealth: Payer: Self-pay

## 2016-04-21 NOTE — Telephone Encounter (Signed)
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 She has some wheezing. She denies CP. Is calling to keep Dr Benay Spice informed. Called pt back: her SOB has been this week. With wheezing. She is not taking any respiratory meds or inhalers.  She has been off [place] since 1/14. Last OV 1/12. No fever, no cough, no congestion. This is more pronounced. More fatigue, energy is not coming back while off Lonsurf.  Next OV 2/2

## 2016-04-21 NOTE — Telephone Encounter (Signed)
Returned call to pt, offered office visit for 1/24 @ 0830. She stated she just wanted to update Dr. Benay Spice. Doesn't think she needs to be seen. She will call in the AM if she decides to come in.

## 2016-05-01 ENCOUNTER — Other Ambulatory Visit: Payer: Self-pay | Admitting: *Deleted

## 2016-05-01 ENCOUNTER — Ambulatory Visit (HOSPITAL_BASED_OUTPATIENT_CLINIC_OR_DEPARTMENT_OTHER): Payer: Medicare Other | Admitting: Oncology

## 2016-05-01 ENCOUNTER — Ambulatory Visit (HOSPITAL_BASED_OUTPATIENT_CLINIC_OR_DEPARTMENT_OTHER): Payer: Medicare Other

## 2016-05-01 VITALS — BP 124/64 | HR 79 | Temp 98.6°F | Resp 18 | Ht 64.0 in | Wt 114.0 lb

## 2016-05-01 DIAGNOSIS — N390 Urinary tract infection, site not specified: Secondary | ICD-10-CM | POA: Diagnosis present

## 2016-05-01 DIAGNOSIS — C7801 Secondary malignant neoplasm of right lung: Secondary | ICD-10-CM | POA: Diagnosis not present

## 2016-05-01 DIAGNOSIS — C7802 Secondary malignant neoplasm of left lung: Secondary | ICD-10-CM

## 2016-05-01 DIAGNOSIS — C20 Malignant neoplasm of rectum: Secondary | ICD-10-CM

## 2016-05-01 DIAGNOSIS — C7650 Malignant neoplasm of unspecified lower limb: Secondary | ICD-10-CM

## 2016-05-01 DIAGNOSIS — R8290 Unspecified abnormal findings in urine: Secondary | ICD-10-CM | POA: Insufficient documentation

## 2016-05-01 DIAGNOSIS — R0602 Shortness of breath: Secondary | ICD-10-CM | POA: Diagnosis not present

## 2016-05-01 DIAGNOSIS — Z23 Encounter for immunization: Secondary | ICD-10-CM

## 2016-05-01 DIAGNOSIS — R300 Dysuria: Secondary | ICD-10-CM

## 2016-05-01 DIAGNOSIS — C7800 Secondary malignant neoplasm of unspecified lung: Secondary | ICD-10-CM

## 2016-05-01 LAB — URINALYSIS, MICROSCOPIC - CHCC
BILIRUBIN (URINE): NEGATIVE
GLUCOSE UR CHCC: NEGATIVE mg/dL
Ketones: NEGATIVE mg/dL
NITRITE: NEGATIVE
PH: 7 (ref 4.6–8.0)
Protein: 100 mg/dL
Specific Gravity, Urine: 1.01 (ref 1.003–1.035)
Urobilinogen, UR: 0.2 mg/dL (ref 0.2–1)

## 2016-05-01 MED ORDER — CIPROFLOXACIN HCL 250 MG PO TABS: ORAL_TABLET | ORAL | 0 refills | Status: DC

## 2016-05-01 MED ORDER — OXYCODONE-ACETAMINOPHEN 10-325 MG PO TABS: 1.0000 | ORAL_TABLET | ORAL | 0 refills | Status: DC | PRN

## 2016-05-01 NOTE — Progress Notes (Signed)
[place] OFFICE PROGRESS NOTE   Diagnosis: Rectal cancer  INTERVAL HISTORY:   Anne [name] as scheduled. She is accompanied by her daughter. She has increased discomfort in the right lower anterior chest near the costal margin. She takes oxycodone approximately twice daily for relief of pain. She continues to eat out each morning. She developed burning with urination beginning yesterday. She had a transient episode of shortness of breath last week.  Objective:  Vital signs in last 24 hours:  Blood pressure 124/64, pulse 79, temperature 98.6 F (37 C), temperature source Oral, resp. rate 18, height '5\' 4"'$  (1.626 m), weight 114 lb (51.7 kg), SpO2 95 %.    HEENT: No thrush Resp: Decreased breath sounds at the right lower chest, no respiratory distress Cardio: Regular rate and rhythm GI: No hepatomegaly, nontender, no mass Vascular: No leg edema  Skin: Healed zoster rash at the right lower chest   Portacath/PICC-without erythema  Lab Results: Urinalysis: Leukocyte esterase-large, T numerous to count white cells, many bacteria  Medications: I have reviewed the patient's current medications.  Assessment/Plan: 1. Metastatic rectal cancer diagnosed July 2014. MSI stable on the March 2015 resection specimen. Foundation 1 Testing confirmed a KRAS G13 mutation, APC mutation, and BRIP1 mutation.  Colonoscopy on 10/25/2012 showed a bulky circumferential friable mass lesion in the rectum at approximately 4 cm above the dentate line. The mass was subtotally obstructing and would not permit passage of the colonoscope proximal. Biopsy showed adenocarcinoma.   Staging CT scans 10/25/2012 showed a right lower lobe lung mass measuring 5 x 4.6 cm; 0.8 cm nodule right upper lobe; masslike rectosigmoid wall thickening, surrounding stranding and adjacent lymphadenopathy.   PET scan 11/02/2012 showed hypermetabolism in the area of apparent circumferential rectal wall  thickening; the 5 cm right lower lobe pulmonary mass was hypermetabolic with SUV max equal 8. No other hypermetabolic lung lesions were evident. Small bilateral pulmonary parenchymal nodules were noted.   She completed 5 cycles of FOLFOX chemotherapy 11/22/2012 through 02/13/2013.   Status post right thoracotomy, bilobectomy (right lower and middle lobectomy) with en bloc resection of the diaphragm 02/27/2013. Pathology showed adenocarcinoma consistent with metastatic colorectal adenocarcinoma, 4.3 cm, negative margins, no lymph node involvement. Negative soft tissue biopsy of the diaphragm x2.Positive for K-ras mutation-codon 13   She completed concurrent radiation and Xeloda for treatment of the rectal tumor 03/06/2013 through 04/11/2013.   Status post low anterior resection with a diverting loop ileostomy on 05/31/2013 at [facility] in [place]. Final pathology showed moderately differentiated adenocarcinoma located in the upper rectum; the tumor measured 2.5 x 2.2 cm; there was invasion through the muscularis propria into pericolic adipose tissue; tumor budding was present. Lymphatic status negative; vascular status negative; proximal and distal surgical margins negative; 2 of 17 lymph nodes showed metastatic adenocarcinoma. Mesenteric tumor deposits were present. Perineural invasion present. [facility] confirmed an additional positive [facility] node and the tumor was staged as a T4a   CT abdomen/pelvis 05/12/2013. Metastatic nodules in the lung increased. Chronic loculated right base pneumothorax. Diffuse mucosal thickening of the sigmoid portion of the colon.   Restaging CTs 07/25/2013 with enlargement of bilateral lung nodules, no other evidence of metastatic disease   Ileostomy takedown 08/14/2013   Chest CT 10/02/2013 revealed enlargement of bilateral lung nodules and increased right pleural fluid   Cycle 1 of FOLFIRI/Avastin 10/03/2013   Cycle 6 of FOLFIRI/Avastin  12/12/2013   Restaging CT 12/22/2013 with a decrease in the size of  bilateral lung nodules, no evidence of disease progression   Cycle 7 FOLFIRI/Avastin 12/25/2013  Cycle 8 FOLFIRI/Avastin 01/10/2014  Cycle 9 FOLFIRI/Avastin 01/24/2014  Cycle 10 FOLFIRI/Avastin 02/07/2014  Cycle of 11 FOLFIRI/Avastin 02/21/2014  Restaging CT 03/05/2014 with a decrease in the size of 2 dominant right lung nodules, no new nodules, new pleural gas collection at the right apex  Cycle 12 FOLFIRI/Avastin 03/07/2014  Cycle 13 FOLFIRI/Avastin 03/20/2014  Cycle 14 FOLFIRI/Avastin 04/04/2014  Initiation of maintenance Xeloda/Avastin 04/18/2014  CT chest 07/09/2014 revealed slight enlargement of lung nodules  Treatment with single agent Avastin 07/10/2014  Resumption of FOLFIRI/Avastin on a 2 week schedule 07/25/2014  CT angio chest 09/14/2014 with essentially stable multifocal pulmonary metastatic disease.  5-fluorouracil dose reduced beginning 09/19/2014  FOLFIRI/Avastin switch to a 3 week schedule beginning with treatment 10/10/2014 secondary to malaise, diarrhea, and mucositis, irinotecan dose reduced  CT 12/26/2014 confirmed an increase in the size of bilateral lung nodules  FOLFOX/Avastin initiated on a 2 week schedule 01/02/2015  CT chest 03/20/2015 with slight enlargement of multiple lung nodules  Enrollment on an immunotherapy trial at [facility] March 2017-treatment began with Nivolumab, Ipilumumab, and cobimetinib 06/11/2015  Treatment placed on hold due to a skin rash 09/30/2015  Restaging CT scans 10/28/2015 with interval increase in the size of multiple bilateral pulmonary nodules and masses, increased size of right adrenal mass  Treatment resumed 10/29/2015  Restaging CTs at [facility] 12/11/2015-stable lung nodules/masses, 2 new liver lesions suspicious for metastases, stable adrenal mass   Taken off of the [facility] study secondary to disease progression  Cycle 1 Lonsurf  01/13/2016  Cycle 2 Lonsurf11/20/2017  Cycle 3 Lonsurf 03/25/2016   CT chest 04/08/2016-enlargement of lung metastases, a few new less than 1 cm metastases, new and enlarging liver lesions, enlarging right adrenal metastasis   2. Status post right VATS, drainage of pleural effusion, visceral and parietal pleural decortication 12/21/2012. Pleural fluid culture positive for viridans Streptococcus. Right pleura biopsies negative for malignancy. Findings favored empyema. 3. Diabetes. 4. Frequent bowel movements, diminished pelvic muscle tone-we made a referral to the pelvic physical therapy clinic 5. Pain in the left leg with tenderness at the left calf and left ischium-MRI of the lumbar spine and pelvis on 10/13/2011 with no evidence of metastatic disease. A right iliac bone "lesion" was felt to most likely represent heterogenous marrow. Edema and enhancement was noted at the left sciatic nerve compared to the right side with no mass lesion 6. Hypertension-on HCTZ 7. Left foot fracture 8. BRIP1 mutation 9. Mucositis most likely related to 5-fluorouracil. Dose reduction of 5 fluorouracil beginning 09/19/2014. 10. Erythematous rash-nivolumab,ipilumumab, or cobimetinib 11. 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 Admission 11/11/2015 with a cecal volvulus-spontaneously resolved 13. Zoster rash 03/03/2016-treated with Valtrex  Disposition:  Anne [name] remains off of specific therapy for rectal cancer. Standard treatment options are limited. I discussed options with Anne [name] and her daughter. I have communicated with Dr. Dennison Nancy. The [facility] research team is looking into eligibility for another clinical trial. 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 I am checking into the possibility of an off label PARP inhibitor.  She plans [place] to visit her sister over the next few weeks. She has a urinary tract infection today. She will complete a course of  ciprofloxacin. We submitted a urine culture.  Anne [name] will return for an office visit and Port-A-Cath flush in 3 weeks.  25 minutes were spent with the patient today.  The majority of the time was used for counseling and coordination of care.   Anne Coder, Anne [name]  05/01/2016  9:20 AM

## 2016-05-04 ENCOUNTER — Telehealth: Payer: Self-pay | Admitting: *Deleted

## 2016-05-04 LAB — URINE CULTURE

## 2016-05-04 NOTE — Telephone Encounter (Signed)
Message from pt's sister reporting she has had little relief of UTI symptoms. Requesting another antibiotic. She will take final dose of Cipro this evening. Returned call to pt's sister, informed her we will call with instructions when sensitivity result is back. She voiced understanding.

## 2016-05-05 ENCOUNTER — Telehealth: Payer: Self-pay | Admitting: *Deleted

## 2016-05-05 ENCOUNTER — Telehealth: Payer: Self-pay

## 2016-05-05 MED ORDER — SULFAMETHOXAZOLE-TRIMETHOPRIM 800-160 MG PO TABS: 1.0000 | ORAL_TABLET | Freq: Two times a day (BID) | ORAL | 0 refills | Status: DC

## 2016-05-05 NOTE — Telephone Encounter (Signed)
Call placed back to Anne [name] to inform her that Dr. Benay Spice would like for pt to come in for repeat u/a today.  Anne [name] states that she is unable to get to [facility] today.  Dr. Benay Spice notified and order received for Bactrim DS one tablet twice a day for 5 days.  Anne [name] notified of MD order and appreciative of call back.

## 2016-05-05 NOTE — Telephone Encounter (Signed)
Call received from pt wanting to discuss study at [facility] that patient was contacted about on Friday, 05/01/16 with Dr. Benay Spice.  Will notify Dr. Benay Spice of pt.'s request.

## 2016-05-05 NOTE — Telephone Encounter (Signed)
Anne [name] called stating we are waiting for urine culture to come back to Rx an antibiotic that will work on her UTI. The UTI has been for several weeks with no relief. The Cipro last week did not work. Pt is very uncomfortable.

## 2016-05-06 ENCOUNTER — Other Ambulatory Visit: Payer: Self-pay | Admitting: *Deleted

## 2016-05-06 ENCOUNTER — Telehealth: Payer: Self-pay | Admitting: *Deleted

## 2016-05-06 MED ORDER — EST ESTROGENS-METHYLTEST 1.25-2.5 MG PO TABS: 1.0000 | ORAL_TABLET | Freq: Every day | ORAL | 1 refills | Status: AC

## 2016-05-06 NOTE — Telephone Encounter (Signed)
Message received from patient's sister Romie Levee requesting refill of Estratest for patient.  Per Dr. Benay Spice, ok for refill.  Refill called in to [facility].

## 2016-05-08 ENCOUNTER — Encounter: Payer: Self-pay | Admitting: *Deleted

## 2016-05-08 NOTE — Progress Notes (Signed)
Copy of [facility] protocol brought in by patient and given to Dr. Benay Spice.

## 2016-05-13 ENCOUNTER — Telehealth: Payer: Self-pay | Admitting: *Deleted

## 2016-05-13 ENCOUNTER — Telehealth: Payer: Self-pay

## 2016-05-13 NOTE — Telephone Encounter (Signed)
Call received from patient to inform Dr. Benay Spice that she does not wish to participate in [place] Study at this time.  Dr. Benay Spice notified.  Patient states that she is planning to travel to [place] on Saturday for a week to visit her sister and would like a print out of her med list.  Med list printed and will be left at front desk of [facility] for pick up on 05/14/16.  Patient informed.

## 2016-05-13 NOTE — Telephone Encounter (Signed)
Patient brought in information about clinical trial at [facility] for Dr. Benay Spice to read over, Dr. Benay Spice reviewed- not familiar with that drug but thinks she should proceed with contacting [facility] for the trial. Left message for pt with this information. Requested a call back for confirmation of voicemail.

## 2016-05-27 ENCOUNTER — Other Ambulatory Visit: Payer: Self-pay | Admitting: *Deleted

## 2016-05-27 ENCOUNTER — Ambulatory Visit (HOSPITAL_BASED_OUTPATIENT_CLINIC_OR_DEPARTMENT_OTHER): Payer: Medicare Other | Admitting: Oncology

## 2016-05-27 VITALS — BP 132/62 | HR 80 | Temp 97.9°F | Resp 18 | Wt 115.1 lb

## 2016-05-27 DIAGNOSIS — Z23 Encounter for immunization: Secondary | ICD-10-CM

## 2016-05-27 DIAGNOSIS — C7801 Secondary malignant neoplasm of right lung: Secondary | ICD-10-CM | POA: Diagnosis not present

## 2016-05-27 DIAGNOSIS — E279 Disorder of adrenal gland, unspecified: Secondary | ICD-10-CM | POA: Diagnosis not present

## 2016-05-27 DIAGNOSIS — C20 Malignant neoplasm of rectum: Secondary | ICD-10-CM

## 2016-05-27 DIAGNOSIS — Z95828 Presence of other vascular implants and grafts: Secondary | ICD-10-CM

## 2016-05-27 DIAGNOSIS — I10 Essential (primary) hypertension: Secondary | ICD-10-CM

## 2016-05-27 DIAGNOSIS — R109 Unspecified abdominal pain: Secondary | ICD-10-CM

## 2016-05-27 DIAGNOSIS — C7802 Secondary malignant neoplasm of left lung: Secondary | ICD-10-CM | POA: Diagnosis not present

## 2016-05-27 DIAGNOSIS — C7650 Malignant neoplasm of unspecified lower limb: Secondary | ICD-10-CM

## 2016-05-27 DIAGNOSIS — E119 Type 2 diabetes mellitus without complications: Secondary | ICD-10-CM | POA: Diagnosis not present

## 2016-05-27 DIAGNOSIS — C7800 Secondary malignant neoplasm of unspecified lung: Secondary | ICD-10-CM

## 2016-05-27 MED ORDER — HEPARIN SOD (PORK) LOCK FLUSH 100 UNIT/ML IV SOLN
500.0000 [IU] | Freq: Once | INTRAVENOUS | Status: AC | PRN
  Administered 2016-05-27: 500 [IU] via INTRAVENOUS
  Filled 2016-05-27: qty 5

## 2016-05-27 MED ORDER — OXYCODONE-ACETAMINOPHEN 10-325 MG PO TABS: 1.0000 | ORAL_TABLET | ORAL | 0 refills | Status: DC | PRN

## 2016-05-27 MED ORDER — SODIUM CHLORIDE 0.9 % IJ SOLN
10.0000 mL | INTRAMUSCULAR | Status: DC | PRN
Start: 2016-05-27 — End: 2016-05-28
  Administered 2016-05-27: 10 mL via INTRAVENOUS
  Filled 2016-05-27: qty 10

## 2016-05-27 NOTE — Patient Instructions (Signed)
Implanted Port Home Guide An implanted port is a type of central line that is placed under the skin. Central lines are used to provide IV access when treatment or nutrition needs to be given through a person's veins. Implanted ports are used for long-term IV access. An implanted port may be placed because:  You need IV medicine that would be irritating to the small veins in your hands or arms.  You need long-term IV medicines, such as antibiotics.  You need IV nutrition for a long period.  You need frequent blood draws for lab tests.  You need dialysis.  Implanted ports are usually placed in the chest area, but they can also be placed in the upper arm, the abdomen, or the leg. An implanted port has two main parts:  Reservoir. The reservoir is round and will appear as a small, raised area under your skin. The reservoir is the part where a needle is inserted to give medicines or draw blood.  Catheter. The catheter is a thin, flexible tube that extends from the reservoir. The catheter is placed into a large vein. Medicine that is inserted into the reservoir goes into the catheter and then into the vein.  How will I care for my incision site? Do not get the incision site wet. Bathe or shower as directed by your health care provider. How is my port accessed? Special steps must be taken to access the port:  Before the port is accessed, a numbing cream can be placed on the skin. This helps numb the skin over the port site.  Your health care provider uses a sterile technique to access the port. ? Your health care provider must put on a mask and sterile gloves. ? The skin over your port is cleaned carefully with an antiseptic and allowed to dry. ? The port is gently pinched between sterile gloves, and a needle is inserted into the port.  Only "non-coring" port needles should be used to access the port. Once the port is accessed, a blood return should be checked. This helps ensure that the port  is in the vein and is not clogged.  If your port needs to remain accessed for a constant infusion, a clear (transparent) bandage will be placed over the needle site. The bandage and needle will need to be changed every week, or as directed by your health care provider.  Keep the bandage covering the needle clean and dry. Do not get it wet. Follow your health care provider's instructions on how to take a shower or bath while the port is accessed.  If your port does not need to stay accessed, no bandage is needed over the port.  What is flushing? Flushing helps keep the port from getting clogged. Follow your health care provider's instructions on how and when to flush the port. Ports are usually flushed with saline solution or a medicine called heparin. The need for flushing will depend on how the port is used.  If the port is used for intermittent medicines or blood draws, the port will need to be flushed: ? After medicines have been given. ? After blood has been drawn. ? As part of routine maintenance.  If a constant infusion is running, the port may not need to be flushed.  How long will my port stay implanted? The port can stay in for as long as your health care provider thinks it is needed. When it is time for the port to come out, surgery will be   done to remove it. The procedure is similar to the one performed when the port was put in. When should I seek immediate medical care? When you have an implanted port, you should seek immediate medical care if:  You notice a bad smell coming from the incision site.  You have swelling, redness, or drainage at the incision site.  You have more swelling or pain at the port site or the surrounding area.  You have a fever that is not controlled with medicine.  This information is not intended to replace advice given to you by your health care provider. Make sure you discuss any questions you have with your health care provider. Document  Released: 03/16/2005 Document Revised: 08/22/2015 Document Reviewed: 11/21/2012 Elsevier Interactive Patient Education  2017 Elsevier Inc.  

## 2016-05-27 NOTE — Progress Notes (Signed)
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 She took a trip to [place] to visit her sister. She had an episode of nausea/vomiting and diarrhea on the day she arrived. This has not recurred. She has intermittent abdominal pain. She has a poor appetite. She feels that her overall condition is stable.  Objective:  Vital signs in last 24 hours:  Blood pressure 132/62, pulse 80, temperature 97.9 F (36.6 C), temperature source Oral, resp. rate 18, weight 115 lb 1.6 oz (52.2 kg), SpO2 98 %. Resp: Decreased breath sounds at the right lower chest, no respiratory distress Cardio: Regular rate and rhythm GI: No hepatosplenomegaly, nontender, no mass Vascular: No leg edema Skin: Healed zoster rash at the right mid back   Portacath/PICC-without erythema   Medications: I have reviewed the patient's current medications.  Assessment/Plan: 1. Metastatic rectal cancer diagnosed July 2014. MSI stable on the March 2015 resection specimen. Foundation 1 Testing confirmed a KRAS G13 mutation, APC mutation, and BRIP1 mutation.  Colonoscopy on 10/25/2012 showed a bulky circumferential friable mass lesion in the rectum at approximately 4 cm above the dentate line. The mass was subtotally obstructing and would not permit passage of the colonoscope proximal. Biopsy showed adenocarcinoma.   Staging CT scans 10/25/2012 showed a right lower lobe lung mass measuring 5 x 4.6 cm; 0.8 cm nodule right upper lobe; masslike rectosigmoid wall thickening, surrounding stranding and adjacent lymphadenopathy.   PET scan 11/02/2012 showed hypermetabolism in the area of apparent circumferential rectal wall thickening; the 5 cm right lower lobe pulmonary mass was hypermetabolic with SUV max equal 8. No other hypermetabolic lung lesions were evident. Small bilateral pulmonary parenchymal nodules were noted.   She completed 5 cycles of  FOLFOX chemotherapy 11/22/2012 through 02/13/2013.   Status post right thoracotomy, bilobectomy (right lower and middle lobectomy) with en bloc resection of the diaphragm 02/27/2013. Pathology showed adenocarcinoma consistent with metastatic colorectal adenocarcinoma, 4.3 cm, negative margins, no lymph node involvement. Negative soft tissue biopsy of the diaphragm x2.Positive for K-ras mutation-codon 13   She completed concurrent radiation and Xeloda for treatment of the rectal tumor 03/06/2013 through 04/11/2013.   Status post low anterior resection with a diverting loop ileostomy on 05/31/2013 at [facility] in [place]. Final pathology showed moderately differentiated adenocarcinoma located in the upper rectum; the tumor measured 2.5 x 2.2 cm; there was invasion through the muscularis propria into pericolic adipose tissue; tumor budding was present. Lymphatic status negative; vascular status negative; proximal and distal surgical margins negative; 2 of 17 lymph nodes showed metastatic adenocarcinoma. Mesenteric tumor deposits were present. Perineural invasion present. [facility] confirmed an additional positive lymph node and the tumor was staged as a T4a   CT abdomen/pelvis 05/12/2013. Metastatic nodules in the lung increased. Chronic loculated right base pneumothorax. Diffuse mucosal thickening of the sigmoid portion of the colon.   Restaging CTs 07/25/2013 with enlargement of bilateral lung nodules, no other evidence of metastatic disease   Ileostomy takedown 08/14/2013   Chest CT 10/02/2013 revealed enlargement of bilateral lung nodules and increased right pleural fluid   Cycle 1 of FOLFIRI/Avastin 10/03/2013   Cycle 6 of FOLFIRI/Avastin 12/12/2013   Restaging CT 12/22/2013 with a decrease in the size of bilateral lung nodules, no evidence of disease progression   Cycle 7 FOLFIRI/Avastin 12/25/2013  Cycle 8 FOLFIRI/Avastin 01/10/2014  Cycle 9  FOLFIRI/Avastin 01/24/2014  Cycle 10 FOLFIRI/Avastin 02/07/2014  Cycle of 11  FOLFIRI/Avastin 02/21/2014  Restaging CT 03/05/2014 with a decrease in the size of 2 dominant right lung nodules, no new nodules, new pleural gas collection at the right apex  Cycle 12 FOLFIRI/Avastin 03/07/2014  Cycle 13 FOLFIRI/Avastin 03/20/2014  Cycle 14 FOLFIRI/Avastin 04/04/2014  Initiation of maintenance Xeloda/Avastin 04/18/2014  CT chest 07/09/2014 revealed slight enlargement of lung nodules  Treatment with single agent Avastin 07/10/2014  Resumption of FOLFIRI/Avastin on a 2 week schedule 07/25/2014  CT angio chest 09/14/2014 with essentially stable multifocal pulmonary metastatic disease.  5-fluorouracil dose reduced beginning 09/19/2014  FOLFIRI/Avastin switch to a 3 week schedule beginning with treatment 10/10/2014 secondary to malaise, diarrhea, and mucositis, irinotecan dose reduced  CT 12/26/2014 confirmed an increase in the size of bilateral lung nodules  FOLFOX/Avastin initiated on a 2 week schedule 01/02/2015  CT chest 03/20/2015 with slight enlargement of multiple lung nodules  Enrollment on an immunotherapy trial at [facility] March 2017-treatment began with Nivolumab, Ipilumumab, and cobimetinib 06/11/2015  Treatment placed on hold due to a skin rash 09/30/2015  Restaging CT scans 10/28/2015 with interval increase in the size of multiple bilateral pulmonary nodules and masses, increased size of right adrenal mass  Treatment resumed 10/29/2015  Restaging CTs at [facility] 12/11/2015-stable lung nodules/masses, 2 new liver lesions suspicious for metastases, stable adrenal mass   Taken off of the [facility] study secondary to disease progression  Cycle 1 Lonsurf 01/13/2016  Cycle 2 Lonsurf11/20/2017  Cycle 3Lonsurf 03/25/2016   CT chest 04/08/2016-enlargement of lung metastases, a few new less than 1 cm metastases, new and enlarging liver lesions, enlarging right adrenal metastasis    2. Status post right VATS, drainage of pleural effusion, visceral and parietal pleural decortication 12/21/2012. Pleural fluid culture positive for viridans Streptococcus. Right pleura biopsies negative for malignancy. Findings favored empyema. 3. Diabetes. 4. Frequent bowel movements, diminished pelvic muscle tone-we made a referral to the pelvic physical therapy clinic 5. Pain in the left leg with tenderness at the left calf and left ischium-MRI of the lumbar spine and pelvis on 10/13/2011 with no evidence of metastatic disease. A right iliac bone "lesion" was felt to most likely represent heterogenous marrow. Edema and enhancement was noted at the left sciatic nerve compared to the right side with no mass lesion 6. Hypertension-on HCTZ 7. Left foot fracture 8. BRIP1 mutation 9. Mucositis most likely related to 5-fluorouracil. Dose reduction of 5 fluorouracil beginning 09/19/2014. 10. Erythematous rash-nivolumab,ipilumumab, or cobimetinib 11. 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 Admission 11/11/2015 with a cecal volvulus-spontaneously resolved 13. 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 She appears to have slow progression of the metastatic tumor burden. We discussed treatment options at length. She was offered enrollment in a phase I study at [facility]. She does not wish to enroll in this clinical trial.  Standard treatment options are limited. We continue to check into availability of a PARP inhibitor with her BRIP1 mutation. We will contact the pharmaceutical company to see if she may be approved for compassionate use of a PARP inhibitor.  Dr. [name] will return for an office visit 06/11/2016.  25 minutes were spent with the patient today. The majority of the time was used for counseling and coordination of care.  Betsy Coder, MD  05/27/2016  8:44 AM

## 2016-06-11 ENCOUNTER — Ambulatory Visit (HOSPITAL_BASED_OUTPATIENT_CLINIC_OR_DEPARTMENT_OTHER): Payer: Medicare Other | Admitting: Oncology

## 2016-06-11 DIAGNOSIS — C20 Malignant neoplasm of rectum: Secondary | ICD-10-CM

## 2016-06-11 DIAGNOSIS — C7800 Secondary malignant neoplasm of unspecified lung: Secondary | ICD-10-CM

## 2016-06-11 DIAGNOSIS — Z23 Encounter for immunization: Secondary | ICD-10-CM

## 2016-06-11 DIAGNOSIS — R140 Abdominal distension (gaseous): Secondary | ICD-10-CM

## 2016-06-11 MED ORDER — OXYCODONE-ACETAMINOPHEN 10-325 MG PO TABS: 1.0000 | ORAL_TABLET | ORAL | 0 refills | Status: DC | PRN

## 2016-06-11 NOTE — Progress Notes (Signed)
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 She feels well. She has noted abdominal distention in the mornings. No nausea/vomiting. She is having bowel movements. She takes oxycodone 2-3 times per day for abdominal discomfort.   Objective:  Vital signs in last 24 hours:  Blood pressure 132/76, pulse (!) 102, temperature 98.4 F (36.9 C), temperature source Oral, resp. rate 18, weight 116 lb 6.4 oz (52.8 kg), SpO2 97 %.    Resp: Decreased breath sounds at the right lower chest, no respiratory distress Cardio: Regular rate and rhythm GI: No hepatomegaly, nontender, no apparent ascites, no mass Vascular: No leg edema  Skin: Healed zoster rash at the right posterior lateral chest   Portacath/PICC-without erythema  Medications: I have reviewed the patient's current medications.  Assessment/Plan: 1. Metastatic rectal cancer diagnosed July 2014. MSI stable on the March 2015 resection specimen. Foundation 1 Testing confirmed a KRAS G13 mutation, APC mutation, and BRIP1 mutation.  Colonoscopy on 10/25/2012 showed a bulky circumferential friable mass lesion in the rectum at approximately 4 cm above the dentate line. The mass was subtotally obstructing and would not permit passage of the colonoscope proximal. Biopsy showed adenocarcinoma.   Staging CT scans 10/25/2012 showed a right lower lobe lung mass measuring 5 x 4.6 cm; 0.8 cm nodule right upper lobe; masslike rectosigmoid wall thickening, surrounding stranding and adjacent lymphadenopathy.   PET scan 11/02/2012 showed hypermetabolism in the area of apparent circumferential rectal wall thickening; the 5 cm right lower lobe pulmonary mass was hypermetabolic with SUV max equal 8. No other hypermetabolic lung lesions were evident. Small bilateral pulmonary parenchymal nodules were noted.   She completed 5 cycles of FOLFOX chemotherapy 11/22/2012  through 02/13/2013.   Status post right thoracotomy, bilobectomy (right lower and middle lobectomy) with en bloc resection of the diaphragm 02/27/2013. Pathology showed adenocarcinoma consistent with metastatic colorectal adenocarcinoma, 4.3 cm, negative margins, no lymph node involvement. Negative soft tissue biopsy of the diaphragm x2.Positive for K-ras mutation-codon 13   She completed concurrent radiation and Xeloda for treatment of the rectal tumor 03/06/2013 through 04/11/2013.   Status post low anterior resection with a diverting loop ileostomy on 05/31/2013 at [facility] in [place]. Final pathology showed moderately differentiated adenocarcinoma located in the upper rectum; the tumor measured 2.5 x 2.2 cm; there was invasion through the muscularis propria into pericolic adipose tissue; tumor budding was present. Lymphatic status negative; vascular status negative; proximal and distal surgical margins negative; 2 of 17 lymph nodes showed metastatic adenocarcinoma. Mesenteric tumor deposits were present. Perineural invasion present. [facility] confirmed an additional positive lymph node and the tumor was staged as a T4a   CT abdomen/pelvis 05/12/2013. Metastatic nodules in the lung increased. Chronic loculated right base pneumothorax. Diffuse mucosal thickening of the sigmoid portion of the colon.   Restaging CTs 07/25/2013 with enlargement of bilateral lung nodules, no other evidence of metastatic disease   Ileostomy takedown 08/14/2013   Chest CT 10/02/2013 revealed enlargement of bilateral lung nodules and increased right pleural fluid   Cycle 1 of FOLFIRI/Avastin 10/03/2013   Cycle 6 of FOLFIRI/Avastin 12/12/2013   Restaging CT 12/22/2013 with a decrease in the size of bilateral lung nodules, no evidence of disease progression   Cycle 7 FOLFIRI/Avastin 12/25/2013  Cycle 8 FOLFIRI/Avastin 01/10/2014  Cycle 9 FOLFIRI/Avastin 01/24/2014  Cycle 10  FOLFIRI/Avastin 02/07/2014  Cycle of 11 FOLFIRI/Avastin 02/21/2014  Restaging CT 03/05/2014 with a  decrease in the size of 2 dominant right lung nodules, no new nodules, new pleural gas collection at the right apex  Cycle 12 FOLFIRI/Avastin 03/07/2014  Cycle 13 FOLFIRI/Avastin 03/20/2014  Cycle 14 FOLFIRI/Avastin 04/04/2014  Initiation of maintenance Xeloda/Avastin 04/18/2014  CT chest 07/09/2014 revealed slight enlargement of lung nodules  Treatment with single agent Avastin 07/10/2014  Resumption of FOLFIRI/Avastin on a 2 week schedule 07/25/2014  CT angio chest 09/14/2014 with essentially stable multifocal pulmonary metastatic disease.  5-fluorouracil dose reduced beginning 09/19/2014  FOLFIRI/Avastin switch to a 3 week schedule beginning with treatment 10/10/2014 secondary to malaise, diarrhea, and mucositis, irinotecan dose reduced  CT 12/26/2014 confirmed an increase in the size of bilateral lung nodules  FOLFOX/Avastin initiated on a 2 week schedule 01/02/2015  CT chest 03/20/2015 with slight enlargement of multiple lung nodules  Enrollment on an immunotherapy trial at [facility] March 2017-treatment began with Nivolumab, Ipilumumab, and cobimetinib 06/11/2015  Treatment placed on hold due to a skin rash 09/30/2015  Restaging CT scans 10/28/2015 with interval increase in the size of multiple bilateral pulmonary nodules and masses, increased size of right adrenal mass  Treatment resumed 10/29/2015  Restaging CTs at [facility] 12/11/2015-stable lung nodules/masses, 2 new liver lesions suspicious for metastases, stable adrenal mass   Taken off of the [facility] study secondary to disease progression  Cycle 1 Lonsurf 01/13/2016  Cycle 2 Lonsurf11/20/2017  Cycle 3Lonsurf 03/25/2016   CT chest 04/08/2016-enlargement of lung metastases, a few new less than 1 cm metastases, new and enlarging liver lesions, enlarging right adrenal metastasis   2. Status post right VATS, drainage of  pleural effusion, visceral and parietal pleural decortication 12/21/2012. Pleural fluid culture positive for viridans Streptococcus. Right pleura biopsies negative for malignancy. Findings favored empyema. 3. Diabetes. 4. Frequent bowel movements, diminished pelvic muscle tone-we made a referral to the pelvic physical therapy clinic 5. Pain in the left leg with tenderness at the left calf and left ischium-MRI of the lumbar spine and pelvis on 10/13/2011 with no evidence of metastatic disease. A right iliac bone "lesion" was felt to most likely represent heterogenous marrow. Edema and enhancement was noted at the left sciatic nerve compared to the right side with no mass lesion 6. Hypertension-on HCTZ 7. Left foot fracture 8. BRIP1 mutation 9. Mucositis most likely related to 5-fluorouracil. Dose reduction of 5 fluorouracil beginning 09/19/2014. 10. Erythematous rash-nivolumab,ipilumumab, or cobimetinib 11. 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 Admission 11/11/2015 with a cecal volvulus-spontaneously resolved 13. 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 She will contact us for nausea or increased abdominal distention. The distention may be related to adhesions or the cecal volvulus she had last year.  She understands standard treatment options are limited. We again discussed clinical trials. She does not wish to participate in the phase I study at [facility]. We have checked into availability of PARP inhibitors given her BRIP mutation. There is no published data in patients with colorectal cancer. We have contacted the pharmaceutical companies and look for clinical trials with PARP inhibitors. There is a phase II clinical trial at the [facility] with niraparib scheduled to begin accruing in May. We will check into the specifics of this study.  I will refer her to Dr. Sherlynn Stalls to check into clinical trial  availability they are.  Dr. [name] maintains a good performance status.  25 minutes were spent with the patient today. The majority of the time was used for counseling  and coordination of care.  Betsy Coder, MD  06/11/2016  4:35 PM

## 2016-06-12 ENCOUNTER — Telehealth: Payer: Self-pay | Admitting: Oncology

## 2016-06-12 NOTE — Telephone Encounter (Signed)
Spoke with patient regarding referral to Dr Lauree Chandler and informed her that she will receive a call with appointment information. Referral routed to Kim/HIM in basket message. Appointments also scheduled, per 06/11/16 los. Appointments confirmed with patient.

## 2016-06-15 ENCOUNTER — Other Ambulatory Visit: Payer: Self-pay | Admitting: Plastic Surgery

## 2016-06-23 ENCOUNTER — Telehealth: Payer: Self-pay | Admitting: *Deleted

## 2016-06-23 NOTE — Telephone Encounter (Signed)
Left message on voicemail for Phase 1 Clinical trial RN requesting study information.

## 2016-06-23 NOTE — Telephone Encounter (Signed)
Message from pt's sister reporting increased pain today. Returned call pt reports pain resolved without medication. She has decided to proceed with clinical trial at [facility]. Plan is to start on 4/2.

## 2016-06-29 ENCOUNTER — Telehealth: Payer: Self-pay | Admitting: *Deleted

## 2016-06-29 NOTE — Telephone Encounter (Signed)
Message received from Wallis Bamberg with [facility] to inform Dr. Benay Spice that research drug for pt is named [id number].  Will notify Dr. Benay Spice of Scott's call.

## 2016-07-01 ENCOUNTER — Other Ambulatory Visit: Payer: Self-pay | Admitting: *Deleted

## 2016-07-01 ENCOUNTER — Telehealth: Payer: Self-pay | Admitting: *Deleted

## 2016-07-01 ENCOUNTER — Ambulatory Visit (HOSPITAL_BASED_OUTPATIENT_CLINIC_OR_DEPARTMENT_OTHER): Payer: Medicare Other | Admitting: Nurse Practitioner

## 2016-07-01 ENCOUNTER — Ambulatory Visit (HOSPITAL_COMMUNITY)
Admission: RE | Admit: 2016-07-01 | Discharge: 2016-07-01 | Disposition: A | Discharge status: Home / Self Care | Payer: Medicare Other | Source: Ambulatory Visit | Attending: Oncology | Admitting: Oncology

## 2016-07-01 ENCOUNTER — Ambulatory Visit (HOSPITAL_BASED_OUTPATIENT_CLINIC_OR_DEPARTMENT_OTHER): Payer: Medicare Other

## 2016-07-01 VITALS — BP 132/74 | HR 91 | Temp 98.2°F | Resp 18 | Ht 64.0 in

## 2016-07-01 DIAGNOSIS — C7800 Secondary malignant neoplasm of unspecified lung: Secondary | ICD-10-CM

## 2016-07-01 DIAGNOSIS — R109 Unspecified abdominal pain: Secondary | ICD-10-CM | POA: Insufficient documentation

## 2016-07-01 DIAGNOSIS — R1110 Vomiting, unspecified: Secondary | ICD-10-CM | POA: Diagnosis present

## 2016-07-01 DIAGNOSIS — C20 Malignant neoplasm of rectum: Secondary | ICD-10-CM

## 2016-07-01 DIAGNOSIS — Z23 Encounter for immunization: Secondary | ICD-10-CM

## 2016-07-01 DIAGNOSIS — K5900 Constipation, unspecified: Secondary | ICD-10-CM | POA: Diagnosis present

## 2016-07-01 DIAGNOSIS — R143 Flatulence: Secondary | ICD-10-CM | POA: Diagnosis not present

## 2016-07-01 DIAGNOSIS — Z95828 Presence of other vascular implants and grafts: Secondary | ICD-10-CM

## 2016-07-01 DIAGNOSIS — K5641 Fecal impaction: Secondary | ICD-10-CM | POA: Insufficient documentation

## 2016-07-01 LAB — COMPREHENSIVE METABOLIC PANEL
ALBUMIN: 3.5 g/dL (ref 3.5–5.0)
ALK PHOS: 105 U/L (ref 40–150)
ALT: 9 U/L (ref 0–55)
ANION GAP: 12 meq/L — AB (ref 3–11)
AST: 16 U/L (ref 5–34)
BILIRUBIN TOTAL: 0.34 mg/dL (ref 0.20–1.20)
BUN: 18.2 mg/dL (ref 7.0–26.0)
CO2: 30 mEq/L — ABNORMAL HIGH (ref 22–29)
Calcium: 10.6 mg/dL — ABNORMAL HIGH (ref 8.4–10.4)
Chloride: 97 mEq/L — ABNORMAL LOW (ref 98–109)
Creatinine: 0.6 mg/dL (ref 0.6–1.1)
EGFR: 89 mL/min/{1.73_m2} — AB (ref >90)
GLUCOSE: 187 mg/dL — AB (ref 70–140)
Potassium: 3.8 mEq/L (ref 3.5–5.1)
SODIUM: 139 meq/L (ref 136–145)
TOTAL PROTEIN: 7.6 g/dL (ref 6.4–8.3)

## 2016-07-01 LAB — CBC WITH DIFFERENTIAL/PLATELET
BASO%: 0.4 % (ref 0.0–2.0)
BASOS ABS: 0 10*3/uL (ref 0.0–0.1)
EOS ABS: 0.1 10*3/uL (ref 0.0–0.5)
EOS%: 0.7 % (ref 0.0–7.0)
HEMATOCRIT: 35.2 % (ref 34.8–46.6)
HEMOGLOBIN: 11.6 g/dL (ref 11.6–15.9)
LYMPH%: 11 % — ABNORMAL LOW (ref 14.0–49.7)
MCH: 27.6 pg (ref 25.1–34.0)
MCHC: 33 g/dL (ref 31.5–36.0)
MCV: 83.8 fL (ref 79.5–101.0)
MONO#: 0.9 10*3/uL (ref 0.1–0.9)
MONO%: 9.1 % (ref 0.0–14.0)
NEUT#: 7.4 10*3/uL — ABNORMAL HIGH (ref 1.5–6.5)
NEUT%: 78.8 % — ABNORMAL HIGH (ref 38.4–76.8)
NRBC: 0 % (ref 0–0)
Platelets: 553 10*3/uL — ABNORMAL HIGH (ref 145–400)
RBC: 4.2 10*6/uL (ref 3.70–5.45)
RDW: 14.7 % — AB (ref 11.2–14.5)
WBC: 9.4 10*3/uL (ref 3.9–10.3)
lymph#: 1 10*3/uL (ref 0.9–3.3)

## 2016-07-01 MED ORDER — HEPARIN SOD (PORK) LOCK FLUSH 100 UNIT/ML IV SOLN
500.0000 [IU] | Freq: Once | INTRAVENOUS | Status: AC | PRN
  Administered 2016-07-01: 500 [IU] via INTRAVENOUS
  Filled 2016-07-01: qty 5

## 2016-07-01 MED ORDER — SORBITOL 70 % PO SOLN: 15.0000 mL | ORAL | 0 refills | Status: AC | PRN

## 2016-07-01 MED ORDER — OXYCODONE-ACETAMINOPHEN 10-325 MG PO TABS: 1.0000 | ORAL_TABLET | ORAL | 0 refills | Status: AC | PRN

## 2016-07-01 MED ORDER — SODIUM CHLORIDE 0.9 % IJ SOLN
10.0000 mL | INTRAMUSCULAR | Status: DC | PRN
  Administered 2016-07-01: 10 mL via INTRAVENOUS
  Filled 2016-07-01: qty 10

## 2016-07-01 NOTE — Telephone Encounter (Signed)
Call received from patient stating that she is having severe lower abdominal cramping with tightness to abdomen and had large episode of vomiting last night.  She states that she is having no nausea at this time, but also has not eaten anything yet today.  She states that she is scheduled for CT and MRI at [facility] this Friday for clinical trial.  Dr. Benay Spice notified of patient's condition and would like for patient to come in now to be seen in [facility].  Patient informed to go to radiology prior to arrival to [facility] for abdominal xray.  Message sent to scheduling and [facility] clinic notified of patient's condition.

## 2016-07-01 NOTE — Patient Instructions (Signed)

## 2016-07-02 ENCOUNTER — Encounter: Payer: Self-pay | Admitting: Nurse Practitioner

## 2016-07-02 ENCOUNTER — Telehealth: Payer: Self-pay | Admitting: Nurse Practitioner

## 2016-07-02 ENCOUNTER — Emergency Department (HOSPITAL_COMMUNITY)
Admission: EM | Admit: 2016-07-02 | Discharge: 2016-07-03 | Disposition: A | Discharge status: Home / Self Care | Payer: Medicare Other | Attending: Emergency Medicine | Admitting: Emergency Medicine

## 2016-07-02 ENCOUNTER — Encounter (HOSPITAL_COMMUNITY): Payer: Self-pay | Admitting: Emergency Medicine

## 2016-07-02 DIAGNOSIS — K5900 Constipation, unspecified: Secondary | ICD-10-CM | POA: Diagnosis not present

## 2016-07-02 DIAGNOSIS — I10 Essential (primary) hypertension: Secondary | ICD-10-CM | POA: Insufficient documentation

## 2016-07-02 DIAGNOSIS — Z7984 Long term (current) use of oral hypoglycemic drugs: Secondary | ICD-10-CM | POA: Insufficient documentation

## 2016-07-02 DIAGNOSIS — E119 Type 2 diabetes mellitus without complications: Secondary | ICD-10-CM | POA: Insufficient documentation

## 2016-07-02 DIAGNOSIS — Z79899 Other long term (current) drug therapy: Secondary | ICD-10-CM | POA: Diagnosis not present

## 2016-07-02 DIAGNOSIS — R109 Unspecified abdominal pain: Secondary | ICD-10-CM | POA: Diagnosis present

## 2016-07-02 DIAGNOSIS — Z85038 Personal history of other malignant neoplasm of large intestine: Secondary | ICD-10-CM | POA: Diagnosis not present

## 2016-07-02 DIAGNOSIS — Z85828 Personal history of other malignant neoplasm of skin: Secondary | ICD-10-CM | POA: Diagnosis not present

## 2016-07-02 DIAGNOSIS — N3000 Acute cystitis without hematuria: Secondary | ICD-10-CM | POA: Insufficient documentation

## 2016-07-02 NOTE — ED Triage Notes (Addendum)
Pt saw oncologist yesterday for constipation and abdominal pain. Pt had x-ray of abdomen showing no obstruction, just large amount of stool in colon. Pt also had CMP and CBC with diff done. Pt was sent home with sorbitol. Pt has taken a total of '40mg'$  today with no BM. Pt has not undergone any chemo or radiation for at least 6 weeks. Abdomen is rigid with hyperactive bowel sounds.

## 2016-07-02 NOTE — Progress Notes (Signed)
SYMPTOM MANAGEMENT CLINIC    Chief Complaint: Constipation  HPI:  Anne [name] 71 y.o. female diagnosed with rectal cancer with lung metastasis.  Currently undergoing observation only.  Is awaiting a possible clinical trial at [facility].    No history exists.    Review of Systems  Constitutional: Positive for malaise/fatigue and weight loss.  Gastrointestinal: Positive for abdominal pain and constipation.  All other systems reviewed and are negative.   Past Medical History:  Diagnosis Date  . Asthma    as a child none since 89 years old  . Colon cancer ([place])    rectal cancer with lung metastasis  . Complication of anesthesia    pt hadpanic attacks with versed, no versed  . Diabetes mellitus without complication ([place])   . Early cataract    [place]   . Family history of malignant neoplasm of breast   . Family history of malignant neoplasm of ovary   . Fracture of left foot    2015 trip  . 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 Headache(784.0)   . Heart murmur, systolic 2/0/9470   innocent per Dr. Gershon Mussel   . High frequency hearing loss    [facility] followed   . History of renal stone 6 12   d[place]stedt  . Hx of colonic polyps   . Hx of compression fracture of spine 1985   t12 after a  20 foot fall  . Hx of fracture of wrist    after fall   . Hx of hyperglycemia 07/01/2011  . Postmenopausal HRT (hormone replacement therapy) 07/01/2011   surgical menopause  . Skin cancer    multiple  . Status post chemotherapy    completed a total of three cycles of Folfox     Past Surgical History:  Procedure Laterality Date  . ADENOIDECTOMY  1952  . [place]   right [place]  . COLONOSCOPY N/A 10/24/2012   Procedure: COLONOSCOPY;  Surgeon: Irene Shipper, MD;  Location: [facility] ENDOSCOPY;  Service: Endoscopy;   Laterality: N/A;  . COLONOSCOPY N/A 10/25/2012   Procedure: COLONOSCOPY;  Surgeon: Irene Shipper, MD;  Location: [place];  Service: Endoscopy;  Laterality: N/A;  . compression fraction  1985   T12  . COSMETIC SURGERY  1999-2007   holderness blepharoplasty facial rhytidectomy mastopexy abdominoplasty   . DECORTICATION Right 12/21/2012   Procedure: DECORTICATION;  Surgeon: Melrose Nakayama, MD;  Location: [place];  Service: Thoracic;  Laterality: Right;  . dental implant    . DILATION AND CURETTAGE OF UTERUS  1977  . PLEURAL EFFUSION DRAINAGE Right 12/21/2012   Procedure: DRAINAGE OF PLEURAL EFFUSION;  Surgeon: Melrose Nakayama, MD;  Location: [place];  Service: Thoracic;  Laterality: Right;  . PORTACATH PLACEMENT Left 11/21/2012   Procedure: INSERTION PORT-A-CATH;  Surgeon: Melrose Nakayama, MD;  Location: [place];  Service: Thoracic;  Laterality: Left;  . THOROCOTOMY WITH LOBECTOMY Right 02/27/2013   Procedure: THOROCOTOMY WITH BI- LOBECTOMY;  Surgeon: Melrose Nakayama, MD;  Location: [place];  Service: Thoracic;  Laterality: Right;  . TONSILLECTOMY  1952  . TOTAL ABDOMINAL HYSTERECTOMY  2005   with cystocele repair   . VIDEO ASSISTED THORACOSCOPY Right 12/21/2012   Procedure: VIDEO ASSISTED THORACOSCOPY;  Surgeon: Melrose Nakayama, MD;  Location: [place];  Service: Thoracic;  Laterality: Right;  Marland Kitchen VIDEO BRONCHOSCOPY Bilateral 10/27/2012   Procedure: VIDEO BRONCHOSCOPY WITH FLUORO;  Surgeon: Chesley Mires, MD;  Location: [facility] ENDOSCOPY;  Service: Endoscopy;  Laterality: Bilateral;  . 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    is allergic to ativan [lorazepam]; midazolam; zolpidem tartrate; bacitracin; moviprep [peg-kcl-nacl-nasulf-na asc-c]; and neomycin-bacitracin zn-polymyx.  Allergies as of 07/01/2016      Reactions   Ativan [lorazepam] Anxiety, Other (See Comments)   Reaction:  Panic attacks    Midazolam Anxiety, Other (See Comments)   Reaction:  Panic attacks    Zolpidem Tartrate Anxiety, Other (See Comments)   Reaction:  Panic attacks    Bacitracin Itching, Rash   Moviprep [peg-kcl-nacl-nasulf-na Asc-c] Other (See Comments)   Reaction:  Bloating/GI upset    Neomycin-bacitracin Zn-polymyx Itching, Rash      Medication List       Accurate as of 07/01/16 11:59 PM. Always use your most recent med list.          amLODipine 10 MG tablet Commonly known as:  NORVASC TAKE ONE TABLET EVERY DAY   diphenhydrAMINE 25 mg capsule Commonly known as:  BENADRYL Take 50 mg by mouth every 6 (six) hours as needed for itching, allergies or sleep.   estradiol 0.0375 MG/24HR Commonly known as:  [place] 1 patch onto the skin 2 (two) times a week.   estrogens-methylTEST 1.25-2.5 MG tablet Commonly known as:  ESTRATEST Take 1 tablet by mouth daily. Methyltestosterone (Compounded medication)   hydrochlorothiazide 12.5 MG capsule Commonly known as:  MICROZIDE TAKE ONE CAPSULE EACH DAY   hydrocortisone 2.5 % cream Apply 1  application topically 2 (two) times daily as needed (for itching).   JANUVIA 100 MG tablet Generic drug:  sitaGLIPtin TAKE ONE TABLET EACH DAY   loperamide 2 MG capsule Commonly known as:  IMODIUM Take 2 mg by mouth as needed for diarrhea or loose stools.   metFORMIN 1000 MG tablet Commonly known as:  GLUCOPHAGE TAKE ONE TABLET TWICE DAILY WITH A MEAL   multivitamin with minerals Tabs tablet Take 1 tablet by mouth daily.   ondansetron 8 MG tablet Commonly known as:  ZOFRAN Take 1 tablet (8 mg total) by mouth every 8 (eight) hours as needed for nausea or vomiting.   oxyCODONE-acetaminophen 10-325 MG tablet Commonly known as:  PERCOCET Take 1 tablet by mouth every 4 (four) hours as needed for pain.   sorbitol 70 % solution Take 15 mLs by mouth every 2 (two) hours as needed.        PHYSICAL EXAMINATION  Oncology Vitals 07/01/2016 06/11/2016  Height 163 cm -  Weight - 52.799 kg  Weight (lbs) - 116 lbs 6 oz  BMI (kg/m2) - 19.98 kg/m2  Temp 98.2 98.4  Pulse 91 102  Resp 18 18  SpO2 97 97  BSA (m2) - 1.54 m2   BP Readings from Last 2 Encounters:  07/01/16 132/74  06/11/16 132/76    Physical Exam  Constitutional: Anne [name] is oriented to person, place, and time. Vital signs are normal. Anne [name] appears malnourished. Anne [name] appears unhealthy. Anne [name] appears cachectic.  Patient's last weight that was documented was 110 pounds.  Patient refused to weigh herself today at the cancer.  The patient appears to have lost a great deal of weight since her last exam.  HENT:  Head: Normocephalic and atraumatic.  Mouth/Throat: Oropharynx is clear and moist.  Eyes: Conjunctivae and EOM are normal. Pupils are equal, round, and reactive to light. Right eye exhibits no discharge. Left eye exhibits no discharge. No scleral icterus.  Neck: Normal range of motion. Neck supple. No JVD present. No tracheal deviation present. No thyromegaly present.  Cardiovascular: Normal rate, regular rhythm, normal heart  sounds and intact distal pulses.   Pulmonary/Chest: Effort normal and breath sounds normal. No respiratory distress. Anne [name] has no wheezes. Anne [name] has no rales. Anne [name] exhibits no tenderness.  Abdominal: Soft. Anne [name] exhibits no distension and no mass. There is no tenderness. There is no rebound and no guarding.  Slightly hypoactive bowel sounds.  Musculoskeletal: Normal range of motion. Anne [name] exhibits no edema or tenderness.  Lymphadenopathy:    Anne [name] has no cervical adenopathy.  Neurological: Anne [name] is alert and oriented to person, place, and time. Gait normal.  Skin: Skin is warm and dry. No rash noted. No erythema. No pallor.  Psychiatric: Affect normal.  Nursing note and vitals reviewed.   LABORATORY DATA:. Appointment on 07/01/2016  Component Date Value Ref Range Status  . WBC 07/01/2016 9.4  3.9 - 10.3 10e3/uL Final  . NEUT# 07/01/2016 7.4* 1.5 - 6.5 10e3/uL Final  . HGB 07/01/2016 11.6  11.6 - 15.9 g/dL Final  . HCT 07/01/2016 35.2  34.8 - 46.6 % Final  . Platelets 07/01/2016 553* 145 - 400 10e3/uL Final  . MCV 07/01/2016 83.8  79.5 - 101.0 fL Final  . MCH 07/01/2016 27.6  25.1 - 34.0 pg Final  . MCHC 07/01/2016 33.0  31.5 - 36.0 g/dL Final  . RBC 07/01/2016 4.20  3.70 - 5.45 10e6/uL Final  . RDW 07/01/2016 14.7* 11.2 - 14.5 % Final  . lymph# 07/01/2016 1.0  0.9 - 3.3 10e3/uL Final  . MONO# 07/01/2016 0.9  0.1 - 0.9 10e3/uL Final  . Eosinophils Absolute 07/01/2016 0.1  0.0 - 0.5 10e3/uL Final  . Basophils Absolute 07/01/2016 0.0  0.0 - 0.1 10e3/uL Final  . NEUT% 07/01/2016 78.8* 38.4 - 76.8 % Final  . LYMPH% 07/01/2016 11.0* 14.0 - 49.7 % Final  . MONO% 07/01/2016 9.1  0.0 - 14.0 % Final  . EOS% 07/01/2016 0.7  0.0 - 7.0 % Final  . BASO% 07/01/2016 0.4  0.0 - 2.0 % Final  . nRBC 07/01/2016 0  0 - 0 % Final  . Sodium 07/01/2016 139  136 - 145 mEq/L Final  . Potassium 07/01/2016 3.8  3.5 - 5.1 mEq/L Final  . Chloride 07/01/2016 97* 98 - 109 mEq/L Final  . CO2 07/01/2016 30* 22 - 29 mEq/L  Final  . Glucose 07/01/2016 187* 70 - 140 mg/dl Final  . BUN 07/01/2016 18.2  7.0 - 26.0 mg/dL Final  . Creatinine 07/01/2016 0.6  0.6 - 1.1 mg/dL Final  . Total Bilirubin 07/01/2016  0.34  0.20 - 1.20 mg/dL Final  . Alkaline Phosphatase 07/01/2016 105  40 - 150 U/L Final  . AST 07/01/2016 16  5 - 34 U/L Final  . ALT 07/01/2016 9  0 - 55 U/L Final  . Total Protein 07/01/2016 7.6  6.4 - 8.3 g/dL Final  . Albumin 87/18/4108 3.5  3.5 - 5.0 g/dL Final  . Calcium 57/90/7931 10.6* 8.4 - 10.4 mg/dL Final  . Anion Gap 12/11/5600 12* 3 - 11 mEq/L Final  . EGFR 07/01/2016 89* >90 ml/min/1.73 m2 Final    RADIOGRAPHIC STUDIES: Dg Abd 1 View  Result Date: 07/01/2016 CLINICAL DATA:  Abdominal cramping, pain and vomiting EXAM: ABDOMEN - 1 VIEW COMPARISON:  11/12/2015 FINDINGS: Single view abdomen demonstrates a nonobstructed bowel gas pattern with large amount of stool throughout the colon. Postsurgical changes in the low pelvis. Vascular calcifications. Compression deformities at T12 and L1. IMPRESSION: Nonobstructed gas pattern with large amount of stool in the colon. Electronically Signed   By: Jasmine Pang M.D.   On: 07/01/2016 14:00    ASSESSMENT/PLAN:    Rectal cancer metastasized to lung Patient has been undergoing observation only recently.  Anne [name] was scheduled to undergo scans at [facility] this coming Friday, 07/03/2016 in preparation for a possible clinical trial; the patient states that Anne [name] is going to cancel the appointments at [facility] for this coming Friday.  Anne [name] is very happy to report that her son is getting married next weekend in [place], [place]; and Anne [name] will reschedule all regarding the clinical trial at [facility] after the wedding.  Patient is scheduled to return here to the cancer Center for labs, flush, and a follow-up visit on 07/08/2016.  Constipation Patient states Anne [name] suffers with chronic constipation issues.  Anne [name] states Anne [name] had what Anne [name] thought was a fairly normal  bowel movement just yesterday; but presented to the cancer Center today with complaint of diffuse abdominal discomfort and some cramping.  Anne [name] denies any nausea; but does admit to vomiting 1 time within the past week or so.  Anne [name] denies any diarrhea.  Anne [name] denies any recent fevers or chills.  Anne [name] confirmed that Anne [name] does take pain medication once or twice per day on a fairly regular basis.  Anne [name] has not been taking any laxatives or stool softeners recently.  Of note-patient has a history of bowel obstruction in the past.  Exam today revealed bowel sounds slightly hypoactive; but still positive.  Abdomen was soft and nontender with palpation.  Feels no flank pain.  KUB obtained today revealed:  FINDINGS: Single view abdomen demonstrates a nonobstructed bowel gas pattern with large amount of stool throughout the colon. Postsurgical changes in the low pelvis. Vascular calcifications. Compression deformities at T12 and L1.  IMPRESSION: Nonobstructed gas pattern with large amount of stool in the colon.   Long discussion with both patient and her family member regarding the need to clear the constipation.  Patient was given a prescription for sorbitol to take as directed and to the constipation clears.  Then-patient will need to be on a bowel regimen to prevent any further issues with constipation.  Patient was given both verbal and written instructions regarding the sorbitol and the senna-S.  Also, patient was strongly encouraged to call/return or go directly to the emergency department for any worsening symptoms whatsoever.   Patient stated understanding of all instructions; and was in agreement with this plan of care. The patient knows to call the clinic with any problems, questions or  concerns.   Total time spent with patient was 25 minutes;  with greater than 75 percent of that time spent in face to face counseling regarding patient's symptoms,  and coordination of care and follow  up.  Disclaimer:This dictation was prepared with Dragon/digital dictation along with [employer]. Any transcriptional errors that result from this process are unintentional.  Drue Second, NP 07/02/2016

## 2016-07-02 NOTE — Assessment & Plan Note (Signed)
Patient has been undergoing observation only recently.  She was scheduled to undergo scans at [facility] this coming Friday, 07/03/2016 in preparation for a possible clinical trial; the patient states that she is going to cancel the appointments at [facility] for this coming Friday.  She is very happy to report that her son is getting married next weekend in [place], [place]; and she will reschedule all regarding the clinical trial at [facility] after the wedding.  Patient is scheduled to return here to the [place] for labs, flush, and a follow-up visit on 07/08/2016.

## 2016-07-02 NOTE — Assessment & Plan Note (Signed)
Patient states she suffers with chronic constipation issues.  She states she had what she thought was a fairly normal bowel movement just yesterday; but presented to the [facility] today with complaint of diffuse abdominal discomfort and some cramping.  She denies any nausea; but does admit to vomiting 1 time within the past week or so.  She denies any diarrhea.  She denies any recent fevers or chills.  She confirmed that she does take pain medication once or twice per day on a fairly regular basis.  She has not been taking any laxatives or stool softeners recently.  Of note-patient has a history of bowel obstruction in the past.  Exam today revealed bowel sounds slightly hypoactive; but still positive.  Abdomen was soft and nontender with palpation.  Feels no flank pain.  KUB obtained today revealed:  FINDINGS: Single view abdomen demonstrates a nonobstructed bowel gas pattern with large amount of stool throughout the colon. Postsurgical changes in the low pelvis. Vascular calcifications. Compression deformities at T12 and L1.  IMPRESSION: Nonobstructed gas pattern with large amount of stool in the colon.   Long discussion with both patient and her family member regarding the need to clear the constipation.  Patient was given a prescription for sorbitol to take as directed and to the constipation clears.  Then-patient will need to be on a bowel regimen to prevent any further issues with constipation.  Patient was given both verbal and written instructions regarding the sorbitol and the senna-S.  Also, patient was strongly encouraged to call/return or go directly to the emergency department for any worsening symptoms whatsoever.

## 2016-07-02 NOTE — Telephone Encounter (Signed)
Called patient to check on her this morning.  She states that she continues with the generalized abdominal discomfort from the constipation.  She states that she's had no further bowel movements.  Of note-patient states that she has not received the sorbitol as a prescription.  You.  She is awaiting her pharmacy to deliver it and it should be at her home within the next half hour or so.  Strongly encourage the patient and her family members to make sure she obtains the sorbitol and takes it as directed as soon as possible today.  Also, patient was advised to call/return or go directly to the emergency department for any worsening symptoms whatsoever.

## 2016-07-03 ENCOUNTER — Emergency Department (HOSPITAL_COMMUNITY): Payer: Medicare Other

## 2016-07-03 ENCOUNTER — Telehealth: Payer: Self-pay | Admitting: *Deleted

## 2016-07-03 ENCOUNTER — Encounter (HOSPITAL_COMMUNITY): Payer: Self-pay

## 2016-07-03 DIAGNOSIS — N3000 Acute cystitis without hematuria: Secondary | ICD-10-CM | POA: Diagnosis not present

## 2016-07-03 LAB — CBC WITH DIFFERENTIAL/PLATELET
BASOS PCT: 0 %
Basophils Absolute: 0 10*3/uL (ref 0.0–0.1)
EOS PCT: 1 %
Eosinophils Absolute: 0.1 10*3/uL (ref 0.0–0.7)
HEMATOCRIT: 35.5 % — AB (ref 36.0–46.0)
Hemoglobin: 11.6 g/dL — ABNORMAL LOW (ref 12.0–15.0)
Lymphocytes Relative: 7 %
Lymphs Abs: 0.8 10*3/uL (ref 0.7–4.0)
MCH: 27.7 pg (ref 26.0–34.0)
MCHC: 32.7 g/dL (ref 30.0–36.0)
MCV: 84.7 fL (ref 78.0–100.0)
MONO ABS: 0.9 10*3/uL (ref 0.1–1.0)
MONOS PCT: 7 %
NEUTROS ABS: 10.9 10*3/uL — AB (ref 1.7–7.7)
Neutrophils Relative %: 85 %
PLATELETS: 607 10*3/uL — AB (ref 150–400)
RBC: 4.19 MIL/uL (ref 3.87–5.11)
RDW: 14.7 % (ref 11.5–15.5)
WBC: 12.7 10*3/uL — ABNORMAL HIGH (ref 4.0–10.5)

## 2016-07-03 LAB — COMPREHENSIVE METABOLIC PANEL
ALBUMIN: 3.9 g/dL (ref 3.5–5.0)
ALT: 11 U/L — AB (ref 14–54)
ANION GAP: 11 (ref 5–15)
AST: 20 U/L (ref 15–41)
Alkaline Phosphatase: 96 U/L (ref 38–126)
BILIRUBIN TOTAL: 0.7 mg/dL (ref 0.3–1.2)
BUN: 22 mg/dL — AB (ref 6–20)
CALCIUM: 10.2 mg/dL (ref 8.9–10.3)
CO2: 28 mmol/L (ref 22–32)
CREATININE: 0.58 mg/dL (ref 0.44–1.00)
Chloride: 98 mmol/L — ABNORMAL LOW (ref 101–111)
GFR calc Af Amer: >60 mL/min (ref >60)
GFR calc non Af Amer: >60 mL/min (ref >60)
GLUCOSE: 208 mg/dL — AB (ref 65–99)
Potassium: 3.6 mmol/L (ref 3.5–5.1)
SODIUM: 137 mmol/L (ref 135–145)
Total Protein: 8.1 g/dL (ref 6.5–8.1)

## 2016-07-03 LAB — I-STAT CHEM 8, ED
BUN: 20 mg/dL (ref 6–20)
CALCIUM ION: 1.25 mmol/L (ref 1.15–1.40)
Chloride: 96 mmol/L — ABNORMAL LOW (ref 101–111)
Creatinine, Ser: 0.5 mg/dL (ref 0.44–1.00)
GLUCOSE: 215 mg/dL — AB (ref 65–99)
HCT: 39 % (ref 36.0–46.0)
HEMOGLOBIN: 13.3 g/dL (ref 12.0–15.0)
POTASSIUM: 3.6 mmol/L (ref 3.5–5.1)
Sodium: 138 mmol/L (ref 135–145)
TCO2: 28 mmol/L (ref 0–100)

## 2016-07-03 LAB — URINALYSIS, ROUTINE W REFLEX MICROSCOPIC
BILIRUBIN URINE: NEGATIVE
Glucose, UA: NEGATIVE mg/dL
KETONES UR: 5 mg/dL — AB
NITRITE: NEGATIVE
PROTEIN: 30 mg/dL — AB
SPECIFIC GRAVITY, URINE: 1.029 (ref 1.005–1.030)
SQUAMOUS EPITHELIAL / LPF: NONE SEEN
pH: 5 (ref 5.0–8.0)

## 2016-07-03 LAB — I-STAT CG4 LACTIC ACID, ED: LACTIC ACID, VENOUS: 1.06 mmol/L (ref 0.5–1.9)

## 2016-07-03 LAB — LIPASE, BLOOD: LIPASE: 16 U/L (ref 11–51)

## 2016-07-03 MED ORDER — FENTANYL CITRATE (PF) 100 MCG/2ML IJ SOLN
50.0000 ug | Freq: Once | INTRAMUSCULAR | Status: AC
  Administered 2016-07-03: 50 ug via INTRAVENOUS
  Filled 2016-07-03: qty 2

## 2016-07-03 MED ORDER — MILK AND MOLASSES ENEMA
1.0000 | Freq: Once | RECTAL | Status: AC
  Administered 2016-07-03: 250 mL via RECTAL
  Filled 2016-07-03: qty 250

## 2016-07-03 MED ORDER — DEXTROSE 5 % IV SOLN
1.0000 g | Freq: Once | INTRAVENOUS | Status: AC
  Administered 2016-07-03: 1 g via INTRAVENOUS
  Filled 2016-07-03: qty 10

## 2016-07-03 MED ORDER — IOPAMIDOL (ISOVUE-300) INJECTION 61%
INTRAVENOUS | Status: AC
  Administered 2016-07-03: 30 mL via ORAL
  Filled 2016-07-03: qty 30

## 2016-07-03 MED ORDER — HEPARIN SOD (PORK) LOCK FLUSH 10 UNIT/ML IV SOLN
10.0000 [IU] | Freq: Once | INTRAVENOUS | Status: DC
  Filled 2016-07-03: qty 1

## 2016-07-03 MED ORDER — ONDANSETRON HCL 4 MG/2ML IJ SOLN
4.0000 mg | Freq: Once | INTRAMUSCULAR | Status: AC
  Administered 2016-07-03: 4 mg via INTRAVENOUS
  Filled 2016-07-03: qty 2

## 2016-07-03 MED ORDER — CEPHALEXIN 500 MG PO CAPS: 500.0000 mg | ORAL_CAPSULE | Freq: Four times a day (QID) | ORAL | 0 refills | Status: AC

## 2016-07-03 MED ORDER — IOPAMIDOL (ISOVUE-300) INJECTION 61%
INTRAVENOUS | Status: AC
  Filled 2016-07-03: qty 100

## 2016-07-03 MED ORDER — IOPAMIDOL (ISOVUE-300) INJECTION 61%
100.0000 mL | Freq: Once | INTRAVENOUS | Status: AC | PRN
Start: 2016-07-03 — End: 2016-07-03
  Administered 2016-07-03: 100 mL via INTRAVENOUS

## 2016-07-03 MED ORDER — IOPAMIDOL (ISOVUE-300) INJECTION 61%
30.0000 mL | Freq: Once | INTRAVENOUS | Status: AC | PRN
  Administered 2016-07-03: 30 mL via ORAL

## 2016-07-03 MED ORDER — HEPARIN SOD (PORK) LOCK FLUSH 100 UNIT/ML IV SOLN
500.0000 [IU] | Freq: Once | INTRAVENOUS | Status: AC
  Administered 2016-07-03: 500 [IU]
  Filled 2016-07-03: qty 5

## 2016-07-03 NOTE — Telephone Encounter (Signed)
Received message from On Call RN from last hs @ 8:53 pm where pt had vomited after 2 doses of sorbitol & having severe abdominal pain & constipation.  Called & spoke with sister [name] & she reports pt was seen in the ED & was given an enema.  She was also found to have a UTI & was given an ATB.  Pt spent all night in ED & is home now as of @ 10:30 am & resting.  [name] reports that she was instructed to take miralax daily.  They were also concerned with pt's weight & encouraged small freq meals.  They have worked on a schedule for meds & foods/liquids & will try to follow this. Pt has an appt with Ned Card NP next week & knows to call if further problems.  Message routed to Dr Sherrill/Pod RN

## 2016-07-03 NOTE — Discharge Instructions (Signed)
It was my pleasure taking care of you today!  You need to follow up with your oncologist and primary care provider for further discussion of today's diagnosis.   Please take all of your antibiotics until finished!   Return to ER for inability to tolerate fluids, fevers, new or worsening symptoms, any additional concerns.

## 2016-07-03 NOTE — ED Notes (Signed)
Patient informed for need of urine

## 2016-07-03 NOTE — ED Provider Notes (Signed)
[facility] DEPT Provider Note   CSN: [id number] Arrival date & time: 07/02/16  2126  By signing my name below, I, Dora Sims, attest that this documentation has been prepared under the direction and in the presence of [facility], PA-C. Electronically Signed: Dora Sims, Scribe. 07/03/2016. 1:08 AM.  History   Chief Complaint Chief Complaint  Patient presents with  . Constipation    The history is provided by the patient. No language interpreter was used.     HPI Comments: Anne [name] is a 71 y.o. [name] with PMHx including colon/rectal cancer with lung metastasis who presents to the Emergency Department complaining of constant constipation for a few days. She states she has not had a BM for 2-3 days and notes that her rectum does not feel full or impacted. She reports some associated abdominal distention, nausea, and one episode of vomiting. Patient has been passing flatulence regularly. She has baseline abdominal pain secondary to colon cancer and notes some acute worsening of this pain for the last few weeks. Patient tried oxycodone about three hours ago with mild improvement of her abdominal pain. No other medications or treatments tried. She saw her oncologist on 4/4 for her symptoms and had an x-ray taken which revealed no bowel obstruction and "a lot of stool". Her last chemotherapy treatment was ~2 months ago; she was supposed to go to [place] yesterday (4/5) to start a new trial but did not make it. Pt denies fevers, CP, SOB, or any other associated symptoms.  Past Medical History:  Diagnosis Date  . Asthma    as a child none since 51 years old  . Colon cancer ([place])    rectal cancer with lung metastasis  . Complication of anesthesia    pt hadpanic attacks with versed, no versed  . Diabetes mellitus without complication ([place])   . Early cataract    stoneburner   . Family history of malignant neoplasm of breast   . Family history of malignant neoplasm of ovary   .  Fracture of left foot    2015 trip  . 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 Headache(784.0)   . Heart murmur, systolic 3/0/8657   innocent per Dr. Gershon Mussel   . High frequency hearing loss    kraus followed   . History of renal stone 6 12   dahlstedt  . Hx of colonic polyps   . Hx of compression fracture of spine 1985   t12 after a  20 foot fall  . Hx of fracture of wrist    after fall   . Hx of hyperglycemia 07/01/2011  . Postmenopausal HRT (hormone replacement therapy) 07/01/2011   surgical menopause  . Skin cancer    multiple  . Status post chemotherapy    completed a total of three cycles of Folfox     Patient Active Problem List   Diagnosis Date Noted  . Constipation 07/02/2016  . Urine abnormality 05/01/2016  . Port catheter in place 12/26/2015  . Nausea without vomiting 11/11/2015  . Peripheral edema 11/11/2015  . 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 Local recurrence of rectal cancer  ([place]) 11/11/2015  . Anorexia 09/12/2014  . Weight loss 09/12/2014  . Neoplastic malignant related fatigue 09/12/2014  . Dehydration 09/12/2014  . Type 2  diabetes mellitus without complication ([place]) 50/35/4656  . Essential hypertension 01/05/2014  . 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 Family history of malignant neoplasm of breast   . Family history of malignant neoplasm of ovary   . Abdominal pain 05/12/2013  . Left upper arm pain 03/21/2013  . Shortness of breath 03/21/2013  . S/P thoracotomy 02/27/2013  . Elevated blood pressure reading 11/02/2012  . 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 DM type 2 (diabetes mellitus, type 2) ([place]) 10/23/2012  . Hx of colonic polyps   . Early cataract   . Heart murmur, systolic  81/27/5170  . Chest pain, unspecified 07/01/2011  . Postmenopausal HRT (hormone replacement therapy) 07/01/2011  . Hx of hyperglycemia 07/01/2011  . High frequency hearing loss 07/01/2011    Past Surgical History:  Procedure Laterality Date  . ADENOIDECTOMY  1952  . [place]   right [place]  . COLONOSCOPY N/A 10/24/2012   Procedure: COLONOSCOPY;  Surgeon: Irene Shipper, MD;  Location: [facility] ENDOSCOPY;  Service: Endoscopy;  Laterality: N/A;  . COLONOSCOPY N/A 10/25/2012   Procedure: COLONOSCOPY;  Surgeon: Irene Shipper, MD;  Location: [facility];  Service: Endoscopy;  Laterality: N/A;  . compression fraction  1985   T12  . COSMETIC SURGERY  1999-2007   holderness blepharoplasty facial rhytidectomy mastopexy abdominoplasty   . DECORTICATION Right 12/21/2012   Procedure: DECORTICATION;  Surgeon: Melrose Nakayama, MD;  Location: [facility];  Service: Thoracic;  Laterality: Right;  . dental implant    . DILATION AND CURETTAGE OF UTERUS  1977  . PLEURAL EFFUSION DRAINAGE Right 12/21/2012   Procedure: DRAINAGE OF PLEURAL EFFUSION;  Surgeon: Melrose Nakayama, MD;  Location: [facility];  Service: Thoracic;  Laterality: Right;  . PORTACATH PLACEMENT Left 11/21/2012   Procedure: INSERTION PORT-A-CATH;  Surgeon: Melrose Nakayama, MD;  Location: [facility];  Service: Thoracic;  Laterality: Left;  . THOROCOTOMY WITH LOBECTOMY Right 02/27/2013   Procedure: THOROCOTOMY WITH BI- LOBECTOMY;  Surgeon: Melrose Nakayama, MD;  Location: [facility];  Service: Thoracic;  Laterality: Right;  . TONSILLECTOMY  1952  . TOTAL ABDOMINAL HYSTERECTOMY  2005   with cystocele repair   . VIDEO ASSISTED THORACOSCOPY Right 12/21/2012   Procedure: VIDEO ASSISTED THORACOSCOPY;  Surgeon: Melrose Nakayama, MD;  Location: [facility];  Service: Thoracic;  Laterality: Right;  Marland Kitchen VIDEO BRONCHOSCOPY Bilateral 10/27/2012   Procedure: VIDEO BRONCHOSCOPY WITH FLUORO;  Surgeon: Chesley Mires, MD;  Location: [facility]  ENDOSCOPY;  Service: Endoscopy;  Laterality: Bilateral;  . WRIST SURGERY  1957   left reduction    OB History    No data available       Home Medications    Prior to Admission medications   Medication Sig Start Date End Date Taking? Authorizing Provider  amLODipine (NORVASC) 10 MG tablet TAKE ONE TABLET EVERY DAY 02/13/16  Yes Burnis Medin, MD  diphenhydrAMINE (BENADRYL) 25 mg capsule Take 50 mg by mouth every 6 (six) hours as needed for itching, allergies or sleep.   Yes Historical Provider, MD  estradiol (MINIVELLE) 0.0375 MG/24HR Place 1 patch onto the skin 2 (two) times a week. 02/10/16  Yes Ladell Pier, MD  hydrochlorothiazide (MICROZIDE) 12.5 MG capsule TAKE ONE CAPSULE EACH DAY 12/05/15  Yes Ladell Pier, MD  JANUVIA 100 MG tablet TAKE ONE TABLET EACH DAY 04/08/16  Yes Burnis Medin, MD  metFORMIN (  GLUCOPHAGE) 1000 MG tablet TAKE ONE TABLET TWICE DAILY WITH A MEAL 02/13/16  Yes Burnis Medin, MD  Multiple Vitamin (MULTIVITAMIN WITH MINERALS) TABS tablet Take 1 tablet by mouth daily.   Yes Historical Provider, MD  oxyCODONE-acetaminophen (PERCOCET) 10-325 MG tablet Take 1 tablet by mouth every 4 (four) hours as needed for pain. 07/01/16  Yes Susanne Borders, NP  cephALEXin (KEFLEX) 500 MG capsule Take 1 capsule (500 mg total) by mouth 4 (four) times daily. 07/03/16   Arav Bannister Pilcher Hadyn Blanck, PA-C  estrogens-methylTEST (ESTRATEST) 1.25-2.5 MG tablet Take 1 tablet by mouth daily. Methyltestosterone (Compounded medication) Patient not taking: Reported on 07/03/2016 05/06/16   Ladell Pier, MD  ondansetron (ZOFRAN) 8 MG tablet Take 1 tablet (8 mg total) by mouth every 8 (eight) hours as needed for nausea or vomiting. Patient not taking: Reported on 05/01/2016 04/08/15   Ladell Pier, MD  sorbitol 70 % solution Take 15 mLs by mouth every 2 (two) hours as needed. Patient not taking: Reported on 07/03/2016 07/01/16   Susanne Borders, NP    Family History Family History  Problem Relation Age  of Onset  . Diabetes Father   . Thyroid disease Father   . Heart attack Father     died age 10  . Skin cancer Father   . Dementia Mother   . Hypertension Mother   . Hyperlipidemia Mother   . Obesity Mother   . Cancer Brother     retro lymphosarcoma agent orange  . Breast cancer Sister 16    BRIP1 pathogenic mutation  . Ovarian cancer Sister 81    currently 3  . Diabetes Brother   . Obesity Brother   . Hypertension Sister   . Asthma Sister   . Cancer Paternal Uncle     unk. primary; deceased 39    Social History Social History  Substance Use Topics  . Smoking status: Never Smoker  . Smokeless tobacco: Never Used  . Alcohol use 1.0 oz/week    2 Standard drinks or equivalent per week     Comment: occ     Allergies   Ativan [lorazepam]; Midazolam; Zolpidem tartrate; Bacitracin; Moviprep [peg-kcl-nacl-nasulf-na asc-c]; and Neomycin-bacitracin zn-polymyx   Review of Systems Review of Systems  Constitutional: Negative for fever.  Respiratory: Negative for shortness of breath.   Cardiovascular: Negative for chest pain.  Gastrointestinal: Positive for abdominal distention, abdominal pain, constipation, nausea and vomiting.  All other systems reviewed and are negative.  Physical Exam Updated Vital Signs BP (!) 164/96 (BP Location: Left Arm)   Pulse 91   Temp 98.3 F (36.8 C) (Oral)   Resp 18   Ht _0  (1.626 m)   Wt 52.2 kg   SpO2 97%   BMI 19.74 kg/m   Physical Exam  Constitutional: She is oriented to person, place, and time. She appears well-developed and well-nourished. No distress.  HENT:  Head: Normocephalic and atraumatic.  Cardiovascular: Normal rate, regular rhythm and normal heart sounds.   No murmur heard. Pulmonary/Chest: Effort normal and breath sounds normal. No respiratory distress.  Abdominal:  Slightly distended with tenderness across the upper abdomen. Hyperactive bowel sounds.   Musculoskeletal: She exhibits no edema.  Neurological: She  is alert and oriented to person, place, and time.  Skin: Skin is warm and dry.  Nursing note and vitals reviewed.  ED Treatments / Results  Labs (all labs ordered are listed, but only abnormal results are displayed) Labs Reviewed  COMPREHENSIVE METABOLIC PANEL -  Abnormal; Notable for the following:       Result Value   Chloride 98 (*)    Glucose, Bld 208 (*)    BUN 22 (*)    ALT 11 (*)    All other components within normal limits  CBC WITH DIFFERENTIAL/PLATELET - Abnormal; Notable for the following:    WBC 12.7 (*)    Hemoglobin 11.6 (*)    HCT 35.5 (*)    Platelets 607 (*)    Neutro Abs 10.9 (*)    All other components within normal limits  URINALYSIS, ROUTINE W REFLEX MICROSCOPIC - Abnormal; Notable for the following:    Color, Urine AMBER (*)    APPearance HAZY (*)    Hgb urine dipstick MODERATE (*)    Ketones, ur 5 (*)    Protein, ur 30 (*)    Leukocytes, UA MODERATE (*)    Bacteria, UA MANY (*)    All other components within normal limits  I-STAT CHEM 8, ED - Abnormal; Notable for the following:    Chloride 96 (*)    Glucose, Bld 215 (*)    All other components within normal limits  URINE CULTURE  LIPASE, BLOOD  I-STAT CG4 LACTIC ACID, ED    EKG  EKG Interpretation None       Radiology Dg Abd 1 View  Result Date: 07/01/2016 CLINICAL DATA:  Abdominal cramping, pain and vomiting EXAM: ABDOMEN - 1 VIEW COMPARISON:  11/12/2015 FINDINGS: Single view abdomen demonstrates a nonobstructed bowel gas pattern with large amount of stool throughout the colon. Postsurgical changes in the low pelvis. Vascular calcifications. Compression deformities at T12 and L1. IMPRESSION: Nonobstructed gas pattern with large amount of stool in the colon. Electronically Signed   By: Donavan Foil M.D.   On: 07/01/2016 14:00   Ct Abdomen Pelvis W Contrast  Result Date: 07/03/2016 CLINICAL DATA:  71 y/o F; rectal cancer with lung metastasis. Increasing abdominal pain for the last few weeks.  EXAM: CT ABDOMEN AND PELVIS WITH CONTRAST TECHNIQUE: Multidetector CT imaging of the abdomen and pelvis was performed using the standard protocol following bolus administration of intravenous contrast. CONTRAST:  100 cc Isovue-300 COMPARISON:  11/11/2015 CT the abdomen and pelvis. FINDINGS: Lower chest: Partially visualized pulmonary metastasis within the lung bases bilaterally are increased in size from the prior CT of abdomen and pelvis for example a mass within the right posteromedial lower lobe measures 53 x 39 mm, previously 43 x 31 mm (series 4: Image 1). Moderate coronary artery calcification. Hepatobiliary: Interval development of numerous liver parenchyma metastasis the largest in segment 8 near the dome of the liver measuring 44 x 39 mm (series 2, image 7). Normal gallbladder. No intra or extrahepatic biliary ductal dilatation. Pancreas: Unremarkable. No pancreatic ductal dilatation or surrounding inflammatory changes. Spleen: Normal in size without focal abnormality. Adrenals/Urinary Tract: Increased size of right adrenal metastasis measuring 52 x 21 mm, previously 33 x 19 mm (series 2, image 15). No focal kidney lesion identified. Left kidney interpolar 3 mm and right kidney interpolar punctate nonobstructing stones. No hydronephrosis. Normal bladder. Stomach/Bowel: No inflammatory changes of bowel identified. The colon is moderate distended with a large volume of stool compatible with constipation. Peritoneal carcinomatosis surrounding the anal rectal anastomosis may be exerting mass effect and functional stenosis with obstruction of colon outflow. Vascular/Lymphatic: Masses along the pelvic sidewalls best appreciated on the left measuring up to 33 x 23 mm (series 2, image 67) probably represents pelvic metastatic lymphadenopathy. Additionally there are  multiple enlarged retroperitoneal lymph nodes in the para-aortic/pericaval region extending into the upper retroperitoneum compatible with metastatic  disease. Mild calcific atherosclerosis of the abdominal aorta and bilateral iliofemoral arteries. Reproductive: Status post hysterectomy. No adnexal masses. Other: Interval development of peritoneal carcinomatosis with multiple masses for example: 1. Left upper quadrant adjacent to abdominal wall measuring 24 mm, series 2, image 28. 2. Mid abdomen measuring 20 mm (series 2, image 36) 3. Left hemipelvis measuring 27 mm (series 2, image 55) 4. Numerous masses along the surface of the liver adjacent to median lobe of spleen. Musculoskeletal: Stable T12 and L5 compression deformities and grade 1 L4-5 anterolisthesis. Retropulsion of superior endplate of L5 results in stable moderate to severe canal stenosis. No acute osseous abnormality is evident. IMPRESSION: 1. Increase in size of pulmonary metastasis in the lung bases. 2. Interval development of diffuse peritoneal carcinomatosis. 3. Interval development of multiple liver metastasis. 4. Interval development of pelvic and retroperitoneal metastatic lymphadenopathy. 5. Moderate distention of the colon with large volume of stool probably representing constipation. Peritoneal carcinomatosis surrounding the anal rectal anastomosis may exert mass effect and functional stenosis with obstruction of colonic outflow. Electronically Signed   By: Kristine Garbe M.D.   On: 07/03/2016 05:25    Procedures Procedures (including critical care time)  DIAGNOSTIC STUDIES: Oxygen Saturation is 93% on RA, adequate by my interpretation.    COORDINATION OF CARE: 1:17 AM Discussed treatment plan with pt at bedside and pt agreed to plan.  Medications Ordered in ED Medications  iopamidol (ISOVUE-300) 61 % injection (not administered)  iopamidol (ISOVUE-300) 61 % injection 30 mL (30 mLs Oral Contrast Given 07/03/16 0051)  ondansetron (ZOFRAN) injection 4 mg (4 mg Intravenous Given 07/03/16 0140)  fentaNYL (SUBLIMAZE) injection 50 mcg (50 mcg Intravenous Given 07/03/16 0256)    iopamidol (ISOVUE-300) 61 % injection 100 mL (100 mLs Intravenous Contrast Given 07/03/16 0425)  cefTRIAXone (ROCEPHIN) 1 g in dextrose 5 % 50 mL IVPB (0 g Intravenous Stopped 07/03/16 0834)  milk and molasses enema (250 mLs Rectal Given 07/03/16 0812)  heparin lock flush 100 unit/mL (500 Units Intracatheter Given 07/03/16 0831)     Initial Impression / Assessment and Plan / ED Course  I have reviewed the triage vital signs and the nursing notes.  Pertinent labs & imaging results that were available during my care of the patient were reviewed by me and considered in my medical decision making (see chart for details).    Anne [name] is a 71 y.o. [name] with history of colon cancer who presents to ED for constipation and abdominal pain x 2 days. Afebrile, hemodynamically stable with mild abdominal distention. Mild leukocytosis, 12.7.  UA with moderate leuks, TNTC wbc's. Ucx sent and dose of rocephin given in ED.   IMPRESSION: 1. Increase in size of pulmonary metastasis in the lung bases. 2. Interval development of diffuse peritoneal carcinomatosis. 3. Interval development of multiple liver metastasis. 4. Interval development of pelvic and retroperitoneal metastatic lymphadenopathy. 5. Moderate distention of the colon with large volume of stool probably representing constipation. Peritoneal carcinomatosis surrounding the anal rectal anastomosis may exert mass effect and functional stenosis with obstruction of colonic outflow.  Enema given in ED. Home care instructions discussed with patient and daughter at bedside. Patient understands importance of following up with her oncologist and daughter agrees to call this morning to schedule appointment. Rx for keflex given to treat uti. Return precautions discussed and all questions answered.   Patient seen by and discussed with  Dr. Florina Ou who agrees with treatment plan.   Final Clinical Impressions(s) / ED Diagnoses   Final diagnoses:  Acute  cystitis without hematuria  Constipation, unspecified constipation type    New Prescriptions New Prescriptions   CEPHALEXIN (KEFLEX) 500 MG CAPSULE    Take 1 capsule (500 mg total) by mouth 4 (four) times daily.   I personally performed the services described in this documentation, which was scribed in my presence. The recorded information has been reviewed and is accurate.    [facility] Cherryl Babin, PA-C 07/03/16 [place], MD 07/03/16 1007

## 2016-07-05 LAB — URINE CULTURE

## 2016-07-06 ENCOUNTER — Telehealth: Payer: Self-pay | Admitting: Emergency Medicine

## 2016-07-06 NOTE — Telephone Encounter (Signed)
Post ED Visit - Positive Culture Follow-up  Culture report reviewed by antimicrobial stewardship pharmacist:  '[]'$  Elenor Quinones, Pharm.D. '[x]'$  Heide Guile, Pharm.D., BCPS AQ-ID '[]'$  Parks Neptune, Pharm.D., BCPS '[]'$  Alycia Rossetti, Pharm.D., BCPS '[]'$  [place], Pharm.D., BCPS, AAHIVP '[]'$  Legrand Como, Pharm.D., BCPS, AAHIVP '[]'$  Salome Arnt, PharmD, BCPS '[]'$  Dimitri Ped, PharmD, BCPS '[]'$  Vincenza Hews, PharmD, BCPS  Positive urine culture Treated with cephalexin, organism sensitive to the same and no further patient follow-up is required at this time.  Hazle Nordmann 07/06/2016, 9:45 AM

## 2016-07-08 ENCOUNTER — Ambulatory Visit (HOSPITAL_BASED_OUTPATIENT_CLINIC_OR_DEPARTMENT_OTHER): Payer: Medicare Other | Admitting: Nurse Practitioner

## 2016-07-08 ENCOUNTER — Other Ambulatory Visit: Payer: Medicare Other

## 2016-07-08 VITALS — BP 117/75 | HR 103 | Temp 97.8°F | Resp 17 | Ht 64.0 in | Wt 109.0 lb

## 2016-07-08 DIAGNOSIS — C786 Secondary malignant neoplasm of retroperitoneum and peritoneum: Secondary | ICD-10-CM

## 2016-07-08 DIAGNOSIS — C20 Malignant neoplasm of rectum: Secondary | ICD-10-CM

## 2016-07-08 DIAGNOSIS — C787 Secondary malignant neoplasm of liver and intrahepatic bile duct: Secondary | ICD-10-CM | POA: Diagnosis not present

## 2016-07-08 DIAGNOSIS — C7800 Secondary malignant neoplasm of unspecified lung: Secondary | ICD-10-CM | POA: Diagnosis not present

## 2016-07-08 DIAGNOSIS — R109 Unspecified abdominal pain: Secondary | ICD-10-CM

## 2016-07-08 DIAGNOSIS — C778 Secondary and unspecified malignant neoplasm of lymph nodes of multiple regions: Secondary | ICD-10-CM

## 2016-07-08 MED ORDER — HYDROCODONE-ACETAMINOPHEN 5-325 MG PO TABS
2.0000 | ORAL_TABLET | Freq: Once | ORAL | Status: AC
  Administered 2016-07-08: 2 via ORAL

## 2016-07-08 MED ORDER — HYDROCODONE-ACETAMINOPHEN 5-325 MG PO TABS
ORAL_TABLET | ORAL | Status: AC
  Filled 2016-07-08: qty 2

## 2016-07-08 NOTE — Progress Notes (Addendum)
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 She was seen in the emergency department 07/03/2016 with constipation. CT scan showed increase in size of pulmonary metastasis, interval development of diffuse peritoneal carcinomatosis, interval development of multiple liver metastases and interval development of pelvic and retroperitoneal metastatic lymphadenopathy. The colon was moderately distended with a large volume of stool. The scan was compared to a CT scan from 11/11/2015. She was given an enema.  She has had 2 or 3 very small bowel movements since the emergency room visit. She is taking Miralax two capfuls a day. She has had some nausea. For abdominal pain she takes hydrocodone with good relief. She is hesitant to take pain medicine due to the current constipation. No recent vomiting. Appetite remains poor.  Objective:  Vital signs in last 24 hours:  Blood pressure 117/75, pulse (!) 103, temperature 97.8 F (36.6 C), temperature source Oral, resp. rate 17, height _0  (1.626 m), weight 109 lb (49.4 kg), SpO2 97 %.    HEENT: No thrush or ulcers. Resp: Lungs clear bilaterally. Cardio: Regular rate and rhythm. GI: Abdomen is distended; diffuse mild tenderness. Question ascites. Vascular: No leg edema. Port-A-Cath without erythema.  Lab Results:  Lab Results  Component Value Date   WBC 12.7 (H) 07/03/2016   HGB 13.3 07/03/2016   HCT 39.0 07/03/2016   MCV 84.7 07/03/2016   PLT 607 (H) 07/03/2016   NEUTROABS 10.9 (H) 07/03/2016    Imaging:  No results found.  Medications: I have reviewed the patient's current medications.  Assessment/Plan: 1. Metastatic rectal cancer diagnosed July 2014. MSI stable on the March 2015 resection specimen. Foundation 1 Testing confirmed a KRAS G13 mutation, APC mutation, and BRIP1 mutation.  Colonoscopy on 10/25/2012 showed a bulky circumferential  friable mass lesion in the rectum at approximately 4 cm above the dentate line. The mass was subtotally obstructing and would not permit passage of the colonoscope proximal. Biopsy showed adenocarcinoma.   Staging CT scans 10/25/2012 showed a right lower lobe lung mass measuring 5 x 4.6 cm; 0.8 cm nodule right upper lobe; masslike rectosigmoid wall thickening, surrounding stranding and adjacent lymphadenopathy.   PET scan 11/02/2012 showed hypermetabolism in the area of apparent circumferential rectal wall thickening; the 5 cm right lower lobe pulmonary mass was hypermetabolic with SUV max equal 8. No other hypermetabolic lung lesions were evident. Small bilateral pulmonary parenchymal nodules were noted.   She completed 5 cycles of FOLFOX chemotherapy 11/22/2012 through 02/13/2013.   Status post right thoracotomy, bilobectomy (right lower and middle lobectomy) with en bloc resection of the diaphragm 02/27/2013. Pathology showed adenocarcinoma consistent with metastatic colorectal adenocarcinoma, 4.3 cm, negative margins, no lymph node involvement. Negative soft tissue biopsy of the diaphragm x2.Positive for K-ras mutation-codon 13   She completed concurrent radiation and Xeloda for treatment of the rectal tumor 03/06/2013 through 04/11/2013.   Status post low anterior resection with a diverting loop ileostomy on 05/31/2013 at [facility] in [place]. Final pathology showed moderately differentiated adenocarcinoma located in the upper rectum; the tumor measured 2.5 x 2.2 cm; there was invasion through the muscularis propria into pericolic adipose tissue; tumor budding was present. Lymphatic status negative; vascular status negative; proximal and distal surgical margins negative; 2 of 17 lymph nodes showed metastatic adenocarcinoma. Mesenteric tumor deposits were present. Perineural invasion present. [facility] and the tumor was staged as a  T4a   CT abdomen/pelvis 05/12/2013. Metastatic nodules in the lung increased. Chronic loculated right base pneumothorax. Diffuse mucosal thickening of the sigmoid portion of the colon.   Restaging CTs 07/25/2013 with enlargement of bilateral lung nodules, no other evidence of metastatic disease   Ileostomy takedown 08/14/2013   Chest CT 10/02/2013 revealed enlargement of bilateral lung nodules and increased right pleural fluid   Cycle 1 of FOLFIRI/Avastin 10/03/2013   Cycle 6 of FOLFIRI/Avastin 12/12/2013   Restaging CT 12/22/2013 with a decrease in the size of bilateral lung nodules, no evidence of disease progression   Cycle 7 FOLFIRI/Avastin 12/25/2013  Cycle 8 FOLFIRI/Avastin 01/10/2014  Cycle 9 FOLFIRI/Avastin 01/24/2014  Cycle 10 FOLFIRI/Avastin 02/07/2014  Cycle of 11 FOLFIRI/Avastin 02/21/2014  Restaging CT 03/05/2014 with a decrease in the size of 2 dominant right lung nodules, no new nodules, new pleural gas collection at the right apex  Cycle 12 FOLFIRI/Avastin 03/07/2014  Cycle 13 FOLFIRI/Avastin 03/20/2014  Cycle 14 FOLFIRI/Avastin 04/04/2014  Initiation of maintenance Xeloda/Avastin 04/18/2014  CT chest 07/09/2014 revealed slight enlargement of lung nodules  Treatment with single agent Avastin 07/10/2014  Resumption of FOLFIRI/Avastin on a 2 week schedule 07/25/2014  CT angio chest 09/14/2014 with essentially stable multifocal pulmonary metastatic disease.  5-fluorouracil dose reduced beginning 09/19/2014  FOLFIRI/Avastin switch to a 3 week schedule beginning with treatment 10/10/2014 secondary to malaise, diarrhea, and mucositis, irinotecan dose reduced  CT 12/26/2014 confirmed an increase in the size of bilateral lung nodules  FOLFOX/Avastin initiated on a 2 week schedule 01/02/2015  CT chest 03/20/2015 with slight enlargement of multiple lung nodules  Enrollment on an immunotherapy trial at [facility] March 2017-treatment began with Nivolumab,  Ipilumumab, and cobimetinib 06/11/2015  Treatment placed on hold due to a skin rash 09/30/2015  Restaging CT scans 10/28/2015 with interval increase in the size of multiple bilateral pulmonary nodules and masses, increased size of right adrenal mass  Treatment resumed 10/29/2015  [facility] CT[facility] at [facility] 12/11/2015-stable lung nodules/masses, 2 new liver lesions suspicious for metastases, stable adrenal mass   Taken off of the [facility] study secondary to disease progression  Cycle 1 Lonsurf 01/13/2016  Cycle 2 Lonsurf11/20/2017  Cycle 3Lonsurf 03/25/2016   CT chest 04/08/2016-enlargement of lung metastases, a few new less than 1 cm metastases, new and enlarging liver lesions, enlarging right adrenal metastasis   CT abdomen/pelvis 07/03/2016-increase in size of pulmonary metastasis in the lung bases. Interval development of diffuse peritoneal carcinomatosis, multiple liver metastases, pelvic and retroperitoneal metastatic lymphadenopathy. Moderate distention of the colon with large volume of stool.  2. Status post right VATS, drainage of pleural effusion, visceral and parietal pleural decortication 12/21/2012. Pleural fluid culture positive for viridans Streptococcus. Right pleura biopsies negative for malignancy. Findings favored empyema. 3. Diabetes. 4. Frequent bowel movements, diminished pelvic muscle tone-we made a referral to the pelvic physical therapy clinic 5. Pain in the left leg with tenderness at the left calf and left ischium-MRI of the lumbar spine and pelvis on 10/13/2011 with no evidence of metastatic disease. A right iliac bone "lesion" was felt to most likely represent heterogenous marrow. Edema and enhancement was noted at the left sciatic nerve compared to the right side with no mass lesion 6. Hypertension-on HCTZ 7. Left foot fracture 8. BRIP1 mutation 9. Mucositis most likely related to 5-fluorouracil. Dose reduction of 5 fluorouracil beginning  09/19/2014. 10. Erythematous rash-nivolumab,ipilumumab, or cobimetinib 11. 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 Admission 11/11/2015 with a cecal volvulus-spontaneously resolved 13. 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 We reviewed the CT images on the computer with Dr. [name] and her daughter at today's visit. Her performance status is declining. She has been considering enrollment on a clinical trial at [facility]. In her current condition she does not feel she could tolerate any treatment. We are in agreement with her assessment.  She likely has an ileus related to carcinomatosis. She will begin a low residual/liquid diet. She will continue Miralax and also begin sorbitol. If her bowels do not begin moving within the next 24 hours we will make further adjustments to the laxative regimen.  She will continue hydrocodone for the abdominal pain. We gave her a dose of hydrocodone while in the office today.  She will return for a follow-up visit on 07/14/2016.  Patient seen with Dr. Benay Spice. 25 minutes were spent face-to-face at today's visit with the majority of that time involved in counseling/coordination of care.   Ned Card ANP/GNP-BC   07/08/2016  11:51 AM  This was a shared visit with Ned Card. 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 We reviewed the recent CT images with Dr. [name] and her daughter. There is clinical and x-ray evidence of progressive carcinomatosis. I suspect the carcinomatosis is causing obstructive symptoms. We adjusted the bowel regimen today.  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 She is not a candidate for a clinical trial in her current condition. She will return for an office visit on 07/14/2016. She will contact us if the constipation has not improved on 07/27/16.  Julieanne Manson, M.D.

## 2016-07-09 ENCOUNTER — Ambulatory Visit (HOSPITAL_BASED_OUTPATIENT_CLINIC_OR_DEPARTMENT_OTHER): Payer: Medicare Other | Admitting: Oncology

## 2016-07-09 ENCOUNTER — Encounter: Payer: Self-pay | Admitting: General Practice

## 2016-07-09 ENCOUNTER — Telehealth: Payer: Self-pay | Admitting: *Deleted

## 2016-07-09 ENCOUNTER — Inpatient Hospital Stay (HOSPITAL_COMMUNITY): Admission: AD | Admit: 2016-07-09 | Payer: Medicare Other | Source: Ambulatory Visit | Admitting: Oncology

## 2016-07-09 ENCOUNTER — Telehealth: Payer: Self-pay | Admitting: Oncology

## 2016-07-09 ENCOUNTER — Other Ambulatory Visit: Payer: Self-pay | Admitting: Nurse Practitioner

## 2016-07-09 ENCOUNTER — Encounter: Payer: Self-pay | Admitting: *Deleted

## 2016-07-09 VITALS — BP 51/28 | HR 82 | Resp 0 | Ht 64.0 in | Wt 106.0 lb

## 2016-07-09 DIAGNOSIS — C20 Malignant neoplasm of rectum: Secondary | ICD-10-CM

## 2016-07-09 DIAGNOSIS — C7800 Secondary malignant neoplasm of unspecified lung: Secondary | ICD-10-CM | POA: Diagnosis not present

## 2016-07-09 DIAGNOSIS — Z95828 Presence of other vascular implants and grafts: Secondary | ICD-10-CM

## 2016-07-09 DIAGNOSIS — C778 Secondary and unspecified malignant neoplasm of lymph nodes of multiple regions: Secondary | ICD-10-CM | POA: Diagnosis not present

## 2016-07-09 DIAGNOSIS — R112 Nausea with vomiting, unspecified: Secondary | ICD-10-CM

## 2016-07-09 DIAGNOSIS — R109 Unspecified abdominal pain: Secondary | ICD-10-CM

## 2016-07-09 DIAGNOSIS — C787 Secondary malignant neoplasm of liver and intrahepatic bile duct: Secondary | ICD-10-CM

## 2016-07-09 DIAGNOSIS — C786 Secondary malignant neoplasm of retroperitoneum and peritoneum: Secondary | ICD-10-CM | POA: Diagnosis not present

## 2016-07-09 MED ORDER — SODIUM CHLORIDE 0.9 % IV SOLN
Freq: Once | INTRAVENOUS | Status: AC
  Administered 2016-07-09: 10:00:00 via INTRAVENOUS

## 2016-07-09 MED ORDER — MORPHINE SULFATE 4 MG/ML IJ SOLN
2.0000 mg | Freq: Once | INTRAMUSCULAR | Status: AC
Start: 2016-07-09 — End: 2016-07-09
  Administered 2016-07-09: 2 mg via INTRAVENOUS
  Filled 2016-07-09: qty 1

## 2016-07-09 MED ORDER — MORPHINE SULFATE (PF) 4 MG/ML IV SOLN
INTRAVENOUS | Status: AC
  Filled 2016-07-09: qty 1

## 2016-07-09 MED ORDER — MORPHINE SULFATE 4 MG/ML IJ SOLN
2.0000 mg | Freq: Once | INTRAMUSCULAR | Status: AC
  Administered 2016-07-09: 2 mg via INTRAVENOUS
  Filled 2016-07-09: qty 1

## 2016-07-09 MED ORDER — SODIUM CHLORIDE 0.9 % IV SOLN: INTRAVENOUS | Status: DC

## 2016-07-09 MED ORDER — SODIUM CHLORIDE 0.9 % IJ SOLN
10.0000 mL | INTRAMUSCULAR | Status: DC | PRN
  Administered 2016-07-09: 10 mL via INTRAVENOUS
  Filled 2016-07-09: qty 10

## 2016-07-09 MED ORDER — MORPHINE SULFATE 10 MG/ML IJ SOLN: 2.0000 mg | INTRAMUSCULAR | Status: DC | PRN

## 2016-07-09 MED ORDER — MORPHINE SULFATE (PF) 4 MG/ML IV SOLN
INTRAVENOUS | Status: AC
Start: 2016-07-09 — End: 2016-07-09
  Filled 2016-07-09: qty 1

## 2016-07-10 ENCOUNTER — Telehealth: Payer: Self-pay | Admitting: Internal Medicine

## 2016-07-10 NOTE — Telephone Encounter (Signed)
° ° ° °  Pt sister call to say she passed away yesterday .

## 2016-07-13 ENCOUNTER — Other Ambulatory Visit: Payer: Self-pay | Admitting: Nurse Practitioner

## 2016-07-13 ENCOUNTER — Encounter: Payer: Self-pay | Admitting: General Practice

## 2016-07-13 NOTE — Progress Notes (Signed)
[place] Spiritual Care Note  Mailed sympathy cards from [facility] staff in care of [facility], daughter Joanne Chars and son Shambria Camerer.   [place], [place], [facility] Pager [phone] Voicemail [phone]

## 2016-07-14 ENCOUNTER — Ambulatory Visit: Payer: Medicare Other | Admitting: Oncology

## 2016-07-14 ENCOUNTER — Telehealth: Payer: Self-pay | Admitting: Medical Oncology

## 2016-07-14 NOTE — Telephone Encounter (Signed)
Daughter provided service arrangements for Anne [name] . The service will be April 22 at 2 pm at [facility] and [facility] on [facility].. She is encouraging everyone to wear red as that was her color for strength.

## 2016-07-28 NOTE — Progress Notes (Signed)
[place] Spiritual Care Note  Responded to call from [facility] [place]/RN for emotional support as pt experienced a sudden status change and family was called. Provided pastoral presence, prayer shawl, and logistical support to pt's family (dtr [name] and her husband and FIL). Pt's son Jeneen Rinks is en route from [place]. Pt's sister will be coming ASAP also.  Family very grateful for RN/MD support.   At this time, family is coping well and primarily needs time together with Manuela Schwartz for goodbyes. Family and staff aware of ongoing chaplain availability; please page as needed.Thank you.   [place], [place], [facility] Pager [phone] Voicemail [phone]

## 2016-07-28 NOTE — Telephone Encounter (Signed)
"  Romie Levee [phone]) calling for my sister.  She saw Ned Card yesterday was given things to help her bowels but she can't take them.  When she tries she throws up.  Told me she's thrown up all night.  Looks like grey bile.  I think she needs to come in, be seen and be admitted.  Call the home to let us know what to do so I can call her daughter at work to get her there at 9:30 or later."

## 2016-07-28 NOTE — Telephone Encounter (Signed)
Close encounter 

## 2016-07-28 NOTE — Progress Notes (Signed)
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 She has persistent nausea/vomiting, abdominal pain, and obstipation. Her oral intake is limited. She presents today with her daughter.  Objective:  Vital signs in last 24 hours:  Blood pressure (!) 51/28, pulse 82, resp. rate (!) 0, height '5\' 4"'$  (1.626 m), weight 106 lb (48.1 kg), SpO2 (!) 57 %.    HEENT: The mouth is dry Resp: Lungs clear anteriorly Cardio: Regular rate and rhythm GI: Distended, diffuse diffusely tender Vascular: No leg edema Neuro: Alert, follows commands   Portacath/PICC-without erythema  Lab Results:  Lab Results  Component Value Date   WBC 12.7 (H) 07/03/2016   HGB 13.3 07/03/2016   HCT 39.0 07/03/2016   MCV 84.7 07/03/2016   PLT 607 (H) 07/03/2016   NEUTROABS 10.9 (H) 07/03/2016     Medications: I have reviewed the patient's current medications.  Assessment/Plan: 1.Metastatic rectal cancer diagnosed July 2014. MSI stable on the March 2015 resection specimen. Foundation 1 Testing confirmed a KRAS G13 mutation, APC mutation, and BRIP1 mutation.  Colonoscopy on 10/25/2012 showed a bulky circumferential friable mass lesion in the rectum at approximately 4 cm above the dentate line. The mass was subtotally obstructing and would not permit passage of the colonoscope proximal. Biopsy showed adenocarcinoma.   Staging CT scans 10/25/2012 showed a right lower lobe lung mass measuring 5 x 4.6 cm; 0.8 cm nodule right upper lobe; masslike rectosigmoid wall thickening, surrounding stranding and adjacent lymphadenopathy.   PET scan 11/02/2012 showed hypermetabolism in the area of apparent circumferential rectal wall thickening; the 5 cm right lower lobe pulmonary mass was hypermetabolic with SUV max equal 8. No other hypermetabolic lung lesions were evident. Small bilateral pulmonary parenchymal nodules were noted.    She completed 5 cycles of FOLFOX chemotherapy 11/22/2012 through 02/13/2013.   Status post right thoracotomy, bilobectomy (right lower and middle lobectomy) with en bloc resection of the diaphragm 02/27/2013. Pathology showed adenocarcinoma consistent with metastatic colorectal adenocarcinoma, 4.3 cm, negative margins, no lymph node involvement. Negative soft tissue biopsy of the diaphragm x2.Positive for K-ras mutation-codon 13   She completed concurrent radiation and Xeloda for treatment of the rectal tumor 03/06/2013 through 04/11/2013.   Status post low anterior resection with a diverting loop ileostomy on 05/31/2013 at [facility] in [place]. Final pathology showed moderately differentiated adenocarcinoma located in the upper rectum; the tumor measured 2.5 x 2.2 cm; there was invasion through the muscularis propria into pericolic adipose tissue; tumor budding was present. Lymphatic status negative; vascular status negative; proximal and distal surgical margins negative; 2 of 17 lymph nodes showed metastatic adenocarcinoma. Mesenteric tumor deposits were present. Perineural invasion present. [facility] an additional positive [facility] node and the tumor was staged as a T4a   CT abdomen/pelvis 05/12/2013. Metastatic nodules in the lung increased. Chronic loculated right base pneumothorax. Diffuse mucosal thickening of the sigmoid portion of the colon.   Restaging CTs 07/25/2013 with enlargement of bilateral lung nodules, no other evidence of metastatic disease   Ileostomy takedown 08/14/2013   Chest CT 10/02/2013 revealed enlargement of bilateral lung nodules and increased right pleural fluid   Cycle 1 of FOLFIRI/Avastin 10/03/2013   Cycle 6 of FOLFIRI/Avastin 12/12/2013   Restaging CT 12/22/2013 with a decrease in the size of bilateral lung nodules, no evidence of disease progression   Cycle 7 FOLFIRI/Avastin 12/25/2013  Cycle 8 FOLFIRI/Avastin  01/10/2014  Cycle 9 FOLFIRI/Avastin 01/24/2014  Cycle 10 FOLFIRI/Avastin 02/07/2014  Cycle of 11 FOLFIRI/Avastin 02/21/2014  Restaging CT 03/05/2014 with a decrease in the size of 2 dominant right lung nodules, no new nodules, new pleural gas collection at the right apex  Cycle 12 FOLFIRI/Avastin 03/07/2014  Cycle 13 FOLFIRI/Avastin 03/20/2014  Cycle 14 FOLFIRI/Avastin 04/04/2014  Initiation of maintenance Xeloda/Avastin 04/18/2014  CT chest 07/09/2014 revealed slight enlargement of lung nodules  Treatment with single agent Avastin 07/10/2014  Resumption of FOLFIRI/Avastin on a 2 week schedule 07/25/2014  CT angio chest 09/14/2014 with essentially stable multifocal pulmonary metastatic disease.  5-fluorouracil dose reduced beginning 09/19/2014  FOLFIRI/Avastin switch to a 3 week schedule beginning with treatment 10/10/2014 secondary to malaise, diarrhea, and mucositis, irinotecan dose reduced  CT 12/26/2014 confirmed an increase in the size of bilateral lung nodules  FOLFOX/Avastin initiated on a 2 week schedule 01/02/2015  CT chest 03/20/2015 with slight enlargement of multiple lung nodules  Enrollment on an immunotherapy trial at [facility] March 2017-treatment began with Nivolumab, Ipilumumab, and cobimetinib 06/11/2015  Treatment placed on hold due to a skin rash 09/30/2015  Restaging CT scans 10/28/2015 with interval increase in the size of multiple bilateral pulmonary nodules and masses, increased size of right adrenal mass  Treatment resumed 10/29/2015  Restaging CTs at [facility] 12/11/2015-stable lung nodules/masses, 2 new liver lesions suspicious for metastases, stable adrenal mass   Taken off of the [facility] study secondary to disease progression  Cycle 1 Lonsurf 01/13/2016  Cycle 2 Lonsurf11/20/2017  Cycle 3Lonsurf 03/25/2016   CT chest 04/08/2016-enlargement of lung metastases, a few new less than 1 cm metastases, new and enlarging liver lesions, enlarging right  adrenal metastasis   CT abdomen/pelvis 07/03/2016-increase in size of pulmonary metastasis in the lung bases. Interval development of diffuse peritoneal carcinomatosis, multiple liver metastases, pelvic and retroperitoneal metastatic lymphadenopathy. Moderate distention of the colon with large volume of stool.  2. Status post right VATS, drainage of pleural effusion, visceral and parietal pleural decortication 12/21/2012. Pleural fluid culture positive for viridans Streptococcus. Right pleura biopsies negative for malignancy. Findings favored empyema. 3. Diabetes. 4. Frequent bowel movements, diminished pelvic muscle tone-we made a referral to the pelvic physical therapy clinic 5. Pain in the left leg with tenderness at the left calf and left ischium-MRI of the lumbar spine and pelvis on 10/13/2011 with no evidence of metastatic disease. A right iliac bone "lesion" was felt to most likely represent heterogenous marrow. Edema and enhancement was noted at the left sciatic nerve compared to the right side with no mass lesion 6. Hypertension-on HCTZ 7. Left foot fracture 8. BRIP1 mutation 9. Mucositis most likely related to 5-fluorouracil. Dose reduction of 5 fluorouracil beginning 09/19/2014. 10. Erythematous rash-nivolumab,ipilumumab, or cobimetinib 11. 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 Admission 11/11/2015 with a cecal volvulus-spontaneously resolved 13. 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 Her performance status had declined significantly over the past week. We planned hospital admission for comfort care measures. I discussed CPR and ACLS issues with Dr. [name] and her daughter. She agreed to a no CODE BLUE status. She was given morphine for abdominal pain. Prior to transfer to the floor she became unresponsive. The pupils were dilated and minimally responsive. Her respirations  were irregular.  We contacted her daughter and other family members with an update on her condition. It appeared that she would die in minutes to hours. The family agreed with no aggressive interventions. Her respirations  became agonal and she was pronounced dead at approximately 2:15 PM on 07-24-16.  Anne Coder, MD  07/24/2016  5:00 PM

## 2016-07-28 NOTE — Telephone Encounter (Signed)
Called patient to inform her of next scheduled appointment for 4.17.18. Spoke with the sister of the patient, the patient is currently hospitalized.

## 2016-07-28 NOTE — Progress Notes (Signed)
1150  Called to pt's room per [name], LPN. Per Sara-pt suddenly became unresponsive to verbal and noxious stimuli, arms rigid, respirations increased in rate and irregular. Unable to palpate pulse or obtain BP. [name], NP and Dr. Benay Spice notified immediately and at bedside. No family at bedside at this time  Pt is a DNR and documented as such.  [place] fixed and dilated. + corneal reflex. No response to verbal/noxious stimuli. Resp effort irregular, no peripheral pulses palpable but + femoral pulse. Dr. Benay Spice examined pt, acknowledged DNR status and notified family of sudden event. Monitored VS (see flowsheet). Both son and daughter agreed with DNR status and comfort measures. Daughter, [name] arrived by 12:15-12:20. Chaplain, [name], notified and up to provide additional support. [name] was also able to contact pt's sister, [name].  [name] and father-in-law also arrived shortly after [name]'s arrival.  Provide emotional support to family. Pt appears comfortable. Max BP obtained was 52/28.  Resp effort gradually weakening  Pupils slightly reactive @ one point but then reverted back to non-reactive. + corneal reflex. Remains unresponsive to all stimuli.  54 Son from [place] arrived and at bedside with rest of family. All memebers of family understand current situation and acknowledge that pt will not survive much longer.  Emotional support provided.  1415 No further resp effort. No apical heart sounds. Time of death 33 per Dr. Benay Spice and this writer.  Family tearful but able to make decisions for funeral home. [name] called [name] and [name] to notify them of the need for their services.  Pt's son and daughter removed pt's multi-colored ring from pt's right ring finger, gold and silver colored watch from left wrist and earrings prior to funeral home picking up body.  This Financial trader of situation. No need to transport to morgue.  Funeral home can pick up body from cancer center.   1445 This Probation officer called funeral home, spoke with [name]. Pt information provided as well as instructions as far as where they need to come and pick up body.  Preparations made to protect pt privacy and other pts at cancer center during transfer from cancer center room to funeral home vehicle.  Family expressed much gratitude for care and concern provided to pt and themselves.

## 2016-07-28 NOTE — Progress Notes (Signed)
1145am:  Writer alerted to Exam room 24 to assist patient with repositioning for pain management. Patient alert, oriented, verbalized pain as an 8 on numerical scale. Assisted patient with pillow under lumbar in recliner. Extended recliner to supine position. Patient became unresponsive mid sentence. Called from room for RN's to come and assess patient. Unable to palpitate radial pulse, respirations 24 bpm, unresponsive to verbal or noxious stimuli. [name], NP; Drucie Ip, RN; Rosalio Macadamia, RN reported to assess patient.

## 2016-07-28 NOTE — Progress Notes (Signed)
[place] Work  Clinical Social Work was referred by nurse for assessment of psychosocial needs due to pt's decline.  Clinical Social Worker met briefly with daughter, [name] to provide support. Daughter reports family is on their way to see patient. CSW available to staff and family for continued support as needed.     Loren Racer, LCSW, OSW-C Clinical Social Worker [place]  [place] Phone: [phone] Fax: [phone]

## 2016-07-28 NOTE — Telephone Encounter (Signed)
Returned call to pt's sister. Instructed her to bring pt in now. She reports [name] will be there to bring her in as soon as possible.

## 2016-07-28 DEATH — deceased
